# Patient Record
Sex: Male | Born: 1940
Health system: Southern US, Community
[De-identification: ages and names within clinical notes are randomized; demographics above are authoritative.]

## PROBLEM LIST (undated history)

## (undated) DIAGNOSIS — Z87442 Personal history of urinary calculi: Secondary | ICD-10-CM

## (undated) DIAGNOSIS — H269 Unspecified cataract: Secondary | ICD-10-CM

## (undated) DIAGNOSIS — M810 Age-related osteoporosis without current pathological fracture: Secondary | ICD-10-CM

## (undated) DIAGNOSIS — E785 Hyperlipidemia, unspecified: Secondary | ICD-10-CM

## (undated) DIAGNOSIS — D649 Anemia, unspecified: Secondary | ICD-10-CM

## (undated) DIAGNOSIS — R21 Rash and other nonspecific skin eruption: Secondary | ICD-10-CM

## (undated) DIAGNOSIS — S60412A Abrasion of right middle finger, initial encounter: Secondary | ICD-10-CM

## (undated) DIAGNOSIS — M1990 Unspecified osteoarthritis, unspecified site: Secondary | ICD-10-CM

## (undated) DIAGNOSIS — I10 Essential (primary) hypertension: Secondary | ICD-10-CM

## (undated) DIAGNOSIS — K219 Gastro-esophageal reflux disease without esophagitis: Secondary | ICD-10-CM

## (undated) DIAGNOSIS — T7840XA Allergy, unspecified, initial encounter: Secondary | ICD-10-CM

## (undated) DIAGNOSIS — H409 Unspecified glaucoma: Secondary | ICD-10-CM

## (undated) DIAGNOSIS — L089 Local infection of the skin and subcutaneous tissue, unspecified: Secondary | ICD-10-CM

## (undated) DIAGNOSIS — E119 Type 2 diabetes mellitus without complications: Secondary | ICD-10-CM

## (undated) DIAGNOSIS — N189 Chronic kidney disease, unspecified: Secondary | ICD-10-CM

## (undated) HISTORY — DX: Age-related osteoporosis without current pathological fracture: M81.0

## (undated) HISTORY — DX: Type 2 diabetes mellitus without complications: E11.9

## (undated) HISTORY — DX: Unspecified glaucoma: H40.9

## (undated) HISTORY — PX: APPENDECTOMY: SHX54

## (undated) HISTORY — PX: CYSTOSCOPY/RETROGRADE/URETEROSCOPY/STONE EXTRACTION WITH BASKET: SHX5317

## (undated) HISTORY — PX: EYE SURGERY: SHX253

## (undated) HISTORY — DX: Allergy, unspecified, initial encounter: T78.40XA

## (undated) HISTORY — DX: Unspecified cataract: H26.9

## (undated) HISTORY — DX: Essential (primary) hypertension: I10

---

## 2008-12-21 ENCOUNTER — Ambulatory Visit: Payer: Self-pay | Admitting: Family Medicine

## 2008-12-21 DIAGNOSIS — Z794 Long term (current) use of insulin: Secondary | ICD-10-CM

## 2008-12-21 DIAGNOSIS — E1165 Type 2 diabetes mellitus with hyperglycemia: Secondary | ICD-10-CM | POA: Insufficient documentation

## 2008-12-21 DIAGNOSIS — IMO0002 Reserved for concepts with insufficient information to code with codable children: Secondary | ICD-10-CM | POA: Insufficient documentation

## 2008-12-21 LAB — CONVERTED CEMR LAB
Basophils Relative: 1 % (ref 0–1)
Bilirubin, Direct: 0.1 mg/dL (ref 0.0–0.3)
CO2: 28 meq/L (ref 19–32)
Calcium: 9.6 mg/dL (ref 8.4–10.5)
Chloride: 104 meq/L (ref 96–112)
Glucose, Bld: 78 mg/dL (ref 70–99)
Hemoglobin: 14.2 g/dL (ref 13.0–17.0)
Hgb A1c MFr Bld: 9.7 %
LDL Cholesterol: 77 mg/dL (ref 0–99)
MCHC: 32.6 g/dL (ref 30.0–36.0)
Monocytes Absolute: 0.4 10*3/uL (ref 0.1–1.0)
Monocytes Relative: 10 % (ref 3–12)
Neutro Abs: 1.4 10*3/uL — ABNORMAL LOW (ref 1.7–7.7)
PSA: 0.66 ng/mL (ref 0.10–4.00)
Potassium: 4.6 meq/L (ref 3.5–5.3)
RBC: 4.84 M/uL (ref 4.22–5.81)
Sodium: 143 meq/L (ref 135–145)
TSH: 1.573 microintl units/mL (ref 0.350–4.500)
Total Bilirubin: 0.5 mg/dL (ref 0.3–1.2)
Total CHOL/HDL Ratio: 2.5
VLDL: 14 mg/dL (ref 0–40)

## 2008-12-23 ENCOUNTER — Encounter: Payer: Self-pay | Admitting: Family Medicine

## 2009-01-02 DIAGNOSIS — I10 Essential (primary) hypertension: Secondary | ICD-10-CM | POA: Insufficient documentation

## 2009-01-20 ENCOUNTER — Ambulatory Visit: Payer: Self-pay | Admitting: Family Medicine

## 2009-01-20 DIAGNOSIS — N529 Male erectile dysfunction, unspecified: Secondary | ICD-10-CM | POA: Insufficient documentation

## 2009-01-20 HISTORY — DX: Male erectile dysfunction, unspecified: N52.9

## 2009-01-20 LAB — CONVERTED CEMR LAB: Glucose, Bld: 238 mg/dL

## 2009-02-02 ENCOUNTER — Telehealth: Payer: Self-pay | Admitting: Family Medicine

## 2009-02-10 ENCOUNTER — Encounter: Payer: Self-pay | Admitting: Family Medicine

## 2009-07-19 ENCOUNTER — Ambulatory Visit: Payer: Self-pay | Admitting: Family Medicine

## 2009-07-19 DIAGNOSIS — M79609 Pain in unspecified limb: Secondary | ICD-10-CM | POA: Insufficient documentation

## 2009-07-19 LAB — CONVERTED CEMR LAB
Blood Glucose, Fasting: 148 mg/dL
OCCULT 1: NEGATIVE

## 2009-07-20 DIAGNOSIS — H6120 Impacted cerumen, unspecified ear: Secondary | ICD-10-CM | POA: Insufficient documentation

## 2009-07-20 LAB — CONVERTED CEMR LAB
BUN: 15 mg/dL (ref 6–23)
Creatinine, Urine: 121 mg/dL
Hgb A1c MFr Bld: 9.3 % — ABNORMAL HIGH (ref 4.6–6.1)
Microalb Creat Ratio: 17.1 mg/g (ref 0.0–30.0)
Microalb, Ur: 2.07 mg/dL — ABNORMAL HIGH (ref 0.00–1.89)
Potassium: 5 meq/L (ref 3.5–5.3)
Sodium: 138 meq/L (ref 135–145)

## 2009-08-05 ENCOUNTER — Ambulatory Visit: Payer: Self-pay | Admitting: Orthopedic Surgery

## 2009-08-05 DIAGNOSIS — G5600 Carpal tunnel syndrome, unspecified upper limb: Secondary | ICD-10-CM

## 2009-08-05 HISTORY — DX: Carpal tunnel syndrome, unspecified upper limb: G56.00

## 2009-08-27 ENCOUNTER — Ambulatory Visit: Payer: Self-pay | Admitting: Family Medicine

## 2009-10-04 ENCOUNTER — Ambulatory Visit: Payer: Self-pay | Admitting: Orthopedic Surgery

## 2009-10-08 ENCOUNTER — Encounter (INDEPENDENT_AMBULATORY_CARE_PROVIDER_SITE_OTHER): Payer: Self-pay | Admitting: *Deleted

## 2009-10-27 ENCOUNTER — Ambulatory Visit: Payer: Self-pay | Admitting: Family Medicine

## 2009-10-29 LAB — CONVERTED CEMR LAB
CO2: 27 meq/L (ref 19–32)
Calcium: 9.3 mg/dL (ref 8.4–10.5)
Chloride: 101 meq/L (ref 96–112)
Potassium: 4.8 meq/L (ref 3.5–5.3)
Sodium: 138 meq/L (ref 135–145)

## 2009-11-03 ENCOUNTER — Encounter: Payer: Self-pay | Admitting: Orthopedic Surgery

## 2009-11-09 ENCOUNTER — Encounter: Payer: Self-pay | Admitting: Family Medicine

## 2009-11-10 LAB — CONVERTED CEMR LAB
BUN: 23 mg/dL (ref 6–23)
CO2: 28 meq/L (ref 19–32)
Chloride: 101 meq/L (ref 96–112)
Potassium: 4.7 meq/L (ref 3.5–5.3)

## 2009-11-24 ENCOUNTER — Ambulatory Visit: Payer: Self-pay | Admitting: Orthopedic Surgery

## 2010-01-21 ENCOUNTER — Ambulatory Visit: Payer: Self-pay | Admitting: Family Medicine

## 2010-01-24 ENCOUNTER — Encounter: Payer: Self-pay | Admitting: Family Medicine

## 2010-01-31 ENCOUNTER — Encounter: Payer: Self-pay | Admitting: Family Medicine

## 2010-01-31 LAB — CONVERTED CEMR LAB
BUN: 24 mg/dL — ABNORMAL HIGH (ref 6–23)
Chloride: 101 meq/L (ref 96–112)
Glucose, Bld: 120 mg/dL — ABNORMAL HIGH (ref 70–99)
Hgb A1c MFr Bld: 8.5 % — ABNORMAL HIGH (ref <5.7)
Potassium: 4.5 meq/L (ref 3.5–5.3)

## 2010-03-29 ENCOUNTER — Telehealth: Payer: Self-pay | Admitting: Family Medicine

## 2010-05-10 NOTE — Assessment & Plan Note (Signed)
Summary: F UP   Vital Signs:  Patient profile:   70 year old male Height:      66 inches Weight:      128.25 pounds BMI:     20.77 O2 Sat:      98 % Pulse rate:   73 / minute Pulse rhythm:   regular Resp:     16 per minute BP sitting:   114 / 78  (left arm) BP standing:   120 / 80  (right arm) Cuff size:   regular  Vitals Entered By: Everitt Amber LPN (October 27, 2009 10:57 AM) CC: Follow up chronic problems   Primary Care Provider:  Dr. Lodema Hong  CC:  Follow up chronic problems.  History of Present Illness: Reports  that he has been   doing well. Denies recent fever or chills. Denies sinus pressure, nasal congestion , ear pain or sore throat. Denies chest congestion, or cough productive of sputum. Denies chest pain, palpitations, PND, orthopnea or leg swelling. Denies abdominal pain, nausea, vomitting, diarrhea or constipation. Denies change in bowel movements or bloody stool. Denies dysuria , frequency, incontinence or hesitancy. Denies  joint pain, swelling, or reduced mobility. Denies headaches, vertigo, seizures. Denies depression, anxiety or insomnia. Denies  rash, lesions, or itch.     Allergies (verified): No Known Drug Allergies  Review of Systems      See HPI Eyes:  Denies discharge, eye pain, and red eye. Endo:  Denies cold intolerance, excessive hunger, excessive thirst, excessive urination, heat intolerance, polyuria, and weight change; tests twice daily mornings range 70 to 140, evening sugar between 200 to 220, this is premeal. Heme:  Denies abnormal bruising and bleeding. Allergy:  Denies hives or rash and itching eyes.  Physical Exam  General:  Well-developed,well-nourished,in no acute distress; alert,appropriate and cooperative throughout examination. HEENT: No facial asymmetry,  EOMI, No sinus tenderness, TM's clear, oropharynx  pink and moist.   Chest: Clear to auscultation bilaterally.  CVS: S1, S2, No murmurs, No S3.   Abd: Soft, Nontender.    MS: Adequate ROM spine, hips, shoulders and knees.  Ext: No edema.   CNS: CN 2-12 intact, power tone and sensation normal throughout.   Skin: Intact, no visible lesions or rashes.  Psych: Good eye contact, normal affect.  Memory intact, not anxious or depressed appearing.    Impression & Recommendations:  Problem # 1:  HYPERTENSION (ICD-401.9) Assessment Unchanged  His updated medication list for this problem includes:    Lotensin 10 Mg Tabs (Benazepril hcl) .Marland Kitchen... Take 1 tablet by mouth once a day  BP today: 114/78 Prior BP: 120/78 (08/27/2009)  Labs Reviewed: K+: 5.0 (07/19/2009) Creat: : 1.15 (07/19/2009)   Chol: 151 (12/21/2008)   HDL: 60 (12/21/2008)   LDL: 77 (12/21/2008)   TG: 71 (12/21/2008)  Problem # 2:  DIABETES MELLITUS, TYPE II (ICD-250.00) Assessment: Comment Only  The following medications were removed from the medication list:    Metformin Hcl 750 Mg Xr24h-tab (Metformin hcl) .Marland Kitchen..Marland Kitchen Two tablets once daily His updated medication list for this problem includes:    Lotensin 10 Mg Tabs (Benazepril hcl) .Marland Kitchen... Take 1 tablet by mouth once a day    Humulin 70/30 70-30 % Susp (Insulin isophane & regular) .Marland Kitchen... 20 units in the morning, and 20 units in the evening    Aspir-low 81 Mg Tbec (Aspirin) .Marland Kitchen... Take 1 tablet by mouth once a day    Glipizide 10 Mg Xr24h-tab (Glipizide) .Marland Kitchen... Take 1 tablet by mouth once  a day    Glipizide 5 Mg Xr24h-tab (Glipizide) .Marland Kitchen... Take 1 tablet by mouth once a day take at the same time in themorning that you take the 10mg  tablet  Orders: T- Hemoglobin A1C ([id number])  Labs Reviewed: Creat: 1.15 (07/19/2009)     Last Eye Exam: glaucoma (03/31/2009) Reviewed HgBA1c results: 9.3 (07/19/2009)  9.7 (12/21/2008)  Problem # 3:  CERUMEN IMPACTION, BILATERAL (ICD-380.4) Assessment: Improved  Complete Medication List: 1)  Lotensin 10 Mg Tabs (Benazepril hcl) .... Take 1 tablet by mouth once a day 2)  Humulin 70/30 70-30 % Susp (Insulin  isophane & regular) .... 20 units in the morning, and 20 units in the evening 3)  Aspir-low 81 Mg Tbec (Aspirin) .... Take 1 tablet by mouth once a day 4)  Cialis 20 Mg Tabs (Tadalafil) .... One tab by mouth qd 5)  Glipizide 10 Mg Xr24h-tab (Glipizide) .... Take 1 tablet by mouth once a day 6)  Neurontin 100 Mg Caps (Gabapentin) .Marland Kitchen.. 1 by mouth tid 7)  Glipizide 5 Mg Xr24h-tab (Glipizide) .... Take 1 tablet by mouth once a day take at the same time in themorning that you take the 10mg  tablet  Other Orders: T-Basic Metabolic Panel [id number])  Patient Instructions: 1)  Please schedule a follow-up appointment in 3 months. 2)  Check your blood sugars regularly. If your readings are usually above 250 or below 70 you should contact our office. 3)  HBA1C and chem 7 today. 4)  No med changes Prescriptions: GLIPIZIDE 5 MG XR24H-TAB (GLIPIZIDE) Take 1 tablet by mouth once a day take at the same time in themorning that you take the 10mg  tablet  #30 x 3   Entered and Authorized by:   Syliva Overman MD   Signed by:   Syliva Overman MD on 10/29/2009   Method used:   Printed then faxed to ...       [facility] (retail)       [address]       [place], [name]  [address]       Ph: [phone]       Fax: [phone]   RxID:   [id number]

## 2010-05-10 NOTE — Assessment & Plan Note (Signed)
Summary: office visit   Vital Signs:  Patient profile:   70 year old male Height:      66 inches Weight:      137.25 pounds BMI:     22.23 O2 Sat:      99 % on Room air Pulse rate:   85 / minute Pulse rhythm:   regular Resp:     16 per minute BP sitting:   134 / 80  (left arm) Cuff size:   regular  Vitals Entered By: Mauricia Area CMA (January 21, 2010 11:48 AM)  O2 Flow:  Room air CC: follow up   Primary Care Thien Berka:  Dr. Lodema Hong  CC:  follow up.  History of Present Illness: Reports  that he has been doing well. Denies recent fever or chills. Denies sinus pressure, nasal congestion , ear pain or sore throat. Denies chest congestion, or cough productive of sputum. Denies chest pain, palpitations, PND, orthopnea or leg swelling. Denies abdominal pain, nausea, vomitting, diarrhea or constipation. Denies change in bowel movements or bloody stool. Denies dysuria , frequency, incontinence or hesitancy. Denies  joint pain, swelling, or reduced mobility. Denies headaches, vertigo, seizures. Denies depression, anxiety or insomnia. Denies  rash, lesions, or itch. pt denies symptoms of uncontrolled blod sugars. He states he has been testing more frequenly also.He aLSO REFUSES  the flu vacine.      Allergies (verified): No Known Drug Allergies  Review of Systems      See HPI Eyes:  Denies discharge, eye pain, and red eye. Endo:  Denies excessive thirst and excessive urination; pt neither has his log or meter, and the reliability of his history of tests is [place] questionable. Heme:  Denies abnormal bruising and bleeding. Allergy:  Denies hives or rash and itching eyes.  Physical Exam  General:  Well-developed,well-nourished,in no acute distress; alert,appropriate and cooperative throughout examination HEENT: No facial asymmetry,  EOMI, No sinus tenderness, TM's Clear, oropharynx  pink and moist.   Chest: Clear to auscultation bilaterally.  CVS: S1, S2, No murmurs, No S3.    Abd: Soft, Nontender.  MS: Adequate ROM spine, hips, shoulders and knees.  Ext: No edema.   CNS: CN 2-12 intact, power tone and sensation normal throughout.   Skin: Intact, no visible lesions or rashes.  Psych: Good eye contact, normal affect.  Memory intact, not anxious or depressed appearing.     Impression & Recommendations:  Problem # 1:  ERECTILE DYSFUNCTION, ORGANIC (ICD-607.84) Assessment Unchanged  His updated medication list for this problem includes:    Cialis 20 Mg Tabs (Tadalafil) ..... One tab by mouth once daily.  Problem # 2:  HYPERTENSION (ICD-401.9) Assessment: Unchanged  His updated medication list for this problem includes:    Lotensin 10 Mg Tabs (Benazepril hcl) .Marland Kitchen... Take 1 tablet by mouth once a day  Orders: T-Basic Metabolic Panel ([id number])  BP today: 134/80 Prior BP: 120/80 (10/27/2009)  Labs Reviewed: K+: 4.7 (11/09/2009) Creat: : 1.63 (11/09/2009)   Chol: 151 (12/21/2008)   HDL: 60 (12/21/2008)   LDL: 77 (12/21/2008)   TG: 71 (12/21/2008)  Problem # 3:  DIABETES MELLITUS, TYPE II (ICD-250.00) Assessment: Unchanged  His updated medication list for this problem includes:    Lotensin 10 Mg Tabs (Benazepril hcl) .Marland Kitchen... Take 1 tablet by mouth once a day    Humulin 70/30 70-30 % Susp (Insulin isophane & regular) .Marland Kitchen... 20 units in the morning, and 20 units in the evening    Aspir-low 81 Mg Tbec (Aspirin) .Marland KitchenMarland KitchenMarland KitchenMarland Kitchen  Take 1 tablet by mouth once a day    Glipizide 10 Mg Xr24h-tab (Glipizide) .Marland Kitchen... Take 1 tablet by mouth once a day    Glipizide 5 Mg Xr24h-tab (Glipizide) .Marland Kitchen... Take 1 tablet by mouth once a day take at the same time in themorning that you take the 10mg  tablet Patient advised to reduce carbs and sweets, commit to regular physical activity, take meds as prescribed, test blood sugars as directed, and attempt to lose weight , to improve blood sugar control.  Orders: T- Hemoglobin A1C ([id number])  Labs Reviewed: Creat: 1.63 (11/09/2009)       Last Eye Exam: glaucoma (03/31/2009) Reviewed HgBA1c results: 8.3 (10/27/2009)  9.3 (07/19/2009)  Complete Medication List: 1)  Lotensin 10 Mg Tabs (Benazepril hcl) .... Take 1 tablet by mouth once a day 2)  Humulin 70/30 70-30 % Susp (Insulin isophane & regular) .... 20 units in the morning, and 20 units in the evening 3)  Aspir-low 81 Mg Tbec (Aspirin) .... Take 1 tablet by mouth once a day 4)  Cialis 20 Mg Tabs (Tadalafil) .... One tab by mouth once daily. 5)  Glipizide 10 Mg Xr24h-tab (Glipizide) .... Take 1 tablet by mouth once a day 6)  Neurontin 100 Mg Caps (Gabapentin) .Marland Kitchen.. 1 by mouth tid 7)  Glipizide 5 Mg Xr24h-tab (Glipizide) .... Take 1 tablet by mouth once a day take at the same time in themorning that you take the 10mg  tablet  Patient Instructions: 1)  Please schedule a follow-up appointment in 3 to 3.5 months. 2)  BMP prior to visit, ICD-9: 3)  HbgA1C prior to visit, ICD-9: 4)  Check your blood sugars regularly. If your readings are usually above 250 or below 70 you should contact our office.

## 2010-05-10 NOTE — Assessment & Plan Note (Signed)
Summary: 6 WK RE-CK RT HAND/SEC HORIZ/CAF   Visit Type:  Follow-up Referring Provider:  Dr. Lodema Hong Primary Provider:  Dr. Lodema Hong  CC:  recheck right hand.  History of Present Illness: I saw Jacob Barnes in the office today for a followup visit.  He is a 70 years old man with the complaint of: carpal tunnel symptoms in the RIGHT hand  We treated him with  Neurontin 100mg  three times a day, bracing and OTC vitamin B6 for CTS.  This is a 6 week followup  Still some numbness and swelling in rt thumb.  His main problem he says that he can't pick things up the hand just feels a little bit weak and he has pain around his thumb, he also complained about nodules which are Heberden's notes around the IP joint of the thumb which I told Be removed  I think we should go ahead and do I nerve conduction study just to make sure we are dealing with carpal tunnel syndrome and not arthritis he is agreeable to that  Recommend nerve conduction study continue bracing follow up 3 weeks     Allergies: No Known Drug Allergies   Impression & Recommendations:  Problem # 1:  CARPAL TUNNEL SYNDROME (ICD-354.0) Assessment Comment Only further testing needed Orders: Est. Patient Level II ([address])  Patient Instructions: 1)  NCS UPPER EXTREMITIES/CTS  2)  continue bracing  3)  f/u in 3 weeks

## 2010-05-10 NOTE — Letter (Signed)
Summary: med review sheet  med review sheet   Imported By: Rudene Anda 01/24/2010 16:46:30  _____________________________________________________________________  External Attachment:    Type:   Image     Comment:   External Document

## 2010-05-10 NOTE — Assessment & Plan Note (Signed)
Summary: physical   Vital Signs:  Patient profile:   70 year old male Height:      65 inches Weight:      137 pounds BMI:     22.88 O2 Sat:      97 % Pulse rate:   65 / minute Pulse rhythm:   regular Resp:     16 per minute BP sitting:   120 / 72  (left arm) Cuff size:   regular  Vitals Entered By: Everitt Amber LPN (July 19, 2009 9:01 AM) CC: states he has no feeling in his fingertips in his right side   CC:  states he has no feeling in his fingertips in his right side.  History of Present Illness: Reports  that he has been doing fairly well. He unfortunately is still eating irregularly,eating too much candy, and reports blood sugar s often over 250 Denies recent fever or chills. Denies sinus pressure, nasal congestion , ear pain or sore throat. Denies chest congestion, or cough productive of sputum. Denies chest pain, palpitations, PND, orthopnea or leg swelling. Denies abdominal pain, nausea, vomitting, diarrhea or constipation. Denies change in bowel movements or bloody stool. Denies dysuria , frequency, incontinence or hesitancy. Reports  joint pain, swelling, and  reduced mobility affecting the fingers Denies headaches, vertigo, seizures. Denies depression, anxiety or insomnia. Denies  rash,  or itch.Recent trauma to thumb, nail now black and blue. He c/o numbness in the fingers of his right hand with very poor grip, and reduced strength in his thumb, states he is having difficulty with tying shoe laces.     Current Medications (verified): 1)  Lotensin 10 Mg Tabs (Benazepril Hcl) .... Take 1 Tablet By Mouth Once A Day 2)  Humulin 70/30 70-30 % Susp (Insulin Isophane & Regular) .... 20 Units in The Morning, and 20 Units in The Evening 3)  Glucotrol Xl 5 Mg Xr24h-Tab (Glipizide) .... Take One Tab Every Morning Before Breakfast 4)  Aspir-Low 81 Mg Tbec (Aspirin) .... Take 1 Tablet By Mouth Once A Day 5)  Cialis 20 Mg Tabs (Tadalafil) .... One Tab By Mouth  Qd  Allergies (verified): No Known Drug Allergies  Past History:  Past medical, surgical, family and social histories (including risk factors) reviewed, and no changes noted (except as noted below).  Past Medical History: hypertension  since 2008 Diabetes x 1979 Glaucom  in right eye since 2009  Past Surgical History: Reviewed history from 12/21/2008 and no changes required. Appendectomy-age 1 bilateral cataract surgery 2000  Family History: Reviewed history from 12/21/2008 and no changes required. Mother-deceased in 03-22-2009 at age 41 Father-deceased-Diabetic 1 sisters-healthy 2 brothers-Diabetic  Social History: Reviewed history from 12/21/2008 and no changes required. 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 Eyes:  Denies blurring, discharge, eye pain, and red eye. ENT:  Complains of decreased hearing. MS:  Complains of joint pain, muscle weakness, and stiffness; weak right griop  and swollen joint on right thumb. Derm:  Complains of changes in nail beds, dryness, insect bite(s), itching, lesion(s), and poor wound healing; 2 weeks ago squashed right 5th finger in a stove nowe black and blue , swollen, non tender no drainage puklled off ticjk on right leg yesterday area now red.  Neuro:  Complains of numbness; progressive numbness in fingers of right hand with joint deformity of the thumb, cannot tie shoes for approx 1 year, pos tinnels. Psych:  Denies anxiety and easily angered. Endo:  Complains of excessive thirst and excessive urination; uncontrolled blood sugars, from 80's to opvwer 400, eating is inconsuitent, and too much sweet. Heme:  Denies abnormal bruising and bleeding. Allergy:  Complains of seasonal  allergies; denies hives or rash, itching eyes, persistent infections, and sneezing; mild.  Physical Exam  General:  Well-developed,well-nourished,in no acute distress; alert,appropriate and cooperative throughout examination Head:  Normocephalic and atraumatic without obvious abnormalities. No apparent alopecia or balding. Eyes:  vision grossly intact, pupils equal, pupils round, and pupils reactive to light.   Ears:  Bilateral cerumen impaction Nose:  no external deformity, no external erythema, and no nasal discharge.   Mouth:  pharynx pink and moist and teeth missing.   Neck:  No deformities, masses, or tenderness noted.no adenopathy or JVD Chest Wall:  No deformities, masses, tenderness or gynecomastia noted. Breasts:  No masses or gynecomastia noted Lungs:  Normal respiratory effort, chest expands symmetrically. Lungs are clear to auscultation, no crackles or wheezes. Heart:  Normal rate and regular rhythm. S1 and S2 normal without gallop, murmur, click, rub or other extra sounds. Abdomen:  Bowel sounds positive,abdomen soft and non-tender without masses, organomegaly or hernias noted. Rectal:  No external abnormalities noted. Normal sphincter tone. No rectal masses or tenderness.Guaic neg stool Genitalia:  Testes bilaterally descended without nodularity, tenderness or masses. No scrotal masses or lesions. No penis lesions or urethral discharge. Prostate:  Prostate gland firm and smooth, no enlargement, nodularity, tenderness, mass, asymmetry or induration. Msk:  No deformity or scoliosis noted of thoracic or lumbar spine.  Wasting of right thenar muscles, with positive tinnel's sign Pulses:  R and L carotid,radial,femoral,dorsalis pedis and posterior tibial pulses are full and equal bilaterally Extremities:  No clubbing, cyanosis, edema, or deformity noted with normal full range of motion of all joints. joints of fingers aenlarged and deformed esp the thumbs  Neurologic:  alert & oriented  X3 and cranial nerves II-XII intact.  Decreased power in right hand , and decreased sensation in rightr finger tips  Skin:  erythematous warm area on right leg lateral aspecrt, dia approx 3 inches  Diabetes Management Exam:    Foot Exam (with socks and/or shoes not present):       Sensory-Monofilament:          Left foot: diminished          Right foot: diminished       Inspection:          Left foot: normal          Right foot: normal       Nails:          Left foot: fungal infection          Right foot: fungal infection    Eye Exam:       Eye Exam done elsewhere          Date: 03/31/2009          Results: glaucoma          Done by: dr Onalee Hua   Impression & Recommendations:  Problem # 1:  HYPERTENSION (ICD-401.9) Assessment Unchanged  His updated medication list for this problem includes:    Lotensin 10 Mg Tabs (Benazepril hcl) .Marland Kitchen... Take 1 tablet by mouth once a day  Orders: T-Basic Metabolic  Panel [id number])  BP today: 120/72 Prior BP: 120/64 (01/20/2009)  Labs Reviewed: K+: 4.6 (12/21/2008) Creat: : 1.09 (12/21/2008)   Chol: 151 (12/21/2008)   HDL: 60 (12/21/2008)   LDL: 77 (12/21/2008)   TG: 71 (12/21/2008)  Problem # 2:  DIABETES MELLITUS, TYPE II (ICD-250.00) Assessment: Unchanged  The following medications were removed from the medication list:    Glucotrol Xl 5 Mg Xr24h-tab (Glipizide) .Marland Kitchen... Take one tab every morning before breakfast His updated medication list for this problem includes:    Lotensin 10 Mg Tabs (Benazepril hcl) .Marland Kitchen... Take 1 tablet by mouth once a day    Humulin 70/30 70-30 % Susp (Insulin isophane & regular) .Marland Kitchen... 20 units in the morning, and 20 units in the evening    Aspir-low 81 Mg Tbec (Aspirin) .Marland Kitchen... Take 1 tablet by mouth once a day    Glipizide 10 Mg Xr24h-tab (Glipizide) .Marland Kitchen... Take 1 tablet by mouth once a day    Metformin Hcl 500 Mg Xr24h-tab (Metformin hcl) .Marland Kitchen... Take 1 tablet by mouth two times a day  Orders: Glucose, (CBG)  ([id number]) T- Hemoglobin A1C ([id number]) T-Urine Microalbumin w/creat. ratio [id number])  Problem # 3:  HAND PAIN, RIGHT (ICD-729.5) Assessment: Deteriorated  Orders: Orthopedic Referral (Ortho), pt likely has a combination of diabetic neuropathy, carpal tunnel and rheumatoid arthritis  Problem # 4:  CERUMEN IMPACTION, BILATERAL (ICD-380.4) Assessment: Comment Only pt to have ear irrigation on return visit in 6 weeks, he is to use wax softener prior to the visit  Complete Medication List: 1)  Lotensin 10 Mg Tabs (Benazepril hcl) .... Take 1 tablet by mouth once a day 2)  Humulin 70/30 70-30 % Susp (Insulin isophane & regular) .... 20 units in the morning, and 20 units in the evening 3)  Aspir-low 81 Mg Tbec (Aspirin) .... Take 1 tablet by mouth once a day 4)  Cialis 20 Mg Tabs (Tadalafil) .... One tab by mouth qd 5)  Doxycycline Hyclate 100 Mg Caps (Doxycycline hyclate) .... Take 1 tablet by mouth two times a day 6)  Glipizide 10 Mg Xr24h-tab (Glipizide) .... Take 1 tablet by mouth once a day 7)  Metformin Hcl 500 Mg Xr24h-tab (Metformin hcl) .... Take 1 tablet by mouth two times a day  Other Orders: Hemoccult Guaiac-1 spec.(in office) ([id number])  Patient Instructions: 1)  F/u in 6 weeks 2)  Check your blood sugars regularly. If your readings are usually above 250 or below 70 you should contact our office.pla test twice daily and record 3)  HbgA1C prior to visit, ICD-9:  today 4)  Urine Microalbumin prior to visit, ICD-9: 5)  BMP prior to visit, ICD-9 6)  Med will be sent in for the inflamed area on your leg where you were bitten. 7)  You need to use a wax softener for5 days before your next visit, they have wax and will need to be flushed. 8)  itis vital that you reduce sweets, cake and icecream 9)  You will be referred to ortho about the numbnees and weakness in your right hand esp the thumb Prescriptions: METFORMIN HCL 500 MG XR24H-TAB (METFORMIN HCL) Take 1 tablet by mouth  two times a day  #60 x 3   Entered and Authorized by:   Syliva Overman MD   Signed by:   Syliva Overman MD on 07/20/2009   Method used:   Electronically to        [facility] (retail)       [id number]       [place], [name]  [address]       Ph: [phone]       Fax: [phone]   RxID:   [id number] GLIPIZIDE 10 MG XR24H-TAB (GLIPIZIDE) Take 1 tablet by mouth once a day  #30 x 3   Entered and Authorized by:   Syliva Overman MD   Signed by:   Syliva Overman MD on 07/20/2009   Method used:   Electronically to        [facility] (retail)       [address]       [place], [name]  [address]       Ph: [phone]       Fax: [phone]   RxID:   [id number] DOXYCYCLINE HYCLATE 100 MG CAPS (DOXYCYCLINE HYCLATE) Take 1 tablet by mouth two times a day  #14 x 0   Entered and Authorized by:   Syliva Overman MD   Signed by:   Syliva Overman MD on 07/19/2009   Method used:   Electronically to        [facility] (retail)       [address]       [place], [name]  [address]       Ph: [phone]       Fax: [phone]   RxID:   [id number]   Laboratory Results   Blood Tests     Glucose (fasting): 148 mg/dL   (Normal Range: 07-371)    Stool - Occult Blood Hemmoccult #1: negative Date: 07/19/2009 Comments: [id number] 9R 8/11 118 10 12

## 2010-05-10 NOTE — Assessment & Plan Note (Signed)
Summary: rt hand pain needs xr/sec hor/simpson/bsf   Vital Signs:  Patient profile:   70 year old male Height:      66 inches Weight:      135 pounds Pulse rate:   64 / minute Resp:     16 per minute  Vitals Entered By: Fuller Canada MD (August 05, 2009 9:45 AM)  Visit Type:  new patient Referring Provider:  Dr. Lodema Hong Primary Provider:  Dr. Lodema Hong  CC:  right hand numbness.  History of Present Illness: 70 years old African American male who complains of numbness in his RIGHT hand for 6 months.  Does not describe pain. Chest numbness. No injury. No previous treatment. Does say he has some swelling and some weakness.    Meds in EMR.  Allergies (verified): No Known Drug Allergies  Social History: Retired x 3 years , from the [employer], [employer] Married for 15 years, second marriage first wife died Never Smoked Alcohol use-no Drug use-no no caffeine use Regular exercise-yes (is physically active) three adult children and  4stepchildren  Review of Systems Constitutional:  Denies weight loss, weight gain, fever, chills, and fatigue. Cardiovascular:  Denies chest pain, palpitations, fainting, and murmurs. Respiratory:  Denies short of breath, wheezing, couch, tightness, pain on inspiration, and snoring . Gastrointestinal:  Denies heartburn, nausea, vomiting, diarrhea, constipation, and blood in your stools. Genitourinary:  Denies frequency, urgency, difficulty urinating, painful urination, flank pain, and bleeding in urine. Neurologic:  Complains of numbness and tingling; denies unsteady gait, dizziness, tremors, and seizure. Musculoskeletal:  Denies joint pain, swelling, instability, stiffness, redness, heat, and muscle pain. Endocrine:  Denies excessive thirst, exessive urination, and heat or cold intolerance. Psychiatric:  Denies nervousness, depression, anxiety, and hallucinations. Skin:  Complains of poor healing; denies changes in the skin, rash, itching,  and redness. HEENT:  Denies blurred or double vision, eye pain, redness, and watering. Immunology:  Denies seasonal allergies, sinus problems, and allergic to bee stings. Hemoatologic:  Denies easy bleeding and brusing.  Physical Exam  Additional Exam:  GEN: well developed, well nourished, normal grooming and hygiene, no deformity and normal body habitus.   CDV: pulses are normal, no edema, no erythema. no tenderness  Lymph: normal lymph nodes   Skin: no rashes, skin lesions or open sores   NEURO: soft touch and sharp touch sensation, tests are normal and the RIGHT upper extremity. He has good reflexes in that arm. He has tenderness over the carpal tunnel.  Psyche: awake, alert and oriented. Mood normal   Gait: normal   Hand is otherwise, normal. In terms of its appearance.  He has good range of motion and normal grip strength compared to his LEFT hand. There is no instability. He has a positive wrist compression test for carpal tunnel.    Impression & Recommendations:  Problem # 1:  CARPAL TUNNEL SYNDROME (ICD-354.0) Assessment New  recommend nonoperative treatment with a brace for 6 weeks, the 600 mg twice a day for 6 weeks and Neurontin 100 mg 3 times a day for 6 weeks.  reexamination next time  Orders: New Patient Level III ([address])  Medications Added to Medication List This Visit: 1)  Neurontin 100 Mg Caps (Gabapentin) .Marland Kitchen.. 1 by mouth tid  Patient Instructions: 1)  You have CARPAL TUNNEL SYNDROME 2)  Wear brace for 6 weeks  3)  take medication for 6 weeks [neurontin 100 mg three times a day] 4)  and also take [Vitamin B6 100 mg two times a day  for 6 weeks] 5)  Return  in 6 weeks  Prescriptions: NEURONTIN 100 MG CAPS (GABAPENTIN) 1 by mouth tid  #90 x 1   Entered and Authorized by:   Fuller Canada MD   Signed by:   Fuller Canada MD on 08/05/2009   Method used:   Print then Give to Patient   RxID:   [id number]

## 2010-05-10 NOTE — Miscellaneous (Signed)
  Clinical Lists Changes  Medications: Removed medication of GLIPIZIDE 5 MG XR24H-TAB (GLIPIZIDE) Take 1 tablet by mouth once a day take at the same time in themorning that you take the 10mg  tablet Added new medication of GLIPIZIDE 10 MG XR24H-TAB (GLIPIZIDE) Take 1 tablet by mouth two times a day - Signed Removed medication of GLIPIZIDE 10 MG XR24H-TAB (GLIPIZIDE) Take 1 tablet by mouth once a day Rx of GLIPIZIDE 10 MG XR24H-TAB (GLIPIZIDE) Take 1 tablet by mouth two times a day;  #60 x 4;  Signed;  Entered by: Syliva Overman MD;  Authorized by: Syliva Overman MD;  Method used: Historical    Prescriptions: GLIPIZIDE 10 MG XR24H-TAB (GLIPIZIDE) Take 1 tablet by mouth two times a day  #60 x 4   Entered and Authorized by:   Syliva Overman MD   Signed by:   Syliva Overman MD on 01/31/2010   Method used:   Historical   RxID:   [id number]

## 2010-05-10 NOTE — Miscellaneous (Signed)
Summary: ncs order  Clinical Lists Changes  Orders: Added new Referral order of Neurology Referral (Neuro) - Signed  Appended Document: ncs order faxed to [facility]

## 2010-05-10 NOTE — Assessment & Plan Note (Signed)
Summary: RE-CK CTS RT/SEC HORIZ/CAF   Visit Type:  Follow-up Referring Provider:  Dr. Lodema Hong Primary Provider:  Dr. Lodema Hong  CC:  cts.  History of Present Illness: I saw Jacob Barnes in the office today for a followup visit.  He is a 70 years old man with the complaint of:CTS right hand.  Today is recheck and go over NCS uppers from 11/02/09.  Has had Brace he does not wear every night, OTC B6 ad Neurontin 100mg  three times a day, he stopped medication a month ago, said he did not get relief.  No injections.  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  I've advised him that surgical treatment may or may not help his problem.  He said he would like to wait and see if things get better by themselves which was his second option      Allergies: No Known Drug Allergies   Impression & Recommendations:  Problem # 1:  CARPAL TUNNEL SYNDROME (ICD-354.0) Assessment Unchanged  Orders: Est. Patient Level II ([address])  Patient Instructions: 1)  Please schedule a follow-up appointment as needed.

## 2010-05-10 NOTE — Assessment & Plan Note (Signed)
Summary: f up   Vital Signs:  Patient profile:   70 year old male Height:      66 inches Weight:      138 pounds BMI:     22.35 O2 Sat:      98 % Pulse rate:   75 / minute Pulse rhythm:   regular Resp:     16 per minute BP sitting:   120 / 78  (left arm) Cuff size:   regular  Vitals Entered By: Everitt Amber LPN (Aug 27, 2009 10:13 AM) CC: Follow up chronic problems Comments patient in today for bilateral ear irrigation. both ears cleared and pt tolerated procedure well. Adella Hare LPN  Aug 27, 2009 12:03 PM    Primary Care Provider:  Dr. Lodema Hong  CC:  Follow up chronic problems.  History of Present Illness: Reports  that they are doing well. Denies recent fever or chills. Denies sinus pressure, nasal congestion , ear pain or sore throat. Denies chest congestion, or cough productive of sputum. Denies chest pain, palpitations, PND, orthopnea or leg swelling. Denies abdominal pain, nausea, vomitting, diarrhea or constipation. Denies change in bowel movements or bloody stool. Denies dysuria , frequency, incontinence or hesitancy. Denies  joint pain, swelling, or reduced mobility. Denies headaches, vertigo, seizures. Denies depression, anxiety or insomnia. Denies  rash, lesions, or itch. requests PPD placement since his wife is in health care deivery.    1   Current Medications (verified): 1)  Lotensin 10 Mg Tabs (Benazepril Hcl) .... Take 1 Tablet By Mouth Once A Day 2)  Humulin 70/30 70-30 % Susp (Insulin Isophane & Regular) .... 20 Units in The Morning, and 20 Units in The Evening 3)  Aspir-Low 81 Mg Tbec (Aspirin) .... Take 1 Tablet By Mouth Once A Day 4)  Cialis 20 Mg Tabs (Tadalafil) .... One Tab By Mouth Qd 5)  Glipizide 10 Mg Xr24h-Tab (Glipizide) .... Take 1 Tablet By Mouth Once A Day 6)  Neurontin 100 Mg Caps (Gabapentin) .Marland Kitchen.. 1 By Mouth Tid  Allergies (verified): No Known Drug Allergies  Review of Systems      See HPI General:  Denies chills and  fatigue. Eyes:  Denies blurring, discharge, itching, and red eye. Derm:  Denies rash. Neuro:  Denies difficulty with concentration, memory loss, numbness, seizures, sensation of room spinning, and tingling. Psych:  Denies anxiety and depression. Endo:  Denies excessive thirst, heat intolerance, and polyuria; blood sugars continue to fluctuate , with fasting sugars oftewn over 150. Heme:  Denies abnormal bruising and enlarge lymph nodes. Allergy:  Denies hives or rash and itching eyes.  Physical Exam  General:  Well-developed,well-nourished,in no acute distress; alert,appropriate and cooperative throughout examination. HEENT: No facial asymmetry,  EOMI, No sinus tenderness, TM's occluded by cerumenr, oropharynx  pink and moist.   Chest: Clear to auscultation bilaterally.  CVS: S1, S2, No murmurs, No S3.   Abd: Soft, Nontender.  MS: Adequate ROM spine, hips, shoulders and knees.  Ext: No edema.   CNS: CN 2-12 intact, power tone and sensation normal throughout.   Skin: Intact, no visible lesions or rashes.  Psych: Good eye contact, normal affect.  Memory intact, not anxious or depressed appearing.   Diabetes Management Exam:    Foot Exam (with socks and/or shoes not present):       Sensory-Monofilament:          Left foot: diminished          Right foot: diminished  Inspection:          Left foot: normal          Right foot: normal       Nails:          Left foot: thickened          Right foot: thickened   Impression & Recommendations:  Problem # 1:  CERUMEN IMPACTION, BILATERAL (ICD-380.4) Assessment Improved  successful wax removal in the office ,   Orders: Cerumen Impaction Removal (46962)  Problem # 2:  HYPERTENSION (ICD-401.9) Assessment: Unchanged  His updated medication list for this problem includes:    Lotensin 10 Mg Tabs (Benazepril hcl) .Marland Kitchen... Take 1 tablet by mouth once a day  BP today: 120/78 Prior BP: 120/72 (07/19/2009)  Labs Reviewed: K+: 5.0  (07/19/2009) Creat: : 1.15 (07/19/2009)   Chol: 151 (12/21/2008)   HDL: 60 (12/21/2008)   LDL: 77 (12/21/2008)   TG: 71 (12/21/2008)  Problem # 3:  DIABETES MELLITUS, TYPE II (ICD-250.00) Assessment: Comment Only  The following medications were removed from the medication list:    Metformin Hcl 500 Mg Xr24h-tab (Metformin hcl) .Marland Kitchen... Take 1 tablet by mouth two times a day His updated medication list for this problem includes:    Lotensin 10 Mg Tabs (Benazepril hcl) .Marland Kitchen... Take 1 tablet by mouth once a day    Humulin 70/30 70-30 % Susp (Insulin isophane & regular) .Marland Kitchen... 20 units in the morning, and 20 units in the evening    Aspir-low 81 Mg Tbec (Aspirin) .Marland Kitchen... Take 1 tablet by mouth once a day    Glipizide 10 Mg Xr24h-tab (Glipizide) .Marland Kitchen... Take 1 tablet by mouth once a day    Metformin Hcl 750 Mg Xr24h-tab (Metformin hcl) .Marland Kitchen..Marland Kitchen Two tablets once daily  Labs Reviewed: Creat: 1.15 (07/19/2009)   , pt unfortunately had not collected prescription for metformin, so his blood sugars remain uncontrolled  Last Eye Exam: glaucoma (03/31/2009) Reviewed HgBA1c results: 9.3 (07/19/2009)  9.7 (12/21/2008)  Problem # 4:  HYPERTENSION (ICD-401.9)  Complete Medication List: 1)  Lotensin 10 Mg Tabs (Benazepril hcl) .... Take 1 tablet by mouth once a day 2)  Humulin 70/30 70-30 % Susp (Insulin isophane & regular) .... 20 units in the morning, and 20 units in the evening 3)  Aspir-low 81 Mg Tbec (Aspirin) .... Take 1 tablet by mouth once a day 4)  Cialis 20 Mg Tabs (Tadalafil) .... One tab by mouth qd 5)  Glipizide 10 Mg Xr24h-tab (Glipizide) .... Take 1 tablet by mouth once a day 6)  Neurontin 100 Mg Caps (Gabapentin) .Marland Kitchen.. 1 by mouth tid 7)  Metformin Hcl 750 Mg Xr24h-tab (Metformin hcl) .... Two tablets once daily  Other Orders: TB Skin Test [phone]) Admin 1st Vaccine ([address]) Admin 1st Vaccine [facility]) [phone])  Patient Instructions: 1)  F/U week of July 18 2)  You will get a script for the  metformin, I am glad that you are doing better. 3)  pls get a blood test the week of July 11, HBA1C and chem7 Prescriptions: METFORMIN HCL 750 MG XR24H-TAB (METFORMIN HCL) two tablets once daily  #60 x 3   Entered and Authorized by:   Syliva Overman MD   Signed by:   Syliva Overman MD on 08/27/2009   Method used:   Printed then faxed to ...       [facility] (retail)       [address]  [place], [name]  [address]       Ph: [phone]       Fax: [phone]   RxID:   [id number]    PPD Application    Vaccine Type: PPD    Site: right forearm    Mfr: Sanofi Pasteur    Dose: 0.1 ml    Route: ID    Given by: Adella Hare LPN    Exp. Date: 01/21/2011    Lot #: [id number]

## 2010-05-10 NOTE — Letter (Signed)
Summary: History form  History form   Imported By: Jacklynn Ganong 08/09/2009 09:18:10  _____________________________________________________________________  External Attachment:    Type:   Image     Comment:   External Document

## 2010-05-12 NOTE — Progress Notes (Signed)
Summary: MEDICINE  Phone Note Other Incoming   Caller: DEVERN ROBERTSON  SISTER Summary of Call: DR SIMPSON CALLED AND TOLD HER THAT HIS RX WAS READY AND BY THE TIME HE GOT THERE THEY HAD DONE PUT IT BACK SHE DOES NOT KNOW WHAT IT WAS PLEASE SEND THE RX THAT DR HAD TOLD HER ABOUT AND CALL HER TOLET HER KNOW   [phone] SO SHE CAN PICK IT UP Initial call taken by: Lind Guest,  March 29, 2010 9:09 AM  Follow-up for Phone Call        pls refill insuklin , glipizide and lotensin x 3 Follow-up by: Syliva Overman MD,  March 29, 2010 5:01 PM  Additional Follow-up for Phone Call Additional follow up Details #1::        MED SENT, CALLED # AND WAS TOLD WRONG # Additional Follow-up by: Adella Hare LPN,  March 29, 2010 6:10 PM    Prescriptions: GLIPIZIDE 10 MG XR24H-TAB (GLIPIZIDE) Take 1 tablet by mouth two times a day  #60 x 2   Entered by:   Adella Hare LPN   Authorized by:   Syliva Overman MD   Signed by:   Adella Hare LPN on 09/81/1914   Method used:   Electronically to        [facility] (retail)       [address]       [place], [name]  [address]       Ph: [phone]       Fax: [phone]   RxID:   [id number] HUMULIN 70/30 70-30 % SUSP (INSULIN ISOPHANE & REGULAR) 20 units in the morning, and 20 units in the evening  #1200units x 2   Entered by:   Adella Hare LPN   Authorized by:   Syliva Overman MD   Signed by:   Adella Hare LPN on 40/34/7425   Method used:   Electronically to        [facility] (retail)       [address]       [place], [name]  [address]       Ph: [phone]       Fax: [phone]   RxID:   [id number] LOTENSIN 10 MG TABS (BENAZEPRIL HCL) Take 1 tablet by mouth once a day  #30 Each x 2   Entered by:   Adella Hare LPN   Authorized by:   Syliva Overman MD   Signed by:   Adella Hare LPN on 25/42/7062   Method used:   Electronically to        [facility] (retail)       [address]       [place], [name]  [address]       Ph: [phone]       Fax: [phone]   RxID:   [id number]

## 2010-08-15 ENCOUNTER — Encounter: Payer: Self-pay | Admitting: Family Medicine

## 2010-08-16 ENCOUNTER — Telehealth: Payer: Self-pay | Admitting: Family Medicine

## 2010-08-16 ENCOUNTER — Encounter: Payer: Self-pay | Admitting: Family Medicine

## 2010-08-16 ENCOUNTER — Ambulatory Visit (INDEPENDENT_AMBULATORY_CARE_PROVIDER_SITE_OTHER): Payer: MEDICARE | Admitting: Family Medicine

## 2010-08-16 DIAGNOSIS — R5383 Other fatigue: Secondary | ICD-10-CM

## 2010-08-16 DIAGNOSIS — Z111 Encounter for screening for respiratory tuberculosis: Secondary | ICD-10-CM

## 2010-08-16 DIAGNOSIS — Z125 Encounter for screening for malignant neoplasm of prostate: Secondary | ICD-10-CM

## 2010-08-16 DIAGNOSIS — I10 Essential (primary) hypertension: Secondary | ICD-10-CM

## 2010-08-16 DIAGNOSIS — E119 Type 2 diabetes mellitus without complications: Secondary | ICD-10-CM

## 2010-08-16 DIAGNOSIS — R5381 Other malaise: Secondary | ICD-10-CM

## 2010-08-16 MED ORDER — BENAZEPRIL HCL 10 MG PO TABS: 10.0000 mg | ORAL_TABLET | Freq: Every day | ORAL | Status: DC

## 2010-08-16 NOTE — Patient Instructions (Signed)
F/u in 3.5 months.  You need to return on Thursday morning for the TB test to be read.  Labs today , inc microalb

## 2010-08-17 ENCOUNTER — Encounter: Payer: Self-pay | Admitting: Family Medicine

## 2010-08-17 LAB — BASIC METABOLIC PANEL WITH GFR
BUN: 19 mg/dL (ref 6–23)
Creat: 1.43 mg/dL (ref 0.40–1.50)
GFR, Est African American: 59 mL/min — ABNORMAL LOW (ref >60)
GFR, Est Non African American: 49 mL/min — ABNORMAL LOW (ref >60)
Glucose, Bld: 304 mg/dL — ABNORMAL HIGH (ref 70–99)
Potassium: 4 mEq/L (ref 3.5–5.3)

## 2010-08-17 LAB — CBC WITH DIFFERENTIAL/PLATELET
Basophils Absolute: 0 10*3/uL (ref 0.0–0.1)
Eosinophils Absolute: 0 10*3/uL (ref 0.0–0.7)
Eosinophils Relative: 1 % (ref 0–5)
HCT: 37.4 % — ABNORMAL LOW (ref 39.0–52.0)
Lymphocytes Relative: 47 % — ABNORMAL HIGH (ref 12–46)
Lymphs Abs: 2 10*3/uL (ref 0.7–4.0)
MCH: 29.7 pg (ref 26.0–34.0)
MCV: 88.8 fL (ref 78.0–100.0)
Monocytes Absolute: 0.5 10*3/uL (ref 0.1–1.0)
RDW: 13.5 % (ref 11.5–15.5)
WBC: 4.2 10*3/uL (ref 4.0–10.5)

## 2010-08-17 NOTE — Assessment & Plan Note (Addendum)
Medication compliance addressed. Commitment to regular exercise and healthy  food choices, with portion control discussed. Low carb diet No Changes in medication made at this visit.Will wait on labs

## 2010-08-17 NOTE — Assessment & Plan Note (Signed)
Controlled, no change in medication  

## 2010-08-17 NOTE — Progress Notes (Signed)
  Subjective:    Patient ID: Jacob Barnes, male    DOB: [birth year], 70 y.o.   MRN: [id number]  HPI Pt reports he has been doing well. He has no specific concerns , and no new complaints. He still c/o right hand pain and swelling, did see a specialist about this but has no plans for intervention. He states his blood sugars fluctuate depending on his diet and fastings r reportedly from in the 70's to ovr 200. He has his meter butis unable to even turn it on. He does not take the insuiln as prescribed on a regular schedule but only as he feels the need to.Denies polyuria, polydipsia, blurred vision or hypoglycemic symptoms He is requesting PPD placement today also   Review of Systems Denies recent fever or chills. Denies sinus pressure, nasal congestion, ear pain or sore throat. Denies chest congestion, productive cough or wheezing. Denies chest pains, palpitations, paroxysmal nocturnal dyspnea, orthopnea and leg swelling Denies abdominal pain, nausea, vomiting,diarrhea or constipation.  Denies rectal bleeding or change in bowel movement. Denies dysuria, frequency, hesitancy or incontinence.  Denies headaches, seizure, numbness, or tingling. Denies depression, anxiety or insomnia. Denies skin break down or rash.        Objective:   Physical Exam Patient alert and oriented and in no Cardiopulmonary distress.  HEENT: No facial asymmetry, EOMI, no sinus tenderness, TM's clear, Oropharynx pink and moist.  Neck supple no adenopathy.  Chest: Clear to auscultation bilaterally.  CVS: S1, S2 no murmurs, no S3.  ABD: Soft non tender. Bowel sounds normal.  Ext: No edema  MS: Adequate ROM spine, shoulders, hips and knees.  Skin: Intact, no ulcerations or rash noted.  Psych: Good eye contact, normal affect. Memory intact not anxious or depressed appearing.  CNS: CN 2-12 intact, power, tone and sensation normal throughout.        Assessment & Plan:

## 2010-08-17 NOTE — Telephone Encounter (Signed)
Returned call, mailbox full

## 2010-08-18 ENCOUNTER — Telehealth: Payer: Self-pay | Admitting: Family Medicine

## 2010-08-18 LAB — MICROALBUMIN / CREATININE URINE RATIO
Creatinine, Urine: 144.8 mg/dL
Microalb Creat Ratio: 17.5 mg/g (ref 0.0–30.0)

## 2010-08-18 LAB — TB SKIN TEST

## 2010-08-18 NOTE — Telephone Encounter (Signed)
pls advise pt and enter 10 units twice daily of the insulin he is currently taking, pls explain to him his blood sugar is way out of control, he needs to be more diligent in testing and following the correct diet

## 2010-08-19 ENCOUNTER — Other Ambulatory Visit: Payer: Self-pay | Admitting: *Deleted

## 2010-08-19 DIAGNOSIS — E119 Type 2 diabetes mellitus without complications: Secondary | ICD-10-CM

## 2010-08-19 MED ORDER — INSULIN NPH ISOPHANE & REGULAR (70-30) 100 UNIT/ML ~~LOC~~ SUSP: 10.0000 [IU] | Freq: Two times a day (BID) | SUBCUTANEOUS | Status: DC

## 2010-12-19 ENCOUNTER — Encounter: Payer: Self-pay | Admitting: Family Medicine

## 2010-12-20 ENCOUNTER — Ambulatory Visit (INDEPENDENT_AMBULATORY_CARE_PROVIDER_SITE_OTHER): Payer: Self-pay | Admitting: Family Medicine

## 2010-12-20 ENCOUNTER — Encounter: Payer: Self-pay | Admitting: Family Medicine

## 2010-12-20 VITALS — BP 120/72 | HR 64 | Resp 16 | Ht 65.5 in | Wt 136.1 lb

## 2010-12-20 DIAGNOSIS — IMO0001 Reserved for inherently not codable concepts without codable children: Secondary | ICD-10-CM

## 2010-12-20 DIAGNOSIS — E119 Type 2 diabetes mellitus without complications: Secondary | ICD-10-CM

## 2010-12-20 DIAGNOSIS — Z13220 Encounter for screening for lipoid disorders: Secondary | ICD-10-CM

## 2010-12-20 DIAGNOSIS — I10 Essential (primary) hypertension: Secondary | ICD-10-CM

## 2010-12-20 LAB — HEMOGLOBIN A1C
Hgb A1c MFr Bld: 9.2 % — ABNORMAL HIGH (ref <5.7)
Mean Plasma Glucose: 217 mg/dL — ABNORMAL HIGH (ref <117)

## 2010-12-20 NOTE — Patient Instructions (Addendum)
F/u in 4.5 months.   You need the flu vaccine.  hBA1c today, if your sugar is still high you need to start a long acting insulin.   hba1C , cmp and egfr and fasting lipid in 4.5 months  LABWORK  NEEDS TO BE DONE BETWEEN 3 TO 7 DAYS BEFORE YOUR NEXT SCEDULED  VISIT.  THIS WILL IMPROVE THE QUALITY OF YOUR CARE.

## 2010-12-26 NOTE — Assessment & Plan Note (Signed)
Controlled, no change in medication  

## 2010-12-26 NOTE — Assessment & Plan Note (Signed)
Uncontrolled, pt non compliant with treatment plan, may need to refer to endocrine if he remains unwilling to follow the plan and blood sugars continue to deteriorate

## 2010-12-26 NOTE — Progress Notes (Signed)
  Subjective:    Patient ID: Jacob Barnes, male    DOB: June 15, 1940, 70 y.o.   MRN: [id number]  HPI The PT is here for follow up and re-evaluation of chronic medical conditions, medication management and review of any available recent lab and radiology data.  Preventive health is updated, specifically  Cancer screening and Immunization.   The PT denies any adverse reactions to current medications since the last visit. He continues to [place]  insulin on an "as needed basis " sttaes he tests his sugars daily and though they reportedly are seldom under 200, he often skips doses. I have tried repeatedly to get him on a fixed regime, but he describes lability in the numbers, with lows, and is just unwilling to do this There are no new concerns.  States he feels well      Review of Systems See HPI Denies recent fever or chills. Denies sinus pressure, nasal congestion, ear pain or sore throat. Denies chest congestion, productive cough or wheezing. Denies chest pains, palpitations and leg swelling Denies abdominal pain, nausea, vomiting,diarrhea or constipation.   Denies dysuria, frequency, hesitancy or incontinence. Denies joint pain, swelling and limitation in mobility. Denies headaches, seizures, numbness, or tingling. Denies depression, anxiety or insomnia. Denies skin break down or rash.        Objective:   Physical Exam Patient alert and oriented and in no cardiopulmonary distress.  HEENT: No facial asymmetry, EOMI, no sinus tenderness,  oropharynx pink and moist.  Neck supple no adenopathy.  Chest: Clear to auscultation bilaterally.  CVS: S1, S2 no murmurs, no S3.  ABD: Soft non tender. Bowel sounds normal.  Ext: No edema  MS: Adequate ROM spine, shoulders, hips and knees.  Skin: Intact, no ulcerations or rash noted.  Psych: Good eye contact, normal affect. Memory intact not anxious or depressed appearing.  CNS: CN 2-12 intact, power,  normal throughout.          Assessment & Plan:

## 2010-12-28 ENCOUNTER — Telehealth: Payer: Self-pay | Admitting: *Deleted

## 2010-12-28 MED ORDER — INSULIN DETEMIR 100 UNIT/ML ~~LOC~~ SOLN: 10.0000 [IU] | SUBCUTANEOUS | Status: DC

## 2010-12-28 NOTE — Telephone Encounter (Signed)
Message copied by Diamantina Monks on Wed Dec 28, 2010  9:41 AM ------      Message from: Syliva Overman MD E      Created: Sun Dec 25, 2010 11:28 PM       pls advise blood sugar is uncontrolled, needs to start once daily long acting insulin every day, not the one he has, pls spk with his sister devern about thisor his wife per his request, then erx levemir 10 units once every morning x 4 months

## 2010-12-28 NOTE — Progress Notes (Signed)
Addended by: Abner Greenspan on: 12/28/2010 01:29 PM   Modules accepted: Orders

## 2010-12-28 NOTE — Telephone Encounter (Signed)
Patient's sister aware.

## 2010-12-28 NOTE — Telephone Encounter (Signed)
Called patient, left message.

## 2011-03-15 ENCOUNTER — Other Ambulatory Visit: Payer: Self-pay | Admitting: Family Medicine

## 2011-03-22 DIAGNOSIS — IMO0002 Reserved for concepts with insufficient information to code with codable children: Secondary | ICD-10-CM | POA: Insufficient documentation

## 2011-05-21 LAB — COMPLETE METABOLIC PANEL WITH GFR
ALT: 13 U/L (ref 0–53)
AST: 33 U/L (ref 0–37)
Albumin: 4 g/dL (ref 3.5–5.2)
Calcium: 8.8 mg/dL (ref 8.4–10.5)
Chloride: 103 mEq/L (ref 96–112)
Potassium: 4.7 mEq/L (ref 3.5–5.3)
Sodium: 138 mEq/L (ref 135–145)
Total Protein: 7.6 g/dL (ref 6.0–8.3)

## 2011-05-21 LAB — LIPID PANEL
LDL Cholesterol: 74 mg/dL (ref 0–99)
VLDL: 7 mg/dL (ref 0–40)

## 2011-05-21 LAB — HEMOGLOBIN A1C
Hgb A1c MFr Bld: 8.8 % — ABNORMAL HIGH (ref <5.7)
Mean Plasma Glucose: 206 mg/dL — ABNORMAL HIGH (ref <117)

## 2011-05-25 ENCOUNTER — Ambulatory Visit (INDEPENDENT_AMBULATORY_CARE_PROVIDER_SITE_OTHER): Payer: MEDICARE | Admitting: Family Medicine

## 2011-05-25 ENCOUNTER — Encounter: Payer: Self-pay | Admitting: Family Medicine

## 2011-05-25 VITALS — BP 142/80 | HR 81 | Resp 16 | Ht 65.0 in | Wt 141.0 lb

## 2011-05-25 DIAGNOSIS — IMO0001 Reserved for inherently not codable concepts without codable children: Secondary | ICD-10-CM

## 2011-05-25 DIAGNOSIS — R5383 Other fatigue: Secondary | ICD-10-CM

## 2011-05-25 DIAGNOSIS — R5381 Other malaise: Secondary | ICD-10-CM

## 2011-05-25 DIAGNOSIS — Z1211 Encounter for screening for malignant neoplasm of colon: Secondary | ICD-10-CM

## 2011-05-25 DIAGNOSIS — Z125 Encounter for screening for malignant neoplasm of prostate: Secondary | ICD-10-CM

## 2011-05-25 DIAGNOSIS — I10 Essential (primary) hypertension: Secondary | ICD-10-CM

## 2011-05-25 DIAGNOSIS — IMO0002 Reserved for concepts with insufficient information to code with codable children: Secondary | ICD-10-CM

## 2011-05-25 DIAGNOSIS — E1065 Type 1 diabetes mellitus with hyperglycemia: Secondary | ICD-10-CM

## 2011-05-25 MED ORDER — INSULIN LISPRO PROT & LISPRO (75-25 MIX) 100 UNIT/ML ~~LOC~~ SUSP: 10.0000 [IU] | Freq: Two times a day (BID) | SUBCUTANEOUS | Status: DC

## 2011-05-25 MED ORDER — BENAZEPRIL HCL 10 MG PO TABS: ORAL_TABLET | ORAL | Status: DC

## 2011-05-25 NOTE — Assessment & Plan Note (Signed)
improved dietary modification, no med change, pt afraid and states he eats a lot of sweets

## 2011-05-25 NOTE — Assessment & Plan Note (Signed)
Elevated at this viist, will inc dose if remains so at next visit

## 2011-05-25 NOTE — Patient Instructions (Addendum)
F/u in 4 month  You are being referred for a screening colonoscopy in May, per your request  Your sugar has improved, congrats and keep getting better.  Remember to cut out the sweets, this will make a bIG difference  Labs are excellent.  I am concerned aboutyour blood pressure today, cut back on salt please, if still high at next visit, your med dose will be increased  It is important that you exercise regularly at least 30 minutes 5 times a week. If you develop chest pain, have severe difficulty breathing, or feel very tired, stop exercising immediately and seek medical attention

## 2011-05-29 ENCOUNTER — Encounter (INDEPENDENT_AMBULATORY_CARE_PROVIDER_SITE_OTHER): Payer: Self-pay | Admitting: *Deleted

## 2011-05-30 ENCOUNTER — Telehealth: Payer: Self-pay | Admitting: Family Medicine

## 2011-05-30 MED ORDER — GLIPIZIDE ER 10 MG PO TB24: 10.0000 mg | ORAL_TABLET | Freq: Two times a day (BID) | ORAL | Status: DC

## 2011-05-30 NOTE — Telephone Encounter (Signed)
Med sent in as requested  

## 2011-06-15 ENCOUNTER — Telehealth (INDEPENDENT_AMBULATORY_CARE_PROVIDER_SITE_OTHER): Payer: Self-pay | Admitting: *Deleted

## 2011-06-15 ENCOUNTER — Other Ambulatory Visit (INDEPENDENT_AMBULATORY_CARE_PROVIDER_SITE_OTHER): Payer: Self-pay | Admitting: *Deleted

## 2011-06-15 ENCOUNTER — Encounter (INDEPENDENT_AMBULATORY_CARE_PROVIDER_SITE_OTHER): Payer: Self-pay | Admitting: *Deleted

## 2011-06-15 DIAGNOSIS — Z1211 Encounter for screening for malignant neoplasm of colon: Secondary | ICD-10-CM

## 2011-06-15 NOTE — Telephone Encounter (Signed)
Patient needs movi prep 

## 2011-06-16 MED ORDER — PEG-KCL-NACL-NASULF-NA ASC-C 100 G PO SOLR: 1.0000 | Freq: Once | ORAL | Status: DC

## 2011-06-17 NOTE — Progress Notes (Signed)
  Subjective:    Patient ID: Jacob Barnes, male    DOB: 1940-07-13, 71 y.o.   MRN: [id number]  HPI The PT is here for follow up and re-evaluation of chronic medical conditions, medication management and review of any available recent lab and radiology data.  Preventive health is updated, specifically  Cancer screening and Immunization.   Questions or concerns regarding consultations or procedures which the PT has had in the interim are  addressed. The PT denies any adverse reactions to current medications since the last visit.  There are no new concerns.  There are no specific complaints  Reports fluctuation in blood sugars, fasting sugars seldom less than 140     Review of Systems See HPI Denies recent fever or chills. Denies sinus pressure, nasal congestion, ear pain or sore throat. Denies chest congestion, productive cough or wheezing. Denies chest pains, palpitations and leg swelling Denies abdominal pain, nausea, vomiting,diarrhea or constipation.   Denies dysuria, frequency, hesitancy or incontinence. Denies joint pain, swelling and limitation in mobility. Denies headaches, seizures, numbness, or tingling. Denies depression, anxiety or insomnia. Denies skin break down or rash.        Objective:   Physical Exam Patient alert and oriented and in no cardiopulmonary distress.  HEENT: No facial asymmetry, EOMI, no sinus tenderness,  oropharynx pink and moist.  Neck supple no adenopathy.  Chest: Clear to auscultation bilaterally.  CVS: S1, S2 no murmurs, no S3.  ABD: Soft non tender. Bowel sounds normal.  Ext: No edema  MS: Adequate ROM spine, shoulders, hips and knees.  Skin: Intact, no ulcerations or rash noted.  Psych: Good eye contact, normal affect. Memory intact not anxious or depressed appearing.  CNS: CN 2-12 intact, power, tone and sensation normal throughout. Diabetic Foot Check:  Appearance -   calluses Skin - no unusual pallor or redness. Tinea pedis  and onychomycosis Sensation - grossly intact to light touch Monofilament testing -  Right - Great toe, medial, central, lateral ball and posterior foot decreased Left - Great toe, medial, central, lateral ball and posterior foot decreaseed Pulses Left - Dorsalis Pedis and Posterior Tibia normal Right - Dorsalis Pedis and Posterior Tibia normal        Assessment & Plan:

## 2011-06-20 ENCOUNTER — Encounter (INDEPENDENT_AMBULATORY_CARE_PROVIDER_SITE_OTHER): Payer: Self-pay | Admitting: *Deleted

## 2011-07-17 ENCOUNTER — Encounter (INDEPENDENT_AMBULATORY_CARE_PROVIDER_SITE_OTHER): Payer: Self-pay | Admitting: *Deleted

## 2011-08-07 DIAGNOSIS — H04129 Dry eye syndrome of unspecified lacrimal gland: Secondary | ICD-10-CM | POA: Insufficient documentation

## 2011-08-09 ENCOUNTER — Telehealth (INDEPENDENT_AMBULATORY_CARE_PROVIDER_SITE_OTHER): Payer: Self-pay | Admitting: *Deleted

## 2011-08-09 NOTE — Telephone Encounter (Signed)
PCP/Requesting MD: simpson  Name & DOB: Jacob Barnes 07/25/2040     Procedure: tcs  Reason/Indication:  screening  Has patient had this procedure before?  no  If so, when, by whom and where?    Is there a family history of colon cancer?  no  Who?  What age when diagnosed?    Is patient diabetic?   yes      Does patient have prosthetic heart valve?  no  Do you have a pacemaker?  no  Has patient had joint replacement within last 12 months?  no  Is patient on Coumadin, Plavix and/or Aspirin? yes  Medications: see EPIC  Allergies: nkda  Medication Adjustment: asa 2 days, (per terri) 1/2 glipizide day ebfore, hold Detemir, continue Humulin  Procedure date & time: 09/06/11

## 2011-08-10 NOTE — Telephone Encounter (Signed)
agree

## 2011-08-21 ENCOUNTER — Telehealth: Payer: Self-pay | Admitting: Family Medicine

## 2011-08-22 NOTE — Telephone Encounter (Signed)
Noted. I do not have a diabetic supply form for him. Needs to have one faxed to office

## 2011-08-24 NOTE — Telephone Encounter (Signed)
I have filled out the form and faxed back

## 2011-09-01 ENCOUNTER — Encounter (HOSPITAL_COMMUNITY): Payer: Self-pay | Admitting: Pharmacy Technician

## 2011-09-05 MED ORDER — SODIUM CHLORIDE 0.45 % IV SOLN
Freq: Once | INTRAVENOUS | Status: AC
  Administered 2011-09-06: 1000 mL via INTRAVENOUS

## 2011-09-06 ENCOUNTER — Ambulatory Visit (HOSPITAL_COMMUNITY)
Admission: RE | Admit: 2011-09-06 | Discharge: 2011-09-06 | Disposition: A | Discharge status: Home / Self Care | Payer: PRIVATE HEALTH INSURANCE | Source: Ambulatory Visit | Attending: Internal Medicine | Admitting: Internal Medicine

## 2011-09-06 ENCOUNTER — Encounter (HOSPITAL_COMMUNITY): Payer: Self-pay | Admitting: *Deleted

## 2011-09-06 ENCOUNTER — Encounter (HOSPITAL_COMMUNITY)
Admission: RE | Disposition: A | Discharge status: Home / Self Care | Payer: Self-pay | Source: Ambulatory Visit | Attending: Internal Medicine

## 2011-09-06 DIAGNOSIS — Z01812 Encounter for preprocedural laboratory examination: Secondary | ICD-10-CM | POA: Insufficient documentation

## 2011-09-06 DIAGNOSIS — D126 Benign neoplasm of colon, unspecified: Secondary | ICD-10-CM

## 2011-09-06 DIAGNOSIS — Z7982 Long term (current) use of aspirin: Secondary | ICD-10-CM | POA: Insufficient documentation

## 2011-09-06 DIAGNOSIS — Z79899 Other long term (current) drug therapy: Secondary | ICD-10-CM | POA: Insufficient documentation

## 2011-09-06 DIAGNOSIS — Z1211 Encounter for screening for malignant neoplasm of colon: Secondary | ICD-10-CM

## 2011-09-06 DIAGNOSIS — Z794 Long term (current) use of insulin: Secondary | ICD-10-CM | POA: Insufficient documentation

## 2011-09-06 DIAGNOSIS — K6389 Other specified diseases of intestine: Secondary | ICD-10-CM

## 2011-09-06 DIAGNOSIS — I10 Essential (primary) hypertension: Secondary | ICD-10-CM | POA: Insufficient documentation

## 2011-09-06 DIAGNOSIS — E119 Type 2 diabetes mellitus without complications: Secondary | ICD-10-CM | POA: Insufficient documentation

## 2011-09-06 HISTORY — PX: COLONOSCOPY: SHX5424

## 2011-09-06 SURGERY — COLONOSCOPY
Anesthesia: Moderate Sedation

## 2011-09-06 MED ORDER — MIDAZOLAM HCL 5 MG/5ML IJ SOLN
INTRAMUSCULAR | Status: AC
  Filled 2011-09-06: qty 10

## 2011-09-06 MED ORDER — MEPERIDINE HCL 50 MG/ML IJ SOLN
INTRAMUSCULAR | Status: AC
  Filled 2011-09-06: qty 1

## 2011-09-06 MED ORDER — MIDAZOLAM HCL 5 MG/5ML IJ SOLN
INTRAMUSCULAR | Status: DC | PRN
  Administered 2011-09-06 (×2): 2 mg via INTRAVENOUS

## 2011-09-06 MED ORDER — STERILE WATER FOR IRRIGATION IR SOLN
Status: DC | PRN
  Administered 2011-09-06: 10:00:00

## 2011-09-06 MED ORDER — MEPERIDINE HCL 50 MG/ML IJ SOLN
INTRAMUSCULAR | Status: DC | PRN
  Administered 2011-09-06: 25 mg via INTRAVENOUS

## 2011-09-06 NOTE — Discharge Instructions (Signed)
Resume usual medications and diet. No driving for 24 hours. Physician will contact you with biopsy results.  Colonoscopy Care After Read the instructions outlined below and refer to this sheet in the next few weeks. These discharge instructions provide you with general information on caring for yourself after you leave the hospital. Your doctor may also give you specific instructions. While your treatment has been planned according to the most current medical practices available, unavoidable complications occasionally occur. If you have any problems or questions after discharge, [place] your doctor. HOME CARE INSTRUCTIONS ACTIVITY:  You may resume your regular activity, but move at a slower pace for the next 24 hours.   Take frequent rest periods for the next 24 hours.   Walking will help get rid of the air and reduce the bloated feeling in your belly (abdomen).   No driving for 24 hours (because of the medicine (anesthesia) used during the test).   You may shower.   Do not sign any important legal documents or operate any machinery for 24 hours (because of the anesthesia used during the test).  NUTRITION:  Drink plenty of fluids.   You may resume your normal diet as instructed by your doctor.   Begin with a light meal and progress to your normal diet. Heavy or fried foods are harder to digest and may make you feel sick to your stomach (nauseated).   Avoid alcoholic beverages for 24 hours or as instructed.  MEDICATIONS:  You may resume your normal medications unless your doctor tells you otherwise.  WHAT TO EXPECT TODAY:  Some feelings of bloating in the abdomen.   Passage of more gas than usual.   Spotting of blood in your stool or on the toilet paper.  IF YOU HAD POLYPS REMOVED DURING THE COLONOSCOPY:  No aspirin products for 7 days or as instructed.   No alcohol for 7 days or as instructed.   Eat a soft diet for the next 24 hours.  FINDING OUT THE RESULTS OF YOUR  TEST Not all test results are available during your visit. If your test results are not back during the visit, make an appointment with your caregiver to find out the results. Do not assume everything is normal if you have not heard from your caregiver or the medical facility. It is important for you to follow up on all of your test results.  SEEK IMMEDIATE MEDICAL CARE IF:  You have more than a spotting of blood in your stool.   Your belly is swollen (abdominal distention).   You are nauseated or vomiting.   You have a fever.   You have abdominal pain or discomfort that is severe or gets worse throughout the day.  Document Released: 11/09/2003 Document Revised: 03/16/2011 Document Reviewed: 11/07/2007 [facility] Patient Information 2012 [place].  Colon Polyps A polyp is extra tissue that grows inside your body. Colon polyps grow in the large intestine. The large intestine, also called the colon, is part of your digestive system. It is a long, hollow tube at the end of your digestive tract where your body makes and stores stool. Most polyps are not dangerous. They are benign. This means they are not cancerous. But over time, some types of polyps can turn into cancer. Polyps that are smaller than a pea are usually not harmful. But larger polyps could someday become or may already be cancerous. To be safe, doctors remove all polyps and test them.  WHO GETS POLYPS? Anyone can get polyps,  but certain people are more likely than others. You may have a greater chance of getting polyps if:  You are over 50.   You have had polyps before.   Someone in your family has had polyps.   Someone in your family has had cancer of the large intestine.   Find out if someone in your family has had polyps. You may also be more likely to get polyps if you:   Eat a lot of fatty foods.   Smoke.   Drink alcohol.   Do not exercise.   Eat too much.  SYMPTOMS  Most small polyps do not cause  symptoms. People often do not know they have one until their caregiver finds it during a regular checkup or while testing them for something else. Some people do have symptoms like these:  Bleeding from the anus. You might notice blood on your underwear or on toilet paper after you have had a bowel movement.   Constipation or diarrhea that lasts more than a week.   Blood in the stool. Blood can make stool look black or it can show up as red streaks in the stool.  If you have any of these symptoms, see your caregiver. HOW DOES THE DOCTOR TEST FOR POLYPS? The doctor can use four tests to check for polyps:  Digital rectal exam. The caregiver wears gloves and checks your rectum (the last part of the large intestine) to see if it feels normal. This test would find polyps only in the rectum. Your caregiver may need to do one of the other tests listed below to find polyps higher up in the intestine.   Barium enema. The caregiver puts a liquid called barium into your rectum before taking x-rays of your large intestine. Barium makes your intestine look white in the pictures. Polyps are dark, so they are easy to see.   Sigmoidoscopy. With this test, the caregiver can see inside your large intestine. A thin flexible tube is placed into your rectum. The device is called a sigmoidoscope, which has a light and a tiny video camera in it. The caregiver uses the sigmoidoscope to look at the last third of your large intestine.   Colonoscopy. This test is like sigmoidoscopy, but the caregiver looks at all of the large intestine. It usually requires sedation. This is the most common method for finding and removing polyps.  TREATMENT   The caregiver will remove the polyp during sigmoidoscopy or colonoscopy. The polyp is then tested for cancer.   If you have had polyps, your caregiver may want you to get tested regularly in the future.  PREVENTION  There is not one sure way to prevent polyps. You might be able to  lower your risk of getting them if you:  Eat more fruits and vegetables and less fatty food.   Do not smoke.   Avoid alcohol.   Exercise every day.   Lose weight if you are overweight.   Eating more calcium and folate can also lower your risk of getting polyps. Some foods that are rich in calcium are milk, cheese, and broccoli. Some foods that are rich in folate are chickpeas, kidney beans, and spinach.   Aspirin might help prevent polyps. Studies are under way.  Document Released: 12/22/2003 Document Revised: 03/16/2011 Document Reviewed: 05/29/2007 [facility] Patient Information 2012 [place], [place].

## 2011-09-06 NOTE — H&P (Signed)
Jacob Barnes is an 71 y.o. male.   Chief Complaint: Patient is here for colonoscopy. HPI: Patient is 72 year old male who is in for a screening colonoscopy. This is patient's first exam. He denies abdominal pain rectal bleeding or recent change in his bowel habits. Family history is negative for colorectal carcinoma.  Past Medical History  Diagnosis Date  . Hypertension   . Diabetes mellitus     Past Surgical History  Procedure Date  . Appendectomy   . Cystoscopy/retrograde/ureteroscopy/stone extraction with basket     Family History  Problem Relation Age of Onset  . Diabetes Mother   . Diabetes Father   . Heart disease Father   . Diabetes Brother   . Diabetes Brother   . Heart disease Brother    Social History:  reports that he has never smoked. He does not have any smokeless tobacco history on file. He reports that he does not drink alcohol or use illicit drugs.  Allergies: No Known Allergies  Medications Prior to Admission  Medication Sig Dispense Refill  . aspirin 81 MG EC tablet Take 81 mg by mouth daily.        . benazepril (LOTENSIN) 10 MG tablet Take 10 mg by mouth daily.      Marland Kitchen glipiZIDE (GLUCOTROL XL) 10 MG 24 hr tablet Take 1 tablet (10 mg total) by mouth 2 (two) times daily.  60 tablet  3  . insulin lispro protamine-insulin lispro (HUMALOG MIX 75/25) (75-25) 100 UNIT/ML SUSP Inject 10 Units into the skin 2 (two) times daily with a meal.  10 mL  3  . peg 3350 powder (MOVIPREP) 100 G SOLR Take 1 kit (100 g total) by mouth once.  1 kit  0    Results for orders placed during the hospital encounter of 09/06/11 (from the past 48 hour(s))  GLUCOSE, CAPILLARY     Status: Abnormal   Collection Time   09/06/11  8:37 AM      Component Value Range Comment   Glucose-Capillary 135 (*) 70 - 99 (mg/dL)    No results found.  ROS  Blood pressure 134/89, pulse 87, temperature 98 F (36.7 C), temperature source Oral, resp. rate 18, SpO2 99.00%. Physical Exam  Constitutional:  He appears well-developed and well-nourished.  HENT:  Mouth/Throat: Oropharynx is clear and moist.  Eyes: Conjunctivae are normal. No scleral icterus.  Neck: No thyromegaly present.  Cardiovascular: Normal rate, regular rhythm and normal heart sounds.   No murmur heard. Respiratory: Effort normal.  GI: Soft. He exhibits no distension and no mass. There is no tenderness.  Musculoskeletal: He exhibits no edema.  Lymphadenopathy:    He has no cervical adenopathy.  Neurological: He is alert.  Skin: Skin is warm.     Assessment/Plan Average risk screening colonoscopy.  Jacob Barnes U 09/06/2011, 9:41 AM

## 2011-09-06 NOTE — Op Note (Signed)
COLONOSCOPY PROCEDURE REPORT  PATIENT:  Jacob Barnes  MR#:  [id number] Birthdate:  September 18, 1940, 71 y.o., male Endoscopist:  Dr. Malissa Hippo, MD Referred By:  Dr. Syliva Overman, MD Procedure Date: 09/06/2011  Procedure:   Colonoscopy  Indications: Patient is 71 year old African male who is undergoing average risk screening colonoscopy. This is patient's first exam.  Informed Consent:  The procedure and risks were reviewed with the patient and informed consent was obtained.  Medications:  Demerol 25 mg IV Versed 4 mg IV  Description of procedure:  After a digital rectal exam was performed, that colonoscope was advanced from the anus through the rectum and colon to the area of the cecum, ileocecal valve and appendiceal orifice. The cecum was deeply intubated. These structures were well-seen and photographed for the record. From the level of the cecum and ileocecal valve, the scope was slowly and cautiously withdrawn. The mucosal surfaces were carefully surveyed utilizing scope tip to flexion to facilitate fold flattening as needed. The scope was pulled down into the rectum where a thorough exam including retroflexion was performed.  Findings:   Prep satisfactory. Two small polyps ablated via cold biopsy from from distal transverse colon and submitted together. Normal rectal mucosa. Tingle small anal papillae noted.  Therapeutic/Diagnostic Maneuvers Performed:  See above  Complications:  None  Cecal Withdrawal Time:  15 minutes.  Impression:  Examination performed to cecum. Two small polyps ablated via cold biopsy from transverse colon and submitted together. Small anal papilla  Recommendations:  Standard instructions given. I will contact patient with results of biopsy and further recommendations.  Sanika Brosious U  09/06/2011 10:18 AM  CC: Dr. Syliva Overman, MD, MD & Dr. Bonnetta Barry ref. provider found

## 2011-09-08 ENCOUNTER — Encounter (HOSPITAL_COMMUNITY): Payer: Self-pay | Admitting: Internal Medicine

## 2011-09-11 ENCOUNTER — Encounter (INDEPENDENT_AMBULATORY_CARE_PROVIDER_SITE_OTHER): Payer: Self-pay | Admitting: *Deleted

## 2011-09-26 ENCOUNTER — Encounter: Payer: Self-pay | Admitting: Family Medicine

## 2011-09-26 ENCOUNTER — Ambulatory Visit (INDEPENDENT_AMBULATORY_CARE_PROVIDER_SITE_OTHER): Payer: PRIVATE HEALTH INSURANCE | Admitting: Family Medicine

## 2011-09-26 VITALS — BP 114/74 | HR 73 | Resp 16 | Ht 65.0 in | Wt 135.8 lb

## 2011-09-26 DIAGNOSIS — I10 Essential (primary) hypertension: Secondary | ICD-10-CM

## 2011-09-26 DIAGNOSIS — R5383 Other fatigue: Secondary | ICD-10-CM

## 2011-09-26 DIAGNOSIS — A048 Other specified bacterial intestinal infections: Secondary | ICD-10-CM

## 2011-09-26 DIAGNOSIS — R5381 Other malaise: Secondary | ICD-10-CM

## 2011-09-26 DIAGNOSIS — R1013 Epigastric pain: Secondary | ICD-10-CM

## 2011-09-26 DIAGNOSIS — Z125 Encounter for screening for malignant neoplasm of prostate: Secondary | ICD-10-CM

## 2011-09-26 DIAGNOSIS — IMO0002 Reserved for concepts with insufficient information to code with codable children: Secondary | ICD-10-CM

## 2011-09-26 DIAGNOSIS — E1065 Type 1 diabetes mellitus with hyperglycemia: Secondary | ICD-10-CM

## 2011-09-26 DIAGNOSIS — K3189 Other diseases of stomach and duodenum: Secondary | ICD-10-CM

## 2011-09-26 DIAGNOSIS — IMO0001 Reserved for inherently not codable concepts without codable children: Secondary | ICD-10-CM

## 2011-09-26 LAB — TSH: TSH: 0.615 u[IU]/mL (ref 0.350–4.500)

## 2011-09-26 LAB — BASIC METABOLIC PANEL
CO2: 29 mEq/L (ref 19–32)
Glucose, Bld: 74 mg/dL (ref 70–99)
Potassium: 4.2 mEq/L (ref 3.5–5.3)
Sodium: 140 mEq/L (ref 135–145)

## 2011-09-26 LAB — HEMOGLOBIN A1C: Mean Plasma Glucose: 232 mg/dL — ABNORMAL HIGH (ref <117)

## 2011-09-26 MED ORDER — INSULIN GLARGINE 100 UNIT/ML ~~LOC~~ SOLN: 10.0000 [IU] | Freq: Every day | SUBCUTANEOUS | Status: DC

## 2011-09-26 NOTE — Patient Instructions (Addendum)
F/u in 6 weeks with meter and diabetic log  You are referred for indiivual counseling for diabetes education with the nutritionist   Start lantus 7 units every morning at breakfast, and glipizide one tablet every morning at breakfast  Labs today, and microalb.   Goal for fasting blood sugar ranges from 80 to 120 and 2 hours after any meal or at bedtime should be between 130 to 170.

## 2011-09-26 NOTE — Progress Notes (Signed)
  Subjective:    Patient ID: Jacob Barnes, male    DOB: May 29, 1940, 71 y.o.   MRN: [id number]  HPI The PT is here for follow up and re-evaluation of chronic medical conditions, medication management and review of any available recent lab and radiology data.  Preventive health is updated, specifically  Cancer screening and Immunization.   Questions or concerns regarding consultations or procedures which the PT has had in the interim are  addressed.The PT denies any adverse reactions to current medications since the last visit.  Still not testing blood sugars regularly, when he does hey are high. Pt also c/o severe heartburn causing him to avoid eating at times, worsening in the past 3 months      Review of Systems See HPI Denies recent fever or chills. Denies sinus pressure, nasal congestion, ear pain or sore throat. Denies chest congestion, productive cough or wheezing. Denies chest pains, palpitations and leg swelling .   Denies dysuria, frequency, hesitancy or incontinence. Denies joint pain, swelling and limitation in mobility. Denies headaches, seizures, numbness, or tingling. Denies depression, anxiety or insomnia. Denies skin break down or rash.        Objective:   Physical Exam Patient alert and oriented and in no cardiopulmonary distress.  HEENT: No facial asymmetry, EOMI, no sinus tenderness,  oropharynx pink and moist.  Neck supple no adenopathy.  Chest: Clear to auscultation bilaterally.  CVS: S1, S2 no murmurs, no S3.  ABD:. Bowel sounds normal.Mild epigastric tenderness, no masses or organomegaly  Ext: No edema  MS: Adequate ROM spine, shoulders, hips and knees.  Skin: Intact, no ulcerations or rash noted.  Psych: Good eye contact, normal affect. Memory intact not anxious or depressed appearing.  CNS: CN 2-12 intact, power, tone and sensation normal throughout.        Assessment & Plan:

## 2011-09-27 ENCOUNTER — Telehealth (HOSPITAL_COMMUNITY): Payer: Self-pay | Admitting: Dietician

## 2011-09-27 DIAGNOSIS — A048 Other specified bacterial intestinal infections: Secondary | ICD-10-CM | POA: Insufficient documentation

## 2011-09-27 MED ORDER — CLARITHROMYCIN 500 MG PO TABS: 500.0000 mg | ORAL_TABLET | Freq: Two times a day (BID) | ORAL | Status: AC

## 2011-09-27 MED ORDER — LANSOPRAZOLE 30 MG PO CPDR: 30.0000 mg | DELAYED_RELEASE_CAPSULE | Freq: Every day | ORAL | Status: DC

## 2011-09-27 MED ORDER — METRONIDAZOLE 500 MG PO TABS: 500.0000 mg | ORAL_TABLET | Freq: Two times a day (BID) | ORAL | Status: AC

## 2011-09-27 NOTE — Telephone Encounter (Signed)
Appointment scheduled for 10/06/11 at 10:00 AM.

## 2011-09-27 NOTE — Telephone Encounter (Signed)
Received referral via fax from Dr. Lodema Hong [facility]) for dx: HTN, diabetes.

## 2011-10-06 ENCOUNTER — Encounter (HOSPITAL_COMMUNITY): Payer: Self-pay | Admitting: Dietician

## 2011-10-06 NOTE — Progress Notes (Signed)
Outpatient Initial Nutrition Assessment  Date:10/06/2011   Time: 10:00 AM  Referring Physician: Dr. Lodema Hong Reason for Visit: uncontrolled diabetes  Nutrition Assessment:  Height: 5\' 5"  (165.1 cm)   Weight: 132 lb (59.875 kg)   IBW: 136# %IBW: 97% UBW: 135# %UBW: 97% Body mass index is 21.97 kg/(m^2).  Goal Weight: maintainence Weight hx: Pt reports his highest weight was 160# in the 1980's, due to the physical nature of his job. He has maintained a weight of 135# for quite some time per his report.   Estimated nutritional needs: 1347-1469 kcals daily, 48-60 grams protein daily, 1.3-1.5 L fluid daily  PMH:  Past Medical History  Diagnosis Date  . Hypertension   . Diabetes mellitus     Medications:  Current Outpatient Rx  Name Route Sig Dispense Refill  . ASPIRIN 81 MG PO TBEC Oral Take 81 mg by mouth daily.      Marland Kitchen BENAZEPRIL HCL 10 MG PO TABS Oral Take 10 mg by mouth daily.    Marland Kitchen CLARITHROMYCIN 500 MG PO TABS Oral Take 1 tablet (500 mg total) by mouth 2 (two) times daily. 28 tablet 0  . GLIPIZIDE ER 10 MG PO TB24 Oral Take 1 tablet (10 mg total) by mouth daily. 30 tablet 5    Dose reduction effective 09/26/2011  . INSULIN GLARGINE 100 UNIT/ML [place] SOLN Subcutaneous Inject 10 Units into the skin at bedtime. 10 mL 12  . LANSOPRAZOLE 30 MG PO CPDR Oral Take 1 capsule (30 mg total) by mouth daily. 30 capsule 1  . METRONIDAZOLE 500 MG PO TABS Oral Take 1 tablet (500 mg total) by mouth 2 (two) times daily. 28 tablet 0    Labs: CMP     Component Value Date/Time   NA 140 09/26/2011 1050   K 4.2 09/26/2011 1050   CL 104 09/26/2011 1050   CO2 29 09/26/2011 1050   GLUCOSE 74 09/26/2011 1050   BUN 25* 09/26/2011 1050   CREATININE 1.44* 09/26/2011 1050   CREATININE 1.37 01/21/2010 1750   CALCIUM 9.4 09/26/2011 1050   PROT 7.6 12/20/2010 1116   ALBUMIN 4.0 12/20/2010 1116   AST 33 12/20/2010 1116   ALT 13 12/20/2010 1116   ALKPHOS 71 12/20/2010 1116   BILITOT 0.4 12/20/2010 1116    Lipid Panel      Component Value Date/Time   CHOL 135 12/20/2010 1116   TRIG 36 12/20/2010 1116   HDL 54 12/20/2010 1116   CHOLHDL 2.5 12/20/2010 1116   VLDL 7 12/20/2010 1116   LDLCALC 74 12/20/2010 1116     Lab Results  Component Value Date   HGBA1C 9.7* 09/26/2011   HGBA1C 8.8* 12/20/2010   HGBA1C 9.2* 12/20/2010   Lab Results  Component Value Date   MICROALBUR 6.87* 09/26/2011   LDLCALC 74 12/20/2010   CREATININE 1.44* 09/26/2011     Lifestyle/ social habits: Mr. Yagi in a very pleasant gentleman who lives in [place], [place] with his wife. His sister and other family members live nearby. He has been retired for 6-7 years. Prior to his retirement, he worked in [employer].   Nutrition hx/habits: Mr. Treese and his sister, Devern, who is present today, reports that "he doesn't eat right". Mr. Patlan reports he eats 1-2 meals per day. Pt has hx of elevated Hgb A1c, ranging from 8.5-9.7 in the past year. He reports CBGs having improved since being started on insulin. He reports he checks his blood sugar twice a day, achieving fasting readings  in the 80's and postprandial readings in the 130's, since being started on insulin. He uses Splenda. He drinks mostly soda, but admits to sometimes drinking regular soda with water and ice. He goes out to eat daily. Since his wife works, he reports it is hard to prepare meals for himself due to carpal tunnel, so he usually eats fast food. He reports he has had diabetes since 1979 and the most difficult barrier to controlling his diabetes is cutting back on sweets, as that was a large part of his diet as a child. However, he does report to cutting back on sweets recently. His wife does the grocery shopping.  Diet recall: Breakfast (before 10 AM): cereal (honey nut cheerios or shredded wheat), whole milk OR boiled egg, steak biscuit, diet soda OR stewed apples, strak biscuit, regular soda; Dinner (7:30-9 PM): diet soda, whopper Montez Hageman; HS Snack: canned pears  Nutrition  Diagnosis: Inconsistent carbohydrate intake r/t disordered eating patter, nutrition-related knowledge deficit AEB skipping meals Hgb A1c: 9.7.   Nutrition Intervention: Nutrition rx: 1500 kcal NAS, diabetic diet; 3 meals per day; low calorie beverages only; limit sweets; limit 1 starch per meal  Education/Counseling Provided: Educated pt on diabetic diet. Emphasized importance of eating 3 meals per day, 4-5 hours apart. Emphasized plate method, sources of carbohydrates, and portion sizes. Discussed nutritional content of commonly eaten foods and offered healthier alternatives. Discussed limiting 1 starch per meal and drinking low calorie beverages to improve glycemic control.Discussed snack ideas. Provided plate method handout.   Understanding, Motivation, Ability to Follow Recommendations: Expect fair compliance.  Monitoring and Evaluation: Goals: 1) Weight maintenance; 2) 3 meals/day; 3) Hgb A1c < 7.0  Recommendations: 1) Limit 1 starch per meal; 2) Choose salad instead of fries at restaurant or remove bread off sandwich if starch side item is desired; 3) Continue to drink diet sodas and use artifical sweeteners in tea coffee; 4) Canned fruit in light syrup or juice; 5) Consume pre-portioned snack items (ex. Applesauce cup, fruit cup, yogurt) to ensure proper portion size  F/U: PRN. Provided RD contact information.   Orlene Plum, RD  10/06/2011  Time: 10:00 AM

## 2011-10-22 ENCOUNTER — Emergency Department (HOSPITAL_COMMUNITY)
Admission: EM | Admit: 2011-10-22 | Discharge: 2011-10-22 | Disposition: A | Discharge status: Home / Self Care | Payer: PRIVATE HEALTH INSURANCE | Attending: Emergency Medicine | Admitting: Emergency Medicine

## 2011-10-22 ENCOUNTER — Encounter (HOSPITAL_COMMUNITY): Payer: Self-pay | Admitting: Emergency Medicine

## 2011-10-22 DIAGNOSIS — M542 Cervicalgia: Secondary | ICD-10-CM | POA: Insufficient documentation

## 2011-10-22 DIAGNOSIS — E119 Type 2 diabetes mellitus without complications: Secondary | ICD-10-CM | POA: Insufficient documentation

## 2011-10-22 DIAGNOSIS — L0211 Cutaneous abscess of neck: Secondary | ICD-10-CM | POA: Insufficient documentation

## 2011-10-22 DIAGNOSIS — R220 Localized swelling, mass and lump, head: Secondary | ICD-10-CM | POA: Insufficient documentation

## 2011-10-22 DIAGNOSIS — L0390 Cellulitis, unspecified: Secondary | ICD-10-CM

## 2011-10-22 DIAGNOSIS — L03221 Cellulitis of neck: Secondary | ICD-10-CM | POA: Insufficient documentation

## 2011-10-22 DIAGNOSIS — Z79899 Other long term (current) drug therapy: Secondary | ICD-10-CM | POA: Insufficient documentation

## 2011-10-22 DIAGNOSIS — I10 Essential (primary) hypertension: Secondary | ICD-10-CM | POA: Insufficient documentation

## 2011-10-22 DIAGNOSIS — Z7982 Long term (current) use of aspirin: Secondary | ICD-10-CM | POA: Insufficient documentation

## 2011-10-22 DIAGNOSIS — R221 Localized swelling, mass and lump, neck: Secondary | ICD-10-CM | POA: Insufficient documentation

## 2011-10-22 LAB — COMPREHENSIVE METABOLIC PANEL
ALT: 14 U/L (ref 0–53)
AST: 24 U/L (ref 0–37)
Alkaline Phosphatase: 75 U/L (ref 39–117)
CO2: 30 mEq/L (ref 19–32)
Calcium: 9.9 mg/dL (ref 8.4–10.5)
Chloride: 95 mEq/L — ABNORMAL LOW (ref 96–112)
GFR calc Af Amer: 59 mL/min — ABNORMAL LOW (ref >90)
GFR calc non Af Amer: 51 mL/min — ABNORMAL LOW (ref >90)
Glucose, Bld: 289 mg/dL — ABNORMAL HIGH (ref 70–99)
Sodium: 130 mEq/L — ABNORMAL LOW (ref 135–145)
Total Bilirubin: 0.3 mg/dL (ref 0.3–1.2)

## 2011-10-22 LAB — CBC WITH DIFFERENTIAL/PLATELET
Basophils Absolute: 0 10*3/uL (ref 0.0–0.1)
Eosinophils Relative: 1 % (ref 0–5)
HCT: 39 % (ref 39.0–52.0)
Lymphocytes Relative: 44 % (ref 12–46)
Lymphs Abs: 2 10*3/uL (ref 0.7–4.0)
MCV: 91.5 fL (ref 78.0–100.0)
Monocytes Absolute: 0.6 10*3/uL (ref 0.1–1.0)
Neutro Abs: 1.9 10*3/uL (ref 1.7–7.7)
Platelets: 184 10*3/uL (ref 150–400)
RBC: 4.26 MIL/uL (ref 4.22–5.81)
RDW: 13.3 % (ref 11.5–15.5)
WBC: 4.6 10*3/uL (ref 4.0–10.5)

## 2011-10-22 MED ORDER — VANCOMYCIN HCL IN DEXTROSE 1-5 GM/200ML-% IV SOLN
1000.0000 mg | Freq: Once | INTRAVENOUS | Status: AC
  Administered 2011-10-22: 1000 mg via INTRAVENOUS
  Filled 2011-10-22: qty 200

## 2011-10-22 MED ORDER — SULFAMETHOXAZOLE-TRIMETHOPRIM 800-160 MG PO TABS: 1.0000 | ORAL_TABLET | Freq: Two times a day (BID) | ORAL | Status: AC

## 2011-10-22 NOTE — ED Notes (Signed)
Discharge instructions reviewed with pt, questions answered. Pt verbalized understanding.  

## 2011-10-22 NOTE — ED Notes (Signed)
Patient with c/o right ear pain. ? Insect bite with what appears to have abscessed.

## 2011-10-22 NOTE — ED Provider Notes (Cosign Needed)
History  This chart was scribed for Jacob Lennert, MD by Jacob Barnes. This patient was seen in room [facility] and the patient's care was started at 1313.  CSN: [id number]  Arrival date & time 10/22/11  1207   First MD Initiated Contact with Patient 10/22/11 1313      Chief Complaint  Patient presents with  . Abscess    Patient is a 71 y.o. male presenting with abscess. The history is provided by the patient. No language interpreter was used.  Abscess  This is a new problem. The current episode started less than one week ago. The onset was gradual. The problem occurs continuously. The problem has been gradually worsening. The abscess is present on the neck (Righ side of his neck under ear. ). The problem is moderate. The abscess is characterized by redness and swelling. Associated with: Tick bite 5 days ago when abscess started.  Pertinent negatives include no fever, no diarrhea, no vomiting, no congestion and no cough. There were no sick contacts. He has received no recent medical care.   Jacob Barnes is a 71 y.o. male who presents to the Emergency Department complaining of a gradually worsening constant lesion located below his ear on right side of his neck for 5 days after having a tick bite. He states pain and localized swelling to his right sided neck area as associated symptoms. He states tried using some antibiotic cream at home for the lesion but had no improvement in his symptoms. He denies any other injuries or illnesses at this time.  He is followed by his PCP Dr. Lodema Barnes.   Past Medical History  Diagnosis Date  . Hypertension   . Diabetes mellitus     Past Surgical History  Procedure Date  . Appendectomy   . Cystoscopy/retrograde/ureteroscopy/stone extraction with basket   . Colonoscopy 09/06/2011    Procedure: COLONOSCOPY;  Surgeon: Malissa Hippo, MD;  Location: [facility];  Service: Endoscopy;  Laterality: N/A;  830    Family History  Problem Relation Age of  Onset  . Diabetes Mother   . Diabetes Father   . Heart disease Father   . Diabetes Brother   . Diabetes Brother   . Heart disease Brother     History  Substance Use Topics  . Smoking status: Never Smoker   . Smokeless tobacco: Not on file  . Alcohol Use: No      Review of Systems  Constitutional: Negative for fever and fatigue.  HENT: Negative for congestion, sinus pressure and ear discharge.   Eyes: Negative for discharge.  Respiratory: Negative for cough.   Cardiovascular: Negative for chest pain.  Gastrointestinal: Negative for vomiting, abdominal pain and diarrhea.  Genitourinary: Negative for frequency and hematuria.  Musculoskeletal: Negative for back pain.  Skin: Positive for wound (He has an lesion on his right neck under his ear where he had a tick bite. ).  Neurological: Negative for seizures and headaches.  Hematological: Negative.   Psychiatric/Behavioral: Negative for hallucinations.  All other systems reviewed and are negative.    Allergies  Review of patient's allergies indicates no known allergies.  Home Medications   Current Outpatient Rx  Name Route Sig Dispense Refill  . ASPIRIN 81 MG PO TBEC Oral Take 81 mg by mouth daily.      Marland Kitchen BENAZEPRIL HCL 10 MG PO TABS Oral Take 10 mg by mouth daily.    Marland Kitchen GLIPIZIDE ER 10 MG PO TB24 Oral Take 1 tablet (10  mg total) by mouth daily. 30 tablet 5    Dose reduction effective 09/26/2011  . INSULIN GLARGINE 100 UNIT/ML [place] SOLN Subcutaneous Inject 10 Units into the skin at bedtime. 10 mL 12  . LANSOPRAZOLE 30 MG PO CPDR Oral Take 1 capsule (30 mg total) by mouth daily. 30 capsule 1    Triage Vitals: BP 118/88  Pulse 81  Temp 98.3 F (36.8 C) (Oral)  Resp 20  Ht 5\' 4"  (1.626 m)  Wt 136 lb (61.689 kg)  BMI 23.34 kg/m2  SpO2 98%  Physical Exam  Nursing note and vitals reviewed. Constitutional: He is oriented to person, place, and time. He appears well-developed.  HENT:  Head: Normocephalic.  Eyes:  Conjunctivae are normal.  Neck: No tracheal deviation present.  Cardiovascular:  No murmur heard. Musculoskeletal: Normal range of motion.  Neurological: He is alert and oriented to person, place, and time.  Skin: Skin is warm. There is erythema.       He has a 3 cm in diameter indurated, tender, raised area on his right neck just under his ear.    Psychiatric: He has a normal mood and affect.   There was no fluctuant area ED Course  Procedures (including critical care time) DIAGNOSTIC STUDIES: Oxygen Saturation is 98% on room air, normal by my interpretation.    COORDINATION OF CARE: At 118 PM Discussed treatment plan with patient which includes blood work and antibiotic medicine. Patient agrees.   Labs Reviewed - No data to display No results found.   No diagnosis found.    MDM   Pt has a small cellulitis.  He wants to try out patient therapy.  He will follow up with his md in the am for recheck       Jacob Lennert, MD 10/22/11 [phone]

## 2011-10-22 NOTE — ED Notes (Signed)
Vancomycin infusion complete, pt had no adverse reaction to medication.

## 2011-11-01 NOTE — Assessment & Plan Note (Signed)
Severe gERD , positive h pylori infection needs to be treated

## 2011-11-01 NOTE — Assessment & Plan Note (Signed)
Continued deterioration in blood sugar values, needs to take insulin, and also needs to attend diabetic class, this is discussed both with pt and his sister. Needs to take insulin

## 2011-11-01 NOTE — Assessment & Plan Note (Signed)
Controlled, no change in medication  

## 2011-11-07 ENCOUNTER — Ambulatory Visit: Payer: PRIVATE HEALTH INSURANCE | Admitting: Family Medicine

## 2011-11-15 ENCOUNTER — Encounter: Payer: Self-pay | Admitting: Family Medicine

## 2011-11-15 ENCOUNTER — Ambulatory Visit (INDEPENDENT_AMBULATORY_CARE_PROVIDER_SITE_OTHER): Payer: PRIVATE HEALTH INSURANCE | Admitting: Family Medicine

## 2011-11-15 VITALS — BP 124/60 | HR 74 | Resp 18 | Ht 65.0 in | Wt 133.0 lb

## 2011-11-15 DIAGNOSIS — IMO0001 Reserved for inherently not codable concepts without codable children: Secondary | ICD-10-CM

## 2011-11-15 DIAGNOSIS — IMO0002 Reserved for concepts with insufficient information to code with codable children: Secondary | ICD-10-CM

## 2011-11-15 DIAGNOSIS — I10 Essential (primary) hypertension: Secondary | ICD-10-CM

## 2011-11-15 DIAGNOSIS — E1065 Type 1 diabetes mellitus with hyperglycemia: Secondary | ICD-10-CM

## 2011-11-15 NOTE — Patient Instructions (Addendum)
F/u September 25 or after.   hBA1c and chem 7 non fasting September 20 or after  Take 8 units lantus every morning

## 2011-11-15 NOTE — Progress Notes (Signed)
  Subjective:    Patient ID: Jacob Barnes, male    DOB: [birth year], 71 y.o.   MRN: [id number]  HPI  Pt in stating he tests twice daily, morning 90 to 140, and pre dinner he is getting 130 to 190. He is actually taking the medication as prescribed, and has also attended a class. Denies polyuria, polydipsia, blurred vision or hypoglycemic episodes. He has his meter, which is accurate, he has been testing twice daily, adn meter readings are as he has  described  Review of Systems See HPI Denies recent fever or chills. Denies sinus pressure, nasal congestion, ear pain or sore throat. Denies chest congestion, productive cough or wheezing. Denies chest pains, palpitations and leg swelling Denies abdominal pain, nausea, vomiting,diarrhea or constipation.   Denies dysuria, frequency, hesitancy or incontinence. Denies joint pain, swelling and limitation in mobility. Denies headaches, seizures, numbness, or tingling. Denies depression, anxiety or insomnia. Denies skin break down or rash.       Objective:   Physical Exam Patient alert and oriented and in no cardiopulmonary distress.  HEENT: No facial asymmetry, EOMI, no sinus tenderness,  oropharynx pink and moist.  Neck supple no adenopathy.  Chest: Clear to auscultation bilaterally.  CVS: S1, S2 no murmurs, no S3.  ABD: Soft non tender. Bowel sounds normal.  Ext: No edema  MS: Adequate ROM spine, shoulders, hips and knees.  Skin: Intact, no ulcerations or rash noted.  Psych: Good eye contact, normal affect. Memory intact not anxious or depressed appearing.  CNS: CN 2-12 intact, power, tone and sensation normal throughout.        Assessment & Plan:

## 2011-11-19 NOTE — Assessment & Plan Note (Signed)
Appears to be better controlled, now that pt has become compliant, will f/u on HBA1C when due

## 2011-11-19 NOTE — Assessment & Plan Note (Signed)
Controlled, no change in medication  

## 2011-12-29 LAB — BASIC METABOLIC PANEL
BUN: 18 mg/dL (ref 6–23)
CO2: 24 mEq/L (ref 19–32)
Calcium: 9 mg/dL (ref 8.4–10.5)
Chloride: 106 mEq/L (ref 96–112)
Creat: 1.21 mg/dL (ref 0.50–1.35)
Glucose, Bld: 57 mg/dL — ABNORMAL LOW (ref 70–99)

## 2012-01-05 ENCOUNTER — Ambulatory Visit (INDEPENDENT_AMBULATORY_CARE_PROVIDER_SITE_OTHER): Payer: PRIVATE HEALTH INSURANCE | Admitting: Family Medicine

## 2012-01-05 ENCOUNTER — Encounter: Payer: Self-pay | Admitting: Family Medicine

## 2012-01-05 VITALS — BP 120/80 | HR 84 | Resp 16 | Ht 65.0 in | Wt 130.8 lb

## 2012-01-05 DIAGNOSIS — E1065 Type 1 diabetes mellitus with hyperglycemia: Secondary | ICD-10-CM

## 2012-01-05 DIAGNOSIS — IMO0002 Reserved for concepts with insufficient information to code with codable children: Secondary | ICD-10-CM

## 2012-01-05 DIAGNOSIS — L0291 Cutaneous abscess, unspecified: Secondary | ICD-10-CM

## 2012-01-05 DIAGNOSIS — L0390 Cellulitis, unspecified: Secondary | ICD-10-CM

## 2012-01-05 DIAGNOSIS — I10 Essential (primary) hypertension: Secondary | ICD-10-CM

## 2012-01-05 DIAGNOSIS — Z0000 Encounter for general adult medical examination without abnormal findings: Secondary | ICD-10-CM

## 2012-01-05 DIAGNOSIS — IMO0001 Reserved for inherently not codable concepts without codable children: Secondary | ICD-10-CM

## 2012-01-05 MED ORDER — SULFAMETHOXAZOLE-TRIMETHOPRIM 800-160 MG PO TABS: 1.0000 | ORAL_TABLET | Freq: Two times a day (BID) | ORAL | Status: AC

## 2012-01-05 MED ORDER — BENAZEPRIL HCL 10 MG PO TABS: 10.0000 mg | ORAL_TABLET | Freq: Every day | ORAL | Status: DC

## 2012-01-05 MED ORDER — GLIPIZIDE 10 MG PO TABS: 10.0000 mg | ORAL_TABLET | Freq: Two times a day (BID) | ORAL | Status: DC

## 2012-01-05 NOTE — Progress Notes (Signed)
Subjective:    Patient ID: Jacob Barnes, male    DOB: 12/24/1940, 71 y.o.   MRN: [id number]  HPI  Preventive Screening-Counseling & Management   Patient present here today for a Medicare annual wellness visit. Has concerns about painful red area on his back where he was scratching and he feels is infected. Denies purulent drainage or abces. Here al;so to review IDDM status , which has markedly improved since last check. He is being gently encouraged and advised to further increase med doses, and continue diligence with food choice so this can further improve   Current Problems (verified)   Medications Prior to Visit Allergies (verified)   PAST HISTORY  Family History: significant for diabetes and heart disease  Social History Lives with his wife, and daughter.Never smoker, alcohol or street drugs. 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 No  Over the past two weeks, have you felt little interest or pleasure in doing things? No  Have you lost interest or pleasure in daily life? No  Do you often feel hopeless? No  Do you cry easily over simple problems? No   Activities of Daily Living  In your present state of health, do you have any difficulty performing the following activities?  Driving?: No Managing money?: No Feeding yourself?:No Getting from bed to chair?:No Climbing a flight of stairs?:No Preparing food and eating?:No Bathing or showering?:No Getting dressed?:No Getting to the toilet?:No Using the toilet?:No Moving around from place to place?: No  Fall Risk Assessment In the past year have you fallen or had a near  fall?:No Are you currently taking any medications that make you dizziness?:No   Hearing Difficulties: No Do you often ask people to speak up or repeat themselves?:No Do you experience ringing or noises in your ears?:No Do you have difficulty understanding soft or whispered voices?:No  Cognitive Testing  Alert? Yes Normal Appearance?Yes  Oriented to person? Yes Place? Yes  Time? Yes  Displays appropriate judgment?Yes  Can read the correct time from a watch face? yes Are you having problems remembering things?No  Advanced Directives have been discussed with the patient?Yes    List the Names of Other Physician/Practitioners you currently use: None   Indicate any recent Medical Services you may have received from other than [facility] providers in the past year (date may be approximate).   Assessment:    Annual Wellness Exam   Plan:    During the course of the visit the patient was educated and counseled about appropriate screening and preventive services including:  A healthy diet is rich in fruit, vegetables and whole grains. Poultry fish, nuts and beans are a healthy choice for protein rather then red meat. A low sodium diet and drinking 64 ounces of water daily is generally recommended. Oils and sweet should be limited. Carbohydrates especially for those who are diabetic or overweight, should be limited to 30-45 gram per meal. It is important to eat on a regular schedule, at least 3 times daily. Snacks should be primarily fruits, vegetables or nuts. It is important that you exercise regularly at least 30 minutes 5 times a  week. If you develop chest pain, have severe difficulty breathing, or feel very tired, stop exercising immediately and seek medical attention  Immunization reviewed and updated. Cancer screening reviewed and updated    Patient Instructions (the written plan) was given to the patient.  Medicare Attestation  I have personally reviewed:  The patient's medical and  social history  Their use of alcohol, tobacco or illicit drugs  Their current medications and supplements  The patient's functional ability including ADLs,fall risks, home safety risks, cognitive, and hearing and visual impairment  Diet and physical activities  Evidence for depression or mood disorders  The patient's weight, height, BMI, and visual acuity have been recorded in the chart. I have made referrals, counseling, and provided education to the patient based on review of the above and I have provided the patient with a written personalized care plan for preventive services.     Review of Systems     Objective:   Physical Exam        Assessment & Plan:

## 2012-01-05 NOTE — Patient Instructions (Addendum)
F/u in  [facility]  January.Call if you need me before  Increase the lantus to 12 units every morning, continue other medication as before.  You have improved A LOT, congrats, keep it up!  Check your pharmacy re shingles vaccine.  Please re consider the flu vaccine , you need it  HBA1C and chem 7 in mid January before visit

## 2012-01-07 ENCOUNTER — Encounter: Payer: Self-pay | Admitting: Family Medicine

## 2012-01-07 DIAGNOSIS — Z0000 Encounter for general adult medical examination without abnormal findings: Secondary | ICD-10-CM | POA: Insufficient documentation

## 2012-01-07 NOTE — Assessment & Plan Note (Signed)
Irritated lesion on mid back where pt has been scratching himself, with erythema fo the skin.  Advised against scratching the skin, and antibiotic prescribed

## 2012-01-07 NOTE — Assessment & Plan Note (Signed)
Marked improvement, pt applauded on this. Dose increase in medication discussed, and to be implemented

## 2012-01-07 NOTE — Assessment & Plan Note (Signed)
Controlled, no change in medication  

## 2012-01-07 NOTE — Assessment & Plan Note (Addendum)
Annual wellness performed and documented. Pt has good insight into end of life issues, this just needs formalization in writing, I also encouraged him to discuss with his relatives. He is fully functional, low fall risk, not depressed, able to care for himself and his finances currently He continues to refuse the flu vaccine, and will check on coverage for zostavax

## 2012-03-28 DIAGNOSIS — H3581 Retinal edema: Secondary | ICD-10-CM | POA: Insufficient documentation

## 2012-03-28 DIAGNOSIS — E113299 Type 2 diabetes mellitus with mild nonproliferative diabetic retinopathy without macular edema, unspecified eye: Secondary | ICD-10-CM | POA: Insufficient documentation

## 2012-04-18 ENCOUNTER — Other Ambulatory Visit: Payer: Self-pay | Admitting: Family Medicine

## 2012-04-18 LAB — HEMOGLOBIN A1C: Hgb A1c MFr Bld: 9.5 % — ABNORMAL HIGH (ref <5.7)

## 2012-04-18 LAB — BASIC METABOLIC PANEL
BUN: 20 mg/dL (ref 6–23)
Creat: 1.3 mg/dL (ref 0.50–1.35)
Glucose, Bld: 87 mg/dL (ref 70–99)

## 2012-04-19 LAB — CREATININE WITH EST GFR
Creat: 1.38 mg/dL — ABNORMAL HIGH (ref 0.50–1.35)
GFR, Est African American: 59 mL/min — ABNORMAL LOW

## 2012-05-01 ENCOUNTER — Ambulatory Visit (INDEPENDENT_AMBULATORY_CARE_PROVIDER_SITE_OTHER): Payer: PRIVATE HEALTH INSURANCE | Admitting: Family Medicine

## 2012-05-01 ENCOUNTER — Encounter: Payer: Self-pay | Admitting: Family Medicine

## 2012-05-01 VITALS — BP 126/80 | HR 90 | Resp 18 | Ht 65.0 in | Wt 136.0 lb

## 2012-05-01 DIAGNOSIS — IMO0002 Reserved for concepts with insufficient information to code with codable children: Secondary | ICD-10-CM

## 2012-05-01 DIAGNOSIS — I10 Essential (primary) hypertension: Secondary | ICD-10-CM

## 2012-05-01 DIAGNOSIS — IMO0001 Reserved for inherently not codable concepts without codable children: Secondary | ICD-10-CM

## 2012-05-01 DIAGNOSIS — L0390 Cellulitis, unspecified: Secondary | ICD-10-CM

## 2012-05-01 DIAGNOSIS — E1065 Type 1 diabetes mellitus with hyperglycemia: Secondary | ICD-10-CM

## 2012-05-01 DIAGNOSIS — L0291 Cutaneous abscess, unspecified: Secondary | ICD-10-CM

## 2012-05-01 MED ORDER — DOXYCYCLINE HYCLATE 100 MG PO TABS: 100.0000 mg | ORAL_TABLET | Freq: Two times a day (BID) | ORAL | Status: AC

## 2012-05-01 NOTE — Patient Instructions (Addendum)
F/u mid March , foot exam at that visit  Please test at least twice daily, OK to test up to 3 times daily, write down , and bring your meter and the log book to the next visit  Fasting blood sugar 80 to 120  2 hours after lunch  130 to 170  Bedtime, last thing at night , after you have STOPPED eating, 130 to 170  Please stop eating a whole cup of ice cream at night. If you want to eat ice cream , no more than half cup, and eat at lunchtime.  You need to go back to the diabetic class at the hospital to eat correctly, this is IMPORTANT  Your blood sugars are out of control, they are too high, this will damage your kidneys  Medication is sent in for the skin infection  Please check your pharmacy for the shingles vaccine , you need this

## 2012-05-19 NOTE — Assessment & Plan Note (Signed)
Antibiotic course prescribed for rash, if does not resolve, needs eval by dermatology

## 2012-05-19 NOTE — Assessment & Plan Note (Signed)
Controlled, no change in medication DASH diet and commitment to daily physical activity for a minimum of 30 minutes discussed and encouraged, as a part of hypertension management. The importance of attaining a healthy weight is also discussed.  

## 2012-05-19 NOTE — Assessment & Plan Note (Signed)
Non co55mpliant with diet as well as medication. Counseled again for over 5 minutes, pt states he will improve

## 2012-05-19 NOTE — Progress Notes (Signed)
  Subjective:    Patient ID: Jacob Barnes, male    DOB: [birth year], 72 y.o.   MRN: [id number]  HPI The PT is here for follow up and re-evaluation of chronic medical conditions, medication management and review of any available recent lab and radiology data.  Preventive health is updated, specifically  Cancer screening and Immunization.    The PT denies any adverse reactions to current medications since the last visit.  C/o rash on abdomen , upper and lower extremities x 2 weks, pruritic, has drainage from some of the lesions, denies fever or chills Blood sugars are re[ported as being good when checked in the mornings, however further questioning , pt is eating a lot of ice cream both at lunchtime and at supper    Review of Systems    See HPI Denies recent fever or chills. Denies sinus pressure, nasal congestion, ear pain or sore throat. Denies chest congestion, productive cough or wheezing. Denies chest pains, palpitations and leg swelling Denies abdominal pain, nausea, vomiting,diarrhea or constipation.   Denies dysuria, frequency, hesitancy or incontinence. Denies joint pain, swelling and limitation in mobility. Denies headaches, seizures, numbness, or tingling. Denies depression, anxiety or insomnia.    Objective:   Physical Exam  Patient alert and oriented and in no cardiopulmonary distress.  HEENT: No facial asymmetry, EOMI, no sinus tenderness,  oropharynx pink and moist.  Neck supple no adenopathy.  Chest: Clear to auscultation bilaterally.  CVS: S1, S2 no murmurs, no S3.  ABD: Soft non tender. Bowel sounds normal.  Ext: No edema  MS: Adequate ROM spine, shoulders, hips and knees.  Skin: hyperpigmented macular annular lesions on trunk and extremities Psych: Good eye contact, normal affect. Memory intact not anxious or depressed appearing.  CNS: CN 2-12 intact, power,  normal throughout.       Assessment & Plan:

## 2012-06-27 ENCOUNTER — Encounter: Payer: Self-pay | Admitting: Family Medicine

## 2012-06-27 ENCOUNTER — Ambulatory Visit (INDEPENDENT_AMBULATORY_CARE_PROVIDER_SITE_OTHER): Payer: PRIVATE HEALTH INSURANCE | Admitting: Family Medicine

## 2012-06-27 VITALS — BP 130/80 | HR 73 | Resp 16 | Ht 65.0 in | Wt 138.0 lb

## 2012-06-27 DIAGNOSIS — IMO0002 Reserved for concepts with insufficient information to code with codable children: Secondary | ICD-10-CM

## 2012-06-27 DIAGNOSIS — IMO0001 Reserved for inherently not codable concepts without codable children: Secondary | ICD-10-CM

## 2012-06-27 DIAGNOSIS — I10 Essential (primary) hypertension: Secondary | ICD-10-CM

## 2012-06-27 DIAGNOSIS — E1065 Type 1 diabetes mellitus with hyperglycemia: Secondary | ICD-10-CM

## 2012-06-27 DIAGNOSIS — K219 Gastro-esophageal reflux disease without esophagitis: Secondary | ICD-10-CM | POA: Insufficient documentation

## 2012-06-27 LAB — GLUCOSE, POCT (MANUAL RESULT ENTRY): POC Glucose: 78 mg/dl (ref 70–99)

## 2012-06-27 MED ORDER — PANTOPRAZOLE SODIUM 20 MG PO TBEC: 20.0000 mg | DELAYED_RELEASE_TABLET | Freq: Every day | ORAL | Status: DC

## 2012-06-27 NOTE — Assessment & Plan Note (Signed)
Uncontrolled by history, pt reports marked fluctuations in blood sugars, and states he "lives in 2 different places"Again tried to arrange for nurse to visit to teach use of insulin and appropriate diet, this is resisted Will check HBa1c today

## 2012-06-27 NOTE — Assessment & Plan Note (Signed)
Controlled, no change in medication  

## 2012-06-27 NOTE — Progress Notes (Signed)
  Subjective:    Patient ID: Jacob Barnes, male    DOB: [birth year], 72 y.o.   MRN: [id number]  HPI The PT is here for follow up and re-evaluation of chronic medical conditions, medication management and review of any available recent lab and radiology data.  Preventive health is updated, specifically  Cancer screening and Immunization.   Skin rash has cleared, blood sugars continue to be uncontrolled and to fluctuate a lot. Refusing help at home with blood sugar management still The PT denies any adverse reactions to current medications since the last visit.        Review of Systems See HPI Denies recent fever or chills. Denies sinus pressure, nasal congestion, ear pain or sore throat. Denies chest congestion, productive cough or wheezing. Denies chest pains, palpitations and leg swelling Denies abdominal pain, nausea, vomiting,diarrhea or constipation.   Denies dysuria, frequency, hesitancy or incontinence. Denies joint pain, swelling and limitation in mobility. Denies headaches, seizures, numbness, or tingling. Denies depression, anxiety or insomnia. Denies skin break down or rash.         Objective:   Physical Exam  Patient alert and oriented and in no cardiopulmonary distress.  HEENT: No facial asymmetry, EOMI, no sinus tenderness,  oropharynx pink and moist.  Neck supple no adenopathy.  Chest: Clear to auscultation bilaterally.  CVS: S1, S2 no murmurs, no S3.  ABD: Soft non tender. Bowel sounds normal.  Ext: No edema  MS: Adequate ROM spine, shoulders, hips and knees.  Skin: Intact, no ulcerations or rash noted.  Psych: Good eye contact, normal affect. Memory intact not anxious or depressed appearing.  CNS: CN 2-12 intact, power, tone and sensation normal throughout.       Assessment & Plan:

## 2012-06-27 NOTE — Patient Instructions (Addendum)
Annual wellness  in 3.5 month  Labs today Hba1C and chem 7 and EGFR and Hpylori   New medication for heartburn

## 2012-06-28 LAB — COMPLETE METABOLIC PANEL WITH GFR
Alkaline Phosphatase: 77 U/L (ref 39–117)
BUN: 17 mg/dL (ref 6–23)
CO2: 30 mEq/L (ref 19–32)
Creat: 1.26 mg/dL (ref 0.50–1.35)
GFR, Est African American: 65 mL/min
GFR, Est Non African American: 57 mL/min — ABNORMAL LOW
Glucose, Bld: 226 mg/dL — ABNORMAL HIGH (ref 70–99)
Total Bilirubin: 0.4 mg/dL (ref 0.3–1.2)

## 2012-06-28 LAB — H. PYLORI ANTIBODY, IGG: H Pylori IgG: 1.9 {ISR} — ABNORMAL HIGH

## 2012-07-25 ENCOUNTER — Other Ambulatory Visit: Payer: Self-pay | Admitting: Family Medicine

## 2012-08-16 ENCOUNTER — Other Ambulatory Visit: Payer: Self-pay | Admitting: Family Medicine

## 2012-08-31 ENCOUNTER — Emergency Department (HOSPITAL_COMMUNITY)
Admission: EM | Admit: 2012-08-31 | Discharge: 2012-09-01 | Disposition: A | Discharge status: Home / Self Care | Payer: PRIVATE HEALTH INSURANCE | Attending: Emergency Medicine | Admitting: Emergency Medicine

## 2012-08-31 ENCOUNTER — Encounter (HOSPITAL_COMMUNITY): Payer: Self-pay | Admitting: *Deleted

## 2012-08-31 DIAGNOSIS — R5381 Other malaise: Secondary | ICD-10-CM | POA: Insufficient documentation

## 2012-08-31 DIAGNOSIS — Z79899 Other long term (current) drug therapy: Secondary | ICD-10-CM | POA: Insufficient documentation

## 2012-08-31 DIAGNOSIS — E1169 Type 2 diabetes mellitus with other specified complication: Secondary | ICD-10-CM | POA: Insufficient documentation

## 2012-08-31 DIAGNOSIS — R61 Generalized hyperhidrosis: Secondary | ICD-10-CM | POA: Insufficient documentation

## 2012-08-31 DIAGNOSIS — R109 Unspecified abdominal pain: Secondary | ICD-10-CM | POA: Insufficient documentation

## 2012-08-31 DIAGNOSIS — R252 Cramp and spasm: Secondary | ICD-10-CM

## 2012-08-31 DIAGNOSIS — M79609 Pain in unspecified limb: Secondary | ICD-10-CM | POA: Insufficient documentation

## 2012-08-31 DIAGNOSIS — Z794 Long term (current) use of insulin: Secondary | ICD-10-CM | POA: Insufficient documentation

## 2012-08-31 DIAGNOSIS — Z7982 Long term (current) use of aspirin: Secondary | ICD-10-CM | POA: Insufficient documentation

## 2012-08-31 DIAGNOSIS — I10 Essential (primary) hypertension: Secondary | ICD-10-CM | POA: Insufficient documentation

## 2012-08-31 DIAGNOSIS — E162 Hypoglycemia, unspecified: Secondary | ICD-10-CM

## 2012-08-31 LAB — TROPONIN I: Troponin I: <0.3 ng/mL (ref <0.30)

## 2012-08-31 LAB — CBC WITH DIFFERENTIAL/PLATELET
Eosinophils Relative: 0 % (ref 0–5)
HCT: 32.7 % — ABNORMAL LOW (ref 39.0–52.0)
Lymphocytes Relative: 23 % (ref 12–46)
Lymphs Abs: 1.5 10*3/uL (ref 0.7–4.0)
MCV: 89.8 fL (ref 78.0–100.0)
Neutro Abs: 4.5 10*3/uL (ref 1.7–7.7)
Platelets: 117 10*3/uL — ABNORMAL LOW (ref 150–400)
RBC: 3.64 MIL/uL — ABNORMAL LOW (ref 4.22–5.81)
WBC: 6.4 10*3/uL (ref 4.0–10.5)

## 2012-08-31 LAB — COMPREHENSIVE METABOLIC PANEL
ALT: 15 U/L (ref 0–53)
AST: 25 U/L (ref 0–37)
Alkaline Phosphatase: 104 U/L (ref 39–117)
CO2: 26 mEq/L (ref 19–32)
Chloride: 98 mEq/L (ref 96–112)
Creatinine, Ser: 1.39 mg/dL — ABNORMAL HIGH (ref 0.50–1.35)
GFR calc non Af Amer: 49 mL/min — ABNORMAL LOW (ref >90)
Potassium: 4.4 mEq/L (ref 3.5–5.1)
Total Bilirubin: 0.2 mg/dL — ABNORMAL LOW (ref 0.3–1.2)

## 2012-08-31 LAB — GLUCOSE, CAPILLARY: Glucose-Capillary: 378 mg/dL — ABNORMAL HIGH (ref 70–99)

## 2012-08-31 MED ORDER — GI COCKTAIL ~~LOC~~
30.0000 mL | Freq: Once | ORAL | Status: AC
  Administered 2012-08-31: 30 mL via ORAL
  Filled 2012-08-31: qty 30

## 2012-08-31 MED ORDER — FAMOTIDINE 20 MG PO TABS
20.0000 mg | ORAL_TABLET | Freq: Once | ORAL | Status: AC
  Administered 2012-08-31: 20 mg via ORAL
  Filled 2012-08-31: qty 1

## 2012-08-31 NOTE — ED Notes (Signed)
Pt with leg cramps since hypoglycemic episode, pt not eating properly with amount of insulin, denies any cramps at this time with legs stretched out

## 2012-08-31 NOTE — ED Notes (Signed)
Pt reports having an episode of hypoglycemia about 3 this afternoon.  States that BS dropped to about 53.  EMS came to house and glucose was given.  Pt now c/o cramping and weakness in legs.  Blood sugar 378 in triage.

## 2012-08-31 NOTE — ED Provider Notes (Signed)
History  This chart was scribed for Ward Givens, MD by Bennett Scrape, ED Scribe. This patient was seen in room [facility]09/[facility]09 and the patient's care was started at 8:16 PM.  CSN: [id number]  Arrival date & time 08/31/12  1823   First MD Initiated Contact with Patient 08/31/12 2016      Chief Complaint  Patient presents with  . Hypoglycemia    The history is provided by the patient. No language interpreter was used.    HPI Comments: Jacob Barnes is a 72 y.o. male with a h/o DM who presents to the Emergency Department complaining of one episode of hypoglycemia with associated diaphoresis and lethargy that occurred about 5 hours ago. He was found by his grandson passed out on the floor and diaphoretic. EMS was called and administered glucose. He states that he felt improved after that and went to his sister's house. Sister states that she brought the pt in because he started c/o bilateral leg cramps. Pt denies having cramps currently and denies that this is a frequent occurrence. He admits that he ate less than normal for breakfast today and denies that he ate lunch today.  He states that he just wasn't hungry. He reports that he checked his CBG at 200 this morning and took his regular DM medications today. Sister reports that when his CBG is high the pt won't eat for fear of making it higher. Pt admits that he has skipped meals in the past due to this fear. He denies having any symptoms upon waking today.  He states that he has epigastric burning currently which he has protonix pills for but has not taken them. He reports that he currently feels lethargic but denies SOB, CP, HA and nausea. Pt denies smoking and alcohol use.  PCP is Dr. Lodema Hong.  Lives with Jacob Barnes and Jacob Barnes  Past Medical History  Diagnosis Date  . Hypertension   . Diabetes mellitus     Past Surgical History  Procedure Laterality Date  . Appendectomy    . Cystoscopy/retrograde/ureteroscopy/stone extraction with basket     . Colonoscopy  09/06/2011    Procedure: COLONOSCOPY;  Surgeon: Malissa Hippo, MD;  Location: [facility];  Service: Endoscopy;  Laterality: N/A;  830    Family History  Problem Relation Age of Onset  . Diabetes Mother   . Diabetes Father   . Heart disease Father   . Diabetes Brother   . Diabetes Brother   . Heart disease Brother     History  Substance Use Topics  . Smoking status: Never Smoker   . Smokeless tobacco: Not on file  . Alcohol Use: No  lives with Jacob Barnes and Jacob Barnes    Review of Systems  Constitutional: Positive for diaphoresis, appetite change and fatigue.  Cardiovascular: Negative for chest pain.  Gastrointestinal: Positive for abdominal pain. Negative for nausea.  Neurological: Negative for syncope and headaches.    Allergies  Review of patient's allergies indicates no known allergies.  Home Medications   Current Outpatient Rx  Name  Route  Sig  Dispense  Refill  . aspirin 81 MG EC tablet   Oral   Take 81 mg by mouth daily.           . benazepril (LOTENSIN) 10 MG tablet   Oral   Take 10 mg by mouth daily.         Marland Kitchen glipiZIDE (GLUCOTROL) 10 MG tablet   Oral   Take 10 mg by mouth  2 (two) times daily.         . insulin glargine (LANTUS) 100 UNIT/ML injection   Subcutaneous   Inject 8 Units into the skin daily.          Marland Kitchen latanoprost (XALATAN) 0.005 % ophthalmic solution   Both Eyes   Place 1 drop into both eyes at bedtime.         . pantoprazole (PROTONIX) 20 MG tablet   Oral   Take 1 tablet (20 mg total) by mouth daily.   30 tablet   1     Triage vitals: BP 118/62  Pulse 68  Temp(Src) 98 F (36.7 C) (Oral)  Resp 16  Ht 5\' 4"  (1.626 m)  Wt 138 lb (62.596 kg)  BMI 23.68 kg/m2  SpO2 100%  Vital signs normal    Physical Exam  Nursing note and vitals reviewed. Constitutional: He is oriented to person, place, and time. He appears well-developed and well-nourished.  Non-toxic appearance. He does not appear ill.  No distress.  HENT:  Head: Normocephalic and atraumatic.  Right Ear: External ear normal.  Left Ear: External ear normal.  Nose: Nose normal. No mucosal edema or rhinorrhea.  Mouth/Throat: Oropharynx is clear and moist and mucous membranes are normal. No dental abscesses or edematous.  Eyes: Conjunctivae and EOM are normal. Pupils are equal, round, and reactive to light.  Neck: Normal range of motion and full passive range of motion without pain. Neck supple.  Cardiovascular: Normal rate, regular rhythm and normal heart sounds.  Exam reveals no gallop and no friction rub.   No murmur heard. Pulmonary/Chest: Effort normal and breath sounds normal. No respiratory distress. He has no wheezes. He has no rhonchi. He has no rales. He exhibits no tenderness and no crepitus.  Abdominal: Soft. Normal appearance and bowel sounds are normal. He exhibits no distension. There is no tenderness. There is no rebound and no guarding.  Musculoskeletal: Normal range of motion. He exhibits no edema and no tenderness.  Moves all extremities well.   Neurological: He is alert and oriented to person, place, and time. He has normal strength. No cranial nerve deficit.  Skin: Skin is warm, dry and intact. No rash noted. No erythema. No pallor.  Psychiatric: He has a normal mood and affect. His speech is normal and behavior is normal. His mood appears not anxious.    ED Course  Procedures (including critical care time)  Medications  gi cocktail (Maalox,Lidocaine,Donnatal) (30 mLs Oral Given 08/31/12 2051)  famotidine (PEPCID) tablet 20 mg (20 mg Oral Given 08/31/12 2051)  glipiZIDE (GLUCOTROL) tablet 10 mg (10 mg Oral Given 09/01/12 0103)    DIAGNOSTIC STUDIES: Oxygen Saturation is 100% on room air, normal by my interpretation.    COORDINATION OF CARE: 8:44 PM-Discussed treatment plan which includes medications, CBC panel, CMP and UA with pt at bedside and pt agreed to plan.   At discharge patient is feeling  better. He states he normally takes his Glucotrol at bedtime. He was given his evening dose of Glucotrol. We discussed keeping a log of his CBGs of the next couple days so he can show it to his physician.  Results for orders placed during the hospital encounter of 08/31/12  GLUCOSE, CAPILLARY      Result Value Range   Glucose-Capillary 378 (*) 70 - 99 mg/dL  CBC WITH DIFFERENTIAL      Result Value Range   WBC 6.4  4.0 - 10.5 K/uL   RBC 3.64 (*)  4.22 - 5.81 MIL/uL   Hemoglobin 11.3 (*) 13.0 - 17.0 g/dL   HCT 16.1 (*) 09.6 - 04.5 %   MCV 89.8  78.0 - 100.0 fL   MCH 31.0  26.0 - 34.0 pg   MCHC 34.6  30.0 - 36.0 g/dL   RDW 40.9  81.1 - 91.4 %   Platelets 117 (*) 150 - 400 K/uL   Neutrophils Relative % 71  43 - 77 %   Neutro Abs 4.5  1.7 - 7.7 K/uL   Lymphocytes Relative 23  12 - 46 %   Lymphs Abs 1.5  0.7 - 4.0 K/uL   Monocytes Relative 6  3 - 12 %   Monocytes Absolute 0.4  0.1 - 1.0 K/uL   Eosinophils Relative 0  0 - 5 %   Eosinophils Absolute 0.0  0.0 - 0.7 K/uL   Basophils Relative 0  0 - 1 %   Basophils Absolute 0.0  0.0 - 0.1 K/uL   Smear Review PLATELET COUNT CONFIRMED BY SMEAR    COMPREHENSIVE METABOLIC PANEL      Result Value Range   Sodium 133 (*) 135 - 145 mEq/L   Potassium 4.4  3.5 - 5.1 mEq/L   Chloride 98  96 - 112 mEq/L   CO2 26  19 - 32 mEq/L   Glucose, Bld 383 (*) 70 - 99 mg/dL   BUN 35 (*) 6 - 23 mg/dL   Creatinine, Ser 7.82 (*) 0.50 - 1.35 mg/dL   Calcium 8.7  8.4 - 95.6 mg/dL   Total Protein 7.5  6.0 - 8.3 g/dL   Albumin 3.1 (*) 3.5 - 5.2 g/dL   AST 25  0 - 37 U/L   ALT 15  0 - 53 U/L   Alkaline Phosphatase 104  39 - 117 U/L   Total Bilirubin 0.2 (*) 0.3 - 1.2 mg/dL   GFR calc non Af Amer 49 (*) >90 mL/min   GFR calc Af Amer 56 (*) >90 mL/min  MAGNESIUM      Result Value Range   Magnesium 2.1  1.5 - 2.5 mg/dL  TROPONIN I      Result Value Range   Troponin I <0.30  <0.30 ng/mL  URINALYSIS, ROUTINE W REFLEX MICROSCOPIC      Result Value Range    Color, Urine YELLOW  YELLOW   APPearance CLEAR  CLEAR   Specific Gravity, Urine 1.025  1.005 - 1.030   pH 6.0  5.0 - 8.0   Glucose, UA >1000 (*) NEGATIVE mg/dL   Hgb urine dipstick TRACE (*) NEGATIVE   Bilirubin Urine NEGATIVE  NEGATIVE   Ketones, ur NEGATIVE  NEGATIVE mg/dL   Protein, ur TRACE (*) NEGATIVE mg/dL   Urobilinogen, UA 0.2  0.0 - 1.0 mg/dL   Nitrite NEGATIVE  NEGATIVE   Leukocytes, UA NEGATIVE  NEGATIVE  URINE MICROSCOPIC-ADD ON      Result Value Range   Squamous Epithelial / LPF RARE  RARE   WBC, UA 0-2  <3 WBC/hpf   RBC / HPF 0-2  <3 RBC/hpf   Bacteria, UA RARE  RARE   Laboratory interpretation all normal except except hyperglycemia, stable renal insufficiency, mild anemia   Date: 08/31/2012  Rate: 65  Rhythm: normal sinus rhythm  QRS Axis: normal  Intervals: normal  ST/T Wave abnormalities: normal  Conduction Disutrbances:none  Narrative Interpretation: LAE,Q waves septally  Old EKG Reviewed: none available    1. Hypoglycemia   2. Leg cramps  New Prescriptions   CYCLOBENZAPRINE (FLEXERIL) 5 MG TABLET    Take 1 tablet (5 mg total) by mouth 3 (three) times daily as needed for muscle spasms.    Plan discharge   Devoria Albe, MD, FACEP    MDM patient had episode of hypoglycemia today after not eating as much as he normally would. He also later in the day he had a lot of cramping in his legs. His potassium, magnesium, and calcium are all normal. We discussed that if he is going to eat less than usual he should cut back his insulin.    I personally performed the services described in this documentation, which was scribed in my presence. The recorded information has been reviewed and considered.  Devoria Albe, MD, Armando Gang        Ward Givens, MD 09/01/12 [phone]

## 2012-09-01 LAB — URINALYSIS, ROUTINE W REFLEX MICROSCOPIC
Bilirubin Urine: NEGATIVE
Glucose, UA: >1000 mg/dL — AB
Ketones, ur: NEGATIVE mg/dL
Specific Gravity, Urine: 1.025 (ref 1.005–1.030)
pH: 6 (ref 5.0–8.0)

## 2012-09-01 LAB — URINE MICROSCOPIC-ADD ON

## 2012-09-01 MED ORDER — GLIPIZIDE 10 MG PO TABS
10.0000 mg | ORAL_TABLET | Freq: Once | ORAL | Status: AC
  Administered 2012-09-01: 10 mg via ORAL
  Filled 2012-09-01: qty 1

## 2012-09-01 MED ORDER — GLIPIZIDE 5 MG PO TABS
ORAL_TABLET | ORAL | Status: AC
  Filled 2012-09-01: qty 2

## 2012-09-01 MED ORDER — CYCLOBENZAPRINE HCL 5 MG PO TABS: 5.0000 mg | ORAL_TABLET | Freq: Three times a day (TID) | ORAL | Status: DC | PRN

## 2012-10-13 ENCOUNTER — Telehealth: Payer: Self-pay | Admitting: Family Medicine

## 2012-10-13 NOTE — Telephone Encounter (Signed)
Pls let pt know of recall in his glucose testing strips, letter from ins company is left at your statation. New strips should be available, ps follow thru. Questions, pls discuss

## 2012-10-18 NOTE — Telephone Encounter (Signed)
Called and left message with family member to get him to call me back

## 2012-10-21 NOTE — Telephone Encounter (Signed)
Is going to bring his meter/strips here when he comes for his appt to see if his strips are affected in the recall. States he never received anything in the mail

## 2012-10-29 ENCOUNTER — Encounter: Payer: Self-pay | Admitting: Family Medicine

## 2012-10-29 ENCOUNTER — Ambulatory Visit (INDEPENDENT_AMBULATORY_CARE_PROVIDER_SITE_OTHER): Payer: PRIVATE HEALTH INSURANCE | Admitting: Family Medicine

## 2012-10-29 VITALS — BP 120/80 | HR 73 | Resp 16 | Ht 65.0 in | Wt 134.4 lb

## 2012-10-29 DIAGNOSIS — IMO0002 Reserved for concepts with insufficient information to code with codable children: Secondary | ICD-10-CM

## 2012-10-29 DIAGNOSIS — E1065 Type 1 diabetes mellitus with hyperglycemia: Secondary | ICD-10-CM

## 2012-10-29 DIAGNOSIS — IMO0001 Reserved for inherently not codable concepts without codable children: Secondary | ICD-10-CM

## 2012-10-29 DIAGNOSIS — K219 Gastro-esophageal reflux disease without esophagitis: Secondary | ICD-10-CM

## 2012-10-29 DIAGNOSIS — Z13220 Encounter for screening for lipoid disorders: Secondary | ICD-10-CM

## 2012-10-29 DIAGNOSIS — Z125 Encounter for screening for malignant neoplasm of prostate: Secondary | ICD-10-CM

## 2012-10-29 DIAGNOSIS — I10 Essential (primary) hypertension: Secondary | ICD-10-CM

## 2012-10-29 LAB — COMPLETE METABOLIC PANEL WITH GFR
ALT: 9 U/L (ref 0–53)
Albumin: 3.9 g/dL (ref 3.5–5.2)
Alkaline Phosphatase: 69 U/L (ref 39–117)
CO2: 29 mEq/L (ref 19–32)
GFR, Est African American: 61 mL/min
GFR, Est Non African American: 53 mL/min — ABNORMAL LOW
Glucose, Bld: 73 mg/dL (ref 70–99)
Potassium: 4.6 mEq/L (ref 3.5–5.3)
Sodium: 140 mEq/L (ref 135–145)
Total Bilirubin: 0.5 mg/dL (ref 0.3–1.2)
Total Protein: 7.8 g/dL (ref 6.0–8.3)

## 2012-10-29 LAB — LIPID PANEL: LDL Cholesterol: 80 mg/dL (ref 0–99)

## 2012-10-29 NOTE — Progress Notes (Signed)
  Subjective:    Patient ID: Jacob Barnes, male    DOB: 07-02-1940, 72 y.o.   MRN: [id number]  HPI  The PT is here for follow up and re-evaluation of chronic medical conditions, medication management and review of any available recent lab and radiology data.  Preventive health is updated, specifically  Cancer screening and Immunization.   Was recently in the ED with blood sugar in the 50's, generally not the case. I explained the importance of eating on a regular schedule, about the same carbohydrate load at the same time every day His son , a diabetic , and a retired Financial risk analyst , is now living with him, and is present at the visit, so hopefully , this will positively impact his daibetic control The PT denies any adverse reactions to current medications since the last visit.  There are no new concerns.      Assessment:      Plan:         Review of Systems See HPI 'Denies recent fever or chills. Denies sinus pressure, nasal congestion, ear pain or sore throat. Denies chest congestion, productive cough or wheezing. Denies chest pains, palpitations and leg swelling Denies abdominal pain, nausea, vomiting,diarrhea or constipation.   Denies dysuria, frequency, hesitancy or incontinence. Denies joint pain, swelling and limitation in mobility. Denies headaches, seizures, numbness, or tingling. Denies depression, anxiety or insomnia. Denies skin break down or rash.        Objective:   Physical Exam  Patient alert and oriented and in no cardiopulmonary distress.  HEENT: No facial asymmetry, EOMI, no sinus tenderness,  oropharynx pink and moist.  Neck supple no adenopathy.  Chest: Clear to auscultation bilaterally.  CVS: S1, S2 no murmurs, no S3.  ABD: Soft non tender. Bowel sounds normal.  Ext: No edema  MS: Adequate ROM spine, shoulders, hips and knees.  Skin: Intact, no ulcerations or rash noted.  Psych: Good eye contact, normal affect. Memory intact not anxious or  depressed appearing.  CNS: CN 2-12 intact, power, tone and sensation normal throughout.       Assessment & Plan:

## 2012-10-29 NOTE — Patient Instructions (Addendum)
F/U with rectal in 4 month  Microalb today  Fasting lipid, cmp and EGFR, TSH, PSA  PLS check the pharmacy for shingles vaccine, you need this  Blood sugar goals fasting are 90 to 140  2 hours after any meal  Or bedtime is 140 to 180  It is very important to eat on a regular schedule

## 2012-10-30 LAB — MICROALBUMIN / CREATININE URINE RATIO
Creatinine, Urine: 158.1 mg/dL
Microalb Creat Ratio: 29.7 mg/g (ref 0.0–30.0)

## 2012-10-30 NOTE — Assessment & Plan Note (Signed)
Controlled, no change in medication  

## 2012-10-30 NOTE — Assessment & Plan Note (Signed)
Uses medication on an intermittent basis only

## 2012-10-30 NOTE — Assessment & Plan Note (Signed)
An extensive amt of time spent in diabetic ed, pt reports improved numbers, we will see what HBA1C is. His somn who is diabetic, and a retired Building surveyor is now at home, so hopefully there should be great improvement

## 2012-10-30 NOTE — Addendum Note (Signed)
Addended by: Abner Greenspan on: 10/30/2012 02:39 PM   Modules accepted: Orders

## 2012-11-07 LAB — HEMOGLOBIN A1C
Hgb A1c MFr Bld: 9.1 % — ABNORMAL HIGH (ref <5.7)
Mean Plasma Glucose: 214 mg/dL — ABNORMAL HIGH (ref <117)

## 2012-12-06 ENCOUNTER — Other Ambulatory Visit: Payer: Self-pay

## 2012-12-06 MED ORDER — GLIPIZIDE 10 MG PO TABS: 10.0000 mg | ORAL_TABLET | Freq: Two times a day (BID) | ORAL | Status: DC

## 2013-02-06 ENCOUNTER — Other Ambulatory Visit: Payer: Self-pay | Admitting: Family Medicine

## 2013-02-13 ENCOUNTER — Other Ambulatory Visit: Payer: Self-pay

## 2013-03-10 ENCOUNTER — Ambulatory Visit: Payer: PRIVATE HEALTH INSURANCE | Admitting: Family Medicine

## 2013-03-12 ENCOUNTER — Telehealth: Payer: Self-pay

## 2013-03-12 NOTE — Telephone Encounter (Signed)
Called and spoke with sister who states that patient is having problems falling asleep during activities like talking.  She is concerned because he is still driving.  Patient was given appointment for 12/4 @ 1:15.  fyi

## 2013-03-12 NOTE — Telephone Encounter (Signed)
Noted  

## 2013-03-13 ENCOUNTER — Ambulatory Visit: Payer: PRIVATE HEALTH INSURANCE | Admitting: Family Medicine

## 2013-03-17 ENCOUNTER — Ambulatory Visit (INDEPENDENT_AMBULATORY_CARE_PROVIDER_SITE_OTHER): Payer: PRIVATE HEALTH INSURANCE | Admitting: Family Medicine

## 2013-03-17 ENCOUNTER — Encounter: Payer: Self-pay | Admitting: Family Medicine

## 2013-03-17 VITALS — BP 126/74 | HR 92 | Resp 18 | Wt 134.0 lb

## 2013-03-17 DIAGNOSIS — Z13220 Encounter for screening for lipoid disorders: Secondary | ICD-10-CM

## 2013-03-17 DIAGNOSIS — F5104 Psychophysiologic insomnia: Secondary | ICD-10-CM

## 2013-03-17 DIAGNOSIS — I10 Essential (primary) hypertension: Secondary | ICD-10-CM

## 2013-03-17 DIAGNOSIS — IMO0002 Reserved for concepts with insufficient information to code with codable children: Secondary | ICD-10-CM

## 2013-03-17 DIAGNOSIS — E1065 Type 1 diabetes mellitus with hyperglycemia: Secondary | ICD-10-CM

## 2013-03-17 DIAGNOSIS — G4700 Insomnia, unspecified: Secondary | ICD-10-CM

## 2013-03-17 DIAGNOSIS — IMO0001 Reserved for inherently not codable concepts without codable children: Secondary | ICD-10-CM

## 2013-03-17 DIAGNOSIS — Z23 Encounter for immunization: Secondary | ICD-10-CM

## 2013-03-17 LAB — COMPLETE METABOLIC PANEL WITH GFR
ALT: 13 U/L (ref 0–53)
AST: 21 U/L (ref 0–37)
CO2: 29 mEq/L (ref 19–32)
Creat: 1.27 mg/dL (ref 0.50–1.35)
Sodium: 137 mEq/L (ref 135–145)
Total Bilirubin: 0.4 mg/dL (ref 0.3–1.2)
Total Protein: 7.4 g/dL (ref 6.0–8.3)

## 2013-03-17 LAB — POC HEMOCCULT BLD/STL (OFFICE/1-CARD/DIAGNOSTIC): Fecal Occult Blood, POC: NEGATIVE

## 2013-03-17 MED ORDER — PRAVASTATIN SODIUM 10 MG PO TABS: 10.0000 mg | ORAL_TABLET | Freq: Every day | ORAL | Status: DC

## 2013-03-17 NOTE — Patient Instructions (Addendum)
F/u in 3 month,call if you need me before  HBA1C and cmp and EGFR today  Flu vaccine today  Rectal today  New for cholesterol to help reduce your risk of heart attack/stroke is pravstatin 1 at bedtime  Lipid, cmp and EGFr and HBA1C in 3.5 month, before next visit

## 2013-03-17 NOTE — Progress Notes (Signed)
   Subjective:    Patient ID: Jacob Barnes, male    DOB: May 01, 1940, 72 y.o.   MRN: [id number]  HPI The PT is here for follow up and re-evaluation of chronic medical conditions, medication management and review of any available recent lab and radiology data.  Preventive health is updated, specifically  Cancer screening and Immunization.   Reports improved blood sugars this visit, his hBa1c today will tell the entire story. His son is no longer living with him, now with his girl friend, so patient is back to getting his own food. The PT denies any adverse reactions to current medications since the last visit.  C/o difficulty falling and staying asleep months, but this is worsening, requests medication for help with this. Denies depression, has chronic anxiety, "highly strung"    Review of Systems See HPI. Denies recent fever or chills. Denies sinus pressure, nasal congestion, ear pain or sore throat. Denies chest congestion, productive cough or wheezing. Denies chest pains, palpitations and leg swelling Denies abdominal pain, nausea, vomiting,diarrhea or constipation.   Denies dysuria, frequency, hesitancy or incontinence. Denies joint pain, swelling and limitation in mobility. Denies headaches, seizures, numbness, or tingling. Denies depression, . Denies skin break down or rash.        Objective:   Physical Exam  Patient alert and oriented and in no cardiopulmonary distress.  HEENT: No facial asymmetry, EOMI, no sinus tenderness,  oropharynx pink and moist.  Neck supple no adenopathy.  Chest: Clear to auscultation bilaterally.  CVS: S1, S2 no murmurs, no S3.  ABD: Soft non tender. Bowel sounds normal.  Ext: No edema  MS: Adequate ROM spine, shoulders, hips and knees.  Skin: Intact, no ulcerations or rash noted.  Psych: Good eye contact, normal affect. Memory intact not anxious or depressed appearing.  CNS: CN 2-12 intact, power, tone and sensation normal  throughout.       Assessment & Plan:

## 2013-03-18 ENCOUNTER — Other Ambulatory Visit: Payer: Self-pay

## 2013-03-18 ENCOUNTER — Encounter: Payer: Self-pay | Admitting: Family Medicine

## 2013-03-18 LAB — HEMOGLOBIN A1C
Hgb A1c MFr Bld: 9.4 % — ABNORMAL HIGH (ref <5.7)
Mean Plasma Glucose: 223 mg/dL — ABNORMAL HIGH (ref <117)

## 2013-03-18 MED ORDER — GLIPIZIDE 10 MG PO TABS: 10.0000 mg | ORAL_TABLET | Freq: Two times a day (BID) | ORAL | Status: DC

## 2013-03-18 MED ORDER — BENAZEPRIL HCL 10 MG PO TABS: ORAL_TABLET | ORAL | Status: DC

## 2013-03-19 ENCOUNTER — Telehealth: Payer: Self-pay | Admitting: Family Medicine

## 2013-03-19 NOTE — Telephone Encounter (Signed)
Medication to help with sleep that I suggest is benadryl OTC 25 mg one at bedtime, start this with that , call back in 1 month if not better. He may have probs with sleep also because of high blood sugar may be urinating more often at night

## 2013-03-20 NOTE — Telephone Encounter (Signed)
Called and left message with wife on recommendation.

## 2013-04-13 ENCOUNTER — Telehealth: Payer: Self-pay | Admitting: Family Medicine

## 2013-04-13 DIAGNOSIS — G4700 Insomnia, unspecified: Secondary | ICD-10-CM

## 2013-04-13 DIAGNOSIS — F5104 Psychophysiologic insomnia: Secondary | ICD-10-CM | POA: Insufficient documentation

## 2013-04-13 NOTE — Assessment & Plan Note (Signed)
Sleep hygiene reviewed, will hold on medication until 2015, with the hoe that restoril ,which , is a safer optio than the alpraxolam that is approved.

## 2013-04-13 NOTE — Assessment & Plan Note (Addendum)
Deteriorated , needs to be followed by endo, pt has remained non compliant with medical treat ment plan, alrwady has nephropathy Add statin therapy

## 2013-04-13 NOTE — Assessment & Plan Note (Signed)
Controlled, no change in medication DASH diet and commitment to daily physical activity for a minimum of 30 minutes discussed and encouraged, as a part of hypertension management. The importance of attaining a healthy weight is also discussed.  

## 2013-04-13 NOTE — Telephone Encounter (Signed)
Pls contact pt or his son, find out pt has been to endo. I recently got a note from that office stating no success in attempting to make contact with pt so he can get appt. Know he needs to see specialist , since blood sugar remains uncontrolled and oto high. Also for his c/o insomnia , no med was prescribed,I  have been holding out to see if restoril is covered/cost. Pls send in restoril 15 mg entered after you speak with him

## 2013-04-14 NOTE — Telephone Encounter (Signed)
Left message with wife for patient to return call

## 2013-04-14 NOTE — Telephone Encounter (Signed)
Called number listed for son.  Number is disconnected.  Will attempt to make contact through numbers listed.

## 2013-04-18 ENCOUNTER — Other Ambulatory Visit: Payer: Self-pay

## 2013-04-18 DIAGNOSIS — G4700 Insomnia, unspecified: Secondary | ICD-10-CM

## 2013-04-18 MED ORDER — TEMAZEPAM 15 MG PO CAPS: 15.0000 mg | ORAL_CAPSULE | Freq: Every evening | ORAL | Status: DC | PRN

## 2013-04-18 NOTE — Telephone Encounter (Signed)
Spoke with wife again and she stated that patient was unavailable.  Asked her to have him return my call today.  Also notified her of medicine sent to pharmacy for insomnia.

## 2013-04-21 NOTE — Telephone Encounter (Signed)
Letter sent for patient to contact office

## 2013-07-07 LAB — LIPID PANEL
Cholesterol: 103 mg/dL (ref 0–200)
HDL: 51 mg/dL (ref >39)
LDL Cholesterol: 45 mg/dL (ref 0–99)
Total CHOL/HDL Ratio: 2 Ratio
Triglycerides: 33 mg/dL (ref <150)
VLDL: 7 mg/dL (ref 0–40)

## 2013-07-07 LAB — COMPREHENSIVE METABOLIC PANEL
ALT: 17 U/L (ref 0–53)
AST: 23 U/L (ref 0–37)
Albumin: 3.7 g/dL (ref 3.5–5.2)
Alkaline Phosphatase: 95 U/L (ref 39–117)
BUN: 15 mg/dL (ref 6–23)
CO2: 31 mEq/L (ref 19–32)
Calcium: 8.6 mg/dL (ref 8.4–10.5)
Chloride: 100 mEq/L (ref 96–112)
Creat: 1.25 mg/dL (ref 0.50–1.35)
Glucose, Bld: 146 mg/dL — ABNORMAL HIGH (ref 70–99)
Potassium: 3.9 mEq/L (ref 3.5–5.3)
Sodium: 137 mEq/L (ref 135–145)
Total Bilirubin: 0.4 mg/dL (ref 0.2–1.2)
Total Protein: 7.2 g/dL (ref 6.0–8.3)

## 2013-07-07 LAB — HEMOGLOBIN A1C
Hgb A1c MFr Bld: 9.1 % — ABNORMAL HIGH (ref <5.7)
Mean Plasma Glucose: 214 mg/dL — ABNORMAL HIGH (ref <117)

## 2013-07-16 ENCOUNTER — Ambulatory Visit (INDEPENDENT_AMBULATORY_CARE_PROVIDER_SITE_OTHER): Payer: PRIVATE HEALTH INSURANCE | Admitting: Family Medicine

## 2013-07-16 ENCOUNTER — Encounter: Payer: Self-pay | Admitting: Family Medicine

## 2013-07-16 VITALS — BP 132/78 | HR 72 | Resp 16 | Wt 138.4 lb

## 2013-07-16 DIAGNOSIS — Z9119 Patient's noncompliance with other medical treatment and regimen: Secondary | ICD-10-CM

## 2013-07-16 DIAGNOSIS — E1165 Type 2 diabetes mellitus with hyperglycemia: Principal | ICD-10-CM

## 2013-07-16 DIAGNOSIS — G4700 Insomnia, unspecified: Secondary | ICD-10-CM

## 2013-07-16 DIAGNOSIS — E782 Mixed hyperlipidemia: Secondary | ICD-10-CM | POA: Insufficient documentation

## 2013-07-16 DIAGNOSIS — Z91199 Patient's noncompliance with other medical treatment and regimen due to unspecified reason: Secondary | ICD-10-CM

## 2013-07-16 DIAGNOSIS — Z794 Long term (current) use of insulin: Principal | ICD-10-CM

## 2013-07-16 DIAGNOSIS — I10 Essential (primary) hypertension: Secondary | ICD-10-CM

## 2013-07-16 DIAGNOSIS — E785 Hyperlipidemia, unspecified: Secondary | ICD-10-CM

## 2013-07-16 DIAGNOSIS — F5104 Psychophysiologic insomnia: Secondary | ICD-10-CM

## 2013-07-16 DIAGNOSIS — Z125 Encounter for screening for malignant neoplasm of prostate: Secondary | ICD-10-CM

## 2013-07-16 DIAGNOSIS — E1065 Type 1 diabetes mellitus with hyperglycemia: Secondary | ICD-10-CM

## 2013-07-16 DIAGNOSIS — IMO0001 Reserved for inherently not codable concepts without codable children: Secondary | ICD-10-CM

## 2013-07-16 DIAGNOSIS — IMO0002 Reserved for concepts with insufficient information to code with codable children: Secondary | ICD-10-CM

## 2013-07-16 MED ORDER — INSULIN GLARGINE 100 UNIT/ML SOLOSTAR PEN: PEN_INJECTOR | SUBCUTANEOUS | Status: DC

## 2013-07-16 MED ORDER — BENAZEPRIL HCL 10 MG PO TABS: ORAL_TABLET | ORAL | Status: DC

## 2013-07-16 NOTE — Assessment & Plan Note (Signed)
Uncontrolled, slight improvement but pt remains non compliant with diet, also states eats irregularly, different times and often goes for prolonged periods without eating Patient advised to reduce carb and sweets, commit to regular physical activity, take meds as prescribed, test blood as directed, and attempt to lose weight, to improve blood sugar control. Increase in insulin to 15 units daily

## 2013-07-16 NOTE — Progress Notes (Signed)
Subjective:    Patient ID: Jacob Barnes, male    DOB: 03/09/1941, 73 y.o.   MRN: [id number]  HPI The PT is here for follow up and re-evaluation of chronic medical conditions, medication management and review of any available recent lab and radiology data.  Preventive health is updated, specifically  Cancer screening and Immunization.   The PT denies any adverse reactions to current medications since the last visit.  There are no new concerns.  There are no specific complaints . Does state tests once daily, fasting blood suagrs are often between 140 to 160. He does admit to being non disci;plined as far as his diet is concerned , and often eats late or misses a meal resulting in episodes of hypoglycemia down to 70's . Tend sot under dose the insulin as a result and continues not to test as he should      Review of Systems See HPI Denies recent fever or chills. Denies sinus pressure, nasal congestion, ear pain or sore throat. Denies chest congestion, productive cough or wheezing. Denies chest pains, palpitations and leg swelling Denies abdominal pain, nausea, vomiting,diarrhea or constipation.   Denies dysuria, frequency, hesitancy or incontinence. Denies joint pain, swelling and limitation in mobility. Denies headaches, seizures, numbness, or tingling. Denies depression, anxiety or insomnia. Denies skin break down or rash.        Objective:   Physical Exam BP 132/78  Pulse 72  Resp 16  Wt 138 lb 6.4 oz (62.778 kg)  SpO2 100% Patient alert and oriented and in no cardiopulmonary distress.  HEENT: No facial asymmetry, EOMI, no sinus tenderness,  oropharynx pink and moist.  Neck supple no adenopathy.  Chest: Clear to auscultation bilaterally.  CVS: S1, S2 no murmurs, no S3.  ABD: Soft non tender. Bowel sounds normal.  Ext: No edema  MS: Adequate ROM spine, shoulders, hips and knees.  Skin: Intact, no ulcerations or rash noted.  Psych: Good eye contact, normal  affect. Memory intact not anxious or depressed appearing.  CNS: CN 2-12 intact, power, tone and sensation normal throughout.        Assessment & Plan:  Diabetes mellitus, insulin dependent (IDDM), uncontrolled Uncontrolled, slight improvement but pt remains non compliant with diet, also states eats irregularly, different times and often goes for prolonged periods without eating Patient advised to reduce carb and sweets, commit to regular physical activity, take meds as prescribed, test blood as directed, and attempt to lose weight, to improve blood sugar control. Increase in insulin to 15 units daily  Chronic insomnia Sleep hygiene reviewed and written information offered also. Prescription sent for  medication needed. Pt reports good response to medication  HYPERTENSION Controlled, no change in medication DASH diet and commitment to daily physical activity for a minimum of 30 minutes discussed and encouraged, as a part of hypertension management. The importance of attaining a healthy weight is also discussed.   Hyperlipidemia Hyperlipidemia:Low fat diet discussed and encouraged.  Controlled, no change in medication   Personal history of noncompliance with medical treatment, presenting hazards to health P has been referred to [facility] for assistance with managing his diabetes as well as to endocrinologist , both on several occasions, to date he has not followed through on either. I have told him at this visit, that if his next HBA1C is not 8 .5 or less he will again be told that his health is best served in the hands of an endocrinologuist as far as diabetes management is concerned  states he will do better

## 2013-07-16 NOTE — Patient Instructions (Signed)
F/u in July 25 or after, call if  You need me before  NEW dose of lantus 15 units every day before lunch  NEED to eat at REGULAR set times to control sugar and not have low sugar spells  Call with concerns  PSA, HBA1C and chem 7 and EGFR 7/22 or after

## 2013-07-16 NOTE — Assessment & Plan Note (Signed)
P has been referred to [facility] for assistance with managing his diabetes as well as to endocrinologist , both on several occasions, to date he has not followed through on either. I have told him at this visit, that if his next HBA1C is not 8 .5 or less he will again be told that his health is best served in the hands of an endocrinologuist as far as diabetes management is concerned states he will do better

## 2013-07-16 NOTE — Assessment & Plan Note (Signed)
Hyperlipidemia:Low fat diet discussed and encouraged.  Controlled, no change in medication   

## 2013-07-16 NOTE — Assessment & Plan Note (Signed)
Controlled, no change in medication DASH diet and commitment to daily physical activity for a minimum of 30 minutes discussed and encouraged, as a part of hypertension management. The importance of attaining a healthy weight is also discussed.  

## 2013-07-16 NOTE — Assessment & Plan Note (Signed)
Sleep hygiene reviewed and written information offered also. Prescription sent for  medication needed. Pt reports good response to medication

## 2013-09-28 ENCOUNTER — Other Ambulatory Visit: Payer: Self-pay | Admitting: Family Medicine

## 2013-10-18 ENCOUNTER — Other Ambulatory Visit: Payer: Self-pay | Admitting: Family Medicine

## 2013-10-29 LAB — BASIC METABOLIC PANEL WITH GFR
BUN: 25 mg/dL — AB (ref 6–23)
CHLORIDE: 108 meq/L (ref 96–112)
CO2: 28 meq/L (ref 19–32)
CREATININE: 1.42 mg/dL — AB (ref 0.50–1.35)
Calcium: 9 mg/dL (ref 8.4–10.5)
GFR, Est African American: 56 mL/min — ABNORMAL LOW
GFR, Est Non African American: 48 mL/min — ABNORMAL LOW
Glucose, Bld: 140 mg/dL — ABNORMAL HIGH (ref 70–99)
POTASSIUM: 4.2 meq/L (ref 3.5–5.3)
Sodium: 141 mEq/L (ref 135–145)

## 2013-10-29 LAB — HEMOGLOBIN A1C
Hgb A1c MFr Bld: 9.1 % — ABNORMAL HIGH (ref <5.7)
MEAN PLASMA GLUCOSE: 214 mg/dL — AB (ref <117)

## 2013-10-30 LAB — PSA, MEDICARE: PSA: 1.13 ng/mL

## 2013-11-03 ENCOUNTER — Encounter: Payer: Self-pay | Admitting: Family Medicine

## 2013-11-03 ENCOUNTER — Ambulatory Visit (INDEPENDENT_AMBULATORY_CARE_PROVIDER_SITE_OTHER): Payer: PRIVATE HEALTH INSURANCE | Admitting: Family Medicine

## 2013-11-03 VITALS — BP 130/82 | HR 71 | Resp 16 | Ht 65.25 in | Wt 136.0 lb

## 2013-11-03 DIAGNOSIS — IMO0001 Reserved for inherently not codable concepts without codable children: Secondary | ICD-10-CM

## 2013-11-03 DIAGNOSIS — Z794 Long term (current) use of insulin: Principal | ICD-10-CM

## 2013-11-03 DIAGNOSIS — IMO0002 Reserved for concepts with insufficient information to code with codable children: Secondary | ICD-10-CM

## 2013-11-03 DIAGNOSIS — E1165 Type 2 diabetes mellitus with hyperglycemia: Principal | ICD-10-CM

## 2013-11-03 DIAGNOSIS — N183 Chronic kidney disease, stage 3 unspecified: Secondary | ICD-10-CM

## 2013-11-03 DIAGNOSIS — E1065 Type 1 diabetes mellitus with hyperglycemia: Secondary | ICD-10-CM

## 2013-11-03 DIAGNOSIS — Z23 Encounter for immunization: Secondary | ICD-10-CM | POA: Insufficient documentation

## 2013-11-03 DIAGNOSIS — Z111 Encounter for screening for respiratory tuberculosis: Secondary | ICD-10-CM

## 2013-11-03 DIAGNOSIS — E785 Hyperlipidemia, unspecified: Secondary | ICD-10-CM

## 2013-11-03 DIAGNOSIS — I10 Essential (primary) hypertension: Secondary | ICD-10-CM

## 2013-11-03 MED ORDER — INSULIN GLARGINE 100 UNIT/ML SOLOSTAR PEN: 20.0000 [IU] | PEN_INJECTOR | Freq: Every day | SUBCUTANEOUS | Status: DC

## 2013-11-03 NOTE — Patient Instructions (Addendum)
F/u last week in August and BRING meter, log book and medication please.  Sister to come with you also  Prevnar today  Start eating every day regulary in a healthy way, so that your blood sugar improves, it remains uncontrolled  Increase the lantus to 20 units daily, and continue glipizide 10mg  one twice daily  You are again being referred for help at home with your diabetes, your sister will be contacted to make the arrangeemnt and she will be present as she is being responsible for assisting you with your care  Check blood sugar at least two times daily (upm to 3 times is OK) Goal for fasting blood sugar ranges from 100 to 130 and 2 hours after any meal or at bedtime should be between 140 to 190.

## 2013-11-03 NOTE — Assessment & Plan Note (Signed)
Remains uncontrolled, pt remains non com[pliant with diet , eats sporadically when hungry. In with sister today.ister to take responsibility for home monitoring and regular diet Interested in nirse visit for help with mangement and sister is to be the contact person Pt was referred in the past to endocrine, has not kept any appt I explain to both present that his health is deteriorating since blood sugar remains uncontrolled Rest twice daily, record bring meter and log Increase lantus to 20 units daily

## 2013-11-03 NOTE — Progress Notes (Signed)
   Subjective:    Patient ID: Jacob Barnes, male    DOB: 1941/03/27, 73 y.o.   MRN: [id number]  HPI The PT is here for follow up and re-evaluation of chronic medical conditions, medication management and review of any available recent lab and radiology data.  Preventive health is updated, specifically   Immunization.    The PT denies any adverse reactions to current medications since the last visit.  Blood sugars remain elevated and uncontrolled, he is here with his sister who states she will help with helping him with his blood sugar management       Review of Systems See HPI Denies recent fever or chills.c/o fatigue, excessive thirst and urination Denies sinus pressure, nasal congestion, ear pain or sore throat. Denies chest congestion, productive cough or wheezing. Denies chest pains, palpitations and leg swelling Denies abdominal pain, nausea, vomiting,diarrhea or constipation.   Denies dysuria, frequency, hesitancy or incontinence. Denies joint pain, swelling and limitation in mobility. Denies headaches, seizures, numbness, or tingling. Denies depression, anxiety or insomnia. Denies skin break down or rash.        Objective:   Physical Exam BP 130/82  Pulse 71  Resp 16  Ht 5' 5.25" (1.657 m)  Wt 136 lb (61.689 kg)  BMI 22.47 kg/m2  SpO2 100% Patient alert and oriented and in no cardiopulmonary distress.  HEENT: No facial asymmetry, EOMI,   oropharynx pink and moist.  Neck supple no JVD, no mass.  Chest: Clear to auscultation bilaterally.  CVS: S1, S2 no murmurs, no S3.Regular rate.  ABD: Soft non tender.   Ext: No edema  MS: Adequate ROM spine, shoulders, hips and knees.  Skin: Intact, no ulcerations or rash noted.  Psych: Good eye contact, normal affect. Memory intact not anxious or depressed appearing.  CNS: CN 2-12 intact, power,  normal throughout.no focal deficits noted.        Assessment & Plan:  Diabetes mellitus, insulin dependent (IDDM),  uncontrolled Remains uncontrolled, pt remains non com[pliant with diet , eats sporadically when hungry. In with sister today.ister to take responsibility for home monitoring and regular diet Interested in nirse visit for help with mangement and sister is to be the contact person Pt was referred in the past to endocrine, has not kept any appt I explain to both present that his health is deteriorating since blood sugar remains uncontrolled Rest twice daily, record bring meter and log Increase lantus to 20 units daily  Need for vaccination with 13-polyvalent pneumococcal conjugate vaccine Vaccine administered at thsi vist  HYPERTENSION Controlled, no change in medication   CKD (chronic kidney disease) stage 3, GFR 30-59 ml/min Stressed the importance of controlling his blood sugars to improve this  Hyperlipidemia Controlled, no change in medication Hyperlipidemia:Low fat diet discussed and encouraged.    Need for vaccination with 13-polyvalent pneumococcal conjugate vaccine Vaccine administered at visit.   Encounter for PPD test Test placed, pt to return in 48 to 72 hours for  This to be read

## 2013-11-03 NOTE — Assessment & Plan Note (Signed)
Vaccine administered at [employer]

## 2013-11-04 LAB — MICROALBUMIN / CREATININE URINE RATIO
Creatinine, Urine: 97.9 mg/dL
MICROALB UR: 6.74 mg/dL — AB (ref 0.00–1.89)
Microalb Creat Ratio: 68.8 mg/g — ABNORMAL HIGH (ref 0.0–30.0)

## 2013-11-05 LAB — TB SKIN TEST
Induration: 0 mm
TB Skin Test: NEGATIVE

## 2013-11-21 ENCOUNTER — Other Ambulatory Visit: Payer: Self-pay

## 2013-11-21 MED ORDER — GLIPIZIDE 10 MG PO TABS: ORAL_TABLET | ORAL | Status: DC

## 2013-12-01 ENCOUNTER — Encounter: Payer: Self-pay | Admitting: Family Medicine

## 2013-12-01 ENCOUNTER — Ambulatory Visit (INDEPENDENT_AMBULATORY_CARE_PROVIDER_SITE_OTHER): Payer: PRIVATE HEALTH INSURANCE | Admitting: Family Medicine

## 2013-12-01 VITALS — BP 132/78 | HR 80 | Resp 16 | Ht 65.25 in | Wt 136.1 lb

## 2013-12-01 DIAGNOSIS — N183 Chronic kidney disease, stage 3 unspecified: Secondary | ICD-10-CM

## 2013-12-01 DIAGNOSIS — Z794 Long term (current) use of insulin: Principal | ICD-10-CM

## 2013-12-01 DIAGNOSIS — E785 Hyperlipidemia, unspecified: Secondary | ICD-10-CM

## 2013-12-01 DIAGNOSIS — E1165 Type 2 diabetes mellitus with hyperglycemia: Principal | ICD-10-CM

## 2013-12-01 DIAGNOSIS — I10 Essential (primary) hypertension: Secondary | ICD-10-CM

## 2013-12-01 DIAGNOSIS — E1065 Type 1 diabetes mellitus with hyperglycemia: Secondary | ICD-10-CM

## 2013-12-01 DIAGNOSIS — IMO0001 Reserved for inherently not codable concepts without codable children: Secondary | ICD-10-CM

## 2013-12-01 DIAGNOSIS — IMO0002 Reserved for concepts with insufficient information to code with codable children: Secondary | ICD-10-CM

## 2013-12-01 MED ORDER — BENAZEPRIL HCL 10 MG PO TABS: ORAL_TABLET | ORAL | Status: DC

## 2013-12-01 MED ORDER — PRAVASTATIN SODIUM 10 MG PO TABS: ORAL_TABLET | ORAL | Status: DC

## 2013-12-01 NOTE — Patient Instructions (Addendum)
F/u last of early November, call if you need me before  KEEP checking  Blood sugars 2 times every day  Keep eating 3 set meals every day AND NIGHT TIME SNACK  GLIPIZIDE WITH BREAKfaST AND WITH DINNER  New dose lantus 20 units every bedtime   Pls come to Sept class  HBa1C , fasting lipid cmp and EGFR, and CBC

## 2013-12-10 LAB — HM DIABETES EYE EXAM

## 2013-12-11 ENCOUNTER — Observation Stay (HOSPITAL_COMMUNITY): Payer: PRIVATE HEALTH INSURANCE

## 2013-12-11 ENCOUNTER — Encounter (HOSPITAL_COMMUNITY): Payer: Self-pay | Admitting: Emergency Medicine

## 2013-12-11 ENCOUNTER — Inpatient Hospital Stay (HOSPITAL_COMMUNITY)
Admission: EM | Admit: 2013-12-11 | Discharge: 2013-12-13 | DRG: 683 | Disposition: A | Discharge status: Home / Self Care | Payer: PRIVATE HEALTH INSURANCE | Attending: Family Medicine | Admitting: Family Medicine

## 2013-12-11 DIAGNOSIS — E441 Mild protein-calorie malnutrition: Secondary | ICD-10-CM | POA: Diagnosis present

## 2013-12-11 DIAGNOSIS — N183 Chronic kidney disease, stage 3 unspecified: Secondary | ICD-10-CM | POA: Diagnosis present

## 2013-12-11 DIAGNOSIS — D649 Anemia, unspecified: Secondary | ICD-10-CM

## 2013-12-11 DIAGNOSIS — R5381 Other malaise: Secondary | ICD-10-CM | POA: Diagnosis not present

## 2013-12-11 DIAGNOSIS — E875 Hyperkalemia: Secondary | ICD-10-CM

## 2013-12-11 DIAGNOSIS — Z794 Long term (current) use of insulin: Secondary | ICD-10-CM

## 2013-12-11 DIAGNOSIS — E1065 Type 1 diabetes mellitus with hyperglycemia: Secondary | ICD-10-CM

## 2013-12-11 DIAGNOSIS — N179 Acute kidney failure, unspecified: Secondary | ICD-10-CM | POA: Diagnosis not present

## 2013-12-11 DIAGNOSIS — I951 Orthostatic hypotension: Secondary | ICD-10-CM

## 2013-12-11 DIAGNOSIS — Z8249 Family history of ischemic heart disease and other diseases of the circulatory system: Secondary | ICD-10-CM

## 2013-12-11 DIAGNOSIS — D696 Thrombocytopenia, unspecified: Secondary | ICD-10-CM | POA: Diagnosis present

## 2013-12-11 DIAGNOSIS — R531 Weakness: Secondary | ICD-10-CM | POA: Diagnosis present

## 2013-12-11 DIAGNOSIS — E1165 Type 2 diabetes mellitus with hyperglycemia: Secondary | ICD-10-CM | POA: Diagnosis present

## 2013-12-11 DIAGNOSIS — K219 Gastro-esophageal reflux disease without esophagitis: Secondary | ICD-10-CM | POA: Diagnosis present

## 2013-12-11 DIAGNOSIS — R5383 Other fatigue: Secondary | ICD-10-CM | POA: Diagnosis not present

## 2013-12-11 DIAGNOSIS — I10 Essential (primary) hypertension: Secondary | ICD-10-CM | POA: Diagnosis present

## 2013-12-11 DIAGNOSIS — IMO0001 Reserved for inherently not codable concepts without codable children: Secondary | ICD-10-CM

## 2013-12-11 DIAGNOSIS — E785 Hyperlipidemia, unspecified: Secondary | ICD-10-CM | POA: Diagnosis present

## 2013-12-11 DIAGNOSIS — Z9181 History of falling: Secondary | ICD-10-CM

## 2013-12-11 DIAGNOSIS — Z833 Family history of diabetes mellitus: Secondary | ICD-10-CM

## 2013-12-11 DIAGNOSIS — N390 Urinary tract infection, site not specified: Secondary | ICD-10-CM | POA: Diagnosis present

## 2013-12-11 DIAGNOSIS — IMO0002 Reserved for concepts with insufficient information to code with codable children: Secondary | ICD-10-CM | POA: Diagnosis present

## 2013-12-11 DIAGNOSIS — E860 Dehydration: Secondary | ICD-10-CM

## 2013-12-11 DIAGNOSIS — E46 Unspecified protein-calorie malnutrition: Secondary | ICD-10-CM | POA: Diagnosis present

## 2013-12-11 DIAGNOSIS — I129 Hypertensive chronic kidney disease with stage 1 through stage 4 chronic kidney disease, or unspecified chronic kidney disease: Secondary | ICD-10-CM | POA: Diagnosis present

## 2013-12-11 HISTORY — DX: Hyperlipidemia, unspecified: E78.5

## 2013-12-11 LAB — BASIC METABOLIC PANEL
ANION GAP: 11 (ref 5–15)
Anion gap: 12 (ref 5–15)
BUN: 59 mg/dL — ABNORMAL HIGH (ref 6–23)
BUN: 57 mg/dL — AB (ref 6–23)
CHLORIDE: 100 meq/L (ref 96–112)
CO2: 23 meq/L (ref 19–32)
CO2: 21 mEq/L (ref 19–32)
Calcium: 9.1 mg/dL (ref 8.4–10.5)
Calcium: 8.6 mg/dL (ref 8.4–10.5)
Chloride: 103 mEq/L (ref 96–112)
Creatinine, Ser: 1.55 mg/dL — ABNORMAL HIGH (ref 0.50–1.35)
Creatinine, Ser: 1.48 mg/dL — ABNORMAL HIGH (ref 0.50–1.35)
GFR calc Af Amer: 49 mL/min — ABNORMAL LOW (ref >90)
GFR calc non Af Amer: 42 mL/min — ABNORMAL LOW (ref >90)
GFR, EST AFRICAN AMERICAN: 52 mL/min — AB (ref >90)
GFR, EST NON AFRICAN AMERICAN: 45 mL/min — AB (ref >90)
GLUCOSE: 406 mg/dL — AB (ref 70–99)
Glucose, Bld: 482 mg/dL — ABNORMAL HIGH (ref 70–99)
Potassium: 6 mEq/L — ABNORMAL HIGH (ref 3.7–5.3)
Potassium: 5.3 mEq/L (ref 3.7–5.3)
Sodium: 134 mEq/L — ABNORMAL LOW (ref 137–147)
Sodium: 136 mEq/L — ABNORMAL LOW (ref 137–147)

## 2013-12-11 LAB — CBC WITH DIFFERENTIAL/PLATELET
Basophils Absolute: 0 10*3/uL (ref 0.0–0.1)
Basophils Relative: 0 % (ref 0–1)
EOS PCT: 0 % (ref 0–5)
Eosinophils Absolute: 0 10*3/uL (ref 0.0–0.7)
HEMATOCRIT: 32.5 % — AB (ref 39.0–52.0)
Hemoglobin: 11.1 g/dL — ABNORMAL LOW (ref 13.0–17.0)
LYMPHS ABS: 1.5 10*3/uL (ref 0.7–4.0)
LYMPHS PCT: 19 % (ref 12–46)
MCH: 31.5 pg (ref 26.0–34.0)
MCHC: 34.2 g/dL (ref 30.0–36.0)
MCV: 92.3 fL (ref 78.0–100.0)
MONOS PCT: 11 % (ref 3–12)
Monocytes Absolute: 0.8 10*3/uL (ref 0.1–1.0)
NEUTROS ABS: 5.6 10*3/uL (ref 1.7–7.7)
Neutrophils Relative %: 70 % (ref 43–77)
Platelets: 136 10*3/uL — ABNORMAL LOW (ref 150–400)
RBC: 3.52 MIL/uL — AB (ref 4.22–5.81)
RDW: 14.1 % (ref 11.5–15.5)
WBC: 8 10*3/uL (ref 4.0–10.5)

## 2013-12-11 LAB — URINALYSIS, ROUTINE W REFLEX MICROSCOPIC
BILIRUBIN URINE: NEGATIVE
Glucose, UA: >1000 mg/dL — AB
Ketones, ur: NEGATIVE mg/dL
LEUKOCYTES UA: NEGATIVE
NITRITE: NEGATIVE
PH: 5 (ref 5.0–8.0)
Protein, ur: NEGATIVE mg/dL
SPECIFIC GRAVITY, URINE: 1.01 (ref 1.005–1.030)
Urobilinogen, UA: 0.2 mg/dL (ref 0.0–1.0)

## 2013-12-11 LAB — HEPATIC FUNCTION PANEL
ALT: 12 U/L (ref 0–53)
AST: 19 U/L (ref 0–37)
Albumin: 3.4 g/dL — ABNORMAL LOW (ref 3.5–5.2)
Alkaline Phosphatase: 94 U/L (ref 39–117)
TOTAL PROTEIN: 7.8 g/dL (ref 6.0–8.3)
Total Bilirubin: 0.2 mg/dL — ABNORMAL LOW (ref 0.3–1.2)

## 2013-12-11 LAB — GLUCOSE, CAPILLARY: Glucose-Capillary: 228 mg/dL — ABNORMAL HIGH (ref 70–99)

## 2013-12-11 LAB — URINE MICROSCOPIC-ADD ON

## 2013-12-11 LAB — CBG MONITORING, ED
GLUCOSE-CAPILLARY: 468 mg/dL — AB (ref 70–99)
Glucose-Capillary: 402 mg/dL — ABNORMAL HIGH (ref 70–99)

## 2013-12-11 LAB — LIPASE, BLOOD: Lipase: 57 U/L (ref 11–59)

## 2013-12-11 LAB — CK: Total CK: 114 U/L (ref 7–232)

## 2013-12-11 LAB — TROPONIN I

## 2013-12-11 MED ORDER — HEPARIN SODIUM (PORCINE) 5000 UNIT/ML IJ SOLN
5000.0000 [IU] | Freq: Three times a day (TID) | INTRAMUSCULAR | Status: DC
  Administered 2013-12-12 – 2013-12-13 (×5): 5000 [IU] via SUBCUTANEOUS
  Filled 2013-12-11 (×3): qty 1

## 2013-12-11 MED ORDER — PANTOPRAZOLE SODIUM 40 MG IV SOLR
40.0000 mg | Freq: Once | INTRAVENOUS | Status: AC
  Administered 2013-12-11: 40 mg via INTRAVENOUS
  Filled 2013-12-11: qty 40

## 2013-12-11 MED ORDER — INSULIN GLARGINE 100 UNIT/ML ~~LOC~~ SOLN
20.0000 [IU] | Freq: Every day | SUBCUTANEOUS | Status: DC
  Administered 2013-12-12: 20 [IU] via SUBCUTANEOUS
  Filled 2013-12-11 (×4): qty 0.2

## 2013-12-11 MED ORDER — ONDANSETRON HCL 4 MG/2ML IJ SOLN
INTRAMUSCULAR | Status: AC
  Filled 2013-12-11: qty 2

## 2013-12-11 MED ORDER — SODIUM CHLORIDE 0.9 % IV BOLUS (SEPSIS)
500.0000 mL | Freq: Once | INTRAVENOUS | Status: AC
  Administered 2013-12-11: 500 mL via INTRAVENOUS

## 2013-12-11 MED ORDER — SODIUM CHLORIDE 0.9 % IV SOLN
Freq: Once | INTRAVENOUS | Status: AC
Start: 2013-12-11 — End: 2013-12-11
  Administered 2013-12-11: 21:00:00 via INTRAVENOUS

## 2013-12-11 MED ORDER — LATANOPROST 0.005 % OP SOLN
1.0000 [drp] | Freq: Every day | OPHTHALMIC | Status: DC
  Administered 2013-12-12 (×2): 1 [drp] via OPHTHALMIC
  Filled 2013-12-11: qty 2.5

## 2013-12-11 MED ORDER — METOCLOPRAMIDE HCL 5 MG/ML IJ SOLN: 10.0000 mg | Freq: Once | INTRAMUSCULAR | Status: DC

## 2013-12-11 MED ORDER — ONDANSETRON HCL 4 MG/2ML IJ SOLN
4.0000 mg | Freq: Once | INTRAMUSCULAR | Status: AC
  Administered 2013-12-11: 4 mg via INTRAVENOUS

## 2013-12-11 MED ORDER — INSULIN ASPART 100 UNIT/ML ~~LOC~~ SOLN
0.0000 [IU] | Freq: Three times a day (TID) | SUBCUTANEOUS | Status: DC
  Administered 2013-12-12: 2 [IU] via SUBCUTANEOUS
  Administered 2013-12-12: 3 [IU] via SUBCUTANEOUS
  Administered 2013-12-12: 5 [IU] via SUBCUTANEOUS
  Administered 2013-12-13: 2 [IU] via SUBCUTANEOUS

## 2013-12-11 MED ORDER — INSULIN ASPART 100 UNIT/ML ~~LOC~~ SOLN: 0.0000 [IU] | Freq: Every day | SUBCUTANEOUS | Status: DC

## 2013-12-11 MED ORDER — SODIUM CHLORIDE 0.9 % IV SOLN
INTRAVENOUS | Status: DC
  Administered 2013-12-11 – 2013-12-12 (×3): via INTRAVENOUS

## 2013-12-11 MED ORDER — TOBRAMYCIN-DEXAMETHASONE 0.3-0.1 % OP SUSP
1.0000 [drp] | Freq: Four times a day (QID) | OPHTHALMIC | Status: DC
  Administered 2013-12-12 – 2013-12-13 (×4): 1 [drp] via OPHTHALMIC
  Filled 2013-12-11: qty 2.5

## 2013-12-11 MED ORDER — SIMVASTATIN 10 MG PO TABS: 5.0000 mg | ORAL_TABLET | Freq: Every day | ORAL | Status: DC

## 2013-12-11 MED ORDER — INSULIN GLARGINE 100 UNIT/ML ~~LOC~~ SOLN
20.0000 [IU] | Freq: Every day | SUBCUTANEOUS | Status: DC
  Filled 2013-12-11: qty 0.2

## 2013-12-11 MED ORDER — ACETAMINOPHEN 325 MG PO TABS: 650.0000 mg | ORAL_TABLET | Freq: Four times a day (QID) | ORAL | Status: DC | PRN

## 2013-12-11 MED ORDER — METOCLOPRAMIDE HCL 5 MG/ML IJ SOLN
5.0000 mg | Freq: Once | INTRAMUSCULAR | Status: AC
  Administered 2013-12-11: 5 mg via INTRAVENOUS
  Filled 2013-12-11: qty 2

## 2013-12-11 MED ORDER — PANTOPRAZOLE SODIUM 40 MG PO TBEC
40.0000 mg | DELAYED_RELEASE_TABLET | Freq: Every day | ORAL | Status: DC
  Administered 2013-12-12 – 2013-12-13 (×2): 40 mg via ORAL
  Filled 2013-12-11 (×2): qty 1

## 2013-12-11 MED ORDER — ONDANSETRON HCL 4 MG/2ML IJ SOLN: 4.0000 mg | Freq: Four times a day (QID) | INTRAMUSCULAR | Status: DC | PRN

## 2013-12-11 MED ORDER — ONDANSETRON HCL 4 MG PO TABS: 4.0000 mg | ORAL_TABLET | Freq: Four times a day (QID) | ORAL | Status: DC | PRN

## 2013-12-11 MED ORDER — ACETAMINOPHEN 650 MG RE SUPP: 650.0000 mg | Freq: Four times a day (QID) | RECTAL | Status: DC | PRN

## 2013-12-11 MED ORDER — INSULIN ASPART 100 UNIT/ML ~~LOC~~ SOLN
6.0000 [IU] | Freq: Once | SUBCUTANEOUS | Status: AC
  Administered 2013-12-11: 6 [IU] via SUBCUTANEOUS
  Filled 2013-12-11: qty 1

## 2013-12-11 MED ORDER — SODIUM CHLORIDE 0.9 % IJ SOLN
3.0000 mL | Freq: Two times a day (BID) | INTRAMUSCULAR | Status: DC
  Administered 2013-12-12: 3 mL via INTRAVENOUS

## 2013-12-11 MED ORDER — ASPIRIN EC 81 MG PO TBEC
81.0000 mg | DELAYED_RELEASE_TABLET | Freq: Every day | ORAL | Status: DC
  Administered 2013-12-12 – 2013-12-13 (×2): 81 mg via ORAL
  Filled 2013-12-11 (×2): qty 1

## 2013-12-11 NOTE — ED Notes (Signed)
PT has had upper abdominal pain with generalized weakness and abnormal blood glucose x1 day. PT denies any n/v/d. CBG 468 in triage.

## 2013-12-11 NOTE — ED Notes (Signed)
Pt. Vomiting, Dr. Jeneen Rinks notified.

## 2013-12-11 NOTE — H&P (Addendum)
[facility] History and Physical  Jacob Barnes BTD:[id number] DOB: 09-Sep-1940 DOA: 12/11/2013  Referring physician: Dr. Tanna Furry PCP: [name], MD   Chief Complaint:  Weakness  HPI:  73 year old male with history of hypertension, insulin-dependent diabetes mellitus, hyperlipidemia who was brought to the ED by his family after he was found to be sleepy all day since yesterday. This morning his daughter found him slump on the floor and appeared diaphoretic and very weak. He also appeared lethargic. As per his sister at bedside who took  him to his ophthalmology appointment yesterday, she had noticed him to be sleepy all day yesterday as well as today and participating very little in conversation . As per daughter, he has been out in the yard during the day cutting grass past 2 days and also driving his car without air conditioning. She also reports that his appetite has been poor. Patient had similar fall 3 weeks back and was found to be sweaty. Daughter is not aware about his blood glucose levels. Patient reports that they usually run around 100-120 but was in 200s today. In the ED patient had an episode of nausea with vomiting of clear liquids. He also complained of some epigastric discomfort.  Patient denies headache, dizziness, fever, chills,  chest pain, palpitations, SOB, abdominal pain, bowel or urinary symptoms. Daughter thinks that he may have lost some weight over past few months and has poor appetite.  Course in the ED Patient's HR was in low  100s. Blood work done showed hemoglobin of 11.1, platelets are 136, sodium of 134, potassium of 6, chloride of 100, CO2 23, BUN of 59 and creatinine 1.55. The glucose was 42. Troponin was negative. EKG showed normal sinus rhythm. Lipase and LFTs were normal. Given 5 cc normal saline and 6 units of regular insulin. Repeat blood culture potassium of 5.3, BUN of 57 and creatinine 1.48 today. Blood glucose was 406. UA was  unremarkable. Orthostasis was checked and was significantly positive.  hospitalist admission requested to observation telemetry.  Review of Systems:  Constitutional: Denies fever, chills, diaphoresis, appetite change and fatigue.  HEENT: Denies photophobia, eye pain,   trouble swallowing, neck pain,  Respiratory: Denies SOB, DOE, cough, chest tightness,  and wheezing.   Cardiovascular: Denies chest pain, palpitations and leg swelling.  Gastrointestinal: nausea, vomiting, epigastric pain,, denies diarrhea, constipation, blood in stool and abdominal distention.  Genitourinary: Denies dysuria, urgency, frequency, hematuria, flank pain and difficulty urinating.  Endocrine: Denies: hot or cold intolerance,polyuria, polydipsia. Musculoskeletal: Denies myalgias, back pain,  and gait problem.  Skin: Denies pallor, rash and wound.  Neurological: Weakness, Denies dizziness, seizures, syncope,light-headedness, numbness and headaches.  Psychiatric/Behavioral: Increased sleepiness, denies confusion  Past Medical History  Diagnosis Date  . Hypertension   . Diabetes mellitus   . Hyperlipemia    Past Surgical History  Procedure Laterality Date  . Appendectomy    . Cystoscopy/retrograde/ureteroscopy/stone extraction with basket    . Colonoscopy  09/06/2011    Procedure: COLONOSCOPY;  Surgeon: Rogene Houston, MD;  [facility];  Service: Endoscopy;  Laterality: N/A;  830   Social History:  reports that he has never smoked. He does not have any smokeless tobacco history on file. He reports that he does not drink alcohol or use illicit drugs.  No Known Allergies  Family History  Problem Relation Age of Onset  . Diabetes Mother   . Diabetes Father   . Heart disease Father   . Diabetes Brother   .  Diabetes Brother   . Heart disease Brother     Prior to Admission medications   Medication Sig Start Date End Date Taking? Authorizing Provider  aspirin 81 MG EC tablet Take 81 mg by  mouth daily.     Yes Historical Provider, MD  benazepril (LOTENSIN) 10 MG tablet Take 10 mg by mouth daily.   Yes Historical Provider, MD  glipiZIDE (GLUCOTROL) 10 MG tablet Take 10 mg by mouth 2 (two) times daily.   Yes Historical Provider, MD  Insulin Glargine (LANTUS SOLOSTAR) 100 UNIT/ML Solostar Pen Inject 20 Units into the skin daily at 10 pm. 11/03/13  Yes Fayrene Helper, MD  latanoprost (XALATAN) 0.005 % ophthalmic solution Place 1 drop into both eyes at bedtime.   Yes Historical Provider, MD  pravastatin (PRAVACHOL) 10 MG tablet Take 10 mg by mouth at bedtime.   Yes Historical Provider, MD  tobramycin-dexamethasone [facility]) ophthalmic solution Place 1 drop into the right eye 4 (four) times daily.   Yes Historical Provider, MD     Physical Exam:  Filed Vitals:   12/11/13 1841 12/11/13 1938 12/11/13 2015  BP: 123/66 115/82   Pulse: 100 92 88  Temp: 99.1 F (37.3 C)    TempSrc: Oral    Resp: 18 10 14   Height: 5\' 5"  (1.651 m)    Weight: 61.689 kg (136 lb)    SpO2: 100% 100% 98%    Constitutional: Vital signs reviewed.  Elderly thin built male in no acute distress HEENT: no pallor, no icterus, dry oral mucosa, temporal wasting Cardiovascular: RRR, S1 normal, S2 normal, no MRG Chest: CTAB, no wheezes, rales, or rhonchi Abdominal: Soft. Mild Epigastric tenderness, non-distended, bowel sounds are normal, Ext: warm, no edema Neurological: Alert and oriented, nonfocal  Labs on Admission:  Basic Metabolic Panel:  Recent Labs Lab 12/11/13 1948  NA 134*  K 6.0*  CL 100  CO2 23  GLUCOSE 482*  BUN 59*  CREATININE 1.55*  CALCIUM 9.1   Liver Function Tests:  Recent Labs Lab 12/11/13 1948  AST 19  ALT 12  ALKPHOS 94  BILITOT 0.2*  PROT 7.8  ALBUMIN 3.4*    Recent Labs Lab 12/11/13 1948  LIPASE 57   No results found for this basename: AMMONIA,  in the last 168 hours CBC:  Recent Labs Lab 12/11/13 1948  WBC 8.0  NEUTROABS 5.6  HGB 11.1*  HCT 32.5*   MCV 92.3  PLT 136*   Cardiac Enzymes:  Recent Labs Lab 12/11/13 1948  TROPONINI <0.30   BNP: No components found with this basename: POCBNP,  CBG:  Recent Labs Lab 12/11/13 1837 12/11/13 2039  GLUCAP 468* 402*    Radiological Exams on Admission: No results found.  EKG: Sinus rhythm, no ST-T changes  Assessment/Plan  Principal Problem:  Generalized Weakness with orthostatic hypotension Possibly related to dehydration  Monitor under telemetry IV hydration with normal saline. Physical therapy and nutrition evaluation. -check TSH , CPK and a.m. Cortisol. -Orthostasis in a.m.  Active Problems:  Hyperkalemia Hold Lotensin. Follow up with repeat labs. Monitor on telemetry. Check CPK    Acute kidney injury Likely secondary to dehydration. Monitor with IV fluids. Avoid NSAIDs  malnutrition Nutrition consult. Daughter reported that he may have lost weight over past few months. Follow up as outpt.   Nausea and vomiting Possibly related to GERD. Continue out in antiemetics and add PPI. LFTs and lipase normal    Diabetes mellitus, insulin dependent (IDDM), uncontrolled -Frequent falls at home with  diaphoresis concerning for hypoglycemia however his A1c has been persistently high and aspirin his PCP note is concern for noncompliance. -Check A1c. Blood glucose elevated in the ED. Continue with home dose Lantus and monitor on sliding scale insulin. -Monitor for hypoglycemic symptoms    HYPERTENSION Blood pressure stable. Hold Lotensin given hypokalemia. Check orthostasis     Anemia Appears to be chronic and stable.       Diet:diabetic  DVT prophylaxis: sq heparin   Code Status: full code Family Communication: discussed with daughter on the phone Disposition Plan: admit under observation  [place] Pager [phone]  Total time spent on admission :55 minutes  If 7PM-7AM, please contact night-coverage [email or web address] Password  [facility] 12/11/2013, 9:17 PM

## 2013-12-11 NOTE — ED Provider Notes (Addendum)
CSN: [id number]     Arrival date & time 12/11/13  1832 History   First MD Initiated Contact with Patient 12/11/13 1847     Chief Complaint  Patient presents with  . Abdominal Pain      HPI  Pt presents with his sister, [place].  Has been weak over the last several days. He did not want to eye doctor appointment this morning. Sister states that he's been working outside out of the heat and humidity for the last several days. Also these been spending a few hours at a time in a car with no air conditioning. She was concerned he may "be heat exhausted". He states he's been drinking well. His blood sugars are well controlled in the morning. Typically around 80 or 90. He takes Lantus in the evening without change recently. The last week his blood sugars over 200 in the afternoon. He takes Glucotrol daily as well.  Per family if his not being stimulated he "just sleeps" this is new over the last 2-3 days.  His only complaint is that he feels generalized weakness. No localized weakness or stroke symptoms. No chest pain or dyspnea. Describes a "sick feeling" in the stomach but no frank pain no vomiting. Patient feels like his food sticks in his lower esophagus before going into the stomach but not refluxing or vomiting. Normal bowel movements. Normal urine output without polyuria.  Past Medical History  Diagnosis Date  . Hypertension   . Diabetes mellitus   . Hyperlipemia    Past Surgical History  Procedure Laterality Date  . Appendectomy    . Cystoscopy/retrograde/ureteroscopy/stone extraction with basket    . Colonoscopy  09/06/2011    Procedure: COLONOSCOPY;  Surgeon: Rogene Houston, MD;  [facility];  Service: Endoscopy;  Laterality: N/A;  23   Family History  Problem Relation Age of Onset  . Diabetes Mother   . Diabetes Father   . Heart disease Father   . Diabetes Brother   . Diabetes Brother   . Heart disease Brother    History  Substance Use Topics  .  Smoking status: Never Smoker   . Smokeless tobacco: Not on file  . Alcohol Use: No    Review of Systems  Constitutional: Positive for fatigue. Negative for fever, chills, diaphoresis and appetite change.  HENT: Negative for mouth sores, sore throat and trouble swallowing.   Eyes: Negative for visual disturbance.  Respiratory: Negative for cough, chest tightness, shortness of breath and wheezing.   Cardiovascular: Negative for chest pain.  Gastrointestinal: Negative for nausea, vomiting, abdominal pain, diarrhea and abdominal distention.  Endocrine: Negative for polydipsia, polyphagia and polyuria.  Genitourinary: Negative for dysuria, frequency and hematuria.  Musculoskeletal: Negative for gait problem.  Skin: Negative for color change, pallor and rash.  Neurological: Positive for weakness. Negative for dizziness, syncope, light-headedness and headaches.  Hematological: Does not bruise/bleed easily.  Psychiatric/Behavioral: Negative for behavioral problems and confusion.      Allergies  Review of patient's allergies indicates no known allergies.  Home Medications   Prior to Admission medications   Medication Sig Start Date End Date Taking? Authorizing Provider  aspirin 81 MG EC tablet Take 81 mg by mouth daily.     Yes Historical Provider, MD  benazepril (LOTENSIN) 10 MG tablet Take 10 mg by mouth daily.   Yes Historical Provider, MD  glipiZIDE (GLUCOTROL) 10 MG tablet Take 10 mg by mouth 2 (two) times daily.   Yes Historical  Provider, MD  Insulin Glargine (LANTUS SOLOSTAR) 100 UNIT/ML Solostar Pen Inject 20 Units into the skin daily at 10 pm. 11/03/13  Yes Fayrene Helper, MD  latanoprost (XALATAN) 0.005 % ophthalmic solution Place 1 drop into both eyes at bedtime.   Yes Historical Provider, MD  pravastatin (PRAVACHOL) 10 MG tablet Take 10 mg by mouth at bedtime.   Yes Historical Provider, MD  tobramycin-dexamethasone [facility]) ophthalmic solution Place 1 drop into the right  eye 4 (four) times daily.   Yes Historical Provider, MD   BP 115/82  Pulse 88  Temp(Src) 99.1 F (37.3 C) (Oral)  Resp 14  Ht 5\' 5"  (1.651 m)  Wt 136 lb (61.689 kg)  BMI 22.63 kg/m2  SpO2 98% Physical Exam  Constitutional: He is oriented to person, place, and time. He appears well-developed and well-nourished. No distress.  HENT:  Head: Normocephalic.  Eyes: Conjunctivae are normal. Pupils are equal, round, and reactive to light. No scleral icterus.  Neck: Normal range of motion. Neck supple. No thyromegaly present.  Cardiovascular: Normal rate and regular rhythm.  Exam reveals no gallop and no friction rub.   No murmur heard. Pulmonary/Chest: Effort normal and breath sounds normal. No respiratory distress. He has no wheezes. He has no rales.  Abdominal: Soft. Bowel sounds are normal. He exhibits no distension. There is no tenderness. There is no rebound.    Musculoskeletal: Normal range of motion.  Neurological: He is alert and oriented to person, place, and time.  Skin: Skin is warm and dry. No rash noted.  Psychiatric: He has a normal mood and affect. His behavior is normal.    ED Course  Procedures (including critical care time) Labs Review Labs Reviewed  CBC WITH DIFFERENTIAL - Abnormal; Notable for the following:    RBC 3.52 (*)    Hemoglobin 11.1 (*)    HCT 32.5 (*)    Platelets 136 (*)    All other components within normal limits  BASIC METABOLIC PANEL - Abnormal; Notable for the following:    Sodium 134 (*)    Potassium 6.0 (*)    Glucose, Bld 482 (*)    BUN 59 (*)    Creatinine, Ser 1.55 (*)    GFR calc non Af Amer 42 (*)    GFR calc Af Amer 49 (*)    All other components within normal limits  URINALYSIS, ROUTINE W REFLEX MICROSCOPIC - Abnormal; Notable for the following:    Glucose, UA >1000 (*)    Hgb urine dipstick TRACE (*)    All other components within normal limits  HEPATIC FUNCTION PANEL - Abnormal; Notable for the following:    Albumin 3.4 (*)     Total Bilirubin 0.2 (*)    All other components within normal limits  CBG MONITORING, ED - Abnormal; Notable for the following:    Glucose-Capillary 468 (*)    All other components within normal limits  LIPASE, BLOOD  TROPONIN I  URINE MICROSCOPIC-ADD ON  BASIC METABOLIC PANEL  CBG MONITORING, ED    Imaging Review No results found.   EKG Interpretation None      EKG:  NSR, No peaked T waves.  Normal QT. No ectopy.NO ST changes. MDM   Final diagnoses:  Acute kidney injury  Hyperkalemia  Dehydration    Non specific complaints.  Generalized weakness.  Not depressed.  Hyperglycemic in afternoon for several days.  Working outside in the heat/humidity, but states he is hydrating. Normal exam.  Plan is hydration,  lab evaluation.   20:38:  Labs show elevated BUN and Cr. (59 and 1.55).  Baseline creatinine between 1.2 and 1.3. K+elevated at 6. No EKG changes.  He was given 1 L fluid. Was given 6 units of insulin. CBG, and basic metabolic profile being repeated. I have placed a call to the hospitalist regarding admission regarding rehydration, repeat labs.    Tanna Furry, MD 12/11/13 2033  Tanna Furry, MD 12/11/13 2123

## 2013-12-11 NOTE — ED Notes (Signed)
Attempted to call report to Lattie Haw, RN, stated she would call back for report.

## 2013-12-12 DIAGNOSIS — N183 Chronic kidney disease, stage 3 unspecified: Secondary | ICD-10-CM | POA: Diagnosis present

## 2013-12-12 DIAGNOSIS — E860 Dehydration: Secondary | ICD-10-CM | POA: Diagnosis present

## 2013-12-12 DIAGNOSIS — D696 Thrombocytopenia, unspecified: Secondary | ICD-10-CM | POA: Diagnosis present

## 2013-12-12 DIAGNOSIS — R5381 Other malaise: Secondary | ICD-10-CM | POA: Diagnosis present

## 2013-12-12 DIAGNOSIS — R5383 Other fatigue: Secondary | ICD-10-CM | POA: Diagnosis present

## 2013-12-12 DIAGNOSIS — D649 Anemia, unspecified: Secondary | ICD-10-CM

## 2013-12-12 DIAGNOSIS — Z8249 Family history of ischemic heart disease and other diseases of the circulatory system: Secondary | ICD-10-CM | POA: Diagnosis not present

## 2013-12-12 DIAGNOSIS — E46 Unspecified protein-calorie malnutrition: Secondary | ICD-10-CM | POA: Diagnosis present

## 2013-12-12 DIAGNOSIS — K219 Gastro-esophageal reflux disease without esophagitis: Secondary | ICD-10-CM | POA: Diagnosis present

## 2013-12-12 DIAGNOSIS — Z794 Long term (current) use of insulin: Secondary | ICD-10-CM | POA: Diagnosis not present

## 2013-12-12 DIAGNOSIS — Z9181 History of falling: Secondary | ICD-10-CM | POA: Diagnosis not present

## 2013-12-12 DIAGNOSIS — I129 Hypertensive chronic kidney disease with stage 1 through stage 4 chronic kidney disease, or unspecified chronic kidney disease: Secondary | ICD-10-CM | POA: Diagnosis present

## 2013-12-12 DIAGNOSIS — IMO0002 Reserved for concepts with insufficient information to code with codable children: Secondary | ICD-10-CM | POA: Diagnosis not present

## 2013-12-12 DIAGNOSIS — Z833 Family history of diabetes mellitus: Secondary | ICD-10-CM | POA: Diagnosis not present

## 2013-12-12 DIAGNOSIS — E875 Hyperkalemia: Secondary | ICD-10-CM | POA: Diagnosis present

## 2013-12-12 DIAGNOSIS — N179 Acute kidney failure, unspecified: Secondary | ICD-10-CM | POA: Diagnosis present

## 2013-12-12 DIAGNOSIS — IMO0001 Reserved for inherently not codable concepts without codable children: Secondary | ICD-10-CM | POA: Diagnosis present

## 2013-12-12 DIAGNOSIS — N390 Urinary tract infection, site not specified: Secondary | ICD-10-CM | POA: Diagnosis present

## 2013-12-12 DIAGNOSIS — I951 Orthostatic hypotension: Secondary | ICD-10-CM | POA: Diagnosis present

## 2013-12-12 DIAGNOSIS — E785 Hyperlipidemia, unspecified: Secondary | ICD-10-CM | POA: Diagnosis present

## 2013-12-12 LAB — GLUCOSE, CAPILLARY
GLUCOSE-CAPILLARY: 156 mg/dL — AB (ref 70–99)
GLUCOSE-CAPILLARY: 140 mg/dL — AB (ref 70–99)
Glucose-Capillary: 206 mg/dL — ABNORMAL HIGH (ref 70–99)
Glucose-Capillary: 163 mg/dL — ABNORMAL HIGH (ref 70–99)

## 2013-12-12 LAB — CBC
HCT: 25 % — ABNORMAL LOW (ref 39.0–52.0)
Hemoglobin: 8.4 g/dL — ABNORMAL LOW (ref 13.0–17.0)
MCH: 31 pg (ref 26.0–34.0)
MCHC: 33.6 g/dL (ref 30.0–36.0)
MCV: 92.3 fL (ref 78.0–100.0)
PLATELETS: 133 10*3/uL — AB (ref 150–400)
RBC: 2.71 MIL/uL — ABNORMAL LOW (ref 4.22–5.81)
RDW: 14.2 % (ref 11.5–15.5)
WBC: 7.8 10*3/uL (ref 4.0–10.5)

## 2013-12-12 LAB — BASIC METABOLIC PANEL
ANION GAP: 8 (ref 5–15)
BUN: 47 mg/dL — AB (ref 6–23)
CO2: 26 mEq/L (ref 19–32)
Calcium: 8.3 mg/dL — ABNORMAL LOW (ref 8.4–10.5)
Chloride: 107 mEq/L (ref 96–112)
Creatinine, Ser: 1.46 mg/dL — ABNORMAL HIGH (ref 0.50–1.35)
GFR calc Af Amer: 53 mL/min — ABNORMAL LOW (ref >90)
GFR, EST NON AFRICAN AMERICAN: 46 mL/min — AB (ref >90)
Glucose, Bld: 229 mg/dL — ABNORMAL HIGH (ref 70–99)
Potassium: 4.4 mEq/L (ref 3.7–5.3)
Sodium: 141 mEq/L (ref 137–147)

## 2013-12-12 LAB — HEMOGLOBIN A1C
HEMOGLOBIN A1C: 9.2 % — AB (ref <5.7)
Mean Plasma Glucose: 217 mg/dL — ABNORMAL HIGH (ref <117)

## 2013-12-12 LAB — CORTISOL: Cortisol, Plasma: 13.6 ug/dL

## 2013-12-12 LAB — TSH: TSH: 0.921 u[IU]/mL (ref 0.350–4.500)

## 2013-12-12 MED ORDER — GLUCERNA SHAKE PO LIQD
237.0000 mL | Freq: Two times a day (BID) | ORAL | Status: DC
  Administered 2013-12-12 – 2013-12-13 (×3): 237 mL via ORAL

## 2013-12-12 MED ORDER — LATANOPROST 0.005 % OP SOLN
OPHTHALMIC | Status: AC
  Filled 2013-12-12: qty 2.5

## 2013-12-12 MED ORDER — INSULIN GLARGINE 100 UNIT/ML ~~LOC~~ SOLN
25.0000 [IU] | Freq: Every day | SUBCUTANEOUS | Status: DC
  Administered 2013-12-12: 25 [IU] via SUBCUTANEOUS
  Filled 2013-12-12 (×3): qty 0.25

## 2013-12-12 NOTE — Evaluation (Signed)
Physical Therapy Evaluation Patient Details Name: Jacob Barnes MRN: [id number] DOB: Sep 09, 1940 Today's Date: 12/12/2013   History of Present Illness  73 year old male with history of hypertension, insulin-dependent diabetes mellitus, hyperlipidemia who was brought to the ED by his family after he was found to be sleepy all day since yesterday. This morning his daughter found him slump on the floor and appeared diaphoretic and very weak. He also appeared lethargic. As per his sister at bedside who took  him to his ophthalmology appointment yesterday, she had noticed him to be sleepy all day yesterday as well as today and participating very little in conversation . As per daughter, he has been out in the yard during the day cutting grass past 2 days and also driving his car without air conditioning. She also reports that his appetite has been poor. Patient had similar fall 3 weeks back and was found to be sweaty. Daughter is not aware about his blood glucose levels. Patient reports that they usually run around 100-120 but was in 200s today. In the ED patient had an episode of nausea with vomiting of clear liquids. He also complained of some epigastric discomfort.  Clinical Impression  Pt is a 73 year old male who presents to PT with dx of generalized weakness.  MMT performed without deficits noted in strength of LE.  During evaluation, pt was (I) with bed mobility skills, mod (I) with transfers, and mod (I)/supervision with gait of 500 without AD.  No LOB or significant deviations noted during gait assessment.  Educated pt on probability of lower activity tolerance when he returns home, and how to slowly increase back to normal functioning.  Pt is currently at baseline level of function, and will be d/c from acute PT services.  No follow up PT recommended.     Follow Up Recommendations No PT follow up    Equipment Recommendations  None recommended by PT       Precautions / Restrictions  Precautions Precautions: None Restrictions Weight Bearing Restrictions: No      Mobility  Bed Mobility Overal bed mobility: Independent                Transfers Overall transfer level: Modified independent                  Ambulation/Gait Ambulation/Gait assistance: Modified independent (Device/Increase time);Supervision Ambulation Distance (Feet): 500 Feet Assistive device: None Gait Pattern/deviations: Step-through pattern   Gait velocity interpretation: Below normal speed for age/gender       Balance Overall balance assessment: No apparent balance deficits (not formally assessed)                                           Pertinent Vitals/Pain Pain Assessment: No/denies pain    Home Living Family/patient expects to be discharged to:: Private residence Living Arrangements: Children (Lives with daughter who works during the day) Available Help at Discharge: Family;Available PRN/intermittently Type of Home: House Home Access: Stairs to enter Entrance Stairs-Rails: None Entrance Stairs-Number of Steps: 1 Home Layout: Laundry or work area in basement (Basement steps with rail on [employer]) Home Equipment: Grab bars - tub/shower 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  Pt reports he still  drives.         Extremity/Trunk Assessment               Lower Extremity Assessment: Overall WFL for tasks assessed (MMT hip 4+/5, knee/ankle 5/5)         Communication   Communication: No difficulties  Cognition Arousal/Alertness: Awake/alert Behavior During Therapy: WFL for tasks assessed/performed Overall Cognitive Status: Within Functional Limits for tasks assessed                        Assessment/Plan    PT Assessment Patent does not need any further PT  services  PT Diagnosis     PT Problem List    PT Treatment Interventions     PT Goals (Current goals can be found in the Care Plan section) Acute Rehab PT Goals PT Goal Formulation: No goals set, d/c therapy     End of Session Equipment Utilized During Treatment: Gait belt Activity Tolerance: Patient tolerated treatment well Patient left: in chair;with call bell/phone within reach;with chair alarm set           Time: 5427-0623 PT Time Calculation (min): 20 min   Charges:   PT Evaluation $Initial PT Evaluation Tier I: 1 Procedure          Jacob Barnes 12/12/2013, 9:17 AM

## 2013-12-12 NOTE — Progress Notes (Signed)
UR Completed.  Jacob Barnes Jane [phone] 12/12/2013  

## 2013-12-12 NOTE — Care Management Note (Signed)
    Page 1 of 1   12/12/2013     11:31:33 AM CARE MANAGEMENT NOTE 12/12/2013  Patient:  Jacob Barnes, Jacob Barnes   Account Number:  [id number]  Date Initiated:  12/12/2013  Documentation initiated by:  Jolene Provost  Subjective/Objective Assessment:   Pt is from home with self care, lives with daughter. Pt has no HH services, DME's or medication needs.     Action/Plan:   Pt plans to discharge home with self care. No CM needs at this time.   Anticipated DC Date:  12/13/2013   Anticipated DC Plan:  [place]  CM consult      Choice offered to / List presented to:             Status of service:  Completed, signed off Medicare Important Message given?   (If response is "NO", the following Medicare IM given date fields will be blank) Date Medicare IM given:   Medicare IM given by:   Date Additional Medicare IM given:   Additional Medicare IM given by:    Discharge Disposition:  HOME/SELF CARE  Per UR Regulation:    If discussed at Long Length of Stay Meetings, dates discussed:    Comments:  12/12/2013 [place], RN, MSN, [facility]

## 2013-12-12 NOTE — Progress Notes (Signed)
Inpatient Diabetes Program Recommendations  AACE/ADA: New Consensus Statement on Inpatient Glycemic Control (2013)  Target Ranges:  Prepandial:   less than 140 mg/dL      Peak postprandial:   less than 180 mg/dL (1-2 hours)      Critically ill patients:  140 - 180 mg/dL   Results for MATTIA, LIFORD (MRN [id number]) as of 12/12/2013 07:27  Ref. Range 12/11/2013 18:37 12/11/2013 20:39 12/11/2013 23:48  Glucose-Capillary Latest Range: 70-99 mg/dL 468 (H) 402 (H) 228 (H)   Diabetes history: DM2 Outpatient Diabetes medications: Glipizide 10 mg BID, Lantus 20 units QHS Current orders for Inpatient glycemic control: Lantus 20 units QHS, Novolog 0-15 units AC, Novolog 0-5 units HS  Inpatient Diabetes Program Recommendations Insulin - Basal: Please increase Lantus to 25 units QHS. If Lantus is increased as requested, please also consider ordering a one time dose of Lantus 5 units for now (pt got Lantus 20 units at 00:10).  Thanks, Barnie Alderman, RN, MSN, CCRN Diabetes Coordinator Inpatient Diabetes Program [phone] (Team Pager) [phone] ([facility] office) [phone] [facility] office)

## 2013-12-12 NOTE — Progress Notes (Signed)
INITIAL NUTRITION ASSESSMENT  DOCUMENTATION CODES Per approved criteria  -Not Applicable   INTERVENTION:  CHO Modified diet/ please assist with tray set-up due to pt vision impairment and he doesn't have his glasses   Glucerna Shake po BID, each supplement provides 220 kcal and 10 grams of protein   Provided brisf Diabetes diet education review   NUTRITION DIAGNOSIS: Inadequate oral intake related to generalized  weakness as evidenced by 25% meal intake, and acute weight loss of 3% in 1 week.  Goal: Pt to meet >/= 90% of their estimated nutrition needs    Monitor:  Po intake, labs and wt trends   Reason for Assessment: evaluate nutrition status and requirements   73 y.o. male  Admitting Dx: Weakness generalized  ASSESSMENT: pt is very pleasant 73 yo male who reports Diabetes diagnosis since 1979. He denies weight loss and reports usual body weight around 135#. Re-weight obtained this morning shows decrease of 4#,3% in 1 week which is significant. He says his sisters have been concerned that he's not eating enough. Today pt denies changes in his appetite. He reports 3 meals daily and snack q hs most days. He eats regularly at fast food resturants  for lunch and sometimes dinner. He also chooses regular soft drinks at times. Reviewed basics of limiting/avoiding consistent intake of sugar containing beverages and reinforced the importance of regular meal intake. Recommended to pt begin drinking carbohydrate controlled nutrition shake daily to increase daily  Nutrition and he is agreeable.   Diet hx: Breakfast- oatmeal and toast with diet soda or water Lunch- sandwich (deli or [employer] single hamburger) Dinner-Chicken, baked potato  Snack-peanut butter crackers Primary beverages-water, diet and regular soft drinks   Nutrition Focused Physical Exam:  Subcutaneous Fat:  Orbital Region: WDL Upper Arm Region: WDL Thoracic and Lumbar Region: WDL  Muscle:  Temple Region:  WDL Clavicle Bone Region: mild wasting Clavicle and Acromion Bone Region: mild wasting Scapular Bone Region: mild wasting Dorsal Hand: WDL Patellar Region: WDL Anterior Thigh Region: WDL Posterior Calf Region: WDL  Edema: none    Height: Ht Readings from Last 1 Encounters:  12/11/13 5\' 5"  (1.651 m)    Weight: Wt Readings from Last 1 Encounters:  12/11/13 129 lb 3 oz (58.6 kg)    Ideal Body Weight: 136# (61.8 kg)  Wt Readings from Last 10 Encounters:  12/11/13 129 lb 3 oz (58.6 kg)  12/01/13 136 lb 1.9 oz (61.744 kg)  11/03/13 136 lb (61.689 kg)  07/16/13 138 lb 6.4 oz (62.778 kg)  03/17/13 134 lb 0.6 oz (60.8 kg)  10/29/12 134 lb 6.4 oz (60.963 kg)  08/31/12 138 lb (62.596 kg)  06/27/12 138 lb (62.596 kg)  05/01/12 136 lb 0.6 oz (61.707 kg)  01/05/12 130 lb 12.8 oz (59.33 kg)    Usual Body Weight: 135-138#  % Usual Body Weight: 97%  BMI:  Body mass index is 21.5 kg/(m^2). normal range  Estimated Nutritional Needs: Kcal: 1860-2170 (to prevent further weight loss) Protein: 74-81 gr Fluid: 1900 ml daily  Skin: intact  Diet Order: Carb Control  EDUCATION NEEDS: -Education needs addressed noted above   Intake/Output Summary (Last 24 hours) at 12/12/13 0806 Last data filed at 12/12/13 0600  Gross per 24 hour  Intake    615 ml  Output      0 ml  Net    615 ml    Last BM: PTA  Labs:   Recent Labs Lab 12/11/13 1948 12/11/13 2031 12/12/13 2778  NA 134* 136* 141  K 6.0* 5.3 4.4  CL 100 103 107  CO2 23 21 26   BUN 59* 57* 47*  CREATININE 1.55* 1.48* 1.46*  CALCIUM 9.1 8.6 8.3*  GLUCOSE 482* 406* 229*    CBG (last 3)   Recent Labs  12/11/13 2039 12/11/13 2348 12/12/13 0740  GLUCAP 402* 228* 156*    Scheduled Meds: . aspirin EC  81 mg Oral Daily  . heparin  5,000 Units Subcutaneous 3 times per day  . insulin aspart  0-15 Units Subcutaneous TID WC  . insulin aspart  0-5 Units Subcutaneous QHS  . insulin glargine  20 Units Subcutaneous  QHS  . latanoprost  1 drop Both Eyes QHS  . pantoprazole  40 mg Oral Daily  . simvastatin  5 mg Oral q1800  . sodium chloride  3 mL Intravenous Q12H  . tobramycin-dexamethasone  1 drop Right Eye 4 times per day    Continuous Infusions: . sodium chloride 100 mL/hr at 12/11/13 2351    Past Medical History  Diagnosis Date  . Hypertension   . Diabetes mellitus   . Hyperlipemia     Past Surgical History  Procedure Laterality Date  . Appendectomy    . Cystoscopy/retrograde/ureteroscopy/stone extraction with basket    . Colonoscopy  09/06/2011    Procedure: COLONOSCOPY;  Surgeon: Rogene Houston, MD;  Location: [facility];  Service: Endoscopy;  Laterality: N/A;  [place] MS,RD,CSG,LDN Office: [phone] Pager: [phone]

## 2013-12-12 NOTE — Progress Notes (Signed)
PROGRESS NOTE  TIRAN SAUSEDA PJK:[id number] DOB: 1940-06-12 DOA: 12/11/2013 PCP: [name], MD  Summary: 73 year old history of diabetes who is brought to the emergency department for lethargy, generalized weakness. Noted to be working outside in the heat. In the emergency department found to have acute renal failure, hyperkalemia as well as orthostatic and significantly hyperglycemic.  Assessment/Plan: 1. Acute renal failure, resolving, likely secondary to dehydration from heat complicated by hyperglycemia and possible benazepril.. 2. Hyperkalemia. Resolved, secondary to acute renal failure. benezapril. 3. Orthostatic hypotension. Resolved. 4. Generalized weakness. Resolved.  5. DM type 2 on insulin, compliance questioned. Somewhat improved today. 6. Anemia , significance unclear, suspect dilution. No evidence of bleeding, recheck CBC in AM. 7. HTN. Stable.   Overall improved. Continue IVF, recheck BMP in AM.  Increase Lantus to 25 units QHS  Check CBC in AM  Likely home 9/5  Discussed with sister and brother at bedside  Code Status: full code DVT prophylaxis: heparin Family Communication:  Disposition Plan:   Murray Hodgkins, MD  [facility]  Pager [phone] If 7PM-7AM, please contact night-coverage at [email or web address], password [facility] 12/12/2013, 2:35 PM  LOS: 1 day   Consultants:  PT: no follow-up needed.  Procedures:    Antibiotics:    HPI/Subjective: Feels much better today. No complaints.   Objective: Filed Vitals:   12/11/13 2351 12/12/13 0535 12/12/13 1026 12/12/13 1428  BP: 95/66 107/65  102/62  Pulse: 91 78 80 91  Temp: 98.5 F (36.9 C) 98 F (36.7 C) 98.2 F (36.8 C) 98.7 F (37.1 C)  TempSrc: Oral Oral Oral Oral  Resp: 18 18 18 18   Height:      Weight: 58.6 kg (129 lb 3 oz) 59.875 kg (132 lb)    SpO2: 100% 100% 98% 100%    Intake/Output Summary (Last 24 hours) at 12/12/13 1435 Last data filed at 12/12/13 1300  Gross per 24 hour    Intake    855 ml  Output      0 ml  Net    855 ml     Filed Weights   12/11/13 1841 12/11/13 2351 12/12/13 0535  Weight: 61.689 kg (136 lb) 58.6 kg (129 lb 3 oz) 59.875 kg (132 lb)    Exam:     Afebrile, VSS Gen. Appears calm and comfortable, sitting in chair  Psych. Alert, speech fluent and appropriate  Cardiovascular. RRR no m/r/g. No LE edema.  Respiratory. CTA bilaterally, no w/r/r. Normal resp effort.  Musculoskeletal. Lifts both legs without difficulty.  Data Reviewed:  Chemistry: CBG improved from 400s >>200s. BUN trending down, Creatine modestly improved. Cortisol WNL  Heme: Hgb dropped from 11.1 >> 8.4. Plts stable at 133.  Scheduled Meds: . aspirin EC  81 mg Oral Daily  . feeding supplement (GLUCERNA SHAKE)  237 mL Oral BID BM  . heparin  5,000 Units Subcutaneous 3 times per day  . insulin aspart  0-15 Units Subcutaneous TID WC  . insulin aspart  0-5 Units Subcutaneous QHS  . insulin glargine  20 Units Subcutaneous QHS  . latanoprost  1 drop Both Eyes QHS  . pantoprazole  40 mg Oral Daily  . simvastatin  5 mg Oral q1800  . sodium chloride  3 mL Intravenous Q12H  . tobramycin-dexamethasone  1 drop Right Eye 4 times per day   Continuous Infusions: . sodium chloride 100 mL/hr at 12/12/13 8250    Principal Problem:   Acute kidney injury Active Problems:   Diabetes mellitus, insulin dependent (IDDM),  uncontrolled   HYPERTENSION   GERD (gastroesophageal reflux disease)   Hyperkalemia   Weakness generalized   Dehydration   Anemia   Protein-calorie malnutrition   Orthostatic hypotension   Time spent 20 minutes

## 2013-12-13 LAB — BASIC METABOLIC PANEL
ANION GAP: 8 (ref 5–15)
BUN: 28 mg/dL — ABNORMAL HIGH (ref 6–23)
CALCIUM: 8 mg/dL — AB (ref 8.4–10.5)
CO2: 27 meq/L (ref 19–32)
CREATININE: 1.4 mg/dL — AB (ref 0.50–1.35)
Chloride: 104 mEq/L (ref 96–112)
GFR calc Af Amer: 56 mL/min — ABNORMAL LOW (ref >90)
GFR calc non Af Amer: 48 mL/min — ABNORMAL LOW (ref >90)
Glucose, Bld: 64 mg/dL — ABNORMAL LOW (ref 70–99)
Potassium: 4.3 mEq/L (ref 3.7–5.3)
Sodium: 139 mEq/L (ref 137–147)

## 2013-12-13 LAB — CBC
HEMATOCRIT: 23.3 % — AB (ref 39.0–52.0)
HEMOGLOBIN: 8 g/dL — AB (ref 13.0–17.0)
MCH: 32 pg (ref 26.0–34.0)
MCHC: 34.3 g/dL (ref 30.0–36.0)
MCV: 93.2 fL (ref 78.0–100.0)
Platelets: 121 10*3/uL — ABNORMAL LOW (ref 150–400)
RBC: 2.5 MIL/uL — ABNORMAL LOW (ref 4.22–5.81)
RDW: 14.2 % (ref 11.5–15.5)
WBC: 4.7 10*3/uL (ref 4.0–10.5)

## 2013-12-13 LAB — GLUCOSE, CAPILLARY
GLUCOSE-CAPILLARY: 76 mg/dL (ref 70–99)
GLUCOSE-CAPILLARY: 183 mg/dL — AB (ref 70–99)

## 2013-12-13 NOTE — Progress Notes (Signed)
PROGRESS NOTE  Jacob Barnes:[id number] DOB: [birth year] DOA: 12/11/2013 PCP: [name], MD  Summary: 73 year old history of diabetes who is brought to the emergency department for lethargy, generalized weakness. Noted to be working outside in the heat. In the emergency department found to have acute renal failure, hyperkalemia as well as orthostatic and significantly hyperglycemic.  Assessment/Plan: 1. Acute renal failure, nearly resolved, likely secondary to dehydration from heat complicated by hyperglycemia and possible benazepril.. 2. Hyperkalemia. Resolved, secondary to acute renal failure. benezapril. 3. Orthostatic hypotension. Resolved with IV fluids. 4. Generalized weakness. Resolved with IV fluids. 5. DM type 2 on insulin, compliance questioned. Overall stable with one mild episode of hypoglycemia this morning. 6. Anemia , significance unclear, suspect dilution. No evidence of bleeding, recheck CBC in AM. 7. HTN. Stable.   Overall much improved. No complaints. Plan discharge home today.  Long discussion with patient, son, daughter-in-law, grandson at bedside. Many questions regarding to the proper diet for diabetes, acceptable snacks, general management of diabetes. Patient would benefit from extensive outpatient education which is already being pursued by Dr. Moshe Cipro. We discussed eliminating sugary drinks, concentrated sweets, checking blood sugars.  Anemia, normocytic, likely dilutional. Clearly count low since at least July of this year. No history of bleeding. Suspect anemia of chronic disease. He reports having had a colonoscopy in the last several years. Would consider further evaluation as an outpatient and monitoring.  I discuss anemia, possible causes and treatment recommendations with the family and patient present. He has had no history of bleeding and I think further evaluation can be conducted in the outpatient setting. The drop in his hemoglobin here is likely  dilutional.  Code Status: full code DVT prophylaxis: heparin Family Communication:  Disposition Plan:   Murray Hodgkins, MD  [facility]  Pager [phone] If 7PM-7AM, please contact night-coverage at [email or web address], password [facility] 12/13/2013, 11:06 AM  LOS: 2 days   Consultants:  PT: no follow-up needed.  Procedures:    Antibiotics:    HPI/Subjective: Feels very well. No complaints. Ambulating without difficulty. Tolerating diet. Wants to go home. No history of bleeding.  Objective: Filed Vitals:   12/12/13 1026 12/12/13 1428 12/12/13 2211 12/13/13 0500  BP:  102/62 109/63 110/60  Pulse: 80 91 90 69  Temp: 98.2 F (36.8 C) 98.7 F (37.1 C) 98.2 F (36.8 C) 97.9 F (36.6 C)  TempSrc: Oral Oral Oral Oral  Resp: 18 18 18 16   Height:      Weight:      SpO2: 98% 100% 100% 100%    Intake/Output Summary (Last 24 hours) at 12/13/13 1106 Last data filed at 12/13/13 0943  Gross per 24 hour  Intake   3000 ml  Output    600 ml  Net   2400 ml     Filed Weights   12/11/13 1841 12/11/13 2351 12/12/13 0535  Weight: 61.689 kg (136 lb) 58.6 kg (129 lb 3 oz) 59.875 kg (132 lb)    Exam:     Afebrile, vital signs are stable. No hypoxia.  Gen. Appears calm, comfortable.  Psychiatric. Grossly normal mood, affect. Speech fluent and appropriate.  Respiratory clear to auscultation bilaterally. No wheezes, rales or rhonchi. Normal respiratory effort.  Cardiovascular regular rate and rhythm. No murmur, rub or gallop. No lower extremity edema.  Data Reviewed:  Multiple voids  One episode of hypoglycemia, blood sugars overall much better controlled. His BUN continues to trend downwards as does creatinine. Creatinine likely near baseline, component of chronic kidney  disease suspected. Thrombocytopenia, mild, stable.  Hemoglobin A1c 9.2. TSH normal.  Scheduled Meds: . aspirin EC  81 mg Oral Daily  . feeding supplement (GLUCERNA SHAKE)  237 mL Oral BID BM  . heparin   5,000 Units Subcutaneous 3 times per day  . insulin aspart  0-15 Units Subcutaneous TID WC  . insulin aspart  0-5 Units Subcutaneous QHS  . insulin glargine  25 Units Subcutaneous QHS  . latanoprost  1 drop Both Eyes QHS  . pantoprazole  40 mg Oral Daily  . simvastatin  5 mg Oral q1800  . sodium chloride  3 mL Intravenous Q12H  . tobramycin-dexamethasone  1 drop Right Eye 4 times per day   Continuous Infusions: . sodium chloride 100 mL/hr at 12/12/13 2128    Principal Problem:   Acute kidney injury Active Problems:   Diabetes mellitus, insulin dependent (IDDM), uncontrolled   HYPERTENSION   GERD (gastroesophageal reflux disease)   Hyperkalemia   Weakness generalized   Dehydration   Anemia   Protein-calorie malnutrition   Orthostatic hypotension

## 2013-12-13 NOTE — Discharge Summary (Signed)
Physician Discharge Summary  Jacob Barnes FFM:[id number] DOB: 1940-11-14 DOA: 12/11/2013  PCP: Jacob Nakayama, MD  Admit date: 12/11/2013 Discharge date: 12/13/2013  Recommendations for Outpatient Follow-up:  1. Acute renal failure superimposed on chronic kidney disease, see below. Consider repeat basic metabolic panel. Consider resuming ACE inhibitor. 2. Hyperglycemia, diabetes mellitus with suspected noncompliance and knowledge deficit. Continued education in the outpatient setting likely be of benefit. Patient encouraged to keep his already scheduled outpatient diabetes education appointment. 3. Normocytic anemia and thrombocytopenia of unclear significance. Consider repeat CBC on followup. Consider further evaluation as clinically indicated.    Follow-up Information   Follow up with Jacob Nakayama, MD. Schedule an appointment as soon as possible for a visit in 1 week.   Specialty:  Family Medicine   Contact information:   [address] [place] [address] [phone]      Discharge Diagnoses:  1. Acute renal failure 2. Hyperkalemia. 3. Orthostatic hypotension. 4. Generalized weakness. 5. Diabetes mellitus type 2, uncontrolled by hemoglobin A1c. 6. Normocytic anemia. 7. Suspected chronic kidney disease stage III  Discharge Condition: Improved Disposition: Home  Diet recommendation: Diabetic diet  Filed Weights   12/11/13 1841 12/11/13 2351 12/12/13 0535  Weight: 61.689 kg (136 lb) 58.6 kg (129 lb 3 oz) 59.875 kg (132 lb)    History of present illness:  73 year old man with diabetes brought to the emergency department for lethargy and generalized weakness. Admitted for acute renal failure, hyperkalemia, orthostatic hypotension and significant hyperglycemia.  Hospital Course:  Jacob Barnes was treated with diarrhea fluids with rapid resolution of renal failure and hyperkalemia. Orthostasis also resolved. He was evaluated by physical therapy and has no  outpatient needs. Blood sugars much better controlled in the hospital setting. History highly suggestive of unintentional noncompliance with diet and questionable adherence to medication regimen. Substantial knowledge deficit in regard to diabetes. Now stable for discharge. Other issues as below.  1. Acute renal failure, nearly resolved, likely secondary to dehydration from heat complicated by hyperglycemia and possible benazepril.. 2. Hyperkalemia. Resolved, secondary to acute renal failure. benezapril. 3. Orthostatic hypotension. Resolved with IV fluids. 4. Generalized weakness. Resolved with IV fluids. 5. DM type 2 on insulin, compliance questioned. Overall stable with one mild episode of hypoglycemia this morning. 6. Anemia , significance unclear, suspect dilution. No evidence of bleeding, recheck CBC in AM. 7. HTN. Stable. Long discussion with patient, son, daughter-in-law, grandson at bedside. Many questions regarding to the proper diet for diabetes, acceptable snacks, general management of diabetes. Patient would benefit from extensive outpatient education which is already being pursued by Jacob Barnes. We discussed eliminating sugary drinks, concentrated sweets, checking blood sugars.  Anemia, normocytic, likely dilutional. Clearly count low since at least July of this year. No history of bleeding. Suspect anemia of chronic disease. He reports having had a colonoscopy in the last several years. Would consider further evaluation as an outpatient and monitoring.  I discussed anemia, possible causes and treatment recommendations with the family and patient present. He has had no history of bleeding and I think further evaluation can be conducted in the outpatient setting. The drop in his hemoglobin here is likely dilutional.  Consultants:  PT: no follow-up needed. Procedures: none  Discharge Instructions  Discharge Instructions   Activity as tolerated - No restrictions    Complete by:  As  directed      Diet Carb Modified    Complete by:  As directed      Discharge instructions  Complete by:  As directed   Call your physician or seek immediate medical attention for blood sugars over 400, less than 70. Seek medical treatment for passing out or worsening of condition. Be sure to check your blood sugars at least twice per day and keep a log in a notebook and bring to all appointments. Avoid sugary drinks and concentrated sweets.          Current Discharge Medication List    CONTINUE these medications which have NOT CHANGED   Details  aspirin 81 MG EC tablet Take 81 mg by mouth daily.      glipiZIDE (GLUCOTROL) 10 MG tablet Take 10 mg by mouth 2 (two) times daily.    Insulin Glargine (LANTUS SOLOSTAR) 100 UNIT/ML Solostar Pen Inject 20 Units into the skin daily at 10 pm. Qty: 5 pen, Refills: PRN   Associated Diagnoses: Diabetes mellitus, insulin dependent (IDDM), uncontrolled    latanoprost (XALATAN) 0.005 % ophthalmic solution Place 1 drop into both eyes at bedtime.    pravastatin (PRAVACHOL) 10 MG tablet Take 10 mg by mouth at bedtime.    tobramycin-dexamethasone (TOBRADEX) ophthalmic solution Place 1 drop into the right eye 4 (four) times daily.      STOP taking these medications     benazepril (LOTENSIN) 10 MG tablet        No Known Allergies  The results of significant diagnostics from this hospitalization (including imaging, microbiology, ancillary and laboratory) are listed below for reference.    Significant Diagnostic Studies: Dg Chest Port 1 View  12/11/2013   CLINICAL DATA:  Upper abdominal pain, generalized weakness. Hypertension.  EXAM: PORTABLE CHEST - 1 VIEW  COMPARISON:  08/23/2006 ribs series  FINDINGS: Heart size and mediastinal contours within normal range. Lungs are predominantly clear. No pleural effusion or pneumothorax. There may be mild right shoulder degenerative change. No acute osseous finding.  IMPRESSION: No radiographic evidence of  active cardiopulmonary disease.   Electronically Signed   By: Jacob Barnes M.D.   On: 12/11/2013 22:20    Labs: Basic Metabolic Panel:  Recent Labs Lab 12/11/13 1948 12/11/13 2031 12/12/13 0511 12/13/13 0607  NA 134* 136* 141 139  K 6.0* 5.3 4.4 4.3  CL 100 103 107 104  CO2 23 21 26 27   GLUCOSE 482* 406* 229* 64*  BUN 59* 57* 47* 28*  CREATININE 1.55* 1.48* 1.46* 1.40*  CALCIUM 9.1 8.6 8.3* 8.0*   Liver Function Tests:  Recent Labs Lab 12/11/13 1948  AST 19  ALT 12  ALKPHOS 94  BILITOT 0.2*  PROT 7.8  ALBUMIN 3.4*    Recent Labs Lab 12/11/13 1948  LIPASE 57   CBC:  Recent Labs Lab 12/11/13 1948 12/12/13 0511 12/13/13 0607  WBC 8.0 7.8 4.7  NEUTROABS 5.6  --   --   HGB 11.1* 8.4* 8.0*  HCT 32.5* 25.0* 23.3*  MCV 92.3 92.3 93.2  PLT 136* 133* 121*   Cardiac Enzymes:  Recent Labs Lab 12/11/13 1948 12/11/13 2104  CKTOTAL  --  114  TROPONINI <0.30  --    CBG:  Recent Labs Lab 12/12/13 1118 12/12/13 1648 12/12/13 2057 12/13/13 0710 12/13/13 1120  GLUCAP 206* 140* 163* 76 183*    Principal Problem:   Acute kidney injury Active Problems:   Diabetes mellitus, insulin dependent (IDDM), uncontrolled   HYPERTENSION   GERD (gastroesophageal reflux disease)   Hyperkalemia   Weakness generalized   Dehydration   Anemia   Protein-calorie malnutrition   Orthostatic hypotension  Time coordinating discharge: 50 minutes greater than 50% in counseling  Signed:  Murray Hodgkins, MD [facility] 12/13/2013, 1:07 PM

## 2013-12-15 DIAGNOSIS — Z23 Encounter for immunization: Secondary | ICD-10-CM | POA: Insufficient documentation

## 2013-12-15 DIAGNOSIS — N183 Chronic kidney disease, stage 3 unspecified: Secondary | ICD-10-CM | POA: Insufficient documentation

## 2013-12-15 DIAGNOSIS — Z111 Encounter for screening for respiratory tuberculosis: Secondary | ICD-10-CM | POA: Insufficient documentation

## 2013-12-15 NOTE — Assessment & Plan Note (Signed)
Controlled, no change in medication Hyperlipidemia:Low fat diet discussed and encouraged.  \ 

## 2013-12-15 NOTE — Assessment & Plan Note (Signed)
Importance of blood sugar control is stresed

## 2013-12-15 NOTE — Progress Notes (Signed)
   Subjective:    Patient ID: Jacob Barnes, male    DOB: 03-20-1941, 73 y.o.   MRN: [id number]  HPI The PT is here for follow up and re-evaluation of chronic medical conditions, medication management and review of any available recent lab and radiology data.  Preventive health is updated, specifically  Cancer screening and Immunization.   . The PT denies any adverse reactions to current medications since the last visit.  There are no new concerns.  Blood sugars remain elevated and his diet erratic. He is here with his sister who promises to be a backbone to help him with this, they will attend diabetic class offered in September, still resist endo involvement    Review of Systems See HPI Denies recent fever or chills.c/o generalized fatigue Denies sinus pressure, nasal congestion, ear pain or sore throat. Denies chest congestion, productive cough or wheezing. Denies chest pains, palpitations and leg swelling Denies abdominal pain, nausea, vomiting,diarrhea or constipation.   Denies dysuria, frequency, hesitancy or incontinence. Denies joint pain, swelling and limitation in mobility. Denies headaches, seizures, numbness, or tingling. Denies depression, anxiety or insomnia. Denies skin break down or rash.        Objective:   Physical Exam  BP 132/78  Pulse 80  Resp 16  Ht 5' 5.25" (1.657 m)  Wt 136 lb 1.9 oz (61.744 kg)  BMI 22.49 kg/m2  SpO2 100%  Patient alert and oriented and in no cardiopulmonary distress.  HEENT: No facial asymmetry, EOMI,   oropharynx pink and moist.  Neck supple no JVD, no mass.  Chest: Clear to auscultation bilaterally.  CVS: S1, S2 no murmurs, no S3.Regular rate.  ABD: Soft non tender.   Ext: No edema  MS: Adequate ROM spine, shoulders, hips and knees.  Skin: Intact, no ulcerations or rash noted.  Psych: Good eye contact, normal affect. Memory intact not anxious or depressed appearing.  CNS: CN 2-12 intact, power,  normal throughout.no  focal deficits noted.      Assessment & Plan:  Diabetes mellitus, insulin dependent (IDDM), uncontrolled Uncontrolled  Patient advised to reduce carb and sweets, commit to regular physical activity, take meds as prescribed, test blood as directed, and attempt to lose weight, to improve blood sugar control. Medication adjustments made and pt to attend diabetic class as well as test blood sugars and call in for help His siter is at the visit and states she will support him through this, I have advised more thn once that he may be better served with endo , but they remain resistant  Hyperlipidemia Controlled, no change in medication Hyperlipidemia:Low fat diet discussed and encouraged.    HYPERTENSION Controlled, no change in medication   Need for vaccination with 13-polyvalent pneumococcal conjugate vaccine Vaccine administered at visit.   CKD (chronic kidney disease) stage 3, GFR 30-59 ml/min Importance of blood sugar control is stresed

## 2013-12-15 NOTE — Assessment & Plan Note (Signed)
Stressed the importance of controlling his blood sugars to improve this

## 2013-12-15 NOTE — Assessment & Plan Note (Signed)
Vaccine administered at visit.  

## 2013-12-15 NOTE — Assessment & Plan Note (Addendum)
Vaccine was administered during previous visit

## 2013-12-15 NOTE — Assessment & Plan Note (Signed)
Uncontrolled  Patient advised to reduce carb and sweets, commit to regular physical activity, take meds as prescribed, test blood as directed, and attempt to lose weight, to improve blood sugar control. Medication adjustments made and pt to attend diabetic class as well as test blood sugars and call in for help His siter is at the visit and states she will support him through this, I have advised more thn once that he may be better served with endo , but they remain resistant

## 2013-12-15 NOTE — Progress Notes (Addendum)
   Subjective:    Patient ID: Jacob Barnes, male    DOB: 1941/01/13, 73 y.o.   MRN: [id number]  HPI    Review of Systems     Objective:   Physical Exam        Assessment & Plan:  Note that prevnar was administered in July visit , not during this visit  Diabetes mellitus, insulin dependent (IDDM), uncontrolled Uncontrolled  Patient advised to reduce carb and sweets, commit to regular physical activity, take meds as prescribed, test blood as directed, and attempt to lose weight, to improve blood sugar control. Medication adjustments made and pt to attend diabetic class as well as test blood sugars and call in for help His siter is at the visit and states she will support him through this, I have advised more thn once that he may be better served with endo , but they remain resistant  Hyperlipidemia Controlled, no change in medication Hyperlipidemia:Low fat diet discussed and encouraged.    HYPERTENSION Controlled, no change in medication   Need for vaccination with 13-polyvalent pneumococcal conjugate vaccine Vaccine was administered during previous visit  CKD (chronic kidney disease) stage 3, GFR 30-59 ml/min Importance of blood sugar control is stresed

## 2013-12-15 NOTE — Assessment & Plan Note (Signed)
Test placed, pt to return in 48 to 72 hours for  This to be read

## 2013-12-15 NOTE — Addendum Note (Signed)
Addended by: Fayrene Helper on: 12/15/2013 08:14 PM   Modules accepted: Level of Service

## 2013-12-15 NOTE — Assessment & Plan Note (Signed)
Controlled, no change in medication  

## 2013-12-17 ENCOUNTER — Ambulatory Visit: Payer: PRIVATE HEALTH INSURANCE | Admitting: Family Medicine

## 2014-01-07 ENCOUNTER — Ambulatory Visit: Payer: PRIVATE HEALTH INSURANCE | Admitting: Family Medicine

## 2014-01-08 ENCOUNTER — Ambulatory Visit (INDEPENDENT_AMBULATORY_CARE_PROVIDER_SITE_OTHER): Payer: Medicare Other | Admitting: Family Medicine

## 2014-01-08 ENCOUNTER — Encounter: Payer: Self-pay | Admitting: Family Medicine

## 2014-01-08 VITALS — BP 130/70 | HR 79 | Resp 16 | Ht 65.25 in | Wt 139.1 lb

## 2014-01-08 DIAGNOSIS — I10 Essential (primary) hypertension: Secondary | ICD-10-CM

## 2014-01-08 DIAGNOSIS — K219 Gastro-esophageal reflux disease without esophagitis: Secondary | ICD-10-CM

## 2014-01-08 DIAGNOSIS — Z9119 Patient's noncompliance with other medical treatment and regimen: Secondary | ICD-10-CM

## 2014-01-08 DIAGNOSIS — D649 Anemia, unspecified: Secondary | ICD-10-CM

## 2014-01-08 DIAGNOSIS — A048 Other specified bacterial intestinal infections: Secondary | ICD-10-CM

## 2014-01-08 DIAGNOSIS — D538 Other specified nutritional anemias: Secondary | ICD-10-CM

## 2014-01-08 DIAGNOSIS — N183 Chronic kidney disease, stage 3 unspecified: Secondary | ICD-10-CM

## 2014-01-08 DIAGNOSIS — E785 Hyperlipidemia, unspecified: Secondary | ICD-10-CM

## 2014-01-08 DIAGNOSIS — D509 Iron deficiency anemia, unspecified: Secondary | ICD-10-CM

## 2014-01-08 DIAGNOSIS — Z794 Long term (current) use of insulin: Secondary | ICD-10-CM

## 2014-01-08 DIAGNOSIS — N289 Disorder of kidney and ureter, unspecified: Secondary | ICD-10-CM

## 2014-01-08 DIAGNOSIS — R1310 Dysphagia, unspecified: Secondary | ICD-10-CM

## 2014-01-08 DIAGNOSIS — Z91199 Patient's noncompliance with other medical treatment and regimen due to unspecified reason: Secondary | ICD-10-CM

## 2014-01-08 DIAGNOSIS — E1065 Type 1 diabetes mellitus with hyperglycemia: Secondary | ICD-10-CM

## 2014-01-08 DIAGNOSIS — R1013 Epigastric pain: Secondary | ICD-10-CM

## 2014-01-08 DIAGNOSIS — D61818 Other pancytopenia: Secondary | ICD-10-CM

## 2014-01-08 DIAGNOSIS — B9681 Helicobacter pylori [H. pylori] as the cause of diseases classified elsewhere: Secondary | ICD-10-CM

## 2014-01-08 DIAGNOSIS — E1165 Type 2 diabetes mellitus with hyperglycemia: Secondary | ICD-10-CM

## 2014-01-08 DIAGNOSIS — IMO0001 Reserved for inherently not codable concepts without codable children: Secondary | ICD-10-CM

## 2014-01-08 MED ORDER — PANTOPRAZOLE SODIUM 20 MG PO TBEC: 20.0000 mg | DELAYED_RELEASE_TABLET | Freq: Every day | ORAL | Status: DC

## 2014-01-08 NOTE — Patient Instructions (Signed)
F/u 2nd week in Dec, call if you ned me before  You NEED to write down blood sugars that you check every day twice so that you can actually call in for help if blood sugars are too high, before you come for office visit  You start new med for reflux and heartburn and you are referrred to [facility] regarding anemia, difficulty swallowing , poor  Appetite  Labs today, cmp and eGFR, H pylori, CBC and anemia panel  Non fast lab  Before December visit , chem 7 and EGFr and hBA1C

## 2014-01-09 LAB — CBC WITH DIFFERENTIAL/PLATELET
Basophils Absolute: 0 10*3/uL (ref 0.0–0.1)
Basophils Relative: 0 % (ref 0–1)
Eosinophils Absolute: 0 10*3/uL (ref 0.0–0.7)
Eosinophils Relative: 1 % (ref 0–5)
HCT: 29.6 % — ABNORMAL LOW (ref 39.0–52.0)
Hemoglobin: 9.5 g/dL — ABNORMAL LOW (ref 13.0–17.0)
Lymphocytes Relative: 46 % (ref 12–46)
Lymphs Abs: 1.8 10*3/uL (ref 0.7–4.0)
MCH: 30.8 pg (ref 26.0–34.0)
MCHC: 32.1 g/dL (ref 30.0–36.0)
MCV: 96.1 fL (ref 78.0–100.0)
Monocytes Absolute: 1.2 10*3/uL — ABNORMAL HIGH (ref 0.1–1.0)
Monocytes Relative: 32 % — ABNORMAL HIGH (ref 3–12)
Neutro Abs: 0.8 10*3/uL — ABNORMAL LOW (ref 1.7–7.7)
Neutrophils Relative %: 21 % — ABNORMAL LOW (ref 43–77)
Platelets: 145 10*3/uL — ABNORMAL LOW (ref 150–400)
RBC: 3.08 MIL/uL — ABNORMAL LOW (ref 4.22–5.81)
RDW: 14.8 % (ref 11.5–15.5)
WBC: 3.9 10*3/uL — ABNORMAL LOW (ref 4.0–10.5)

## 2014-01-09 LAB — IRON AND TIBC
%SAT: 8 % — AB (ref 20–55)
Iron: 28 ug/dL — ABNORMAL LOW (ref 42–165)
TIBC: 346 ug/dL (ref 215–435)
UIBC: 318 ug/dL (ref 125–400)

## 2014-01-09 LAB — COMPLETE METABOLIC PANEL WITH GFR
ALT: 19 U/L (ref 0–53)
AST: 25 U/L (ref 0–37)
Albumin: 3.7 g/dL (ref 3.5–5.2)
Alkaline Phosphatase: 88 U/L (ref 39–117)
BUN: 27 mg/dL — ABNORMAL HIGH (ref 6–23)
CO2: 27 mEq/L (ref 19–32)
CREATININE: 1.46 mg/dL — AB (ref 0.50–1.35)
Calcium: 8.9 mg/dL (ref 8.4–10.5)
Chloride: 104 mEq/L (ref 96–112)
GFR, Est African American: 54 mL/min — ABNORMAL LOW
GFR, Est Non African American: 47 mL/min — ABNORMAL LOW
Glucose, Bld: 297 mg/dL — ABNORMAL HIGH (ref 70–99)
Potassium: 4.6 mEq/L (ref 3.5–5.3)
SODIUM: 136 meq/L (ref 135–145)
TOTAL PROTEIN: 7 g/dL (ref 6.0–8.3)
Total Bilirubin: 0.2 mg/dL (ref 0.2–1.2)

## 2014-01-09 LAB — FERRITIN: FERRITIN: 13 ng/mL — AB (ref 22–322)

## 2014-01-09 LAB — FOLATE: Folate: >20 ng/mL

## 2014-01-09 LAB — PATHOLOGIST SMEAR REVIEW

## 2014-01-09 LAB — VITAMIN B12: Vitamin B-12: 983 pg/mL — ABNORMAL HIGH (ref 211–911)

## 2014-01-09 LAB — H. PYLORI ANTIBODY, IGG: H Pylori IgG: 1.61 {ISR} — ABNORMAL HIGH

## 2014-01-10 DIAGNOSIS — Z9119 Patient's noncompliance with other medical treatment and regimen: Secondary | ICD-10-CM | POA: Insufficient documentation

## 2014-01-10 DIAGNOSIS — D61818 Other pancytopenia: Secondary | ICD-10-CM | POA: Insufficient documentation

## 2014-01-10 DIAGNOSIS — D509 Iron deficiency anemia, unspecified: Secondary | ICD-10-CM

## 2014-01-10 DIAGNOSIS — Z91199 Patient's noncompliance with other medical treatment and regimen due to unspecified reason: Secondary | ICD-10-CM | POA: Insufficient documentation

## 2014-01-10 HISTORY — DX: Iron deficiency anemia, unspecified: D50.9

## 2014-01-10 HISTORY — DX: Other pancytopenia: D61.818

## 2014-01-10 NOTE — Assessment & Plan Note (Signed)
Awaiting pathology review, pt may need hematology eval, currently has at least 2 more immediate referrals to deal with and actually needs endo mx of his BG but still refusing Will hold on henme eval until path report available

## 2014-01-10 NOTE — Assessment & Plan Note (Signed)
Pt to be referred to nephrology for eval and assistance with mx, he is now pancytopenic also iron deficient. Recommend dietary iron and is also referred to GI

## 2014-01-10 NOTE — Assessment & Plan Note (Signed)
Treated in past with no "significant" re spike in titer, will not recommend re treatment at this time, will defer to GI recommendations

## 2014-01-10 NOTE — Assessment & Plan Note (Signed)
Controlled, no change in medication  

## 2014-01-10 NOTE — Progress Notes (Signed)
Subjective:    Patient ID: Jacob Barnes, male    DOB: [birth year], 73 y.o.   MRN: [id number]  HPI Pt in for hospital f/u when he presented dehydrated , anemic and with hyperglycemia. Since then states he is "much better". Has spent some time out of town with his son, has attended diabetic ed in the office with the sister who is assuming responsibility for him, unfortunately , however, there is absolutely no recording of blood sugars being checked, either in his meter, or on the papers they were given at the last visit.. I again explain that if his blood sugar has not improved when next checked , he really will be given little to no choice to be treated by endo, which both he and his sister have repeatedly refused, stating that  They "will do better" C/o significant difficulty swallowing , with poor appetite, wants to see specialist about this, and referral is entered and necessary   Review of Systems See HPI Denies recent fever or chills. Denies sinus pressure, nasal congestion, ear pain or sore throat. Denies chest congestion, productive cough or wheezing. Denies chest pains, palpitations and leg swelling .   Denies dysuria, frequency, hesitancy or incontinence. Denies joint pain, swelling and limitation in mobility. Denies headaches, seizures, numbness, or tingling. Denies depression, anxiety or insomnia. C/o lesion on lower thigh where he was bitten by a tick weeks ago       Objective:   Physical Exam BP 130/70  Pulse 79  Resp 16  Ht 5' 5.25" (1.657 m)  Wt 139 lb 1.9 oz (63.104 kg)  BMI 22.98 kg/m2  SpO2 99% Patient alert and oriented and in no cardiopulmonary distress.  HEENT: No facial asymmetry, EOMI,   oropharynx pink and moist.  Neck supple no JVD, no mass.  Chest: Clear to auscultation bilaterally.  CVS: S1, S2 no murmurs, no S3.Regular rate.  ABD: Soft non tender.   Ext: No edema  MS: Adequate ROM spine, shoulders, hips and knees.  Skin: Intact, no  ulcerations or rash noted.nodule on left inner lower thigh in area of tick bite, hyperpigmented, non tender, no drainage, advised and gave pt topical antibiotic for twice daily use for 5 days, no evidence of retained tick or bacterial superinfection  Psych: Good eye contact, normal affect. Memory impaired  not anxious or depressed appearing.  CNS: CN 2-12 intact, power,  normal throughout.no focal deficits noted.        Assessment & Plan:  CKD (chronic kidney disease) stage 3, GFR 30-59 ml/min Pt to be referred to nephrology for eval and assistance with mx, he is now pancytopenic also iron deficient. Recommend dietary iron and is also referred to GI  Anemia Daily iron supplement of 325 mg to be recommended, also referred for GI eval  Other pancytopenia Awaiting pathology review, pt may need hematology eval, currently has at least 2 more immediate referrals to deal with and actually needs endo mx of his BG but still refusing Will hold on henme eval until path report available  Essential hypertension Controlled, no change in medication   Medical non-compliance Despite repeated requests to bring in diabetic log none present , and no readings to be seen/ heard on his monitor. Sister, who promised to help is present , though initially stated he was "doingmuch better" is unable to add any insight to "missing log" and no record of blood sugars except to "apologize" Absolutely anticipate endo referral by next lab draw, pt understanding and med  compliance along with the sister who is to be responsible still unacceptable  Dysphagia Worsened with uncontrolled GERD , poor appetite and recent malnutrition , start pPI and GI eval  H. pylori infection Treated in past with no "significant" re spike in titer, will not recommend re treatment at this time, will defer to GI recommendations

## 2014-01-10 NOTE — Assessment & Plan Note (Signed)
Despite repeated requests to bring in diabetic log none present , and no readings to be seen/ heard on his monitor. Sister, who promised to help is present , though initially stated he was "doingmuch better" is unable to add any insight to "missing log" and no record of blood sugars except to "apologize" Absolutely anticipate endo referral by next lab draw, pt understanding and med compliance along with the sister who is to be responsible still unacceptable

## 2014-01-10 NOTE — Assessment & Plan Note (Signed)
Worsened with uncontrolled GERD , poor appetite and recent malnutrition , start pPI and GI eval

## 2014-01-10 NOTE — Assessment & Plan Note (Signed)
Daily iron supplement of 325 mg to be recommended, also referred for GI eval

## 2014-01-13 NOTE — Addendum Note (Signed)
Addended by: Eual Fines on: 01/13/2014 10:15 AM   Modules accepted: Orders

## 2014-01-14 ENCOUNTER — Ambulatory Visit (INDEPENDENT_AMBULATORY_CARE_PROVIDER_SITE_OTHER): Payer: PRIVATE HEALTH INSURANCE | Admitting: Internal Medicine

## 2014-01-14 ENCOUNTER — Encounter (INDEPENDENT_AMBULATORY_CARE_PROVIDER_SITE_OTHER): Payer: Self-pay | Admitting: *Deleted

## 2014-01-14 ENCOUNTER — Other Ambulatory Visit (INDEPENDENT_AMBULATORY_CARE_PROVIDER_SITE_OTHER): Payer: Self-pay | Admitting: *Deleted

## 2014-01-14 ENCOUNTER — Encounter (INDEPENDENT_AMBULATORY_CARE_PROVIDER_SITE_OTHER): Payer: Self-pay | Admitting: Internal Medicine

## 2014-01-14 VITALS — BP 100/42 | HR 68 | Temp 97.9°F | Ht 65.0 in | Wt 139.7 lb

## 2014-01-14 DIAGNOSIS — R1310 Dysphagia, unspecified: Secondary | ICD-10-CM

## 2014-01-14 DIAGNOSIS — K219 Gastro-esophageal reflux disease without esophagitis: Secondary | ICD-10-CM

## 2014-01-14 DIAGNOSIS — D509 Iron deficiency anemia, unspecified: Secondary | ICD-10-CM

## 2014-01-14 DIAGNOSIS — R1314 Dysphagia, pharyngoesophageal phase: Secondary | ICD-10-CM

## 2014-01-14 MED ORDER — PANTOPRAZOLE SODIUM 40 MG PO TBEC: 40.0000 mg | DELAYED_RELEASE_TABLET | Freq: Every day | ORAL | Status: DC

## 2014-01-14 NOTE — Progress Notes (Signed)
Subjective:    Patient ID: Jacob Barnes, male    DOB: 1941/04/05, 73 y.o.   MRN: [id number]  HPI Referred to our office for indigestion.  He tells me hamburgers.He avoid spicy foods. Recently started on Protonix which he says his acid reflux is better. Sometimes it feels like foods are slow to go down and sometimes it is painful. There has been no weight loss. Appetite is okay. He has a BM about once a day. No melena or BRRB. BUN and creatinine have been elevated for the past several months and Dr Moshe Cipro is following. No family hx of colon cancer.. Diabetic since 1979. Sister states that in December if kidney function has not improved, will refer to Kidney specialist.   Recent admission to [facility] in September for hyperglycemia and dehydration. He had been out mowing yards Patient has refused to see an endocrinologist in the past.  BMET    Component Value Date/Time   NA 136 01/08/2014 1156   K 4.6 01/08/2014 1156   CL 104 01/08/2014 1156   CO2 27 01/08/2014 1156   GLUCOSE 297* 01/08/2014 1156   BUN 27* 01/08/2014 1156   CREATININE 1.46* 01/08/2014 1156   CREATININE 1.40* 12/13/2013 0607   CALCIUM 8.9 01/08/2014 1156   GFRNONAA 47* 01/08/2014 1156   GFRNONAA 48* 12/13/2013 0607   GFRAA 54* 01/08/2014 1156   GFRAA 56* 12/13/2013 0607     CBC    Component Value Date/Time   WBC 3.9* 01/08/2014 1156   RBC 3.08* 01/08/2014 1156   HGB 9.5* 01/08/2014 1156   HCT 29.6* 01/08/2014 1156   PLT 145* 01/08/2014 1156   MCV 96.1 01/08/2014 1156   MCH 30.8 01/08/2014 1156   MCHC 32.1 01/08/2014 1156   RDW 14.8 01/08/2014 1156   LYMPHSABS 1.8 01/08/2014 1156   MONOABS 1.2* 01/08/2014 1156   EOSABS 0.0 01/08/2014 1156   BASOSABS 0.0 01/08/2014 1156   CBC Latest Ref Rng 01/08/2014 12/13/2013 12/12/2013  WBC 4.0 - 10.5 K/uL 3.9(L) 4.7 7.8  Hemoglobin 13.0 - 17.0 g/dL 9.5(L) 8.0(L) 8.4(L)  Hematocrit 39.0 - 52.0 % 29.6(L) 23.3(L) 25.0(L)  Platelets 150 - 400 K/uL 145(L) 121(L) 133(L)   Two years ago his hemoglobin was  normal.       01/08/2014 Iron 28, %Sat 8, folate greater than 20, Vitamin B12 982 Ferritin 13   09/06/2011 Screening average risk colonoscopy Dr. Laural Golden: Impression:  Examination performed to cecum.  Two small polyps ablated via cold biopsy from transverse colon and submitted together.  Small anal papilla 2 small polyps were removed via cold biopsy and one is an adenoma. Results given to patient's wife. Next colonoscopy in 10 years    Review of Systems Past Medical History  Diagnosis Date  . Hypertension   . Diabetes mellitus     says since 1979  . Hyperlipemia     Past Surgical History  Procedure Laterality Date  . Appendectomy    . Cystoscopy/retrograde/ureteroscopy/stone extraction with basket    . Colonoscopy  09/06/2011    Procedure: COLONOSCOPY;  Surgeon: Rogene Houston, MD;  Location: [facility];  Service: Endoscopy;  Laterality: N/A;  830    No Known Allergies  Current Outpatient Prescriptions on File Prior to Visit  Medication Sig Dispense Refill  . aspirin 81 MG EC tablet Take 81 mg by mouth daily.        . benazepril (LOTENSIN) 10 MG tablet Take 10 mg by mouth daily.      Marland Kitchen  glipiZIDE (GLUCOTROL) 10 MG tablet Take 10 mg by mouth 2 (two) times daily.      . Insulin Glargine (LANTUS SOLOSTAR) 100 UNIT/ML Solostar Pen Inject 20 Units into the skin daily at 10 pm.  5 pen  PRN  . latanoprost (XALATAN) 0.005 % ophthalmic solution Place 1 drop into both eyes at bedtime.      . pantoprazole (PROTONIX) 20 MG tablet Take 1 tablet (20 mg total) by mouth daily.  30 tablet  3  . pravastatin (PRAVACHOL) 10 MG tablet Take 10 mg by mouth at bedtime.      Marland Kitchen tobramycin-dexamethasone (TOBRADEX) ophthalmic solution Place 1 drop into the right eye 4 (four) times daily.       No current facility-administered medications on file prior to visit.        Objective:   Physical Exam Filed Vitals:   01/14/14 1402  BP: 100/42  Pulse: 68  Temp: 97.9 F (36.6 C)  Height: 5'  5" (1.651 m)  Weight: 139 lb 11.2 oz (63.368 kg)   Alert and oriented. Skin warm and dry. Oral mucosa is moist.   . Sclera anicteric, conjunctivae is pink. Thyroid not enlarged. No cervical lymphadenopathy. Lungs clear. Heart regular rate and rhythm.  Abdomen is soft. Bowel sounds are positive. No hepatomegaly. No abdominal masses felt. No tenderness.  No edema to lower extremities. Stool brown and guaiac negative       Assessment & Plan:  GERD/Dysphagia. PUD needs to be ruled. Recent + H. Pylori. Hx of same.  I discussed with Dr. Lucianne Muss Anemia. Stool guaiac negative. Ferritin is low as well as iron. I think patient needs an EGD/ED.

## 2014-01-14 NOTE — Patient Instructions (Signed)
Protonix 40mg  daily. Will hold treating H. Pylori for now. Needs an EGD/ED. The risks and benefits such as perforation, bleeding, and infection were reviewed with the patient and is agreeable.

## 2014-01-15 ENCOUNTER — Encounter (HOSPITAL_COMMUNITY)
Admission: RE | Disposition: A | Discharge status: Home / Self Care | Payer: Self-pay | Source: Ambulatory Visit | Attending: Internal Medicine

## 2014-01-15 ENCOUNTER — Telehealth (INDEPENDENT_AMBULATORY_CARE_PROVIDER_SITE_OTHER): Payer: Self-pay | Admitting: *Deleted

## 2014-01-15 ENCOUNTER — Encounter (HOSPITAL_COMMUNITY): Payer: Self-pay | Admitting: *Deleted

## 2014-01-15 ENCOUNTER — Ambulatory Visit (HOSPITAL_COMMUNITY)
Admission: RE | Admit: 2014-01-15 | Discharge: 2014-01-15 | Disposition: A | Discharge status: Home / Self Care | Payer: Medicare Other | Source: Ambulatory Visit | Attending: Internal Medicine | Admitting: Internal Medicine

## 2014-01-15 DIAGNOSIS — R1310 Dysphagia, unspecified: Secondary | ICD-10-CM | POA: Diagnosis present

## 2014-01-15 DIAGNOSIS — Z79899 Other long term (current) drug therapy: Secondary | ICD-10-CM | POA: Diagnosis not present

## 2014-01-15 DIAGNOSIS — K2210 Ulcer of esophagus without bleeding: Secondary | ICD-10-CM | POA: Insufficient documentation

## 2014-01-15 DIAGNOSIS — I10 Essential (primary) hypertension: Secondary | ICD-10-CM | POA: Diagnosis not present

## 2014-01-15 DIAGNOSIS — K222 Esophageal obstruction: Secondary | ICD-10-CM | POA: Insufficient documentation

## 2014-01-15 DIAGNOSIS — K253 Acute gastric ulcer without hemorrhage or perforation: Secondary | ICD-10-CM

## 2014-01-15 DIAGNOSIS — K2950 Unspecified chronic gastritis without bleeding: Secondary | ICD-10-CM | POA: Insufficient documentation

## 2014-01-15 DIAGNOSIS — E119 Type 2 diabetes mellitus without complications: Secondary | ICD-10-CM | POA: Insufficient documentation

## 2014-01-15 DIAGNOSIS — K259 Gastric ulcer, unspecified as acute or chronic, without hemorrhage or perforation: Secondary | ICD-10-CM | POA: Insufficient documentation

## 2014-01-15 DIAGNOSIS — E785 Hyperlipidemia, unspecified: Secondary | ICD-10-CM | POA: Diagnosis not present

## 2014-01-15 DIAGNOSIS — Z794 Long term (current) use of insulin: Secondary | ICD-10-CM | POA: Diagnosis not present

## 2014-01-15 DIAGNOSIS — K2960 Other gastritis without bleeding: Secondary | ICD-10-CM

## 2014-01-15 DIAGNOSIS — K219 Gastro-esophageal reflux disease without esophagitis: Secondary | ICD-10-CM

## 2014-01-15 DIAGNOSIS — D509 Iron deficiency anemia, unspecified: Secondary | ICD-10-CM | POA: Diagnosis not present

## 2014-01-15 HISTORY — PX: ESOPHAGOGASTRODUODENOSCOPY: SHX5428

## 2014-01-15 HISTORY — PX: MALONEY DILATION: SHX5535

## 2014-01-15 SURGERY — EGD (ESOPHAGOGASTRODUODENOSCOPY)
Anesthesia: Moderate Sedation

## 2014-01-15 MED ORDER — SODIUM CHLORIDE 0.9 % IV SOLN
INTRAVENOUS | Status: DC
  Administered 2014-01-15: 14:00:00 via INTRAVENOUS

## 2014-01-15 MED ORDER — MEPERIDINE HCL 50 MG/ML IJ SOLN
INTRAMUSCULAR | Status: DC | PRN
  Administered 2014-01-15: 25 mg via INTRAVENOUS

## 2014-01-15 MED ORDER — PANTOPRAZOLE SODIUM 40 MG PO TBEC: 40.0000 mg | DELAYED_RELEASE_TABLET | Freq: Two times a day (BID) | ORAL | Status: DC

## 2014-01-15 MED ORDER — STERILE WATER FOR IRRIGATION IR SOLN
Status: DC | PRN
  Administered 2014-01-15: 15:00:00

## 2014-01-15 MED ORDER — MEPERIDINE HCL 50 MG/ML IJ SOLN
INTRAMUSCULAR | Status: AC
  Filled 2014-01-15: qty 1

## 2014-01-15 MED ORDER — MIDAZOLAM HCL 5 MG/5ML IJ SOLN
INTRAMUSCULAR | Status: DC | PRN
  Administered 2014-01-15: 2 mg via INTRAVENOUS
  Administered 2014-01-15: 1 mg via INTRAVENOUS

## 2014-01-15 MED ORDER — BUTAMBEN-TETRACAINE-BENZOCAINE 2-2-14 % EX AERO
INHALATION_SPRAY | CUTANEOUS | Status: DC | PRN
  Administered 2014-01-15: 2 via TOPICAL

## 2014-01-15 MED ORDER — MIDAZOLAM HCL 5 MG/5ML IJ SOLN
INTRAMUSCULAR | Status: AC
  Filled 2014-01-15: qty 10

## 2014-01-15 NOTE — Telephone Encounter (Signed)
Do not give insulin or po diabetic med this am. They will check BS in endo. Blood sugar in the 80s this am.

## 2014-01-15 NOTE — Discharge Instructions (Signed)
No aspirin or NSAIDs. Pantoprazole 40 mg by mouth by mouth before rectus and evening meal daily. Resume taking iron pill(ferrous sulfate) 325 mg by mouth twice daily. No driving for 24 hours. Patient will call with biopsy results. CBC in 4 weeks. Office visit in 8 weeks.   Gastrointestinal Endoscopy, Care After Refer to this sheet in the next few weeks. These instructions provide you with information on caring for yourself after your procedure. Your caregiver may also give you more specific instructions. Your treatment has been planned according to current medical practices, but problems sometimes occur. Call your caregiver if you have any problems or questions after your procedure. HOME CARE INSTRUCTIONS  If you were given medicine to help you relax (sedative), do not drive, operate machinery, or sign important documents for 24 hours.  Avoid alcohol and hot or warm beverages for the first 24 hours after the procedure.  Only take over-the-counter or prescription medicines for pain, discomfort, or fever as directed by your caregiver. You may resume taking your normal medicines unless your caregiver tells you otherwise. Ask your caregiver when you may resume taking medicines that may cause bleeding, such as aspirin, clopidogrel, or warfarin.  You may return to your normal diet and activities on the day after your procedure, or as directed by your caregiver. Walking may help to reduce any bloated feeling in your abdomen.  Drink enough fluids to keep your urine clear or pale yellow.  You may gargle with salt water if you have a sore throat. SEEK IMMEDIATE MEDICAL CARE IF:  You have severe nausea or vomiting.  You have severe abdominal pain, abdominal cramps that last longer than 6 hours, or abdominal swelling (distention).  You have severe shoulder or back pain.  You have trouble swallowing.  You have shortness of breath, your breathing is shallow, or you are breathing faster than  normal.  You have a fever or a rapid heartbeat.  You vomit blood or material that looks like coffee grounds.  You have bloody, black, or tarry stools. MAKE SURE YOU:  Understand these instructions.  Will watch your condition.  Will get help right away if you are not doing well or get worse. Document Released: 11/09/2003 Document Revised: 08/11/2013 Document Reviewed: 06/27/2011 [facility] Patient Information 2015 [place], [place]. This information is not intended to replace advice given to you by your health care provider. Make sure you discuss any questions you have with your health care provider.

## 2014-01-15 NOTE — H&P (Signed)
Jacob Barnes is an 73 y.o. male.   Chief Complaint: Patient is here for EGD and ED. HPI: Patient is a 73 year old male who is here for now presents with solid food dysphagia of 6 months duration. He's been having more lately. He also complains of pain with swallowing. He points to lower sternal area site of bolus obstruction. He denies nausea vomiting anorexia or weight loss. He also denies melena or rectal bleeding. When he was seen in the office yesterday his stool was guaiac-negative. H&H one week ago was 9.5 and 29.6 and iron studies confirmed iron deficiency anemia. Colonoscopy was in May 2015 with removal of 2 small polyps and one was tubular adenoma. Patient is on low-dose aspirin but does not take other NSAIDs. Family history is negative for CRC.  Past Medical History  Diagnosis Date  . Hypertension   . Diabetes mellitus     says since 1979  . Hyperlipemia     Past Surgical History  Procedure Laterality Date  . Appendectomy    . Cystoscopy/retrograde/ureteroscopy/stone extraction with basket    . Colonoscopy  09/06/2011    Procedure: COLONOSCOPY;  Surgeon: Rogene Houston, MD;  Location: [facility];  Service: Endoscopy;  Laterality: N/A;  49    Family History  Problem Relation Age of Onset  . Diabetes Mother   . Diabetes Father   . Heart disease Father   . Diabetes Brother   . Diabetes Brother    Social History:  reports that he has never smoked. He does not have any smokeless tobacco history on file. He reports that he does not drink alcohol or use illicit drugs.  Allergies: No Known Allergies  Medications Prior to Admission  Medication Sig Dispense Refill  . benazepril (LOTENSIN) 10 MG tablet Take 10 mg by mouth daily.      Marland Kitchen glipiZIDE (GLUCOTROL) 10 MG tablet Take 10 mg by mouth 2 (two) times daily.      . Insulin Glargine (LANTUS SOLOSTAR) 100 UNIT/ML Solostar Pen Inject 20 Units into the skin daily at 10 pm.  5 pen  PRN  . pantoprazole (PROTONIX) 40 MG tablet  Take 1 tablet (40 mg total) by mouth daily.  30 tablet  3  . pravastatin (PRAVACHOL) 10 MG tablet Take 10 mg by mouth at bedtime.      Marland Kitchen aspirin 81 MG EC tablet Take 81 mg by mouth daily.        Marland Kitchen latanoprost (XALATAN) 0.005 % ophthalmic solution Place 1 drop into both eyes at bedtime.      Marland Kitchen tobramycin-dexamethasone (TOBRADEX) ophthalmic solution Place 1 drop into the right eye 4 (four) times daily.        No results found for this or any previous visit (from the past 48 hour(s)). No results found.  ROS  Blood pressure 140/76, pulse 73, temperature 97.8 F (36.6 C), temperature source Oral, resp. rate 18, height 5\' 5"  (1.651 m), weight 139 lb (63.05 kg), SpO2 100.00%. Physical Exam  Constitutional:  Well developed thin African American male in NAD.  HENT:  Mouth/Throat: Oropharynx is clear and moist.  Eyes: Conjunctivae are normal. No scleral icterus.  Neck: No thyromegaly present.  Cardiovascular: Normal rate and regular rhythm.   No murmur heard. Respiratory: Effort normal and breath sounds normal.  GI: Soft. He exhibits no distension and no mass. There is no tenderness.  Musculoskeletal: He exhibits no edema.  Lymphadenopathy:    He has no cervical adenopathy.  Neurological: He is  alert.  Skin: Skin is warm and dry.     Assessment/PlanL   Dysphagia and odynophagia. Chronic GERD. Iron deficiency anemia. Last colonoscopy was in May 2013. EGD and possible esophageal dilation.    Harwood Nall U 01/15/2014, 2:12 PM

## 2014-01-15 NOTE — Telephone Encounter (Signed)
Patient is sch'd for EGD/ED this afternoon, has question about his blood sugars -- please call @ [phone]

## 2014-01-15 NOTE — Op Note (Addendum)
Jacob Barnes, [id number]   ENDOSCOPY PROCEDURE REPORT  PATIENT: Jacob Barnes, Jacob Barnes  MR#: [id number] BIRTHDATE: [birth year] , 33  yrs. old GENDER: male ENDOSCOPIST: Hildred Laser, MD REFERRED BY:  [name], M.D. PROCEDURE DATE:  2014/01/19 PROCEDURE:  EGD, diagnostic, EGD w/ biopsy , and Maloney dilation of esophagus ASA CLASS:     Class I INDICATIONS:  dysphagia, odynophagia, and iron deficiency anemia. Patient's last colonoscopy was in May 2013.Marland Kitchen MEDICATIONS: Cetacaine spray for oral pharyngeal topical anesthesia, Meperidine (Demerol) 25 mg IV, and Versed 3 mg IV TOPICAL ANESTHETIC: Cetacaine Spray  DESCRIPTION OF PROCEDURE: After the risks benefits and alternatives of the procedure were thoroughly explained, informed consent was obtained.  The [id number] ([id number]) endoscope was introduced through the mouth and advanced to the second portion of the duodenum , Without limitations.  The instrument was slowly withdrawn as the mucosa was fully examined.    ESOPHAGUS: Two small erosions were found in the distal esophagus. There was a short mild and benign appearing stricture at the gastroesophageal junction.  The stricture was dilated using a 35mm (54Fr) Maloney dilator.  Following this dilation, there was a small mucosal rent.  STOMACH: Multiple erosions measuring 3 x 90mm in size were found at the incisura.   A single non-bleeding, deep and round ulcer measuring 8 x 41mm in size was found at the angularis.  Biopsies were taken around the ulcer and at edge of the ulcer.  DUODENUM: The duodenal mucosa showed no abnormalities in the bulb and 2nd part of the duodenum.  Retroflexed views revealed no abnormalities.     The scope was then withdrawn from the patient and the procedure completed.  COMPLICATIONS: There were no immediate complications.  ENDOSCOPIC IMPRESSION: 1.   Erosive esophagitis 2.   Distal esophageal stricture.status post  dilation with 54 [place] Maloney dilator. 3.   Status post maloney dilation 5.   Gastric ulcerat the angularis. Endoscopic appearance consistent with chronic benign ulcer, s/p biopsy  RECOMMENDATIONS: 1.  Await biopsy results 2.  Avoid NSAIDS.hold aspirin for now. 3.   increase pantoprazole to 40 mg by mouth twice a day  REPEAT EXAM:  eSigned:  Hildred Laser, MD January 19, 2014 6:30 PM Revised: 01/19/2014 6:30 PM   CC:  CPT CODES: ICD CODES:  The ICD and CPT codes recommended by this software are interpretations from the data that the clinical staff has captured with the software.  The verification of the translation of this report to the ICD and CPT codes and modifiers is the sole responsibility of the health care institution and practicing physician where this report was generated.  [employer] will not be held responsible for the validity of the ICD and CPT codes included on this report.  AMA assumes no liability for data contained or not contained herein. CPT is a Designer, television/film set of the [employer].  PATIENT NAME:  Jacob Barnes, Jacob Barnes MR#: [id number]

## 2014-01-16 LAB — GLUCOSE, CAPILLARY: Glucose-Capillary: 144 mg/dL — ABNORMAL HIGH (ref 70–99)

## 2014-01-20 ENCOUNTER — Encounter (HOSPITAL_COMMUNITY): Payer: Self-pay | Admitting: Internal Medicine

## 2014-01-27 ENCOUNTER — Telehealth: Payer: Self-pay | Admitting: Family Medicine

## 2014-01-28 ENCOUNTER — Encounter (INDEPENDENT_AMBULATORY_CARE_PROVIDER_SITE_OTHER): Payer: Self-pay | Admitting: *Deleted

## 2014-01-28 ENCOUNTER — Other Ambulatory Visit (INDEPENDENT_AMBULATORY_CARE_PROVIDER_SITE_OTHER): Payer: Self-pay | Admitting: *Deleted

## 2014-01-28 ENCOUNTER — Telehealth (INDEPENDENT_AMBULATORY_CARE_PROVIDER_SITE_OTHER): Payer: Self-pay | Admitting: *Deleted

## 2014-01-28 DIAGNOSIS — D509 Iron deficiency anemia, unspecified: Secondary | ICD-10-CM

## 2014-01-28 NOTE — Telephone Encounter (Signed)
Per Dr.[name] the patient will need to have labs drawn in 4 weeks.   

## 2014-01-28 NOTE — Telephone Encounter (Signed)
We don't have the form as of yet but will watch for it

## 2014-01-29 ENCOUNTER — Telehealth: Payer: Self-pay | Admitting: Family Medicine

## 2014-01-29 DIAGNOSIS — E1165 Type 2 diabetes mellitus with hyperglycemia: Secondary | ICD-10-CM

## 2014-01-29 DIAGNOSIS — R1013 Epigastric pain: Secondary | ICD-10-CM

## 2014-01-29 DIAGNOSIS — D61818 Other pancytopenia: Secondary | ICD-10-CM

## 2014-01-29 DIAGNOSIS — D538 Other specified nutritional anemias: Secondary | ICD-10-CM

## 2014-01-29 DIAGNOSIS — Z794 Long term (current) use of insulin: Secondary | ICD-10-CM

## 2014-01-29 DIAGNOSIS — Z91199 Patient's noncompliance with other medical treatment and regimen due to unspecified reason: Secondary | ICD-10-CM

## 2014-01-29 DIAGNOSIS — A048 Other specified bacterial intestinal infections: Secondary | ICD-10-CM

## 2014-01-29 DIAGNOSIS — R1310 Dysphagia, unspecified: Secondary | ICD-10-CM

## 2014-01-29 DIAGNOSIS — D649 Anemia, unspecified: Secondary | ICD-10-CM

## 2014-01-29 DIAGNOSIS — N183 Chronic kidney disease, stage 3 unspecified: Secondary | ICD-10-CM

## 2014-01-29 DIAGNOSIS — IMO0001 Reserved for inherently not codable concepts without codable children: Secondary | ICD-10-CM

## 2014-01-29 DIAGNOSIS — D509 Iron deficiency anemia, unspecified: Secondary | ICD-10-CM

## 2014-01-29 DIAGNOSIS — K219 Gastro-esophageal reflux disease without esophagitis: Secondary | ICD-10-CM

## 2014-01-29 DIAGNOSIS — Z9119 Patient's noncompliance with other medical treatment and regimen: Secondary | ICD-10-CM

## 2014-01-29 DIAGNOSIS — E785 Hyperlipidemia, unspecified: Secondary | ICD-10-CM

## 2014-01-29 DIAGNOSIS — I10 Essential (primary) hypertension: Secondary | ICD-10-CM

## 2014-01-29 MED ORDER — INSULIN GLARGINE 100 UNIT/ML SOLOSTAR PEN: 20.0000 [IU] | PEN_INJECTOR | Freq: Every day | SUBCUTANEOUS | Status: DC

## 2014-01-29 MED ORDER — ADVOCATE SAFETY LANCETS MISC: Status: DC

## 2014-01-29 MED ORDER — BENAZEPRIL HCL 10 MG PO TABS: 10.0000 mg | ORAL_TABLET | Freq: Every day | ORAL | Status: DC

## 2014-01-29 MED ORDER — PRAVASTATIN SODIUM 10 MG PO TABS: 10.0000 mg | ORAL_TABLET | Freq: Every day | ORAL | Status: DC

## 2014-01-29 MED ORDER — GLUCOSE BLOOD VI STRP: ORAL_STRIP | Status: DC

## 2014-01-29 MED ORDER — GLIPIZIDE 10 MG PO TABS: 10.0000 mg | ORAL_TABLET | Freq: Two times a day (BID) | ORAL | Status: DC

## 2014-01-29 NOTE — Telephone Encounter (Signed)
meds and diabetic supply sent

## 2014-01-29 NOTE — Telephone Encounter (Signed)
meds and supplies sent

## 2014-01-30 ENCOUNTER — Encounter (HOSPITAL_COMMUNITY): Payer: Self-pay | Admitting: Emergency Medicine

## 2014-01-30 ENCOUNTER — Emergency Department (HOSPITAL_COMMUNITY): Payer: Medicare Other

## 2014-01-30 ENCOUNTER — Emergency Department (HOSPITAL_COMMUNITY)
Admission: EM | Admit: 2014-01-30 | Discharge: 2014-01-30 | Disposition: A | Discharge status: Home / Self Care | Payer: Medicare Other | Attending: Emergency Medicine | Admitting: Emergency Medicine

## 2014-01-30 DIAGNOSIS — Z79899 Other long term (current) drug therapy: Secondary | ICD-10-CM | POA: Insufficient documentation

## 2014-01-30 DIAGNOSIS — R61 Generalized hyperhidrosis: Secondary | ICD-10-CM | POA: Diagnosis not present

## 2014-01-30 DIAGNOSIS — R410 Disorientation, unspecified: Secondary | ICD-10-CM | POA: Diagnosis not present

## 2014-01-30 DIAGNOSIS — I10 Essential (primary) hypertension: Secondary | ICD-10-CM | POA: Diagnosis not present

## 2014-01-30 DIAGNOSIS — E119 Type 2 diabetes mellitus without complications: Secondary | ICD-10-CM | POA: Diagnosis not present

## 2014-01-30 DIAGNOSIS — E162 Hypoglycemia, unspecified: Secondary | ICD-10-CM

## 2014-01-30 DIAGNOSIS — R4182 Altered mental status, unspecified: Secondary | ICD-10-CM | POA: Diagnosis present

## 2014-01-30 DIAGNOSIS — Z7952 Long term (current) use of systemic steroids: Secondary | ICD-10-CM | POA: Insufficient documentation

## 2014-01-30 LAB — COMPREHENSIVE METABOLIC PANEL
ALT: 17 U/L (ref 0–53)
AST: 38 U/L — ABNORMAL HIGH (ref 0–37)
Albumin: 3.6 g/dL (ref 3.5–5.2)
Alkaline Phosphatase: 79 U/L (ref 39–117)
Anion gap: 10 (ref 5–15)
BILIRUBIN TOTAL: 0.2 mg/dL — AB (ref 0.3–1.2)
BUN: 27 mg/dL — ABNORMAL HIGH (ref 6–23)
CALCIUM: 9.1 mg/dL (ref 8.4–10.5)
CHLORIDE: 104 meq/L (ref 96–112)
CO2: 28 meq/L (ref 19–32)
Creatinine, Ser: 1.58 mg/dL — ABNORMAL HIGH (ref 0.50–1.35)
GFR calc non Af Amer: 41 mL/min — ABNORMAL LOW (ref >90)
GFR, EST AFRICAN AMERICAN: 48 mL/min — AB (ref >90)
GLUCOSE: 33 mg/dL — AB (ref 70–99)
Potassium: 3.9 mEq/L (ref 3.7–5.3)
SODIUM: 142 meq/L (ref 137–147)
Total Protein: 8.2 g/dL (ref 6.0–8.3)

## 2014-01-30 LAB — CBC WITH DIFFERENTIAL/PLATELET
Basophils Absolute: 0 10*3/uL (ref 0.0–0.1)
Basophils Relative: 0 % (ref 0–1)
EOS ABS: 0 10*3/uL (ref 0.0–0.7)
Eosinophils Relative: 0 % (ref 0–5)
HCT: 33.8 % — ABNORMAL LOW (ref 39.0–52.0)
HEMOGLOBIN: 11 g/dL — AB (ref 13.0–17.0)
LYMPHS ABS: 4.3 10*3/uL — AB (ref 0.7–4.0)
LYMPHS PCT: 65 % — AB (ref 12–46)
MCH: 30.5 pg (ref 26.0–34.0)
MCHC: 32.5 g/dL (ref 30.0–36.0)
MCV: 93.6 fL (ref 78.0–100.0)
MONOS PCT: 18 % — AB (ref 3–12)
Monocytes Absolute: 1.2 10*3/uL — ABNORMAL HIGH (ref 0.1–1.0)
NEUTROS PCT: 16 % — AB (ref 43–77)
Neutro Abs: 1.1 10*3/uL — ABNORMAL LOW (ref 1.7–7.7)
Platelets: 156 10*3/uL (ref 150–400)
RBC: 3.61 MIL/uL — AB (ref 4.22–5.81)
RDW: 15.3 % (ref 11.5–15.5)
WBC: 6.6 10*3/uL (ref 4.0–10.5)

## 2014-01-30 LAB — CBG MONITORING, ED
Comment 2: 636501281
Glucose-Capillary: 21 mg/dL — CL (ref 70–99)
Glucose-Capillary: 217 mg/dL — ABNORMAL HIGH (ref 70–99)

## 2014-01-30 MED ORDER — DEXTROSE 50 % IV SOLN
1.0000 | Freq: Once | INTRAVENOUS | Status: AC
  Administered 2014-01-30: 50 mL via INTRAVENOUS

## 2014-01-30 MED ORDER — DEXTROSE 50 % IV SOLN
INTRAVENOUS | Status: AC
Start: 2014-01-30 — End: 2014-01-30
  Administered 2014-01-30: 50 mL via INTRAVENOUS
  Filled 2014-01-30: qty 50

## 2014-01-30 NOTE — ED Notes (Signed)
CBG 21. RN notified

## 2014-01-30 NOTE — ED Notes (Signed)
MD at bedside. 

## 2014-01-30 NOTE — ED Notes (Signed)
Pt given 2 cups of orange juice with 4 packs of sugar.

## 2014-01-30 NOTE — ED Notes (Signed)
Dr Roderic Palau informed of pts glucose 217.  Also informed Dr Roderic Palau that pt reports that his gluocse was 66 at home this am and he took his diabetic meds.  Education done with pt and pt's daughter regarding low and high glucose.  Both verbalizes understanding.

## 2014-01-30 NOTE — Discharge Instructions (Signed)
Decrease your insulin at night to 16 units.   Follow up with your md next week

## 2014-01-30 NOTE — ED Notes (Signed)
Pt brought over from rad dept, pt co AMA, numbness in extremities, diaphoretic, pt diabetic, FS 21

## 2014-01-30 NOTE — ED Provider Notes (Signed)
CSN: [id number]     Arrival date & time 01/30/14  1144 History   First MD Initiated Contact with Patient 01/30/14 1157     This chart was scribed for Jacob Diego, MD by Forrestine Him, ED Scribe. This patient was seen in room APA02/APA02 and the patient's care was started 12:02 PM.   Chief Complaint  Patient presents with  . Altered Mental Status   Patient is a 73 y.o. male presenting with altered mental status. The history is provided by the patient and a relative. No language interpreter was used.  Altered Mental Status Presenting symptoms: behavior changes, confusion and disorientation   Severity:  Mild Most recent episode:  Today Timing:  Constant Progression:  Unchanged Chronicity:  New Associated symptoms: no abdominal pain, no hallucinations, no headaches, no rash and no seizures     HPI Comments: Jacob Barnes is a 73 y.o. male with a PMHx of HTN, DM, and hyperlipemia who presents to the Emergency Department here for altered mental status onset just prior to arrival.  Last known well yesterday evening. Pt was sitting in the radiology department waiting for a family member when he noticed his hands feeling numb. Daughter states this is baseline for him when his sugars are very low. Afterwards daughter states pt was diaphoretic, disoriented, and some what confused. Mr. Rivet states he had a small amount of grits early this mornning at approximately 7 AM. However, sugar in ED was 21 prior to giving small amount of crackers. No other associated symptoms at this time. No known allergies to medications. No other concerns this visit.  Past Medical History  Diagnosis Date  . Hypertension   . Diabetes mellitus     says since 1979  . Hyperlipemia    Past Surgical History  Procedure Laterality Date  . Appendectomy    . Cystoscopy/retrograde/ureteroscopy/stone extraction with basket    . Colonoscopy  09/06/2011    Procedure: COLONOSCOPY;  Surgeon: Rogene Houston, MD;  [facility];  Service: Endoscopy;  Laterality: N/A;  830  . Esophagogastroduodenoscopy N/A 01/15/2014    Procedure: ESOPHAGOGASTRODUODENOSCOPY (EGD);  Surgeon: Rogene Houston, MD;  Location: [facility];  Service: Endoscopy;  Laterality: N/A;  200  . Maloney dilation N/A 01/15/2014    Procedure: Venia Minks DILATION;  Surgeon: Rogene Houston, MD;  Location: [facility];  Service: Endoscopy;  Laterality: N/A;   Family History  Problem Relation Age of Onset  . Diabetes Mother   . Diabetes Father   . Heart disease Father   . Diabetes Brother   . Diabetes Brother    History  Substance Use Topics  . Smoking status: Never Smoker   . Smokeless tobacco: Not on file  . Alcohol Use: No    Review of Systems  Constitutional: Positive for diaphoresis. Negative for appetite change and fatigue.  HENT: Negative for congestion, ear discharge and sinus pressure.   Eyes: Negative for discharge.  Respiratory: Negative for cough.   Cardiovascular: Negative for chest pain.  Gastrointestinal: Negative for abdominal pain and diarrhea.  Genitourinary: Negative for frequency and hematuria.  Musculoskeletal: Negative for back pain.  Skin: Negative for rash.  Neurological: Negative for seizures and headaches.  Psychiatric/Behavioral: Positive for confusion. Negative for hallucinations.      Allergies  Review of patient's allergies indicates no known allergies.  Home Medications   Prior to Admission medications   Medication Sig Start Date End Date Taking? Authorizing Provider  ADVOCATE SAFETY LANCETS  MISC For use three times daily testing dx. E10.65 01/29/14   Fayrene Helper, MD  benazepril (LOTENSIN) 10 MG tablet Take 1 tablet (10 mg total) by mouth daily. 01/29/14   Fayrene Helper, MD  glipiZIDE (GLUCOTROL) 10 MG tablet Take 1 tablet (10 mg total) by mouth 2 (two) times daily. 01/29/14   Fayrene Helper, MD  glucose blood (ADVOCATE TEST) test strip Use as instructed three times daily dx  E10.65 01/29/14   Fayrene Helper, MD  Insulin Glargine (LANTUS SOLOSTAR) 100 UNIT/ML Solostar Pen Inject 20 Units into the skin daily at 10 pm. 01/29/14   Fayrene Helper, MD  latanoprost (XALATAN) 0.005 % ophthalmic solution Place 1 drop into both eyes at bedtime.    Historical Provider, MD  pantoprazole (PROTONIX) 40 MG tablet Take 1 tablet (40 mg total) by mouth 2 (two) times daily before a meal. 01/15/14   Rogene Houston, MD  pravastatin (PRAVACHOL) 10 MG tablet Take 1 tablet (10 mg total) by mouth at bedtime. 01/29/14   Fayrene Helper, MD  tobramycin-dexamethasone [facility]) ophthalmic solution Place 1 drop into the right eye 4 (four) times daily.    Historical Provider, MD   Triage Vitals: BP 132/74  Resp 11  Wt 139 lb (63.05 kg)  SpO2 93%   Physical Exam  Constitutional: He is oriented to person, place, and time. He appears well-developed.  Mildly confused  HENT:  Head: Normocephalic.  Eyes: Conjunctivae and EOM are normal. No scleral icterus.  Neck: Neck supple. No thyromegaly present.  Cardiovascular: Normal rate and regular rhythm.  Exam reveals no gallop and no friction rub.   No murmur heard. Pulmonary/Chest: No stridor. He has no wheezes. He has no rales. He exhibits no tenderness.  Abdominal: He exhibits no distension. There is no tenderness. There is no rebound.  Musculoskeletal: Normal range of motion. He exhibits no edema.  Lymphadenopathy:    He has no cervical adenopathy.  Neurological: He is oriented to person, place, and time. He exhibits normal muscle tone. Coordination normal.  Skin: No rash noted. No erythema.  Psychiatric: He has a normal mood and affect. His behavior is normal.    ED Course  Procedures (including critical care time)  DIAGNOSTIC STUDIES: Oxygen Saturation is 93% on RA, low by my interpretation.    COORDINATION OF CARE: 12:11 PM- Will give dextrose 50% solution. Wil order CT head without contrast, EKG 12-lead, CBC, CMP, and CBG  monitoring. Discussed treatment plan with pt at bedside and pt agreed to plan.     Labs Review Labs Reviewed  CBC WITH DIFFERENTIAL - Abnormal; Notable for the following:    RBC 3.61 (*)    Hemoglobin 11.0 (*)    HCT 33.8 (*)    Neutrophils Relative % 16 (*)    Neutro Abs 1.1 (*)    Lymphocytes Relative 65 (*)    Lymphs Abs 4.3 (*)    Monocytes Relative 18 (*)    Monocytes Absolute 1.2 (*)    All other components within normal limits  COMPREHENSIVE METABOLIC PANEL - Abnormal; Notable for the following:    Glucose, Bld 33 (*)    BUN 27 (*)    Creatinine, Ser 1.58 (*)    AST 38 (*)    Total Bilirubin 0.2 (*)    GFR calc non Af Amer 41 (*)    GFR calc Af Amer 48 (*)    All other components within normal limits  CBG MONITORING, ED -  Abnormal; Notable for the following:    Glucose-Capillary 21 (*)    All other components within normal limits  CBG MONITORING, ED - Abnormal; Notable for the following:    Glucose-Capillary 217 (*)    All other components within normal limits    Imaging Review Ct Head Wo Contrast  01/30/2014   CLINICAL DATA:  Altered mental status today. Patient with a past medical history of hypertension and diabetes. Patient reportedly had a low blood sugar of 21 today during which time he was diaphoretic, disoriented and confused.  EXAM: CT HEAD WITHOUT CONTRAST  TECHNIQUE: Contiguous axial images were obtained from the base of the skull through the vertex without intravenous contrast.  COMPARISON:  None.  FINDINGS: Ventricles are normal in size, for this patient's age, and normal in configuration. There are no parenchymal masses or mass effect. There is no evidence of an infarct. Mild periventricular white matter hypoattenuation is noted consistent with chronic microvascular ischemic change.  There are no extra-axial masses or abnormal fluid collections.  There is no intracranial hemorrhage.  Visualized sinuses and mastoid air cells are clear. No skull lesion.   IMPRESSION: No acute intracranial abnormalities.   Electronically Signed   By: Lajean Manes M.D.   On: 01/30/2014 12:41     EKG Interpretation None      MDM   Final diagnoses:  None      I personally performed the services described in this documentation, which was scribed in my presence. The recorded information has been reviewed and is accurate.  The chart was scribed for me under my direct supervision.  I personally performed the history, physical, and medical decision making and all procedures in the evaluation of this patient.Jacob Diego, MD 02/03/14 [phone]

## 2014-01-30 NOTE — ED Notes (Signed)
Called dietary for food tray per Dr Roderic Palau.  Pt given peanut butter and crackers with regular coke.

## 2014-01-30 NOTE — ED Notes (Signed)
CRITICAL VALUE ALERT  Critical value received: Glucose 33  Date of notification: 01/30/2014  Time of notification:  9449  Critical value read back:yes   Nurse who received alert:  Allegra Lai, RN  MD notified (1st page):  Dr Roderic Palau at [phone]  Time of first page:  1042  Informed this glucose was at 1200 blood draw and pt has been given Orange juice with 4 packs of sugar po and has eaten peanut butter and crackers and currently eat meal.  Pt alert.

## 2014-01-30 NOTE — ED Notes (Signed)
Pt more alert, following commands and oriented x3.

## 2014-02-06 ENCOUNTER — Other Ambulatory Visit: Payer: Self-pay

## 2014-02-06 DIAGNOSIS — K219 Gastro-esophageal reflux disease without esophagitis: Secondary | ICD-10-CM

## 2014-02-10 ENCOUNTER — Ambulatory Visit: Payer: PRIVATE HEALTH INSURANCE | Admitting: Family Medicine

## 2014-02-10 ENCOUNTER — Telehealth: Payer: Self-pay | Admitting: Family Medicine

## 2014-02-11 NOTE — Telephone Encounter (Signed)
Concerns about appointment times and dates resolved

## 2014-03-02 ENCOUNTER — Other Ambulatory Visit (HOSPITAL_COMMUNITY): Payer: Self-pay | Admitting: Nephrology

## 2014-03-02 DIAGNOSIS — N183 Chronic kidney disease, stage 3 unspecified: Secondary | ICD-10-CM

## 2014-03-11 LAB — BASIC METABOLIC PANEL WITH GFR
BUN: 23 mg/dL (ref 6–23)
CALCIUM: 9 mg/dL (ref 8.4–10.5)
CO2: 29 mEq/L (ref 19–32)
Chloride: 104 mEq/L (ref 96–112)
Creat: 1.42 mg/dL — ABNORMAL HIGH (ref 0.50–1.35)
GFR, EST AFRICAN AMERICAN: 56 mL/min — AB
GFR, EST NON AFRICAN AMERICAN: 48 mL/min — AB
Glucose, Bld: 94 mg/dL (ref 70–99)
Potassium: 4.4 mEq/L (ref 3.5–5.3)
SODIUM: 139 meq/L (ref 135–145)

## 2014-03-11 LAB — HEMOGLOBIN A1C
HEMOGLOBIN A1C: 8.9 % — AB (ref <5.7)
MEAN PLASMA GLUCOSE: 209 mg/dL — AB (ref <117)

## 2014-03-12 ENCOUNTER — Ambulatory Visit (HOSPITAL_COMMUNITY)
Admission: RE | Admit: 2014-03-12 | Discharge: 2014-03-12 | Disposition: A | Discharge status: Home / Self Care | Payer: PRIVATE HEALTH INSURANCE | Source: Ambulatory Visit | Attending: Nephrology | Admitting: Nephrology

## 2014-03-12 DIAGNOSIS — I129 Hypertensive chronic kidney disease with stage 1 through stage 4 chronic kidney disease, or unspecified chronic kidney disease: Secondary | ICD-10-CM | POA: Insufficient documentation

## 2014-03-12 DIAGNOSIS — N183 Chronic kidney disease, stage 3 unspecified: Secondary | ICD-10-CM

## 2014-03-12 DIAGNOSIS — N289 Disorder of kidney and ureter, unspecified: Secondary | ICD-10-CM | POA: Diagnosis not present

## 2014-03-12 DIAGNOSIS — Z9049 Acquired absence of other specified parts of digestive tract: Secondary | ICD-10-CM | POA: Insufficient documentation

## 2014-03-12 DIAGNOSIS — E119 Type 2 diabetes mellitus without complications: Secondary | ICD-10-CM | POA: Insufficient documentation

## 2014-03-17 ENCOUNTER — Ambulatory Visit (INDEPENDENT_AMBULATORY_CARE_PROVIDER_SITE_OTHER): Payer: PRIVATE HEALTH INSURANCE | Admitting: Family Medicine

## 2014-03-17 ENCOUNTER — Encounter: Payer: Self-pay | Admitting: Family Medicine

## 2014-03-17 ENCOUNTER — Ambulatory Visit (INDEPENDENT_AMBULATORY_CARE_PROVIDER_SITE_OTHER): Payer: PRIVATE HEALTH INSURANCE

## 2014-03-17 VITALS — BP 140/80 | HR 74 | Resp 16 | Ht 65.0 in | Wt 137.0 lb

## 2014-03-17 DIAGNOSIS — Z23 Encounter for immunization: Secondary | ICD-10-CM | POA: Insufficient documentation

## 2014-03-17 DIAGNOSIS — E1165 Type 2 diabetes mellitus with hyperglycemia: Principal | ICD-10-CM

## 2014-03-17 DIAGNOSIS — IMO0001 Reserved for inherently not codable concepts without codable children: Secondary | ICD-10-CM

## 2014-03-17 DIAGNOSIS — E785 Hyperlipidemia, unspecified: Secondary | ICD-10-CM

## 2014-03-17 DIAGNOSIS — I10 Essential (primary) hypertension: Secondary | ICD-10-CM

## 2014-03-17 DIAGNOSIS — Z794 Long term (current) use of insulin: Principal | ICD-10-CM

## 2014-03-17 DIAGNOSIS — E1065 Type 1 diabetes mellitus with hyperglycemia: Secondary | ICD-10-CM

## 2014-03-17 NOTE — Patient Instructions (Addendum)
F/u in 3 month, call iof you need me before  Flu vaccine today  Blood sugar has improved slightly , which is good, now 8.9, plan for 8.5 or less in next 3 month, you can do this  NEED TO TAKE 20 units of lantus every day and continue glipizide as you are currently taking it  CONTINUE to test and RECORD TWICE dAILY, ARRANGE for your sister to call nurse weekly with numbers, THE BIG THING TO FOCUS ON iS DISCIPLINE IN EATING  Goal for fasting blood sugar ranges from 100 to 140 and 2 hours after any meal or at bedtime should be between 150 to  200   Fasting lipid, cmp and EGFr and HBA1C in 3 month  All the best for 2016!

## 2014-03-17 NOTE — Assessment & Plan Note (Signed)
Vaccine administered at visit.  

## 2014-03-17 NOTE — Assessment & Plan Note (Addendum)
Increase dose of lantus to 20 units , taking 16 units currently, improved slightly but still not at goal, non compliant with prescribed dose but doing better as far as testing and regualr eating Patient advised to reduce carb and sweets, commit to regular physical activity, take meds as prescribed, test blood as directed, and attempt to lose weight, to improve blood sugar control.

## 2014-03-17 NOTE — Progress Notes (Signed)
   Subjective:    Patient ID: Jacob Barnes, male    DOB: 1941-02-14, 73 y.o.   MRN: [id number]  HPI The PT is here for follow up and re-evaluation of chronic medical conditions, medication management and review of any available recent lab and radiology data.  Preventive health is updated, specifically  Cancer screening and Immunization.   Questions or concerns regarding consultations or procedures which the PT has had in the interim are  addressed. The PT denies any adverse reactions to current medications since the last visit.  There are no new concerns.  There are no specific complaints       Review of Systems See HPI Denies recent fever or chills. Denies sinus pressure, nasal congestion, ear pain or sore throat. Denies chest congestion, productive cough or wheezing. Denies chest pains, palpitations and leg swelling Denies abdominal pain, nausea, vomiting,diarrhea or constipation.   Denies dysuria, frequency, hesitancy or incontinence. Denies joint pain, swelling and limitation in mobility. Denies headaches, seizures, numbness, or tingling. Denies depression, anxiety or insomnia. Denies skin break down or rash.        Objective:   Physical Exam BP 140/80 mmHg  Pulse 74  Resp 16  Ht 5\' 5"  (1.651 m)  Wt 137 lb (62.143 kg)  BMI 22.80 kg/m2  SpO2 100% Patient alert and oriented and in no cardiopulmonary distress.  HEENT: No facial asymmetry, EOMI,   oropharynx pink and moist.  Neck supple no JVD, no mass.  Chest: Clear to auscultation bilaterally.  CVS: S1, S2 no murmurs, no S3.Regular rate.  ABD: Soft non tender.   Ext: No edema  MS: Adequate ROM spine, shoulders, hips and knees.  Skin: Intact, no ulcerations or rash noted.  Psych: Good eye contact, normal affect. Memory intact not anxious or depressed appearing.  CNS: CN 2-12 intact, power,  normal throughout.no focal deficits noted.        Assessment & Plan:  Diabetes mellitus, insulin dependent  (IDDM), uncontrolled Increase dose of lantus to 20 units , taking 16 units currently, improved slightly but still not at goal, non compliant with prescribed dose but doing better as far as testing and regualr eating Patient advised to reduce carb and sweets, commit to regular physical activity, take meds as prescribed, test blood as directed, and attempt to lose weight, to improve blood sugar control.   Need for prophylactic vaccination and inoculation against influenza Vaccine administered at visit.   Essential hypertension Sub optimal contreol. No med change DASH diet and commitment to daily physical activity for a minimum of 30 minutes discussed and encouraged, as a part of hypertension management. The importance of attaining a healthy weight is also discussed.    Hyperlipidemia Hyperlipidemia:Low fat diet discussed and encouraged.  Updated lab needed at/ before next visit.

## 2014-03-22 NOTE — Assessment & Plan Note (Signed)
Hyperlipidemia:Low fat diet discussed and encouraged.  Updated lab needed at/ before next visit.  

## 2014-03-22 NOTE — Assessment & Plan Note (Signed)
Sub optimal contreol. No med change DASH diet and commitment to daily physical activity for a minimum of 30 minutes discussed and encouraged, as a part of hypertension management. The importance of attaining a healthy weight is also discussed.

## 2014-03-25 ENCOUNTER — Encounter: Payer: Self-pay | Admitting: Pediatrics

## 2014-04-08 ENCOUNTER — Encounter (INDEPENDENT_AMBULATORY_CARE_PROVIDER_SITE_OTHER): Payer: Self-pay | Admitting: *Deleted

## 2014-04-16 ENCOUNTER — Encounter (INDEPENDENT_AMBULATORY_CARE_PROVIDER_SITE_OTHER): Payer: Self-pay | Admitting: Internal Medicine

## 2014-04-16 ENCOUNTER — Encounter (INDEPENDENT_AMBULATORY_CARE_PROVIDER_SITE_OTHER): Payer: Self-pay | Admitting: *Deleted

## 2014-04-16 ENCOUNTER — Other Ambulatory Visit (INDEPENDENT_AMBULATORY_CARE_PROVIDER_SITE_OTHER): Payer: Self-pay | Admitting: *Deleted

## 2014-04-16 ENCOUNTER — Ambulatory Visit (INDEPENDENT_AMBULATORY_CARE_PROVIDER_SITE_OTHER): Payer: Medicare PPO | Admitting: Internal Medicine

## 2014-04-16 VITALS — BP 122/50 | HR 72 | Temp 98.5°F | Ht 65.0 in | Wt 139.5 lb

## 2014-04-16 DIAGNOSIS — K279 Peptic ulcer, site unspecified, unspecified as acute or chronic, without hemorrhage or perforation: Secondary | ICD-10-CM

## 2014-04-16 DIAGNOSIS — K259 Gastric ulcer, unspecified as acute or chronic, without hemorrhage or perforation: Secondary | ICD-10-CM

## 2014-04-16 NOTE — Progress Notes (Addendum)
Subjective:    Patient ID: Jacob Barnes, male    DOB: Jan 26, 1941, 74 y.o.   MRN: [id number]  HPI Presents today for f/u after recent EGD/ED for dysphagia and IDA. EGD in October revealed erosive esophagitis. Distal esophagus with stricture. Status post dilatation. Gastric ulcerate at angularis. Endoscopic appearance consistent with chronic benign ulcer. H. Pylori positive in October and was not treated. He tells me he is doing good, Appetite is good. No weight loss.  There is not abdominal pain. Has a BM daily. No melena or BRRB. He lives at home with family. Daughter and sister do cooking. He is avoiding spicy foods. No NSAIDs. No Good Powders or Aleve. Hemoglobin has improved. Hx of H. Pylori in past with tx. H. Pylori + in October: not treated.  Recent gastric biopsy negative for H. Pylori.  Patient is now seeing Dr. Hinda Lenis for his kidney function.     01/15/2014 EGD/ED: dysphagia, odynophagia, and Iron deficiency anemia: Dr. Demetrio Lapping Erosive esophagitis. Distal esophagus with stricture. Status post dilatation. Gastric ulcerate at angularis. Endoscopic appearance consistent with chronic benign ulcer.  Gastric biopsy reveals benign ulcer. H. pylori stains are negative. Results reviewed with patient; he states he can swallow better; H&H in 4 weeks. EGD in 12 weeks to document healing of gastric ulcer.   CBC Latest Ref Rng 01/30/2014 01/08/2014 12/13/2013  WBC 4.0 - 10.5 K/uL 6.6 3.9(L) 4.7  Hemoglobin 13.0 - 17.0 g/dL 11.0(L) 9.5(L) 8.0(L)  Hematocrit 39.0 - 52.0 % 33.8(L) 29.6(L) 23.3(L)  Platelets 150 - 400 K/uL 156 145(L) 121(L)      BMET    Component Value Date/Time   NA 139 03/11/2014 0933   K 4.4 03/11/2014 0933   CL 104 03/11/2014 0933   CO2 29 03/11/2014 0933   GLUCOSE 94 03/11/2014 0933   BUN 23 03/11/2014 0933   CREATININE 1.42* 03/11/2014 0933   CREATININE 1.58* 01/30/2014 1208   CALCIUM 9.0 03/11/2014 0933   GFRNONAA 48* 03/11/2014 0933   GFRNONAA 41*  01/30/2014 1208   GFRAA 56* 03/11/2014 0933   GFRAA 48* 01/30/2014 1208        CBC    Component Value Date/Time   WBC 6.6 01/30/2014 1208   RBC 3.61* 01/30/2014 1208   HGB 11.0* 01/30/2014 1208   HCT 33.8* 01/30/2014 1208   PLT 156 01/30/2014 1208   MCV 93.6 01/30/2014 1208   MCH 30.5 01/30/2014 1208   MCHC 32.5 01/30/2014 1208   RDW 15.3 01/30/2014 1208   LYMPHSABS 4.3* 01/30/2014 1208   MONOABS 1.2* 01/30/2014 1208   EOSABS 0.0 01/30/2014 1208   BASOSABS 0.0 01/30/2014 1208       Review of Systems Past Medical History  Diagnosis Date  . Hypertension   . Diabetes mellitus     says since 1979  . Hyperlipemia   . Glaucoma     Past Surgical History  Procedure Laterality Date  . Appendectomy    . Cystoscopy/retrograde/ureteroscopy/stone extraction with basket    . Colonoscopy  09/06/2011    Procedure: COLONOSCOPY;  Surgeon: Rogene Houston, MD;  [facility]: [facility];  Service: Endoscopy;  Laterality: N/A;  830  . Esophagogastroduodenoscopy N/A 01/15/2014    Procedure: ESOPHAGOGASTRODUODENOSCOPY (EGD);  Surgeon: Rogene Houston, MD;  [facility];  Service: Endoscopy;  Laterality: N/A;  200  . Maloney dilation N/A 01/15/2014    Procedure: Venia Minks DILATION;  Surgeon: Rogene Houston, MD;  [facility];  Service: Endoscopy;  Laterality:  N/A;    No Known Allergies  Current Outpatient Prescriptions on File Prior to Visit  Medication Sig Dispense Refill  . ADVOCATE SAFETY LANCETS MISC For use three times daily testing dx. E10.65 300 each 3  . benazepril (LOTENSIN) 10 MG tablet Take 1 tablet (10 mg total) by mouth daily. 90 tablet 1  . ferrous sulfate 325 (65 FE) MG tablet Take 325 mg by mouth daily with breakfast.     . glipiZIDE (GLUCOTROL) 10 MG tablet Take 1 tablet (10 mg total) by mouth 2 (two) times daily. 180 tablet 1  . glucose blood (ADVOCATE TEST) test strip Use as instructed three times daily dx E10.65 100 each 12  . Insulin Glargine  (LANTUS SOLOSTAR) 100 UNIT/ML Solostar Pen Inject 20 Units into the skin daily at 10 pm. 15 pen 1  . latanoprost (XALATAN) 0.005 % ophthalmic solution Place 1 drop into both eyes at bedtime.    . pantoprazole (PROTONIX) 40 MG tablet Take 1 tablet (40 mg total) by mouth 2 (two) times daily before a meal. 60 tablet 3  . pravastatin (PRAVACHOL) 10 MG tablet Take 1 tablet (10 mg total) by mouth at bedtime. 90 tablet 1   No current facility-administered medications on file prior to visit.        Objective:   Physical Exam   Filed Vitals:   04/16/14 1118  Height: 5\' 5"  (1.651 m)  Weight: 139 lb 8 oz (63.277 kg)   Alert and oriented. Skin warm and dry. Oral mucosa is moist.   . Sclera anicteric, conjunctivae is pink. Thyroid not enlarged. No cervical lymphadenopathy. Lungs clear. Heart regular rate and rhythm.  Abdomen is soft. Bowel sounds are positive. No hepatomegaly. No abdominal masses felt. No tenderness.  No edema to lower extremities.         Assessment & Plan:  PUD. Needs EGD to document healing. Sister would like to hold off on blood work (CBC) till patient receives his insurance card.

## 2014-04-16 NOTE — Patient Instructions (Addendum)
EGD. The risks and benefits such as perforation, bleeding, and infection were reviewed with the patient and is agreeable. 

## 2014-04-22 ENCOUNTER — Ambulatory Visit (HOSPITAL_COMMUNITY)
Admission: RE | Admit: 2014-04-22 | Discharge: 2014-04-22 | Disposition: A | Discharge status: Home / Self Care | Payer: Medicare PPO | Source: Ambulatory Visit | Attending: Internal Medicine | Admitting: Internal Medicine

## 2014-04-22 ENCOUNTER — Encounter (HOSPITAL_COMMUNITY): Payer: Self-pay | Admitting: *Deleted

## 2014-04-22 ENCOUNTER — Encounter (HOSPITAL_COMMUNITY)
Admission: RE | Disposition: A | Discharge status: Home / Self Care | Payer: Self-pay | Source: Ambulatory Visit | Attending: Internal Medicine

## 2014-04-22 DIAGNOSIS — K259 Gastric ulcer, unspecified as acute or chronic, without hemorrhage or perforation: Secondary | ICD-10-CM | POA: Diagnosis not present

## 2014-04-22 DIAGNOSIS — I10 Essential (primary) hypertension: Secondary | ICD-10-CM | POA: Insufficient documentation

## 2014-04-22 DIAGNOSIS — E785 Hyperlipidemia, unspecified: Secondary | ICD-10-CM | POA: Insufficient documentation

## 2014-04-22 DIAGNOSIS — R1310 Dysphagia, unspecified: Secondary | ICD-10-CM | POA: Diagnosis present

## 2014-04-22 DIAGNOSIS — D509 Iron deficiency anemia, unspecified: Secondary | ICD-10-CM | POA: Diagnosis not present

## 2014-04-22 DIAGNOSIS — E119 Type 2 diabetes mellitus without complications: Secondary | ICD-10-CM | POA: Insufficient documentation

## 2014-04-22 DIAGNOSIS — K222 Esophageal obstruction: Secondary | ICD-10-CM | POA: Diagnosis not present

## 2014-04-22 DIAGNOSIS — K219 Gastro-esophageal reflux disease without esophagitis: Secondary | ICD-10-CM

## 2014-04-22 DIAGNOSIS — Z8711 Personal history of peptic ulcer disease: Secondary | ICD-10-CM | POA: Diagnosis not present

## 2014-04-22 DIAGNOSIS — Z794 Long term (current) use of insulin: Secondary | ICD-10-CM | POA: Diagnosis not present

## 2014-04-22 HISTORY — PX: ESOPHAGOGASTRODUODENOSCOPY: SHX5428

## 2014-04-22 HISTORY — PX: MALONEY DILATION: SHX5535

## 2014-04-22 LAB — HEMOGLOBIN AND HEMATOCRIT, BLOOD
HCT: 30.7 % — ABNORMAL LOW (ref 39.0–52.0)
HEMOGLOBIN: 10.3 g/dL — AB (ref 13.0–17.0)

## 2014-04-22 LAB — GLUCOSE, CAPILLARY: Glucose-Capillary: 108 mg/dL — ABNORMAL HIGH (ref 70–99)

## 2014-04-22 SURGERY — EGD (ESOPHAGOGASTRODUODENOSCOPY)
Anesthesia: Moderate Sedation

## 2014-04-22 MED ORDER — MEPERIDINE HCL 50 MG/ML IJ SOLN
INTRAMUSCULAR | Status: DC | PRN
Start: 2014-04-22 — End: 2014-04-22
  Administered 2014-04-22: 25 mg via INTRAVENOUS

## 2014-04-22 MED ORDER — MIDAZOLAM HCL 5 MG/5ML IJ SOLN
INTRAMUSCULAR | Status: AC
  Filled 2014-04-22: qty 10

## 2014-04-22 MED ORDER — MEPERIDINE HCL 50 MG/ML IJ SOLN
INTRAMUSCULAR | Status: DC
Start: 2014-04-22 — End: 2014-04-22
  Filled 2014-04-22: qty 1

## 2014-04-22 MED ORDER — PANTOPRAZOLE SODIUM 40 MG PO TBEC: 40.0000 mg | DELAYED_RELEASE_TABLET | Freq: Every day | ORAL | Status: DC

## 2014-04-22 MED ORDER — STERILE WATER FOR IRRIGATION IR SOLN
Status: DC | PRN
  Administered 2014-04-22: 14:00:00

## 2014-04-22 MED ORDER — SODIUM CHLORIDE 0.9 % IV SOLN
INTRAVENOUS | Status: DC
  Administered 2014-04-22: 1000 mL via INTRAVENOUS

## 2014-04-22 MED ORDER — MIDAZOLAM HCL 5 MG/5ML IJ SOLN
INTRAMUSCULAR | Status: DC | PRN
  Administered 2014-04-22: 2 mg via INTRAVENOUS

## 2014-04-22 NOTE — Discharge Instructions (Signed)
Resume usual medications and diet. No driving for 24 hours. Physician will call with results of blood test(hemoglobin and hematocrit)  Gastrointestinal Endoscopy, Care After Refer to this sheet in the next few weeks. These instructions provide you with information on caring for yourself after your procedure. Your caregiver may also give you more specific instructions. Your treatment has been planned according to current medical practices, but problems sometimes occur. Call your caregiver if you have any problems or questions after your procedure. HOME CARE INSTRUCTIONS  If you were given medicine to help you relax (sedative), do not drive, operate machinery, or sign important documents for 24 hours.  Avoid alcohol and hot or warm beverages for the first 24 hours after the procedure.  Only take over-the-counter or prescription medicines for pain, discomfort, or fever as directed by your caregiver. You may resume taking your normal medicines unless your caregiver tells you otherwise. Ask your caregiver when you may resume taking medicines that may cause bleeding, such as aspirin, clopidogrel, or warfarin.  You may return to your normal diet and activities on the day after your procedure, or as directed by your caregiver. Walking may help to reduce any bloated feeling in your abdomen.  Drink enough fluids to keep your urine clear or pale yellow.  You may gargle with salt water if you have a sore throat. SEEK IMMEDIATE MEDICAL CARE IF:  You have severe nausea or vomiting.  You have severe abdominal pain, abdominal cramps that last longer than 6 hours, or abdominal swelling (distention).  You have severe shoulder or back pain.  You have trouble swallowing.  You have shortness of breath, your breathing is shallow, or you are breathing faster than normal.  You have a fever or a rapid heartbeat.  You vomit blood or material that looks like coffee grounds.  You have bloody, black, or tarry  stools. MAKE SURE YOU:  Understand these instructions.  Will watch your condition.  Will get help right away if you are not doing well or get worse. Document Released: 11/09/2003 Document Revised: 08/11/2013 Document Reviewed: 06/27/2011 [facility] Patient Information 2015 [place], [place]. This information is not intended to replace advice given to you by your health care provider. Make sure you discuss any questions you have with your health care provider.

## 2014-04-22 NOTE — H&P (Signed)
Jacob Barnes is an 74 y.o. male.   Chief Complaint: Patient is here for EGD and possible EGD. HPI: Patient is 74 year old African-American male who presented with dysphagia with odynophagia and iron deficiency anemia back in October 2015. He underwent EGD. Was found to have esophageal stricture in the gastric ulcer. Biopsy revealed benign etiology and H. pylori stains were negative. He's been treated with PPI and feels much better. His H&H is coming up but does not normal. Since he had colonoscopy in May 2013 was not repeated. He states his swallowing is much better. He has occasional difficulty with solids. Patient has history of positive H. pylori serology but I believe he's been treated in the past by Dr. Moshe Barnes.  Past Medical History  Diagnosis Date  . Hypertension   . Diabetes mellitus     says since 1979  . Hyperlipemia   . Glaucoma     Past Surgical History  Procedure Laterality Date  . Appendectomy    . Cystoscopy/retrograde/ureteroscopy/stone extraction with basket    . Colonoscopy  09/06/2011    Procedure: COLONOSCOPY;  Surgeon: Jacob Houston, MD;  Location: [facility];  Service: Endoscopy;  Laterality: N/A;  830  . Esophagogastroduodenoscopy N/A 01/15/2014    Procedure: ESOPHAGOGASTRODUODENOSCOPY (EGD);  Surgeon: Jacob Houston, MD;  Location: [facility];  Service: Endoscopy;  Laterality: N/A;  200  . Maloney Barnes N/A 01/15/2014    Procedure: Jacob Jacob Barnes;  Surgeon: Jacob Houston, MD;  Location: [facility];  Service: Endoscopy;  Laterality: N/A;    Family History  Problem Relation Age of Onset  . Diabetes Mother   . Diabetes Father   . Heart disease Father   . Diabetes Brother   . Diabetes Brother    Social History:  reports that he has never smoked. He does not have any smokeless tobacco history on file. He reports that he does not drink alcohol or use illicit drugs.  Allergies: No Known Allergies  Medications Prior to Admission  Medication Sig  Dispense Refill  . benazepril (LOTENSIN) 10 MG tablet Take 1 tablet (10 mg total) by mouth daily. 90 tablet 1  . cholecalciferol (VITAMIN D) 1000 UNITS tablet Take 1,000 Units by mouth daily.    . ferrous sulfate 325 (65 FE) MG tablet Take 325 mg by mouth 2 (two) times daily with a meal.     . glipiZIDE (GLUCOTROL) 10 MG tablet Take 1 tablet (10 mg total) by mouth 2 (two) times daily. 180 tablet 1  . Insulin Glargine (LANTUS SOLOSTAR) 100 UNIT/ML Solostar Pen Inject 20 Units into the skin daily at 10 pm. 15 pen 1  . latanoprost (XALATAN) 0.005 % ophthalmic solution Place 1 drop into both eyes at bedtime.    . pantoprazole (PROTONIX) 40 MG tablet Take 1 tablet (40 mg total) by mouth 2 (two) times daily before a meal. 60 tablet 3  . pravastatin (PRAVACHOL) 10 MG tablet Take 1 tablet (10 mg total) by mouth at bedtime. 90 tablet 1  . ADVOCATE SAFETY LANCETS MISC For use three times daily testing dx. E10.65 300 each 3  . glucose blood (ADVOCATE TEST) test strip Use as instructed three times daily dx E10.65 100 each 12    Results for orders placed or performed during the hospital encounter of 04/22/14 (from the past 48 hour(s))  Glucose, capillary     Status: Abnormal   Collection Time: 04/22/14  1:42 PM  Result Value Ref Range   Glucose-Capillary 108 (  H) 70 - 99 mg/dL   No results found.  ROS  Blood pressure 133/76, pulse 68, temperature 97.9 F (36.6 C), temperature source Oral, resp. rate 17, SpO2 97 %. Physical Exam  Constitutional: He appears well-developed and well-nourished.  HENT:  Mouth/Throat: Oropharynx is clear and moist.  Patient is edentulous. He does have dentures.  Eyes: Conjunctivae are normal. No scleral icterus.  Neck: No thyromegaly present.  Cardiovascular: Normal rate, regular rhythm and normal heart sounds.   No murmur heard. Respiratory: Effort normal and breath sounds normal.  GI: Soft. He exhibits no distension and no mass. There is no tenderness.   Musculoskeletal: He exhibits no edema.  Lymphadenopathy:    He has no cervical adenopathy.  Neurological: He is alert.  Skin: Skin is warm and dry.     Assessment/Plan History of gastric ulcer and esophageal stricture. EGD with possible ED  JacobNAJEEB Barnes 04/22/2014, 2:30 PM

## 2014-04-22 NOTE — Op Note (Signed)
EGD PROCEDURE REPORT  PATIENT:  Jacob Barnes  MR#:  [id number] Birthdate:  01/04/1941, 74 y.o., male Endoscopist:  Dr. Rogene Houston, MD Referred By:  Dr. [name], MD  Procedure Date: 04/22/2014  Procedure:   EGD with ED  Indications:  Patient is 74 year old African-American male was evaluated back in October 2015 for dysphagia, odynophagia and iron deficiency anemia. He was found have gastric ulcer and distal esophageal stricture. Biopsy revealed benign etiology and H. pylori stains were negative. Patient has undergone therapy for H. pylori gastritis few years ago by Dr. [name].            Informed Consent:  The risks, benefits, alternatives & imponderables which include, but are not limited to, bleeding, infection, perforation, drug reaction and potential missed lesion have been reviewed.  The potential for biopsy, lesion removal, esophageal dilation, etc. have also been discussed.  Questions have been answered.  All parties agreeable.  Please see history & physical in medical record for more information.  Medications:  Demerol 25 mg IV Versed 2mg  IV Cetacaine spray topically for oropharyngeal anesthesia  Description of procedure:  The endoscope was introduced through the mouth and advanced to the second portion of the duodenum without difficulty or limitations. The mucosal surfaces were surveyed very carefully during advancement of the scope and upon withdrawal.  Findings:  Esophagus:  The cause of the esophagus was normal. Soft stricture noted at GE junction. GEJ:  40 cm Stomach:  Stomach was empty and distended very well with insufflation. Folds in the proximal stomach are normal. Examination of mucosa at gastric body and antrum was normal. Scar noted at angularis implying healed ulcer. Pyloric channel was patent. Fundus and cardia were unremarkable. Duodenum:  Normal bulbar and post bulbar mucosa.  Therapeutic/Diagnostic Maneuvers Performed:   Esophagus was dilated  by passing 54 [place] Maloney dilator to full insertion. As the dilator was withdrawn endoscope was passed again. Mucosal disruption noted at GE junction. Post dilation was able to pass the scope across the junction without any resistance.  Complications:  None  Impression: Soft stricture at GE junction was dilated with 54 [place] Maloney dilator. Gastric ulcer has completely healed.  Recommendations:  Decrease pantoprazole to 40 mg by mouth every morning. Will check H&H today.  [name],[name]  04/22/2014  2:57 PM  CC: Dr. [name], MD & Dr. Rayne Du ref. provider found

## 2014-04-23 ENCOUNTER — Encounter (HOSPITAL_COMMUNITY): Payer: Self-pay | Admitting: Internal Medicine

## 2014-06-04 ENCOUNTER — Encounter (INDEPENDENT_AMBULATORY_CARE_PROVIDER_SITE_OTHER): Payer: Medicare HMO | Admitting: Ophthalmology

## 2014-06-04 DIAGNOSIS — I10 Essential (primary) hypertension: Secondary | ICD-10-CM

## 2014-06-04 DIAGNOSIS — E11351 Type 2 diabetes mellitus with proliferative diabetic retinopathy with macular edema: Secondary | ICD-10-CM

## 2014-06-04 DIAGNOSIS — E11311 Type 2 diabetes mellitus with unspecified diabetic retinopathy with macular edema: Secondary | ICD-10-CM

## 2014-06-04 DIAGNOSIS — H43813 Vitreous degeneration, bilateral: Secondary | ICD-10-CM

## 2014-06-04 DIAGNOSIS — H35033 Hypertensive retinopathy, bilateral: Secondary | ICD-10-CM

## 2014-06-18 ENCOUNTER — Ambulatory Visit: Payer: PRIVATE HEALTH INSURANCE | Admitting: Family Medicine

## 2014-06-22 ENCOUNTER — Other Ambulatory Visit: Payer: Self-pay | Admitting: Family Medicine

## 2014-06-23 LAB — COMPLETE METABOLIC PANEL WITH GFR
ALT: 12 U/L (ref 0–53)
AST: 22 U/L (ref 0–37)
Albumin: 3.4 g/dL — ABNORMAL LOW (ref 3.5–5.2)
Alkaline Phosphatase: 100 U/L (ref 39–117)
BUN: 18 mg/dL (ref 6–23)
CO2: 31 mEq/L (ref 19–32)
Calcium: 9 mg/dL (ref 8.4–10.5)
Chloride: 103 mEq/L (ref 96–112)
Creat: 1.2 mg/dL (ref 0.50–1.35)
GFR, EST NON AFRICAN AMERICAN: 59 mL/min — AB
GFR, Est African American: 68 mL/min
GLUCOSE: 86 mg/dL (ref 70–99)
Potassium: 4.7 mEq/L (ref 3.5–5.3)
Sodium: 139 mEq/L (ref 135–145)
Total Bilirubin: 0.3 mg/dL (ref 0.2–1.2)
Total Protein: 7.4 g/dL (ref 6.0–8.3)

## 2014-06-23 LAB — LIPID PANEL
Cholesterol: 98 mg/dL (ref 0–200)
HDL: 43 mg/dL (ref >=40)
LDL Cholesterol: 47 mg/dL (ref 0–99)
TRIGLYCERIDES: 39 mg/dL (ref <150)
Total CHOL/HDL Ratio: 2.3 Ratio
VLDL: 8 mg/dL (ref 0–40)

## 2014-06-23 LAB — HEMOGLOBIN A1C
Hgb A1c MFr Bld: 9.3 % — ABNORMAL HIGH (ref <5.7)
Mean Plasma Glucose: 220 mg/dL — ABNORMAL HIGH (ref <117)

## 2014-07-01 ENCOUNTER — Ambulatory Visit (INDEPENDENT_AMBULATORY_CARE_PROVIDER_SITE_OTHER): Payer: Medicare PPO | Admitting: Family Medicine

## 2014-07-01 ENCOUNTER — Encounter: Payer: Self-pay | Admitting: Family Medicine

## 2014-07-01 VITALS — BP 128/62 | HR 83 | Resp 16 | Ht 65.0 in | Wt 138.0 lb

## 2014-07-01 DIAGNOSIS — R1310 Dysphagia, unspecified: Secondary | ICD-10-CM

## 2014-07-01 DIAGNOSIS — K219 Gastro-esophageal reflux disease without esophagitis: Secondary | ICD-10-CM

## 2014-07-01 DIAGNOSIS — E1065 Type 1 diabetes mellitus with hyperglycemia: Secondary | ICD-10-CM

## 2014-07-01 DIAGNOSIS — I10 Essential (primary) hypertension: Secondary | ICD-10-CM

## 2014-07-01 DIAGNOSIS — E785 Hyperlipidemia, unspecified: Secondary | ICD-10-CM

## 2014-07-01 DIAGNOSIS — IMO0001 Reserved for inherently not codable concepts without codable children: Secondary | ICD-10-CM

## 2014-07-01 DIAGNOSIS — D509 Iron deficiency anemia, unspecified: Secondary | ICD-10-CM

## 2014-07-01 DIAGNOSIS — Z794 Long term (current) use of insulin: Secondary | ICD-10-CM

## 2014-07-01 DIAGNOSIS — N183 Chronic kidney disease, stage 3 unspecified: Secondary | ICD-10-CM

## 2014-07-01 DIAGNOSIS — E1165 Type 2 diabetes mellitus with hyperglycemia: Secondary | ICD-10-CM

## 2014-07-01 MED ORDER — BENAZEPRIL HCL 10 MG PO TABS: 10.0000 mg | ORAL_TABLET | Freq: Every day | ORAL | Status: DC

## 2014-07-01 MED ORDER — GLIPIZIDE 10 MG PO TABS: 10.0000 mg | ORAL_TABLET | Freq: Two times a day (BID) | ORAL | Status: DC

## 2014-07-01 NOTE — Patient Instructions (Addendum)
Annual wellness in 4.5 month, call if you need me before  You wil get a soonest availbal appt to the diabetes Doc, Nida, I am really thankful that you have agreed to do this, it is for your BEST interest.  Cholesterol and blood pressure are excellent

## 2014-07-16 ENCOUNTER — Encounter (INDEPENDENT_AMBULATORY_CARE_PROVIDER_SITE_OTHER): Payer: Medicare PPO | Admitting: Ophthalmology

## 2014-07-16 DIAGNOSIS — E11351 Type 2 diabetes mellitus with proliferative diabetic retinopathy with macular edema: Secondary | ICD-10-CM | POA: Diagnosis not present

## 2014-07-16 DIAGNOSIS — H35033 Hypertensive retinopathy, bilateral: Secondary | ICD-10-CM | POA: Diagnosis not present

## 2014-07-16 DIAGNOSIS — H43813 Vitreous degeneration, bilateral: Secondary | ICD-10-CM | POA: Diagnosis not present

## 2014-07-16 DIAGNOSIS — E11311 Type 2 diabetes mellitus with unspecified diabetic retinopathy with macular edema: Secondary | ICD-10-CM

## 2014-07-16 DIAGNOSIS — I10 Essential (primary) hypertension: Secondary | ICD-10-CM

## 2014-07-24 ENCOUNTER — Other Ambulatory Visit: Payer: Self-pay | Admitting: Family Medicine

## 2014-08-13 ENCOUNTER — Encounter (INDEPENDENT_AMBULATORY_CARE_PROVIDER_SITE_OTHER): Payer: Medicare PPO | Admitting: Ophthalmology

## 2014-08-13 DIAGNOSIS — I10 Essential (primary) hypertension: Secondary | ICD-10-CM | POA: Diagnosis not present

## 2014-08-13 DIAGNOSIS — H43813 Vitreous degeneration, bilateral: Secondary | ICD-10-CM | POA: Diagnosis not present

## 2014-08-13 DIAGNOSIS — E10351 Type 1 diabetes mellitus with proliferative diabetic retinopathy with macular edema: Secondary | ICD-10-CM | POA: Diagnosis not present

## 2014-08-13 DIAGNOSIS — H35033 Hypertensive retinopathy, bilateral: Secondary | ICD-10-CM

## 2014-08-13 DIAGNOSIS — E10311 Type 1 diabetes mellitus with unspecified diabetic retinopathy with macular edema: Secondary | ICD-10-CM | POA: Diagnosis not present

## 2014-08-27 ENCOUNTER — Ambulatory Visit (INDEPENDENT_AMBULATORY_CARE_PROVIDER_SITE_OTHER): Payer: Medicare PPO | Admitting: Ophthalmology

## 2014-08-27 DIAGNOSIS — E11311 Type 2 diabetes mellitus with unspecified diabetic retinopathy with macular edema: Secondary | ICD-10-CM

## 2014-08-27 DIAGNOSIS — E11351 Type 2 diabetes mellitus with proliferative diabetic retinopathy with macular edema: Secondary | ICD-10-CM | POA: Diagnosis not present

## 2014-09-03 ENCOUNTER — Other Ambulatory Visit (INDEPENDENT_AMBULATORY_CARE_PROVIDER_SITE_OTHER): Payer: Medicare PPO | Admitting: Ophthalmology

## 2014-09-03 DIAGNOSIS — E11311 Type 2 diabetes mellitus with unspecified diabetic retinopathy with macular edema: Secondary | ICD-10-CM

## 2014-09-03 DIAGNOSIS — E11351 Type 2 diabetes mellitus with proliferative diabetic retinopathy with macular edema: Secondary | ICD-10-CM

## 2014-09-17 NOTE — Assessment & Plan Note (Signed)
Hyperlipidemia:Low fat diet discussed and encouraged. Controlled, no change in medication    Lipid Panel  Lab Results  Component Value Date   CHOL 98 06/22/2014   HDL 43 06/22/2014   LDLCALC 47 06/22/2014   TRIG 39 06/22/2014   CHOLHDL 2.3 06/22/2014

## 2014-09-17 NOTE — Assessment & Plan Note (Signed)
Deteriorated, pt is now in agreement with being treated by endocrinologist , finally His sister accompanies him and will continue to assist with this

## 2014-09-17 NOTE — Assessment & Plan Note (Signed)
Slight improvement since last checked, importance of diabetes control is stressed and med compliance

## 2014-09-17 NOTE — Progress Notes (Signed)
   Subjective:    Patient ID: Jacob Barnes, male    DOB: [birth year], 74 y.o.   MRN: [id number]  HPI The PT is here for follow up and re-evaluation of chronic medical conditions, medication management and review of any available recent lab and radiology data.  Preventive health is updated, specifically  Cancer screening and Immunization.    The PT denies any adverse reactions to current medications since the last visit.  On review of worsened blood sugar , pt now agrees to have endo treat his diabetes which is excellent       Review of Systems See HPI Denies recent fever or chills. Denies sinus pressure, nasal congestion, ear pain or sore throat. Denies chest congestion, productive cough or wheezing. Denies chest pains, palpitations and leg swelling Denies abdominal pain, nausea, vomiting,diarrhea or constipation.   Denies dysuria, frequency, hesitancy or incontinence. Denies joint pain, swelling and limitation in mobility. Denies headaches, seizures, numbness, or tingling. Denies depression, anxiety or insomnia. Denies skin break down or rash.        Objective:   Physical Exam BP 128/62 mmHg  Pulse 83  Resp 16  Ht 5\' 5"  (1.651 m)  Wt 138 lb (62.596 kg)  BMI 22.96 kg/m2  SpO2 100% Patient alert and oriented and in no cardiopulmonary distress.  HEENT: No facial asymmetry, EOMI,   oropharynx pink and moist.  Neck supple no JVD, no mass.  Chest: Clear to auscultation bilaterally.  CVS: S1, S2 no murmurs, no S3.Regular rate.  ABD: Soft non tender.   Ext: No edema  MS: Adequate ROM spine, shoulders, hips and knees.  Skin: Intact, no ulcerations or rash noted.  Psych: Good eye contact, normal affect. Memory intact not anxious or depressed appearing.  CNS: CN 2-12 intact, power,  normal throughout.no focal deficits noted.       Assessment & Plan:  Essential hypertension Controlled, no change in medication DASH diet and commitment to daily physical activity  for a minimum of 30 minutes discussed and encouraged, as a part of hypertension management. The importance of attaining a healthy weight is also discussed.  BP/Weight 07/01/2014 04/22/2014 04/16/2014 03/17/2014 01/30/2014 01/15/2014 38/04/173  Systolic BP 102 585 277 824 235 361 443  Diastolic BP 62 66 50 80 72 66 42  Wt. (Lbs) 138 - 139.5 137 139 139 139.7  BMI 22.96 - 23.21 22.8 23.13 23.13 23.25        Diabetes mellitus, insulin dependent (IDDM), uncontrolled Deteriorated, pt is now in agreement with being treated by endocrinologist , finally His sister accompanies him and will continue to assist with this  Hyperlipidemia Hyperlipidemia:Low fat diet discussed and encouraged. Controlled, no change in medication    Lipid Panel  Lab Results  Component Value Date   CHOL 98 06/22/2014   HDL 43 06/22/2014   LDLCALC 47 06/22/2014   TRIG 39 06/22/2014   CHOLHDL 2.3 06/22/2014        GERD (gastroesophageal reflux disease) Significant dyspepsia and difficulty swallowing , GI involved in care  CKD (chronic kidney disease) stage 3, GFR 30-59 ml/min Slight improvement since last checked, importance of diabetes control is stressed and med compliance

## 2014-09-17 NOTE — Assessment & Plan Note (Signed)
Significant dyspepsia and difficulty swallowing , GI involved in care

## 2014-09-17 NOTE — Assessment & Plan Note (Signed)
Controlled, no change in medication DASH diet and commitment to daily physical activity for a minimum of 30 minutes discussed and encouraged, as a part of hypertension management. The importance of attaining a healthy weight is also discussed.  BP/Weight 07/01/2014 04/22/2014 04/16/2014 03/17/2014 01/30/2014 01/15/2014 51/11/3435  Systolic BP 357 897 847 841 282 081 388  Diastolic BP 62 66 50 80 72 66 42  Wt. (Lbs) 138 - 139.5 137 139 139 139.7  BMI 22.96 - 23.21 22.8 23.13 23.13 23.25

## 2014-09-24 ENCOUNTER — Encounter (INDEPENDENT_AMBULATORY_CARE_PROVIDER_SITE_OTHER): Payer: Medicare PPO | Admitting: Ophthalmology

## 2014-09-24 DIAGNOSIS — H35033 Hypertensive retinopathy, bilateral: Secondary | ICD-10-CM | POA: Diagnosis not present

## 2014-09-24 DIAGNOSIS — E11351 Type 2 diabetes mellitus with proliferative diabetic retinopathy with macular edema: Secondary | ICD-10-CM | POA: Diagnosis not present

## 2014-09-24 DIAGNOSIS — H43813 Vitreous degeneration, bilateral: Secondary | ICD-10-CM | POA: Diagnosis not present

## 2014-09-24 DIAGNOSIS — I10 Essential (primary) hypertension: Secondary | ICD-10-CM

## 2014-09-24 DIAGNOSIS — E11311 Type 2 diabetes mellitus with unspecified diabetic retinopathy with macular edema: Secondary | ICD-10-CM | POA: Diagnosis not present

## 2014-10-22 ENCOUNTER — Encounter (INDEPENDENT_AMBULATORY_CARE_PROVIDER_SITE_OTHER): Payer: Medicare PPO | Admitting: Ophthalmology

## 2014-10-22 DIAGNOSIS — E11351 Type 2 diabetes mellitus with proliferative diabetic retinopathy with macular edema: Secondary | ICD-10-CM | POA: Diagnosis not present

## 2014-10-22 DIAGNOSIS — H43813 Vitreous degeneration, bilateral: Secondary | ICD-10-CM | POA: Diagnosis not present

## 2014-10-22 DIAGNOSIS — I10 Essential (primary) hypertension: Secondary | ICD-10-CM

## 2014-10-22 DIAGNOSIS — H35033 Hypertensive retinopathy, bilateral: Secondary | ICD-10-CM

## 2014-10-22 DIAGNOSIS — E11311 Type 2 diabetes mellitus with unspecified diabetic retinopathy with macular edema: Secondary | ICD-10-CM | POA: Diagnosis not present

## 2014-11-06 ENCOUNTER — Telehealth: Payer: Self-pay

## 2014-11-06 ENCOUNTER — Other Ambulatory Visit: Payer: Self-pay

## 2014-11-06 ENCOUNTER — Other Ambulatory Visit: Payer: Self-pay | Admitting: Family Medicine

## 2014-11-06 DIAGNOSIS — Z794 Long term (current) use of insulin: Principal | ICD-10-CM

## 2014-11-06 DIAGNOSIS — I10 Essential (primary) hypertension: Secondary | ICD-10-CM

## 2014-11-06 DIAGNOSIS — IMO0001 Reserved for inherently not codable concepts without codable children: Secondary | ICD-10-CM

## 2014-11-06 DIAGNOSIS — E1165 Type 2 diabetes mellitus with hyperglycemia: Principal | ICD-10-CM

## 2014-11-06 DIAGNOSIS — E785 Hyperlipidemia, unspecified: Secondary | ICD-10-CM

## 2014-11-06 NOTE — Telephone Encounter (Signed)
Labs ordered and will do tomorrow

## 2014-11-07 LAB — COMPLETE METABOLIC PANEL WITH GFR
ALT: 14 U/L (ref 9–46)
AST: 24 U/L (ref 10–35)
Albumin: 3.6 g/dL (ref 3.6–5.1)
Alkaline Phosphatase: 82 U/L (ref 40–115)
BUN: 16 mg/dL (ref 7–25)
CALCIUM: 8.7 mg/dL (ref 8.6–10.3)
CHLORIDE: 104 mmol/L (ref 98–110)
CO2: 30 mmol/L (ref 20–31)
Creat: 1.32 mg/dL — ABNORMAL HIGH (ref 0.70–1.18)
GFR, EST AFRICAN AMERICAN: 61 mL/min (ref >=60)
GFR, EST NON AFRICAN AMERICAN: 52 mL/min — AB (ref >=60)
Glucose, Bld: 74 mg/dL (ref 65–99)
Potassium: 3.9 mmol/L (ref 3.5–5.3)
Sodium: 142 mmol/L (ref 135–146)
Total Bilirubin: 0.3 mg/dL (ref 0.2–1.2)
Total Protein: 7.4 g/dL (ref 6.1–8.1)

## 2014-11-07 LAB — LIPID PANEL
CHOL/HDL RATIO: 2.1 ratio (ref <=5.0)
Cholesterol: 97 mg/dL — ABNORMAL LOW (ref 125–200)
HDL: 46 mg/dL (ref >=40)
LDL CALC: 43 mg/dL (ref <130)
Triglycerides: 41 mg/dL (ref <150)
VLDL: 8 mg/dL (ref <30)

## 2014-11-07 IMAGING — CR DG CHEST 1V PORT
1 series · 1 of 1 positions shown · non-contrast
Comparison: 08/23/2006 ribs series

CLINICAL DATA: Upper abdominal pain, generalized weakness.
Hypertension.

EXAM:
PORTABLE CHEST - 1 VIEW

[portable]
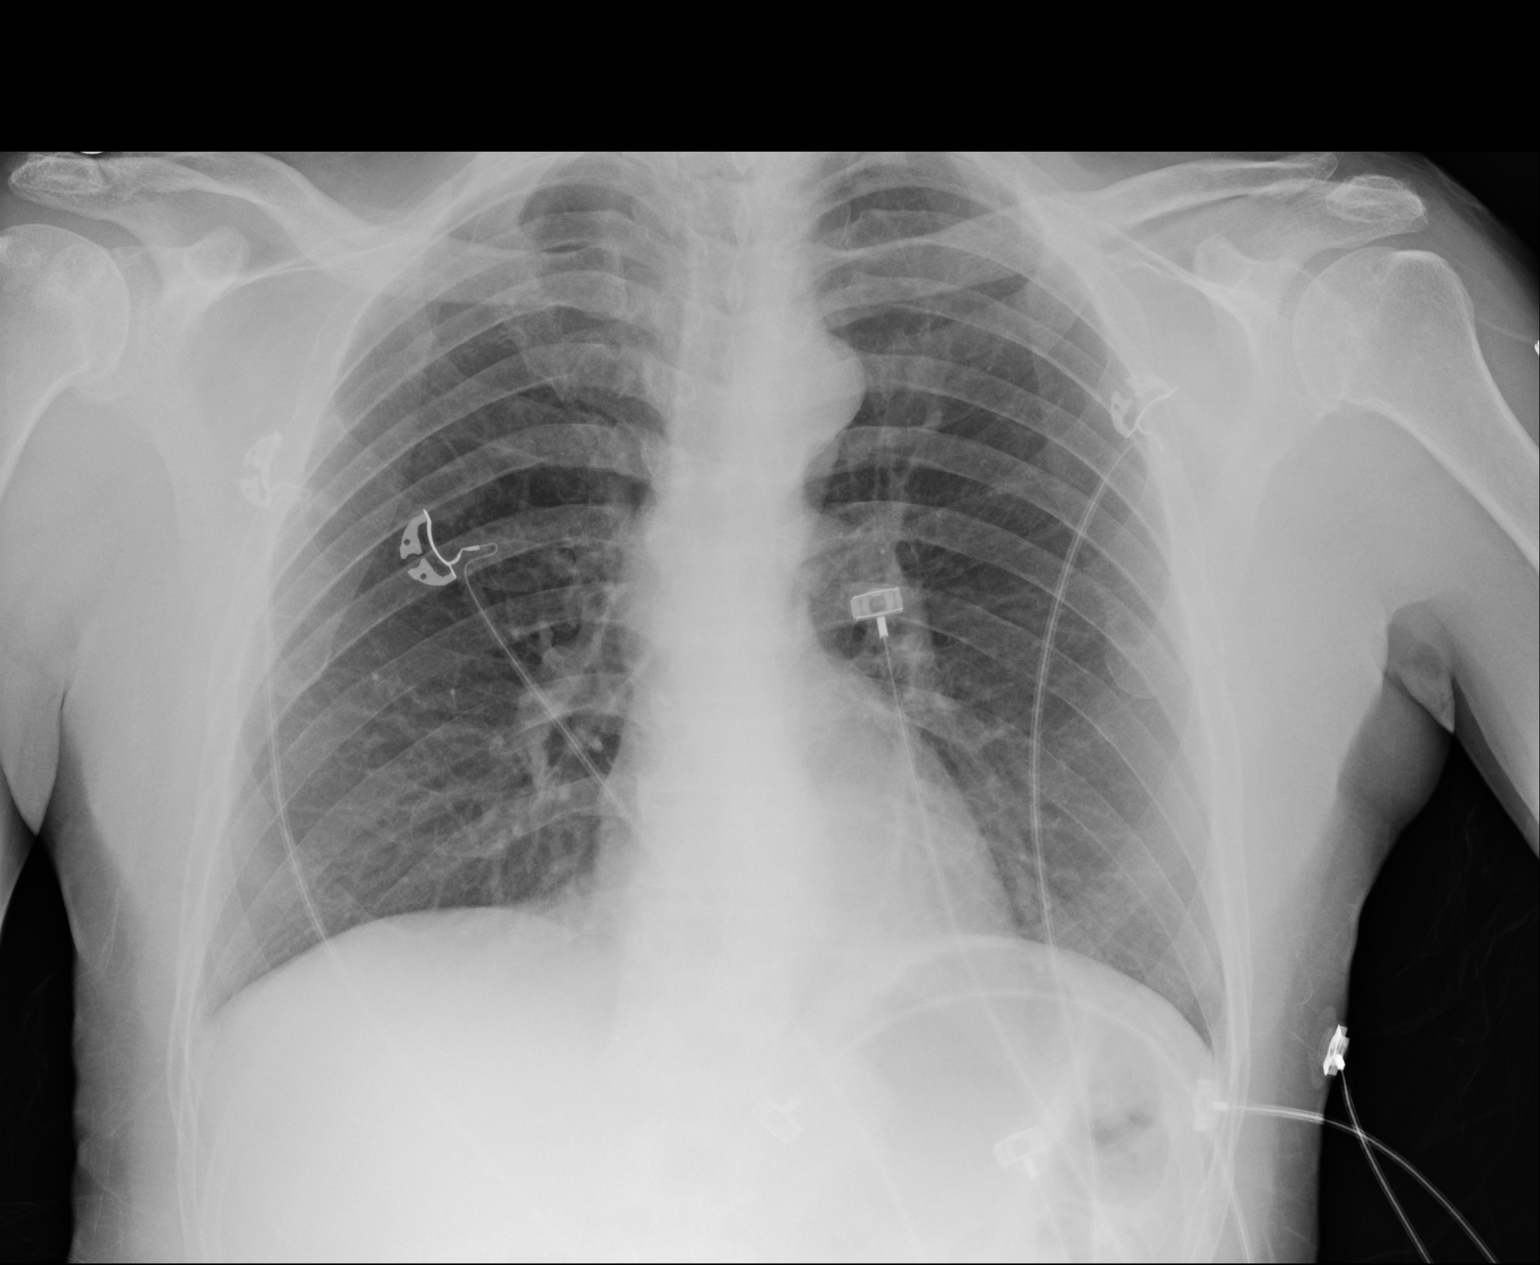

[1 of 1 positions shown; findings below may reference images not displayed]

FINDINGS: Heart size and mediastinal contours within normal range. Lungs are
predominantly clear. No pleural effusion or pneumothorax. There may
be mild right shoulder degenerative change. No acute osseous
finding.
IMPRESSION: No radiographic evidence of active cardiopulmonary disease.

## 2014-11-08 LAB — HEMOGLOBIN A1C
Hgb A1c MFr Bld: 9.2 % — ABNORMAL HIGH (ref <5.7)
Mean Plasma Glucose: 217 mg/dL — ABNORMAL HIGH (ref <117)

## 2014-11-11 ENCOUNTER — Ambulatory Visit (INDEPENDENT_AMBULATORY_CARE_PROVIDER_SITE_OTHER): Payer: Medicare PPO | Admitting: Family Medicine

## 2014-11-11 ENCOUNTER — Encounter: Payer: Self-pay | Admitting: Family Medicine

## 2014-11-11 VITALS — BP 110/70 | HR 69 | Resp 16 | Ht 65.0 in | Wt 130.6 lb

## 2014-11-11 DIAGNOSIS — IMO0001 Reserved for inherently not codable concepts without codable children: Secondary | ICD-10-CM

## 2014-11-11 DIAGNOSIS — Z794 Long term (current) use of insulin: Secondary | ICD-10-CM

## 2014-11-11 DIAGNOSIS — E1065 Type 1 diabetes mellitus with hyperglycemia: Secondary | ICD-10-CM

## 2014-11-11 DIAGNOSIS — E1165 Type 2 diabetes mellitus with hyperglycemia: Secondary | ICD-10-CM

## 2014-11-11 DIAGNOSIS — Z0000 Encounter for general adult medical examination without abnormal findings: Secondary | ICD-10-CM | POA: Insufficient documentation

## 2014-11-11 NOTE — Patient Instructions (Signed)
F/u in 4 mmonth, call if you need me before pleae  Use wax softener in left ear, there is a little build up there, no Qtips  Pls schedule sooner appt with Dr Dorris Fetch since blood sugar too high and try and get appt with the diabetic educator for help also please   Medications are being sent to pharmacy you choose  Since you do not feel well, important to check feet every day  Thanks for choosing [facility], we consider it a privelige to serve you.

## 2014-11-11 NOTE — Progress Notes (Signed)
Subjective:    Patient ID: Jacob Barnes, male    DOB: April 08, 1941, 74 y.o.   MRN: [id number]  HPI Preventive Screening-Counseling & Management   Patient present here today for a Medicare annual wellness visit.   Current Problems (verified)   Medications Prior to Visit Allergies (verified)   PAST HISTORY  Family History (verified)  Social History Lives with his daughte, never smoker. Retired at age 22 from [employer]   Risk Factors  Current exercise habits:  Push mows grass   Dietary issues discussed: low carb and low fat fiet. Increasing vegetable intake    Cardiac risk factors: IDDM, father heart disease  Depression Screen  (Note: if answer to either of the following is "Yes", a more complete depression screening is indicated)   Over the past two weeks, have you felt down, depressed or hopeless? No  Over the past two weeks, have you felt little interest or pleasure in doing things? No  Have you lost interest or pleasure in daily life? No  Do you often feel hopeless? No  Do you cry easily over simple problems? No   Activities of Daily Living  In your present state of health, do you have any difficulty performing the following activities?  Driving?: No Managing money?: No Feeding yourself?:No Getting from bed to chair?:No Climbing a flight of stairs?:No Preparing food and eating?:No Bathing or showering?:No Getting dressed?:No Getting to the toilet?:No Using the toilet?:No Moving around from place to place?: No  Fall Risk Assessment In the past year have you fallen or had a near fall?:No Are you currently taking any medications that make you dizzy?:No   Hearing Difficulties: No Do you often ask people to speak up or repeat themselves?:No Do you experience ringing or noises in your ears?:No Do you have difficulty understanding soft or whispered voices?:No  Cognitive Testing  Alert? Yes Normal Appearance?Yes  Oriented to person? Yes Place? Yes  Time? Yes    Displays appropriate judgment?Yes  Can read the correct time from a watch face? yes Are you having problems remembering things?No  Advanced Directives have been discussed with the patient?Yes. No current living will    List the Names of Other Physician/Practitioners you currently use:  Dr Dorris Fetch (endo) Dr Lowanda Foster (neph) Dr Zigmund Daniel (opthl)  Indicate any recent Medical Services you may have received from other than [facility] providers in the past year (date may be approximate).   Assessment:    Annual Wellness Exam   Plan:    .  Medicare Attestation  I have personally reviewed:  The patient's medical and social history  Their use of alcohol, tobacco or illicit drugs  Their current medications and supplements  The patient's functional ability including ADLs,fall risks, home safety risks, cognitive, and hearing and visual impairment  Diet and physical activities  Evidence for depression or mood disorders  The patient's weight, height, BMI, and visual acuity have been recorded in the chart. I have made referrals, counseling, and provided education to the patient based on review of the above and I have provided the patient with a written personalized care plan for preventive services.      Review of Systems     Objective:   Physical Exam  BP 110/70 mmHg  Pulse 69  Resp 16  Ht 5\' 5"  (1.651 m)  Wt 130 lb 9.6 oz (59.24 kg)  BMI 21.73 kg/m2  SpO2 100%       Assessment & Plan:  Medicare annual wellness visit, subsequent Annual  exam as documented. Counseling done  re healthy lifestyle involving commitment to 150 minutes exercise per week, heart healthy diet, and attaining healthy weight.The importance of adequate sleep also discussed. Regular seat belt use and home safety, is also discussed. Changes in health habits are decided on by the patient with goals and time frames  set for achieving them. Immunization and cancer screening needs are specifically addressed at this  visit.

## 2014-11-12 LAB — PSA: PSA: 1 ng/mL (ref <=4.00)

## 2014-11-13 ENCOUNTER — Other Ambulatory Visit: Payer: Self-pay | Admitting: Family Medicine

## 2014-11-13 LAB — MICROALBUMIN / CREATININE URINE RATIO
CREATININE, URINE: 189.4 mg/dL
MICROALB UR: 13.7 mg/dL — AB (ref <2.0)
MICROALB/CREAT RATIO: 72.3 mg/g — AB (ref 0.0–30.0)

## 2014-11-16 ENCOUNTER — Other Ambulatory Visit: Payer: Self-pay

## 2014-11-16 MED ORDER — BENAZEPRIL HCL 5 MG PO TABS: 5.0000 mg | ORAL_TABLET | Freq: Every day | ORAL | Status: DC

## 2014-11-17 ENCOUNTER — Other Ambulatory Visit: Payer: Self-pay

## 2014-11-17 MED ORDER — BENAZEPRIL HCL 5 MG PO TABS: 5.0000 mg | ORAL_TABLET | Freq: Every day | ORAL | Status: DC

## 2014-11-21 ENCOUNTER — Encounter: Payer: Self-pay | Admitting: Family Medicine

## 2014-11-21 NOTE — Assessment & Plan Note (Signed)

## 2014-12-17 ENCOUNTER — Encounter (INDEPENDENT_AMBULATORY_CARE_PROVIDER_SITE_OTHER): Payer: Medicare PPO | Admitting: Ophthalmology

## 2014-12-17 ENCOUNTER — Telehealth: Payer: Self-pay | Admitting: *Deleted

## 2014-12-17 DIAGNOSIS — H43813 Vitreous degeneration, bilateral: Secondary | ICD-10-CM | POA: Diagnosis not present

## 2014-12-17 DIAGNOSIS — I10 Essential (primary) hypertension: Secondary | ICD-10-CM | POA: Diagnosis not present

## 2014-12-17 DIAGNOSIS — K219 Gastro-esophageal reflux disease without esophagitis: Secondary | ICD-10-CM

## 2014-12-17 DIAGNOSIS — E11351 Type 2 diabetes mellitus with proliferative diabetic retinopathy with macular edema: Secondary | ICD-10-CM | POA: Diagnosis not present

## 2014-12-17 DIAGNOSIS — H35033 Hypertensive retinopathy, bilateral: Secondary | ICD-10-CM | POA: Diagnosis not present

## 2014-12-17 MED ORDER — PANTOPRAZOLE SODIUM 40 MG PO TBEC: 40.0000 mg | DELAYED_RELEASE_TABLET | Freq: Every day | ORAL | Status: DC

## 2014-12-17 MED ORDER — PRAVASTATIN SODIUM 10 MG PO TABS: 10.0000 mg | ORAL_TABLET | Freq: Every day | ORAL | Status: DC

## 2014-12-17 MED ORDER — BENAZEPRIL HCL 5 MG PO TABS: 5.0000 mg | ORAL_TABLET | Freq: Every day | ORAL | Status: DC

## 2014-12-17 NOTE — Telephone Encounter (Signed)
meds reilled

## 2014-12-17 NOTE — Telephone Encounter (Signed)
Devern called for pt stating Jacob Barnes has been out of medications for a week and he needs a refill on all of them and Devern is requesting a call back [phone]

## 2014-12-28 ENCOUNTER — Other Ambulatory Visit: Payer: Self-pay

## 2015-01-04 ENCOUNTER — Telehealth: Payer: Self-pay | Admitting: Family Medicine

## 2015-01-04 NOTE — Telephone Encounter (Signed)
Please call [employer] regarding medication, they have questions before refilling please advise?

## 2015-01-04 NOTE — Telephone Encounter (Signed)
Sister aware that [facility] will be sending out his meds today

## 2015-01-07 ENCOUNTER — Telehealth: Payer: Self-pay | Admitting: *Deleted

## 2015-01-07 ENCOUNTER — Other Ambulatory Visit: Payer: Self-pay

## 2015-01-07 MED ORDER — GLUCOSE BLOOD VI STRP: ORAL_STRIP | Status: DC

## 2015-01-07 MED ORDER — ADVOCATE SAFETY LANCETS MISC: Status: DC

## 2015-01-07 NOTE — Telephone Encounter (Signed)
Patient's wife called wanting to speak with Loma Sousa again about patient. [phone]

## 2015-01-08 NOTE — Telephone Encounter (Signed)
Strips sent to [facility]

## 2015-03-18 ENCOUNTER — Ambulatory Visit: Payer: Medicare PPO | Admitting: Family Medicine

## 2015-03-18 ENCOUNTER — Encounter: Payer: Self-pay | Admitting: Family Medicine

## 2015-03-18 ENCOUNTER — Ambulatory Visit (INDEPENDENT_AMBULATORY_CARE_PROVIDER_SITE_OTHER): Payer: Medicare PPO | Admitting: Family Medicine

## 2015-03-18 VITALS — BP 132/74 | HR 72 | Resp 18 | Ht 65.0 in | Wt 131.0 lb

## 2015-03-18 DIAGNOSIS — Z23 Encounter for immunization: Secondary | ICD-10-CM | POA: Diagnosis not present

## 2015-03-18 DIAGNOSIS — I10 Essential (primary) hypertension: Secondary | ICD-10-CM

## 2015-03-18 DIAGNOSIS — E785 Hyperlipidemia, unspecified: Secondary | ICD-10-CM | POA: Diagnosis not present

## 2015-03-18 DIAGNOSIS — E1065 Type 1 diabetes mellitus with hyperglycemia: Secondary | ICD-10-CM

## 2015-03-18 DIAGNOSIS — E1069 Type 1 diabetes mellitus with other specified complication: Secondary | ICD-10-CM

## 2015-03-18 DIAGNOSIS — K219 Gastro-esophageal reflux disease without esophagitis: Secondary | ICD-10-CM

## 2015-03-18 DIAGNOSIS — IMO0002 Reserved for concepts with insufficient information to code with codable children: Secondary | ICD-10-CM

## 2015-03-18 NOTE — Patient Instructions (Addendum)
Annual physical exam in 4 monht, call if you need me before  Flu vaccine today  HBa1C, cmp , TSH  Today  All the best for 2017!  Thanks for choosing [facility], we consider it a privelige to serve you.

## 2015-03-18 NOTE — Progress Notes (Signed)
Subjective:    Patient ID: Jacob Barnes, male    DOB: 1941-03-02, 74 y.o.   MRN: [id number]  HPI   Jacob Barnes     MRN: [id number]      DOB: [birth year]   HPI [name] is here for follow up and re-evaluation of chronic medical conditions, medication management and review of any available recent lab and radiology data.  Preventive health is updated, specifically  Cancer screening and Immunization.   Questions or concerns regarding consultations or procedures which the PT has had in the interim are  Addressed.Neess to return to endo re uncontrolled diabetes, though he feels as though his sugar is OK The PT denies any adverse reactions to current medications since the last visit.  Denies polyuria, polydipsia, blurred vision , or hypoglycemic episodes. Not testing regularly   ROS Denies recent fever or chills. Denies sinus pressure, nasal congestion, ear pain or sore throat. Denies chest congestion, productive cough or wheezing. Denies chest pains, palpitations and leg swelling Denies abdominal pain, nausea, vomiting,diarrhea or constipation.   Denies dysuria, frequency, hesitancy or incontinence. Denies joint pain, swelling and limitation in mobility. Denies headaches, seizures, numbness, or tingling. Denies depression, anxiety or insomnia. Denies skin break down or rash.   PE  BP 132/74 mmHg  Pulse 72  Resp 18  Ht 5\' 5"  (1.651 m)  Wt 131 lb 0.6 oz (59.439 kg)  BMI 21.81 kg/m2  SpO2 99%  Patient alert and oriented and in no cardiopulmonary distress.  HEENT: No facial asymmetry, EOMI,   oropharynx pink and moist.  Neck supple no JVD, no mass.  Chest: Clear to auscultation bilaterally.  CVS: S1, S2 no murmurs, no S3.Regular rate.  ABD: Soft non tender.   Ext: No edema  MS: Adequate ROM spine, shoulders, hips and knees.  Skin: Intact, no ulcerations or rash noted.  Psych: Good eye contact, normal affect. Memory intact not anxious or depressed appearing.  CNS: CN  2-12 intact, power,  normal throughout.no focal deficits noted.   Assessment & Plan   Essential hypertension Controlled, no change in medication DASH diet and commitment to daily physical activity for a minimum of 30 minutes discussed and encouraged, as a part of hypertension management. The importance of attaining a healthy weight is also discussed.  BP/Weight 03/18/2015 11/11/2014 07/01/2014 04/22/2014 04/16/2014 03/17/2014 [id number]  Systolic BP Q000111Q A999333 0000000 [id number] 0000000  Diastolic BP 74 70 62 66 50 80 72  Wt. (Lbs) 131.04 130.6 138 - 139.5 137 139  BMI 21.81 21.73 22.96 - 23.21 22.8 23.13        Diabetes mellitus, insulin dependent (IDDM), uncontrolled Deteriorated , needs to f/u with endo and comply with treatment plan Jacob Barnes is reminded of the importance of commitment to daily physical activity for 30 minutes or more, as able and the need to limit carbohydrate intake to 30 to 60 grams per meal to help with blood sugar control.   The need to take medication as prescribed, test blood sugar as directed, and to call between visits if there is a concern that blood sugar is uncontrolled is also discussed.   Jacob Barnes is reminded of the importance of daily foot exam, annual eye examination, and good blood sugar, blood pressure and cholesterol control.  Diabetic Labs Latest Ref Rng 03/18/2015 11/11/2014 11/06/2014 06/22/2014 03/11/2014  HbA1c <5.7 % 9.3(H) - 9.2(H) 9.3(H) 8.9(H)  Microalbumin <2.0 mg/dL - 13.7(H) - - -  Micro/Creat Ratio 0.0 - 30.0 mg/g -  72.3(H) - - -  Chol 125 - 200 mg/dL - - 97(L) 98 -  HDL >=40 mg/dL - - 46 43 -  Calc LDL <130 mg/dL - - 43 47 -  Triglycerides <150 mg/dL - - 41 39 -  Creatinine 0.70 - 1.18 mg/dL 1.31(H) - 1.32(H) 1.20 1.42(H)   BP/Weight 03/18/2015 11/11/2014 07/01/2014 04/22/2014 04/16/2014 03/17/2014 [id number]  Systolic BP Q000111Q A999333 0000000 [id number] XX123456 0000000  Diastolic BP 74 70 62 66 50 80 72  Wt. (Lbs) 131.04 130.6 138 - 139.5 137 139  BMI 21.81 21.73 22.96  - 23.21 22.8 23.13   Foot/eye exam completion dates Latest Ref Rng 11/11/2014 12/10/2013  Eye Exam No Retinopathy - Retinopathy(A)  Foot Form Completion - Done -         Hyperlipidemia Hyperlipidemia:Low fat diet discussed and encouraged.   Lipid Panel  Lab Results  Component Value Date   CHOL 97* 11/06/2014   HDL 46 11/06/2014   LDLCALC 43 11/06/2014   TRIG 41 11/06/2014   CHOLHDL 2.1 11/06/2014   Controlled, no change in medication Updated lab needed at/ before next visit.      GERD (gastroesophageal reflux disease) Controlled, no change in medication        Review of Systems     Objective:   Physical Exam        Assessment & Plan:

## 2015-03-19 LAB — TSH: TSH: 0.952 u[IU]/mL (ref 0.350–4.500)

## 2015-03-19 LAB — COMPREHENSIVE METABOLIC PANEL
ALT: 14 U/L (ref 9–46)
AST: 25 U/L (ref 10–35)
Albumin: 3.7 g/dL (ref 3.6–5.1)
Alkaline Phosphatase: 106 U/L (ref 40–115)
BUN: 22 mg/dL (ref 7–25)
CHLORIDE: 103 mmol/L (ref 98–110)
CO2: 31 mmol/L (ref 20–31)
CREATININE: 1.31 mg/dL — AB (ref 0.70–1.18)
Calcium: 8.9 mg/dL (ref 8.6–10.3)
GLUCOSE: 238 mg/dL — AB (ref 65–99)
Potassium: 4.2 mmol/L (ref 3.5–5.3)
SODIUM: 137 mmol/L (ref 135–146)
TOTAL PROTEIN: 7.5 g/dL (ref 6.1–8.1)
Total Bilirubin: 0.3 mg/dL (ref 0.2–1.2)

## 2015-03-19 LAB — HEMOGLOBIN A1C
HEMOGLOBIN A1C: 9.3 % — AB (ref <5.7)
MEAN PLASMA GLUCOSE: 220 mg/dL — AB (ref <117)

## 2015-04-11 HISTORY — PX: OTHER SURGICAL HISTORY: SHX169

## 2015-04-12 ENCOUNTER — Encounter: Payer: Self-pay | Admitting: Family Medicine

## 2015-04-12 NOTE — Assessment & Plan Note (Signed)
Controlled, no change in medication DASH diet and commitment to daily physical activity for a minimum of 30 minutes discussed and encouraged, as a part of hypertension management. The importance of attaining a healthy weight is also discussed.  BP/Weight 03/18/2015 11/11/2014 07/01/2014 04/22/2014 04/16/2014 03/17/2014 [id number]  Systolic BP Q000111Q A999333 0000000 [id number] XX123456 0000000  Diastolic BP 74 70 62 66 50 80 72  Wt. (Lbs) 131.04 130.6 138 - 139.5 137 139  BMI 21.81 21.73 22.96 - 23.21 22.8 23.13

## 2015-04-12 NOTE — Assessment & Plan Note (Signed)
Deteriorated , needs to f/u with endo and comply with treatment plan Jacob Barnes is reminded of the importance of commitment to daily physical activity for 30 minutes or more, as able and the need to limit carbohydrate intake to 30 to 60 grams per meal to help with blood sugar control.   The need to take medication as prescribed, test blood sugar as directed, and to call between visits if there is a concern that blood sugar is uncontrolled is also discussed.   Jacob Barnes is reminded of the importance of daily foot exam, annual eye examination, and good blood sugar, blood pressure and cholesterol control.  Diabetic Labs Latest Ref Rng 03/18/2015 11/11/2014 11/06/2014 06/22/2014 03/11/2014  HbA1c <5.7 % 9.3(H) - 9.2(H) 9.3(H) 8.9(H)  Microalbumin <2.0 mg/dL - 13.7(H) - - -  Micro/Creat Ratio 0.0 - 30.0 mg/g - 72.3(H) - - -  Chol 125 - 200 mg/dL - - 97(L) 98 -  HDL >=40 mg/dL - - 46 43 -  Calc LDL <130 mg/dL - - 43 47 -  Triglycerides <150 mg/dL - - 41 39 -  Creatinine 0.70 - 1.18 mg/dL 1.31(H) - 1.32(H) 1.20 1.42(H)   BP/Weight 03/18/2015 11/11/2014 07/01/2014 04/22/2014 04/16/2014 03/17/2014 [id number]  Systolic BP Q000111Q A999333 0000000 [id number] XX123456 0000000  Diastolic BP 74 70 62 66 50 80 72  Wt. (Lbs) 131.04 130.6 138 - 139.5 137 139  BMI 21.81 21.73 22.96 - 23.21 22.8 23.13   Foot/eye exam completion dates Latest Ref Rng 11/11/2014 12/10/2013  Eye Exam No Retinopathy - Retinopathy(A)  Foot Form Completion - Done -

## 2015-04-12 NOTE — Assessment & Plan Note (Signed)
Hyperlipidemia:Low fat diet discussed and encouraged.   Lipid Panel  Lab Results  Component Value Date   CHOL 97* 11/06/2014   HDL 46 11/06/2014   LDLCALC 43 11/06/2014   TRIG 41 11/06/2014   CHOLHDL 2.1 11/06/2014   Controlled, no change in medication Updated lab needed at/ before next visit.

## 2015-04-12 NOTE — Assessment & Plan Note (Signed)
Controlled, no change in medication  

## 2015-04-22 ENCOUNTER — Encounter (INDEPENDENT_AMBULATORY_CARE_PROVIDER_SITE_OTHER): Payer: Medicare PPO | Admitting: Ophthalmology

## 2015-05-14 ENCOUNTER — Encounter (INDEPENDENT_AMBULATORY_CARE_PROVIDER_SITE_OTHER): Payer: Medicare PPO | Admitting: Ophthalmology

## 2015-06-19 ENCOUNTER — Other Ambulatory Visit: Payer: Self-pay | Admitting: Family Medicine

## 2015-06-21 ENCOUNTER — Encounter (INDEPENDENT_AMBULATORY_CARE_PROVIDER_SITE_OTHER): Payer: Medicare PPO | Admitting: Ophthalmology

## 2015-06-24 ENCOUNTER — Other Ambulatory Visit: Payer: Self-pay | Admitting: Family Medicine

## 2015-07-19 ENCOUNTER — Encounter: Payer: Self-pay | Admitting: Family Medicine

## 2015-07-19 ENCOUNTER — Other Ambulatory Visit: Payer: Self-pay | Admitting: Family Medicine

## 2015-07-19 ENCOUNTER — Ambulatory Visit (INDEPENDENT_AMBULATORY_CARE_PROVIDER_SITE_OTHER): Payer: Medicare PPO | Admitting: Family Medicine

## 2015-07-19 VITALS — BP 128/72 | HR 76 | Resp 18 | Ht 65.0 in | Wt 131.0 lb

## 2015-07-19 DIAGNOSIS — I10 Essential (primary) hypertension: Secondary | ICD-10-CM

## 2015-07-19 DIAGNOSIS — E785 Hyperlipidemia, unspecified: Secondary | ICD-10-CM

## 2015-07-19 DIAGNOSIS — E1065 Type 1 diabetes mellitus with hyperglycemia: Secondary | ICD-10-CM

## 2015-07-19 DIAGNOSIS — E1069 Type 1 diabetes mellitus with other specified complication: Secondary | ICD-10-CM | POA: Diagnosis not present

## 2015-07-19 DIAGNOSIS — Z0000 Encounter for general adult medical examination without abnormal findings: Secondary | ICD-10-CM

## 2015-07-19 DIAGNOSIS — IMO0002 Reserved for concepts with insufficient information to code with codable children: Secondary | ICD-10-CM

## 2015-07-19 DIAGNOSIS — Z1211 Encounter for screening for malignant neoplasm of colon: Secondary | ICD-10-CM | POA: Diagnosis not present

## 2015-07-19 DIAGNOSIS — Z0001 Encounter for general adult medical examination with abnormal findings: Secondary | ICD-10-CM | POA: Insufficient documentation

## 2015-07-19 LAB — CBC
HEMATOCRIT: 37.2 % — AB (ref 38.5–50.0)
Hemoglobin: 12 g/dL — ABNORMAL LOW (ref 13.2–17.1)
MCH: 30.1 pg (ref 27.0–33.0)
MCHC: 32.3 g/dL (ref 32.0–36.0)
MCV: 93.2 fL (ref 80.0–100.0)
MPV: 12.2 fL (ref 7.5–12.5)
Platelets: 132 10*3/uL — ABNORMAL LOW (ref 140–400)
RBC: 3.99 MIL/uL — AB (ref 4.20–5.80)
RDW: 14.3 % (ref 11.0–15.0)
WBC: 3.1 10*3/uL — ABNORMAL LOW (ref 3.8–10.8)

## 2015-07-19 LAB — POC HEMOCCULT BLD/STL (OFFICE/1-CARD/DIAGNOSTIC): FECAL OCCULT BLD: NEGATIVE

## 2015-07-19 LAB — GLUCOSE, POCT (MANUAL RESULT ENTRY): POC GLUCOSE: 240 mg/dL — AB (ref 70–99)

## 2015-07-19 NOTE — Assessment & Plan Note (Signed)

## 2015-07-19 NOTE — Progress Notes (Signed)
   Subjective:    Patient ID: Jacob Barnes, male    DOB: 05-Oct-1940, 75 y.o.   MRN: [id number]  HPI  Patient is in for annual physical exam. No other health concerns are expressed or addressed at the visit. Medications are reviewed, he takes insulin on average 5 days per week per his report Immunization is reviewed , and  updated if needed. Refuses appt for eye exam, states the last time it gave him a headache  Review of Systems See HPI     Objective:   Physical Exam  BP 128/72 mmHg  Pulse 76  Resp 18  Ht 5\' 5"  (1.651 m)  Wt 131 lb 0.6 oz (59.439 kg)  BMI 21.81 kg/m2  SpO2 96%   Pleasant well nourished male, alert and oriented x 3, in no cardio-pulmonary distress. Afebrile. HEENT No facial trauma or asymetry. Sinuses non tender. EOMI, PERTL, fundoscopic exam is negative for hemorhages or exudates. External ears normal, tympanic membranes clear. Oropharynx moist, no exudate, good dentition. Neck: supple, no adenopathy,JVD or thyromegaly.No bruits.  Chest: Clear to ascultation bilaterally.No crackles or wheezes. Non tender to palpation  Breast: No asymetry,no masses. No nipple discharge or inversion. No axillary or supraclavicular adenopathy  Cardiovascular system; Heart sounds normal,  S1 and  S2 ,no S3.  No murmur, or thrill. Apical beat not displaced Peripheral pulses normal.  Abdomen: Soft, non tender, no organomegaly or masses. No bruits. Bowel sounds normal. No guarding, tenderness or rebound.  Rectal:  Normal sphincter tone. No hemorrhoids or  masses. guaiac negative stool. Prostate smooth and firm  GU: Not examined.  Musculoskeletal exam: Full ROM of spine, hips , shoulders and knees. No deformity ,swelling or crepitus noted. No muscle wasting or atrophy.   Neurologic: Cranial nerves 2 to 12 intact. Power, tone , and reflexes normal throughout.Decreased sensation in feet No disturbance in gait. No tremor.  Skin: Intact, no ulceration,  erythema , scaling or rash noted. Pigmentation normal throughout Exccesiively long and curved toenails, ingrown nails and severe onychomycosis Psych; Normal mood and affect. Judgement and concentration normal       Assessment & Plan:  Annual physical exam Annual exam as documented. Counseling done  re healthy lifestyle involving commitment to 150 minutes exercise per week, heart healthy diet, and attaining healthy weight.The importance of adequate sleep also discussed. Regular seat belt use and home safety, is also discussed. Changes in health habits are decided on by the patient with goals and time frames  set for achieving them. Immunization and cancer screening needs are specifically addressed at this visit.

## 2015-07-19 NOTE — Patient Instructions (Addendum)
Annual wellness in September, call if you need me before  Lab today   You need to see both the podiatrist and your eye Doc for diabetic eye exam

## 2015-07-20 LAB — COMPLETE METABOLIC PANEL WITH GFR
ALBUMIN: 3.6 g/dL (ref 3.6–5.1)
ALK PHOS: 103 U/L (ref 40–115)
ALT: 12 U/L (ref 9–46)
AST: 22 U/L (ref 10–35)
BILIRUBIN TOTAL: 0.5 mg/dL (ref 0.2–1.2)
BUN: 15 mg/dL (ref 7–25)
CALCIUM: 8.6 mg/dL (ref 8.6–10.3)
CHLORIDE: 103 mmol/L (ref 98–110)
CO2: 27 mmol/L (ref 20–31)
CREATININE: 1.31 mg/dL — AB (ref 0.70–1.18)
GFR, EST AFRICAN AMERICAN: 61 mL/min (ref >=60)
GFR, Est Non African American: 53 mL/min — ABNORMAL LOW (ref >=60)
Glucose, Bld: 264 mg/dL — ABNORMAL HIGH (ref 65–99)
Potassium: 4.2 mmol/L (ref 3.5–5.3)
Sodium: 138 mmol/L (ref 135–146)
TOTAL PROTEIN: 7.6 g/dL (ref 6.1–8.1)

## 2015-07-20 LAB — HEMOGLOBIN A1C
HEMOGLOBIN A1C: 9.5 % — AB (ref <5.7)
Mean Plasma Glucose: 226 mg/dL

## 2015-07-20 LAB — TSH: TSH: 0.61 mIU/L (ref 0.40–4.50)

## 2015-07-21 LAB — LIPID PANEL
Cholesterol: 98 mg/dL — ABNORMAL LOW (ref 125–200)
HDL: 44 mg/dL (ref >=40)
LDL CALC: 39 mg/dL (ref <130)
Total CHOL/HDL Ratio: 2.2 Ratio (ref <=5.0)
Triglycerides: 73 mg/dL (ref <150)
VLDL: 15 mg/dL (ref <30)

## 2015-07-21 NOTE — Addendum Note (Signed)
Addended by: Eual Fines on: 07/21/2015 04:17 PM   Modules accepted: Orders

## 2015-07-26 ENCOUNTER — Encounter (INDEPENDENT_AMBULATORY_CARE_PROVIDER_SITE_OTHER): Payer: Medicare PPO | Admitting: Ophthalmology

## 2015-07-26 DIAGNOSIS — H35033 Hypertensive retinopathy, bilateral: Secondary | ICD-10-CM

## 2015-07-26 DIAGNOSIS — E113513 Type 2 diabetes mellitus with proliferative diabetic retinopathy with macular edema, bilateral: Secondary | ICD-10-CM | POA: Diagnosis not present

## 2015-07-26 DIAGNOSIS — H43813 Vitreous degeneration, bilateral: Secondary | ICD-10-CM

## 2015-07-26 DIAGNOSIS — I10 Essential (primary) hypertension: Secondary | ICD-10-CM

## 2015-07-26 DIAGNOSIS — E11311 Type 2 diabetes mellitus with unspecified diabetic retinopathy with macular edema: Secondary | ICD-10-CM

## 2015-08-24 ENCOUNTER — Other Ambulatory Visit: Payer: Self-pay | Admitting: Family Medicine

## 2015-08-25 ENCOUNTER — Other Ambulatory Visit: Payer: Self-pay | Admitting: Family Medicine

## 2015-09-20 ENCOUNTER — Encounter (INDEPENDENT_AMBULATORY_CARE_PROVIDER_SITE_OTHER): Payer: Medicare PPO | Admitting: Ophthalmology

## 2016-01-04 ENCOUNTER — Telehealth: Payer: Self-pay

## 2016-01-04 ENCOUNTER — Ambulatory Visit (INDEPENDENT_AMBULATORY_CARE_PROVIDER_SITE_OTHER): Payer: Medicare PPO

## 2016-01-04 VITALS — BP 136/78 | HR 72 | Resp 18 | Ht 65.0 in | Wt 123.0 lb

## 2016-01-04 DIAGNOSIS — E1065 Type 1 diabetes mellitus with hyperglycemia: Secondary | ICD-10-CM | POA: Diagnosis not present

## 2016-01-04 DIAGNOSIS — E1069 Type 1 diabetes mellitus with other specified complication: Secondary | ICD-10-CM

## 2016-01-04 DIAGNOSIS — Z0000 Encounter for general adult medical examination without abnormal findings: Secondary | ICD-10-CM

## 2016-01-04 DIAGNOSIS — IMO0002 Reserved for concepts with insufficient information to code with codable children: Secondary | ICD-10-CM

## 2016-01-04 NOTE — Progress Notes (Signed)
Subjective:    Jacob Barnes is a 75 y.o. male who presents for Medicare Annual/Subsequent preventive examination.   Preventive Screening-Counseling & Management  Tobacco History  Smoking Status  . Never Smoker  Smokeless Tobacco  . Never Used    Current Problems (verified) Patient Active Problem List   Diagnosis Date Noted  . Annual physical exam 07/19/2015  . Medicare annual wellness visit, subsequent 11/11/2014  . Dysphagia, pharyngoesophageal phase 01/14/2014  . Iron deficiency anemia 01/10/2014  . Other pancytopenia ([place]) 01/10/2014  . Dysphagia 01/08/2014  . CKD (chronic kidney disease) stage 3, GFR 30-59 ml/min 12/15/2013  . Protein-calorie malnutrition ([place]) 12/11/2013  . Hyperlipidemia 07/16/2013  . GERD (gastroesophageal reflux disease) 06/27/2012  . H. pylori infection 09/27/2011  . CARPAL TUNNEL SYNDROME 08/05/2009  . ERECTILE DYSFUNCTION, ORGANIC 01/20/2009  . Essential hypertension 01/02/2009  . Diabetes mellitus, insulin dependent (IDDM), uncontrolled ([place]) 12/21/2008    Medications Prior to Visit Current Outpatient Prescriptions on File Prior to Visit  Medication Sig Dispense Refill  . ACCU-CHEK FASTCLIX LANCETS MISC TEST BLOOD SUGAR THREE TIMES DAILY 306 each 3  . ACCU-CHEK SMARTVIEW test strip USE AS INSTRUCTED THREE TIMES DAILY 300 each 1  . benazepril (LOTENSIN) 5 MG tablet TAKE 1 TABLET EVERY DAY 90 tablet 0  . cholecalciferol (VITAMIN D) 1000 UNITS tablet Take 1,000 Units by mouth daily.    . ferrous sulfate 325 (65 FE) MG tablet Take 325 mg by mouth 2 (two) times daily with a meal.     . latanoprost (XALATAN) 0.005 % ophthalmic solution Place 1 drop into both eyes at bedtime.    . pantoprazole (PROTONIX) 40 MG tablet TAKE 1 TABLET EVERY DAY 90 tablet 1  . pravastatin (PRAVACHOL) 10 MG tablet TAKE 1 TABLET AT BEDTIME 90 tablet 1  . timolol (TIMOPTIC) 0.5 % ophthalmic solution     . Insulin Glargine (LANTUS SOLOSTAR) 100 UNIT/ML Solostar Pen Inject 20  Units into the skin daily at 10 pm. (Patient not taking: Reported on 01/04/2016) 15 pen 1   No current facility-administered medications on file prior to visit.     Current Medications (verified) Current Outpatient Prescriptions  Medication Sig Dispense Refill  . ACCU-CHEK FASTCLIX LANCETS MISC TEST BLOOD SUGAR THREE TIMES DAILY 306 each 3  . ACCU-CHEK SMARTVIEW test strip USE AS INSTRUCTED THREE TIMES DAILY 300 each 1  . benazepril (LOTENSIN) 5 MG tablet TAKE 1 TABLET EVERY DAY 90 tablet 0  . cholecalciferol (VITAMIN D) 1000 UNITS tablet Take 1,000 Units by mouth daily.    . ferrous sulfate 325 (65 FE) MG tablet Take 325 mg by mouth 2 (two) times daily with a meal.     . latanoprost (XALATAN) 0.005 % ophthalmic solution Place 1 drop into both eyes at bedtime.    . pantoprazole (PROTONIX) 40 MG tablet TAKE 1 TABLET EVERY DAY 90 tablet 1  . pravastatin (PRAVACHOL) 10 MG tablet TAKE 1 TABLET AT BEDTIME 90 tablet 1  . timolol (TIMOPTIC) 0.5 % ophthalmic solution     . Insulin Glargine (LANTUS SOLOSTAR) 100 UNIT/ML Solostar Pen Inject 20 Units into the skin daily at 10 pm. (Patient not taking: Reported on 01/04/2016) 15 pen 1   No current facility-administered medications for this visit.      Allergies (verified) Review of patient's allergies indicates no known allergies.   PAST HISTORY  Family History Family History  Problem Relation Age of Onset  . Diabetes Mother   . Diabetes Father   . Heart  disease Father   . Diabetes Brother   . Diabetes Brother   . Diabetes Son     Social History Social History  Substance Use Topics  . Smoking status: Never Smoker  . Smokeless tobacco: Never Used  . Alcohol use No    Are there smokers in your home (other than you)?  No  Risk Factors Current exercise habits: The patient does not participate in regular exercise at present.  Dietary issues discussed: Reviewed with patient and sister Rocco Pauls) the need for patient to eat on a  regular basis and to monitor the foods that he is eating   Cardiac risk factors: advanced age (older than 70 for men, 38 for women), diabetes mellitus, dyslipidemia, hypertension and male gender.  Depression Screen (Note: if answer to either of the following is "Yes", a more complete depression screening is indicated)   Q1: Over the past two weeks, have you felt down, depressed or hopeless? No  Q2: Over the past two weeks, have you felt little interest or pleasure in doing things? No  Have you lost interest or pleasure in daily life? No  Do you often feel hopeless? No  Do you cry easily over simple problems? No  Activities of Daily Living In your present state of health, do you have any difficulty performing the following activities?:  Driving? No Managing money?  No Feeding yourself? No Getting from bed to chair? No Climbing a flight of stairs? No Preparing food and eating?: Does not cook but meals are prepared by family or he eats out; no problems eating  Bathing or showering? No Getting dressed: No Getting to the toilet? No Using the toilet:No Moving around from place to place: No In the past year have you fallen or had a near fall?:No   Are you sexually active?  Yes  Do you have more than one partner?  No  Hearing Difficulties: No Do you often ask people to speak up or repeat themselves? No Do you experience ringing or noises in your ears? No Do you have difficulty understanding soft or whispered voices? No   Do you feel that you have a problem with memory? Yes, forgetfulness   Do you often misplace items? No  Do you feel safe at home?  Yes  Cognitive Testing  Alert? Yes  Normal Appearance?Yes  Oriented to person? Yes  Place? Yes   Time? Yes  Recall of three objects?  Yes  Can perform simple calculations? Yes  Displays appropriate judgment?Yes  Can read the correct time from a watch face?Yes   Advanced Directives have been discussed with the patient? Yes   List  the Names of Other Physician/Practitioners you currently use: 1.  [place] any recent Medical Services you may have received from other than [facility] providers in the past year (date may be approximate).  Immunization History  Administered Date(s) Administered  . Influenza,inj,Quad PF,36+ Mos 03/17/2013, 03/17/2014, 03/18/2015  . PPD Test 08/16/2010, 08/18/2010, 11/03/2013  . Pneumococcal Conjugate-13 11/03/2013  . Pneumococcal Polysaccharide-23 12/21/2008  . Td 12/21/2008    Screening Tests Health Maintenance  Topic Date Due  . INFLUENZA VACCINE  11/09/2015  . ZOSTAVAX  05/03/2016 (Originally 08/18/1999)  . OPHTHALMOLOGY EXAM  08/22/2018 (Originally 12/11/2014)  . HEMOGLOBIN A1C  01/18/2016  . FOOT EXAM  07/18/2016  . TETANUS/TDAP  12/22/2018  . PNA vac Low Risk Adult  Completed    All answers were reviewed with the patient and necessary referrals were  made:  Vanetta Mulders, LPN   [id number]   History reviewed: allergies, current medications, past family history, past medical history, past social history, past surgical history and problem list  Review of Systems A comprehensive review of systems was negative.    Objective:     Vision by Snellen chart: right eye:20/40, left eye:20/40 Blood pressure 136/78, pulse 72, resp. rate 18, height 5\' 5"  (1.651 m), weight 123 lb 0.6 oz (55.8 kg), SpO2 99 %. Body mass index is 20.47 kg/m.  No exam performed today, annual wellness without physical exam.     Assessment:     Plan:     During the course of the visit the patient was educated and counseled about appropriate screening and preventive services including:    Influenza vaccine  Nutrition counseling   Diet review for nutrition referral? Yes ____  Not Indicated __x__   Patient Instructions (the written plan) was given to the patient.  Medicare Attestation I have personally reviewed: The patient's medical and social history Their use  of alcohol, tobacco or illicit drugs Their current medications and supplements The patient's functional ability including ADLs,fall risks, home safety risks, cognitive, and hearing and visual impairment Diet and physical activities Evidence for depression or mood disorders  The patient's weight, height, BMI, and visual acuity have been recorded in the chart.  I have made referrals, counseling, and provided education to the patient based on review of the above and I have provided the patient with a written personalized care plan for preventive services.     Denman George [place], [place]   [id number]

## 2016-01-04 NOTE — Patient Instructions (Signed)
Thank you for choosing [facility] for your health care needs  The Annual Wellness Visit is designed to allow [place] the chance to assist you in preserving and improving you health.   Dr. Moshe Cipro will see you back in 5 months   Lab needed before your visit will be mailed to you  If you have any concerns please don't hesitate to call.  The new # is [phone]

## 2016-01-04 NOTE — Telephone Encounter (Signed)
Past due labs reordered

## 2016-01-05 LAB — COMPLETE METABOLIC PANEL WITH GFR
ALT: 10 U/L (ref 9–46)
AST: 20 U/L (ref 10–35)
Albumin: 3.6 g/dL (ref 3.6–5.1)
Alkaline Phosphatase: 69 U/L (ref 40–115)
BUN: 19 mg/dL (ref 7–25)
CHLORIDE: 101 mmol/L (ref 98–110)
CO2: 28 mmol/L (ref 20–31)
CREATININE: 1.36 mg/dL — AB (ref 0.70–1.18)
Calcium: 8.7 mg/dL (ref 8.6–10.3)
GFR, Est African American: 58 mL/min — ABNORMAL LOW (ref >=60)
GFR, Est Non African American: 50 mL/min — ABNORMAL LOW (ref >=60)
GLUCOSE: 286 mg/dL — AB (ref 65–99)
Potassium: 4.4 mmol/L (ref 3.5–5.3)
SODIUM: 135 mmol/L (ref 135–146)
TOTAL PROTEIN: 7.5 g/dL (ref 6.1–8.1)
Total Bilirubin: 0.5 mg/dL (ref 0.2–1.2)

## 2016-01-05 LAB — HEMOGLOBIN A1C
HEMOGLOBIN A1C: 8.8 % — AB (ref <5.7)
Mean Plasma Glucose: 206 mg/dL

## 2016-02-08 DIAGNOSIS — H401130 Primary open-angle glaucoma, bilateral, stage unspecified: Secondary | ICD-10-CM | POA: Insufficient documentation

## 2016-02-08 DIAGNOSIS — H04123 Dry eye syndrome of bilateral lacrimal glands: Secondary | ICD-10-CM | POA: Insufficient documentation

## 2016-02-08 DIAGNOSIS — E113519 Type 2 diabetes mellitus with proliferative diabetic retinopathy with macular edema, unspecified eye: Secondary | ICD-10-CM | POA: Insufficient documentation

## 2016-02-08 DIAGNOSIS — Z961 Presence of intraocular lens: Secondary | ICD-10-CM | POA: Insufficient documentation

## 2016-02-08 LAB — HM DIABETES EYE EXAM

## 2016-06-06 ENCOUNTER — Ambulatory Visit (INDEPENDENT_AMBULATORY_CARE_PROVIDER_SITE_OTHER): Payer: Medicare HMO | Admitting: Family Medicine

## 2016-06-06 ENCOUNTER — Encounter: Payer: Self-pay | Admitting: Family Medicine

## 2016-06-06 VITALS — BP 130/80 | HR 87 | Resp 16 | Ht 65.0 in | Wt 125.0 lb

## 2016-06-06 DIAGNOSIS — E1065 Type 1 diabetes mellitus with hyperglycemia: Secondary | ICD-10-CM | POA: Diagnosis not present

## 2016-06-06 DIAGNOSIS — E1069 Type 1 diabetes mellitus with other specified complication: Secondary | ICD-10-CM | POA: Diagnosis not present

## 2016-06-06 DIAGNOSIS — IMO0002 Reserved for concepts with insufficient information to code with codable children: Secondary | ICD-10-CM

## 2016-06-06 DIAGNOSIS — I10 Essential (primary) hypertension: Secondary | ICD-10-CM | POA: Diagnosis not present

## 2016-06-06 DIAGNOSIS — Z125 Encounter for screening for malignant neoplasm of prostate: Secondary | ICD-10-CM | POA: Diagnosis not present

## 2016-06-06 DIAGNOSIS — K219 Gastro-esophageal reflux disease without esophagitis: Secondary | ICD-10-CM

## 2016-06-06 DIAGNOSIS — E785 Hyperlipidemia, unspecified: Secondary | ICD-10-CM

## 2016-06-06 DIAGNOSIS — D509 Iron deficiency anemia, unspecified: Secondary | ICD-10-CM

## 2016-06-06 LAB — CBC
HEMATOCRIT: 37.3 % — AB (ref 38.5–50.0)
Hemoglobin: 12.3 g/dL — ABNORMAL LOW (ref 13.2–17.1)
MCH: 30.7 pg (ref 27.0–33.0)
MCHC: 33 g/dL (ref 32.0–36.0)
MCV: 93 fL (ref 80.0–100.0)
MPV: 11.6 fL (ref 7.5–12.5)
Platelets: 163 10*3/uL (ref 140–400)
RBC: 4.01 MIL/uL — AB (ref 4.20–5.80)
RDW: 14.6 % (ref 11.0–15.0)
WBC: 4.6 10*3/uL (ref 3.8–10.8)

## 2016-06-06 NOTE — Patient Instructions (Addendum)
Annual wellness and physical exam last week in August  Lab today hBa1C, chem 7 and EGFR, cBC, iron and ferritin, pSA  Fasting lipid, chem 7 and EGFR, HBA1C  Mid August  Thank you  for choosing [facility]. We consider it a privelige to serve you.  Delivering excellent health care in a caring and  compassionate way is our goal.  Partnering with you,  so that together we can achieve this goal is our strategy.     STOP benazepril and pravastaitin   MUST eat on a regular schedule to control blood sugar.   Uncontrolled blood sugar increases risk kidney failure,, heart disease, stroke and leg amputaton

## 2016-06-07 ENCOUNTER — Encounter: Payer: Self-pay | Admitting: Family Medicine

## 2016-06-07 ENCOUNTER — Telehealth: Payer: Self-pay

## 2016-06-07 DIAGNOSIS — E1069 Type 1 diabetes mellitus with other specified complication: Principal | ICD-10-CM

## 2016-06-07 DIAGNOSIS — IMO0002 Reserved for concepts with insufficient information to code with codable children: Secondary | ICD-10-CM

## 2016-06-07 DIAGNOSIS — E1065 Type 1 diabetes mellitus with hyperglycemia: Secondary | ICD-10-CM

## 2016-06-07 LAB — HEMOGLOBIN A1C
Hgb A1c MFr Bld: 9.4 % — ABNORMAL HIGH (ref <5.7)
Mean Plasma Glucose: 223 mg/dL

## 2016-06-07 LAB — IRON: Iron: 86 ug/dL (ref 50–180)

## 2016-06-07 LAB — BASIC METABOLIC PANEL WITH GFR
BUN: 14 mg/dL (ref 7–25)
CALCIUM: 8.8 mg/dL (ref 8.6–10.3)
CHLORIDE: 103 mmol/L (ref 98–110)
CO2: 28 mmol/L (ref 20–31)
CREATININE: 1.13 mg/dL (ref 0.70–1.18)
GFR, EST NON AFRICAN AMERICAN: 63 mL/min (ref >=60)
GFR, Est African American: 73 mL/min (ref >=60)
GLUCOSE: 206 mg/dL — AB (ref 65–99)
Potassium: 4.4 mmol/L (ref 3.5–5.3)
Sodium: 136 mmol/L (ref 135–146)

## 2016-06-07 LAB — FERRITIN: Ferritin: 70 ng/mL (ref 20–380)

## 2016-06-07 LAB — PSA
PSA: 1 ng/mL (ref <=4.0)
PSA: 1 ng/mL (ref <=4.0)

## 2016-06-07 NOTE — Telephone Encounter (Signed)
-----   Message from Fayrene Helper, MD sent at 06/07/2016  7:39 AM EST ----- pls contact his sister, explain sugar much worse, he does not take insulin regularly, not does he eat on a regular schedule , he needs to do both.  Will need rept hBA1C in 3 months also

## 2016-06-11 ENCOUNTER — Encounter: Payer: Self-pay | Admitting: Family Medicine

## 2016-06-11 NOTE — Assessment & Plan Note (Signed)
Uncontrolled , pt remains non compliant with treatment plan, he is accompanied by his sister, I have again reminded them both of potential dire consequences. Deteriorated Jacob Barnes is reminded of the importance of commitment to daily physical activity for 30 minutes or more, as able and the need to limit carbohydrate intake to 30 to 60 grams per meal to help with blood sugar control.   The need to take medication as prescribed, test blood sugar as directed, and to call between visits if there is a concern that blood sugar is uncontrolled is also discussed.   Jacob Barnes is reminded of the importance of daily foot exam, annual eye examination, and good blood sugar, blood pressure and cholesterol control.  Diabetic Labs Latest Ref Rng & Units 06/06/2016 01/04/2016 07/19/2015 03/18/2015 11/11/2014  HbA1c <5.7 % 9.4(H) 8.8(H) 9.5(H) 9.3(H) -  Microalbumin <2.0 mg/dL - - - - 13.7(H)  Micro/Creat Ratio 0.0 - 30.0 mg/g - - - - 72.3(H)  Chol 125 - 200 mg/dL - - 98(L) - -  HDL >=40 mg/dL - - 44 - -  Calc LDL <130 mg/dL - - 39 - -  Triglycerides <150 mg/dL - - 73 - -  Creatinine 0.70 - 1.18 mg/dL 1.13 1.36(H) 1.31(H) 1.31(H) -   BP/Weight 06/06/2016 01/04/2016 07/19/2015 03/18/2015 11/11/2014 07/01/2014 [id number]  Systolic BP [id number] 0000000 Q000111Q A999333 0000000 [id number]  Diastolic BP 80 78 72 74 70 62 66  Wt. (Lbs) 125 123.04 131.04 131.04 130.6 138 -  BMI 20.8 20.47 21.81 21.81 21.73 22.96 -   Foot/eye exam completion dates Latest Ref Rng & Units 02/08/2016 07/19/2015  Eye Exam No Retinopathy Retinopathy(A) -  Foot Form Completion - - Done

## 2016-06-11 NOTE — Assessment & Plan Note (Signed)
Hyperlipidemia:Low fat diet discussed and encouraged.maintained off meds and controlled   Lipid Panel  Lab Results  Component Value Date   CHOL 98 (L) 07/19/2015   HDL 44 07/19/2015   LDLCALC 39 07/19/2015   TRIG 73 07/19/2015   CHOLHDL 2.2 07/19/2015

## 2016-06-11 NOTE — Progress Notes (Signed)
Jacob Barnes     MRN: [id number]      DOB: Oct 11, 1940   HPI Jacob Barnes is here for follow up and re-evaluation of chronic medical conditions, medication management and review of any available recent lab and radiology data.  Preventive health is updated, specifically  Cancer screening and Immunization.   Questions or concerns regarding consultations or procedures which the PT has had in the interim are  Addressed.Has had eyes addressed since last visit, refuses to see endo The PT denies any adverse reactions to current medications since the last visit.  Takes insulin sporadically and is not eating on schedule Denies polyuria, polydipsia, blurred vision , or hypoglycemic episodes.   ROS Denies recent fever or chills. Denies sinus pressure, nasal congestion, ear pain or sore throat. Denies chest congestion, productive cough or wheezing. Denies chest pains, palpitations and leg swelling Denies abdominal pain, nausea, vomiting,diarrhea or constipation.   Denies dysuria, frequency, hesitancy or incontinence. Denies joint pain, swelling and limitation in mobility. Denies headaches, seizures, numbness, or tingling. Denies depression, anxiety or insomnia. Denies skin break down or rash.   PE  BP 130/80   Pulse 87   Resp 16   Ht 5\' 5"  (1.651 m)   Wt 125 lb (56.7 kg)   SpO2 96%   BMI 20.80 kg/m   Patient alert and oriented and in no cardiopulmonary distress.  HEENT: No facial asymmetry, EOMI,   oropharynx pink and moist.  Neck supple no JVD, no mass.  Chest: Clear to auscultation bilaterally.  CVS: S1, S2 no murmurs, no S3.Regular rate.  ABD: Soft non tender.   Ext: No edema  MS: Adequate ROM spine, shoulders, hips and knees.  Skin: Intact, no ulcerations or rash noted.  Psych: Good eye contact, normal affect. Memory intact not anxious or depressed appearing.  CNS: CN 2-12 intact, power,  normal throughout.no focal deficits noted.   Assessment & Plan  Diabetes  mellitus, insulin dependent (IDDM), uncontrolled Uncontrolled , pt remains non compliant with treatment plan, he is accompanied by his sister, I have again reminded them both of potential dire consequences. Deteriorated Jacob Barnes is reminded of the importance of commitment to daily physical activity for 30 minutes or more, as able and the need to limit carbohydrate intake to 30 to 60 grams per meal to help with blood sugar control.   The need to take medication as prescribed, test blood sugar as directed, and to call between visits if there is a concern that blood sugar is uncontrolled is also discussed.   Jacob Barnes is reminded of the importance of daily foot exam, annual eye examination, and good blood sugar, blood pressure and cholesterol control.  Diabetic Labs Latest Ref Rng & Units 06/06/2016 01/04/2016 07/19/2015 03/18/2015 11/11/2014  HbA1c <5.7 % 9.4(H) 8.8(H) 9.5(H) 9.3(H) -  Microalbumin <2.0 mg/dL - - - - 13.7(H)  Micro/Creat Ratio 0.0 - 30.0 mg/g - - - - 72.3(H)  Chol 125 - 200 mg/dL - - 98(L) - -  HDL >=40 mg/dL - - 44 - -  Calc LDL <130 mg/dL - - 39 - -  Triglycerides <150 mg/dL - - 73 - -  Creatinine 0.70 - 1.18 mg/dL 1.13 1.36(H) 1.31(H) 1.31(H) -   BP/Weight 06/06/2016 01/04/2016 07/19/2015 03/18/2015 11/11/2014 07/01/2014 [id number]  Systolic BP [id number] 0000000 [id number]  Diastolic BP 80 78 72 74 70 62 66  Wt. (Lbs) 125 123.04 131.04 131.04 130.6 138 -  BMI 20.8 20.47 21.81 21.81  21.73 22.96 -   Foot/eye exam completion dates Latest Ref Rng & Units 02/08/2016 07/19/2015  Eye Exam No Retinopathy Retinopathy(A) -  Foot Form Completion - - Done        GERD (gastroesophageal reflux disease) Controlled, no change in medication   Hyperlipidemia Hyperlipidemia:Low fat diet discussed and encouraged.maintained off meds and controlled   Lipid Panel  Lab Results  Component Value Date   CHOL 98 (L) 07/19/2015   HDL 44 07/19/2015   LDLCALC 39 07/19/2015   TRIG 73 07/19/2015    CHOLHDL 2.2 07/19/2015

## 2016-06-11 NOTE — Assessment & Plan Note (Signed)
Controlled, no change in medication  

## 2016-08-21 DIAGNOSIS — L03012 Cellulitis of left finger: Secondary | ICD-10-CM | POA: Diagnosis not present

## 2016-08-23 DIAGNOSIS — S61202D Unspecified open wound of right middle finger without damage to nail, subsequent encounter: Secondary | ICD-10-CM | POA: Diagnosis not present

## 2016-08-23 DIAGNOSIS — L03011 Cellulitis of right finger: Secondary | ICD-10-CM | POA: Diagnosis not present

## 2016-08-28 ENCOUNTER — Other Ambulatory Visit: Payer: Self-pay | Admitting: Family Medicine

## 2016-08-28 ENCOUNTER — Telehealth: Payer: Self-pay | Admitting: Family Medicine

## 2016-08-28 DIAGNOSIS — L03011 Cellulitis of right finger: Secondary | ICD-10-CM | POA: Diagnosis not present

## 2016-08-28 DIAGNOSIS — S61302D Unspecified open wound of right middle finger with damage to nail, subsequent encounter: Secondary | ICD-10-CM | POA: Diagnosis not present

## 2016-08-28 DIAGNOSIS — M869 Osteomyelitis, unspecified: Secondary | ICD-10-CM | POA: Insufficient documentation

## 2016-08-28 NOTE — Telephone Encounter (Signed)
If dr Luna Glasgow / Aline Brochure can see him tomorrow for possible osteomyelitis pls sched wit them, if not pls sched with me , needs ortho appt wherever  No later than Wednesday please, I will give you the x ray report, I have enetered the referral pls let sister know what we are doing

## 2016-08-28 NOTE — Telephone Encounter (Signed)
Radiology report in your box. Jacob Barnes wants an urgent referral to orthopedics. (I did advise that if you needed to see him before the referral that we would see him tomorrow) Wants referral to whomever you prefer for ortho

## 2016-08-28 NOTE — Telephone Encounter (Signed)
Finger is infected been to [facility]  She wants to simpson to look at the discharge paperwork, she is faxing this information. [facility] recommended an orthopedic. Deverne cb#: [phone] She wants someone to call her back to discuss.

## 2016-08-29 ENCOUNTER — Ambulatory Visit (INDEPENDENT_AMBULATORY_CARE_PROVIDER_SITE_OTHER): Payer: Medicare HMO | Admitting: Orthopedic Surgery

## 2016-08-29 ENCOUNTER — Encounter: Payer: Self-pay | Admitting: Orthopedic Surgery

## 2016-08-29 VITALS — BP 124/69 | HR 71 | Wt 122.0 lb

## 2016-08-29 DIAGNOSIS — E138 Other specified diabetes mellitus with unspecified complications: Secondary | ICD-10-CM | POA: Diagnosis not present

## 2016-08-29 DIAGNOSIS — L089 Local infection of the skin and subcutaneous tissue, unspecified: Secondary | ICD-10-CM

## 2016-08-29 NOTE — Addendum Note (Signed)
Addended by: Baldomero Lamy B on: 08/29/2016 11:13 AM   Modules accepted: Orders

## 2016-08-29 NOTE — Progress Notes (Signed)
NEW PATIENT OFFICE VISIT    Chief Complaint  Patient presents with  . Finger Injury    right long finger injury, injury unknown, date unknown    76 year old male presents with 3 week history of difficulty with his right long finger. He reports no trauma he is diabetic. About 3 weeks ago he started having trouble with his fingernail and he treated himself with trimming and over 3 week period he developed swelling pain drainage with elevated glucose intermittently up to 300, no fever chills. He went to urgent care they took an x-ray shows osteomyelitis. He has soft tissue loss of the finger as well as a portion of the nail is gone as well.    Review of Systems  Constitutional: Negative for chills and fever.  Skin: Negative for rash.    Current Outpatient Prescriptions:  .  ACCU-CHEK FASTCLIX LANCETS MISC, TEST BLOOD SUGAR THREE TIMES DAILY, Disp: 306 each, Rfl: 3 .  ACCU-CHEK SMARTVIEW test strip, USE AS INSTRUCTED THREE TIMES DAILY, Disp: 300 each, Rfl: 1 .  cholecalciferol (VITAMIN D) 1000 UNITS tablet, Take 1,000 Units by mouth daily., Disp: , Rfl:  .  ferrous sulfate 325 (65 FE) MG tablet, Take 325 mg by mouth 2 (two) times daily with a meal. , Disp: , Rfl:  .  Insulin Glargine (LANTUS SOLOSTAR) 100 UNIT/ML Solostar Pen, Inject 20 Units into the skin daily at 10 pm., Disp: 15 pen, Rfl: 1 .  latanoprost (XALATAN) 0.005 % ophthalmic solution, Place 1 drop into both eyes at bedtime., Disp: , Rfl:  .  pantoprazole (PROTONIX) 40 MG tablet, TAKE 1 TABLET EVERY DAY, Disp: 90 tablet, Rfl: 1 .  timolol (TIMOPTIC) 0.5 % ophthalmic solution, , Disp: , Rfl:    Past Medical History:  Diagnosis Date  . Diabetes mellitus    says since 1979  . Glaucoma   . Hyperlipemia   . Hypertension     Past Surgical History:  Procedure Laterality Date  . APPENDECTOMY    . COLONOSCOPY  09/06/2011   Procedure: COLONOSCOPY;  Surgeon: Rogene Houston, MD;  [facility];  Service: Endoscopy;   Laterality: N/A;  830  . CYSTOSCOPY/RETROGRADE/URETEROSCOPY/STONE EXTRACTION WITH BASKET    . ESOPHAGOGASTRODUODENOSCOPY N/A 01/15/2014   Procedure: ESOPHAGOGASTRODUODENOSCOPY (EGD);  Surgeon: Rogene Houston, MD;  [facility];  Service: Endoscopy;  Laterality: N/A;  200  . ESOPHAGOGASTRODUODENOSCOPY N/A 04/22/2014   Procedure: ESOPHAGOGASTRODUODENOSCOPY (EGD);  Surgeon: Rogene Houston, MD;  [facility];  Service: Endoscopy;  Laterality: N/A;  240  . MALONEY DILATION N/A 01/15/2014   Procedure: MALONEY DILATION;  Surgeon: Rogene Houston, MD;  [facility];  Service: Endoscopy;  Laterality: N/A;  . MALONEY DILATION  04/22/2014   Procedure: Venia Minks DILATION;  Surgeon: Rogene Houston, MD;  [facility];  Service: Endoscopy;;    Family History  Problem Relation Age of Onset  . Diabetes Mother   . Diabetes Father   . Heart disease Father   . Diabetes Brother   . Diabetes Brother   . Diabetes Son    Social History  Substance Use Topics  . Smoking status: Never Smoker  . Smokeless tobacco: Never Used  . Alcohol use No    BP 124/69   Pulse 71   Wt 122 lb (55.3 kg)   BMI 20.30 kg/m   Physical Exam  Constitutional: He appears well-developed and well-nourished.  Vital signs have been reviewed and are stable. Gen.  appearance the patient is well-developed and well-nourished with normal grooming and hygiene. The patient is oriented 3 with normal mood and affect.  Vitals reviewed.   Ortho Exam Right long finger. Alignment is normal. There is a significant gross deformity of the nail and soft tissue loss on the volar aspect. The MP joint and PIP joint move normally the DIP joint has 20 of flexion. No instability in any of those joints. Flexor tendons and extensor tendons remain intact. He appears to have normal sensation in the digit the color and capillary refill cannot be assessed the radial artery pulse was normal. The skin loss is noted.  There is swelling and there is a foul-smelling tissue there. No evidence of gangrene.  No gait abnormalities  Left hand is normal. The shoulder elbow is normal the axillary and epitrochlear lymph nodes are normal   Encounter Diagnoses  Name Primary?  . Infection of finger Yes  . Diabetes mellitus of other type with complication, unspecified whether long term insulin use (HCC)      PLAN:   Patient will need an amputation at some level involving this right long finger. We recommend he see a hand specialist for that.

## 2016-08-30 ENCOUNTER — Encounter (INDEPENDENT_AMBULATORY_CARE_PROVIDER_SITE_OTHER): Payer: Self-pay | Admitting: Orthopaedic Surgery

## 2016-08-30 ENCOUNTER — Ambulatory Visit (INDEPENDENT_AMBULATORY_CARE_PROVIDER_SITE_OTHER): Payer: Medicare HMO | Admitting: Orthopaedic Surgery

## 2016-08-30 ENCOUNTER — Encounter (HOSPITAL_COMMUNITY): Payer: Self-pay

## 2016-08-30 ENCOUNTER — Encounter (HOSPITAL_COMMUNITY)
Admission: RE | Admit: 2016-08-30 | Discharge: 2016-08-30 | Disposition: A | Discharge status: Home / Self Care | Payer: Medicare HMO | Source: Ambulatory Visit | Attending: Orthopaedic Surgery | Admitting: Orthopaedic Surgery

## 2016-08-30 DIAGNOSIS — I10 Essential (primary) hypertension: Secondary | ICD-10-CM | POA: Diagnosis not present

## 2016-08-30 DIAGNOSIS — E1169 Type 2 diabetes mellitus with other specified complication: Secondary | ICD-10-CM | POA: Diagnosis not present

## 2016-08-30 DIAGNOSIS — Z79899 Other long term (current) drug therapy: Secondary | ICD-10-CM | POA: Diagnosis not present

## 2016-08-30 DIAGNOSIS — M869 Osteomyelitis, unspecified: Secondary | ICD-10-CM | POA: Diagnosis not present

## 2016-08-30 DIAGNOSIS — H409 Unspecified glaucoma: Secondary | ICD-10-CM | POA: Diagnosis not present

## 2016-08-30 DIAGNOSIS — Z794 Long term (current) use of insulin: Secondary | ICD-10-CM | POA: Diagnosis not present

## 2016-08-30 DIAGNOSIS — K219 Gastro-esophageal reflux disease without esophagitis: Secondary | ICD-10-CM | POA: Diagnosis not present

## 2016-08-30 DIAGNOSIS — D649 Anemia, unspecified: Secondary | ICD-10-CM | POA: Diagnosis not present

## 2016-08-30 HISTORY — DX: Gastro-esophageal reflux disease without esophagitis: K21.9

## 2016-08-30 HISTORY — DX: Local infection of the skin and subcutaneous tissue, unspecified: S60.412A

## 2016-08-30 HISTORY — DX: Anemia, unspecified: D64.9

## 2016-08-30 HISTORY — DX: Unspecified osteoarthritis, unspecified site: M19.90

## 2016-08-30 HISTORY — DX: Local infection of the skin and subcutaneous tissue, unspecified: L08.9

## 2016-08-30 HISTORY — DX: Personal history of urinary calculi: Z87.442

## 2016-08-30 LAB — CBC
HEMATOCRIT: 35.3 % — AB (ref 39.0–52.0)
HEMOGLOBIN: 11.7 g/dL — AB (ref 13.0–17.0)
MCH: 30.4 pg (ref 26.0–34.0)
MCHC: 33.1 g/dL (ref 30.0–36.0)
MCV: 91.7 fL (ref 78.0–100.0)
Platelets: 256 10*3/uL (ref 150–400)
RBC: 3.85 MIL/uL — ABNORMAL LOW (ref 4.22–5.81)
RDW: 14.3 % (ref 11.5–15.5)
WBC: 4.8 10*3/uL (ref 4.0–10.5)

## 2016-08-30 LAB — BASIC METABOLIC PANEL
Anion gap: 5 (ref 5–15)
BUN: 23 mg/dL — ABNORMAL HIGH (ref 6–20)
CO2: 26 mmol/L (ref 22–32)
Calcium: 8.9 mg/dL (ref 8.9–10.3)
Chloride: 102 mmol/L (ref 101–111)
Creatinine, Ser: 1.66 mg/dL — ABNORMAL HIGH (ref 0.61–1.24)
GFR calc Af Amer: 44 mL/min — ABNORMAL LOW (ref >60)
GFR, EST NON AFRICAN AMERICAN: 38 mL/min — AB (ref >60)
GLUCOSE: 75 mg/dL (ref 65–99)
Potassium: 5.3 mmol/L — ABNORMAL HIGH (ref 3.5–5.1)
Sodium: 133 mmol/L — ABNORMAL LOW (ref 135–145)

## 2016-08-30 LAB — GLUCOSE, CAPILLARY: Glucose-Capillary: 81 mg/dL (ref 65–99)

## 2016-08-30 MED ORDER — DOXYCYCLINE HYCLATE 100 MG PO TABS: 100.0000 mg | ORAL_TABLET | Freq: Two times a day (BID) | ORAL | 0 refills | Status: DC

## 2016-08-30 NOTE — Progress Notes (Signed)
Need orders for 5-25 surgery in epic asap pt coming at 1100 today for pre op.

## 2016-08-30 NOTE — Patient Instructions (Addendum)
Jacob Barnes  08/30/2016   Your procedure is scheduled on: 09-01-16  Report to [facility] Main  Entrance Take [place]  elevators to 3rd floor to  [place] at 715 AM.   Call this number if you have problems the morning of surgery [phone]   Remember: ONLY 1 PERSON MAY GO WITH YOU TO SHORT STAY TO GET  READY MORNING OF [place].  Do not eat food or drink liquids :After Midnight.     Take these medicines the morning of surgery with A SIP OF WATER: PANTAPRAZOLE ([place]), DOXYCYCLINE                               You may not have any metal on your body including hair pins and              piercings  Do not wear jewelry, make-up, lotions, powders or perfumes, deodorant             Do not wear nail polish.  Do not shave  48 hours prior to surgery.              Men may shave face and neck.   Do not bring valuables to the hospital. [place].  Contacts, dentures or bridgework may not be worn into surgery.  Leave suitcase in the car. After surgery it may be brought to your room.   Driver sister devern robertson cell [phone]                Please read over the following fact sheets you were given: _____________________________________________________________________             How to Manage Your Diabetes Before and After Surgery  Why is it important to control my blood sugar before and after surgery? . Improving blood sugar levels before and after surgery helps healing and can limit problems. . A way of improving blood sugar control is eating a healthy diet by: o  Eating less sugar and carbohydrates o  Increasing activity/exercise o  Talking with your doctor about reaching your blood sugar goals . High blood sugars (greater than 180 mg/dL) can raise your risk of infections and slow your recovery, so you will need to focus on controlling your diabetes during the weeks before surgery. . Make  sure that the doctor who takes care of your diabetes knows about your planned surgery including the date and location.  How do I manage my blood sugar before surgery? . Check your blood sugar at least 4 times a day, starting 2 days before surgery, to make sure that the level is not too high or low. o Check your blood sugar the morning of your surgery when you wake up and every 2 hours until you get to the Short Stay unit. . If your blood sugar is less than 70 mg/dL, you will need to treat for low blood sugar: o Do not take insulin. o Treat a low blood sugar (less than 70 mg/dL) with  cup of clear juice (cranberry or apple), 4 glucose tablets, OR glucose gel. o Recheck blood sugar in 15 minutes after treatment (to make sure it is greater than 70 mg/dL). If your blood sugar is not greater  than 70 mg/dL on recheck, call [phone] for further instructions. . Report your blood sugar to the short stay nurse when you get to Short Stay.  . If you are admitted to the hospital after surgery: o Your blood sugar will be checked by the staff and you will probably be given insulin after surgery (instead of oral diabetes medicines) to make sure you have good blood sugar levels. o The goal for blood sugar control after surgery is 80-180 mg/dL.   WHAT DO I DO ABOUT MY DIABETES MEDICATION?   . THE NIGHT BEFORE SURGERY, take 5   units of   Lantus    insulin.        .   .    Patient Signature:  Date:   Nurse Signature:  Date:   Reviewed and Endorsed by [place] Patient Education Committee, August 2015[facility] - Preparing for Surgery Before surgery, you can play an important role.  Because skin is not sterile, your skin needs to be as free of germs as possible.  You can reduce the number of germs on your skin by washing with CHG (chlorahexidine gluconate) soap before surgery.  CHG is an antiseptic cleaner which kills germs and bonds with the skin to continue killing germs even after  washing. Please DO NOT use if you have an allergy to CHG or antibacterial soaps.  If your skin becomes reddened/irritated stop using the CHG and inform your nurse when you arrive at Short Stay. Do not shave (including legs and underarms) for at least 48 hours prior to the first CHG shower.  You may shave your face/neck. Please follow these instructions carefully:  1.  Shower with CHG Soap the night before surgery and the  morning of Surgery.  2.  If you choose to wash your hair, wash your hair first as usual with your  normal  shampoo.  3.  After you shampoo, rinse your hair and body thoroughly to remove the  shampoo.                           4.  Use CHG as you would any other liquid soap.  You can apply chg directly  to the skin and wash                       Gently with a scrungie or clean washcloth.  5.  Apply the CHG Soap to your body ONLY FROM THE NECK DOWN.   Do not use on face/ open                           Wound or open sores. Avoid contact with eyes, ears mouth and genitals (private parts).                       Wash face,  Genitals (private parts) with your normal soap.             6.  Wash thoroughly, paying special attention to the area where your surgery  will be performed.  7.  Thoroughly rinse your body with warm water from the neck down.  8.  DO NOT shower/wash with your normal soap after using and rinsing off  the CHG Soap.                9.  Pat yourself dry with a clean towel.  10.  Wear clean pajamas.            11.  Place clean sheets on your bed the night of your first shower and do not  sleep with pets. Day of Surgery : Do not apply any lotions/deodorants the morning of surgery.  Please wear clean clothes to the hospital/surgery center.

## 2016-08-30 NOTE — Progress Notes (Signed)
Office Visit Note   Patient: Jacob Barnes           Date of Birth: Nov 01, 1940           MRN: [id number] Visit Date: 08/30/2016              Requested by: Fayrene Helper, [address], [place], [place] [address] PCP: Fayrene Helper, MD   Assessment & Plan: Visit Diagnoses:  1. Osteomyelitis of finger of right hand ([place])     Plan: I talked to the patient is white. This obviously has an infection going down to the bone of the middle fingertip. He understands the only treatment for this would be a distal finger amputation of the right middle finger. I explained in detail what this involves and then over his exam and x-rays. All questions were encouraged and answered. I talked about the risk and benefits of surgery and why we are recommending this. For now I would have him on doxycycline twice daily and he'll soak his finger and also the water once daily followed by placing triple anabolic ointment over the wound. He'll then place Band-Aids over this. We'll set him up for surgery in the near future and we'll see him back in 2 weeks postoperative for wound check and suture removal.  Follow-Up Instructions: Return for 2 weeks post-op.   Orders:  No orders of the defined types were placed in this encounter.  No orders of the defined types were placed in this encounter.     Procedures: No procedures performed   Clinical Data: No additional findings.   Subjective: Chief Complaint  Patient presents with  . Right Middle Finger - Edema, Open Wound  The patient is a 76 year old diabetic male who has had an infection involving his right middle fingertip for several weeks now. He said the nail clipped and is been to urgent care. He even saw her orthopedic surgeon in [place] yesterday who sent him down here for further evaluation and treatment of his fingertip infection. It sounds like his blood glucose control has been marginally controlled. He denies a specific pain  but the finger has been swollen and draining with a foul odor for some time now. It is worsened over the last several days.  HPI  Review of Systems He currently denies any headache, chest pain, shortness of breath, fever, chills, nausea, vomiting.  Objective: Vital Signs: There were no vitals taken for this visit.  Physical Exam He is alert and or 3 and in no acute distress Ortho Exam Examination of his right middle fingertip shows infection involving the nailbed and the nail itself. This does communicate with the bone as well. There is foul odor and drainage Specialty Comments:  No specialty comments available.  Imaging: No results found. X-rays independently reviewed of his right hand show questionable osteomyelitis at the tip of the distal phalanx of his middle finger. There is changes to the bone that are consistent with infectious process.  PMFS History: Patient Active Problem List   Diagnosis Date Noted  . Osteomyelitis of finger of right hand ([place]) 08/30/2016  . Osteomyelitis of finger ([place]) 08/28/2016  . Dysphagia, pharyngoesophageal phase 01/14/2014  . Iron deficiency anemia 01/10/2014  . Other pancytopenia ([place]) 01/10/2014  . Dysphagia 01/08/2014  . CKD (chronic kidney disease) stage 3, GFR 30-59 ml/min 12/15/2013  . Protein-calorie malnutrition ([place]) 12/11/2013  . Hyperlipidemia 07/16/2013  . GERD (gastroesophageal reflux disease) 06/27/2012  . CARPAL TUNNEL SYNDROME  08/05/2009  . ERECTILE DYSFUNCTION, ORGANIC 01/20/2009  . Essential hypertension 01/02/2009  . Diabetes mellitus, insulin dependent (IDDM), uncontrolled ([place]) 12/21/2008   Past Medical History:  Diagnosis Date  . Diabetes mellitus    says since 1979  . Glaucoma   . Hyperlipemia   . Hypertension     Family History  Problem Relation Age of Onset  . Diabetes Mother   . Diabetes Father   . Heart disease Father   . Diabetes Brother   . Diabetes Brother   . Diabetes Son     Past Surgical  History:  Procedure Laterality Date  . APPENDECTOMY    . COLONOSCOPY  09/06/2011   Procedure: COLONOSCOPY;  Surgeon: Rogene Houston, MD;  [facility];  Service: Endoscopy;  Laterality: N/A;  830  . CYSTOSCOPY/RETROGRADE/URETEROSCOPY/STONE EXTRACTION WITH BASKET    . ESOPHAGOGASTRODUODENOSCOPY N/A 01/15/2014   Procedure: ESOPHAGOGASTRODUODENOSCOPY (EGD);  Surgeon: Rogene Houston, MD;  [facility];  Service: Endoscopy;  Laterality: N/A;  200  . ESOPHAGOGASTRODUODENOSCOPY N/A 04/22/2014   Procedure: ESOPHAGOGASTRODUODENOSCOPY (EGD);  Surgeon: Rogene Houston, MD;  [facility];  Service: Endoscopy;  Laterality: N/A;  240  . MALONEY DILATION N/A 01/15/2014   Procedure: MALONEY DILATION;  Surgeon: Rogene Houston, MD;  [facility];  Service: Endoscopy;  Laterality: N/A;  . MALONEY DILATION  04/22/2014   Procedure: Venia Minks DILATION;  Surgeon: Rogene Houston, MD;  [facility];  Service: Endoscopy;;   Social History   Occupational History  . Not on file.   Social History Main Topics  . Smoking status: Never Smoker  . Smokeless tobacco: Never Used  . Alcohol use No  . Drug use: No  . Sexual activity: Yes

## 2016-08-30 NOTE — Progress Notes (Signed)
bmet results sent to dr Harrell Gave blackmsn by epic

## 2016-08-31 ENCOUNTER — Encounter (HOSPITAL_COMMUNITY): Payer: Self-pay | Admitting: *Deleted

## 2016-08-31 LAB — HEMOGLOBIN A1C
HEMOGLOBIN A1C: 10.1 % — AB (ref 4.8–5.6)
MEAN PLASMA GLUCOSE: 243 mg/dL

## 2016-08-31 NOTE — Progress Notes (Signed)
heamglobin a 1 c results sent by epic to dr Ninfa Linden

## 2016-08-31 NOTE — Progress Notes (Signed)
Need orders in epic for 09-01-16 surgery

## 2016-09-01 ENCOUNTER — Ambulatory Visit (HOSPITAL_COMMUNITY): Payer: Medicare HMO | Admitting: Anesthesiology

## 2016-09-01 ENCOUNTER — Encounter (HOSPITAL_COMMUNITY): Payer: Self-pay

## 2016-09-01 ENCOUNTER — Ambulatory Visit (HOSPITAL_COMMUNITY)
Admission: RE | Admit: 2016-09-01 | Discharge: 2016-09-01 | Disposition: A | Discharge status: Home / Self Care | Payer: Medicare HMO | Source: Ambulatory Visit | Attending: Orthopaedic Surgery | Admitting: Orthopaedic Surgery

## 2016-09-01 ENCOUNTER — Encounter (HOSPITAL_COMMUNITY)
Admission: RE | Disposition: A | Discharge status: Home / Self Care | Payer: Self-pay | Source: Ambulatory Visit | Attending: Orthopaedic Surgery

## 2016-09-01 ENCOUNTER — Other Ambulatory Visit (INDEPENDENT_AMBULATORY_CARE_PROVIDER_SITE_OTHER): Payer: Self-pay | Admitting: Physician Assistant

## 2016-09-01 DIAGNOSIS — D649 Anemia, unspecified: Secondary | ICD-10-CM | POA: Diagnosis not present

## 2016-09-01 DIAGNOSIS — Z79899 Other long term (current) drug therapy: Secondary | ICD-10-CM | POA: Diagnosis not present

## 2016-09-01 DIAGNOSIS — E119 Type 2 diabetes mellitus without complications: Secondary | ICD-10-CM | POA: Diagnosis not present

## 2016-09-01 DIAGNOSIS — M869 Osteomyelitis, unspecified: Secondary | ICD-10-CM | POA: Diagnosis present

## 2016-09-01 DIAGNOSIS — Z794 Long term (current) use of insulin: Secondary | ICD-10-CM | POA: Diagnosis not present

## 2016-09-01 DIAGNOSIS — H409 Unspecified glaucoma: Secondary | ICD-10-CM | POA: Diagnosis not present

## 2016-09-01 DIAGNOSIS — I10 Essential (primary) hypertension: Secondary | ICD-10-CM | POA: Diagnosis not present

## 2016-09-01 DIAGNOSIS — E1169 Type 2 diabetes mellitus with other specified complication: Secondary | ICD-10-CM | POA: Diagnosis not present

## 2016-09-01 DIAGNOSIS — K219 Gastro-esophageal reflux disease without esophagitis: Secondary | ICD-10-CM | POA: Insufficient documentation

## 2016-09-01 DIAGNOSIS — L089 Local infection of the skin and subcutaneous tissue, unspecified: Secondary | ICD-10-CM | POA: Diagnosis not present

## 2016-09-01 HISTORY — PX: AMPUTATION: SHX166

## 2016-09-01 HISTORY — DX: Rash and other nonspecific skin eruption: R21

## 2016-09-01 LAB — GLUCOSE, CAPILLARY
GLUCOSE-CAPILLARY: 145 mg/dL — AB (ref 65–99)
GLUCOSE-CAPILLARY: 135 mg/dL — AB (ref 65–99)

## 2016-09-01 SURGERY — AMPUTATION DIGIT
Anesthesia: Monitor Anesthesia Care | Laterality: Right

## 2016-09-01 MED ORDER — LIDOCAINE HCL 1 % IJ SOLN
INTRAMUSCULAR | Status: AC
  Filled 2016-09-01: qty 20

## 2016-09-01 MED ORDER — PROPOFOL 10 MG/ML IV BOLUS
INTRAVENOUS | Status: AC
  Filled 2016-09-01: qty 40

## 2016-09-01 MED ORDER — CEFAZOLIN SODIUM-DEXTROSE 2-4 GM/100ML-% IV SOLN
INTRAVENOUS | Status: AC
  Filled 2016-09-01: qty 100

## 2016-09-01 MED ORDER — BUPIVACAINE HCL (PF) 0.5 % IJ SOLN
INTRAMUSCULAR | Status: AC
  Filled 2016-09-01: qty 30

## 2016-09-01 MED ORDER — ONDANSETRON HCL 4 MG/2ML IJ SOLN
INTRAMUSCULAR | Status: AC
  Filled 2016-09-01: qty 2

## 2016-09-01 MED ORDER — CHLORHEXIDINE GLUCONATE 4 % EX LIQD: 60.0000 mL | Freq: Once | CUTANEOUS | Status: DC

## 2016-09-01 MED ORDER — LACTATED RINGERS IV SOLN
INTRAVENOUS | Status: DC
  Administered 2016-09-01: 08:00:00 via INTRAVENOUS

## 2016-09-01 MED ORDER — SODIUM CHLORIDE 0.9 % IR SOLN
Status: DC | PRN
  Administered 2016-09-01: 1000 mL

## 2016-09-01 MED ORDER — PROPOFOL 500 MG/50ML IV EMUL
INTRAVENOUS | Status: DC | PRN
Start: 2016-09-01 — End: 2016-09-01
  Administered 2016-09-01: 30 mg via INTRAVENOUS

## 2016-09-01 MED ORDER — ONDANSETRON HCL 4 MG/2ML IJ SOLN: 4.0000 mg | Freq: Once | INTRAMUSCULAR | Status: DC | PRN

## 2016-09-01 MED ORDER — CEFAZOLIN SODIUM-DEXTROSE 2-4 GM/100ML-% IV SOLN
2.0000 g | INTRAVENOUS | Status: AC
  Administered 2016-09-01: 2 g via INTRAVENOUS

## 2016-09-01 MED ORDER — FENTANYL CITRATE (PF) 100 MCG/2ML IJ SOLN: 25.0000 ug | INTRAMUSCULAR | Status: DC | PRN

## 2016-09-01 MED ORDER — PROPOFOL 500 MG/50ML IV EMUL
INTRAVENOUS | Status: DC | PRN
  Administered 2016-09-01: 100 ug/kg/min via INTRAVENOUS

## 2016-09-01 MED ORDER — BUPIVACAINE HCL 0.5 % IJ SOLN
INTRAMUSCULAR | Status: DC | PRN
Start: 2016-09-01 — End: 2016-09-01
  Administered 2016-09-01: 7 mL

## 2016-09-01 MED ORDER — HYDROCODONE-ACETAMINOPHEN 5-325 MG PO TABS: 1.0000 | ORAL_TABLET | ORAL | 0 refills | Status: DC | PRN

## 2016-09-01 MED ORDER — LIDOCAINE HCL (PF) 2 % IJ SOLN
INTRAMUSCULAR | Status: AC
  Filled 2016-09-01: qty 10

## 2016-09-01 MED ORDER — LIDOCAINE HCL 1 % IJ SOLN
INTRAMUSCULAR | Status: DC | PRN
  Administered 2016-09-01: 8 mL

## 2016-09-01 SURGICAL SUPPLY — 25 items
BNDG COHESIVE 1X5 TAN STRL LF (GAUZE/BANDAGES/DRESSINGS) ×2 IMPLANT
BNDG COHESIVE 4X5 TAN STRL (GAUZE/BANDAGES/DRESSINGS) ×2 IMPLANT
BNDG CONFORM 2 STRL LF (GAUZE/BANDAGES/DRESSINGS) ×2 IMPLANT
BNDG ESMARK 4X9 LF (GAUZE/BANDAGES/DRESSINGS) ×2 IMPLANT
BNDG GAUZE ELAST 4 BULKY (GAUZE/BANDAGES/DRESSINGS) ×2 IMPLANT
COVER SURGICAL LIGHT HANDLE (MISCELLANEOUS) ×2 IMPLANT
DRAIN PENROSE 18X1/4 LTX STRL (WOUND CARE) ×2 IMPLANT
DRAPE U-SHAPE 47X51 STRL (DRAPES) ×2 IMPLANT
ELECT PENCIL ROCKER SW 15FT (MISCELLANEOUS) ×2 IMPLANT
ELECT REM PT RETURN 15FT ADLT (MISCELLANEOUS) ×2 IMPLANT
GAUZE SPONGE 4X4 12PLY STRL (GAUZE/BANDAGES/DRESSINGS) ×2 IMPLANT
GAUZE XEROFORM 1X8 LF (GAUZE/BANDAGES/DRESSINGS) ×2 IMPLANT
GLOVE BIO SURGEON STRL SZ7.5 (GLOVE) ×2 IMPLANT
GLOVE BIOGEL PI IND STRL 8 (GLOVE) ×1 IMPLANT
GLOVE BIOGEL PI INDICATOR 8 (GLOVE) ×1
GOWN STRL REUS W/TWL XL LVL3 (GOWN DISPOSABLE) ×2 IMPLANT
KIT BASIN OR (CUSTOM PROCEDURE TRAY) ×2 IMPLANT
PACK ORTHO EXTREMITY (CUSTOM PROCEDURE TRAY) ×2 IMPLANT
POSITIONER SURGICAL ARM (MISCELLANEOUS) ×2 IMPLANT
SUT ETHILON 2 0 PS N (SUTURE) ×4 IMPLANT
SWAB COLLECTION DEVICE MRSA (MISCELLANEOUS) ×2 IMPLANT
SWAB CULTURE ESWAB REG 1ML (MISCELLANEOUS) ×2 IMPLANT
SYR BULB IRRIGATION 50ML (SYRINGE) ×2 IMPLANT
TOWEL OR 17X26 10 PK STRL BLUE (TOWEL DISPOSABLE) ×4 IMPLANT
YANKAUER SUCT BULB TIP NO VENT (SUCTIONS) ×2 IMPLANT

## 2016-09-01 NOTE — Progress Notes (Signed)
Patients wife called stating patient over slept and blood sugar this morning is 54.  Spoke with Gifford Shave, Anesthesiologist who advised patient could have 4 oz apple juice or 4 oz orange juice with no pulp.  Advised wife who gave patient apple juice.

## 2016-09-01 NOTE — Op Note (Signed)
NAME:  Jacob Barnes, Jacob Barnes                        ACCOUNT NO.:  MEDICAL RECORD NO.:  [id number]  LOCATION:                                 FACILITY:  PHYSICIAN:  Lind Guest. Ninfa Linden, M.D.DATE OF BIRTH:  DATE OF PROCEDURE:  09/01/2016 DATE OF DISCHARGE:                              OPERATIVE REPORT   PREOPERATIVE DIAGNOSIS:  Right middle finger infection with osteomyelitis of the distal phalanx.  POSTOPERATIVE DIAGNOSIS:  Right middle finger infection with osteomyelitis of the distal phalanx.  PROCEDURE:  Right middle finger amputation through the middle phalanx.  SURGEON:  Lind Guest. Ninfa Linden, M.D.  ASSISTANT:  Erskine Emery, PA-C.  ANESTHESIA: 1. Mask ventilation, IV sedation. 2. Local digital block, first with 1% plain lidocaine followed by     0.25% plain Marcaine.  BLOOD LOSS:  Minimal.  COMPLICATIONS:  None.  INDICATIONS:  [name] is a 76 year old type 2 diabetic, who developed a finger infection of his right middle finger.  This infection went through the nail and all the way down to the bone.  X-ray evidence showed osteomyelitis.  The fingertip is malodorous with purulent drainage as well.  Given these findings, the only treatment option at this point is an amputation.  He and his family understand this fully. The risks and benefits of this were explained to him in detail and all questions were encouraged and answered.  PROCEDURE DESCRIPTION:  After informed consent was obtained, appropriate right middle finger was marked.  He was brought to the operating room, placed supine on the operating table with the right arm on the arm table.  Mask ventilation, IV sedation were obtained.  His right hand was prepped and draped with DuraPrep and sterile drapes.  Time-out was called and she was identified as correct patient, correct right hand.  I then performed a digital block first with plain lidocaine for the right middle finger followed by plain Marcaine.  We then  used a Penrose drain around the proximal finger of the middle finger as a local tourniquet. I then made an incision, carried this in a fishmouth format around the middle phalanx where there was good soft tissue.  We then used a bone cutting forceps and cut the bone to nice healthy cortical and cancellous bone of the middle phalanx.  We then identified the neurovascular bundles and were able to cauterize these.  Both flexor and extensor tendons were cut to retract.  We then let the Penrose drain down and hemostasis was obtained with electrocautery.  We irrigated the soft tissue with normal saline solution and then closed the end of the finger with a simple closure format with interrupted nylon suture.  Xeroform and well- padded sterile dressing were applied.  He was taken to the recovery room in stable condition.  All final counts were correct.  There were no complications noted.     Lind Guest. Ninfa Linden, M.D.     CYB/MEDQ  D:  09/01/2016  T:  09/01/2016  Job:  [id number]

## 2016-09-01 NOTE — Transfer of Care (Signed)
Immediate Anesthesia Transfer of Care Note  Patient: Jacob Barnes  Procedure(s) Performed: Procedure(s): AMPUTATION RIGHT MIDDLE FINGER TIP (Right)  Patient Location: PACU  Anesthesia Type:MAC  Level of Consciousness: awake, alert  and oriented  Airway & Oxygen Therapy: Patient Spontanous Breathing and Patient connected to face mask oxygen  Post-op Assessment: Report given to RN and Post -op Vital signs reviewed and stable  Post vital signs: Reviewed and stable  Last Vitals:  Vitals:   09/01/16 0752 09/01/16 0939  BP: 140/73 107/64  Pulse: 72 (!) 57  Resp: 18 (P) 13  Temp: 36.5 C 36.5 C    Last Pain:  Vitals:   09/01/16 0808  TempSrc:   PainSc: 0-No pain         Complications: No apparent anesthesia complications

## 2016-09-01 NOTE — Discharge Instructions (Signed)
Leave th e dressing on and in place on your right hand until your next follow-up. Keep your dressing clean and dry.

## 2016-09-01 NOTE — Anesthesia Postprocedure Evaluation (Signed)
Anesthesia Post Note  Patient: Jacob Barnes  Procedure(s) Performed: Procedure(s) (LRB): AMPUTATION RIGHT MIDDLE FINGER TIP (Right)  Patient location during evaluation: PACU Anesthesia Type: MAC Level of consciousness: awake and alert Pain management: pain level controlled Vital Signs Assessment: post-procedure vital signs reviewed and stable Respiratory status: spontaneous breathing, nonlabored ventilation, respiratory function stable and patient connected to nasal cannula oxygen Cardiovascular status: stable and blood pressure returned to baseline Anesthetic complications: no       Last Vitals:  Vitals:   09/01/16 1000 09/01/16 1010  BP: 128/72 134/83  Pulse: (!) 58 (!) 58  Resp: 18 14  Temp: 36.5 C 36.3 C    Last Pain:  Vitals:   09/01/16 1010  TempSrc:   PainSc: 0-No pain                 Khrystian Schauf P Zalia Hautala

## 2016-09-01 NOTE — Anesthesia Preprocedure Evaluation (Addendum)
Anesthesia Evaluation  Patient identified by MRN, date of birth, ID band Patient awake    Reviewed: Allergy & Precautions, NPO status , Patient's Chart, lab work & pertinent test results  Airway Mallampati: I  TM Distance: >3 FB Neck ROM: Full    Dental no notable dental hx. (+) Edentulous Upper, Edentulous Lower   Pulmonary neg pulmonary ROS,    Pulmonary exam normal breath sounds clear to auscultation       Cardiovascular hypertension, Normal cardiovascular exam Rhythm:Regular Rate:Normal  ECG: NSR, rate 61   Neuro/Psych negative neurological ROS  negative psych ROS   GI/Hepatic Neg liver ROS, GERD  Medicated and Controlled,  Endo/Other  diabetes, Well Controlled, Insulin Dependent  Renal/GU Renal InsufficiencyRenal disease  negative genitourinary   Musculoskeletal negative musculoskeletal ROS (+)   Abdominal   Peds negative pediatric ROS (+)  Hematology  (+) anemia ,   Anesthesia Other Findings osteomyelitis  Reproductive/Obstetrics negative OB ROS                           Anesthesia Physical Anesthesia Plan  ASA: III  Anesthesia Plan: MAC   Post-op Pain Management:    Induction: Intravenous  Airway Management Planned:   Additional Equipment:   Intra-op Plan:   Post-operative Plan:   Informed Consent: I have reviewed the patients History and Physical, chart, labs and discussed the procedure including the risks, benefits and alternatives for the proposed anesthesia with the patient or authorized representative who has indicated his/her understanding and acceptance.   Dental advisory given  Plan Discussed with: CRNA and Surgeon  Anesthesia Plan Comments:         Anesthesia Quick Evaluation

## 2016-09-01 NOTE — Brief Op Note (Signed)
09/01/2016  9:35 AM  PATIENT:  Rhett Bannister  76 y.o. male  PRE-OPERATIVE DIAGNOSIS:  osteomyelitis right middle finger  POST-OPERATIVE DIAGNOSIS:  same  PROCEDURE:  Procedure(s): AMPUTATION RIGHT MIDDLE FINGER TIP (Right)  SURGEON:  Surgeon(s) and Role:    * Mcarthur Rossetti, MD - Primary  PHYSICIAN ASSISTANT: Benita Stabile, PA-C  ANESTHESIA:   local and IV sedation  COUNTS:  YES  DICTATION: .Other Dictation: Dictation Number [id number]  PLAN OF CARE: Discharge to home after PACU  PATIENT DISPOSITION:  PACU - hemodynamically stable.   Delay start of Pharmacological VTE agent (>24hrs) due to surgical blood loss or risk of bleeding: no

## 2016-09-01 NOTE — H&P (Signed)
Jacob Barnes is an 76 y.o. male.   Chief Complaint:   Right middle finger infection HPI:   76 yo diabetic male with an infection involving his right middle finger tip.  X-rays show evidence of osteomyelitis.  We have recommended an amputation of his right middle finger near the end of the finger.  He understands this full and wishes to proceed.  Past Medical History:  Diagnosis Date  . Abrasion of right middle finger with infection    for 1 week,   . Anemia   . Arthritis    hands  . Diabetes mellitus    says since 1979 type 2  . GERD (gastroesophageal reflux disease)   . Glaucoma    both eyes  . History of kidney stones   . Hyperlipemia   . Hypertension   . Rash    front abdomen no drainage    Past Surgical History:  Procedure Laterality Date  . APPENDECTOMY    . COLONOSCOPY  09/06/2011   Procedure: COLONOSCOPY;  Surgeon: Rogene Houston, MD;  [facility]: [facility];  Service: Endoscopy;  Laterality: N/A;  830  . CYSTOSCOPY/RETROGRADE/URETEROSCOPY/STONE EXTRACTION WITH BASKET    . ESOPHAGOGASTRODUODENOSCOPY N/A 01/15/2014   Procedure: ESOPHAGOGASTRODUODENOSCOPY (EGD);  Surgeon: Rogene Houston, MD;  [facility]: [facility];  Service: Endoscopy;  Laterality: N/A;  200  . ESOPHAGOGASTRODUODENOSCOPY N/A 04/22/2014   Procedure: ESOPHAGOGASTRODUODENOSCOPY (EGD);  Surgeon: Rogene Houston, MD;  [facility]: [facility];  Service: Endoscopy;  Laterality: N/A;  240  . EYE SURGERY Bilateral yrs ago   ioc for cataract  . MALONEY DILATION N/A 01/15/2014   Procedure: MALONEY DILATION;  Surgeon: Rogene Houston, MD;  Location: [facility];  Service: Endoscopy;  Laterality: N/A;  . MALONEY DILATION  04/22/2014   Procedure: Venia Minks DILATION;  Surgeon: Rogene Houston, MD;  Location: [facility];  Service: Endoscopy;;  . surgery for left eye bleedf  2017    Family History  Problem Relation Age of Onset  . Diabetes Mother   . Diabetes Father   . Heart disease Father   . Diabetes Brother     . Diabetes Brother   . Diabetes Son    Social History:  reports that he has never smoked. He has never used smokeless tobacco. He reports that he does not drink alcohol or use drugs.  Allergies: No Known Allergies  No prescriptions prior to admission.    Results for orders placed or performed during the hospital encounter of 08/30/16 (from the past 48 hour(s))  Glucose, capillary     Status: None   Collection Time: 08/30/16 11:00 AM  Result Value Ref Range   Glucose-Capillary 81 65 - 99 mg/dL  Basic metabolic panel     Status: Abnormal   Collection Time: 08/30/16 11:26 AM  Result Value Ref Range   Sodium 133 (L) 135 - 145 mmol/L   Potassium 5.3 (H) 3.5 - 5.1 mmol/L   Chloride 102 101 - 111 mmol/L   CO2 26 22 - 32 mmol/L   Glucose, Bld 75 65 - 99 mg/dL   BUN 23 (H) 6 - 20 mg/dL   Creatinine, Ser 1.66 (H) 0.61 - 1.24 mg/dL   Calcium 8.9 8.9 - 10.3 mg/dL   GFR calc non Af Amer 38 (L) >60 mL/min   GFR calc Af Amer 44 (L) >60 mL/min    Comment: (NOTE) The eGFR has been calculated using the CKD EPI equation. This calculation has not been  validated in all clinical situations. eGFR's persistently <60 mL/min signify possible Chronic Kidney Disease.    Anion gap 5 5 - 15  Hemoglobin A1c     Status: Abnormal   Collection Time: 08/30/16 11:26 AM  Result Value Ref Range   Hgb A1c MFr Bld 10.1 (H) 4.8 - 5.6 %    Comment: (NOTE)         Pre-diabetes: 5.7 - 6.4         Diabetes: >6.4         Glycemic control for adults with diabetes: <7.0    Mean Plasma Glucose 243 mg/dL    Comment: (NOTE) Performed At: [facility] [place], [place] [id number] Lindon Romp MD [id number]   CBC     Status: Abnormal   Collection Time: 08/30/16 11:26 AM  Result Value Ref Range   WBC 4.8 4.0 - 10.5 K/uL   RBC 3.85 (L) 4.22 - 5.81 MIL/uL   Hemoglobin 11.7 (L) 13.0 - 17.0 g/dL   HCT 35.3 (L) 39.0 - 52.0 %   MCV 91.7 78.0 - 100.0 fL   MCH 30.4 26.0 - 34.0 pg   MCHC  33.1 30.0 - 36.0 g/dL   RDW 14.3 11.5 - 15.5 %   Platelets 256 150 - 400 K/uL   No results found.  Review of Systems  All other systems reviewed and are negative.   There were no vitals taken for this visit. Physical Exam  Constitutional: He is oriented to person, place, and time. He appears well-developed and well-nourished.  HENT:  Head: Normocephalic and atraumatic.  Eyes: EOM are normal. Pupils are equal, round, and reactive to light.  Neck: Normal range of motion. Neck supple.  Cardiovascular: Normal rate and regular rhythm.   Respiratory: Effort normal and breath sounds normal.  GI: Soft. Bowel sounds are normal.  Musculoskeletal:       Hands: Neurological: He is alert and oriented to person, place, and time.  Skin: Skin is warm and dry.  Psychiatric: He has a normal mood and affect.     Assessment/Plan Right middle finger with osteomyelitis  1) To the OR today for an amputation of his right middle finger distally.  He understands fully the risks and benefits involved and informed consent is obtained.    Mcarthur Rossetti, MD 09/01/2016, 7:17 AM

## 2016-09-05 ENCOUNTER — Encounter (HOSPITAL_COMMUNITY): Payer: Self-pay | Admitting: Orthopaedic Surgery

## 2016-09-11 ENCOUNTER — Encounter (HOSPITAL_COMMUNITY): Payer: Self-pay | Admitting: Orthopaedic Surgery

## 2016-09-14 ENCOUNTER — Ambulatory Visit (INDEPENDENT_AMBULATORY_CARE_PROVIDER_SITE_OTHER): Payer: Medicare HMO | Admitting: Physician Assistant

## 2016-09-14 DIAGNOSIS — M869 Osteomyelitis, unspecified: Secondary | ICD-10-CM

## 2016-09-14 NOTE — Progress Notes (Signed)
[name] returns today 2 weeks status post amputation of his right middle finger tip. He is overall doing well and denies any fevers chills. Right middle finger sutures well approximated incision. There is no expressible purulence. No gross signs of infection. No malodor  Impression : 2 weeks status post amputation of right middle through the middle phalanx.  Plan: We'll have him keep the sutures intact for another week see him back in a week at that time most likely will remove the sutures. In the interim he will change the dressing every other day and apply Xeroform and gauze and Coban. He can get the incision wet but not so finger. He's to dry the foot completely before applying the new bandage.

## 2016-09-21 ENCOUNTER — Telehealth: Payer: Self-pay | Admitting: Family Medicine

## 2016-09-21 ENCOUNTER — Other Ambulatory Visit: Payer: Self-pay

## 2016-09-21 ENCOUNTER — Ambulatory Visit (INDEPENDENT_AMBULATORY_CARE_PROVIDER_SITE_OTHER): Payer: Medicare HMO | Admitting: Physician Assistant

## 2016-09-21 DIAGNOSIS — E1065 Type 1 diabetes mellitus with hyperglycemia: Principal | ICD-10-CM

## 2016-09-21 DIAGNOSIS — E1022 Type 1 diabetes mellitus with diabetic chronic kidney disease: Secondary | ICD-10-CM

## 2016-09-21 DIAGNOSIS — M869 Osteomyelitis, unspecified: Secondary | ICD-10-CM

## 2016-09-21 MED ORDER — BLOOD GLUCOSE METER KIT: PACK | 0 refills | Status: DC

## 2016-09-21 MED ORDER — INSULIN GLARGINE 100 UNIT/ML SOLOSTAR PEN: 20.0000 [IU] | PEN_INJECTOR | Freq: Every day | SUBCUTANEOUS | 1 refills | Status: DC

## 2016-09-21 NOTE — Progress Notes (Signed)
Jacob Barnes returns today follow-up of his right middle finger amputation through the middle phalanx. Overall doing well. He is had no fevers chills has no concerns..... Marland Kitchen Physical exam right middle finger sutures well approximated incision. There is slight dehiscence over the ulnar side of the incision. No signs of gross infection. No abnormal warmth erythema.  Plan we'll continue the sutures for another week. He can wash the hand for gentle hygiene. No soaking. See back in 1 week at that time we'll remove the sutures.

## 2016-09-21 NOTE — Telephone Encounter (Signed)
Patient's sister is requesting that we order [employer] diabetic medical supplies thru mail order [facility].  He will need a insulin, a new meter and strips. She is not sure how to go about this, but was told to contact the doctor.   Devern Alford Highland (pt's sister)   cb  [phone]

## 2016-09-22 NOTE — Telephone Encounter (Signed)
Sent to [employer].

## 2016-09-25 ENCOUNTER — Telehealth (INDEPENDENT_AMBULATORY_CARE_PROVIDER_SITE_OTHER): Payer: Self-pay

## 2016-09-25 NOTE — Telephone Encounter (Signed)
appt made for tomorrow

## 2016-09-25 NOTE — Telephone Encounter (Signed)
Patient's sister called stating that patient's middle finger on his right hand is not looking good.  States he is having some bleeding that is coming through the bandage, finger is raw, and his finger looks worse now than it did last week. Stitches have come out on one side.CB#  [phone].  Please advise.

## 2016-09-26 ENCOUNTER — Encounter (INDEPENDENT_AMBULATORY_CARE_PROVIDER_SITE_OTHER): Payer: Self-pay | Admitting: Orthopaedic Surgery

## 2016-09-26 ENCOUNTER — Ambulatory Visit (INDEPENDENT_AMBULATORY_CARE_PROVIDER_SITE_OTHER): Payer: Medicare HMO | Admitting: Orthopaedic Surgery

## 2016-09-26 VITALS — Ht 65.0 in | Wt 120.0 lb

## 2016-09-26 DIAGNOSIS — M869 Osteomyelitis, unspecified: Secondary | ICD-10-CM

## 2016-09-26 NOTE — Progress Notes (Signed)
The patient is status post a right middle fingertip amputation secondary to osteomyelitis. He is coming in today for continued follow-up. On examination suture lines intact remove the sutures and did get significant bleeding from this. There is no evidence infection. I placed Xeroform a well-padded compressive dressing. I will have him leave this dressing on for the next 5 days. I then gave the family instructions about removing the dressing placement replan about ointment on the finger daily with new dry dressing daily. We'll see him back in 2 weeks to see how is doing overall.

## 2016-09-28 ENCOUNTER — Ambulatory Visit (INDEPENDENT_AMBULATORY_CARE_PROVIDER_SITE_OTHER): Payer: Medicare HMO | Admitting: Physician Assistant

## 2016-10-05 ENCOUNTER — Ambulatory Visit (INDEPENDENT_AMBULATORY_CARE_PROVIDER_SITE_OTHER): Payer: Medicare HMO | Admitting: Orthopaedic Surgery

## 2016-10-05 DIAGNOSIS — M869 Osteomyelitis, unspecified: Secondary | ICD-10-CM

## 2016-10-05 MED ORDER — SULFAMETHOXAZOLE-TRIMETHOPRIM 800-160 MG PO TABS: 1.0000 | ORAL_TABLET | Freq: Two times a day (BID) | ORAL | 0 refills | Status: DC

## 2016-10-05 NOTE — Progress Notes (Signed)
The patient is well-known to me. He is a 76 year old who we took to the operating room a month ago for amputation of her right hand middle finger at the tip of the finger due to osteomyelitis. We had to go through the middle phalanx. Since then we will follow him closely for for wound that is developed postoperatively.  In the office today there is necrotic area with purulent drainage at the end of his finger.  At this point I do feel that he needs a revision surgery on his right hand. This would be a repeat irrigation and debridement of the middle finger with likely amputation to the proximal phalanx versus the MCP joint. I talked to his family and him in length about this. He'll continue antibiotics and soaks in the interim I will get him set up for surgery for early next week. All questions were encouraged and answered.

## 2016-10-06 DIAGNOSIS — R69 Illness, unspecified: Secondary | ICD-10-CM | POA: Diagnosis not present

## 2016-10-09 ENCOUNTER — Other Ambulatory Visit (INDEPENDENT_AMBULATORY_CARE_PROVIDER_SITE_OTHER): Payer: Self-pay | Admitting: Orthopaedic Surgery

## 2016-10-09 ENCOUNTER — Encounter (HOSPITAL_COMMUNITY): Payer: Self-pay | Admitting: *Deleted

## 2016-10-09 DIAGNOSIS — H5203 Hypermetropia, bilateral: Secondary | ICD-10-CM | POA: Diagnosis not present

## 2016-10-09 DIAGNOSIS — H52223 Regular astigmatism, bilateral: Secondary | ICD-10-CM | POA: Diagnosis not present

## 2016-10-09 DIAGNOSIS — H31003 Unspecified chorioretinal scars, bilateral: Secondary | ICD-10-CM | POA: Diagnosis not present

## 2016-10-09 DIAGNOSIS — E113593 Type 2 diabetes mellitus with proliferative diabetic retinopathy without macular edema, bilateral: Secondary | ICD-10-CM | POA: Diagnosis not present

## 2016-10-09 DIAGNOSIS — H47233 Glaucomatous optic atrophy, bilateral: Secondary | ICD-10-CM | POA: Diagnosis not present

## 2016-10-09 DIAGNOSIS — L089 Local infection of the skin and subcutaneous tissue, unspecified: Secondary | ICD-10-CM

## 2016-10-09 DIAGNOSIS — Z961 Presence of intraocular lens: Secondary | ICD-10-CM | POA: Diagnosis not present

## 2016-10-09 DIAGNOSIS — H524 Presbyopia: Secondary | ICD-10-CM | POA: Diagnosis not present

## 2016-10-09 LAB — HM DIABETES EYE EXAM

## 2016-10-09 NOTE — Progress Notes (Signed)
I spoke with patient's sister, Rocco Pauls, patient has a history of Type II DM.  "CBG  Are  Up and down 60- 200's.  I instructed Mrs Deno Etienne to have patient take only 1/2 dose of Lantus tonight.  Check CBG in am and every 2 hours until he arrives at the hospital. Mrs Alford Highland reports that patient was instructed that he could eat until 0900.  I encouraged her to tell patient to not eat greasy foods in am.  I instructed Mrs Alford Highland that if CBG is less than 70 for patient to drink 1/2 cup of clear juice, then recheck CBG in 15 minutes and call pre- op desk. Mrs Alford Highland correctly re read instructions to me.    Mrs Alford Highland reports that patient does not complain of chest pain or shortness of breath.

## 2016-10-10 ENCOUNTER — Ambulatory Visit (INDEPENDENT_AMBULATORY_CARE_PROVIDER_SITE_OTHER): Payer: Medicare HMO | Admitting: Orthopaedic Surgery

## 2016-10-10 ENCOUNTER — Ambulatory Visit (HOSPITAL_COMMUNITY): Payer: Medicare HMO | Admitting: Anesthesiology

## 2016-10-10 ENCOUNTER — Encounter (HOSPITAL_COMMUNITY): Payer: Self-pay

## 2016-10-10 ENCOUNTER — Observation Stay (HOSPITAL_COMMUNITY)
Admission: RE | Admit: 2016-10-10 | Discharge: 2016-10-11 | Disposition: A | Discharge status: Home / Self Care | Payer: Medicare HMO | Source: Ambulatory Visit | Attending: Orthopaedic Surgery | Admitting: Orthopaedic Surgery

## 2016-10-10 ENCOUNTER — Encounter (HOSPITAL_COMMUNITY)
Admission: RE | Disposition: A | Discharge status: Home / Self Care | Payer: Self-pay | Source: Ambulatory Visit | Attending: Orthopaedic Surgery

## 2016-10-10 DIAGNOSIS — E1122 Type 2 diabetes mellitus with diabetic chronic kidney disease: Secondary | ICD-10-CM | POA: Diagnosis not present

## 2016-10-10 DIAGNOSIS — Z9889 Other specified postprocedural states: Secondary | ICD-10-CM | POA: Insufficient documentation

## 2016-10-10 DIAGNOSIS — Z794 Long term (current) use of insulin: Secondary | ICD-10-CM | POA: Diagnosis not present

## 2016-10-10 DIAGNOSIS — N189 Chronic kidney disease, unspecified: Secondary | ICD-10-CM | POA: Diagnosis not present

## 2016-10-10 DIAGNOSIS — K219 Gastro-esophageal reflux disease without esophagitis: Secondary | ICD-10-CM | POA: Insufficient documentation

## 2016-10-10 DIAGNOSIS — D631 Anemia in chronic kidney disease: Secondary | ICD-10-CM | POA: Diagnosis not present

## 2016-10-10 DIAGNOSIS — H409 Unspecified glaucoma: Secondary | ICD-10-CM | POA: Diagnosis not present

## 2016-10-10 DIAGNOSIS — Z87442 Personal history of urinary calculi: Secondary | ICD-10-CM | POA: Insufficient documentation

## 2016-10-10 DIAGNOSIS — Z9049 Acquired absence of other specified parts of digestive tract: Secondary | ICD-10-CM | POA: Insufficient documentation

## 2016-10-10 DIAGNOSIS — E1169 Type 2 diabetes mellitus with other specified complication: Secondary | ICD-10-CM | POA: Diagnosis not present

## 2016-10-10 DIAGNOSIS — S68112A Complete traumatic metacarpophalangeal amputation of right middle finger, initial encounter: Secondary | ICD-10-CM | POA: Diagnosis present

## 2016-10-10 DIAGNOSIS — Z79891 Long term (current) use of opiate analgesic: Secondary | ICD-10-CM | POA: Diagnosis not present

## 2016-10-10 DIAGNOSIS — I129 Hypertensive chronic kidney disease with stage 1 through stage 4 chronic kidney disease, or unspecified chronic kidney disease: Secondary | ICD-10-CM | POA: Insufficient documentation

## 2016-10-10 DIAGNOSIS — M869 Osteomyelitis, unspecified: Secondary | ICD-10-CM | POA: Diagnosis not present

## 2016-10-10 DIAGNOSIS — Z79899 Other long term (current) drug therapy: Secondary | ICD-10-CM | POA: Diagnosis not present

## 2016-10-10 DIAGNOSIS — E785 Hyperlipidemia, unspecified: Secondary | ICD-10-CM | POA: Diagnosis not present

## 2016-10-10 DIAGNOSIS — N183 Chronic kidney disease, stage 3 (moderate): Secondary | ICD-10-CM | POA: Diagnosis not present

## 2016-10-10 DIAGNOSIS — Z8249 Family history of ischemic heart disease and other diseases of the circulatory system: Secondary | ICD-10-CM | POA: Insufficient documentation

## 2016-10-10 DIAGNOSIS — Z833 Family history of diabetes mellitus: Secondary | ICD-10-CM | POA: Insufficient documentation

## 2016-10-10 DIAGNOSIS — L089 Local infection of the skin and subcutaneous tissue, unspecified: Secondary | ICD-10-CM | POA: Insufficient documentation

## 2016-10-10 HISTORY — DX: Chronic kidney disease, unspecified: N18.9

## 2016-10-10 HISTORY — DX: Complete traumatic metacarpophalangeal amputation of right middle finger, initial encounter: S68.112A

## 2016-10-10 HISTORY — PX: AMPUTATION: SHX166

## 2016-10-10 LAB — CBC
HEMATOCRIT: 28.2 % — AB (ref 39.0–52.0)
HEMOGLOBIN: 9.1 g/dL — AB (ref 13.0–17.0)
MCH: 29.5 pg (ref 26.0–34.0)
MCHC: 32.3 g/dL (ref 30.0–36.0)
MCV: 91.6 fL (ref 78.0–100.0)
Platelets: 229 10*3/uL (ref 150–400)
RBC: 3.08 MIL/uL — AB (ref 4.22–5.81)
RDW: 14.2 % (ref 11.5–15.5)
WBC: 4.3 10*3/uL (ref 4.0–10.5)

## 2016-10-10 LAB — GLUCOSE, CAPILLARY
GLUCOSE-CAPILLARY: 83 mg/dL (ref 65–99)
Glucose-Capillary: 122 mg/dL — ABNORMAL HIGH (ref 65–99)
Glucose-Capillary: 57 mg/dL — ABNORMAL LOW (ref 65–99)
Glucose-Capillary: 108 mg/dL — ABNORMAL HIGH (ref 65–99)
Glucose-Capillary: 58 mg/dL — ABNORMAL LOW (ref 65–99)
Glucose-Capillary: 105 mg/dL — ABNORMAL HIGH (ref 65–99)
Glucose-Capillary: 56 mg/dL — ABNORMAL LOW (ref 65–99)

## 2016-10-10 LAB — BASIC METABOLIC PANEL
ANION GAP: 7 (ref 5–15)
BUN: 15 mg/dL (ref 6–20)
CALCIUM: 8.6 mg/dL — AB (ref 8.9–10.3)
CHLORIDE: 103 mmol/L (ref 101–111)
CO2: 23 mmol/L (ref 22–32)
Creatinine, Ser: 1.51 mg/dL — ABNORMAL HIGH (ref 0.61–1.24)
GFR calc non Af Amer: 43 mL/min — ABNORMAL LOW (ref >60)
GFR, EST AFRICAN AMERICAN: 50 mL/min — AB (ref >60)
GLUCOSE: 86 mg/dL (ref 65–99)
POTASSIUM: 4.6 mmol/L (ref 3.5–5.1)
Sodium: 133 mmol/L — ABNORMAL LOW (ref 135–145)

## 2016-10-10 SURGERY — AMPUTATION DIGIT
Anesthesia: Monitor Anesthesia Care | Laterality: Right

## 2016-10-10 MED ORDER — PROMETHAZINE HCL 25 MG/ML IJ SOLN: 6.2500 mg | INTRAMUSCULAR | Status: DC | PRN

## 2016-10-10 MED ORDER — DIPHENHYDRAMINE HCL 12.5 MG/5ML PO ELIX: 12.5000 mg | ORAL_SOLUTION | ORAL | Status: DC | PRN

## 2016-10-10 MED ORDER — FENTANYL CITRATE (PF) 250 MCG/5ML IJ SOLN
INTRAMUSCULAR | Status: AC
  Filled 2016-10-10: qty 5

## 2016-10-10 MED ORDER — ACETAMINOPHEN 325 MG PO TABS: 650.0000 mg | ORAL_TABLET | Freq: Four times a day (QID) | ORAL | Status: DC | PRN

## 2016-10-10 MED ORDER — FENTANYL CITRATE (PF) 100 MCG/2ML IJ SOLN: 25.0000 ug | INTRAMUSCULAR | Status: DC | PRN

## 2016-10-10 MED ORDER — 0.9 % SODIUM CHLORIDE (POUR BTL) OPTIME
TOPICAL | Status: DC | PRN
  Administered 2016-10-10: 1000 mL

## 2016-10-10 MED ORDER — PROPOFOL 10 MG/ML IV BOLUS
INTRAVENOUS | Status: AC
  Filled 2016-10-10: qty 20

## 2016-10-10 MED ORDER — LACTATED RINGERS IV SOLN
INTRAVENOUS | Status: DC | PRN
  Administered 2016-10-10: 18:00:00 via INTRAVENOUS

## 2016-10-10 MED ORDER — OXYCODONE HCL 5 MG PO TABS: 5.0000 mg | ORAL_TABLET | Freq: Once | ORAL | Status: DC | PRN

## 2016-10-10 MED ORDER — INSULIN GLARGINE 100 UNIT/ML ~~LOC~~ SOLN: 20.0000 [IU] | Freq: Every day | SUBCUTANEOUS | Status: DC

## 2016-10-10 MED ORDER — SODIUM CHLORIDE 0.9 % IV SOLN
INTRAVENOUS | Status: DC
  Administered 2016-10-10: 23:00:00 via INTRAVENOUS

## 2016-10-10 MED ORDER — FERROUS SULFATE 325 (65 FE) MG PO TABS
325.0000 mg | ORAL_TABLET | Freq: Every day | ORAL | Status: DC
  Administered 2016-10-11: 325 mg via ORAL
  Filled 2016-10-10: qty 1

## 2016-10-10 MED ORDER — GLUCOSE 40 % PO GEL
1.0000 | Freq: Once | ORAL | Status: AC
Start: 2016-10-10 — End: 2016-10-10
  Administered 2016-10-10: 37.5 g via ORAL

## 2016-10-10 MED ORDER — OXYCODONE HCL 5 MG PO TABS: 5.0000 mg | ORAL_TABLET | ORAL | Status: DC | PRN

## 2016-10-10 MED ORDER — METOCLOPRAMIDE HCL 5 MG/ML IJ SOLN: 5.0000 mg | Freq: Three times a day (TID) | INTRAMUSCULAR | Status: DC | PRN

## 2016-10-10 MED ORDER — ACETAMINOPHEN 650 MG RE SUPP: 650.0000 mg | Freq: Four times a day (QID) | RECTAL | Status: DC | PRN

## 2016-10-10 MED ORDER — ONDANSETRON HCL 4 MG PO TABS: 4.0000 mg | ORAL_TABLET | Freq: Four times a day (QID) | ORAL | Status: DC | PRN

## 2016-10-10 MED ORDER — BUPIVACAINE HCL (PF) 0.25 % IJ SOLN
INTRAMUSCULAR | Status: DC | PRN
  Administered 2016-10-10: 5 mL

## 2016-10-10 MED ORDER — PANTOPRAZOLE SODIUM 40 MG PO TBEC
40.0000 mg | DELAYED_RELEASE_TABLET | Freq: Every day | ORAL | Status: DC
  Administered 2016-10-11: 40 mg via ORAL
  Filled 2016-10-10 (×2): qty 1

## 2016-10-10 MED ORDER — DEXTROSE 50 % IV SOLN
INTRAVENOUS | Status: AC
  Administered 2016-10-10: 12.5 g via INTRAVENOUS
  Filled 2016-10-10: qty 50

## 2016-10-10 MED ORDER — OXYCODONE HCL 5 MG/5ML PO SOLN: 5.0000 mg | Freq: Once | ORAL | Status: DC | PRN

## 2016-10-10 MED ORDER — CEFAZOLIN SODIUM-DEXTROSE 2-4 GM/100ML-% IV SOLN
2.0000 g | INTRAVENOUS | Status: AC
  Administered 2016-10-10: 2 g via INTRAVENOUS
  Filled 2016-10-10: qty 100

## 2016-10-10 MED ORDER — LIDOCAINE 2% (20 MG/ML) 5 ML SYRINGE
INTRAMUSCULAR | Status: DC | PRN
  Administered 2016-10-10: 60 mg via INTRAVENOUS

## 2016-10-10 MED ORDER — PROPOFOL 500 MG/50ML IV EMUL
INTRAVENOUS | Status: DC | PRN
  Administered 2016-10-10: 100 ug/kg/min via INTRAVENOUS

## 2016-10-10 MED ORDER — LIDOCAINE HCL (CARDIAC) 20 MG/ML IV SOLN
INTRAVENOUS | Status: AC
  Filled 2016-10-10: qty 20

## 2016-10-10 MED ORDER — FENTANYL CITRATE (PF) 250 MCG/5ML IJ SOLN
INTRAMUSCULAR | Status: DC | PRN
Start: 2016-10-10 — End: 2016-10-10
  Administered 2016-10-10: 50 ug via INTRAVENOUS

## 2016-10-10 MED ORDER — EPHEDRINE 5 MG/ML INJ
INTRAVENOUS | Status: AC
  Filled 2016-10-10: qty 30

## 2016-10-10 MED ORDER — ONDANSETRON HCL 4 MG/2ML IJ SOLN: 4.0000 mg | Freq: Four times a day (QID) | INTRAMUSCULAR | Status: DC | PRN

## 2016-10-10 MED ORDER — TIMOLOL MALEATE 0.5 % OP SOLN
1.0000 [drp] | Freq: Every day | OPHTHALMIC | Status: DC
  Administered 2016-10-11: 1 [drp] via OPHTHALMIC
  Filled 2016-10-10: qty 5

## 2016-10-10 MED ORDER — HYDROCODONE-ACETAMINOPHEN 5-325 MG PO TABS: 1.0000 | ORAL_TABLET | ORAL | Status: DC | PRN

## 2016-10-10 MED ORDER — METOCLOPRAMIDE HCL 5 MG PO TABS: 5.0000 mg | ORAL_TABLET | Freq: Three times a day (TID) | ORAL | Status: DC | PRN

## 2016-10-10 MED ORDER — BUPIVACAINE HCL (PF) 0.25 % IJ SOLN
INTRAMUSCULAR | Status: AC
  Filled 2016-10-10: qty 30

## 2016-10-10 MED ORDER — CEFAZOLIN SODIUM-DEXTROSE 1-4 GM/50ML-% IV SOLN
1.0000 g | Freq: Four times a day (QID) | INTRAVENOUS | Status: AC
  Administered 2016-10-11 (×3): 1 g via INTRAVENOUS
  Filled 2016-10-10 (×3): qty 50

## 2016-10-10 MED ORDER — PHENYLEPHRINE 40 MCG/ML (10ML) SYRINGE FOR IV PUSH (FOR BLOOD PRESSURE SUPPORT)
PREFILLED_SYRINGE | INTRAVENOUS | Status: AC
  Filled 2016-10-10: qty 30

## 2016-10-10 MED ORDER — SULFAMETHOXAZOLE-TRIMETHOPRIM 800-160 MG PO TABS
1.0000 | ORAL_TABLET | Freq: Two times a day (BID) | ORAL | Status: DC
  Administered 2016-10-10 – 2016-10-11 (×2): 1 via ORAL
  Filled 2016-10-10 (×2): qty 1

## 2016-10-10 MED ORDER — DEXTROSE 50 % IV SOLN
25.0000 g | Freq: Once | INTRAVENOUS | Status: AC
  Administered 2016-10-10: 12.5 g via INTRAVENOUS
  Filled 2016-10-10: qty 50

## 2016-10-10 SURGICAL SUPPLY — 50 items
BANDAGE GAUZE 4  KLING STR (GAUZE/BANDAGES/DRESSINGS) IMPLANT
BLADE AVERAGE 25X9 (BLADE) IMPLANT
BLADE MINI RND TIP GREEN BEAV (BLADE) IMPLANT
BNDG COHESIVE 1X5 TAN STRL LF (GAUZE/BANDAGES/DRESSINGS) ×2 IMPLANT
BNDG COHESIVE 3X5 TAN STRL LF (GAUZE/BANDAGES/DRESSINGS) ×2 IMPLANT
BNDG COHESIVE 6X5 TAN STRL LF (GAUZE/BANDAGES/DRESSINGS) IMPLANT
BNDG CONFORM 2 STRL LF (GAUZE/BANDAGES/DRESSINGS) ×4 IMPLANT
BNDG ESMARK 4X9 LF (GAUZE/BANDAGES/DRESSINGS) IMPLANT
BNDG GAUZE STRTCH 6 (GAUZE/BANDAGES/DRESSINGS) IMPLANT
CORDS BIPOLAR (ELECTRODE) ×2 IMPLANT
COVER SURGICAL LIGHT HANDLE (MISCELLANEOUS) ×2 IMPLANT
CUFF TOURNIQUET SINGLE 18IN (TOURNIQUET CUFF) IMPLANT
CUFF TOURNIQUET SINGLE 24IN (TOURNIQUET CUFF) IMPLANT
CUFF TOURNIQUET SINGLE 34IN LL (TOURNIQUET CUFF) IMPLANT
DRAPE U-SHAPE 47X51 STRL (DRAPES) ×2 IMPLANT
DURAPREP 26ML APPLICATOR (WOUND CARE) ×2 IMPLANT
ELECT REM PT RETURN 9FT ADLT (ELECTROSURGICAL)
ELECTRODE REM PT RTRN 9FT ADLT (ELECTROSURGICAL) IMPLANT
GAUZE SPONGE 2X2 8PLY STRL LF (GAUZE/BANDAGES/DRESSINGS) IMPLANT
GAUZE SPONGE 4X4 12PLY STRL (GAUZE/BANDAGES/DRESSINGS) IMPLANT
GAUZE XEROFORM 1X8 LF (GAUZE/BANDAGES/DRESSINGS) ×2 IMPLANT
GLOVE BIO SURGEON STRL SZ8 (GLOVE) ×2 IMPLANT
GLOVE BIOGEL PI IND STRL 8 (GLOVE) ×1 IMPLANT
GLOVE BIOGEL PI INDICATOR 8 (GLOVE) ×1
GLOVE ORTHO TXT STRL SZ7.5 (GLOVE) ×2 IMPLANT
GOWN STRL REUS W/ TWL LRG LVL3 (GOWN DISPOSABLE) ×1 IMPLANT
GOWN STRL REUS W/ TWL XL LVL3 (GOWN DISPOSABLE) ×1 IMPLANT
GOWN STRL REUS W/TWL LRG LVL3 (GOWN DISPOSABLE) ×1
GOWN STRL REUS W/TWL XL LVL3 (GOWN DISPOSABLE) ×1
KIT BASIN OR (CUSTOM PROCEDURE TRAY) ×2 IMPLANT
KIT ROOM TURNOVER OR (KITS) ×2 IMPLANT
MANIFOLD NEPTUNE II (INSTRUMENTS) IMPLANT
NEEDLE HYPO 25GX1X1/2 BEV (NEEDLE) IMPLANT
NS IRRIG 1000ML POUR BTL (IV SOLUTION) ×2 IMPLANT
PACK ORTHO EXTREMITY (CUSTOM PROCEDURE TRAY) ×2 IMPLANT
PAD ARMBOARD 7.5X6 YLW CONV (MISCELLANEOUS) ×4 IMPLANT
PAD CAST 4YDX4 CTTN HI CHSV (CAST SUPPLIES) IMPLANT
PADDING CAST COTTON 4X4 STRL (CAST SUPPLIES)
SPECIMEN JAR SMALL (MISCELLANEOUS) IMPLANT
SPONGE GAUZE 2X2 STER 10/PKG (GAUZE/BANDAGES/DRESSINGS)
SUCTION FRAZIER HANDLE 10FR (MISCELLANEOUS)
SUCTION TUBE FRAZIER 10FR DISP (MISCELLANEOUS) IMPLANT
SUT ETHILON 2 0 FS 18 (SUTURE) IMPLANT
SUT ETHILON 3 0 PS 1 (SUTURE) ×4 IMPLANT
SUT VIC AB 2-0 FS1 27 (SUTURE) IMPLANT
SYR CONTROL 10ML LL (SYRINGE) IMPLANT
TOWEL OR 17X24 6PK STRL BLUE (TOWEL DISPOSABLE) ×2 IMPLANT
TOWEL OR 17X26 10 PK STRL BLUE (TOWEL DISPOSABLE) ×2 IMPLANT
TUBE CONNECTING 12X1/4 (SUCTIONS) IMPLANT
WATER STERILE IRR 1000ML POUR (IV SOLUTION) ×2 IMPLANT

## 2016-10-10 NOTE — Progress Notes (Signed)
Hypoglycemic Event  CBG: 58  Treatment: 15 GM carbohydrate snack  Symptoms: None  Follow-up CBG: Time:2243 CBG Result:56  Possible Reasons for Event: Inadequate meal intake  Comments/MD notified:Called Dr. Trevor Mace after hours number left a message with receptionist     [place] R Ottis Vacha

## 2016-10-10 NOTE — Brief Op Note (Signed)
10/10/2016  6:56 PM  PATIENT:  Jacob Barnes  76 y.o. male  PRE-OPERATIVE DIAGNOSIS:  infection right middle finger  POST-OPERATIVE DIAGNOSIS:  infection right middle finger  PROCEDURE:  Procedure(s): REPEAT IRRIGATION AND DEBRIDEMENT RIGHT MIDDLE FINGER WITH AMPUTATION THROUGH PROXIMAL PHALANX (Right)  SURGEON:  Surgeon(s) and Role:    Mcarthur Rossetti, MD - Primary  ANESTHESIA:   local and IV sedation  EBL:  Total I/O In: 500 [I.V.:500] Out: 5 [Blood:5]  COUNTS:  YES  TOURNIQUET:  none  DICTATION: .Other Dictation: Dictation Number [id number]  PLAN OF CARE: Admit for overnight observation  PATIENT DISPOSITION:  PACU - hemodynamically stable.   Delay start of Pharmacological VTE agent (>24hrs) due to surgical blood loss or risk of bleeding: no

## 2016-10-10 NOTE — Anesthesia Preprocedure Evaluation (Addendum)
Anesthesia Evaluation  Patient identified by MRN, date of birth, ID band Patient awake    Reviewed: Allergy & Precautions, NPO status , Patient's Chart, lab work & pertinent test results  Airway Mallampati: II  TM Distance: >3 FB Neck ROM: Full    Dental no notable dental hx.    Pulmonary neg pulmonary ROS,    Pulmonary exam normal breath sounds clear to auscultation       Cardiovascular hypertension, Normal cardiovascular exam Rhythm:Regular Rate:Normal     Neuro/Psych negative neurological ROS  negative psych ROS   GI/Hepatic Neg liver ROS, GERD  ,  Endo/Other  diabetes  Renal/GU negative Renal ROS  negative genitourinary   Musculoskeletal negative musculoskeletal ROS (+)   Abdominal   Peds negative pediatric ROS (+)  Hematology negative hematology ROS (+) anemia ,   Anesthesia Other Findings   Reproductive/Obstetrics negative OB ROS                            Anesthesia Physical Anesthesia Plan  ASA: III  Anesthesia Plan: MAC   Post-op Pain Management:    Induction: Intravenous  PONV Risk Score and Plan:   Airway Management Planned: Simple Face Mask  Additional Equipment:   Intra-op Plan:   Post-operative Plan:   Informed Consent: I have reviewed the patients History and Physical, chart, labs and discussed the procedure including the risks, benefits and alternatives for the proposed anesthesia with the patient or authorized representative who has indicated his/her understanding and acceptance.   Dental advisory given  Plan Discussed with: CRNA and Surgeon  Anesthesia Plan Comments:         Anesthesia Quick Evaluation

## 2016-10-10 NOTE — Progress Notes (Signed)
Hypoglycemic Event  CBG: 56  Treatment: 15 GM gel  Symptoms: None  Follow-up CBG: Time:2316 CBG Result:105  Possible Reasons for Event: Inadequate meal intake  Comments/MD notified:Dean,MD called back said to give tube of glucose     Jacob Barnes

## 2016-10-10 NOTE — H&P (Signed)
Jacob Barnes is an 76 y.o. male.   Chief Complaint:   Right middle finger tip necrosis/infection HPI:   76 yo male with diabetes who underwent a recent right middle finger amputation thru the middle phalanx due to osteomyelitis.  He has been unable to heal that wound and further surgery is recommended to improve healing.  Past Medical History:  Diagnosis Date  . Abrasion of right middle finger with infection    for 1 week,   . Anemia   . Arthritis    hands  . Chronic kidney disease   . Diabetes mellitus    says since 1979 type 2  . GERD (gastroesophageal reflux disease)   . Glaucoma    both eyes  . History of kidney stones   . Hyperlipemia   . Hypertension   . Rash    front abdomen no drainage    Past Surgical History:  Procedure Laterality Date  . AMPUTATION Right 09/01/2016   Procedure: AMPUTATION RIGHT MIDDLE FINGER TIP;  Surgeon: Mcarthur Rossetti, MD;  Location: [facility];  Service: Orthopedics;  Laterality: Right;  . APPENDECTOMY    . COLONOSCOPY  09/06/2011   Procedure: COLONOSCOPY;  Surgeon: Rogene Houston, MD;  [facility];  Service: Endoscopy;  Laterality: N/A;  830  . CYSTOSCOPY/RETROGRADE/URETEROSCOPY/STONE EXTRACTION WITH BASKET    . ESOPHAGOGASTRODUODENOSCOPY N/A 01/15/2014   Procedure: ESOPHAGOGASTRODUODENOSCOPY (EGD);  Surgeon: Rogene Houston, MD;  [facility];  Service: Endoscopy;  Laterality: N/A;  200  . ESOPHAGOGASTRODUODENOSCOPY N/A 04/22/2014   Procedure: ESOPHAGOGASTRODUODENOSCOPY (EGD);  Surgeon: Rogene Houston, MD;  [facility];  Service: Endoscopy;  Laterality: N/A;  240  . EYE SURGERY Bilateral yrs ago   ioc for cataract  . MALONEY DILATION N/A 01/15/2014   Procedure: MALONEY DILATION;  Surgeon: Rogene Houston, MD;  [facility];  Service: Endoscopy;  Laterality: N/A;  . MALONEY DILATION  04/22/2014   Procedure: Venia Minks DILATION;  Surgeon: Rogene Houston, MD;  [facility];  Service:  Endoscopy;;  . surgery for left eye bleedf  2017    Family History  Problem Relation Age of Onset  . Diabetes Mother   . Diabetes Father   . Heart disease Father   . Diabetes Brother   . Diabetes Brother   . Diabetes Son    Social History:  reports that he has never smoked. He has never used smokeless tobacco. He reports that he does not drink alcohol or use drugs.  Allergies:  Allergies  Allergen Reactions  . No Known Allergies     Medications Prior to Admission  Medication Sig Dispense Refill  . Cholecalciferol (VITAMIN D PO) Take 400 Units by mouth daily.     . ferrous sulfate 325 (65 FE) MG tablet Take 325 mg by mouth daily with breakfast.     . HYDROcodone-acetaminophen (NORCO) 5-325 MG tablet Take 1-2 tablets by mouth every 4 (four) hours as needed for moderate pain. 50 tablet 0  . Insulin Glargine (LANTUS SOLOSTAR) 100 UNIT/ML Solostar Pen Inject 20 Units into the skin daily at 10 pm. 15 pen 1  . pantoprazole (PROTONIX) 40 MG tablet TAKE 1 TABLET EVERY DAY 90 tablet 1  . sulfamethoxazole-trimethoprim (BACTRIM DS,SEPTRA DS) 800-160 MG tablet Take 1 tablet by mouth 2 (two) times daily. 30 tablet 0  . timolol (TIMOPTIC) 0.5 % ophthalmic solution Place 1 drop into both eyes daily.     Marland Kitchen ACCU-CHEK FASTCLIX LANCETS  MISC TEST BLOOD SUGAR THREE TIMES DAILY 306 each 3  . ACCU-CHEK SMARTVIEW test strip USE AS INSTRUCTED THREE TIMES DAILY 300 each 1  . blood glucose meter kit and supplies Dispense based on patient and insurance preference. Use up to three times daily as directed. (dx E11.65). 1 each 0  . doxycycline (VIBRA-TABS) 100 MG tablet Take 1 tablet (100 mg total) by mouth 2 (two) times daily. 30 tablet 0    Results for orders placed or performed during the hospital encounter of 10/10/16 (from the past 48 hour(s))  Glucose, capillary     Status: None   Collection Time: 10/10/16  3:14 PM  Result Value Ref Range   Glucose-Capillary 83 65 - 99 mg/dL  Basic metabolic panel      Status: Abnormal   Collection Time: 10/10/16  3:32 PM  Result Value Ref Range   Sodium 133 (L) 135 - 145 mmol/L   Potassium 4.6 3.5 - 5.1 mmol/L   Chloride 103 101 - 111 mmol/L   CO2 23 22 - 32 mmol/L   Glucose, Bld 86 65 - 99 mg/dL   BUN 15 6 - 20 mg/dL   Creatinine, Ser 1.51 (H) 0.61 - 1.24 mg/dL   Calcium 8.6 (L) 8.9 - 10.3 mg/dL   GFR calc non Af Amer 43 (L) >60 mL/min   GFR calc Af Amer 50 (L) >60 mL/min    Comment: (NOTE) The eGFR has been calculated using the CKD EPI equation. This calculation has not been validated in all clinical situations. eGFR's persistently <60 mL/min signify possible Chronic Kidney Disease.    Anion gap 7 5 - 15  CBC     Status: Abnormal   Collection Time: 10/10/16  3:32 PM  Result Value Ref Range   WBC 4.3 4.0 - 10.5 K/uL   RBC 3.08 (L) 4.22 - 5.81 MIL/uL   Hemoglobin 9.1 (L) 13.0 - 17.0 g/dL   HCT 28.2 (L) 39.0 - 52.0 %   MCV 91.6 78.0 - 100.0 fL   MCH 29.5 26.0 - 34.0 pg   MCHC 32.3 30.0 - 36.0 g/dL   RDW 14.2 11.5 - 15.5 %   Platelets 229 150 - 400 K/uL   No results found.  Review of Systems  All other systems reviewed and are negative.   Blood pressure (!) 117/59, pulse 67, temperature 98.7 F (37.1 C), temperature source Oral, resp. rate 16, height _0  (1.651 m), weight 120 lb (54.4 kg), SpO2 100 %. Physical Exam  Constitutional: He is oriented to person, place, and time. He appears well-developed and well-nourished.  HENT:  Head: Normocephalic.  Eyes: Pupils are equal, round, and reactive to light.  Neck: Normal range of motion.  Cardiovascular: Normal rate.   Respiratory: Effort normal.  GI: Soft.  Musculoskeletal:       Hands: Neurological: He is alert and oriented to person, place, and time.  Skin: Skin is warm and dry.  Psychiatric: He has a normal mood and affect.     Assessment/Plan Right middle finger infection with continued breakdown and necrosis post an amputation of the tip recently  1)  To the OR today for  a revision amputation and irrigation/debridement of his right middle finger.  Risks and benefits discussed and informed consent obtained.  Mcarthur Rossetti, MD 10/10/2016, 5:18 PM

## 2016-10-10 NOTE — Anesthesia Postprocedure Evaluation (Signed)
Anesthesia Post Note  Patient: Jacob Barnes  Procedure(s) Performed: Procedure(s) (LRB): REPEAT IRRIGATION AND DEBRIDEMENT RIGHT MIDDLE FINGER WITH AMPUTATION THROUGH PROXIMAL PHALANX (Right)     Patient location during evaluation: PACU Anesthesia Type: MAC Level of consciousness: awake and alert Pain management: pain level controlled Vital Signs Assessment: post-procedure vital signs reviewed and stable Respiratory status: spontaneous breathing, nonlabored ventilation, respiratory function stable and patient connected to nasal cannula oxygen Cardiovascular status: stable and blood pressure returned to baseline Anesthetic complications: no    Last Vitals:  Vitals:   10/10/16 1954 10/10/16 2000  BP:    Pulse: (!) 58 (!) 59  Resp: 16 12  Temp: 36.6 C     Last Pain:  Vitals:   10/10/16 1533  TempSrc: Oral                 Kaj Vasil S

## 2016-10-10 NOTE — Transfer of Care (Signed)
Immediate Anesthesia Transfer of Care Note  Patient: Jacob Barnes  Procedure(s) Performed: Procedure(s): REPEAT IRRIGATION AND DEBRIDEMENT RIGHT MIDDLE FINGER WITH AMPUTATION THROUGH PROXIMAL PHALANX (Right)  Patient Location: PACU  Anesthesia Type:MAC  Level of Consciousness: awake, alert , oriented and patient cooperative  Airway & Oxygen Therapy: Patient Spontanous Breathing and Patient connected to nasal cannula oxygen  Post-op Assessment: Report given to RN, Post -op Vital signs reviewed and stable and Patient moving all extremities  Post vital signs: Reviewed and stable  Last Vitals:  Vitals:   10/10/16 1533  BP: (!) 117/59  Pulse: 67  Resp: 16  Temp: 37.1 C    Last Pain:  Vitals:   10/10/16 1533  TempSrc: Oral      Patients Stated Pain Goal: 0 (42/35/36 1443)  Complications: No apparent anesthesia complications

## 2016-10-11 ENCOUNTER — Encounter (HOSPITAL_COMMUNITY): Payer: Self-pay | Admitting: Orthopaedic Surgery

## 2016-10-11 DIAGNOSIS — E1169 Type 2 diabetes mellitus with other specified complication: Secondary | ICD-10-CM | POA: Diagnosis not present

## 2016-10-11 LAB — GLUCOSE, CAPILLARY
GLUCOSE-CAPILLARY: 304 mg/dL — AB (ref 65–99)
Glucose-Capillary: 112 mg/dL — ABNORMAL HIGH (ref 65–99)

## 2016-10-11 MED ORDER — INSULIN ASPART 100 UNIT/ML ~~LOC~~ SOLN
7.0000 [IU] | Freq: Once | SUBCUTANEOUS | Status: AC
  Administered 2016-10-11: 7 [IU] via SUBCUTANEOUS

## 2016-10-11 MED ORDER — HYDROCODONE-ACETAMINOPHEN 5-325 MG PO TABS: 1.0000 | ORAL_TABLET | Freq: Four times a day (QID) | ORAL | 0 refills | Status: DC | PRN

## 2016-10-11 NOTE — Progress Notes (Signed)
Patient ID: Jacob Barnes, male   DOB: Jan 28, 1941, 76 y.o.   MRN: [id number] Tolerated surgery on right hand well.  Blood glucose under poor control and I spoke with him and his family about this.  Can discharge to home today.

## 2016-10-11 NOTE — Progress Notes (Signed)
Jacob Barnes to be D/C'd Home per MD order.  Discussed with the patient and all questions fully answered.  VSS, Skin clean, dry and intact without evidence of skin break down, no evidence of skin tears noted. IV catheter discontinued intact. Site without signs and symptoms of complications. Dressing and pressure applied.  An After Visit Summary was printed and given to the patient. Patient received prescription.  D/c education completed with patient/family including follow up instructions, medication list, d/c activities limitations if indicated, with other d/c instructions as indicated by MD - patient able to verbalize understanding, all questions fully answered.   Patient instructed to return to ED, call 911, or call MD for any changes in condition.   Patient escorted via [place], and D/C home via private auto.  Karolee Ohs 10/11/2016 2:41 PM

## 2016-10-11 NOTE — Discharge Summary (Signed)
Patient ID: Jacob Barnes MRN: [id number] DOB/AGE: 1941/03/23 76 y.o.  Admit date: 10/10/2016 Discharge date: 10/11/2016  Admission Diagnoses:  Principal Problem:   Osteomyelitis of finger of right hand [facility]) Active Problems:   Amputation of right middle finger   Discharge Diagnoses:  Same  Past Medical History:  Diagnosis Date  . Abrasion of right middle finger with infection    for 1 week,   . Anemia   . Arthritis    Barnes  . Chronic kidney disease   . Diabetes mellitus    says since 1979 type 2  . GERD (gastroesophageal reflux disease)   . Glaucoma    both eyes  . History of kidney stones   . Hyperlipemia   . Hypertension   . Rash    front abdomen no drainage    Surgeries: Procedure(s): REPEAT IRRIGATION AND DEBRIDEMENT RIGHT MIDDLE FINGER WITH AMPUTATION THROUGH PROXIMAL PHALANX on 10/10/2016   Consultants:   Discharged Condition: Improved  Hospital Course: Jacob Barnes is an 76 y.o. male who was admitted 10/10/2016 for operative treatment ofOsteomyelitis of finger of right hand ([place]). Patient has severe unremitting pain that affects sleep, daily activities, and work/hobbies. After pre-op clearance the patient was taken to the operating room on 10/10/2016 and underwent  Procedure(s): REPEAT IRRIGATION AND DEBRIDEMENT RIGHT MIDDLE FINGER WITH AMPUTATION THROUGH PROXIMAL PHALANX.    Patient was given perioperative antibiotics: Anti-infectives    Start     Dose/Rate Route Frequency Ordered Stop   10/11/16 0000  ceFAZolin (ANCEF) IVPB 1 g/50 mL premix     1 g 100 mL/hr over 30 Minutes Intravenous Every 6 hours 10/10/16 2015 10/11/16 1759   10/10/16 2300  sulfamethoxazole-trimethoprim (BACTRIM DS,SEPTRA DS) 800-160 MG per tablet 1 tablet     1 tablet Oral 2 times daily 10/10/16 2015     10/10/16 1600  ceFAZolin (ANCEF) IVPB 2g/100 mL premix     2 g 200 mL/hr over 30 Minutes Intravenous To [facility] 10/10/16 0819 10/10/16 1816       Patient was given sequential  compression devices, early ambulation, and chemoprophylaxis to prevent DVT.  Patient benefited maximally from hospital stay and there were no complications.    Recent vital signs: Patient Vitals for the past 24 hrs:  BP Temp Temp src Pulse Resp SpO2 Height Weight  10/11/16 0549 114/71 98.9 F (37.2 C) Oral 81 18 95 % - -  10/11/16 0039 (!) 117/56 98 F (36.7 C) Oral 80 18 100 % - -  10/10/16 2101 139/67 98 F (36.7 C) Oral 66 18 100 % - -  10/10/16 2000 - - - (!) 59 12 100 % - -  10/10/16 1954 - 97.9 F (36.6 C) - (!) 58 16 100 % - -  10/10/16 1945 (!) 144/73 - - (!) 58 12 100 % - -  10/10/16 1930 138/77 - - (!) 58 12 100 % - -  10/10/16 1915 138/74 - - 61 12 100 % - -  10/10/16 1900 129/69 (!) 97.3 F (36.3 C) - 60 12 100 % - -  10/10/16 1533 (!) 117/59 98.7 F (37.1 C) Oral 67 16 100 % - -  10/10/16 1524 - - - - - - '5\' 5"'  (1.651 m) 120 lb (54.4 kg)     Recent laboratory studies:  Recent Labs  10/10/16 1532  WBC 4.3  HGB 9.1*  HCT 28.2*  PLT 229  NA 133*  K 4.6  CL 103  CO2 23  BUN 15  CREATININE 1.51*  GLUCOSE 86  CALCIUM 8.6*     Discharge Medications:   Allergies as of 10/11/2016      Reactions   No Known Allergies       Medication List    STOP taking these medications   doxycycline 100 MG tablet Commonly known as:  VIBRA-TABS     TAKE these medications   ACCU-CHEK FASTCLIX LANCETS Misc TEST BLOOD SUGAR THREE TIMES DAILY   ACCU-CHEK SMARTVIEW test strip Generic drug:  glucose blood USE AS INSTRUCTED THREE TIMES DAILY   blood glucose meter kit and supplies Dispense based on patient and insurance preference. Use up to three times daily as directed. (dx E11.65).   ferrous sulfate 325 (65 FE) MG tablet Take 325 mg by mouth daily with breakfast.   HYDROcodone-acetaminophen 5-325 MG tablet Commonly known as:  NORCO Take 1 tablet by mouth every 6 (six) hours as needed for moderate pain. What changed:  how much to take  when to take this    Insulin Glargine 100 UNIT/ML Solostar Pen Commonly known as:  LANTUS SOLOSTAR Inject 20 Units into the skin daily at 10 pm.   pantoprazole 40 MG tablet Commonly known as:  PROTONIX TAKE 1 TABLET EVERY DAY   sulfamethoxazole-trimethoprim 800-160 MG tablet Commonly known as:  BACTRIM DS,SEPTRA DS Take 1 tablet by mouth 2 (two) times daily.   timolol 0.5 % ophthalmic solution Commonly known as:  TIMOPTIC Place 1 drop into both eyes daily.   VITAMIN D PO Take 400 Units by mouth daily.       Diagnostic Studies: No results found.  Disposition: 01-Home or Self Care  Discharge Instructions    Discharge patient    Complete by:  As directed    Discharge disposition:  01-Home or Self Care   Discharge patient date:  10/11/2016      Follow-up Information    Mcarthur Rossetti, MD. Schedule an appointment as soon as possible for a visit in 1 week(s).   Specialty:  Orthopedic Surgery Contact information: [place] [address] [phone]            Signed: Mcarthur Rossetti 10/11/2016, 7:35 AM

## 2016-10-11 NOTE — Discharge Instructions (Signed)
Leave your current dressing alone and keep it clean and dry.  Really work on good blood glucose control.

## 2016-10-12 ENCOUNTER — Ambulatory Visit (INDEPENDENT_AMBULATORY_CARE_PROVIDER_SITE_OTHER): Payer: Medicare HMO | Admitting: Orthopaedic Surgery

## 2016-10-12 NOTE — Op Note (Signed)
NAME:  Jacob Barnes, Jacob Barnes                        ACCOUNT NO.:  MEDICAL RECORD NO.:  [id number]  LOCATION:                                 FACILITY:  PHYSICIAN:  Lind Guest. Ninfa Linden, M.D.DATE OF BIRTH:  DATE OF PROCEDURE:  10/10/2016 DATE OF DISCHARGE:                              OPERATIVE REPORT   PREOPERATIVE DIAGNOSIS:  Right middle finger infection with necrosis, status post an amputation of the fingertip.  POSTOPERATIVE DIAGNOSIS:  Right middle finger infection with necrosis, status post an amputation of the fingertip.  PROCEDURE:  Right middle finger amputation through the proximal phalanx.  SURGEON:  Lind Guest. Ninfa Linden, M.D.  ANESTHESIA: 1. Mask ventilation and IV sedation. 2. Local with 0.25% plain Marcaine.  BLOOD LOSS:  Minimal.  COMPLICATIONS:  None.  INDICATIONS:  [name] is a 76 year old diabetic, who developed osteomyelitis of his right fingertip.  He was taken to the operating room about a month or so ago and underwent amputation of fingertip through the middle phalanx.  Since the time of surgery, his blood glucose has now been under good control and was not before.  He has not been able to heal the fingertip and has become more necrotic and malodorous.  At this point, I recommended a ray amputation through the metacarpal, but the family is reluctant to do so and they will agree totally in amputation through the proximal phalanx.  I have explained them in detail that the potential of wound healing is still concerning and the blood glucose control is important as well as stopping smoking.  PROCEDURE DESCRIPTION:  After informed consent was obtained, appropriate right hand was marked.  He was brought to the operating room and placed supine on the operating table.  Mask ventilation, IV sedation was obtained.  His right hand was prepped and draped with Betadine scrub and paint.  A time-out was called, he was identified as correct patient and correct right  hand.  I then performed an elliptical type of incision some sort through the proximal phalanx and then dissected down to the bone.  The bone was quite necrotic and had evidence of osteomyelitis.  I was able to rongeur back the bone up to the proximal phalanx at the mid proximal phalanx and got good healthy-appearing bone.  I then excised necrotic soft tissue as well as sharp with #10 blade.  We then irrigated the soft tissue with normal saline solution and I reapproximated the skin loosely with interrupted 3-0 nylon suture.  Xeroform and well-padded sterile dressing were applied.  He was taken to the recovery room in stable condition. All final counts were correct.  There were no complications noted.     Lind Guest. Ninfa Linden, M.D.     CYB/MEDQ  D:  10/10/2016  T:  10/10/2016  Job:  [id number]

## 2016-10-16 ENCOUNTER — Telehealth: Payer: Self-pay

## 2016-10-16 ENCOUNTER — Other Ambulatory Visit: Payer: Self-pay

## 2016-10-16 ENCOUNTER — Ambulatory Visit (INDEPENDENT_AMBULATORY_CARE_PROVIDER_SITE_OTHER): Payer: Medicare HMO | Admitting: Physician Assistant

## 2016-10-16 ENCOUNTER — Encounter (INDEPENDENT_AMBULATORY_CARE_PROVIDER_SITE_OTHER): Payer: Self-pay | Admitting: Physician Assistant

## 2016-10-16 DIAGNOSIS — M869 Osteomyelitis, unspecified: Secondary | ICD-10-CM

## 2016-10-16 MED ORDER — INSULIN REGULAR HUMAN 100 UNIT/ML IJ SOLN: INTRAMUSCULAR | 3 refills | Status: DC

## 2016-10-16 MED ORDER — SULFAMETHOXAZOLE-TRIMETHOPRIM 800-160 MG PO TABS: 1.0000 | ORAL_TABLET | Freq: Two times a day (BID) | ORAL | 0 refills | Status: DC

## 2016-10-16 NOTE — Progress Notes (Signed)
Office Visit Note   Patient: LEMARCUS Barnes           Date of Birth: [birth year]           MRN: [id number] Visit Date: 10/16/2016              Requested by: Jacob Barnes, [address], [place], [place] [address] PCP: Jacob Helper, MD   Assessment & Plan: Visit Diagnoses:  1. Osteomyelitis of finger of right hand ([place])     Plan: He is to see his PCP for his uncontrolled Diabetes . Follow up in 1 week for possible removal of sutures. Continue Bactrim DS.  Daily dressing changes after washing the foot and completley drying the foot.  Follow-Up Instructions: Return in about 1 week (around 10/23/2016).   Orders:  No orders of the defined types were placed in this encounter.  Meds ordered this encounter  Medications  . sulfamethoxazole-trimethoprim (BACTRIM DS,SEPTRA DS) 800-160 MG tablet    Sig: Take 1 tablet by mouth 2 (two) times daily.    Dispense:  28 tablet    Refill:  0      Procedures: No procedures performed   Clinical Data: No additional findings.   Subjective: Chief Complaint  Patient presents with  . Right Middle Finger - Routine Post Op    HPI Jacob Barnes returns 1 week status post I&D right middle finger with amputation through proximal  Phalanx. He is currently on no antibiotics.  He still has glucose levels in the 300's in the afternoon.  Review of Systems   Objective: Vital Signs: There were no vitals taken for this visit.  Physical Exam  Constitutional: He is oriented to person, place, and time. He appears well-developed and well-nourished. No distress.  Neurological: He is alert and oriented to person, place, and time.    Ortho Exam Right middle finger amputation site healing well with sutures well approximating incision. No gross infection.  Specialty Comments:  No specialty comments available.  Imaging: No results found.   PMFS History: Patient Active Problem List   Diagnosis Date Noted  . Amputation of right  middle finger 10/10/2016  . Osteomyelitis of finger of right hand ([place]) 08/30/2016  . Osteomyelitis of finger ([place]) 08/28/2016  . Dysphagia, pharyngoesophageal phase 01/14/2014  . Iron deficiency anemia 01/10/2014  . Other pancytopenia ([place]) 01/10/2014  . Dysphagia 01/08/2014  . CKD (chronic kidney disease) stage 3, GFR 30-59 ml/min 12/15/2013  . Protein-calorie malnutrition ([place]) 12/11/2013  . Hyperlipidemia 07/16/2013  . GERD (gastroesophageal reflux disease) 06/27/2012  . CARPAL TUNNEL SYNDROME 08/05/2009  . ERECTILE DYSFUNCTION, ORGANIC 01/20/2009  . Essential hypertension 01/02/2009  . Diabetes mellitus, insulin dependent (IDDM), uncontrolled ([place]) 12/21/2008   Past Medical History:  Diagnosis Date  . Abrasion of right middle finger with infection    for 1 week,   . Anemia   . Arthritis    hands  . Chronic kidney disease   . Diabetes mellitus    says since 1979 type 2  . GERD (gastroesophageal reflux disease)   . Glaucoma    both eyes  . History of kidney stones   . Hyperlipemia   . Hypertension   . Rash    front abdomen no drainage    Family History  Problem Relation Age of Onset  . Diabetes Mother   . Diabetes Father   . Heart disease Father   . Diabetes Brother   . Diabetes Brother   .  Diabetes Son     Past Surgical History:  Procedure Laterality Date  . AMPUTATION Right 09/01/2016   Procedure: AMPUTATION RIGHT MIDDLE FINGER TIP;  Surgeon: Mcarthur Rossetti, MD;  Location: [facility];  Service: Orthopedics;  Laterality: Right;  . AMPUTATION Right 10/10/2016   Procedure: REPEAT IRRIGATION AND DEBRIDEMENT RIGHT MIDDLE FINGER WITH AMPUTATION THROUGH PROXIMAL PHALANX;  Surgeon: Mcarthur Rossetti, MD;  Location: [facility];  Service: Orthopedics;  Laterality: Right;  . APPENDECTOMY    . COLONOSCOPY  09/06/2011   Procedure: COLONOSCOPY;  Surgeon: Rogene Houston, MD;  Location: [facility];  Service: Endoscopy;  Laterality: N/A;  830  .  CYSTOSCOPY/RETROGRADE/URETEROSCOPY/STONE EXTRACTION WITH BASKET    . ESOPHAGOGASTRODUODENOSCOPY N/A 01/15/2014   Procedure: ESOPHAGOGASTRODUODENOSCOPY (EGD);  Surgeon: Rogene Houston, MD;  Location: [facility];  Service: Endoscopy;  Laterality: N/A;  200  . ESOPHAGOGASTRODUODENOSCOPY N/A 04/22/2014   Procedure: ESOPHAGOGASTRODUODENOSCOPY (EGD);  Surgeon: Rogene Houston, MD;  Location: [facility];  Service: Endoscopy;  Laterality: N/A;  240  . EYE SURGERY Bilateral yrs ago   ioc for cataract  . MALONEY DILATION N/A 01/15/2014   Procedure: MALONEY DILATION;  Surgeon: Rogene Houston, MD;  Location: [facility];  Service: Endoscopy;  Laterality: N/A;  . MALONEY DILATION  04/22/2014   Procedure: Venia Minks DILATION;  Surgeon: Rogene Houston, MD;  Location[facility];  Service: Endoscopy;;  . surgery for left eye bleedf  2017   Social History   Occupational History  . Not on file.   Social History Main Topics  . Smoking status: Never Smoker  . Smokeless tobacco: Never Used  . Alcohol use No  . Drug use: No  . Sexual activity: Yes

## 2016-10-16 NOTE — Progress Notes (Signed)
Sister aware.  

## 2016-10-16 NOTE — Telephone Encounter (Signed)
Sister aware.  

## 2016-10-16 NOTE — Telephone Encounter (Signed)
Jacob Barnes is here in the office and she was wanting to see if you could write an rx for sliding sclae mealtime insulin because since the osteomyelitis and amputation his sugar around lunchtime (before eating) will be in the 300-350 range. He has an appt next month but she was wanting this for him to take during this healing process while his sugar is shooting up so high. Please advise.

## 2016-10-16 NOTE — Telephone Encounter (Signed)
Regular insulin coverage before lunch:  blood sugar 200 to 250 give 3 units 251 to 300 give 5 units 301 to 350 give 7 units 351 to 400 give 9 units  Please let her know

## 2016-10-17 ENCOUNTER — Other Ambulatory Visit: Payer: Self-pay

## 2016-10-17 MED ORDER — INSULIN REGULAR HUMAN 100 UNIT/ML IJ SOLN: 5.0000 [IU] | Freq: Three times a day (TID) | INTRAMUSCULAR | 3 refills | Status: DC

## 2016-10-23 ENCOUNTER — Encounter (INDEPENDENT_AMBULATORY_CARE_PROVIDER_SITE_OTHER): Payer: Self-pay | Admitting: Physician Assistant

## 2016-10-23 ENCOUNTER — Ambulatory Visit (INDEPENDENT_AMBULATORY_CARE_PROVIDER_SITE_OTHER): Payer: Medicare HMO | Admitting: Physician Assistant

## 2016-10-23 DIAGNOSIS — M869 Osteomyelitis, unspecified: Secondary | ICD-10-CM

## 2016-10-23 NOTE — Progress Notes (Signed)
Jacob Barnes returns today for follow-up of his right middle finger status post irrigation debridement with a dictation through proximal phalanx 10/10/16. He is overall doing well. Had no fevers chills and no drainage from the incision site.  Physical exam right middle finger: Interrupted nylon sutures well approximated incision. There is no drainage no signs of infection. No dehiscence of the wound.  Impression:13 days status post repeat I&D right middle finger with a dictation through proximal phalanx.  Plan: Sutures removed. He'll wash the hand without soaking it with an antibacterial soap. Apply Vaseline no dressing needed. Follow-up with [place] in a week check the wound.

## 2016-10-30 ENCOUNTER — Ambulatory Visit (INDEPENDENT_AMBULATORY_CARE_PROVIDER_SITE_OTHER): Payer: Medicare HMO | Admitting: Orthopaedic Surgery

## 2016-10-30 DIAGNOSIS — S68112A Complete traumatic metacarpophalangeal amputation of right middle finger, initial encounter: Secondary | ICD-10-CM

## 2016-10-30 NOTE — Progress Notes (Signed)
The patient is continue to follow-up status post a revision" of his right hand middle finger secondary to osteomyelitis. He had wound healing problems due to poorly controlled diabetes. His family is with him and said they're working on diabetes control. He is doing well overall. He is here today for wound follow-up.  Examination of his right hand middle finger shows is only a small eschar area that looks great overall. There is no drainage at this point. There is no odor and evidence infection. There is good pink healing tissue.  He'll continue just local wound care. We'll see him back now this point in 3 weeks for over a final visit then.

## 2016-11-02 ENCOUNTER — Telehealth: Payer: Self-pay

## 2016-11-02 NOTE — Telephone Encounter (Signed)
Called pt to schedule Medicare Annual Wellness Visit. -nr  

## 2016-11-13 ENCOUNTER — Other Ambulatory Visit: Payer: Self-pay | Admitting: Family Medicine

## 2016-11-13 MED ORDER — GLUCOSE BLOOD VI STRP: ORAL_STRIP | 12 refills | Status: DC

## 2016-11-14 DIAGNOSIS — R69 Illness, unspecified: Secondary | ICD-10-CM | POA: Diagnosis not present

## 2016-11-16 ENCOUNTER — Other Ambulatory Visit: Payer: Self-pay

## 2016-11-16 ENCOUNTER — Telehealth: Payer: Self-pay | Admitting: Family Medicine

## 2016-11-16 DIAGNOSIS — R69 Illness, unspecified: Secondary | ICD-10-CM | POA: Diagnosis not present

## 2016-11-16 MED ORDER — GLUCOSE BLOOD VI STRP: ORAL_STRIP | 12 refills | Status: DC

## 2016-11-16 NOTE — Telephone Encounter (Signed)
°  Patient is in need of his strips for his 1-touch (completely out).  Ran out yesterday!    [facility]

## 2016-11-16 NOTE — Telephone Encounter (Signed)
Strips sent

## 2016-11-21 ENCOUNTER — Ambulatory Visit (INDEPENDENT_AMBULATORY_CARE_PROVIDER_SITE_OTHER): Payer: Self-pay | Admitting: Physician Assistant

## 2016-11-21 DIAGNOSIS — M869 Osteomyelitis, unspecified: Secondary | ICD-10-CM

## 2016-11-21 MED ORDER — GABAPENTIN 300 MG PO CAPS: 300.0000 mg | ORAL_CAPSULE | Freq: Every day | ORAL | 0 refills | Status: DC

## 2016-11-21 NOTE — Progress Notes (Signed)
Jacob Barnes returns today follow-up of repeat irrigation and debridement right middle finger amputation through proximal phalanx. He is overall doing well as far as the wound incision. He states his glucose levels are under better control. He however is developed some burning and stinging in the finger lately. Pain is been ongoing for the past couple days.  Right middle finger: Surgical site is healing well no signs of infection. He is slight edema no erythema. There is no drainage.  Impression : Status post right middle finger amputation.  Plan: We'll start him on some Neurontin at night. Encouraged him to continue to keep his glucose levels under control. Follow-up in a month.

## 2016-11-27 DIAGNOSIS — I10 Essential (primary) hypertension: Secondary | ICD-10-CM | POA: Diagnosis not present

## 2016-11-27 DIAGNOSIS — E1069 Type 1 diabetes mellitus with other specified complication: Secondary | ICD-10-CM | POA: Diagnosis not present

## 2016-11-27 DIAGNOSIS — E785 Hyperlipidemia, unspecified: Secondary | ICD-10-CM | POA: Diagnosis not present

## 2016-11-27 DIAGNOSIS — E1065 Type 1 diabetes mellitus with hyperglycemia: Secondary | ICD-10-CM | POA: Diagnosis not present

## 2016-11-28 LAB — BASIC METABOLIC PANEL WITH GFR
BUN: 14 mg/dL (ref 7–25)
CHLORIDE: 103 mmol/L (ref 98–110)
CO2: 32 mmol/L (ref 20–32)
Calcium: 8.5 mg/dL — ABNORMAL LOW (ref 8.6–10.3)
Creat: 1.15 mg/dL (ref 0.70–1.18)
GFR, Est African American: 71 mL/min (ref >=60)
GFR, Est Non African American: 61 mL/min (ref >=60)
GLUCOSE: 102 mg/dL — AB (ref 65–99)
POTASSIUM: 4.6 mmol/L (ref 3.5–5.3)
Sodium: 139 mmol/L (ref 135–146)

## 2016-11-28 LAB — LIPID PANEL
CHOLESTEROL: 100 mg/dL (ref <200)
HDL: 48 mg/dL (ref >40)
LDL Cholesterol: 45 mg/dL (ref <100)
Total CHOL/HDL Ratio: 2.1 Ratio (ref <5.0)
Triglycerides: 36 mg/dL (ref <150)
VLDL: 7 mg/dL (ref <30)

## 2016-11-28 LAB — HEMOGLOBIN A1C
HEMOGLOBIN A1C: 8.1 % — AB (ref <5.7)
MEAN PLASMA GLUCOSE: 186 mg/dL

## 2016-12-01 DIAGNOSIS — R69 Illness, unspecified: Secondary | ICD-10-CM | POA: Diagnosis not present

## 2016-12-05 ENCOUNTER — Ambulatory Visit (INDEPENDENT_AMBULATORY_CARE_PROVIDER_SITE_OTHER): Payer: Medicare HMO | Admitting: Family Medicine

## 2016-12-05 ENCOUNTER — Encounter: Payer: Self-pay | Admitting: Family Medicine

## 2016-12-05 VITALS — BP 146/84 | HR 72 | Resp 16 | Ht 65.0 in | Wt 118.0 lb

## 2016-12-05 DIAGNOSIS — E1069 Type 1 diabetes mellitus with other specified complication: Secondary | ICD-10-CM | POA: Diagnosis not present

## 2016-12-05 DIAGNOSIS — Z23 Encounter for immunization: Secondary | ICD-10-CM | POA: Diagnosis not present

## 2016-12-05 DIAGNOSIS — I10 Essential (primary) hypertension: Secondary | ICD-10-CM | POA: Diagnosis not present

## 2016-12-05 DIAGNOSIS — Z1211 Encounter for screening for malignant neoplasm of colon: Secondary | ICD-10-CM

## 2016-12-05 DIAGNOSIS — IMO0002 Reserved for concepts with insufficient information to code with codable children: Secondary | ICD-10-CM

## 2016-12-05 DIAGNOSIS — E1065 Type 1 diabetes mellitus with hyperglycemia: Secondary | ICD-10-CM

## 2016-12-05 DIAGNOSIS — Z0000 Encounter for general adult medical examination without abnormal findings: Secondary | ICD-10-CM | POA: Diagnosis not present

## 2016-12-05 LAB — POC HEMOCCULT BLD/STL (OFFICE/1-CARD/DIAGNOSTIC): FECAL OCCULT BLD: NEGATIVE

## 2016-12-05 MED ORDER — BENAZEPRIL HCL 10 MG PO TABS: 10.0000 mg | ORAL_TABLET | Freq: Every day | ORAL | 3 refills | Status: DC

## 2016-12-05 NOTE — Patient Instructions (Signed)
F/u in mid January , call if you need me before  Flu vaccine today  Congrats on improved blood sugar  Start daily benazepril one daily for blood pressure and kidney protection Microalb if able  Non fast hBA1c , chem 7 and eGFR 1 week before next visit please  Thank you  for choosing [facility]. We consider it a privelige to serve you.  Delivering excellent health care in a caring and  compassionate way is our goal.  Partnering with you,  so that together we can achieve this goal is our strategy.

## 2016-12-06 ENCOUNTER — Other Ambulatory Visit (HOSPITAL_COMMUNITY)
Admission: RE | Admit: 2016-12-06 | Discharge: 2016-12-06 | Disposition: A | Discharge status: Home / Self Care | Payer: Medicare HMO | Source: Ambulatory Visit | Attending: Family Medicine | Admitting: Family Medicine

## 2016-12-06 DIAGNOSIS — E1065 Type 1 diabetes mellitus with hyperglycemia: Secondary | ICD-10-CM | POA: Diagnosis not present

## 2016-12-06 NOTE — Progress Notes (Signed)
Jacob Barnes     MRN: [id number]      DOB: 29-Apr-1940   HPI: Patient is in for annual physical exam. No other health concerns are expressed or addressed at the visit. Recent labs,are reviewed. Immunization is reviewed , and  updated    PE; Pleasant male, alert and oriented x 3, in no cardio-pulmonary distress. Afebrile. HEENT No facial trauma or asymetry. Sinuses non tender EOMI. External ears normal, tympanic membranes clear. Oropharynx moist, no exudate. Neck: supple, no adenopathy,JVD or thyromegaly.No bruits.  Chest: Clear to ascultation bilaterally.No crackles or wheezes. Non tender to palpation  Breast: No asymetry,no masses. No nipple discharge or inversion. No axillary or supraclavicular adenopathy  Cardiovascular system; Heart sounds normal,  S1 and  S2 ,no S3.  No murmur, or thrill. Apical beat not displaced Peripheral pulses normal.  Abdomen: Soft, non tender, no organomegaly or masses. No bruits. Bowel sounds normal. No guarding, tenderness or rebound.  Rectal:  Normal sphincter tone. No hemorrhoids or  masses. guaiac negative stool. Prostate smooth and firm    Musculoskeletal exam: Full ROM of spine, hips , shoulders and knees. No deformity ,swelling or crepitus noted. No muscle wasting or atrophy.  Amputated right middle finger  Neurologic: Cranial nerves 2 to 12 intact. Power, tone ,sensation and reflexes normal throughout. No disturbance in gait. No tremor.  Skin: Intact, no ulceration, erythema , scaling or rash noted. Pigmentation normal throughout Healed amputated right middle finger  Psych; Normal mood and affect. Judgement and concentration normal   Assessment & Plan:  Annual physical exam Annual exam as documented.  Immunization and cancer screening needs are specifically addressed at this visit.   Essential hypertension Uncontrolled due to non compliance. Need to commit to daily medication is stressed Resume  benazepril as before Medication prescribed  DASH diet and commitment to daily physical activity for a minimum of 30 minutes discussed and encouraged, as a part of hypertension management. The importance of attaining a healthy weight is also discussed.  BP/Weight 12/05/2016 10/11/2016 10/10/2016 09/26/2016 09/01/2016 08/30/2016 0/73/7106  Systolic BP 269 485 - - 462 703 500  Diastolic BP 84 62 - - 83 76 69  Wt. (Lbs) 118 - 120 120 120 120.4 122  BMI 19.64 - 19.97 19.97 19.97 20.04 20.3       Diabetes mellitus, insulin dependent (IDDM), uncontrolled ([facility]) Markedly improved , needs tocontinue/ resume three times daily testing with coverage Jacob Barnes is reminded of the importance of commitment to daily physical activity for 30 minutes or more, as able and the need to limit carbohydrate intake to 30 to 60 grams per meal to help with blood sugar control.   The need to take medication as prescribed, test blood sugar as directed, and to call between visits if there is a concern that blood sugar is uncontrolled is also discussed.   Jacob Barnes is reminded of the importance of daily foot exam, annual eye examination, and good blood sugar, blood pressure and cholesterol control.  Diabetic Labs Latest Ref Rng & Units 11/27/2016 10/10/2016 08/30/2016 06/06/2016 01/04/2016  HbA1c <5.7 % 8.1(H) - 10.1(H) 9.4(H) 8.8(H)  Microalbumin <2.0 mg/dL - - - - -  Micro/Creat Ratio 0.0 - 30.0 mg/g - - - - -  Chol <200 mg/dL 100 - - - -  HDL >40 mg/dL 48 - - - -  Calc LDL <100 mg/dL 45 - - - -  Triglycerides <150 mg/dL 36 - - - -  Creatinine 0.70 - 1.18 mg/dL  1.15 1.51(H) 1.66(H) 1.13 1.36(H)   BP/Weight 12/05/2016 10/11/2016 10/10/2016 09/26/2016 09/01/2016 08/30/2016 2/88/3374  Systolic BP 451 460 - - 479 987 215  Diastolic BP 84 62 - - 83 76 69  Wt. (Lbs) 118 - 120 120 120 120.4 122  BMI 19.64 - 19.97 19.97 19.97 20.04 20.3   Foot/eye exam completion dates Latest Ref Rng & Units 10/09/2016 10/09/2016  Eye Exam No Retinopathy  Retinopathy(A) Retinopathy(A)  Foot Form Completion - - -      Sister at visit and agrees to do this

## 2016-12-06 NOTE — Assessment & Plan Note (Signed)
Uncontrolled due to non compliance. Need to commit to daily medication is stressed Resume benazepril as before Medication prescribed  DASH diet and commitment to daily physical activity for a minimum of 30 minutes discussed and encouraged, as a part of hypertension management. The importance of attaining a healthy weight is also discussed.  BP/Weight 12/05/2016 10/11/2016 10/10/2016 09/26/2016 09/01/2016 08/30/2016 6/72/0947  Systolic BP 096 283 - - 662 947 654  Diastolic BP 84 62 - - 83 76 69  Wt. (Lbs) 118 - 120 120 120 120.4 122  BMI 19.64 - 19.97 19.97 19.97 20.04 20.3

## 2016-12-06 NOTE — Assessment & Plan Note (Signed)
Annual exam as documented. . Immunization and cancer screening needs are specifically addressed at this visit.  

## 2016-12-06 NOTE — Assessment & Plan Note (Signed)
Markedly improved , needs tocontinue/ resume three times daily testing with coverage Jacob Barnes is reminded of the importance of commitment to daily physical activity for 30 minutes or more, as able and the need to limit carbohydrate intake to 30 to 60 grams per meal to help with blood sugar control.   The need to take medication as prescribed, test blood sugar as directed, and to call between visits if there is a concern that blood sugar is uncontrolled is also discussed.   Jacob Barnes is reminded of the importance of daily foot exam, annual eye examination, and good blood sugar, blood pressure and cholesterol control.  Diabetic Labs Latest Ref Rng & Units 11/27/2016 10/10/2016 08/30/2016 06/06/2016 01/04/2016  HbA1c <5.7 % 8.1(H) - 10.1(H) 9.4(H) 8.8(H)  Microalbumin <2.0 mg/dL - - - - -  Micro/Creat Ratio 0.0 - 30.0 mg/g - - - - -  Chol <200 mg/dL 100 - - - -  HDL >40 mg/dL 48 - - - -  Calc LDL <100 mg/dL 45 - - - -  Triglycerides <150 mg/dL 36 - - - -  Creatinine 0.70 - 1.18 mg/dL 1.15 1.51(H) 1.66(H) 1.13 1.36(H)   BP/Weight 12/05/2016 10/11/2016 10/10/2016 09/26/2016 09/01/2016 08/30/2016 5/64/3329  Systolic BP 518 841 - - 660 630 160  Diastolic BP 84 62 - - 83 76 69  Wt. (Lbs) 118 - 120 120 120 120.4 122  BMI 19.64 - 19.97 19.97 19.97 20.04 20.3   Foot/eye exam completion dates Latest Ref Rng & Units 10/09/2016 10/09/2016  Eye Exam No Retinopathy Retinopathy(A) Retinopathy(A)  Foot Form Completion - - -      Sister at visit and agrees to do this

## 2016-12-07 LAB — MICROALBUMIN / CREATININE URINE RATIO
CREATININE, UR: 184 mg/dL
MICROALB UR: 210.6 ug/mL — AB
MICROALB/CREAT RATIO: 114.5 mg/g{creat} — AB (ref 0.0–30.0)

## 2016-12-19 ENCOUNTER — Ambulatory Visit (INDEPENDENT_AMBULATORY_CARE_PROVIDER_SITE_OTHER): Payer: Medicare HMO | Admitting: Physician Assistant

## 2016-12-19 DIAGNOSIS — M869 Osteomyelitis, unspecified: Secondary | ICD-10-CM

## 2016-12-19 NOTE — Progress Notes (Signed)
Mr. Nelles returns today for follow-up of his right middle finger which she underwent a amputation through the proximal phalanx and then underwent a subsequent irrigation and debridement of the right finger amputation. He is now 70 days out from the repeat I&D. His overall doing well. Reports that his hemoglobin A1c is doing well. He has no complaints. He's having no real pain in the finger. Concerned about the swelling.   Right middle finger amputation sites well-healed no signs of infection. Does have some edema of the right finger compared to the remainder the fingers but this is definitely within normal limits considering his 2 surgeries. He has no pain with palpation throughout the finger.  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 Discussed with him that it may take up for here for the swelling to totally diminish. Follow with [place] if there is any questions or concerns.

## 2017-02-23 DIAGNOSIS — R69 Illness, unspecified: Secondary | ICD-10-CM | POA: Diagnosis not present

## 2017-04-26 ENCOUNTER — Encounter: Payer: Self-pay | Admitting: Family Medicine

## 2017-05-07 ENCOUNTER — Ambulatory Visit: Payer: Medicare HMO | Admitting: Family Medicine

## 2017-05-15 ENCOUNTER — Telehealth: Payer: Self-pay

## 2017-05-15 DIAGNOSIS — I10 Essential (primary) hypertension: Secondary | ICD-10-CM | POA: Diagnosis not present

## 2017-05-15 DIAGNOSIS — D509 Iron deficiency anemia, unspecified: Secondary | ICD-10-CM

## 2017-05-15 DIAGNOSIS — E10649 Type 1 diabetes mellitus with hypoglycemia without coma: Secondary | ICD-10-CM | POA: Diagnosis not present

## 2017-05-15 DIAGNOSIS — E785 Hyperlipidemia, unspecified: Secondary | ICD-10-CM

## 2017-05-15 NOTE — Telephone Encounter (Signed)
Labs ordered.

## 2017-05-21 ENCOUNTER — Encounter: Payer: Self-pay | Admitting: Family Medicine

## 2017-05-21 ENCOUNTER — Ambulatory Visit (INDEPENDENT_AMBULATORY_CARE_PROVIDER_SITE_OTHER): Payer: Medicare HMO | Admitting: Family Medicine

## 2017-05-21 VITALS — BP 132/84 | HR 109 | Resp 16 | Ht 65.0 in | Wt 118.0 lb

## 2017-05-21 DIAGNOSIS — J0100 Acute maxillary sinusitis, unspecified: Secondary | ICD-10-CM | POA: Diagnosis not present

## 2017-05-21 DIAGNOSIS — J209 Acute bronchitis, unspecified: Secondary | ICD-10-CM | POA: Diagnosis not present

## 2017-05-21 DIAGNOSIS — E10649 Type 1 diabetes mellitus with hypoglycemia without coma: Secondary | ICD-10-CM

## 2017-05-21 DIAGNOSIS — I10 Essential (primary) hypertension: Secondary | ICD-10-CM | POA: Diagnosis not present

## 2017-05-21 LAB — LIPID PANEL
CHOLESTEROL: 93 mg/dL (ref <200)
HDL: 37 mg/dL — ABNORMAL LOW (ref >40)
LDL Cholesterol (Calc): 43 mg/dL (calc)
Non-HDL Cholesterol (Calc): 56 mg/dL (calc) (ref <130)
Total CHOL/HDL Ratio: 2.5 (calc) (ref <5.0)
Triglycerides: 46 mg/dL (ref <150)

## 2017-05-21 LAB — CBC WITH DIFFERENTIAL/PLATELET

## 2017-05-21 LAB — BASIC METABOLIC PANEL WITH GFR
BUN/Creatinine Ratio: 13 (calc) (ref 6–22)
BUN: 19 mg/dL (ref 7–25)
CALCIUM: 8.8 mg/dL (ref 8.6–10.3)
CO2: 36 mmol/L — AB (ref 20–32)
CREATININE: 1.5 mg/dL — AB (ref 0.70–1.18)
Chloride: 101 mmol/L (ref 98–110)
GFR, EST NON AFRICAN AMERICAN: 44 mL/min/{1.73_m2} — AB (ref >=60)
GFR, Est African American: 51 mL/min/{1.73_m2} — ABNORMAL LOW (ref >=60)
GLUCOSE: 84 mg/dL (ref 65–99)
Potassium: 4.1 mmol/L (ref 3.5–5.3)
Sodium: 139 mmol/L (ref 135–146)

## 2017-05-21 LAB — HEMOGLOBIN A1C
EAG (MMOL/L): 11.4 (calc)
Hgb A1c MFr Bld: 8.8 % of total Hgb — ABNORMAL HIGH (ref <5.7)
MEAN PLASMA GLUCOSE: 206 (calc)

## 2017-05-21 LAB — MICROALBUMIN / CREATININE URINE RATIO
Creatinine, Urine: 222 mg/dL (ref 20–320)
Microalb Creat Ratio: 249 mcg/mg creat — ABNORMAL HIGH (ref <30)
Microalb, Ur: 55.3 mg/dL

## 2017-05-21 MED ORDER — PENICILLIN V POTASSIUM 500 MG PO TABS
500.0000 mg | ORAL_TABLET | Freq: Three times a day (TID) | ORAL | 0 refills | Status: DC
Start: 2017-05-21 — End: 2017-09-19

## 2017-05-21 MED ORDER — CEFTRIAXONE SODIUM 500 MG IJ SOLR
500.0000 mg | Freq: Once | INTRAMUSCULAR | Status: AC
  Administered 2017-05-21: 500 mg via INTRAMUSCULAR

## 2017-05-21 MED ORDER — BENZONATATE 100 MG PO CAPS: 100.0000 mg | ORAL_CAPSULE | Freq: Two times a day (BID) | ORAL | 0 refills | Status: DC

## 2017-05-21 NOTE — Progress Notes (Signed)
Jacob Barnes     MRN: [id number]      DOB: 11/24/1940   HPI Jacob Barnes is here for follow up and re-evaluation of chronic medical conditions, medication management and review of any available recent lab and radiology data.  Preventive health is updated, specifically  Cancer screening and Immunization.   Questions or concerns regarding consultations or procedures which the PT has had in the interim are  addressed. The PT denies any adverse reactions to current medications since the last visit. No improvement with OTC medication.  Not taking insulin consistently Not eating regularly Not testing blood sugar  Did not want to come to appointmenmt 1 week h/o worsening head and chest congestion, associated with fever and chills intermittently. Nasal drainage has thickened , and is yellowish green, and at times bloody. Sputum is thick and yellow. C/o bilateral ear pressure, denies hearing loss and sore throat. Increasing fatigue , poor appetitie and sleep disturbed by cough.   ROS  Denies chest pains, palpitations and leg swelling Denies abdominal pain, nausea, vomiting,diarrhea or constipation.   Denies dysuria, frequency, hesitancy or incontinence. Denies joint pain, swelling and limitation in mobility. Denies headaches, seizures, numbness, or tingling. Denies depression, anxiety or insomnia. Denies skin break down or rash.   PE  BP 132/84   Pulse (!) 109   Resp 16   Ht 5\' 5"  (1.651 m)   Wt 118 lb (53.5 kg)   SpO2 93%   BMI 19.64 kg/m   Patient alert and oriented and in no cardiopulmonary distress.Ill appearing  HEENT: No facial asymmetry, EOMI,   oropharynx pink and moist.  Neck supple no JVD, no mass.maxillary sinuses tender, TM clear bilaterally, anterior cervical adenitis   Chest: Decreased air entry , scattered crackles and a few wheezes CVS: S1, S2 no murmurs, no S3.Regular rate.  ABD: Soft non tender.   Ext: No edema  MS: Adequate ROM spine, shoulders, hips and  knees.  Skin: Intact, no ulcerations or rash noted.  Psych: Good eye contact, normal affect. Memory intact not anxious or depressed appearing.  CNS: CN 2-12 intact, power,  normal throughout.no focal deficits noted.   Assessment & Plan  Acute bronchitis Rocephin 500 mg iM today for acute bronchitis and sinusitis and 1 week of penicillin and decongestant are prescribed also  Diabetes mellitus, insulin dependent (IDDM), uncontrolled ([facility]) Deteriorated, non compliant with treatment plan, both medication and diet ister advised to assist more consistently. Pt to commit to 10 units insulin daily Jacob Barnes is reminded of the importance of commitment to daily physical activity for 30 minutes or more, as able and the need to limit carbohydrate intake to 30 to 60 grams per meal to help with blood sugar control.   The need to take medication as prescribed, test blood sugar as directed, and to call between visits if there is a concern that blood sugar is uncontrolled is also discussed.   Jacob Barnes is reminded of the importance of daily foot exam, annual eye examination, and good blood sugar, blood pressure and cholesterol control.  Diabetic Labs Latest Ref Rng & Units 05/15/2017 12/06/2016 11/27/2016 10/10/2016 08/30/2016  HbA1c <5.7 % of total Hgb 8.8(H) - 8.1(H) - 10.1(H)  Microalbumin mg/dL 55.3 210.6(H) - - -  Micro/Creat Ratio <30 mcg/mg creat 249(H) 114.5(H) - - -  Chol <200 mg/dL 93 - 100 - -  HDL >40 mg/dL 37(L) - 48 - -  Calc LDL <100 mg/dL - - 45 - -  Triglycerides <  150 mg/dL 46 - 36 - -  Creatinine 0.70 - 1.18 mg/dL 1.50(H) - 1.15 1.51(H) 1.66(H)   BP/Weight 05/21/2017 12/05/2016 10/11/2016 10/10/2016 09/26/2016 09/01/2016 8/59/2924  Systolic BP 462 863 817 - - 711 657  Diastolic BP 84 84 62 - - 83 76  Wt. (Lbs) 118 118 - 120 120 120 120.4  BMI 19.64 19.64 - 19.97 19.97 19.97 20.04   Foot/eye exam completion dates Latest Ref Rng & Units 10/09/2016 10/09/2016  Eye Exam No Retinopathy  Retinopathy(A) Retinopathy(A)  Foot Form Completion - - -      Updated lab needed at/ before next visit.   Essential hypertension Controlled, no change in medication DASH diet and commitment to daily physical activity for a minimum of 30 minutes discussed and encouraged, as a part of hypertension management. The importance of attaining a healthy weight is also discussed.  BP/Weight 05/21/2017 12/05/2016 10/11/2016 10/10/2016 09/26/2016 09/01/2016 12/12/8331  Systolic BP 832 919 166 - - 060 045  Diastolic BP 84 84 62 - - 83 76  Wt. (Lbs) 118 118 - 120 120 120 120.4  BMI 19.64 19.64 - 19.97 19.97 19.97 20.04       Acute maxillary sinusitis Rocephin administered  and pen V prescribed

## 2017-05-21 NOTE — Assessment & Plan Note (Signed)
Rocephin 500 mg iM today for acute bronchitis and sinusitis and 1 week of penicillin and decongestant are prescribed also

## 2017-05-21 NOTE — Patient Instructions (Addendum)
F/u in 3.5 mponths, call if you need me before.  You are treated today for acute bronchitis and sinusitis  Rocephin 500 mg IM is given in the office and 1 week of penicillin and decongestants are prescribed and sent to your local pharmacy  Please start using lantus 10 units every day , your blood sugar is too high   hBA1c, CBC, vit D in 3.5 months

## 2017-05-22 ENCOUNTER — Encounter: Payer: Self-pay | Admitting: Family Medicine

## 2017-05-22 DIAGNOSIS — J0100 Acute maxillary sinusitis, unspecified: Secondary | ICD-10-CM | POA: Insufficient documentation

## 2017-05-22 NOTE — Assessment & Plan Note (Signed)
Rocephin administered  and pen V prescribed

## 2017-05-22 NOTE — Assessment & Plan Note (Addendum)
Deteriorated, non compliant with treatment plan, both medication and diet ister advised to assist more consistently. Pt to commit to 10 units insulin daily Jacob Barnes is reminded of the importance of commitment to daily physical activity for 30 minutes or more, as able and the need to limit carbohydrate intake to 30 to 60 grams per meal to help with blood sugar control.   The need to take medication as prescribed, test blood sugar as directed, and to call between visits if there is a concern that blood sugar is uncontrolled is also discussed.   Jacob Barnes is reminded of the importance of daily foot exam, annual eye examination, and good blood sugar, blood pressure and cholesterol control.  Diabetic Labs Latest Ref Rng & Units 05/15/2017 12/06/2016 11/27/2016 10/10/2016 08/30/2016  HbA1c <5.7 % of total Hgb 8.8(H) - 8.1(H) - 10.1(H)  Microalbumin mg/dL 55.3 210.6(H) - - -  Micro/Creat Ratio <30 mcg/mg creat 249(H) 114.5(H) - - -  Chol <200 mg/dL 93 - 100 - -  HDL >40 mg/dL 37(L) - 48 - -  Calc LDL <100 mg/dL - - 45 - -  Triglycerides <150 mg/dL 46 - 36 - -  Creatinine 0.70 - 1.18 mg/dL 1.50(H) - 1.15 1.51(H) 1.66(H)   BP/Weight 05/21/2017 12/05/2016 10/11/2016 10/10/2016 09/26/2016 09/01/2016 3/56/8616  Systolic BP 837 290 211 - - 155 208  Diastolic BP 84 84 62 - - 83 76  Wt. (Lbs) 118 118 - 120 120 120 120.4  BMI 19.64 19.64 - 19.97 19.97 19.97 20.04   Foot/eye exam completion dates Latest Ref Rng & Units 10/09/2016 10/09/2016  Eye Exam No Retinopathy Retinopathy(A) Retinopathy(A)  Foot Form Completion - - -      Updated lab needed at/ before next visit.

## 2017-05-22 NOTE — Assessment & Plan Note (Signed)
Controlled, no change in medication DASH diet and commitment to daily physical activity for a minimum of 30 minutes discussed and encouraged, as a part of hypertension management. The importance of attaining a healthy weight is also discussed.  BP/Weight 05/21/2017 12/05/2016 10/11/2016 10/10/2016 09/26/2016 09/01/2016 [birth year]1939  Systolic BP 740 814 481 - - 856 314  Diastolic BP 84 84 62 - - 83 76  Wt. (Lbs) 118 118 - 120 120 120 120.4  BMI 19.64 19.64 - 19.97 19.97 19.97 20.04

## 2017-09-12 DIAGNOSIS — E10649 Type 1 diabetes mellitus with hypoglycemia without coma: Secondary | ICD-10-CM | POA: Diagnosis not present

## 2017-09-13 LAB — HEMOGLOBIN A1C
Hgb A1c MFr Bld: 8.5 % of total Hgb — ABNORMAL HIGH (ref <5.7)
Mean Plasma Glucose: 197 (calc)
eAG (mmol/L): 10.9 (calc)

## 2017-09-13 LAB — CBC
HCT: 34.4 % — ABNORMAL LOW (ref 38.5–50.0)
HEMOGLOBIN: 11.9 g/dL — AB (ref 13.2–17.1)
MCH: 31.2 pg (ref 27.0–33.0)
MCHC: 34.6 g/dL (ref 32.0–36.0)
MCV: 90.3 fL (ref 80.0–100.0)
MPV: 12.5 fL (ref 7.5–12.5)
PLATELETS: 114 10*3/uL — AB (ref 140–400)
RBC: 3.81 10*6/uL — AB (ref 4.20–5.80)
RDW: 13.7 % (ref 11.0–15.0)
WBC: 3.7 10*3/uL — AB (ref 3.8–10.8)

## 2017-09-13 LAB — VITAMIN D 25 HYDROXY (VIT D DEFICIENCY, FRACTURES): VIT D 25 HYDROXY: 20 ng/mL — AB (ref 30–100)

## 2017-09-19 ENCOUNTER — Encounter: Payer: Self-pay | Admitting: Family Medicine

## 2017-09-19 ENCOUNTER — Ambulatory Visit (INDEPENDENT_AMBULATORY_CARE_PROVIDER_SITE_OTHER): Payer: Medicare HMO | Admitting: Family Medicine

## 2017-09-19 VITALS — BP 128/74 | HR 74 | Resp 16 | Ht 65.0 in | Wt 123.0 lb

## 2017-09-19 DIAGNOSIS — E10649 Type 1 diabetes mellitus with hypoglycemia without coma: Secondary | ICD-10-CM | POA: Diagnosis not present

## 2017-09-19 DIAGNOSIS — D61818 Other pancytopenia: Secondary | ICD-10-CM

## 2017-09-19 DIAGNOSIS — I10 Essential (primary) hypertension: Secondary | ICD-10-CM

## 2017-09-19 DIAGNOSIS — N529 Male erectile dysfunction, unspecified: Secondary | ICD-10-CM

## 2017-09-19 NOTE — Patient Instructions (Addendum)
Wellness with nurse past due please schedule, pt needs memory evaluation done per  His sister, concern noted    Physical exam with MD in 4 months  [place] 3 times every day and commit to bedtime lantus every night    Sugar is better, congrats   Non fasting cmp and EGFR, HBA1C, , PSA non fast 1 week before MD visit in 4 months  Please start OTC vit D3 2000 IU once daily

## 2017-09-19 NOTE — Assessment & Plan Note (Signed)
Hb has improved, WBC and platelet count still low, if persists will send to heamatology

## 2017-09-19 NOTE — Assessment & Plan Note (Signed)
Slightly improved but still uncontrolled. Re educated re the needs to be compliant as far as diet and medication are concerned Jacob Barnes is reminded of the importance of commitment to daily physical activity for 30 minutes or more, as able and the need to limit carbohydrate intake to 30 to 60 grams per meal to help with blood sugar control.   The need to take medication as prescribed, test blood sugar as directed, and to call between visits if there is a concern that blood sugar is uncontrolled is also discussed.   Jacob Barnes is reminded of the importance of daily foot exam, annual eye examination, and good blood sugar, blood pressure and cholesterol control.  Diabetic Labs Latest Ref Rng & Units 09/12/2017 05/15/2017 12/06/2016 11/27/2016 10/10/2016  HbA1c <5.7 % of total Hgb 8.5(H) 8.8(H) - 8.1(H) -  Microalbumin mg/dL - 55.3 210.6(H) - -  Micro/Creat Ratio <30 mcg/mg creat - 249(H) 114.5(H) - -  Chol <200 mg/dL - 93 - 100 -  HDL >40 mg/dL - 37(L) - 48 -  Calc LDL mg/dL (calc) - 43 - 45 -  Triglycerides <150 mg/dL - 46 - 36 -  Creatinine 0.70 - 1.18 mg/dL - 1.50(H) - 1.15 1.51(H)   BP/Weight 09/19/2017 05/21/2017 12/05/2016 10/11/2016 10/10/2016 09/26/2016 1/44/8185  Systolic BP 631 497 026 378 - - 588  Diastolic BP 74 84 84 62 - - 83  Wt. (Lbs) 123 118 118 - 120 120 120  BMI 20.47 19.64 19.64 - 19.97 19.97 19.97   Foot/eye exam completion dates Latest Ref Rng & Units 10/09/2016 10/09/2016  Eye Exam No Retinopathy Retinopathy(A) Retinopathy(A)  Foot Form Completion - - -

## 2017-09-19 NOTE — Assessment & Plan Note (Signed)
Controlled, no change in medication DASH diet and commitment to daily physical activity for a minimum of 30 minutes discussed and encouraged, as a part of hypertension management. The importance of attaining a healthy weight is also discussed.  BP/Weight 09/19/2017 05/21/2017 12/05/2016 10/11/2016 10/10/2016 09/26/2016 3/75/4360  Systolic BP 677 034 035 248 - - 185  Diastolic BP 74 84 84 62 - - 83  Wt. (Lbs) 123 118 118 - 120 120 120  BMI 20.47 19.64 19.64 - 19.97 19.97 19.97

## 2017-09-19 NOTE — Assessment & Plan Note (Signed)
Hyperlipidemia:Low fat diet discussed and encouraged.   Lipid Panel  Lab Results  Component Value Date   CHOL 93 05/15/2017   HDL 37 (L) 05/15/2017   LDLCALC 43 05/15/2017   TRIG 46 05/15/2017   CHOLHDL 2.5 05/15/2017   Controlled, no change in medication

## 2017-09-19 NOTE — Progress Notes (Signed)
Jacob Barnes     MRN: [id number]      DOB: 06/04/1940   HPI Jacob Barnes is here for follow up and re-evaluation of chronic medical conditions, medication management and review of any available recent lab and radiology data.   The PT denies any adverse reactions to current medications since the last visit. Still taking his lantus irregularly and not eating regularly There are no new concerns.  There are no specific complaints   ROS Denies recent fever or chills. Denies sinus pressure, nasal congestion, ear pain or sore throat. Denies chest congestion, productive cough or wheezing. Denies chest pains, palpitations and leg swelling Denies abdominal pain, nausea, vomiting,diarrhea or constipation.   Denies dysuria, frequency, hesitancy or incontinence. Denies joint pain, swelling and limitation in mobility. Denies headaches, seizures, numbness, or tingling. Denies depression, anxiety or insomnia. Denies skin break down or rash.   PE  BP 128/74   Pulse 74   Resp 16   Ht 5\' 5"  (1.651 m)   Wt 123 lb (55.8 kg)   SpO2 98%   BMI 20.47 kg/m   Patient alert and oriented and in no cardiopulmonary distress.  HEENT: No facial asymmetry, EOMI,   oropharynx pink and moist.  Neck supple no JVD, no mass.  Chest: Clear to auscultation bilaterally.  CVS: S1, S2 no murmurs, no S3.Regular rate.  ABD: Soft non tender.   Ext: No edema  MS: Adequate ROM spine, shoulders, hips and knees.  Skin: Intact, no ulcerations or rash noted.  Psych: Good eye contact, normal affect. Memory intact not anxious or depressed appearing.  CNS: CN 2-12 intact, power,  normal throughout.no focal deficits noted.   Assessment & Plan  Essential hypertension Controlled, no change in medication DASH diet and commitment to daily physical activity for a minimum of 30 minutes discussed and encouraged, as a part of hypertension management. The importance of attaining a healthy weight is also  discussed.  BP/Weight 09/19/2017 05/21/2017 12/05/2016 10/11/2016 10/10/2016 09/26/2016 2/53/6644  Systolic BP 034 742 595 638 - - 756  Diastolic BP 74 84 84 62 - - 83  Wt. (Lbs) 123 118 118 - 120 120 120  BMI 20.47 19.64 19.64 - 19.97 19.97 19.97       Diabetes mellitus, insulin dependent (IDDM), uncontrolled ([place]) Slightly improved but still uncontrolled. Re educated re the needs to be compliant as far as diet and medication are concerned Jacob Barnes is reminded of the importance of commitment to daily physical activity for 30 minutes or more, as able and the need to limit carbohydrate intake to 30 to 60 grams per meal to help with blood sugar control.   The need to take medication as prescribed, test blood sugar as directed, and to call between visits if there is a concern that blood sugar is uncontrolled is also discussed.   Jacob Barnes is reminded of the importance of daily foot exam, annual eye examination, and good blood sugar, blood pressure and cholesterol control.  Diabetic Labs Latest Ref Rng & Units 09/12/2017 05/15/2017 12/06/2016 11/27/2016 10/10/2016  HbA1c <5.7 % of total Hgb 8.5(H) 8.8(H) - 8.1(H) -  Microalbumin mg/dL - 55.3 210.6(H) - -  Micro/Creat Ratio <30 mcg/mg creat - 249(H) 114.5(H) - -  Chol <200 mg/dL - 93 - 100 -  HDL >40 mg/dL - 37(L) - 48 -  Calc LDL mg/dL (calc) - 43 - 45 -  Triglycerides <150 mg/dL - 46 - 36 -  Creatinine 0.70 - 1.18 mg/dL -  1.50(H) - 1.15 1.51(H)   BP/Weight 09/19/2017 05/21/2017 12/05/2016 10/11/2016 10/10/2016 09/26/2016 1/61/0960  Systolic BP 454 098 119 147 - - 829  Diastolic BP 74 84 84 62 - - 83  Wt. (Lbs) 123 118 118 - 120 120 120  BMI 20.47 19.64 19.64 - 19.97 19.97 19.97   Foot/eye exam completion dates Latest Ref Rng & Units 10/09/2016 10/09/2016  Eye Exam No Retinopathy Retinopathy(A) Retinopathy(A)  Foot Form Completion - - -         Hyperlipidemia Hyperlipidemia:Low fat diet discussed and encouraged.   Lipid Panel  Lab Results   Component Value Date   CHOL 93 05/15/2017   HDL 37 (L) 05/15/2017   LDLCALC 43 05/15/2017   TRIG 46 05/15/2017   CHOLHDL 2.5 05/15/2017   Controlled, no change in medication     Other pancytopenia Hb has improved, WBC and platelet count still low, if persists will send to heamatology

## 2017-10-16 DIAGNOSIS — Z8249 Family history of ischemic heart disease and other diseases of the circulatory system: Secondary | ICD-10-CM | POA: Diagnosis not present

## 2017-10-16 DIAGNOSIS — I10 Essential (primary) hypertension: Secondary | ICD-10-CM | POA: Diagnosis not present

## 2017-10-16 DIAGNOSIS — Z833 Family history of diabetes mellitus: Secondary | ICD-10-CM | POA: Diagnosis not present

## 2017-10-16 DIAGNOSIS — D509 Iron deficiency anemia, unspecified: Secondary | ICD-10-CM | POA: Diagnosis not present

## 2017-10-16 DIAGNOSIS — E119 Type 2 diabetes mellitus without complications: Secondary | ICD-10-CM | POA: Diagnosis not present

## 2017-10-16 DIAGNOSIS — K219 Gastro-esophageal reflux disease without esophagitis: Secondary | ICD-10-CM | POA: Diagnosis not present

## 2017-10-16 DIAGNOSIS — Z89021 Acquired absence of right finger(s): Secondary | ICD-10-CM | POA: Diagnosis not present

## 2017-10-16 DIAGNOSIS — Z794 Long term (current) use of insulin: Secondary | ICD-10-CM | POA: Diagnosis not present

## 2017-11-05 ENCOUNTER — Ambulatory Visit: Payer: Medicare HMO

## 2018-01-14 DIAGNOSIS — E10649 Type 1 diabetes mellitus with hypoglycemia without coma: Secondary | ICD-10-CM | POA: Diagnosis not present

## 2018-01-14 DIAGNOSIS — I10 Essential (primary) hypertension: Secondary | ICD-10-CM | POA: Diagnosis not present

## 2018-01-14 DIAGNOSIS — N529 Male erectile dysfunction, unspecified: Secondary | ICD-10-CM | POA: Diagnosis not present

## 2018-01-15 LAB — HEMOGLOBIN A1C
EAG (MMOL/L): 11.4 (calc)
HEMOGLOBIN A1C: 8.8 %{Hb} — AB (ref <5.7)
MEAN PLASMA GLUCOSE: 206 (calc)

## 2018-01-15 LAB — COMPLETE METABOLIC PANEL WITH GFR
AG Ratio: 0.8 (calc) — ABNORMAL LOW (ref 1.0–2.5)
ALKALINE PHOSPHATASE (APISO): 87 U/L (ref 40–115)
ALT: 14 U/L (ref 9–46)
AST: 25 U/L (ref 10–35)
Albumin: 3.7 g/dL (ref 3.6–5.1)
BUN/Creatinine Ratio: 14 (calc) (ref 6–22)
BUN: 19 mg/dL (ref 7–25)
CALCIUM: 9.1 mg/dL (ref 8.6–10.3)
CO2: 28 mmol/L (ref 20–32)
CREATININE: 1.32 mg/dL — AB (ref 0.70–1.18)
Chloride: 104 mmol/L (ref 98–110)
GFR, EST NON AFRICAN AMERICAN: 51 mL/min/{1.73_m2} — AB (ref >=60)
GFR, Est African American: 59 mL/min/{1.73_m2} — ABNORMAL LOW (ref >=60)
GLUCOSE: 118 mg/dL (ref 65–139)
Globulin: 4.4 g/dL (calc) — ABNORMAL HIGH (ref 1.9–3.7)
Potassium: 4.5 mmol/L (ref 3.5–5.3)
Sodium: 137 mmol/L (ref 135–146)
Total Bilirubin: 0.4 mg/dL (ref 0.2–1.2)
Total Protein: 8.1 g/dL (ref 6.1–8.1)

## 2018-01-15 LAB — PSA: PSA: 1 ng/mL (ref <=4.0)

## 2018-01-21 ENCOUNTER — Ambulatory Visit (INDEPENDENT_AMBULATORY_CARE_PROVIDER_SITE_OTHER): Payer: Medicare HMO | Admitting: Family Medicine

## 2018-01-21 ENCOUNTER — Ambulatory Visit (INDEPENDENT_AMBULATORY_CARE_PROVIDER_SITE_OTHER): Payer: Medicare HMO

## 2018-01-21 ENCOUNTER — Ambulatory Visit: Payer: Medicare HMO

## 2018-01-21 VITALS — BP 138/80 | HR 87 | Resp 12 | Ht 65.0 in | Wt 123.4 lb

## 2018-01-21 VITALS — BP 128/80 | HR 72 | Resp 14 | Ht 65.0 in | Wt 123.4 lb

## 2018-01-21 DIAGNOSIS — IMO0001 Reserved for inherently not codable concepts without codable children: Secondary | ICD-10-CM

## 2018-01-21 DIAGNOSIS — I10 Essential (primary) hypertension: Secondary | ICD-10-CM | POA: Diagnosis not present

## 2018-01-21 DIAGNOSIS — E1165 Type 2 diabetes mellitus with hyperglycemia: Secondary | ICD-10-CM | POA: Diagnosis not present

## 2018-01-21 DIAGNOSIS — Z0000 Encounter for general adult medical examination without abnormal findings: Secondary | ICD-10-CM

## 2018-01-21 DIAGNOSIS — Z794 Long term (current) use of insulin: Secondary | ICD-10-CM | POA: Diagnosis not present

## 2018-01-21 DIAGNOSIS — L608 Other nail disorders: Secondary | ICD-10-CM

## 2018-01-21 MED ORDER — FAMOTIDINE 20 MG PO TABS: 20.0000 mg | ORAL_TABLET | Freq: Every day | ORAL | 5 refills | Status: DC

## 2018-01-21 MED ORDER — INSULIN GLARGINE 100 UNIT/ML SOLOSTAR PEN: PEN_INJECTOR | SUBCUTANEOUS | 99 refills | Status: DC

## 2018-01-21 NOTE — Patient Instructions (Addendum)
F/U in 3.5 month, call if you need me before  Please reconsider flu vaccine   Blood sugar io is not well controlled, please commit to daily lantus  insulin at 15 units also eat regularly   You are referred to Podiatrist Dr Blanch Media for foot care  Please call back with nameof eye specialist you want   Non fasting chem 7 and EGFr and HBa1C 1 week before follow up

## 2018-01-21 NOTE — Progress Notes (Signed)
Subjective:   Jacob Barnes is a 77 y.o. male who presents for Medicare Annual/Subsequent preventive examination.  Review of Systems:  Cardiac Risk Factors include: diabetes mellitus;dyslipidemia;hypertension;advanced age (>40mn, >>30women);male gender;microalbuminuria     Objective:    Vitals: BP 138/80    Pulse 87    Resp 12    Ht '5\' 5"'  (1.651 m)    Wt 123 lb 6.4 oz (56 kg)    SpO2 98%    BMI 20.53 kg/m   Body mass index is 20.53 kg/m.  Advanced Directives 01/21/2018 10/10/2016 09/01/2016 08/30/2016 01/30/2014 01/15/2014 12/11/2013  Does Patient Have a Medical Advance Directive? No No No No No No No  Would patient like information on creating a medical advance directive? Yes (ED - Information included in AVS) No - Patient declined No - Patient declined No - Patient declined No - patient declined information No - patient declined information No - patient declined information  Pre-existing out of facility DNR order (yellow form or pink MOST form) - - - - - - -    Tobacco Social History   Tobacco Use  Smoking Status Never Smoker  Smokeless Tobacco Never Used     Counseling given: Not Answered   Clinical Intake:  Pre-visit preparation completed: Yes  Pain : No/denies pain Pain Score: 0-No pain     BMI - recorded: 20.5 Nutritional Status: BMI of 19-24  Normal Nutritional Risks: None Diabetes: Yes CBG done?: No Did pt. bring in CBG monitor from home?: No  How often do you need to have someone help you when you read instructions, pamphlets, or other written materials from your doctor or pharmacy?: 4 - Often What is the last grade level you completed in school?: 12 grade  Interpreter Needed?: No     Past Medical History:  Diagnosis Date   Abrasion of right middle finger with infection    for 1 week,    Anemia    Arthritis    hands   Chronic kidney disease    Diabetes mellitus    says since 1979 type 2   GERD (gastroesophageal reflux disease)    Glaucoma    both eyes   History of kidney stones    Hyperlipemia    Hypertension    Rash    front abdomen no drainage   Past Surgical History:  Procedure Laterality Date   AMPUTATION Right 09/01/2016   Procedure: AMPUTATION RIGHT MIDDLE FINGER TIP;  Surgeon: BMcarthur Rossetti MD;  Location: [facility];  Service: Orthopedics;  Laterality: Right;   AMPUTATION Right 10/10/2016   Procedure: REPEAT IRRIGATION AND DEBRIDEMENT RIGHT MIDDLE FINGER WITH AMPUTATION THROUGH PROXIMAL PHALANX;  Surgeon: BMcarthur Rossetti MD;  Location: [facility]  Service: Orthopedics;  Laterality: Right;   APPENDECTOMY     COLONOSCOPY  09/06/2011   Procedure: COLONOSCOPY;  Surgeon: NRogene Houston MD;  Location: [facility];  Service: Endoscopy;  Laterality: N/A;  830   CYSTOSCOPY/RETROGRADE/URETEROSCOPY/STONE EXTRACTION WITH BASKET     ESOPHAGOGASTRODUODENOSCOPY N/A 01/15/2014   Procedure: ESOPHAGOGASTRODUODENOSCOPY (EGD);  Surgeon: NRogene Houston MD;  Location: [facility];  Service: Endoscopy;  Laterality: N/A;  200   ESOPHAGOGASTRODUODENOSCOPY N/A 04/22/2014   Procedure: ESOPHAGOGASTRODUODENOSCOPY (EGD);  Surgeon: NRogene Houston MD;  Location: [facility];  Service: Endoscopy;  Laterality: N/A;  240   EYE SURGERY Bilateral yrs ago   ioc for cataract   MALONEY DILATION N/A 01/15/2014   Procedure: MALONEY DILATION;  Surgeon: NMechele Dawley  Laural Golden, MD;  Location: [facility];  Service: Endoscopy;  Laterality: N/A;   MALONEY DILATION  04/22/2014   Procedure: Venia Minks DILATION;  Surgeon: Rogene Houston, MD;  Location: [facility];  Service: Endoscopy;;   surgery for left eye bleedf  2017   Family History  Problem Relation Age of Onset   Diabetes Mother    Diabetes Father    Heart disease Father    Diabetes Brother    Diabetes Brother    Diabetes Son    Social History   Socioeconomic History   Marital status: Married    Spouse name: Not on file   Number of children: Not on file    Years of education: Not on file   Highest education level: Not on file  Occupational History   Not on file  Social Needs   Financial resource strain: Not hard at all   Food insecurity:    Worry: Never true    Inability: Never true   Transportation needs:    Medical: No    Non-medical: No  Tobacco Use   Smoking status: Never Smoker   Smokeless tobacco: Never Used  Substance and Sexual Activity   Alcohol use: No   Drug use: No   Sexual activity: Yes  Lifestyle   Physical activity:    Days per week: 3 days    Minutes per session: 60 min   Stress: Not at all  Relationships   Social connections:    Talks on phone: More than three times a week    Gets together: More than three times a week    Attends religious service: 1 to 4 times per year    Active member of club or organization: No    Attends meetings of clubs or organizations: Never    Relationship status: Married  Other Topics Concern   Not on file  Social History Narrative   Not on file    Outpatient Encounter Medications as of 01/21/2018  Medication Sig   benazepril (LOTENSIN) 10 MG tablet Take 1 tablet (10 mg total) by mouth daily.   blood glucose meter kit and supplies Dispense based on patient and insurance preference. Use up to three times daily as directed. (dx E11.65).   Cholecalciferol (VITAMIN D PO) Take 400 Units by mouth daily.    ferrous sulfate 325 (65 FE) MG tablet Take 325 mg by mouth daily with breakfast.    glucose blood test strip Use as instructed up to four times daily dx e11.65   Insulin Glargine (LANTUS SOLOSTAR) 100 UNIT/ML Solostar Pen Inject 20 Units into the skin daily at 10 pm.   insulin regular (NOVOLIN R) 100 units/mL injection Inject 0.05 mLs (5 Units total) into the skin 3 (three) times daily before meals. Use as sliding scale   pantoprazole (PROTONIX) 40 MG tablet TAKE 1 TABLET EVERY DAY   No facility-administered encounter medications on file as of 01/21/2018.       Activities of Daily Living In your present state of health, do you have any difficulty performing the following activities: 01/21/2018  Hearing? N  Vision? Y  Difficulty concentrating or making decisions? N  Walking or climbing stairs? N  Dressing or bathing? N  Doing errands, shopping? N  Preparing Food and eating ? Y  Using the Toilet? N  In the past six months, have you accidently leaked urine? N  Do you have problems with loss of bowel control? Y  Managing your Medications? Y  Managing  your Finances? Y  Housekeeping or managing your Housekeeping? Y  Some recent data might be hidden    Patient Care Team: Fayrene Helper, MD as PCP - General Hayden Pedro, MD as Consulting Physician (Ophthalmology)   Assessment:   This is a routine wellness examination for [place].  Exercise Activities and Dietary recommendations Current Exercise Habits: Home exercise routine;The patient has a physically strenous job, but has no regular exercise apart from work., Type of exercise: walking, Time (Minutes): 30, Frequency (Times/Week): 3, Weekly Exercise (Minutes/Week): 90, Intensity: Moderate, Exercise limited by: None identified  Goals   None     Fall Risk Fall Risk  09/19/2017 05/21/2017 06/06/2016 03/18/2015 11/11/2014  Falls in the past year? No No Yes No No  Number falls in past yr: - - 1 - -   Is the patient's home free of loose throw rugs in walkways, pet beds, electrical cords, etc?   yes      Grab bars in the bathroom? yes      Handrails on the stairs?   yes      Adequate lighting?   yes  Timed Get Up and Go Performed:   Depression Screen PHQ 2/9 Scores 09/19/2017 05/21/2017 06/06/2016 03/18/2015  PHQ - 2 Score 0 0 0 0  PHQ- 9 Score - - - -    Cognitive Function     6CIT Screen 01/21/2018  What Year? 0 points  What month? 0 points  What time? 0 points  Count back from 20 0 points  Months in reverse 0 points  Repeat phrase 4 points  Total Score 4    Immunization  History  Administered Date(s) Administered   Influenza,inj,Quad PF,6+ Mos 03/17/2013, 03/17/2014, 03/18/2015, 01/04/2016, 12/05/2016   PPD Test 08/16/2010, 08/18/2010, 11/03/2013   Pneumococcal Conjugate-13 11/03/2013   Pneumococcal Polysaccharide-23 12/21/2008   Td 12/21/2008    Qualifies for Shingles Vaccine?   Screening Tests Health Maintenance  Topic Date Due   OPHTHALMOLOGY EXAM  10/09/2017   FOOT EXAM  12/05/2017   INFLUENZA VACCINE  08/09/2018 (Originally 11/08/2017)   HEMOGLOBIN A1C  07/16/2018   TETANUS/TDAP  12/22/2018   PNA vac Low Risk Adult  Completed   Cancer Screenings: Lung: Low Dose CT Chest recommended if Age 68-80 years, 30 pack-year currently smoking OR have quit w/in 15years. Patient does not qualify. Colorectal:   Additional Screenings:  Hepatitis C Screening:      Plan:     I have personally reviewed and noted the following in the patients chart:    Medical and social history  Use of alcohol, tobacco or illicit drugs   Current medications and supplements  Functional ability and status  Nutritional status  Physical activity  Advanced directives  List of other physicians  Hospitalizations, surgeries, and ER visits in previous 12 months  Vitals  Screenings to include cognitive, depression, and falls  Referrals and appointments  In addition, I have reviewed and discussed with patient certain preventive protocols, quality metrics, and best practice recommendations. A written personalized care plan for preventive services as well as general preventive health recommendations were provided to patient.     Duane Lope Rakes  01/21/2018

## 2018-01-21 NOTE — Patient Instructions (Addendum)
Jacob Barnes , Thank you for taking time to come for your Medicare Wellness Visit. I appreciate your ongoing commitment to your health goals. Please review the following plan we discussed and let me know if I can assist you in the future.   Screening recommendations/referrals: Colonoscopy: up to date  Recommended yearly ophthalmology/optometry visit for glaucoma screening and checkup Recommended yearly dental visit for hygiene and checkup  Vaccinations: Influenza vaccine: declined  Pneumococcal vaccine:up to date  Tdap vaccine: up to date  Shingles vaccine: up to date    Advanced directives: information given   Conditions/risks identified:   Next appointment: wellness visit in one year   Preventive Care 78 Years and Older, Male Preventive care refers to lifestyle choices and visits with your health care provider that can promote health and wellness. What does preventive care include?  A yearly physical exam. This is also called an annual well check.  Dental exams once or twice a year.  Routine eye exams. Ask your health care provider how often you should have your eyes checked.  Personal lifestyle choices, including:  Daily care of your teeth and gums.  Regular physical activity.  Eating a healthy diet.  Avoiding tobacco and drug use.  Limiting alcohol use.  Practicing safe sex.  Taking low doses of aspirin every day.  Taking vitamin and mineral supplements as recommended by your health care provider. What happens during an annual well check? The services and screenings done by your health care provider during your annual well check will depend on your age, overall health, lifestyle risk factors, and family history of disease. Counseling  Your health care provider may ask you questions about your:  Alcohol use.  Tobacco use.  Drug use.  Emotional well-being.  Home and relationship well-being.  Sexual activity.  Eating habits.  History of falls.  Memory  and ability to understand (cognition).  Work and work Statistician. Screening  You may have the following tests or measurements:  Height, weight, and BMI.  Blood pressure.  Lipid and cholesterol levels. These may be checked every 5 years, or more frequently if you are over 51 years old.  Skin check.  Lung cancer screening. You may have this screening every year starting at age 53 if you have a 30-pack-year history of smoking and currently smoke or have quit within the past 15 years.  Fecal occult blood test (FOBT) of the stool. You may have this test every year starting at age 38.  Flexible sigmoidoscopy or colonoscopy. You may have a sigmoidoscopy every 5 years or a colonoscopy every 10 years starting at age 70.  Prostate cancer screening. Recommendations will vary depending on your family history and other risks.  Hepatitis C blood test.  Hepatitis B blood test.  Sexually transmitted disease (STD) testing.  Diabetes screening. This is done by checking your blood sugar (glucose) after you have not eaten for a while (fasting). You may have this done every 1-3 years.  Abdominal aortic aneurysm (AAA) screening. You may need this if you are a current or former smoker.  Osteoporosis. You may be screened starting at age 61 if you are at high risk. Talk with your health care provider about your test results, treatment options, and if necessary, the need for more tests. Vaccines  Your health care provider may recommend certain vaccines, such as:  Influenza vaccine. This is recommended every year.  Tetanus, diphtheria, and acellular pertussis (Tdap, Td) vaccine. You may need a Td booster every 10 years.  Zoster vaccine. You may need this after age 61.  Pneumococcal 13-valent conjugate (PCV13) vaccine. One dose is recommended after age 42.  Pneumococcal polysaccharide (PPSV23) vaccine. One dose is recommended after age 62. Talk to your health care provider about which screenings  and vaccines you need and how often you need them. This information is not intended to replace advice given to you by your health care provider. Make sure you discuss any questions you have with your health care provider. Document Released: 04/23/2015 Document Revised: 12/15/2015 Document Reviewed: 01/26/2015 Elsevier Interactive Patient Education  2017 [employer] Prevention in the Home Falls can cause injuries. They can happen to people of all ages. There are many things you can do to make your home safe and to help prevent falls. What can I do on the outside of my home?  Regularly fix the edges of walkways and driveways and fix any cracks.  Remove anything that might make you trip as you walk through a door, such as a raised step or threshold.  Trim any bushes or trees on the path to your home.  Use bright outdoor lighting.  Clear any walking paths of anything that might make someone trip, such as rocks or tools.  Regularly check to see if handrails are loose or broken. Make sure that both sides of any steps have handrails.  Any raised decks and porches should have guardrails on the edges.  Have any leaves, snow, or ice cleared regularly.  Use sand or salt on walking paths during winter.  Clean up any spills in your garage right away. This includes oil or grease spills. What can I do in the bathroom?  Use night lights.  Install grab bars by the toilet and in the tub and shower. Do not use towel bars as grab bars.  Use non-skid mats or decals in the tub or shower.  If you need to sit down in the shower, use a plastic, non-slip stool.  Keep the floor dry. Clean up any water that spills on the floor as soon as it happens.  Remove soap buildup in the tub or shower regularly.  Attach bath mats securely with double-sided non-slip rug tape.  Do not have throw rugs and other things on the floor that can make you trip. What can I do in the bedroom?  Use night  lights.  Make sure that you have a light by your bed that is easy to reach.  Do not use any sheets or blankets that are too big for your bed. They should not hang down onto the floor.  Have a firm chair that has side arms. You can use this for support while you get dressed.  Do not have throw rugs and other things on the floor that can make you trip. What can I do in the kitchen?  Clean up any spills right away.  Avoid walking on wet floors.  Keep items that you use a lot in easy-to-reach places.  If you need to reach something above you, use a strong step stool that has a grab bar.  Keep electrical cords out of the way.  Do not use floor polish or wax that makes floors slippery. If you must use wax, use non-skid floor wax.  Do not have throw rugs and other things on the floor that can make you trip. What can I do with my stairs?  Do not leave any items on the stairs.  Make sure that there are  handrails on both sides of the stairs and use them. Fix handrails that are broken or loose. Make sure that handrails are as long as the stairways.  Check any carpeting to make sure that it is firmly attached to the stairs. Fix any carpet that is loose or worn.  Avoid having throw rugs at the top or bottom of the stairs. If you do have throw rugs, attach them to the floor with carpet tape.  Make sure that you have a light switch at the top of the stairs and the bottom of the stairs. If you do not have them, ask someone to add them for you. What else can I do to help prevent falls?  Wear shoes that:  Do not have high heels.  Have rubber bottoms.  Are comfortable and fit you well.  Are closed at the toe. Do not wear sandals.  If you use a stepladder:  Make sure that it is fully opened. Do not climb a closed stepladder.  Make sure that both sides of the stepladder are locked into place.  Ask someone to hold it for you, if possible.  Clearly mark and make sure that you can  see:  Any grab bars or handrails.  First and last steps.  Where the edge of each step is.  Use tools that help you move around (mobility aids) if they are needed. These include:  Canes.  Walkers.  Scooters.  Crutches.  Turn on the lights when you go into a dark area. Replace any light bulbs as soon as they burn out.  Set up your furniture so you have a clear path. Avoid moving your furniture around.  If any of your floors are uneven, fix them.  If there are any pets around you, be aware of where they are.  Review your medicines with your doctor. Some medicines can make you feel dizzy. This can increase your chance of falling. Ask your doctor what other things that you can do to help prevent falls. This information is not intended to replace advice given to you by your health care provider. Make sure you discuss any questions you have with your health care provider. Document Released: 01/21/2009 Document Revised: 09/02/2015 Document Reviewed: 05/01/2014 Elsevier Interactive Patient Education  2017 [employer].

## 2018-01-22 ENCOUNTER — Encounter: Payer: Medicare HMO | Admitting: Family Medicine

## 2018-01-27 ENCOUNTER — Encounter: Payer: Self-pay | Admitting: Family Medicine

## 2018-01-27 NOTE — Progress Notes (Signed)
Jacob Barnes     MRN: [id number]      DOB: Mar 24, 1941   HPI: Patient is in for annual physical exam. Diabetes management is adjusted and pt re educated as blood sugar remains uncontrolled, states always on the go, eating at varying times, doesn't feel hungry Recent labs, if available are reviewed. Immunization is reviewed , and he refuses the flu vaccine despite re education    PE; BP 128/80   Pulse 72   Resp 14   Ht 5\' 5"  (1.651 m)   Wt 123 lb 6 oz (56 kg)   SpO2 98%   BMI 20.53 kg/m     Pleasant male, alert and oriented x 3, in no cardio-pulmonary distress. Afebrile. HEENT No facial trauma or asymetry. Sinuses non tender. EOMI,  External ears normal, tympanic membranes clear. Oropharynx moist, no exudate. Neck: supple, no adenopathy,JVD or thyromegaly.No bruits.  Chest: Clear to ascultation bilaterally.No crackles or wheezes. Non tender to palpation  Breast: No asymetry,no masses. No nipple discharge or inversion. No axillary or supraclavicular adenopathy  Cardiovascular system; Heart sounds normal,  S1 and  S2 ,no S3.  No murmur, or thrill. Apical beat not displaced Peripheral pulses normal.  Abdomen: Soft, non tender, no organomegaly or masses. No bruits. Bowel sounds normal. No guarding, tenderness or rebound.      Musculoskeletal exam: Full ROM of spine, hips , shoulders and knees. No deformity ,swelling or crepitus noted. No muscle wasting or atrophy.   Neurologic: Cranial nerves 2 to 12 intact. Power, tone ,sensation and reflexes normal throughout. No disturbance in gait. No tremor.  Skin: Intact, no ulceration, erythema , scaling or rash noted. Pigmentation normal throughout  Psych; Normal mood and affect. Judgement and concentration normal   Assessment & Plan:  Annual physical exam Annual exam as documented. Counseling done  re healthy lifestyle involving commitment to 150 minutes exercise per week, heart healthy diet, and  attaining healthy weight.The importance of adequate sleep also discussed. Regular seat belt use and home safety, is also discussed. Changes in health habits are decided on by the patient with goals and time frames  set for achieving them. Immunization and cancer screening needs are specifically addressed at this visit.   Diabetes mellitus, insulin dependent (IDDM), uncontrolled [facility]) Jacob Barnes is reminded of the importance of commitment to daily physical activity for 30 minutes or more, as able and the need to limit carbohydrate intake to 30 to 60 grams per meal to help with blood sugar control.   The need to take medication as prescribed, test blood sugar as directed, and to call between visits if there is a concern that blood sugar is uncontrolled is also discussed.   Jacob Barnes is reminded of the importance of daily foot exam, annual eye examination, and good blood sugar, blood pressure and cholesterol control. Deteriorated due to non compliance with both diet and medication, re educated with the hope that there will be improvement Diabetic Labs Latest Ref Rng & Units 01/14/2018 09/12/2017 05/15/2017 12/06/2016 11/27/2016  HbA1c <5.7 % of total Hgb 8.8(H) 8.5(H) 8.8(H) - 8.1(H)  Microalbumin mg/dL - - 55.3 210.6(H) -  Micro/Creat Ratio <30 mcg/mg creat - - 249(H) 114.5(H) -  Chol <200 mg/dL - - 93 - 100  HDL >40 mg/dL - - 37(L) - 48  Calc LDL mg/dL (calc) - - 43 - 45  Triglycerides <150 mg/dL - - 46 - 36  Creatinine 0.70 - 1.18 mg/dL 1.32(H) - 1.50(H) - 1.15  BP/Weight 01/21/2018 01/21/2018 09/19/2017 05/21/2017 12/05/2016 3/0/1720 12/10/679  Systolic BP 661 969 409 828 675 198 -  Diastolic BP 80 80 74 84 84 62 -  Wt. (Lbs) 123.38 123.4 123 118 118 - 120  BMI 20.53 20.53 20.47 19.64 19.64 - 19.97   Foot/eye exam completion dates Latest Ref Rng & Units 01/21/2018 10/09/2016  Eye Exam No Retinopathy - Retinopathy(A)  Foot Form Completion - Done -

## 2018-01-27 NOTE — Assessment & Plan Note (Signed)

## 2018-01-27 NOTE — Assessment & Plan Note (Signed)
Jacob Barnes is reminded of the importance of commitment to daily physical activity for 30 minutes or more, as able and the need to limit carbohydrate intake to 30 to 60 grams per meal to help with blood sugar control.   The need to take medication as prescribed, test blood sugar as directed, and to call between visits if there is a concern that blood sugar is uncontrolled is also discussed.   Jacob Barnes is reminded of the importance of daily foot exam, annual eye examination, and good blood sugar, blood pressure and cholesterol control. Deteriorated due to non compliance with both diet and medication, re educated with the hope that there will be improvement Diabetic Labs Latest Ref Rng & Units 01/14/2018 09/12/2017 05/15/2017 12/06/2016 11/27/2016  HbA1c <5.7 % of total Hgb 8.8(H) 8.5(H) 8.8(H) - 8.1(H)  Microalbumin mg/dL - - 55.3 210.6(H) -  Micro/Creat Ratio <30 mcg/mg creat - - 249(H) 114.5(H) -  Chol <200 mg/dL - - 93 - 100  HDL >40 mg/dL - - 37(L) - 48  Calc LDL mg/dL (calc) - - 43 - 45  Triglycerides <150 mg/dL - - 46 - 36  Creatinine 0.70 - 1.18 mg/dL 1.32(H) - 1.50(H) - 1.15   BP/Weight 01/21/2018 01/21/2018 09/19/2017 05/21/2017 12/05/2016 12/15/2834 09/10/9474  Systolic BP 546 503 546 568 127 517 -  Diastolic BP 80 80 74 84 84 62 -  Wt. (Lbs) 123.38 123.4 123 118 118 - 120  BMI 20.53 20.53 20.47 19.64 19.64 - 19.97   Foot/eye exam completion dates Latest Ref Rng & Units 01/21/2018 10/09/2016  Eye Exam No Retinopathy - Retinopathy(A)  Foot Form Completion - Done -

## 2018-02-27 ENCOUNTER — Other Ambulatory Visit: Payer: Self-pay | Admitting: Family Medicine

## 2018-02-27 DIAGNOSIS — R69 Illness, unspecified: Secondary | ICD-10-CM | POA: Diagnosis not present

## 2018-03-27 ENCOUNTER — Other Ambulatory Visit: Payer: Self-pay

## 2018-03-27 MED ORDER — BENAZEPRIL HCL 10 MG PO TABS: 10.0000 mg | ORAL_TABLET | Freq: Every day | ORAL | 1 refills | Status: DC

## 2018-04-01 DIAGNOSIS — R69 Illness, unspecified: Secondary | ICD-10-CM | POA: Diagnosis not present

## 2018-04-11 ENCOUNTER — Encounter: Payer: Self-pay | Admitting: *Deleted

## 2018-04-30 DIAGNOSIS — Z794 Long term (current) use of insulin: Secondary | ICD-10-CM | POA: Diagnosis not present

## 2018-04-30 DIAGNOSIS — H409 Unspecified glaucoma: Secondary | ICD-10-CM | POA: Diagnosis not present

## 2018-04-30 DIAGNOSIS — E119 Type 2 diabetes mellitus without complications: Secondary | ICD-10-CM | POA: Diagnosis not present

## 2018-04-30 DIAGNOSIS — K219 Gastro-esophageal reflux disease without esophagitis: Secondary | ICD-10-CM | POA: Diagnosis not present

## 2018-04-30 DIAGNOSIS — I10 Essential (primary) hypertension: Secondary | ICD-10-CM | POA: Diagnosis not present

## 2018-04-30 DIAGNOSIS — Z89021 Acquired absence of right finger(s): Secondary | ICD-10-CM | POA: Diagnosis not present

## 2018-05-06 ENCOUNTER — Ambulatory Visit: Payer: Medicare HMO | Admitting: Family Medicine

## 2018-05-09 ENCOUNTER — Ambulatory Visit: Payer: Medicare HMO | Admitting: Family Medicine

## 2018-05-30 ENCOUNTER — Ambulatory Visit: Payer: Managed Care, Other (non HMO) | Admitting: Family Medicine

## 2018-06-05 DIAGNOSIS — H401134 Primary open-angle glaucoma, bilateral, indeterminate stage: Secondary | ICD-10-CM | POA: Diagnosis not present

## 2018-06-05 DIAGNOSIS — Z961 Presence of intraocular lens: Secondary | ICD-10-CM | POA: Diagnosis not present

## 2018-06-05 DIAGNOSIS — H52203 Unspecified astigmatism, bilateral: Secondary | ICD-10-CM | POA: Diagnosis not present

## 2018-06-05 DIAGNOSIS — E113393 Type 2 diabetes mellitus with moderate nonproliferative diabetic retinopathy without macular edema, bilateral: Secondary | ICD-10-CM | POA: Diagnosis not present

## 2018-06-05 DIAGNOSIS — M79672 Pain in left foot: Secondary | ICD-10-CM | POA: Diagnosis not present

## 2018-06-05 DIAGNOSIS — I739 Peripheral vascular disease, unspecified: Secondary | ICD-10-CM | POA: Diagnosis not present

## 2018-06-05 DIAGNOSIS — M79671 Pain in right foot: Secondary | ICD-10-CM | POA: Diagnosis not present

## 2018-06-17 DIAGNOSIS — Z794 Long term (current) use of insulin: Secondary | ICD-10-CM | POA: Diagnosis not present

## 2018-06-17 DIAGNOSIS — E1165 Type 2 diabetes mellitus with hyperglycemia: Secondary | ICD-10-CM | POA: Diagnosis not present

## 2018-06-17 DIAGNOSIS — I10 Essential (primary) hypertension: Secondary | ICD-10-CM | POA: Diagnosis not present

## 2018-06-18 ENCOUNTER — Ambulatory Visit: Payer: Medicare HMO | Admitting: Family Medicine

## 2018-06-18 DIAGNOSIS — R69 Illness, unspecified: Secondary | ICD-10-CM | POA: Diagnosis not present

## 2018-06-18 LAB — COMPLETE METABOLIC PANEL WITH GFR
AG Ratio: 0.9 (calc) — ABNORMAL LOW (ref 1.0–2.5)
ALBUMIN MSPROF: 3.7 g/dL (ref 3.6–5.1)
ALKALINE PHOSPHATASE (APISO): 88 U/L (ref 35–144)
ALT: 13 U/L (ref 9–46)
AST: 24 U/L (ref 10–35)
BUN / CREAT RATIO: 16 (calc) (ref 6–22)
BUN: 23 mg/dL (ref 7–25)
CO2: 29 mmol/L (ref 20–32)
CREATININE: 1.43 mg/dL — AB (ref 0.70–1.18)
Calcium: 9.1 mg/dL (ref 8.6–10.3)
Chloride: 104 mmol/L (ref 98–110)
GFR, EST AFRICAN AMERICAN: 54 mL/min/{1.73_m2} — AB (ref >=60)
GFR, Est Non African American: 47 mL/min/{1.73_m2} — ABNORMAL LOW (ref >=60)
GLOBULIN: 4.3 g/dL — AB (ref 1.9–3.7)
Glucose, Bld: 91 mg/dL (ref 65–99)
Potassium: 4.4 mmol/L (ref 3.5–5.3)
SODIUM: 138 mmol/L (ref 135–146)
TOTAL PROTEIN: 8 g/dL (ref 6.1–8.1)
Total Bilirubin: 0.3 mg/dL (ref 0.2–1.2)

## 2018-06-18 LAB — HEMOGLOBIN A1C
HEMOGLOBIN A1C: 8.1 %{Hb} — AB (ref <5.7)
MEAN PLASMA GLUCOSE: 186 (calc)
eAG (mmol/L): 10.3 (calc)

## 2018-06-20 ENCOUNTER — Other Ambulatory Visit (HOSPITAL_COMMUNITY)
Admission: RE | Admit: 2018-06-20 | Discharge: 2018-06-20 | Disposition: A | Discharge status: Home / Self Care | Payer: Medicare HMO | Source: Ambulatory Visit | Attending: Family Medicine | Admitting: Family Medicine

## 2018-06-20 ENCOUNTER — Other Ambulatory Visit: Payer: Self-pay

## 2018-06-20 ENCOUNTER — Encounter: Payer: Self-pay | Admitting: Family Medicine

## 2018-06-20 ENCOUNTER — Ambulatory Visit (INDEPENDENT_AMBULATORY_CARE_PROVIDER_SITE_OTHER): Payer: Medicare HMO | Admitting: Family Medicine

## 2018-06-20 VITALS — BP 140/80 | HR 75 | Temp 98.0°F | Resp 15 | Ht 65.0 in | Wt 128.0 lb

## 2018-06-20 DIAGNOSIS — E1165 Type 2 diabetes mellitus with hyperglycemia: Secondary | ICD-10-CM | POA: Diagnosis not present

## 2018-06-20 DIAGNOSIS — I10 Essential (primary) hypertension: Secondary | ICD-10-CM | POA: Diagnosis not present

## 2018-06-20 DIAGNOSIS — K219 Gastro-esophageal reflux disease without esophagitis: Secondary | ICD-10-CM | POA: Diagnosis not present

## 2018-06-20 DIAGNOSIS — Z794 Long term (current) use of insulin: Secondary | ICD-10-CM

## 2018-06-20 DIAGNOSIS — IMO0001 Reserved for inherently not codable concepts without codable children: Secondary | ICD-10-CM

## 2018-06-20 DIAGNOSIS — Z2821 Immunization not carried out because of patient refusal: Secondary | ICD-10-CM | POA: Diagnosis not present

## 2018-06-20 NOTE — Patient Instructions (Signed)
F/U in 13 weeks, call if you need me before    CONGRATS on improved blood sugar call if you need me before, now 8.1, aiming for 7.9  Or less then you will be perfect  Non fasting labs in 12 weeks / 3 days before next visit , HBA1C, chem7 and EGFR   Please leave urine for microalb  Today   We will add lipids to recent lab   pLEASE commit to taking benazepril one every morning, this will help your kidneys, and youer blood pressure

## 2018-06-21 DIAGNOSIS — Z2821 Immunization not carried out because of patient refusal: Secondary | ICD-10-CM | POA: Insufficient documentation

## 2018-06-21 LAB — MICROALBUMIN, URINE: Microalb, Ur: 127.6 ug/mL — ABNORMAL HIGH

## 2018-06-21 NOTE — Assessment & Plan Note (Signed)
uncontrolled due to non compliance, importance of same is stressed DASH diet and commitment to daily physical activity for a minimum of 30 minutes discussed and encouraged, as a part of hypertension management. The importance of attaining a healthy weight is also discussed.  BP/Weight 06/20/2018 01/21/2018 01/21/2018 09/19/2017 05/21/2017 8/63/8177 04/10/6577  Systolic BP 038 333 832 919 166 060 045  Diastolic BP 80 80 80 74 84 84 62  Wt. (Lbs) 128 123.38 123.4 123 118 118 -  BMI 21.3 20.53 20.53 20.47 19.64 19.64 -

## 2018-06-21 NOTE — Assessment & Plan Note (Signed)
Re educated and encouraged to get vaccine indicated, he again refuses

## 2018-06-21 NOTE — Progress Notes (Signed)
ALIN CHAVIRA     MRN: [id number]      DOB: Nov 13, 1940   HPI Mr. Colquhoun is here for follow up and re-evaluation of chronic medical conditions, medication management and review of any available recent lab and radiology data.  Preventive health is updated, specifically  Cancer screening and Immunization.   . The PT denies any adverse reactions to current medications since the last visit.  States he had blood sugar low earlier this morning, did not eat his usual dinner the night before Down to 50, otherwise has had no hypoglycemia  ROS Denies recent fever or chills. Denies sinus pressure, nasal congestion, ear pain or sore throat. Denies chest congestion, productive cough or wheezing. Denies chest pains, palpitations and leg swelling Denies abdominal pain, nausea, vomiting,diarrhea or constipation.   Denies dysuria, frequency, hesitancy or incontinence. Denies uncontrolled joint pain, swelling and limitation in mobility. Denies headaches, seizures, numbness, or tingling. Denies depression, anxiety or insomnia. Denies skin break down or rash.   PE  BP 140/80   Pulse 75   Temp 98 F (36.7 C) (Oral)   Resp 15   Ht 5\' 5"  (1.651 m)   Wt 128 lb (58.1 kg)   SpO2 99%   BMI 21.30 kg/m   Patient alert and oriented and in no cardiopulmonary distress.  HEENT: No facial asymmetry, EOMI,   oropharynx pink and moist.  Neck supple no JVD, no mass.  Chest: Clear to auscultation bilaterally.  CVS: S1, S2 no murmurs, no S3.Regular rate.  ABD: Soft non tender.   Ext: No edema  MS: Adequate though reduced  ROM spine, shoulders, hips and knees.  Skin: Intact, no ulcerations or rash noted.  Psych: Good eye contact, normal affect. Memory intact not anxious or depressed appearing.  CNS: CN 2-12 intact, power,  normal throughout.no focal deficits noted.   Assessment & Plan  Diabetes mellitus, insulin dependent (IDDM), uncontrolled ([place]) Improved bu still uncontrolled Mr. Medeiros is  reminded of the importance of commitment to daily physical activity for 30 minutes or more, as able and the need to limit carbohydrate intake to 30 to 60 grams per meal to help with blood sugar control.   The need to take medication as prescribed, test blood sugar as directed, and to call between visits if there is a concern that blood sugar is uncontrolled is also discussed.   Mr. Bitner is reminded of the importance of daily foot exam, annual eye examination, and good blood sugar, blood pressure and cholesterol control.  Diabetic Labs Latest Ref Rng & Units 06/20/2018 06/17/2018 01/14/2018 09/12/2017 05/15/2017  HbA1c <5.7 % of total Hgb - 8.1(H) 8.8(H) 8.5(H) 8.8(H)  Microalbumin Not Estab. ug/mL 127.6(H) - - - 55.3  Micro/Creat Ratio <30 mcg/mg creat - - - - 249(H)  Chol <200 mg/dL - - - - 93  HDL >40 mg/dL - - - - 37(L)  Calc LDL mg/dL (calc) - - - - 43  Triglycerides <150 mg/dL - - - - 46  Creatinine 0.70 - 1.18 mg/dL - 1.43(H) 1.32(H) - 1.50(H)   BP/Weight 06/20/2018 01/21/2018 01/21/2018 09/19/2017 05/21/2017 9/39/0300 12/10/3298  Systolic BP 762 263 335 456 256 389 373  Diastolic BP 80 80 80 74 84 84 62  Wt. (Lbs) 128 123.38 123.4 123 118 118 -  BMI 21.3 20.53 20.53 20.47 19.64 19.64 -   Foot/eye exam completion dates Latest Ref Rng & Units 01/21/2018 10/09/2016  Eye Exam No Retinopathy - Retinopathy(A)  Foot Form Completion -  Done -        Essential hypertension uncontrolled due to non compliance, importance of same is stressed DASH diet and commitment to daily physical activity for a minimum of 30 minutes discussed and encouraged, as a part of hypertension management. The importance of attaining a healthy weight is also discussed.  BP/Weight 06/20/2018 01/21/2018 01/21/2018 09/19/2017 05/21/2017 11/27/6013 09/08/5377  Systolic BP 432 761 470 929 574 734 037  Diastolic BP 80 80 80 74 84 84 62  Wt. (Lbs) 128 123.38 123.4 123 118 118 -  BMI 21.3 20.53 20.53 20.47 19.64 19.64 -        GERD (gastroesophageal reflux disease) Controlled, no change in medication   Influenza vaccine refused Re educated and encouraged to get vaccine indicated, he again refuses

## 2018-06-21 NOTE — Assessment & Plan Note (Signed)
Controlled, no change in medication  

## 2018-06-21 NOTE — Assessment & Plan Note (Signed)
Improved bu still uncontrolled Jacob Barnes is reminded of the importance of commitment to daily physical activity for 30 minutes or more, as able and the need to limit carbohydrate intake to 30 to 60 grams per meal to help with blood sugar control.   The need to take medication as prescribed, test blood sugar as directed, and to call between visits if there is a concern that blood sugar is uncontrolled is also discussed.   Jacob Barnes is reminded of the importance of daily foot exam, annual eye examination, and good blood sugar, blood pressure and cholesterol control.  Diabetic Labs Latest Ref Rng & Units 06/20/2018 06/17/2018 01/14/2018 09/12/2017 05/15/2017  HbA1c <5.7 % of total Hgb - 8.1(H) 8.8(H) 8.5(H) 8.8(H)  Microalbumin Not Estab. ug/mL 127.6(H) - - - 55.3  Micro/Creat Ratio <30 mcg/mg creat - - - - 249(H)  Chol <200 mg/dL - - - - 93  HDL >40 mg/dL - - - - 37(L)  Calc LDL mg/dL (calc) - - - - 43  Triglycerides <150 mg/dL - - - - 46  Creatinine 0.70 - 1.18 mg/dL - 1.43(H) 1.32(H) - 1.50(H)   BP/Weight 06/20/2018 01/21/2018 01/21/2018 09/19/2017 05/21/2017 09/30/6331 08/12/5623  Systolic BP 638 937 342 876 811 572 620  Diastolic BP 80 80 80 74 84 84 62  Wt. (Lbs) 128 123.38 123.4 123 118 118 -  BMI 21.3 20.53 20.53 20.47 19.64 19.64 -   Foot/eye exam completion dates Latest Ref Rng & Units 01/21/2018 10/09/2016  Eye Exam No Retinopathy - Retinopathy(A)  Foot Form Completion - Done -

## 2018-06-27 ENCOUNTER — Ambulatory Visit: Payer: Medicare HMO | Admitting: Family Medicine

## 2018-08-08 DIAGNOSIS — H401134 Primary open-angle glaucoma, bilateral, indeterminate stage: Secondary | ICD-10-CM | POA: Diagnosis not present

## 2018-09-05 ENCOUNTER — Encounter: Payer: Self-pay | Admitting: *Deleted

## 2018-09-19 ENCOUNTER — Ambulatory Visit (INDEPENDENT_AMBULATORY_CARE_PROVIDER_SITE_OTHER): Payer: Medicare HMO | Admitting: Family Medicine

## 2018-09-19 ENCOUNTER — Other Ambulatory Visit: Payer: Self-pay

## 2018-09-19 ENCOUNTER — Encounter: Payer: Self-pay | Admitting: Family Medicine

## 2018-09-19 VITALS — BP 140/80 | Ht 65.0 in | Wt 128.0 lb

## 2018-09-19 DIAGNOSIS — Z13220 Encounter for screening for lipoid disorders: Secondary | ICD-10-CM | POA: Diagnosis not present

## 2018-09-19 DIAGNOSIS — I10 Essential (primary) hypertension: Secondary | ICD-10-CM

## 2018-09-19 DIAGNOSIS — E1165 Type 2 diabetes mellitus with hyperglycemia: Secondary | ICD-10-CM | POA: Diagnosis not present

## 2018-09-19 DIAGNOSIS — IMO0001 Reserved for inherently not codable concepts without codable children: Secondary | ICD-10-CM

## 2018-09-19 DIAGNOSIS — K219 Gastro-esophageal reflux disease without esophagitis: Secondary | ICD-10-CM

## 2018-09-19 DIAGNOSIS — Z794 Long term (current) use of insulin: Secondary | ICD-10-CM | POA: Diagnosis not present

## 2018-09-19 MED ORDER — PANTOPRAZOLE SODIUM 40 MG PO TBEC: 40.0000 mg | DELAYED_RELEASE_TABLET | Freq: Every day | ORAL | 3 refills | Status: DC

## 2018-09-19 NOTE — Progress Notes (Signed)
Virtual Visit via Telephone Note  I connected with Jacob Barnes on 09/19/18 at 10:40 AM EDT by telephone and verified that I am speaking with the correct person using two identifiers.  Location: Patient: home Provider: office   I discussed the limitations, risks, security and privacy concerns of performing an evaluation and management service by telephone and the availability of in person appointments. I also discussed with the patient that there may be a patient responsible charge related to this service. The patient expressed understanding and agreed to proceed. This visit type is conducted due to national recommendations for restrictions regarding the COVID -19 Pandemic. Due to the patient's age and / or co morbidities, this format is felt to be most appropriate at this time without adequate follow up. The patient has no access to video technology/ had technical difficulties with video, requiring transitioning to audio format  only ( telephone ). All issues noted this document were discussed and addressed,no physical exam can be performed in this format.   History of Present Illness: Spoke with sister as well who is concerned that pt is becoming confused as he drove into her husband's car parked in their driveway. Did not test bG at the time but states he told her  The sugar that morning and it was over 100. She will get test supplies to test his sugar at home also Pt states Bg this am is in the 80's Denies recent fever or chills. Denies sinus pressure, nasal congestion, ear pain or sore throat. Denies chest congestion, productive cough or wheezing. Denies chest pains, palpitations and leg swelling Denies abdominal pain, nausea, vomiting,diarrhea or constipation.   Denies dysuria, frequency, hesitancy or incontinence. Denies joint pain, swelling and limitation in mobility. Denies headaches, seizures, numbness, or tingling. Denies depression, anxiety or insomnia. Denies skin break down or  rash. Ran into his brother in [place] truck parked in the yard, seems to         Observations/Objective: BP 140/80   Ht 5\' 5"  (1.651 m)   Wt 128 lb (58.1 kg)   BMI 21.30 kg/m  Good communication possible memory loss/ confusion, seems to be at baseline. Alert and oriented x 3 No signs of respiratory distress during sppech    Assessment and Plan:  Diabetes mellitus, insulin dependent (IDDM), uncontrolled ([place]) Jacob Barnes is reminded of the importance of commitment to daily physical activity for 30 minutes or more, as able and the need to limit carbohydrate intake to 30 to 60 grams per meal to help with blood sugar control.   The need to take medication as prescribed, test blood sugar as directed, and to call between visits if there is a concern that blood sugar is uncontrolled is also discussed.   Jacob Barnes is reminded of the importance of daily foot exam, annual eye examination, and good blood sugar, blood pressure and cholesterol control. Possible hypoglycemic episode. Updated lab needed at/ before next visit.   Diabetic Labs Latest Ref Rng & Units 06/20/2018 06/17/2018 01/14/2018 09/12/2017 05/15/2017  HbA1c <5.7 % of total Hgb - 8.1(H) 8.8(H) 8.5(H) 8.8(H)  Microalbumin Not Estab. ug/mL 127.6(H) - - - 55.3  Micro/Creat Ratio <30 mcg/mg creat - - - - 249(H)  Chol <200 mg/dL - - - - 93  HDL >40 mg/dL - - - - 37(L)  Calc LDL mg/dL (calc) - - - - 43  Triglycerides <150 mg/dL - - - - 46  Creatinine 0.70 - 1.18 mg/dL - 1.43(H) 1.32(H) - 1.50(H)  BP/Weight 09/19/2018 06/20/2018 01/21/2018 01/21/2018 09/19/2017 05/21/2017 10/08/4101  Systolic BP 013 143 888 757 972 820 601  Diastolic BP 80 80 80 80 74 84 84  Wt. (Lbs) 128 128 123.38 123.4 123 118 118  BMI 21.3 21.3 20.53 20.53 20.47 19.64 19.64   Foot/eye exam completion dates Latest Ref Rng & Units 01/21/2018 10/09/2016  Eye Exam No Retinopathy - Retinopathy(A)  Foot Form Completion - Done -        Essential hypertension Not at  goal DASH diet and commitment to daily physical activity for a minimum of 30 minutes discussed and encouraged, as a part of hypertension management. The importance of attaining a healthy weight is also discussed.  BP/Weight 09/19/2018 06/20/2018 01/21/2018 01/21/2018 09/19/2017 05/21/2017 5/61/5379  Systolic BP 432 761 470 929 574 734 037  Diastolic BP 80 80 80 80 74 84 84  Wt. (Lbs) 128 128 123.38 123.4 123 118 118  BMI 21.3 21.3 20.53 20.53 20.47 19.64 19.64       GERD (gastroesophageal reflux disease) Controlled, no change in medication    Follow Up Instructions:    I discussed the assessment and treatment plan with the patient. The patient was provided an opportunity to ask questions and all were answered. The patient agreed with the plan and demonstrated an understanding of the instructions.   The patient was advised to call back or seek an in-person evaluation if the symptoms worsen or if the condition fails to improve as anticipated.  I provided 25 minutes of non-face-to-face time during this encounter.   [name], MD

## 2018-09-19 NOTE — Patient Instructions (Addendum)
F/u in office with MMSE with MD in 2 to 3 weeks, call if you need me sooner  Please get CBC, lipid, cmp and eGFR, HBA1C, tSH, microalb at [facility] 09/23/2018 , PLEASE send  new order to lab ([facility])  I am staring medication for difficulty swallowing ( pantoprazole) and this is at your pharmacy. If this gets more difficult, then you will be referred to GI  Social distancing. Frequent hand washing with soap and water Keeping your hands off of your face. These 3 practices will help to keep both you and your community healthy during this time. Please practice them faithfully!  Thanks for choosing [facility], we consider it a privelige to serve you.

## 2018-09-20 ENCOUNTER — Encounter: Payer: Self-pay | Admitting: Family Medicine

## 2018-09-20 NOTE — Assessment & Plan Note (Signed)
Not at goal DASH diet and commitment to daily physical activity for a minimum of 30 minutes discussed and encouraged, as a part of hypertension management. The importance of attaining a healthy weight is also discussed.  BP/Weight 09/19/2018 06/20/2018 01/21/2018 01/21/2018 09/19/2017 05/21/2017 4/86/2824  Systolic BP 175 301 040 459 136 859 923  Diastolic BP 80 80 80 80 74 84 84  Wt. (Lbs) 128 128 123.38 123.4 123 118 118  BMI 21.3 21.3 20.53 20.53 20.47 19.64 19.64

## 2018-09-20 NOTE — Assessment & Plan Note (Signed)
[name] is reminded of the importance of commitment to daily physical activity for 30 minutes or more, as able and the need to limit carbohydrate intake to 30 to 60 grams per meal to help with blood sugar control.   The need to take medication as prescribed, test blood sugar as directed, and to call between visits if there is a concern that blood sugar is uncontrolled is also discussed.   [name] is reminded of the importance of daily foot exam, annual eye examination, and good blood sugar, blood pressure and cholesterol control. Possible hypoglycemic episode. Updated lab needed at/ before next visit.   Diabetic Labs Latest Ref Rng & Units 06/20/2018 06/17/2018 01/14/2018 09/12/2017 05/15/2017  HbA1c <5.7 % of total Hgb - 8.1(H) 8.8(H) 8.5(H) 8.8(H)  Microalbumin Not Estab. ug/mL 127.6(H) - - - 55.3  Micro/Creat Ratio <30 mcg/mg creat - - - - 249(H)  Chol <200 mg/dL - - - - 93  HDL >40 mg/dL - - - - 37(L)  Calc LDL mg/dL (calc) - - - - 43  Triglycerides <150 mg/dL - - - - 46  Creatinine 0.70 - 1.18 mg/dL - 1.43(H) 1.32(H) - 1.50(H)   BP/Weight 09/19/2018 06/20/2018 01/21/2018 01/21/2018 09/19/2017 05/21/2017 04/28/4172  Systolic BP 081 448 185 631 497 026 378  Diastolic BP 80 80 80 80 74 84 84  Wt. (Lbs) 128 128 123.38 123.4 123 118 118  BMI 21.3 21.3 20.53 20.53 20.47 19.64 19.64   Foot/eye exam completion dates Latest Ref Rng & Units 01/21/2018 10/09/2016  Eye Exam No Retinopathy - Retinopathy(A)  Foot Form Completion - Done -

## 2018-09-20 NOTE — Assessment & Plan Note (Signed)
Controlled, no change in medication  

## 2018-09-23 DIAGNOSIS — E1165 Type 2 diabetes mellitus with hyperglycemia: Secondary | ICD-10-CM | POA: Diagnosis not present

## 2018-09-23 DIAGNOSIS — I10 Essential (primary) hypertension: Secondary | ICD-10-CM | POA: Diagnosis not present

## 2018-09-23 DIAGNOSIS — Z13220 Encounter for screening for lipoid disorders: Secondary | ICD-10-CM | POA: Diagnosis not present

## 2018-09-23 DIAGNOSIS — Z794 Long term (current) use of insulin: Secondary | ICD-10-CM | POA: Diagnosis not present

## 2018-09-24 ENCOUNTER — Encounter: Payer: Self-pay | Admitting: Family Medicine

## 2018-09-24 LAB — CBC
HCT: 34.5 % — ABNORMAL LOW (ref 38.5–50.0)
Hemoglobin: 11.5 g/dL — ABNORMAL LOW (ref 13.2–17.1)
MCH: 31.3 pg (ref 27.0–33.0)
MCHC: 33.3 g/dL (ref 32.0–36.0)
MCV: 94 fL (ref 80.0–100.0)
MPV: 11.8 fL (ref 7.5–12.5)
Platelets: 161 10*3/uL (ref 140–400)
RBC: 3.67 10*6/uL — ABNORMAL LOW (ref 4.20–5.80)
RDW: 13.5 % (ref 11.0–15.0)
WBC: 3.9 10*3/uL (ref 3.8–10.8)

## 2018-09-24 LAB — LIPID PANEL
Cholesterol: 102 mg/dL (ref <200)
HDL: 41 mg/dL (ref >=40)
LDL Cholesterol (Calc): 48 mg/dL (calc)
Non-HDL Cholesterol (Calc): 61 mg/dL (calc) (ref <130)
Total CHOL/HDL Ratio: 2.5 (calc) (ref <5.0)
Triglycerides: 46 mg/dL (ref <150)

## 2018-09-24 LAB — TSH: TSH: 1.77 mIU/L (ref 0.40–4.50)

## 2018-09-24 LAB — COMPLETE METABOLIC PANEL WITH GFR
AG Ratio: 0.8 (calc) — ABNORMAL LOW (ref 1.0–2.5)
ALT: 13 U/L (ref 9–46)
AST: 25 U/L (ref 10–35)
Albumin: 3.7 g/dL (ref 3.6–5.1)
Alkaline phosphatase (APISO): 99 U/L (ref 35–144)
BUN/Creatinine Ratio: 16 (calc) (ref 6–22)
BUN: 23 mg/dL (ref 7–25)
CO2: 30 mmol/L (ref 20–32)
Calcium: 8.6 mg/dL (ref 8.6–10.3)
Chloride: 104 mmol/L (ref 98–110)
Creat: 1.43 mg/dL — ABNORMAL HIGH (ref 0.70–1.18)
GFR, Est African American: 54 mL/min/{1.73_m2} — ABNORMAL LOW (ref >=60)
GFR, Est Non African American: 46 mL/min/{1.73_m2} — ABNORMAL LOW (ref >=60)
Globulin: 4.5 g/dL (calc) — ABNORMAL HIGH (ref 1.9–3.7)
Glucose, Bld: 99 mg/dL (ref 65–99)
Potassium: 4.2 mmol/L (ref 3.5–5.3)
Sodium: 137 mmol/L (ref 135–146)
Total Bilirubin: 0.3 mg/dL (ref 0.2–1.2)
Total Protein: 8.2 g/dL — ABNORMAL HIGH (ref 6.1–8.1)

## 2018-09-24 LAB — MICROALBUMIN / CREATININE URINE RATIO
Creatinine, Urine: 73 mg/dL (ref 20–320)
Microalb Creat Ratio: 116 mcg/mg creat — ABNORMAL HIGH (ref <30)
Microalb, Ur: 8.5 mg/dL

## 2018-09-24 LAB — HEMOGLOBIN A1C
Hgb A1c MFr Bld: 7.6 % of total Hgb — ABNORMAL HIGH (ref <5.7)
Mean Plasma Glucose: 171 (calc)
eAG (mmol/L): 9.5 (calc)

## 2018-10-09 ENCOUNTER — Ambulatory Visit: Payer: Medicare HMO | Admitting: Family Medicine

## 2018-10-15 ENCOUNTER — Encounter: Payer: Self-pay | Admitting: Family Medicine

## 2018-10-15 ENCOUNTER — Other Ambulatory Visit: Payer: Self-pay

## 2018-10-15 ENCOUNTER — Ambulatory Visit (INDEPENDENT_AMBULATORY_CARE_PROVIDER_SITE_OTHER): Payer: Medicare HMO | Admitting: Family Medicine

## 2018-10-15 VITALS — BP 126/56 | HR 75 | Resp 12 | Ht 65.0 in | Wt 123.1 lb

## 2018-10-15 DIAGNOSIS — E1165 Type 2 diabetes mellitus with hyperglycemia: Secondary | ICD-10-CM

## 2018-10-15 DIAGNOSIS — I10 Essential (primary) hypertension: Secondary | ICD-10-CM | POA: Diagnosis not present

## 2018-10-15 DIAGNOSIS — N529 Male erectile dysfunction, unspecified: Secondary | ICD-10-CM

## 2018-10-15 DIAGNOSIS — K219 Gastro-esophageal reflux disease without esophagitis: Secondary | ICD-10-CM

## 2018-10-15 DIAGNOSIS — Z794 Long term (current) use of insulin: Secondary | ICD-10-CM

## 2018-10-15 DIAGNOSIS — E785 Hyperlipidemia, unspecified: Secondary | ICD-10-CM | POA: Diagnosis not present

## 2018-10-15 DIAGNOSIS — E1159 Type 2 diabetes mellitus with other circulatory complications: Secondary | ICD-10-CM

## 2018-10-15 DIAGNOSIS — IMO0001 Reserved for inherently not codable concepts without codable children: Secondary | ICD-10-CM

## 2018-10-15 DIAGNOSIS — Z13220 Encounter for screening for lipoid disorders: Secondary | ICD-10-CM

## 2018-10-15 MED ORDER — ROSUVASTATIN CALCIUM 5 MG PO TABS: 5.0000 mg | ORAL_TABLET | Freq: Every day | ORAL | 3 refills | Status: DC

## 2018-10-15 NOTE — Patient Instructions (Signed)
Annual; physical exam 3rd  week in Novemwebr, call if you need me before.  Excellent labs and exam, memory is nearly 100% and blood sugar is where it needs to be  New is cretor once daily for cholesterol and to protect your heart  Please commit to drinking  64 ounces water every day  Thanks for choosing [facility], we consider it a privelige to serve you.   HBa1C, fasting  Lipid, cmp and RGFr and PSA 1 week before visit  CONGRATULATIONS, keep it up!

## 2018-10-20 ENCOUNTER — Encounter: Payer: Self-pay | Admitting: Family Medicine

## 2018-10-20 DIAGNOSIS — E1159 Type 2 diabetes mellitus with other circulatory complications: Secondary | ICD-10-CM | POA: Insufficient documentation

## 2018-10-20 NOTE — Assessment & Plan Note (Signed)
Controlled, no change in medication. DASH diet and commitment to daily physical activity for a minimum of 30 minutes discussed and encouraged, as a part of hypertension management. The importance of attaining a healthy weight is also discussed.  BP/Weight 10/15/2018 09/19/2018 06/20/2018 01/21/2018 01/21/2018 09/19/2017 2/76/3943  Systolic BP 200 379 444 619 012 224 114  Diastolic BP 56 80 80 80 80 74 84  Wt. (Lbs) 123.08 128 128 123.38 123.4 123 118  BMI 20.48 21.3 21.3 20.53 20.53 20.47 19.64

## 2018-10-20 NOTE — Assessment & Plan Note (Signed)
Controlled, no change in medication  

## 2018-10-20 NOTE — Progress Notes (Signed)
Jacob Barnes     MRN: [id number]      DOB: [birth year]   HPI Jacob Barnes is here for follow up and re-evaluation of chronic medical conditions, medication management and review of any available recent lab and radiology data.  Preventive health is updated, specifically  Cancer screening and Immunization.   Questions or concerns regarding consultations or procedures which the PT has had in the interim are  addressed. The PT denies any adverse reactions to current medications since the last visit.  There are no new concerns.  There are no specific complaints  Denies polyuria, polydipsia, blurred vision , or hypoglycemic episodes.   ROS Denies recent fever or chills. Denies sinus pressure, nasal congestion, ear pain or sore throat. Denies chest congestion, productive cough or wheezing. Denies chest pains, palpitations and leg swelling Denies abdominal pain, nausea, vomiting,diarrhea or constipation.   Denies dysuria, frequency, hesitancy or incontinence. Denies joint pain, swelling and limitation in mobility. Denies headaches, seizures, numbness, or tingling. Denies depression, anxiety or insomnia. Denies skin break down or rash.   PE  BP (!) 126/56   Pulse 75   Resp 12   Ht 5\' 5"  (1.651 m)   Wt 123 lb 1.3 oz (55.8 kg)   SpO2 94%   BMI 20.48 kg/m   Patient alert and oriented and in no cardiopulmonary distress.  HEENT: No facial asymmetry, EOMI,   oropharynx pink and moist.  Neck supple no JVD, no mass.  Chest: Clear to auscultation bilaterally.  CVS: S1, S2 no murmurs, no S3.Regular rate.  ABD: Soft non tender.   Ext: No edema  MS: Adequate ROM spine, shoulders, hips and knees.  Skin: Intact, no ulcerations or rash noted.  Psych: Good eye contact, normal affect. Memory intact not anxious or depressed appearing.  CNS: CN 2-12 intact, power,  normal throughout.no focal deficits noted.   Assessment & Plan  Essential hypertension Controlled, no change in medication.  DASH diet and commitment to daily physical activity for a minimum of 30 minutes discussed and encouraged, as a part of hypertension management. The importance of attaining a healthy weight is also discussed.  BP/Weight 10/15/2018 09/19/2018 06/20/2018 01/21/2018 01/21/2018 09/19/2017 6/56/8127  Systolic BP 517 001 749 449 675 916 384  Diastolic BP 56 80 80 80 80 74 84  Wt. (Lbs) 123.08 128 128 123.38 123.4 123 118  BMI 20.48 21.3 21.3 20.53 20.53 20.47 19.64       Type 2 diabetes mellitus with vascular disease ([place]) Improved and currently controlled, he is applauded on thois Jacob Barnes is reminded of the importance of commitment to daily physical activity for 30 minutes or more, as able and the need to limit carbohydrate intake to 30 to 60 grams per meal to help with blood sugar control.   The need to take medication as prescribed, test blood sugar as directed, and to call between visits if there is a concern that blood sugar is uncontrolled is also discussed.   Jacob Barnes is reminded of the importance of daily foot exam, annual eye examination, and good blood sugar, blood pressure and cholesterol control.  Diabetic Labs Latest Ref Rng & Units 09/23/2018 06/20/2018 06/17/2018 01/14/2018 09/12/2017  HbA1c <5.7 % of total Hgb 7.6(H) - 8.1(H) 8.8(H) 8.5(H)  Microalbumin mg/dL 8.5 127.6(H) - - -  Micro/Creat Ratio <30 mcg/mg creat 116(H) - - - -  Chol <200 mg/dL 102 - - - -  HDL > OR = 40 mg/dL 41 - - - -  Calc LDL mg/dL (calc) 48 - - - -  Triglycerides <150 mg/dL 46 - - - -  Creatinine 0.70 - 1.18 mg/dL 1.43(H) - 1.43(H) 1.32(H) -   BP/Weight 10/15/2018 09/19/2018 06/20/2018 01/21/2018 01/21/2018 09/19/2017 8/56/9437  Systolic BP 005 259 102 890 228 406 986  Diastolic BP 56 80 80 80 80 74 84  Wt. (Lbs) 123.08 128 128 123.38 123.4 123 118  BMI 20.48 21.3 21.3 20.53 20.53 20.47 19.64   Foot/eye exam completion dates Latest Ref Rng & Units 01/21/2018 10/09/2016  Eye Exam No Retinopathy - Retinopathy(A)   Foot Form Completion - Done -        GERD (gastroesophageal reflux disease) Controlled, no change in medication

## 2018-10-20 NOTE — Assessment & Plan Note (Signed)
Improved and currently controlled, he is applauded on thois Jacob Barnes is reminded of the importance of commitment to daily physical activity for 30 minutes or more, as able and the need to limit carbohydrate intake to 30 to 60 grams per meal to help with blood sugar control.   The need to take medication as prescribed, test blood sugar as directed, and to call between visits if there is a concern that blood sugar is uncontrolled is also discussed.   Jacob Barnes is reminded of the importance of daily foot exam, annual eye examination, and good blood sugar, blood pressure and cholesterol control.  Diabetic Labs Latest Ref Rng & Units 09/23/2018 06/20/2018 06/17/2018 01/14/2018 09/12/2017  HbA1c <5.7 % of total Hgb 7.6(H) - 8.1(H) 8.8(H) 8.5(H)  Microalbumin mg/dL 8.5 127.6(H) - - -  Micro/Creat Ratio <30 mcg/mg creat 116(H) - - - -  Chol <200 mg/dL 102 - - - -  HDL > OR = 40 mg/dL 41 - - - -  Calc LDL mg/dL (calc) 48 - - - -  Triglycerides <150 mg/dL 46 - - - -  Creatinine 0.70 - 1.18 mg/dL 1.43(H) - 1.43(H) 1.32(H) -   BP/Weight 10/15/2018 09/19/2018 06/20/2018 01/21/2018 01/21/2018 09/19/2017 6/80/3212  Systolic BP 248 250 037 048 889 169 450  Diastolic BP 56 80 80 80 80 74 84  Wt. (Lbs) 123.08 128 128 123.38 123.4 123 118  BMI 20.48 21.3 21.3 20.53 20.53 20.47 19.64   Foot/eye exam completion dates Latest Ref Rng & Units 01/21/2018 10/09/2016  Eye Exam No Retinopathy - Retinopathy(A)  Foot Form Completion - Done -

## 2018-11-11 ENCOUNTER — Other Ambulatory Visit: Payer: Self-pay | Admitting: Family Medicine

## 2018-12-16 ENCOUNTER — Other Ambulatory Visit: Payer: Self-pay | Admitting: Family Medicine

## 2019-01-23 ENCOUNTER — Ambulatory Visit: Payer: Medicare HMO | Admitting: Family Medicine

## 2019-01-23 ENCOUNTER — Ambulatory Visit: Payer: Medicare HMO

## 2019-01-23 ENCOUNTER — Other Ambulatory Visit: Payer: Self-pay

## 2019-02-03 ENCOUNTER — Ambulatory Visit: Payer: Medicare HMO

## 2019-02-06 ENCOUNTER — Encounter: Payer: Medicare HMO | Admitting: Family Medicine

## 2019-02-06 ENCOUNTER — Other Ambulatory Visit: Payer: Self-pay

## 2019-02-10 ENCOUNTER — Encounter: Payer: Self-pay | Admitting: Family Medicine

## 2019-02-10 ENCOUNTER — Other Ambulatory Visit: Payer: Self-pay

## 2019-02-10 ENCOUNTER — Ambulatory Visit (INDEPENDENT_AMBULATORY_CARE_PROVIDER_SITE_OTHER): Payer: Medicare HMO | Admitting: Family Medicine

## 2019-02-10 VITALS — BP 126/56 | Ht 65.0 in | Wt 123.0 lb

## 2019-02-10 DIAGNOSIS — Z0000 Encounter for general adult medical examination without abnormal findings: Secondary | ICD-10-CM

## 2019-02-10 DIAGNOSIS — E119 Type 2 diabetes mellitus without complications: Secondary | ICD-10-CM | POA: Insufficient documentation

## 2019-02-10 DIAGNOSIS — Z794 Long term (current) use of insulin: Secondary | ICD-10-CM | POA: Insufficient documentation

## 2019-02-10 DIAGNOSIS — E113513 Type 2 diabetes mellitus with proliferative diabetic retinopathy with macular edema, bilateral: Secondary | ICD-10-CM | POA: Insufficient documentation

## 2019-02-10 DIAGNOSIS — Z961 Presence of intraocular lens: Secondary | ICD-10-CM | POA: Insufficient documentation

## 2019-02-10 DIAGNOSIS — H26499 Other secondary cataract, unspecified eye: Secondary | ICD-10-CM | POA: Insufficient documentation

## 2019-02-10 DIAGNOSIS — H547 Unspecified visual loss: Secondary | ICD-10-CM | POA: Insufficient documentation

## 2019-02-10 DIAGNOSIS — H4010X0 Unspecified open-angle glaucoma, stage unspecified: Secondary | ICD-10-CM | POA: Insufficient documentation

## 2019-02-10 DIAGNOSIS — H04123 Dry eye syndrome of bilateral lacrimal glands: Secondary | ICD-10-CM | POA: Insufficient documentation

## 2019-02-10 DIAGNOSIS — E11311 Type 2 diabetes mellitus with unspecified diabetic retinopathy with macular edema: Secondary | ICD-10-CM | POA: Insufficient documentation

## 2019-02-10 NOTE — Patient Instructions (Signed)
[name] , Thank you for taking time to come for your Medicare Wellness Visit. I appreciate your ongoing commitment to your health goals. Please review the following plan we discussed and let me know if I can assist you in the future.   Please continue to practice social distancing to keep you, your family, and our community safe. If you must go out, please wear a Mask and practice good handwashing.  Screening recommendations/referrals: Colonoscopy: No longer needed Recommended yearly ophthalmology/optometry visit for glaucoma screening and checkup Recommended yearly dental visit for hygiene and checkup  Vaccinations: Influenza vaccine: Needs at next appt  Pneumococcal vaccine: Completed Tdap vaccine: Up to date Shingles vaccine: Check insurance coverage  Advanced directives: Reported you do not have. We are happy to help you with this if you would like   Conditions/risks identified: Fall   Next appointment: 02/27/2019  Preventive Care 79 Years and Older, Male Preventive care refers to lifestyle choices and visits with your health care provider that can promote health and wellness. What does preventive care include?  A yearly physical exam. This is also called an annual well check.  Dental exams once or twice a year.  Routine eye exams. Ask your health care provider how often you should have your eyes checked.  Personal lifestyle choices, including:  Daily care of your teeth and gums.  Regular physical activity.  Eating a healthy diet.  Avoiding tobacco and drug use.  Limiting alcohol use.  Practicing safe sex.  Taking low doses of aspirin every day.  Taking vitamin and mineral supplements as recommended by your health care provider. What happens during an annual well check? The services and screenings done by your health care provider during your annual well check will depend on your age, overall health, lifestyle risk factors, and family history of disease.  Counseling  Your health care provider may ask you questions about your:  Alcohol use.  Tobacco use.  Drug use.  Emotional well-being.  Home and relationship well-being.  Sexual activity.  Eating habits.  History of falls.  Memory and ability to understand (cognition).  Work and work Statistician. Screening  You may have the following tests or measurements:  Height, weight, and BMI.  Blood pressure.  Lipid and cholesterol levels. These may be checked every 5 years, or more frequently if you are over 75 years old.  Skin check.  Lung cancer screening. You may have this screening every year starting at age 92 if you have a 30-pack-year history of smoking and currently smoke or have quit within the past 15 years.  Fecal occult blood test (FOBT) of the stool. You may have this test every year starting at age 35.  Flexible sigmoidoscopy or colonoscopy. You may have a sigmoidoscopy every 5 years or a colonoscopy every 10 years starting at age 47.  Prostate cancer screening. Recommendations will vary depending on your family history and other risks.  Hepatitis C blood test.  Hepatitis B blood test.  Sexually transmitted disease (STD) testing.  Diabetes screening. This is done by checking your blood sugar (glucose) after you have not eaten for a while (fasting). You may have this done every 1-3 years.  Abdominal aortic aneurysm (AAA) screening. You may need this if you are a current or former smoker.  Osteoporosis. You may be screened starting at age 13 if you are at high risk. Talk with your health care provider about your test results, treatment options, and if necessary, the need for more tests. Vaccines  Your health care provider may recommend certain vaccines, such as:  Influenza vaccine. This is recommended every year.  Tetanus, diphtheria, and acellular pertussis (Tdap, Td) vaccine. You may need a Td booster every 10 years.  Zoster vaccine. You may need this  after age 65.  Pneumococcal 13-valent conjugate (PCV13) vaccine. One dose is recommended after age 42.  Pneumococcal polysaccharide (PPSV23) vaccine. One dose is recommended after age 93. Talk to your health care provider about which screenings and vaccines you need and how often you need them. This information is not intended to replace advice given to you by your health care provider. Make sure you discuss any questions you have with your health care provider. Document Released: 04/23/2015 Document Revised: 12/15/2015 Document Reviewed: 01/26/2015 Elsevier Interactive Patient Education  2017 [place] Prevention in the Home Falls can cause injuries. They can happen to people of all ages. There are many things you can do to make your home safe and to help prevent falls. What can I do on the outside of my home?  Regularly fix the edges of walkways and driveways and fix any cracks.  Remove anything that might make you trip as you walk through a door, such as a raised step or threshold.  Trim any bushes or trees on the path to your home.  Use bright outdoor lighting.  Clear any walking paths of anything that might make someone trip, such as rocks or tools.  Regularly check to see if handrails are loose or broken. Make sure that both sides of any steps have handrails.  Any raised decks and porches should have guardrails on the edges.  Have any leaves, snow, or ice cleared regularly.  Use sand or salt on walking paths during winter.  Clean up any spills in your garage right away. This includes oil or grease spills. What can I do in the bathroom?  Use night lights.  Install grab bars by the toilet and in the tub and shower. Do not use towel bars as grab bars.  Use non-skid mats or decals in the tub or shower.  If you need to sit down in the shower, use a plastic, non-slip stool.  Keep the floor dry. Clean up any water that spills on the floor as soon as it happens.   Remove soap buildup in the tub or shower regularly.  Attach bath mats securely with double-sided non-slip rug tape.  Do not have throw rugs and other things on the floor that can make you trip. What can I do in the bedroom?  Use night lights.  Make sure that you have a light by your bed that is easy to reach.  Do not use any sheets or blankets that are too big for your bed. They should not hang down onto the floor.  Have a firm chair that has side arms. You can use this for support while you get dressed.  Do not have throw rugs and other things on the floor that can make you trip. What can I do in the kitchen?  Clean up any spills right away.  Avoid walking on wet floors.  Keep items that you use a lot in easy-to-reach places.  If you need to reach something above you, use a strong step stool that has a grab bar.  Keep electrical cords out of the way.  Do not use floor polish or wax that makes floors slippery. If you must use wax, use non-skid floor wax.  Do  not have throw rugs and other things on the floor that can make you trip. What can I do with my stairs?  Do not leave any items on the stairs.  Make sure that there are handrails on both sides of the stairs and use them. Fix handrails that are broken or loose. Make sure that handrails are as long as the stairways.  Check any carpeting to make sure that it is firmly attached to the stairs. Fix any carpet that is loose or worn.  Avoid having throw rugs at the top or bottom of the stairs. If you do have throw rugs, attach them to the floor with carpet tape.  Make sure that you have a light switch at the top of the stairs and the bottom of the stairs. If you do not have them, ask someone to add them for you. What else can I do to help prevent falls?  Wear shoes that:  Do not have high heels.  Have rubber bottoms.  Are comfortable and fit you well.  Are closed at the toe. Do not wear sandals.  If you use a  stepladder:  Make sure that it is fully opened. Do not climb a closed stepladder.  Make sure that both sides of the stepladder are locked into place.  Ask someone to hold it for you, if possible.  Clearly mark and make sure that you can see:  Any grab bars or handrails.  First and last steps.  Where the edge of each step is.  Use tools that help you move around (mobility aids) if they are needed. These include:  Canes.  Walkers.  Scooters.  Crutches.  Turn on the lights when you go into a dark area. Replace any light bulbs as soon as they burn out.  Set up your furniture so you have a clear path. Avoid moving your furniture around.  If any of your floors are uneven, fix them.  If there are any pets around you, be aware of where they are.  Review your medicines with your doctor. Some medicines can make you feel dizzy. This can increase your chance of falling. Ask your doctor what other things that you can do to help prevent falls. This information is not intended to replace advice given to you by your health care provider. Make sure you discuss any questions you have with your health care provider. Document Released: 01/21/2009 Document Revised: 09/02/2015 Document Reviewed: 05/01/2014 Elsevier Interactive Patient Education  2017 [employer].

## 2019-02-10 NOTE — Progress Notes (Signed)
Subjective:   Jacob Barnes is a 78 y.o. male who presents for Medicare Annual/Subsequent preventive examination.  Location of Patient: Home Location of Provider: Telehealth Consent was obtain for visit to be over via telehealth.   I verified that I am speaking with the correct person using two identifiers.    Review of Systems:  Cardiac Risk Factors include: advanced age (>47mn, >>34women);diabetes mellitus;dyslipidemia;hypertension;male gender;sedentary lifestyle     Objective:    Vitals: There were no vitals taken for this visit.  There is no height or weight on file to calculate BMI.  Advanced Directives 02/10/2019 01/21/2018 10/10/2016 09/01/2016 08/30/2016 01/30/2014 01/15/2014  Does Patient Have a Medical Advance Directive? _0  No No  Would patient like information on creating a medical advance directive? - Yes (ED - Information included in AVS) No - Patient declined No - Patient declined No - Patient declined No - patient declined information No - patient declined information  Pre-existing out of facility DNR order (yellow form or pink MOST form) - - - - - - -    Tobacco Social History   Tobacco Use  Smoking Status Never Smoker  Smokeless Tobacco Never Used     Counseling given: Not Answered   Clinical Intake:  Pre-visit preparation completed: No  Pain : No/denies pain     Nutritional Risks: None Diabetes: Yes CBG done?: Yes(77 home check)  How often do you need to have someone help you when you read instructions, pamphlets, or other written materials from your doctor or pharmacy?: 1 - Never What is the last grade level you completed in school?: 12  Interpreter Needed?: No     Past Medical History:  Diagnosis Date   Abrasion of right middle finger with infection    for 1 week,    Anemia    Arthritis    hands   Chronic kidney disease    Diabetes mellitus    says since 1979 type 2   GERD (gastroesophageal reflux disease)     Glaucoma    both eyes   History of kidney stones    Hyperlipemia    Hypertension    Rash    front abdomen no drainage   Past Surgical History:  Procedure Laterality Date   AMPUTATION Right 09/01/2016   Procedure: AMPUTATION RIGHT MIDDLE FINGER TIP;  Surgeon: BMcarthur Rossetti MD;  Location: [facility];  Service: Orthopedics;  Laterality: Right;   AMPUTATION Right 10/10/2016   Procedure: REPEAT IRRIGATION AND DEBRIDEMENT RIGHT MIDDLE FINGER WITH AMPUTATION THROUGH PROXIMAL PHALANX;  Surgeon: BMcarthur Rossetti MD;  [facility]  Service: Orthopedics;  Laterality: Right;   APPENDECTOMY     COLONOSCOPY  09/06/2011   Procedure: COLONOSCOPY;  Surgeon: NRogene Houston MD;  [facility]: [facility];  Service: Endoscopy;  Laterality: N/A;  830   CYSTOSCOPY/RETROGRADE/URETEROSCOPY/STONE EXTRACTION WITH BASKET     ESOPHAGOGASTRODUODENOSCOPY N/A 01/15/2014   Procedure: ESOPHAGOGASTRODUODENOSCOPY (EGD);  Surgeon: NRogene Houston MD;  [facility]: [facility];  Service: Endoscopy;  Laterality: N/A;  200   ESOPHAGOGASTRODUODENOSCOPY N/A 04/22/2014   Procedure: ESOPHAGOGASTRODUODENOSCOPY (EGD);  Surgeon: NRogene Houston MD;  [facility]: [facility];  Service: Endoscopy;  Laterality: N/A;  240   EYE SURGERY Bilateral yrs ago   ioc for cataract   MALONEY DILATION N/A 01/15/2014   Procedure: MALONEY DILATION;  Surgeon: NRogene Houston MD;  [facility]: [facility];  Service: Endoscopy;  Laterality: N/A;   MALONEY DILATION  04/22/2014   Procedure: Venia Minks DILATION;  Surgeon: Rogene Houston, MD;  Location: [facility];  Service: Endoscopy;;   surgery for left eye bleedf  2017   Family History  Problem Relation Age of Onset   Diabetes Mother    Diabetes Father    Heart disease Father    Diabetes Brother    Diabetes Brother    Diabetes Son    Social History   Socioeconomic History   Marital status: Married    Spouse name: Not on file   Number of children: Not on  file   Years of education: Not on file   Highest education level: Not on file  Occupational History   Not on file  Social Needs   Financial resource strain: Not hard at all   Food insecurity    Worry: Never true    Inability: Never true   Transportation needs    Medical: No    Non-medical: No  Tobacco Use   Smoking status: Never Smoker   Smokeless tobacco: Never Used  Substance and Sexual Activity   Alcohol use: No   Drug use: No   Sexual activity: Yes  Lifestyle   Physical activity    Days per week: 3 days    Minutes per session: 60 min   Stress: Not at all  Relationships   Social connections    Talks on phone: More than three times a week    Gets together: More than three times a week    Attends religious service: 1 to 4 times per year    Active member of club or organization: No    Attends meetings of clubs or organizations: Never    Relationship status: Married  Other Topics Concern   Not on file  Social History Narrative   Not on file    Outpatient Encounter Medications as of 02/10/2019  Medication Sig   benazepril (LOTENSIN) 10 MG tablet TAKE 1 TABLET BY MOUTH EVERY DAY   blood glucose meter kit and supplies Dispense based on patient and insurance preference. Use up to three times daily as directed. (dx E11.65).   famotidine (PEPCID) 20 MG tablet Take 1 tablet (20 mg total) by mouth daily.   ferrous sulfate 325 (65 FE) MG tablet Take 325 mg by mouth daily with breakfast.    Insulin Glargine (LANTUS SOLOSTAR) 100 UNIT/ML Solostar Pen Inject 15 units under skin every morning   Latanoprost 0.005 % EMUL Apply 1 drop to eye at bedtime.   NOVOLIN R 100 UNIT/ML injection INJECT .05 MLS INTO SKIN THREE TIMES DAILY BEFORE MEALS, USING SLIDING SCALE   ONETOUCH VERIO test strip USE as instrusted UP TO four times daily   pantoprazole (PROTONIX) 40 MG tablet Take 1 tablet (40 mg total) by mouth daily.   rosuvastatin (CRESTOR) 5 MG tablet Take 1  tablet (5 mg total) by mouth daily.   No facility-administered encounter medications on file as of 02/10/2019.     Activities of Daily Living In your present state of health, do you have any difficulty performing the following activities: 02/10/2019  Hearing? N  Vision? N  Difficulty concentrating or making decisions? N  Walking or climbing stairs? N  Dressing or bathing? N  Doing errands, shopping? N  Some recent data might be hidden    Patient Care Team: Fayrene Helper, MD as PCP - General Hayden Pedro, MD as Consulting Physician (Ophthalmology)   Assessment:   This is a routine wellness examination  for Nam.  Exercise Activities and Dietary recommendations Current Exercise Habits: Home exercise routine, Exercise limited by: cardiac condition(s)  Goals   None     Fall Risk Fall Risk  02/10/2019 10/15/2018 09/19/2018 06/20/2018 09/19/2017  Falls in the past year? 1 0 0 0 No  Number falls in past yr: 0 - 0 0 -  Injury with Fall? 0 0 0 0 -  Risk for fall due to : History of fall(s) - - - -  Follow up Falls evaluation completed;Education provided;Falls prevention discussed - - - -   Is the patient's home free of loose throw rugs in walkways, pet beds, electrical cords, etc?   yes      Grab bars in the bathroom? no      Handrails on the stairs?   yes      Adequate lighting?   yes  Depression Screen PHQ 2/9 Scores 02/10/2019 10/15/2018 09/19/2018 06/20/2018  PHQ - 2 Score 0 0 0 0  PHQ- 9 Score - - - -    Cognitive Function MMSE - Mini Mental State Exam 10/15/2018  Orientation to time 5  Orientation to Place 5  Registration 3  Attention/ Calculation 5  Recall 3  Language- name 2 objects 2  Language- repeat 1  Language- follow 3 step command 3  Language- read & follow direction 1  Write a sentence 1  Copy design 0  Total score 29     6CIT Screen 02/10/2019 01/21/2018  What Year? 0 points 0 points  What month? 0 points 0 points  What time? 0 points 0 points    Count back from 20 0 points 0 points  Months in reverse 0 points 0 points  Repeat phrase 0 points 4 points  Total Score 0 4    Immunization History  Administered Date(s) Administered   Influenza,inj,Quad PF,6+ Mos 03/17/2013, 03/17/2014, 03/18/2015, 01/04/2016, 12/05/2016   PPD Test 08/16/2010, 08/18/2010, 11/03/2013   Pneumococcal Conjugate-13 11/03/2013   Pneumococcal Polysaccharide-23 12/21/2008   Td 12/21/2008    Qualifies for Shingles Vaccine? declined  Screening Tests Health Maintenance  Topic Date Due   OPHTHALMOLOGY EXAM  10/09/2017   INFLUENZA VACCINE  11/09/2018   TETANUS/TDAP  12/22/2018   FOOT EXAM  01/28/2019   HEMOGLOBIN A1C  03/25/2019   PNA vac Low Risk Adult  Completed   Cancer Screenings: Lung: Low Dose CT Chest recommended if Age 47-80 years, 30 pack-year currently smoking OR have quit w/in 15years. Patient does not qualify. Colorectal: no longer needed  Additional Screenings:  Hepatitis C Screening:       Plan:     1. Encounter for Medicare annual wellness exam I have personally reviewed and noted the following in the patients chart:    Medical and social history  Use of alcohol, tobacco or illicit drugs   Current medications and supplements  Functional ability and status  Nutritional status  Physical activity  Advanced directives  List of other physicians  Hospitalizations, surgeries, and ER visits in previous 12 months  Vitals  Screenings to include cognitive, depression, and falls  Referrals and appointments  In addition, I have reviewed and discussed with patient certain preventive protocols, quality metrics, and best practice recommendations. A written personalized care plan for preventive services as well as general preventive health recommendations were provided to patient.     I provided 20 minutes of non-face-to-face time during this encounter.   Perlie Mayo, NP  02/10/2019

## 2019-02-19 ENCOUNTER — Encounter: Payer: Self-pay | Admitting: Family Medicine

## 2019-02-19 LAB — LIPID PANEL
Cholesterol: 82 mg/dL (ref <200)
HDL: 38 mg/dL — ABNORMAL LOW (ref >=40)
LDL Cholesterol (Calc): 34 mg/dL (calc)
Non-HDL Cholesterol (Calc): 44 mg/dL (calc) (ref <130)
Total CHOL/HDL Ratio: 2.2 (calc) (ref <5.0)
Triglycerides: 36 mg/dL (ref <150)

## 2019-02-19 LAB — COMPLETE METABOLIC PANEL WITH GFR
AG Ratio: 1 (calc) (ref 1.0–2.5)
ALT: 14 U/L (ref 9–46)
AST: 27 U/L (ref 10–35)
Albumin: 3.8 g/dL (ref 3.6–5.1)
Alkaline phosphatase (APISO): 75 U/L (ref 35–144)
BUN/Creatinine Ratio: 16 (calc) (ref 6–22)
BUN: 21 mg/dL (ref 7–25)
CO2: 28 mmol/L (ref 20–32)
Calcium: 8.8 mg/dL (ref 8.6–10.3)
Chloride: 107 mmol/L (ref 98–110)
Creat: 1.34 mg/dL — ABNORMAL HIGH (ref 0.70–1.18)
GFR, Est African American: 58 mL/min/{1.73_m2} — ABNORMAL LOW (ref >=60)
GFR, Est Non African American: 50 mL/min/{1.73_m2} — ABNORMAL LOW (ref >=60)
Globulin: 3.8 g/dL (calc) — ABNORMAL HIGH (ref 1.9–3.7)
Glucose, Bld: 150 mg/dL — ABNORMAL HIGH (ref 65–99)
Potassium: 4.4 mmol/L (ref 3.5–5.3)
Sodium: 141 mmol/L (ref 135–146)
Total Bilirubin: 0.4 mg/dL (ref 0.2–1.2)
Total Protein: 7.6 g/dL (ref 6.1–8.1)

## 2019-02-19 LAB — HEMOGLOBIN A1C
Hgb A1c MFr Bld: 8.3 % of total Hgb — ABNORMAL HIGH (ref <5.7)
Mean Plasma Glucose: 192 (calc)
eAG (mmol/L): 10.6 (calc)

## 2019-02-19 LAB — PSA: PSA: 0.9 ng/mL (ref <=4.0)

## 2019-02-27 ENCOUNTER — Other Ambulatory Visit: Payer: Self-pay

## 2019-02-27 ENCOUNTER — Encounter: Payer: Self-pay | Admitting: Family Medicine

## 2019-02-27 ENCOUNTER — Ambulatory Visit (INDEPENDENT_AMBULATORY_CARE_PROVIDER_SITE_OTHER): Payer: Medicare HMO | Admitting: Family Medicine

## 2019-02-27 VITALS — BP 130/70 | HR 71 | Temp 98.3°F | Resp 15 | Ht 65.0 in | Wt 127.0 lb

## 2019-02-27 DIAGNOSIS — Z794 Long term (current) use of insulin: Secondary | ICD-10-CM | POA: Diagnosis not present

## 2019-02-27 DIAGNOSIS — Z0001 Encounter for general adult medical examination with abnormal findings: Secondary | ICD-10-CM | POA: Diagnosis not present

## 2019-02-27 DIAGNOSIS — Z23 Encounter for immunization: Secondary | ICD-10-CM

## 2019-02-27 DIAGNOSIS — Z0000 Encounter for general adult medical examination without abnormal findings: Secondary | ICD-10-CM

## 2019-02-27 DIAGNOSIS — E113513 Type 2 diabetes mellitus with proliferative diabetic retinopathy with macular edema, bilateral: Secondary | ICD-10-CM | POA: Diagnosis not present

## 2019-02-27 NOTE — Assessment & Plan Note (Signed)
Annual exam as documented. Counseling done  re healthy lifestyle involving commitment to 150 minutes exercise per week, heart healthy diet, and attaining healthy weight.The importance of adequate sleep also discussed. Changes in health habits are decided on by the patient with goals and time frames  set for achieving them. Immunization and cancer screening needs are specifically addressed at this visit. 

## 2019-02-27 NOTE — Assessment & Plan Note (Addendum)
Deteriorated,  Increase lantus to 158units daily Jacob Barnes is reminded of the importance of commitment to daily physical activity for 30 minutes or more, as able and the need to limit carbohydrate intake to 30 to 60 grams per meal to help with blood sugar control.   The need to take medication as prescribed, test blood sugar as directed, and to call between visits if there is a concern that blood sugar is uncontrolled is also discussed.   Jacob Barnes is reminded of the importance of daily foot exam, annual eye examination, and good blood sugar, blood pressure and cholesterol control.  Diabetic Labs Latest Ref Rng & Units 02/18/2019 09/23/2018 06/20/2018 06/17/2018 01/14/2018  HbA1c <5.7 % of total Hgb 8.3(H) 7.6(H) - 8.1(H) 8.8(H)  Microalbumin mg/dL - 8.5 127.6(H) - -  Micro/Creat Ratio <30 mcg/mg creat - 116(H) - - -  Chol <200 mg/dL 82 102 - - -  HDL > OR = 40 mg/dL 38(L) 41 - - -  Calc LDL mg/dL (calc) 34 48 - - -  Triglycerides <150 mg/dL 36 46 - - -  Creatinine 0.70 - 1.18 mg/dL 1.34(H) 1.43(H) - 1.43(H) 1.32(H)   BP/Weight 02/27/2019 02/10/2019 10/15/2018 09/19/2018 06/20/2018 01/21/2018 [id number]  Systolic BP [id number]  Diastolic BP 70 56 56 80 80 80 80  Wt. (Lbs) 127 123 123.08 128 128 123.38 123.4  BMI 21.13 20.47 20.48 21.3 21.3 20.53 20.53   Foot/eye exam completion dates Latest Ref Rng & Units 02/27/2019 01/21/2018  Eye Exam No Retinopathy - -  Foot Form Completion - Done Done

## 2019-02-27 NOTE — Patient Instructions (Addendum)
F/U with MD in 4 months, in office, call if you need me sooner   Flu vaccine todayhm dia  Vision screen today  Increase lantus to 18 units daily, reduce cookies and change to sugar free  We will send for your eye exam in [place], verify name with nurse before you leave today please  Foot exam shows reduced sensation and long nails, please examine daily nad call Podiatry to have nails cut  Non fasting hBA1C, chem 7 and EGFR 1 week before follow up  Thanks for choosing [facility], we consider it a privelige to serve you.

## 2019-02-27 NOTE — Progress Notes (Signed)
Jacob Barnes     MRN: [id number]      DOB: 02-06-1941   HPI: Patient is in for annual physical exam. Uncontrolled diabetes is addressed Recent labs,  are reviewed. Immunization is reviewed , and  Updated.    PE; BP 130/70   Pulse 71   Temp 98.3 F (36.8 C) (Temporal)   Resp 15   Ht 5\' 5"  (1.651 m)   Wt 127 lb (57.6 kg)   SpO2 100%   BMI 21.13 kg/m    Pleasant male, alert and oriented x 3, in no cardio-pulmonary distress. Afebrile. HEENT No facial trauma or asymetry. Sinuses non tender. EOMI External ears normal,  Neck: supple, no adenopathy,JVD or thyromegaly.No bruits.  Chest: Clear to ascultation bilaterally.No crackles or wheezes. Non tender to palpation  Cardiovascular system; Heart sounds normal,  S1 and  S2 ,no S3.  No murmur, or thrill. Apical beat not displaced Peripheral pulses normal.  Abdomen: Soft, non tender, no organomegaly or masses. No bruits. Bowel sounds normal. No guarding, tenderness or rebound.    Musculoskeletal exam: Full ROM of spine, hips , shoulders and knees. No deformity ,swelling or crepitus noted. No muscle wasting or atrophy.   Neurologic: Cranial nerves 2 to 12 intact. Power, tone ,and reflexes normal throughout. No disturbance in gait. No tremor.  Skin: Intact, no ulceration, erythema , scaling or rash noted. Pigmentation normal throughout  Psych; Normal mood and affect. Judgement and concentration normal   Assessment & Plan:  Annual physical exam Annual exam as documented. Counseling done  re healthy lifestyle involving commitment to 150 minutes exercise per week, heart healthy diet, and attaining healthy weight.The importance of adequate sleep also discussed. Changes in health habits are decided on by the patient with goals and time frames  set for achieving them. Immunization and cancer screening needs are specifically addressed at this visit.   Type 2 diabetes mellitus with proliferative retinopathy of  both eyes and macular edema (HCC) Deteriorated,  Increase lantus to 158units daily Jacob Barnes is reminded of the importance of commitment to daily physical activity for 30 minutes or more, as able and the need to limit carbohydrate intake to 30 to 60 grams per meal to help with blood sugar control.   The need to take medication as prescribed, test blood sugar as directed, and to call between visits if there is a concern that blood sugar is uncontrolled is also discussed.   Jacob Barnes is reminded of the importance of daily foot exam, annual eye examination, and good blood sugar, blood pressure and cholesterol control.  Diabetic Labs Latest Ref Rng & Units 02/18/2019 09/23/2018 06/20/2018 06/17/2018 01/14/2018  HbA1c <5.7 % of total Hgb 8.3(H) 7.6(H) - 8.1(H) 8.8(H)  Microalbumin mg/dL - 8.5 127.6(H) - -  Micro/Creat Ratio <30 mcg/mg creat - 116(H) - - -  Chol <200 mg/dL 82 102 - - -  HDL > OR = 40 mg/dL 38(L) 41 - - -  Calc LDL mg/dL (calc) 34 48 - - -  Triglycerides <150 mg/dL 36 46 - - -  Creatinine 0.70 - 1.18 mg/dL 1.34(H) 1.43(H) - 1.43(H) 1.32(H)   BP/Weight 02/27/2019 02/10/2019 10/15/2018 09/19/2018 06/20/2018 01/21/2018 [id number]  Systolic BP [id number] 0000000 0000000  Diastolic BP 70 56 56 80 80 80 80  Wt. (Lbs) 127 123 123.08 128 128 123.38 123.4  BMI 21.13 20.47 20.48 21.3 21.3 20.53 20.53   Foot/eye exam completion dates Latest Ref Rng & Units 02/27/2019 01/21/2018  Eye Exam No Retinopathy - -  Foot Form Completion - Done Done        Proliferative retinopathy with retinal edema due to type 2 diabetes mellitus (HCC) Deteriorated, increase doseof lantus Jacob Barnes is reminded of the importance of commitment to daily physical activity for 30 minutes or more, as able and the need to limit carbohydrate intake to 30 to 60 grams per meal to help with blood sugar control.   The need to take medication as prescribed, test blood sugar as directed, and to call between visits if there is a  concern that blood sugar is uncontrolled is also discussed.   Jacob Barnes is reminded of the importance of daily foot exam, annual eye examination, and good blood sugar, blood pressure and cholesterol control.  Diabetic Labs Latest Ref Rng & Units 02/18/2019 09/23/2018 06/20/2018 06/17/2018 01/14/2018  HbA1c <5.7 % of total Hgb 8.3(H) 7.6(H) - 8.1(H) 8.8(H)  Microalbumin mg/dL - 8.5 127.6(H) - -  Micro/Creat Ratio <30 mcg/mg creat - 116(H) - - -  Chol <200 mg/dL 82 102 - - -  HDL > OR = 40 mg/dL 38(L) 41 - - -  Calc LDL mg/dL (calc) 34 48 - - -  Triglycerides <150 mg/dL 36 46 - - -  Creatinine 0.70 - 1.18 mg/dL 1.34(H) 1.43(H) - 1.43(H) 1.32(H)   BP/Weight 02/27/2019 02/10/2019 10/15/2018 09/19/2018 06/20/2018 01/21/2018 [id number]  Systolic BP [id number] 0000000 0000000  Diastolic BP 70 56 56 80 80 80 80  Wt. (Lbs) 127 123 123.08 128 128 123.38 123.4  BMI 21.13 20.47 20.48 21.3 21.3 20.53 20.53   Foot/eye exam completion dates Latest Ref Rng & Units 02/27/2019 01/21/2018  Eye Exam No Retinopathy - -  Foot Form Completion - Done Done

## 2019-03-01 ENCOUNTER — Encounter: Payer: Self-pay | Admitting: Family Medicine

## 2019-03-01 MED ORDER — LANTUS SOLOSTAR 100 UNIT/ML ~~LOC~~ SOPN: 18.0000 [IU] | PEN_INJECTOR | Freq: Every day | SUBCUTANEOUS | 99 refills | Status: DC

## 2019-03-01 NOTE — Assessment & Plan Note (Signed)
Deteriorated, increase doseof lantus Jacob Barnes is reminded of the importance of commitment to daily physical activity for 30 minutes or more, as able and the need to limit carbohydrate intake to 30 to 60 grams per meal to help with blood sugar control.   The need to take medication as prescribed, test blood sugar as directed, and to call between visits if there is a concern that blood sugar is uncontrolled is also discussed.   Jacob Barnes is reminded of the importance of daily foot exam, annual eye examination, and good blood sugar, blood pressure and cholesterol control.  Diabetic Labs Latest Ref Rng & Units 02/18/2019 09/23/2018 06/20/2018 06/17/2018 01/14/2018  HbA1c <5.7 % of total Hgb 8.3(H) 7.6(H) - 8.1(H) 8.8(H)  Microalbumin mg/dL - 8.5 127.6(H) - -  Micro/Creat Ratio <30 mcg/mg creat - 116(H) - - -  Chol <200 mg/dL 82 102 - - -  HDL > OR = 40 mg/dL 38(L) 41 - - -  Calc LDL mg/dL (calc) 34 48 - - -  Triglycerides <150 mg/dL 36 46 - - -  Creatinine 0.70 - 1.18 mg/dL 1.34(H) 1.43(H) - 1.43(H) 1.32(H)   BP/Weight 02/27/2019 02/10/2019 10/15/2018 09/19/2018 06/20/2018 01/21/2018 [id number]  Systolic BP [id number] 0000000 0000000  Diastolic BP 70 56 56 80 80 80 80  Wt. (Lbs) 127 123 123.08 128 128 123.38 123.4  BMI 21.13 20.47 20.48 21.3 21.3 20.53 20.53   Foot/eye exam completion dates Latest Ref Rng & Units 02/27/2019 01/21/2018  Eye Exam No Retinopathy - -  Foot Form Completion - Done Done

## 2019-03-03 ENCOUNTER — Other Ambulatory Visit: Payer: Self-pay | Admitting: Family Medicine

## 2019-05-06 ENCOUNTER — Other Ambulatory Visit: Payer: Self-pay | Admitting: Family Medicine

## 2019-05-06 ENCOUNTER — Other Ambulatory Visit: Payer: Self-pay

## 2019-05-06 ENCOUNTER — Telehealth: Payer: Self-pay | Admitting: *Deleted

## 2019-05-06 MED ORDER — BENAZEPRIL HCL 10 MG PO TABS: 10.0000 mg | ORAL_TABLET | Freq: Every day | ORAL | 1 refills | Status: DC

## 2019-05-06 MED ORDER — ONETOUCH VERIO VI STRP: ORAL_STRIP | 2 refills | Status: DC

## 2019-05-06 MED ORDER — ROSUVASTATIN CALCIUM 5 MG PO TABS: 5.0000 mg | ORAL_TABLET | Freq: Every day | ORAL | 3 refills | Status: DC

## 2019-05-06 MED ORDER — FERROUS SULFATE 325 (65 FE) MG PO TABS: 325.0000 mg | ORAL_TABLET | Freq: Every day | ORAL | 1 refills | Status: DC

## 2019-05-06 MED ORDER — PANTOPRAZOLE SODIUM 40 MG PO TBEC: 40.0000 mg | DELAYED_RELEASE_TABLET | Freq: Every day | ORAL | 3 refills | Status: DC

## 2019-05-06 MED ORDER — LANTUS SOLOSTAR 100 UNIT/ML ~~LOC~~ SOPN: 18.0000 [IU] | PEN_INJECTOR | Freq: Every day | SUBCUTANEOUS | 99 refills | Status: DC

## 2019-05-06 NOTE — Telephone Encounter (Signed)
Medications refilled and sent to requested pharmacy

## 2019-05-06 NOTE — Telephone Encounter (Signed)
Jacob Barnes called and said all of [facility] [place] including his one touch verio test strips he was using a different pharmacy but needs them [facility] [place]

## 2019-06-24 ENCOUNTER — Ambulatory Visit: Payer: Medicare HMO | Admitting: Family Medicine

## 2019-06-25 ENCOUNTER — Other Ambulatory Visit: Payer: Self-pay | Admitting: Family Medicine

## 2019-06-25 DIAGNOSIS — G4482 Headache associated with sexual activity: Secondary | ICD-10-CM

## 2019-06-25 LAB — BASIC METABOLIC PANEL WITH GFR
BUN: 19 mg/dL (ref 7–25)
CO2: 30 mmol/L (ref 20–32)
Calcium: 9.1 mg/dL (ref 8.6–10.3)
Chloride: 102 mmol/L (ref 98–110)
Creat: 1.18 mg/dL (ref 0.70–1.18)
GFR, Est African American: 68 mL/min/{1.73_m2} (ref >=60)
GFR, Est Non African American: 58 mL/min/{1.73_m2} — ABNORMAL LOW (ref >=60)
Glucose, Bld: 93 mg/dL (ref 65–99)
Potassium: 4.2 mmol/L (ref 3.5–5.3)
Sodium: 139 mmol/L (ref 135–146)

## 2019-06-25 LAB — HEMOGLOBIN A1C
Hgb A1c MFr Bld: 8.6 % of total Hgb — ABNORMAL HIGH (ref <5.7)
Mean Plasma Glucose: 200 (calc)
eAG (mmol/L): 11.1 (calc)

## 2019-07-01 ENCOUNTER — Other Ambulatory Visit: Payer: Self-pay

## 2019-07-01 ENCOUNTER — Ambulatory Visit (INDEPENDENT_AMBULATORY_CARE_PROVIDER_SITE_OTHER): Payer: Medicare HMO | Admitting: Family Medicine

## 2019-07-01 ENCOUNTER — Encounter: Payer: Self-pay | Admitting: Family Medicine

## 2019-07-01 VITALS — BP 140/70 | HR 71 | Temp 97.8°F | Resp 15 | Ht 65.0 in | Wt 128.0 lb

## 2019-07-01 DIAGNOSIS — I10 Essential (primary) hypertension: Secondary | ICD-10-CM

## 2019-07-01 DIAGNOSIS — E785 Hyperlipidemia, unspecified: Secondary | ICD-10-CM | POA: Diagnosis not present

## 2019-07-01 DIAGNOSIS — E1159 Type 2 diabetes mellitus with other circulatory complications: Secondary | ICD-10-CM

## 2019-07-01 MED ORDER — LANTUS SOLOSTAR 100 UNIT/ML ~~LOC~~ SOPN: 15.0000 [IU] | PEN_INJECTOR | Freq: Every day | SUBCUTANEOUS | 1 refills | Status: DC

## 2019-07-01 NOTE — Assessment & Plan Note (Signed)
Jacob Barnes is encouraged to maintain a well balanced diet that is low in salt. Controlled, continue current medication regimen.  Additionally, he is also reminded that exercise is beneficial for heart health and control of  Blood pressure. 30-60 minutes daily is recommended-walking was suggested.

## 2019-07-01 NOTE — Patient Instructions (Signed)
I appreciate the opportunity to provide you with care for your health and wellness. Today we discussed: diabetes  Follow up: 3 months   Labs 1 week before next appt (fasting)  Avoid eating out as much as much as you can. If eating out get grilled, baked, or healthier food options off menu. Salads, veggie plates  Please continue to practice social distancing to keep you, your family, and our community safe.  If you must go out, please wear a mask and practice good handwashing.  It was a pleasure to see you and I look forward to continuing to work together on your health and well-being. Please do not hesitate to call the office if you need care or have questions about your care.  Have a wonderful day and week. With Gratitude, Cherly Beach, DNP, AGNP-BC

## 2019-07-01 NOTE — Assessment & Plan Note (Signed)
Continue medication, and encouraged low fat diet

## 2019-07-01 NOTE — Progress Notes (Signed)
Subjective:  Patient ID: Jacob Barnes, male    DOB: January 04, 1941  Age: 79 y.o. MRN: [id number]  CC:  Chief Complaint  Patient presents with  . Diabetes    follow up and lab review      HPI  HPI   Jacob Barnes is a 79 year old male patient of Dr. Griffin Dakin.  Presents today for follow-up on diabetes.  And to review his recent labs.  A1c is elevated 8.6% up from 8.3%.  He reports he knows exactly why that is going on.  He reports that he has been eating a lot more fast food as people are cooking as much at home.  He reports he does not do a lot of the cooking so he relies on all of the people in his home.  He also reports that his blood sugars have been dropping so unsure of how frequently he is taking his Lantus as scheduled.  Reports that he has been taking less than the recommended dose.  He reports his blood sugar sometimes drop into the 60s and 50s and he feels bad.  Reports that he is willing to take his medication daily if it was reduced dose.  Reports that he has had increase in salt in his diet as well.  Overall is just physically active and not overweight.  Reports taking all of his medications as directed and without issue.  Denies having any polyphagia, polydipsia, polyuria.  Does report increase numbers of hypoglycemia.  Denies having any urinary tract infections or skin infections.  Denies having any change in bowel or bladder habits.  Today patient denies signs and symptoms of COVID 19 infection including fever, chills, cough, shortness of breath, and headache. Past Medical, Surgical, Social History, Allergies, and Medications have been Reviewed.   Past Medical History:  Diagnosis Date  . Abrasion of right middle finger with infection    for 1 week,   . Anemia   . Arthritis    hands  . Chronic kidney disease   . Diabetes mellitus    says since 1979 type 2  . GERD (gastroesophageal reflux disease)   . Glaucoma    both eyes  . History of kidney stones   . Hyperlipemia     . Hypertension   . Rash    front abdomen no drainage    Current Meds  Medication Sig  . benazepril (LOTENSIN) 10 MG tablet Take 1 tablet (10 mg total) by mouth daily.  . blood glucose meter kit and supplies Dispense based on patient and insurance preference. Use up to three times daily as directed. (dx E11.65).  . ferrous sulfate 325 (65 FE) MG tablet Take 1 tablet (325 mg total) by mouth daily with breakfast.  . glucose blood (ONETOUCH VERIO) test strip USE as instrusted UP TO four times daily  . insulin glargine (LANTUS SOLOSTAR) 100 UNIT/ML Solostar Pen Inject 15 Units into the skin daily.  Marland Kitchen latanoprost (XALATAN) 0.005 % ophthalmic solution INSTILL ONE DROP IN [facility] EYE AT BEDTIME  . Latanoprost 0.005 % EMUL Apply 1 drop to eye at bedtime.  . pantoprazole (PROTONIX) 40 MG tablet Take 1 tablet (40 mg total) by mouth daily.  . rosuvastatin (CRESTOR) 5 MG tablet Take 1 tablet (5 mg total) by mouth daily.  . timolol (TIMOPTIC) 0.5 % ophthalmic solution INSTILL ONE DROP IN THE RIGHT EYE EVERY MORNING  . [DISCONTINUED] Insulin Glargine (LANTUS SOLOSTAR) 100 UNIT/ML Solostar Pen Inject 18 Units into the skin daily.  ROS:  Review of Systems  HENT: Negative.   Eyes: Negative.   Respiratory: Negative.   Cardiovascular: Negative.   Gastrointestinal: Negative.   Genitourinary: Negative.   Musculoskeletal: Negative.   Skin: Negative.   Neurological: Negative.   Endo/Heme/Allergies: Negative.   Psychiatric/Behavioral: Negative.   All other systems reviewed and are negative.    Objective:   Today's Vitals: BP 140/70   Pulse 71   Temp 97.8 F (36.6 C) (Temporal)   Resp 15   Ht '5\' 5"'  (1.651 m)   Wt 128 lb (58.1 kg)   SpO2 96%   BMI 21.30 kg/m  Vitals with BMI 07/01/2019 07/01/2019 02/27/2019  Height - '5\' 5"'  -  Weight - 127 lbs -  BMI - 51.7 -  Systolic [id number]  Diastolic 70 82 70  Pulse - 71 -     Physical Exam Vitals and nursing note reviewed.  Constitutional:       Appearance: Normal appearance. He is well-developed, well-groomed and normal weight.  HENT:     Head: Normocephalic and atraumatic.     Right Ear: External ear normal.     Left Ear: External ear normal.     Mouth/Throat:     Comments: Mask in place Eyes:     General:        Right eye: No discharge.        Left eye: No discharge.     Conjunctiva/sclera: Conjunctivae normal.     Comments: Glasses  Cardiovascular:     Rate and Rhythm: Normal rate and regular rhythm.     Pulses: Normal pulses.     Heart sounds: Normal heart sounds.  Pulmonary:     Effort: Pulmonary effort is normal.     Breath sounds: Normal breath sounds.  Musculoskeletal:        General: Normal range of motion.     Cervical back: Normal range of motion and neck supple.  Skin:    General: Skin is warm.  Neurological:     General: No focal deficit present.     Mental Status: He is alert and oriented to person, place, and time.  Psychiatric:        Attention and Perception: Attention normal.        Mood and Affect: Mood normal.        Speech: Speech normal.        Behavior: Behavior normal. Behavior is cooperative.        Thought Content: Thought content normal.        Cognition and Memory: Cognition normal.        Judgment: Judgment normal.     Assessment   1. Type 2 diabetes mellitus with vascular disease ([facility])   2. Hyperlipidemia, unspecified hyperlipidemia type   3. Essential hypertension     Tests ordered Orders Placed This Encounter  Procedures  . CBC  . COMPLETE METABOLIC PANEL WITH GFR  . Lipid panel  . Hemoglobin A1c     Plan: Please see assessment and plan per problem list above.   Meds ordered this encounter  Medications  . insulin glargine (LANTUS SOLOSTAR) 100 UNIT/ML Solostar Pen    Sig: Inject 15 Units into the skin daily.    Dispense:  5 pen    Refill:  1    Dose increase effective 03/01/2019    Order Specific Question:   Supervising Provider    Answer:   Fayrene Helper [[id number]]    Patient to follow-up  in 3 months.  Perlie Mayo, NP

## 2019-07-01 NOTE — Assessment & Plan Note (Signed)
Jacob Barnes is encouraged to check blood sugar daily as directed. Continue current medications. Is on statin as well. Educated on importance of maintain a well balanced diabetic friendly diet. He is reminded the importance of maintaining  good blood sugars,  taking medications as directed, daily foot care, annual eye exams. Additionally educated about keeping good control over blood pressure and cholesterol as well.

## 2019-07-17 ENCOUNTER — Other Ambulatory Visit: Payer: Self-pay

## 2019-07-17 ENCOUNTER — Telehealth: Payer: Self-pay

## 2019-07-17 DIAGNOSIS — E1159 Type 2 diabetes mellitus with other circulatory complications: Secondary | ICD-10-CM

## 2019-07-17 MED ORDER — LANTUS SOLOSTAR 100 UNIT/ML ~~LOC~~ SOPN: 15.0000 [IU] | PEN_INJECTOR | Freq: Every day | SUBCUTANEOUS | 5 refills | Status: DC

## 2019-07-17 NOTE — Telephone Encounter (Signed)
Lantus sent. Only insulin on list

## 2019-07-17 NOTE — Telephone Encounter (Signed)
[facility] [place] [address]

## 2019-10-01 LAB — CBC
HCT: 36.4 % — ABNORMAL LOW (ref 38.5–50.0)
Hemoglobin: 12.2 g/dL — ABNORMAL LOW (ref 13.2–17.1)
MCH: 31.6 pg (ref 27.0–33.0)
MCHC: 33.5 g/dL (ref 32.0–36.0)
MCV: 94.3 fL (ref 80.0–100.0)
MPV: 12.8 fL — ABNORMAL HIGH (ref 7.5–12.5)
Platelets: 118 10*3/uL — ABNORMAL LOW (ref 140–400)
RBC: 3.86 10*6/uL — ABNORMAL LOW (ref 4.20–5.80)
RDW: 13.6 % (ref 11.0–15.0)
WBC: 4.2 10*3/uL (ref 3.8–10.8)

## 2019-10-01 LAB — COMPLETE METABOLIC PANEL WITH GFR
AG Ratio: 0.9 (calc) — ABNORMAL LOW (ref 1.0–2.5)
ALT: 13 U/L (ref 9–46)
AST: 21 U/L (ref 10–35)
Albumin: 3.9 g/dL (ref 3.6–5.1)
Alkaline phosphatase (APISO): 80 U/L (ref 35–144)
BUN/Creatinine Ratio: 16 (calc) (ref 6–22)
BUN: 22 mg/dL (ref 7–25)
CO2: 30 mmol/L (ref 20–32)
Calcium: 9.2 mg/dL (ref 8.6–10.3)
Chloride: 104 mmol/L (ref 98–110)
Creat: 1.36 mg/dL — ABNORMAL HIGH (ref 0.70–1.11)
GFR, Est African American: 57 mL/min/{1.73_m2} — ABNORMAL LOW (ref >=60)
GFR, Est Non African American: 49 mL/min/{1.73_m2} — ABNORMAL LOW (ref >=60)
Globulin: 4.5 g/dL (calc) — ABNORMAL HIGH (ref 1.9–3.7)
Glucose, Bld: 101 mg/dL — ABNORMAL HIGH (ref 65–99)
Potassium: 4.6 mmol/L (ref 3.5–5.3)
Sodium: 140 mmol/L (ref 135–146)
Total Bilirubin: 0.5 mg/dL (ref 0.2–1.2)
Total Protein: 8.4 g/dL — ABNORMAL HIGH (ref 6.1–8.1)

## 2019-10-01 LAB — LIPID PANEL
Cholesterol: 114 mg/dL (ref <200)
HDL: 44 mg/dL (ref >=40)
LDL Cholesterol (Calc): 58 mg/dL (calc)
Non-HDL Cholesterol (Calc): 70 mg/dL (calc) (ref <130)
Total CHOL/HDL Ratio: 2.6 (calc) (ref <5.0)
Triglycerides: 42 mg/dL (ref <150)

## 2019-10-01 LAB — HEMOGLOBIN A1C
Hgb A1c MFr Bld: 9.4 % of total Hgb — ABNORMAL HIGH (ref <5.7)
Mean Plasma Glucose: 223 (calc)
eAG (mmol/L): 12.4 (calc)

## 2019-10-02 ENCOUNTER — Ambulatory Visit (INDEPENDENT_AMBULATORY_CARE_PROVIDER_SITE_OTHER): Payer: Medicare HMO | Admitting: Family Medicine

## 2019-10-02 ENCOUNTER — Encounter: Payer: Self-pay | Admitting: Family Medicine

## 2019-10-02 ENCOUNTER — Other Ambulatory Visit: Payer: Self-pay

## 2019-10-02 VITALS — BP 138/80 | HR 71 | Temp 96.9°F | Resp 16 | Ht 65.0 in | Wt 128.0 lb

## 2019-10-02 DIAGNOSIS — E1159 Type 2 diabetes mellitus with other circulatory complications: Secondary | ICD-10-CM | POA: Diagnosis not present

## 2019-10-02 DIAGNOSIS — E785 Hyperlipidemia, unspecified: Secondary | ICD-10-CM | POA: Diagnosis not present

## 2019-10-02 DIAGNOSIS — I10 Essential (primary) hypertension: Secondary | ICD-10-CM

## 2019-10-02 MED ORDER — METFORMIN HCL 500 MG PO TABS: 500.0000 mg | ORAL_TABLET | Freq: Two times a day (BID) | ORAL | 3 refills | Status: DC

## 2019-10-02 NOTE — Patient Instructions (Addendum)
F/U in 6 to 8 weeks with MD and blood sugar log  Start eating 3 times daily  Start metformin one with breakfast and one at dinner  Start lantus 10 units every morning, call if blood sugar is high in 2 to 3 weeks  Bring covid vaccination card and all medication you take to next visit  It is important that you exercise regularly at least 30 minutes 5 times a week. If you develop chest pain, have severe difficulty breathing, or feel very tired, stop exercising immediately and seek medical attention   Thanks for choosing [facility], we consider it a privelige to serve you.

## 2019-10-02 NOTE — Assessment & Plan Note (Signed)
Controlled, no change in medication DASH diet and commitment to daily physical activity for a minimum of 30 minutes discussed and encouraged, as a part of hypertension management. The importance of attaining a healthy weight is also discussed.  BP/Weight 10/02/2019 07/01/2019 02/27/2019 02/10/2019 10/15/2018 09/19/2018 0/34/7425  Systolic BP 956 387 564 332 951 884 166  Diastolic BP 80 70 70 56 56 80 80  Wt. (Lbs) 128 128 127 123 123.08 128 128  BMI 21.3 21.3 21.13 20.47 20.48 21.3 21.3

## 2019-10-02 NOTE — Assessment & Plan Note (Signed)
Deteriorated and uncontrolled, needs to eat on a regular schedule amd also  Have added metformin and reduced insulin dose to 10 units daily. He is to test, record and return in 6 weeks for re evaluation Jacob Barnes is reminded of the importance of commitment to daily physical activity for 30 minutes or more, as able and the need to limit carbohydrate intake to 30 to 60 grams per meal to help with blood sugar control.   The need to take medication as prescribed, test blood sugar as directed, and to call between visits if there is a concern that blood sugar is uncontrolled is also discussed.   Jacob Barnes is reminded of the importance of daily foot exam, annual eye examination, and good blood sugar, blood pressure and cholesterol control.  Diabetic Labs Latest Ref Rng & Units 09/30/2019 06/24/2019 02/18/2019 09/23/2018 06/20/2018  HbA1c <5.7 % of total Hgb 9.4(H) 8.6(H) 8.3(H) 7.6(H) -  Microalbumin mg/dL - - - 8.5 127.6(H)  Micro/Creat Ratio <30 mcg/mg creat - - - 116(H) -  Chol <200 mg/dL 114 - 82 102 -  HDL > OR = 40 mg/dL 44 - 38(L) 41 -  Calc LDL mg/dL (calc) 58 - 34 48 -  Triglycerides <150 mg/dL 42 - 36 46 -  Creatinine 0.70 - 1.11 mg/dL 1.36(H) 1.18 1.34(H) 1.43(H) -   BP/Weight 10/02/2019 07/01/2019 02/27/2019 02/10/2019 10/15/2018 09/19/2018 0/98/1191  Systolic BP 478 295 621 308 657 846 962  Diastolic BP 80 70 70 56 56 80 80  Wt. (Lbs) 128 128 127 123 123.08 128 128  BMI 21.3 21.3 21.13 20.47 20.48 21.3 21.3   Foot/eye exam completion dates Latest Ref Rng & Units 02/27/2019 01/21/2018  Eye Exam No Retinopathy - -  Foot Form Completion - Done Done

## 2019-10-02 NOTE — Assessment & Plan Note (Signed)
Hyperlipidemia:Low fat diet discussed and encouraged.   Lipid Panel  Lab Results  Component Value Date   CHOL 114 09/30/2019   HDL 44 09/30/2019   LDLCALC 58 09/30/2019   TRIG 42 09/30/2019   CHOLHDL 2.6 09/30/2019

## 2019-10-02 NOTE — Progress Notes (Signed)
Jacob Barnes     MRN: [id number]      DOB: [birth year]   HPI [name] is here for follow up and re-evaluation of chronic medical conditions, medication management and review of any available recent lab and radiology data.  Preventive health is updated, specifically  Cancer screening and Immunization.   [name] complains of blood sugar lows because he states he just does not feel like eating often.  He states he has breakfast but often misses lunch and many times when he checks his blood sugar in the morning it is about 90 so he is not taking insulin on any regular basis.  He denies polyuria polydipsia or blurred vision.  ROS Denies recent fever or chills. Denies sinus pressure, nasal congestion, ear pain or sore throat. Denies chest congestion, productive cough or wheezing. Denies chest pains, palpitations and leg swelling Denies abdominal pain, nausea, vomiting,diarrhea or constipation.   Denies dysuria, frequency, hesitancy or incontinence. Denies joint pain, swelling and limitation in mobility. Denies headaches, seizures, numbness, or tingling. Denies depression, anxiety or insomnia. Denies skin break down or rash.   PE  BP 138/80   Pulse 71   Temp (!) 96.9 F (36.1 C) (Temporal)   Resp 16   Ht 5\' 5"  (1.651 m)   Wt 128 lb (58.1 kg)   SpO2 98%   BMI 21.30 kg/m   Patient alert and oriented and in no cardiopulmonary distress.  HEENT: No facial asymmetry, EOMI,     Neck supple .  Chest: Clear to auscultation bilaterally.  CVS: S1, S2 no murmurs, no S3.Regular rate.  ABD: Soft non tender.   Ext: No edema  [id number] though adequate ROM spine, shoulders, hips and knees.  Skin: Intact, no ulcerations or rash noted.  Psych: Good eye contact, normal affect. Memory intact not anxious or depressed appearing.  CNS: CN 2-12 intact, power,  normal throughout.no focal deficits noted.   Assessment & Plan  Essential hypertension Controlled, no change in  medication DASH diet and commitment to daily physical activity for a minimum of 30 minutes discussed and encouraged, as a part of hypertension management. The importance of attaining a healthy weight is also discussed.  BP/Weight 10/02/2019 07/01/2019 02/27/2019 02/10/2019 10/15/2018 09/19/2018 0/93/2355  Systolic BP 732 202 542 706 237 628 315  Diastolic BP 80 70 70 56 56 80 80  Wt. (Lbs) 128 128 127 123 123.08 128 128  BMI 21.3 21.3 21.13 20.47 20.48 21.3 21.3       Type 2 diabetes mellitus with vascular disease (HCC) Deteriorated and uncontrolled, needs to eat on a regular schedule amd also  Have added metformin and reduced insulin dose to 10 units daily. He is to test, record and return in 6 weeks for re evaluation Jacob Barnes is reminded of the importance of commitment to daily physical activity for 30 minutes or more, as able and the need to limit carbohydrate intake to 30 to 60 grams per meal to help with blood sugar control.   The need to take medication as prescribed, test blood sugar as directed, and to call between visits if there is a concern that blood sugar is uncontrolled is also discussed.   Jacob Barnes is reminded of the importance of daily foot exam, annual eye examination, and good blood sugar, blood pressure and cholesterol control.  Diabetic Labs Latest Ref Rng & Units 09/30/2019 06/24/2019 02/18/2019 09/23/2018 06/20/2018  HbA1c <5.7 % of total Hgb 9.4(H) 8.6(H) 8.3(H) 7.6(H) -  Microalbumin  mg/dL - - - 8.5 127.6(H)  Micro/Creat Ratio <30 mcg/mg creat - - - 116(H) -  Chol <200 mg/dL 114 - 82 102 -  HDL > OR = 40 mg/dL 44 - 38(L) 41 -  Calc LDL mg/dL (calc) 58 - 34 48 -  Triglycerides <150 mg/dL 42 - 36 46 -  Creatinine 0.70 - 1.11 mg/dL 1.36(H) 1.18 1.34(H) 1.43(H) -   BP/Weight 10/02/2019 07/01/2019 02/27/2019 02/10/2019 10/15/2018 09/19/2018 1/59/4707  Systolic BP 615 183 437 357 897 847 841  Diastolic BP 80 70 70 56 56 80 80  Wt. (Lbs) 128 128 127 123 123.08 128 128  BMI 21.3  21.3 21.13 20.47 20.48 21.3 21.3   Foot/eye exam completion dates Latest Ref Rng & Units 02/27/2019 01/21/2018  Eye Exam No Retinopathy - -  Foot Form Completion - Done Done        Hyperlipidemia Hyperlipidemia:Low fat diet discussed and encouraged.   Lipid Panel  Lab Results  Component Value Date   CHOL 114 09/30/2019   HDL 44 09/30/2019   LDLCALC 58 09/30/2019   TRIG 42 09/30/2019   CHOLHDL 2.6 09/30/2019

## 2019-10-10 ENCOUNTER — Other Ambulatory Visit: Payer: Self-pay | Admitting: Family Medicine

## 2019-10-10 ENCOUNTER — Telehealth: Payer: Self-pay | Admitting: Family Medicine

## 2019-10-10 MED ORDER — GLIPIZIDE ER 2.5 MG PO TB24: 2.5000 mg | ORAL_TABLET | Freq: Every day | ORAL | 5 refills | Status: DC

## 2019-10-10 NOTE — Telephone Encounter (Signed)
Pt started this medication last Thursday and he has been having diarrhea. He cant even leave the house. He takes it twice a day. Devern pt sister was wondering if he could try something different than metformin so that he doesn't get dehydrated.

## 2019-10-10 NOTE — Telephone Encounter (Signed)
Pls advise his sister to have him stop the Metformin .    Let her know that I have sent in glipizide ER 2.5 mg 1 daily in its place.    It is important to take glipizide with the first meal he has in the morning.  Continue insulin as before , and nEEDS to eat regularly  Sorry the metformin caused so much GI distress, let him know!

## 2019-10-10 NOTE — Telephone Encounter (Signed)
Devern advised of medications changes with verbal understanding

## 2019-10-10 NOTE — Telephone Encounter (Signed)
LVM for Devern to return call to office for more information

## 2019-10-10 NOTE — Telephone Encounter (Signed)
Jacob Barnes wants to speak with the nurse about his Metformin, she has concerns that he will become dehydrated because of the Rx and is wanting to consult.

## 2019-11-24 ENCOUNTER — Ambulatory Visit: Payer: Medicare HMO | Admitting: Family Medicine

## 2019-12-02 ENCOUNTER — Other Ambulatory Visit: Payer: Self-pay | Admitting: Family Medicine

## 2019-12-02 ENCOUNTER — Encounter: Payer: Self-pay | Admitting: Family Medicine

## 2019-12-02 ENCOUNTER — Ambulatory Visit (INDEPENDENT_AMBULATORY_CARE_PROVIDER_SITE_OTHER): Payer: Medicare HMO | Admitting: Family Medicine

## 2019-12-02 ENCOUNTER — Other Ambulatory Visit: Payer: Self-pay

## 2019-12-02 VITALS — BP 124/80 | HR 66 | Resp 16 | Ht 65.0 in | Wt 127.0 lb

## 2019-12-02 DIAGNOSIS — I10 Essential (primary) hypertension: Secondary | ICD-10-CM

## 2019-12-02 DIAGNOSIS — K219 Gastro-esophageal reflux disease without esophagitis: Secondary | ICD-10-CM

## 2019-12-02 DIAGNOSIS — E1159 Type 2 diabetes mellitus with other circulatory complications: Secondary | ICD-10-CM | POA: Diagnosis not present

## 2019-12-02 DIAGNOSIS — Z23 Encounter for immunization: Secondary | ICD-10-CM | POA: Diagnosis not present

## 2019-12-02 DIAGNOSIS — E785 Hyperlipidemia, unspecified: Secondary | ICD-10-CM

## 2019-12-02 LAB — GLUCOSE, POCT (MANUAL RESULT ENTRY): POC Glucose: 149 mg/dl — AB (ref 70–99)

## 2019-12-02 MED ORDER — GLIPIZIDE ER 2.5 MG PO TB24: 2.5000 mg | ORAL_TABLET | Freq: Every day | ORAL | 1 refills | Status: DC

## 2019-12-02 MED ORDER — ROSUVASTATIN CALCIUM 5 MG PO TABS: ORAL_TABLET | ORAL | 1 refills | Status: DC

## 2019-12-02 NOTE — Progress Notes (Signed)
Jacob Barnes     MRN: [id number]      DOB: 1941-01-30   HPI Jacob Barnes is here for follow up and re-evaluation of chronic medical conditions, medication management and review of any available recent lab and radiology data.  Preventive health is updated, specifically  Cancer screening and Immunization.   Questions or concerns regarding consultations or procedures which the PT has had in the interim are  addressed. The PT denies any adverse reactions to current medications since the last visit.  Testing regularly, hads excellent daily readings FBG averaging about 110, lowest  recorded  ROS Denies recent fever or chills. Denies sinus pressure, nasal congestion, ear pain or sore throat. Denies chest congestion, productive cough or wheezing. Denies chest pains, palpitations and leg swelling Denies abdominal pain, nausea, vomiting,diarrhea or constipation.   Denies dysuria, frequency, hesitancy or incontinence. Denies joint pain, swelling and limitation in mobility. Denies headaches, seizures, numbness, or tingling. Denies depression, anxiety or insomnia. Denies skin break down or rash.   PE  BP 124/80   Pulse 66   Resp 16   Ht 5\' 5"  (1.651 m)   Wt 127 lb (57.6 kg)   SpO2 98%   BMI 21.13 kg/m   Patient alert and oriented and in no cardiopulmonary distress.  HEENT: No facial asymmetry, EOMI,     Neck supple .  Chest: Clear to auscultation bilaterally.  CVS: S1, S2 no murmurs, no S3.Regular rate.  ABD: Soft non tender.   Ext: No edema  MS: Adequate ROM spine, shoulders, hips and knees.  Skin: Intact, no ulcerations or rash noted.  Psych: Good eye contact, normal affect. Memory intact not anxious or depressed appearing.  CNS: CN 2-12 intact, power,  normal throughout.no focal deficits noted.   Assessment & Plan  Diabetes mellitus, insulin dependent (IDDM), uncontrolled ([place]) Jacob Barnes is reminded of the importance of commitment to daily physical activity for 30  minutes or more, as able and the need to limit carbohydrate intake to 30 to 60 grams per meal to help with blood sugar control.   The need to take medication as prescribed, test blood sugar as directed, and to call between visits if there is a concern that blood sugar is uncontrolled is also discussed.   Jacob Barnes is reminded of the importance of daily foot exam, annual eye examination, and good blood sugar, blood pressure and cholesterol control. Improving per patient  Record, update test  Diabetic Labs Latest Ref Rng & Units 09/30/2019 06/24/2019 02/18/2019 09/23/2018 06/20/2018  HbA1c <5.7 % of total Hgb 9.4(H) 8.6(H) 8.3(H) 7.6(H) -  Microalbumin mg/dL - - - 8.5 127.6(H)  Micro/Creat Ratio <30 mcg/mg creat - - - 116(H) -  Chol <200 mg/dL 114 - 82 102 -  HDL > OR = 40 mg/dL 44 - 38(L) 41 -  Calc LDL mg/dL (calc) 58 - 34 48 -  Triglycerides <150 mg/dL 42 - 36 46 -  Creatinine 0.70 - 1.11 mg/dL 1.36(H) 1.18 1.34(H) 1.43(H) -   BP/Weight 12/02/2019 10/02/2019 07/01/2019 02/27/2019 02/10/2019 10/15/2018 5/91/6384  Systolic BP 665 993 570 177 939 030 092  Diastolic BP 80 80 70 70 56 56 80  Wt. (Lbs) 127 128 128 127 123 123.08 128  BMI 21.13 21.3 21.3 21.13 20.47 20.48 21.3   Foot/eye exam completion dates Latest Ref Rng & Units 02/27/2019 01/21/2018  Eye Exam No Retinopathy - -  Foot Form Completion - Done Done        Essential  hypertension Controlled, no change in medication DASH diet and commitment to daily physical activity for a minimum of 30 minutes discussed and encouraged, as a part of hypertension management. The importance of attaining a healthy weight is also discussed.  BP/Weight 12/02/2019 10/02/2019 07/01/2019 02/27/2019 02/10/2019 10/15/2018 08/12/6977  Systolic BP 480 165 537 482 707 867 544  Diastolic BP 80 80 70 70 56 56 80  Wt. (Lbs) 127 128 128 127 123 123.08 128  BMI 21.13 21.3 21.3 21.13 20.47 20.48 21.3       Hyperlipidemia Hyperlipidemia:Low fat diet discussed and  encouraged.  Start crestor three times weekly   Lipid Panel  Lab Results  Component Value Date   CHOL 114 09/30/2019   HDL 44 09/30/2019   LDLCALC 58 09/30/2019   TRIG 42 09/30/2019   CHOLHDL 2.6 09/30/2019  Controlled, no change in medication      GERD (gastroesophageal reflux disease) Controlled, no change in medication

## 2019-12-02 NOTE — Patient Instructions (Addendum)
Physical exam in office with md early December , call if you need me before  Flu vaccine today  HBA1C, chem 7 and eGFR sept 24 or after, non fasting  Blood sugar is mUCH improved.  New is crestor 3 times weekly to protect your heart  Thanks for choosing [facility], we consider it a privelige to serve you.

## 2019-12-05 ENCOUNTER — Encounter: Payer: Self-pay | Admitting: Family Medicine

## 2019-12-05 NOTE — Assessment & Plan Note (Signed)
Controlled, no change in medication DASH diet and commitment to daily physical activity for a minimum of 30 minutes discussed and encouraged, as a part of hypertension management. The importance of attaining a healthy weight is also discussed.  BP/Weight 12/02/2019 10/02/2019 07/01/2019 02/27/2019 02/10/2019 10/15/2018 09/14/3014  Systolic BP 010 932 355 732 202 542 706  Diastolic BP 80 80 70 70 56 56 80  Wt. (Lbs) 127 128 128 127 123 123.08 128  BMI 21.13 21.3 21.3 21.13 20.47 20.48 21.3

## 2019-12-05 NOTE — Assessment & Plan Note (Signed)
Controlled, no change in medication  

## 2019-12-05 NOTE — Assessment & Plan Note (Addendum)
Hyperlipidemia:Low fat diet discussed and encouraged.  Start crestor three times weekly   Lipid Panel  Lab Results  Component Value Date   CHOL 114 09/30/2019   HDL 44 09/30/2019   LDLCALC 58 09/30/2019   TRIG 42 09/30/2019   CHOLHDL 2.6 09/30/2019  Controlled, no change in medication

## 2019-12-05 NOTE — Assessment & Plan Note (Signed)
Jacob Barnes is reminded of the importance of commitment to daily physical activity for 30 minutes or more, as able and the need to limit carbohydrate intake to 30 to 60 grams per meal to help with blood sugar control.   The need to take medication as prescribed, test blood sugar as directed, and to call between visits if there is a concern that blood sugar is uncontrolled is also discussed.   Jacob Barnes is reminded of the importance of daily foot exam, annual eye examination, and good blood sugar, blood pressure and cholesterol control. Improving per patient  Record, update test  Diabetic Labs Latest Ref Rng & Units 09/30/2019 06/24/2019 02/18/2019 09/23/2018 06/20/2018  HbA1c <5.7 % of total Hgb 9.4(H) 8.6(H) 8.3(H) 7.6(H) -  Microalbumin mg/dL - - - 8.5 127.6(H)  Micro/Creat Ratio <30 mcg/mg creat - - - 116(H) -  Chol <200 mg/dL 114 - 82 102 -  HDL > OR = 40 mg/dL 44 - 38(L) 41 -  Calc LDL mg/dL (calc) 58 - 34 48 -  Triglycerides <150 mg/dL 42 - 36 46 -  Creatinine 0.70 - 1.11 mg/dL 1.36(H) 1.18 1.34(H) 1.43(H) -   BP/Weight 12/02/2019 10/02/2019 07/01/2019 02/27/2019 02/10/2019 10/15/2018 0/78/6754  Systolic BP 492 010 071 219 758 832 549  Diastolic BP 80 80 70 70 56 56 80  Wt. (Lbs) 127 128 128 127 123 123.08 128  BMI 21.13 21.3 21.3 21.13 20.47 20.48 21.3   Foot/eye exam completion dates Latest Ref Rng & Units 02/27/2019 01/21/2018  Eye Exam No Retinopathy - -  Foot Form Completion - Done Done

## 2020-01-05 ENCOUNTER — Telehealth: Payer: Self-pay

## 2020-01-05 NOTE — Telephone Encounter (Signed)
Talked to [employer]

## 2020-01-05 NOTE — Telephone Encounter (Signed)
Please call regarding Appt

## 2020-01-07 ENCOUNTER — Other Ambulatory Visit: Payer: Self-pay

## 2020-01-07 ENCOUNTER — Other Ambulatory Visit (HOSPITAL_COMMUNITY): Payer: Self-pay | Admitting: Family Medicine

## 2020-01-07 ENCOUNTER — Encounter: Payer: Self-pay | Admitting: Family Medicine

## 2020-01-07 ENCOUNTER — Ambulatory Visit (INDEPENDENT_AMBULATORY_CARE_PROVIDER_SITE_OTHER): Payer: Medicare HMO | Admitting: Family Medicine

## 2020-01-07 VITALS — BP 144/74 | HR 79 | Resp 16 | Ht 65.0 in | Wt 123.1 lb

## 2020-01-07 DIAGNOSIS — S32010G Wedge compression fracture of first lumbar vertebra, subsequent encounter for fracture with delayed healing: Secondary | ICD-10-CM

## 2020-01-07 DIAGNOSIS — E113513 Type 2 diabetes mellitus with proliferative diabetic retinopathy with macular edema, bilateral: Secondary | ICD-10-CM

## 2020-01-07 DIAGNOSIS — I10 Essential (primary) hypertension: Secondary | ICD-10-CM

## 2020-01-07 DIAGNOSIS — Z09 Encounter for follow-up examination after completed treatment for conditions other than malignant neoplasm: Secondary | ICD-10-CM | POA: Diagnosis not present

## 2020-01-07 DIAGNOSIS — M8580 Other specified disorders of bone density and structure, unspecified site: Secondary | ICD-10-CM

## 2020-01-07 DIAGNOSIS — E1159 Type 2 diabetes mellitus with other circulatory complications: Secondary | ICD-10-CM | POA: Diagnosis not present

## 2020-01-07 DIAGNOSIS — M8000XA Age-related osteoporosis with current pathological fracture, unspecified site, initial encounter for fracture: Secondary | ICD-10-CM | POA: Diagnosis not present

## 2020-01-07 DIAGNOSIS — Z794 Long term (current) use of insulin: Secondary | ICD-10-CM

## 2020-01-07 LAB — POCT GLYCOSYLATED HEMOGLOBIN (HGB A1C): Hemoglobin A1C: 7.7 % — AB (ref 4.0–5.6)

## 2020-01-07 MED ORDER — GLIPIZIDE ER 2.5 MG PO TB24: 2.5000 mg | ORAL_TABLET | Freq: Every day | ORAL | 3 refills | Status: DC

## 2020-01-07 MED ORDER — IRON (FERROUS SULFATE) 325 (65 FE) MG PO TABS: ORAL_TABLET | ORAL | 1 refills | Status: DC

## 2020-01-07 NOTE — Patient Instructions (Addendum)
Annual physical exam , pls change the 12/28 appointment to this, call if you need me sooner   IMPORTANT that you eat breakfast every morning and eat meals regularly  Please give appt for bone density test at checkout  You are referred to Dr Dorris Fetch re diabetes and bone health  Prescription for a cane is sent to your pharmacy  NO DRIVING !!!!

## 2020-01-09 ENCOUNTER — Ambulatory Visit (HOSPITAL_COMMUNITY)
Admission: RE | Admit: 2020-01-09 | Discharge: 2020-01-09 | Disposition: A | Discharge status: Home / Self Care | Payer: Medicare HMO | Source: Ambulatory Visit | Attending: Family Medicine | Admitting: Family Medicine

## 2020-01-09 ENCOUNTER — Encounter: Payer: Self-pay | Admitting: Family Medicine

## 2020-01-09 ENCOUNTER — Other Ambulatory Visit: Payer: Self-pay

## 2020-01-09 DIAGNOSIS — S32010A Wedge compression fracture of first lumbar vertebra, initial encounter for closed fracture: Secondary | ICD-10-CM | POA: Insufficient documentation

## 2020-01-09 DIAGNOSIS — M8000XA Age-related osteoporosis with current pathological fracture, unspecified site, initial encounter for fracture: Secondary | ICD-10-CM | POA: Diagnosis present

## 2020-01-09 DIAGNOSIS — M8008XA Age-related osteoporosis with current pathological fracture, vertebra(e), initial encounter for fracture: Secondary | ICD-10-CM | POA: Diagnosis not present

## 2020-01-09 DIAGNOSIS — Z09 Encounter for follow-up examination after completed treatment for conditions other than malignant neoplasm: Secondary | ICD-10-CM | POA: Insufficient documentation

## 2020-01-09 DIAGNOSIS — Z13820 Encounter for screening for osteoporosis: Secondary | ICD-10-CM | POA: Insufficient documentation
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
## 2020-01-09 NOTE — Assessment & Plan Note (Signed)
Not at goal, no med change at this tinme, systolic mildly elevated
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
## 2020-01-09 NOTE — Assessment & Plan Note (Signed)
Patient in for follow up of recent hospitalization. Discharge summary, and laboratory and radiology data are reviewed, and any questions or concerns  are discussed. Specific issues requiring follow up are specifically addressed.  

## 2020-01-27 ENCOUNTER — Other Ambulatory Visit: Payer: Self-pay | Admitting: Family Medicine

## 2020-01-28 ENCOUNTER — Ambulatory Visit (INDEPENDENT_AMBULATORY_CARE_PROVIDER_SITE_OTHER): Payer: Medicare HMO | Admitting: "Endocrinology

## 2020-01-28 ENCOUNTER — Other Ambulatory Visit: Payer: Self-pay

## 2020-01-28 ENCOUNTER — Encounter: Payer: Self-pay | Admitting: "Endocrinology

## 2020-01-28 VITALS — BP 140/72 | HR 76 | Ht 65.0 in | Wt 122.2 lb

## 2020-01-28 DIAGNOSIS — E782 Mixed hyperlipidemia: Secondary | ICD-10-CM | POA: Diagnosis not present

## 2020-01-28 DIAGNOSIS — M8580 Other specified disorders of bone density and structure, unspecified site: Secondary | ICD-10-CM

## 2020-01-28 DIAGNOSIS — I10 Essential (primary) hypertension: Secondary | ICD-10-CM | POA: Diagnosis not present

## 2020-01-28 DIAGNOSIS — E1165 Type 2 diabetes mellitus with hyperglycemia: Secondary | ICD-10-CM | POA: Diagnosis not present

## 2020-01-28 MED ORDER — LINAGLIPTIN 5 MG PO TABS
5.0000 mg | ORAL_TABLET | Freq: Every day | ORAL | 1 refills | Status: DC
Start: 2020-01-28 — End: 2020-12-14

## 2020-01-28 NOTE — Progress Notes (Signed)
Endocrinology Consult Note       01/28/2020, 3:10 PM   Subjective:    Patient ID: Jacob Barnes, male    DOB: 12-Aug-1940.  Jacob Barnes is being seen in consultation for management of currently uncontrolled symptomatic diabetes requested by  Fayrene Helper, MD.   Past Medical History:  Diagnosis Date  . Abrasion of right middle finger with infection    for 1 week,   . Anemia   . Arthritis    hands  . Chronic kidney disease   . Diabetes mellitus    says since 1979 type 2  . GERD (gastroesophageal reflux disease)   . Glaucoma    both eyes  . History of kidney stones   . Hyperlipemia   . Hypertension   . Rash    front abdomen no drainage    Past Surgical History:  Procedure Laterality Date  . AMPUTATION Right 09/01/2016   Procedure: AMPUTATION RIGHT MIDDLE FINGER TIP;  Surgeon: Mcarthur Rossetti, MD;  Location: [facility];  Service: Orthopedics;  Laterality: Right;  . AMPUTATION Right 10/10/2016   Procedure: REPEAT IRRIGATION AND DEBRIDEMENT RIGHT MIDDLE FINGER WITH AMPUTATION THROUGH PROXIMAL PHALANX;  Surgeon: Mcarthur Rossetti, MD;  Location: [facility];  Service: Orthopedics;  Laterality: Right;  . APPENDECTOMY    . COLONOSCOPY  09/06/2011   Procedure: COLONOSCOPY;  Surgeon: Rogene Houston, MD;  Location: [facility];  Service: Endoscopy;  Laterality: N/A;  830  . CYSTOSCOPY/RETROGRADE/URETEROSCOPY/STONE EXTRACTION WITH BASKET    . ESOPHAGOGASTRODUODENOSCOPY N/A 01/15/2014   Procedure: ESOPHAGOGASTRODUODENOSCOPY (EGD);  Surgeon: Rogene Houston, MD;  Location: [facility];  Service: Endoscopy;  Laterality: N/A;  200  . ESOPHAGOGASTRODUODENOSCOPY N/A 04/22/2014   Procedure: ESOPHAGOGASTRODUODENOSCOPY (EGD);  Surgeon: Rogene Houston, MD;  Location: [facility];  Service: Endoscopy;  Laterality: N/A;  240  . EYE SURGERY Bilateral yrs ago   ioc for cataract  . MALONEY DILATION N/A  01/15/2014   Procedure: MALONEY DILATION;  Surgeon: Rogene Houston, MD;  Location: [facility];  Service: Endoscopy;  Laterality: N/A;  . MALONEY DILATION  04/22/2014   Procedure: Venia Minks DILATION;  Surgeon: Rogene Houston, MD;  Location: [facility];  Service: Endoscopy;;  . surgery for left eye bleedf  2017    Social History   Socioeconomic History  . Marital status: Married    Spouse name: Not on file  . Number of children: Not on file  . Years of education: Not on file  . Highest education level: Not on file  Occupational History  . Not on file  Tobacco Use  . Smoking status: Never Smoker  . Smokeless tobacco: Never Used  Vaping Use  . Vaping Use: Never used  Substance and Sexual Activity  . Alcohol use: No  . Drug use: No  . Sexual activity: Yes  Other Topics Concern  . Not on file  Social History Narrative  . Not on file   Social Determinants of Health   Financial Resource Strain:   . Difficulty of Paying Living Expenses: Not on file  Food Insecurity:   .  Worried About Charity fundraiser in the Last Year: Not on file  . Ran Out of Food in the Last Year: Not on file  Transportation Needs:   . Lack of Transportation (Medical): Not on file  . Lack of Transportation (Non-Medical): Not on file  Physical Activity:   . Days of Exercise per Week: Not on file  . Minutes of Exercise per Session: Not on file  Stress:   . Feeling of Stress : Not on file  Social Connections:   . Frequency of Communication with Friends and Family: Not on file  . Frequency of Social Gatherings with Friends and Family: Not on file  . Attends Religious Services: Not on file  . Active Member of Clubs or Organizations: Not on file  . Attends Archivist Meetings: Not on file  . Marital Status: Not on file    Family History  Problem Relation Age of Onset  . Diabetes Mother   . Hypertension Mother   . Hyperlipidemia Mother   . Stroke Mother   . Heart failure Mother   .  Diabetes Father   . Heart disease Father   . Hypertension Father   . Hyperlipidemia Father   . Heart attack Father   . Diabetes Brother   . Diabetes Brother   . Diabetes Son     Outpatient Encounter Medications as of 01/28/2020  Medication Sig  . Calcium Carbonate (CALCIUM 600 PO) Take 1 tablet by mouth daily.  . Cholecalciferol (VITAMIN D3 PO) Take 4,000 Units by mouth daily.  . benazepril (LOTENSIN) 10 MG tablet TAKE 1 TABLET BY MOUTH EVERY DAY  . blood glucose meter kit and supplies Dispense based on patient and insurance preference. Use up to three times daily as directed. (dx E11.65).  Marland Kitchen glipiZIDE (GLUCOTROL XL) 2.5 MG 24 hr tablet Take 1 tablet (2.5 mg total) by mouth daily with breakfast.  . glucose blood (ONETOUCH VERIO) test strip USE as instrusted UP TO four times daily  . Iron, Ferrous Sulfate, 325 (65 Fe) MG TABS Take one tablet by mouth once daily  . latanoprost (XALATAN) 0.005 % ophthalmic solution INSTILL ONE DROP IN EACH EYE AT BEDTIME  . linagliptin (TRADJENTA) 5 MG TABS tablet Take 1 tablet (5 mg total) by mouth daily.  . pantoprazole (PROTONIX) 40 MG tablet Take 1 tablet (40 mg total) by mouth daily.  . rosuvastatin (CRESTOR) 5 MG tablet Take one tablet by mouth every Monday , Wednesday and Friday  . timolol (TIMOPTIC) 0.5 % ophthalmic solution INSTILL ONE DROP IN THE RIGHT EYE EVERY MORNING  . [DISCONTINUED] TRADJENTA 5 MG TABS tablet TAKE 1 TABLET BY MOUTH EVERY DAY   No facility-administered encounter medications on file as of 01/28/2020.    ALLERGIES: Allergies  Allergen Reactions  . No Known Allergies     VACCINATION STATUS: Immunization History  Administered Date(s) Administered  . Fluad Quad(high Dose 65+) 02/27/2019  . Influenza,inj,Quad PF,6+ Mos 03/17/2013, 03/17/2014, 03/18/2015, 01/04/2016, 12/05/2016, 12/02/2019  . Moderna SARS-COVID-2 Vaccination 06/23/2019, 07/21/2019  . PPD Test 08/16/2010, 08/18/2010, 11/03/2013  . Pneumococcal  Conjugate-13 11/03/2013  . Pneumococcal Polysaccharide-23 12/21/2008  . Td 12/21/2008    Diabetes He presents for his initial diabetic visit. He has type 2 diabetes mellitus. Onset time: He was diagnosed at approximate age of 79 years. His disease course has been fluctuating. There are no hypoglycemic associated symptoms. Pertinent negatives for hypoglycemia include no confusion, headaches, pallor or seizures. There are no diabetic associated symptoms. Pertinent negatives  for diabetes include no chest pain, no fatigue, no polydipsia, no polyphagia, no polyuria and no weakness. There are no hypoglycemic complications. Symptoms are stable. Diabetic complications include nephropathy, peripheral neuropathy and PVD. Risk factors for coronary artery disease include diabetes mellitus, dyslipidemia and hypertension. Current diabetic treatments: Is currently on clear, Tradjenta 5 mg p.o. daily.  Was previously treated with insulin.  After losing significant amount of weight, he was taken off of insulin treatment. His weight is fluctuating minimally. He is following a generally unhealthy diet. When asked about meal planning, he reported none. He rarely participates in exercise. His home blood glucose trend is fluctuating minimally. (He did not bring any meter nor logs to review.  He reports that his glycemic profile range from 90-212 .  No hypoglycemia was reported.  His recent labs show A1c of 7.7% improving from 9.4%.) An ACE inhibitor/angiotensin II receptor blocker is being taken. Eye exam is current.  Hyperlipidemia This is a chronic problem. The current episode started more than 1 year ago. The problem is controlled. Exacerbating diseases include chronic renal disease and diabetes. Pertinent negatives include no chest pain, myalgias or shortness of breath. Current antihyperlipidemic treatment includes statins. Risk factors for coronary artery disease include dyslipidemia, diabetes mellitus, hypertension, male  sex, a sedentary lifestyle and family history.  Hypertension This is a chronic problem. The current episode started more than 1 year ago. The problem is controlled. Pertinent negatives include no chest pain, headaches, neck pain, palpitations or shortness of breath. Past treatments include ACE inhibitors. Hypertensive end-organ damage includes PVD. Identifiable causes of hypertension include chronic renal disease.     Review of Systems  Constitutional: Negative for chills, fatigue, fever and unexpected weight change.  HENT: Negative for dental problem, mouth sores and trouble swallowing.   Eyes: Negative for visual disturbance.  Respiratory: Negative for cough, choking, chest tightness, shortness of breath and wheezing.   Cardiovascular: Negative for chest pain, palpitations and leg swelling.  Gastrointestinal: Negative for abdominal distention, abdominal pain, constipation, diarrhea, nausea and vomiting.  Endocrine: Negative for polydipsia, polyphagia and polyuria.  Genitourinary: Negative for dysuria, flank pain, hematuria and urgency.  Musculoskeletal: Negative for back pain, gait problem, myalgias and neck pain.  Skin: Negative for pallor, rash and wound.  Neurological: Negative for seizures, syncope, weakness, numbness and headaches.  Psychiatric/Behavioral: Negative for confusion and dysphoric mood.    Objective:    Vitals with BMI 01/28/2020 01/07/2020 12/02/2019  Height '5\' 5"'  '5\' 5"'  '5\' 5"'   Weight 122 lbs 3 oz 123 lbs 1 oz 127 lbs  BMI 20.34 37.10 62.69  Systolic 485 462 703  Diastolic 72 74 80  Pulse 76 79 66    BP 140/72   Pulse 76   Ht '5\' 5"'  (1.651 m)   Wt 122 lb 3.2 oz (55.4 kg)   BMI 20.34 kg/m   Wt Readings from Last 3 Encounters:  01/28/20 122 lb 3.2 oz (55.4 kg)  01/07/20 123 lb 1.3 oz (55.8 kg)  12/02/19 127 lb (57.6 kg)     Physical Exam Constitutional:      General: He is not in acute distress.    Appearance: He is well-developed.  HENT:     Head:  Normocephalic and atraumatic.  Neck:     Thyroid: No thyromegaly.     Trachea: No tracheal deviation.  Cardiovascular:     Rate and Rhythm: Normal rate.     Pulses:          Dorsalis pedis pulses  are 1+ on the right side and 1+ on the left side.       Posterior tibial pulses are 1+ on the right side and 1+ on the left side.     Heart sounds: Normal heart sounds, S1 normal and S2 normal. No murmur heard.  No gallop.   Pulmonary:     Effort: Pulmonary effort is normal. No respiratory distress.     Breath sounds: No wheezing.  Abdominal:     General: There is no distension.     Tenderness: There is no abdominal tenderness. There is no guarding.  Musculoskeletal:     Right shoulder: No swelling or deformity.     Cervical back: Normal range of motion and neck supple.     Comments: Patient walks with a cane due to disequilibrium, deconditioning.,  Skin:    General: Skin is warm and dry.     Findings: No rash.     Nails: There is no clubbing.  Neurological:     Mental Status: He is alert and oriented to person, place, and time.     Cranial Nerves: No cranial nerve deficit.     Sensory: No sensory deficit.     Gait: Gait normal.     Deep Tendon Reflexes: Reflexes are normal and symmetric.  Psychiatric:        Speech: Speech normal.        Behavior: Behavior normal. Behavior is cooperative.        Thought Content: Thought content normal.        Judgment: Judgment normal.      CMP ( most recent) CMP     Component Value Date/Time   NA 140 09/30/2019 1008   K 4.6 09/30/2019 1008   CL 104 09/30/2019 1008   CO2 30 09/30/2019 1008   GLUCOSE 101 (H) 09/30/2019 1008   BUN 22 09/30/2019 1008   CREATININE 1.36 (H) 09/30/2019 1008   CALCIUM 9.2 09/30/2019 1008   PROT 8.4 (H) 09/30/2019 1008   ALBUMIN 3.6 01/04/2016 1419   AST 21 09/30/2019 1008   ALT 13 09/30/2019 1008   ALKPHOS 69 01/04/2016 1419   BILITOT 0.5 09/30/2019 1008   GFRNONAA 49 (L) 09/30/2019 1008   GFRAA 57 (L)  09/30/2019 1008     Diabetic Labs (most recent): Lab Results  Component Value Date   HGBA1C 7.7 (A) 01/07/2020   HGBA1C 9.4 (H) 09/30/2019   HGBA1C 8.6 (H) 06/24/2019     Lipid Panel ( most recent) Lipid Panel     Component Value Date/Time   CHOL 114 09/30/2019 1008   TRIG 42 09/30/2019 1008   HDL 44 09/30/2019 1008   CHOLHDL 2.6 09/30/2019 1008   VLDL 7 11/27/2016 1000   LDLCALC 58 09/30/2019 1008      Lab Results  Component Value Date   TSH 1.77 09/23/2018   TSH 0.61 07/19/2015   TSH 0.952 03/18/2015   TSH 0.921 12/11/2013   TSH 1.392 10/29/2012   TSH 0.615 09/26/2011   TSH 1.573 12/21/2008      Assessment & Plan:   1. Uncontrolled type 2 diabetes mellitus with hyperglycemia (HCC)   - Jacob Barnes has currently uncontrolled symptomatic type 2 DM since  79 years of age,  with most recent A1c of 7.7 %. Recent labs reviewed. - I had a long discussion with him about the progressive nature of diabetes and the pathology behind its complications. -his diabetes is complicated by peripheral arterial disease and he remains  at a high risk for more acute and chronic complications which include CAD, CVA, CKD, retinopathy, and neuropathy. These are all discussed in detail with him.  - I have counseled him on diet  and weight management  by adopting a carbohydrate restricted/protein rich diet. Patient is encouraged to switch to  unprocessed or minimally processed     complex starch and increased protein intake (animal or plant source), fruits, and vegetables. -  he is advised to stick to a routine mealtimes to eat 3 meals  a day and avoid unnecessary snacks ( to snack only to correct hypoglycemia).   - he admits that there is a room for improvement in his food and drink choices. - Suggestion is made for him to avoid simple carbohydrates  from his diet including Cakes, Sweet Desserts, Ice Cream, Soda (diet and regular), Sweet Tea, Candies, Chips, Cookies, Store Bought Juices,  Alcohol in Excess of  1-2 drinks a day, Artificial Sweeteners,  Coffee Creamer, and "Sugar-free" Products. This will help patient to have more stable blood glucose profile and potentially avoid unintended weight gain.  - he will be scheduled with Jearld Fenton, RDN, CDE for diabetes education.  - I have approached him with the following individualized plan to manage  his diabetes and patient agrees:   -In light of his advanced age, relatively stable A1c of 7.7%, he will not need additional treatment with insulin at this time. -He is approached and agrees to continue to monitor blood glucose twice a day-daily before breakfast and at bedtime. - he is encouraged to call clinic for blood glucose levels less than 70 or above 300 mg /dl. -He will continue on glipizide 2.5 mg XL p.o. daily at breakfast as well as Tradjenta 5 mg  P.o. daily at breakfast.    - he is not a candidate for Metformin due to concurrent renal insufficiency.  - he is not a candidate for incretin therapy due to his body habitus.   - Specific targets for  A1c;  LDL, HDL,  and Triglycerides were discussed with the patient.  2) Blood Pressure /Hypertension:  his blood pressure is  controlled to target.   he is advised to continue his current medications including benazepril 10 mg p.o. daily with breakfast . 3) Lipids/Hyperlipidemia:   Review of his recent lipid panel showed  controlled  LDL at 58 .  he  is advised to continue    Crestor 5 mg daily at bedtime.  Side effects and precautions discussed with him.  4)  Weight/Diet:  Body mass index is 20.34 kg/m.   he is not a candidate for weight loss.   Exercise, and detailed carbohydrates information provided  -  detailed on discharge instructions.   5) osteopenia  -He will not need bisphosphonate intervention at this time.  He is advised to continue on his vitamin D 4000 units daily, as well as calcium carbonate 600 mg p.o. daily. 6) Chronic Care/Health Maintenance:  -he  is  on ACEI/ARB and Statin medications and  is encouraged to initiate and continue to follow up with Ophthalmology, Dentist,  Podiatrist at least yearly or according to recommendations, and advised to   stay away from smoking. I have recommended yearly flu vaccine and pneumonia vaccine at least every 5 years; moderate intensity exercise for up to 150 minutes weekly; and  sleep for at least 7 hours a day.  - he is  advised to maintain close follow up with Fayrene Helper, MD for primary  care needs, as well as his other providers for optimal and coordinated care.   - Time spent in this patient care: 60 min, of which > 50% was spent in  counseling  him about his controlled type 2 diabetes, hyperlipidemia, hypertension and the rest reviewing his blood glucose logs , discussing his hypoglycemia and hyperglycemia episodes, reviewing his current and  previous labs / studies  ( including abstraction from other facilities) and medications  doses and developing a  long term treatment plan based on the latest standards of care/ guidelines; and documenting his care.    Please refer to Patient Instructions for Blood Glucose Monitoring and Insulin/Medications Dosing Guide"  in media tab for additional information. Please  also refer to " Patient Self Inventory" in the Media  tab for reviewed elements of pertinent patient history.  Rhett Bannister participated in the discussions, expressed understanding, and voiced agreement with the above plans.  All questions were answered to his satisfaction. he is encouraged to contact clinic should he have any questions or concerns prior to his return visit.   Follow up plan: - Return in about 9 weeks (around 03/31/2020) for ABI in Office NV, A1c -NV, Urine MA - NV.  Glade Lloyd, MD [facility] [address] [place], [place] [address] Phone: [phone]  Fax: [phone]    01/28/2020, 3:10 PM  This note was partially  dictated with voice recognition software. Similar sounding words can be transcribed inadequately or may not  be corrected upon review.

## 2020-01-28 NOTE — Patient Instructions (Signed)

## 2020-02-11 ENCOUNTER — Other Ambulatory Visit: Payer: Self-pay

## 2020-02-11 ENCOUNTER — Telehealth (INDEPENDENT_AMBULATORY_CARE_PROVIDER_SITE_OTHER): Payer: Medicare HMO | Admitting: Family Medicine

## 2020-02-11 ENCOUNTER — Encounter: Payer: Self-pay | Admitting: Family Medicine

## 2020-02-11 VITALS — Ht 65.0 in | Wt 124.0 lb

## 2020-02-11 DIAGNOSIS — Z0000 Encounter for general adult medical examination without abnormal findings: Secondary | ICD-10-CM

## 2020-02-11 NOTE — Progress Notes (Signed)
Subjective:   Jacob Barnes is a 79 y.o. male who presents for Medicare Annual/Subsequent preventive examination.  Method of visit: Telephone Location of Patient: Home Location of Provider: Office Consent was obtain for visit over the telephone. Services rendered by provider: Visit was performed via telephone I verified that I am speaking with the correct person using two identifiers.    Review of Systems         Objective:    Today's Vitals   02/11/20 1136 02/11/20 1137  Weight: 124 lb (56.2 kg)   Height: _0  (1.651 m)   PainSc: 4  4   PainLoc: Back    Body mass index is 20.63 kg/m.  Advanced Directives 02/11/2020 02/10/2019 01/21/2018 10/10/2016 09/01/2016 08/30/2016 01/30/2014  Does Patient Have a Medical Advance Directive? _1  No No  Would patient like information on creating a medical advance directive? No - Patient declined - Yes (ED - Information included in AVS) No - Patient declined No - Patient declined No - Patient declined No - patient declined information  Pre-existing out of facility DNR order (yellow form or pink MOST form) - - - - - - -    Current Medications (verified) Outpatient Encounter Medications as of 02/11/2020  Medication Sig  . benazepril (LOTENSIN) 10 MG tablet TAKE 1 TABLET BY MOUTH EVERY DAY  . blood glucose meter kit and supplies Dispense based on patient and insurance preference. Use up to three times daily as directed. (dx E11.65).  . Calcium Carbonate (CALCIUM 600 PO) Take 1 tablet by mouth daily.  . Cholecalciferol (VITAMIN D3 PO) Take 4,000 Units by mouth daily.  Marland Kitchen glipiZIDE (GLUCOTROL XL) 2.5 MG 24 hr tablet Take 1 tablet (2.5 mg total) by mouth daily with breakfast.  . glucose blood (ONETOUCH VERIO) test strip USE as instrusted UP TO four times daily  . Iron, Ferrous Sulfate, 325 (65 Fe) MG TABS Take one tablet by mouth once daily  . latanoprost (XALATAN) 0.005 % ophthalmic solution INSTILL ONE DROP IN EACH EYE AT BEDTIME  .  linagliptin (TRADJENTA) 5 MG TABS tablet Take 1 tablet (5 mg total) by mouth daily.  . pantoprazole (PROTONIX) 40 MG tablet Take 1 tablet (40 mg total) by mouth daily.  . rosuvastatin (CRESTOR) 5 MG tablet Take one tablet by mouth every Monday , Wednesday and Friday  . timolol (TIMOPTIC) 0.5 % ophthalmic solution INSTILL ONE DROP IN THE RIGHT EYE EVERY MORNING   No facility-administered encounter medications on file as of 02/11/2020.    Allergies (verified) No known allergies   History: Past Medical History:  Diagnosis Date  . Abrasion of right middle finger with infection    for 1 week,   . Anemia   . Arthritis    hands  . Chronic kidney disease   . Diabetes mellitus    says since 1979 type 2  . GERD (gastroesophageal reflux disease)   . Glaucoma    both eyes  . History of kidney stones   . Hyperlipemia   . Hypertension   . Rash    front abdomen no drainage   Past Surgical History:  Procedure Laterality Date  . AMPUTATION Right 09/01/2016   Procedure: AMPUTATION RIGHT MIDDLE FINGER TIP;  Surgeon: Mcarthur Rossetti, MD;  Location: [facility];  Service: Orthopedics;  Laterality: Right;  . AMPUTATION Right 10/10/2016   Procedure: REPEAT IRRIGATION AND DEBRIDEMENT RIGHT MIDDLE FINGER WITH AMPUTATION THROUGH PROXIMAL PHALANX;  Surgeon: Mcarthur Rossetti,  MD;  Location: [facility];  Service: Orthopedics;  Laterality: Right;  . APPENDECTOMY    . COLONOSCOPY  09/06/2011   Procedure: COLONOSCOPY;  Surgeon: Rogene Houston, MD;  Location: [facility];  Service: Endoscopy;  Laterality: N/A;  830  . CYSTOSCOPY/RETROGRADE/URETEROSCOPY/STONE EXTRACTION WITH BASKET    . ESOPHAGOGASTRODUODENOSCOPY N/A 01/15/2014   Procedure: ESOPHAGOGASTRODUODENOSCOPY (EGD);  Surgeon: Rogene Houston, MD;  Location: [facility];  Service: Endoscopy;  Laterality: N/A;  200  . ESOPHAGOGASTRODUODENOSCOPY N/A 04/22/2014   Procedure: ESOPHAGOGASTRODUODENOSCOPY (EGD);  Surgeon: Rogene Houston, MD;  Location:  [facility];  Service: Endoscopy;  Laterality: N/A;  240  . EYE SURGERY Bilateral yrs ago   ioc for cataract  . MALONEY DILATION N/A 01/15/2014   Procedure: MALONEY DILATION;  Surgeon: Rogene Houston, MD;  Location: [facility];  Service: Endoscopy;  Laterality: N/A;  . MALONEY DILATION  04/22/2014   Procedure: Venia Minks DILATION;  Surgeon: Rogene Houston, MD;  Location: [facility];  Service: Endoscopy;;  . surgery for left eye bleedf  2017   Family History  Problem Relation Age of Onset  . Diabetes Mother   . Hypertension Mother   . Hyperlipidemia Mother   . Stroke Mother   . Heart failure Mother   . Diabetes Father   . Heart disease Father   . Hypertension Father   . Hyperlipidemia Father   . Heart attack Father   . Diabetes Brother   . Diabetes Brother   . Diabetes Son    Social History   Socioeconomic History  . Marital status: Married    Spouse name: Not on file  . Number of children: Not on file  . Years of education: Not on file  . Highest education level: Not on file  Occupational History  . Not on file  Tobacco Use  . Smoking status: Never Smoker  . Smokeless tobacco: Never Used  Vaping Use  . Vaping Use: Never used  Substance and Sexual Activity  . Alcohol use: No  . Drug use: No  . Sexual activity: Yes  Other Topics Concern  . Not on file  Social History Narrative  . Not on file   Social Determinants of Health   Financial Resource Strain: Low Risk   . Difficulty of Paying Living Expenses: Not hard at all  Food Insecurity: No Food Insecurity  . Worried About Charity fundraiser in the Last Year: Never true  . Ran Out of Food in the Last Year: Never true  Transportation Needs: No Transportation Needs  . Lack of Transportation (Medical): No  . Lack of Transportation (Non-Medical): No  Physical Activity: Insufficiently Active  . Days of Exercise per Week: 7 days  . Minutes of Exercise per Session: 20 min  Stress: No Stress Concern Present  .  Feeling of Stress : Not at all  Social Connections: Moderately Integrated  . Frequency of Communication with Friends and Family: More than three times a week  . Frequency of Social Gatherings with Friends and Family: Three times a week  . Attends Religious Services: More than 4 times per year  . Active Member of Clubs or Organizations: No  . Attends Archivist Meetings: Never  . Marital Status: Married    Tobacco Counseling Counseling given: Yes   Clinical Intake:  Pre-visit preparation completed: Yes  Pain : 0-10 Pain Score: 4  Pain Type: Acute pain Pain Location: Back Pain Orientation: Lower Pain Descriptors / Indicators:  Sore, Discomfort Pain Onset: More than a month ago Pain Frequency: Intermittent Pain Relieving Factors: pain medication, rest Effect of Pain on Daily Activities: mild  Pain Relieving Factors: pain medication, rest  BMI - recorded: 20.34 Nutritional Status: BMI of 19-24  Normal Nutritional Risks: None Diabetes: Yes CBG done?: No Did pt. bring in CBG monitor from home?: No  How often do you need to have someone help you when you read instructions, pamphlets, or other written materials from your doctor or pharmacy?: 1 - Never What is the last grade level you completed in school?: 12  Diabetic? yes  Interpreter Needed?: No  Information entered by :: Laretta Bolster, LPN   Activities of Daily Living In your present state of health, do you have any difficulty performing the following activities: 02/11/2020  Hearing? N  Vision? N  Difficulty concentrating or making decisions? N  Walking or climbing stairs? N  Doing errands, shopping? N  Preparing Food and eating ? N  Using the Toilet? N  In the past six months, have you accidently leaked urine? N  Do you have problems with loss of bowel control? N  Managing your Medications? N  Managing your Finances? N  Housekeeping or managing your Housekeeping? N  Some recent data might be hidden     Patient Care Team: Fayrene Helper, MD as PCP - General Zigmund Daniel Chrystie Nose, MD as Consulting Physician (Ophthalmology)  Indicate any recent Medical Services you may have received from other than [facility] providers in the past year (date may be approximate).     Assessment:   This is a routine wellness examination for [place].  Hearing/Vision screen No exam data present  Dietary issues and exercise activities discussed: Current Exercise Habits: Home exercise routine, Type of exercise: walking, Time (Minutes): 20, Frequency (Times/Week): 7, Weekly Exercise (Minutes/Week): 140, Intensity: Mild, Exercise limited by: None identified  Goals    . Increase physical activity     Pt would like to get physically able to go on a trip.      Depression Screen PHQ 2/9 Scores 02/11/2020 02/11/2020 01/07/2020 02/10/2019 10/15/2018 09/19/2018 06/20/2018  PHQ - 2 Score 0 0 0 0 0 0 0  PHQ- 9 Score - - - - - - -    Fall Risk Fall Risk  02/11/2020 01/07/2020 12/02/2019 10/02/2019 07/01/2019  Falls in the past year? 0 0 0 0 0  Number falls in past yr: 0 - 0 0 0  Injury with Fall? 0 - 0 0 0  Risk for fall due to : No Fall Risks - - - -  Follow up Falls evaluation completed - - - -    Any stairs in or around the home? no If so, are there any without handrails? n/a Home free of loose throw rugs in walkways, pet beds, electrical cords, etc? yes Adequate lighting in your home to reduce risk of falls? yes  ASSISTIVE DEVICES UTILIZED TO PREVENT FALLS:  Life alert? Yes  Use of a cane, walker or w/c? cane Grab bars in the bathroom? yes Shower chair or bench in shower? yes Elevated toilet seat or a handicapped toilet? no  TIMED UP AND GO:  Was the test performed? no  Length of time to ambulate n/a   Cognitive Function: MMSE - Mini Mental State Exam 10/15/2018  Orientation to time 5  Orientation to Place 5  Registration 3  Attention/ Calculation 5  Recall 3  Language- name 2 objects 2  Language- repeat  1  Language- follow 3 step command 3  Language- read & follow direction 1  Write a sentence 1  Copy design 0  Total score 29     6CIT Screen 02/11/2020 02/10/2019 01/21/2018  What Year? 0 points 0 points 0 points  What month? 0 points 0 points 0 points  What time? 0 points 0 points 0 points  Count back from 20 4 points 0 points 0 points  Months in reverse 4 points 0 points 0 points  Repeat phrase 2 points 0 points 4 points  Total Score 10 0 4    Immunizations Immunization History  Administered Date(s) Administered  . Fluad Quad(high Dose 65+) 02/27/2019  . Influenza,inj,Quad PF,6+ Mos 03/17/2013, 03/17/2014, 03/18/2015, 01/04/2016, 12/05/2016, 12/02/2019  . Moderna SARS-COVID-2 Vaccination 06/23/2019, 07/21/2019  . PPD Test 08/16/2010, 08/18/2010, 11/03/2013  . Pneumococcal Conjugate-13 11/03/2013  . Pneumococcal Polysaccharide-23 12/21/2008  . Td 12/21/2008    Tdap: Due Flu: 12/02/22 Pneumococcal: Completed Covid: Completed  Qualifies for Shingles Vaccine? yes Zostavax completed: no Shringrix: no  Screening Tests Health Maintenance  Topic Date Due  . TETANUS/TDAP  02/27/2020 (Originally 12/22/2018)  . FOOT EXAM  02/27/2020  . HEMOGLOBIN A1C  07/06/2020  . OPHTHALMOLOGY EXAM  01/06/2021  . INFLUENZA VACCINE  Completed  . COVID-19 Vaccine  Completed  . PNA vac Low Risk Adult  Completed    Health Maintenance  There are no preventive care reminders to display for this patient.  Colorectal Screening: No longer needed.  Lung Cancer Screening: (Low Dose CT Chest recommended if Age 30-80 years, 30 pack-year currently smoking OR have quit w/in 15years.) does not qualify.   Lung Cancer Screening Referral: n/a  Additional Screening:  Hepatitis C Screening: does not qualify  Vision Screening: Recommended annual ophthalmology exams for early detection of glaucoma and other disorders of the eye. Is the patient up to date with their annual eye exam?  yes Who is the  provider or what is the name of the office in which the patient attends annual eye exams? Dr. Albertina Parr in [place], [place] If pt is not established with a provider, would they like to be referred to a provider to establish care? n/a  Dental Screening: Recommended annual dental exams for proper oral hygiene  Community Resource Referral / Chronic Care Management: CRR required this visit? no  CCM required this visit?  no     Plan:      1. Encounter for Medicare annual wellness exam   I have personally reviewed and noted the following in the patient's chart:   . Medical and social history . Use of alcohol, tobacco or illicit drugs  . Current medications and supplements . Functional ability and status . Nutritional status . Physical activity . Advanced directives . List of other physicians . Hospitalizations, surgeries, and ER visits in previous 12 months . Vitals . Screenings to include cognitive, depression, and falls . Referrals and appointments  In addition, I have reviewed and discussed with patient certain preventive protocols, quality metrics, and best practice recommendations. A written personalized care plan for preventive services as well as general preventive health recommendations were provided to patient.     Perlie Mayo, NP   02/11/2020   Nurse Notes: AWV conducted out of the office over the phone with patient consent to do televisit via audio. Patient at home. Provider out of office. Visit took 30 minutes to complete.

## 2020-02-11 NOTE — Patient Instructions (Signed)
[name] , Thank you for taking time to come for your Medicare Wellness Visit. I appreciate your ongoing commitment to your health goals. Please review the following plan we discussed and let me know if I can assist you in the future.   Screening recommendations/referrals: Colonoscopy: No longer needed. Recommended yearly ophthalmology/optometry visit for glaucoma screening and checkup Recommended yearly dental visit for hygiene and checkup  Vaccinations: Influenza vaccine: Fall 2022 Pneumococcal vaccine: Completed Tdap vaccine: Due Shingles vaccine: Declined  Advanced directives: No  Conditions/risks identified: None  Next appointment: 04/06/20 @ 1:20 pm.  Preventive Care 65 Years and Older, Male Preventive care refers to lifestyle choices and visits with your health care provider that can promote health and wellness. What does preventive care include?  A yearly physical exam. This is also called an annual well check.  Dental exams once or twice a year.  Routine eye exams. Ask your health care provider how often you should have your eyes checked.  Personal lifestyle choices, including:  Daily care of your teeth and gums.  Regular physical activity.  Eating a healthy diet.  Avoiding tobacco and drug use.  Limiting alcohol use.  Practicing safe sex.  Taking low doses of aspirin every day.  Taking vitamin and mineral supplements as recommended by your health care provider. What happens during an annual well check? The services and screenings done by your health care provider during your annual well check will depend on your age, overall health, lifestyle risk factors, and family history of disease. Counseling  Your health care provider may ask you questions about your:  Alcohol use.  Tobacco use.  Drug use.  Emotional well-being.  Home and relationship well-being.  Sexual activity.  Eating habits.  History of falls.  Memory and ability to understand  (cognition).  Work and work Statistician. Screening  You may have the following tests or measurements:  Height, weight, and BMI.  Blood pressure.  Lipid and cholesterol levels. These may be checked every 5 years, or more frequently if you are over 63 years old.  Skin check.  Lung cancer screening. You may have this screening every year starting at age 21 if you have a 30-pack-year history of smoking and currently smoke or have quit within the past 15 years.  Fecal occult blood test (FOBT) of the stool. You may have this test every year starting at age 74.  Flexible sigmoidoscopy or colonoscopy. You may have a sigmoidoscopy every 5 years or a colonoscopy every 10 years starting at age 8.  Prostate cancer screening. Recommendations will vary depending on your family history and other risks.  Hepatitis C blood test.  Hepatitis B blood test.  Sexually transmitted disease (STD) testing.  Diabetes screening. This is done by checking your blood sugar (glucose) after you have not eaten for a while (fasting). You may have this done every 1-3 years.  Abdominal aortic aneurysm (AAA) screening. You may need this if you are a current or former smoker.  Osteoporosis. You may be screened starting at age 57 if you are at high risk. Talk with your health care provider about your test results, treatment options, and if necessary, the need for more tests. Vaccines  Your health care provider may recommend certain vaccines, such as:  Influenza vaccine. This is recommended every year.  Tetanus, diphtheria, and acellular pertussis (Tdap, Td) vaccine. You may need a Td booster every 10 years.  Zoster vaccine. You may need this after age 31.  Pneumococcal 13-valent conjugate (  PCV13) vaccine. One dose is recommended after age 84.  Pneumococcal polysaccharide (PPSV23) vaccine. One dose is recommended after age 25. Talk to your health care provider about which screenings and vaccines you need and  how often you need them. This information is not intended to replace advice given to you by your health care provider. Make sure you discuss any questions you have with your health care provider. Document Released: 04/23/2015 Document Revised: 12/15/2015 Document Reviewed: 01/26/2015 Elsevier Interactive Patient Education  2017 [place] Prevention in the Home Falls can cause injuries. They can happen to people of all ages. There are many things you can do to make your home safe and to help prevent falls. What can I do on the outside of my home?  Regularly fix the edges of walkways and driveways and fix any cracks.  Remove anything that might make you trip as you walk through a door, such as a raised step or threshold.  Trim any bushes or trees on the path to your home.  Use bright outdoor lighting.  Clear any walking paths of anything that might make someone trip, such as rocks or tools.  Regularly check to see if handrails are loose or broken. Make sure that both sides of any steps have handrails.  Any raised decks and porches should have guardrails on the edges.  Have any leaves, snow, or ice cleared regularly.  Use sand or salt on walking paths during winter.  Clean up any spills in your garage right away. This includes oil or grease spills. What can I do in the bathroom?  Use night lights.  Install grab bars by the toilet and in the tub and shower. Do not use towel bars as grab bars.  Use non-skid mats or decals in the tub or shower.  If you need to sit down in the shower, use a plastic, non-slip stool.  Keep the floor dry. Clean up any water that spills on the floor as soon as it happens.  Remove soap buildup in the tub or shower regularly.  Attach bath mats securely with double-sided non-slip rug tape.  Do not have throw rugs and other things on the floor that can make you trip. What can I do in the bedroom?  Use night lights.  Make sure that you  have a light by your bed that is easy to reach.  Do not use any sheets or blankets that are too big for your bed. They should not hang down onto the floor.  Have a firm chair that has side arms. You can use this for support while you get dressed.  Do not have throw rugs and other things on the floor that can make you trip. What can I do in the kitchen?  Clean up any spills right away.  Avoid walking on wet floors.  Keep items that you use a lot in easy-to-reach places.  If you need to reach something above you, use a strong step stool that has a grab bar.  Keep electrical cords out of the way.  Do not use floor polish or wax that makes floors slippery. If you must use wax, use non-skid floor wax.  Do not have throw rugs and other things on the floor that can make you trip. What can I do with my stairs?  Do not leave any items on the stairs.  Make sure that there are handrails on both sides of the stairs and use them. Fix handrails that  are broken or loose. Make sure that handrails are as long as the stairways.  Check any carpeting to make sure that it is firmly attached to the stairs. Fix any carpet that is loose or worn.  Avoid having throw rugs at the top or bottom of the stairs. If you do have throw rugs, attach them to the floor with carpet tape.  Make sure that you have a light switch at the top of the stairs and the bottom of the stairs. If you do not have them, ask someone to add them for you. What else can I do to help prevent falls?  Wear shoes that:  Do not have high heels.  Have rubber bottoms.  Are comfortable and fit you well.  Are closed at the toe. Do not wear sandals.  If you use a stepladder:  Make sure that it is fully opened. Do not climb a closed stepladder.  Make sure that both sides of the stepladder are locked into place.  Ask someone to hold it for you, if possible.  Clearly mark and make sure that you can see:  Any grab bars or  handrails.  First and last steps.  Where the edge of each step is.  Use tools that help you move around (mobility aids) if they are needed. These include:  Canes.  Walkers.  Scooters.  Crutches.  Turn on the lights when you go into a dark area. Replace any light bulbs as soon as they burn out.  Set up your furniture so you have a clear path. Avoid moving your furniture around.  If any of your floors are uneven, fix them.  If there are any pets around you, be aware of where they are.  Review your medicines with your doctor. Some medicines can make you feel dizzy. This can increase your chance of falling. Ask your doctor what other things that you can do to help prevent falls. This information is not intended to replace advice given to you by your health care provider. Make sure you discuss any questions you have with your health care provider. Document Released: 01/21/2009 Document Revised: 09/02/2015 Document Reviewed: 05/01/2014 Elsevier Interactive Patient Education  2017 [employer].

## 2020-02-20 ENCOUNTER — Other Ambulatory Visit: Payer: Self-pay | Admitting: Family Medicine

## 2020-03-31 ENCOUNTER — Ambulatory Visit: Payer: Medicare HMO | Admitting: Nurse Practitioner

## 2020-04-01 LAB — COMPREHENSIVE METABOLIC PANEL
ALT: 8 IU/L (ref 0–44)
AST: 16 IU/L (ref 0–40)
Albumin/Globulin Ratio: 1 — ABNORMAL LOW (ref 1.2–2.2)
Albumin: 4.3 g/dL (ref 3.7–4.7)
Alkaline Phosphatase: 109 IU/L (ref 44–121)
BUN/Creatinine Ratio: 9 — ABNORMAL LOW (ref 10–24)
BUN: 12 mg/dL (ref 8–27)
Bilirubin Total: 0.4 mg/dL (ref 0.0–1.2)
CO2: 26 mmol/L (ref 20–29)
Calcium: 9.4 mg/dL (ref 8.6–10.2)
Chloride: 101 mmol/L (ref 96–106)
Creatinine, Ser: 1.31 mg/dL — ABNORMAL HIGH (ref 0.76–1.27)
GFR calc Af Amer: 59 mL/min/{1.73_m2} — ABNORMAL LOW (ref >59)
GFR calc non Af Amer: 51 mL/min/{1.73_m2} — ABNORMAL LOW (ref >59)
Globulin, Total: 4.3 g/dL (ref 1.5–4.5)
Glucose: 187 mg/dL — ABNORMAL HIGH (ref 65–99)
Potassium: 4.5 mmol/L (ref 3.5–5.2)
Sodium: 139 mmol/L (ref 134–144)
Total Protein: 8.6 g/dL — ABNORMAL HIGH (ref 6.0–8.5)

## 2020-04-01 LAB — VITAMIN D 25 HYDROXY (VIT D DEFICIENCY, FRACTURES): Vit D, 25-Hydroxy: 55.3 ng/mL (ref 30.0–100.0)

## 2020-04-01 LAB — TSH: TSH: 1.3 u[IU]/mL (ref 0.450–4.500)

## 2020-04-01 LAB — T4, FREE: Free T4: 1.26 ng/dL (ref 0.82–1.77)

## 2020-04-05 ENCOUNTER — Ambulatory Visit: Payer: Medicare HMO | Admitting: Nurse Practitioner

## 2020-04-06 ENCOUNTER — Ambulatory Visit (INDEPENDENT_AMBULATORY_CARE_PROVIDER_SITE_OTHER): Payer: Medicare HMO | Admitting: Family Medicine

## 2020-04-06 ENCOUNTER — Encounter: Payer: Self-pay | Admitting: Nurse Practitioner

## 2020-04-06 ENCOUNTER — Other Ambulatory Visit: Payer: Self-pay

## 2020-04-06 ENCOUNTER — Ambulatory Visit: Payer: Medicare HMO | Admitting: Nurse Practitioner

## 2020-04-06 ENCOUNTER — Encounter: Payer: Self-pay | Admitting: Family Medicine

## 2020-04-06 VITALS — BP 125/72 | HR 68 | Ht 65.0 in | Wt 119.0 lb

## 2020-04-06 VITALS — BP 130/70 | HR 84 | Temp 98.0°F | Ht 65.0 in | Wt 119.0 lb

## 2020-04-06 DIAGNOSIS — M8580 Other specified disorders of bone density and structure, unspecified site: Secondary | ICD-10-CM

## 2020-04-06 DIAGNOSIS — E1165 Type 2 diabetes mellitus with hyperglycemia: Secondary | ICD-10-CM

## 2020-04-06 DIAGNOSIS — Z0001 Encounter for general adult medical examination with abnormal findings: Secondary | ICD-10-CM | POA: Diagnosis not present

## 2020-04-06 DIAGNOSIS — E113513 Type 2 diabetes mellitus with proliferative diabetic retinopathy with macular edema, bilateral: Secondary | ICD-10-CM | POA: Diagnosis not present

## 2020-04-06 DIAGNOSIS — K219 Gastro-esophageal reflux disease without esophagitis: Secondary | ICD-10-CM

## 2020-04-06 DIAGNOSIS — Z9181 History of falling: Secondary | ICD-10-CM

## 2020-04-06 DIAGNOSIS — I10 Essential (primary) hypertension: Secondary | ICD-10-CM | POA: Diagnosis not present

## 2020-04-06 DIAGNOSIS — E782 Mixed hyperlipidemia: Secondary | ICD-10-CM

## 2020-04-06 DIAGNOSIS — E441 Mild protein-calorie malnutrition: Secondary | ICD-10-CM | POA: Diagnosis not present

## 2020-04-06 LAB — POCT GLYCOSYLATED HEMOGLOBIN (HGB A1C): HbA1c, POC (controlled diabetic range): 10.5 % — AB (ref 0.0–7.0)

## 2020-04-06 NOTE — Progress Notes (Signed)
Health Maintenance reviewed -  Immunization History  Administered Date(s) Administered  . Fluad Quad(high Dose 65+) 02/27/2019  . Influenza,inj,Quad PF,6+ Mos 03/17/2013, 03/17/2014, 03/18/2015, 01/04/2016, 12/05/2016, 12/02/2019  . Moderna Sars-Covid-2 Vaccination 06/23/2019, 07/21/2019  . PPD Test 08/16/2010, 08/18/2010, 11/03/2013  . Pneumococcal Conjugate-13 11/03/2013  . Pneumococcal Polysaccharide-23 12/21/2008  . Td 12/21/2008   Last colonoscopy: No longer needed Last PSA: Last PSA check was November 2020 0.9 Dentist: Sees a dentist as needed. Ophtho: Wears glasses sees an eye doctor as needed.  Most recently the glasses do not work well for him his corrected vision in the office today shows very strong changes.  I advised for him to follow-up with his eye doctor. Exercise: Does not do any form of structured exercise.  He has lost some weight recently.  He has poor mobility and balance issues does walk with a cane.  Has had several falls over the course of the year most recently earlier in the day today. Smoker: No Alcohol Use: None  Other doctors caring for patient include:  Patient Care Team: Fayrene Helper, MD as PCP - General Zigmund Daniel Chrystie Nose, MD as Consulting Physician (Ophthalmology)  End of Life Discussion:  Patient Has paperwork, needs to be filled out a living will and medical power of attorney  Subjective:   HPI  Jacob Barnes is a 79 y.o. male who presents for annual visit and follow-up on chronic medical conditions.  He has the following concerns: Big concerns today are balance issues and weight loss.  Presents today with family as they are concerned that he continues to lose weight.  He now weighs 119 pounds BMI is 19.80; previous weight in November of this year with 124 pounds He does not have much room to lose more.  He reports that his appetite is just changed she just does not feel like he is hungry anymore.  He does not do a lot of movement secondary  to his fall risk.  So he does not think that he works up in appetite as much as he could or used to.  Not currently doing any supplementation at this time.  Additionally they report that he has had several falls over the years and watching him walk he is very quick with mobility quick to rush to sit down and not watch were he is stepping.  He does have a cane that he uses regularly.  Most of the time per family it is because he is rushing to get somewhere or he steps off to the side and not straight on because of his balance.  Denies having any other issues or concerns today.    Review Of Systems  Review of Systems  Constitutional: Positive for unexpected weight change.  HENT: Negative.   Eyes: Positive for visual disturbance.  Respiratory: Negative.   Cardiovascular: Negative.   Gastrointestinal: Negative.   Endocrine: Negative.   Genitourinary: Negative.   Musculoskeletal: Negative.   Skin: Negative.   Neurological: Positive for weakness.       Falls  Psychiatric/Behavioral: Negative.     Objective:   PHYSICAL EXAM:  BP 130/70 (BP Location: Right Arm, Patient Position: Sitting, Cuff Size: Normal)   Pulse 84   Temp 98 F (36.7 C) (Temporal)   Ht 5\' 5"  (1.651 m)   Wt 119 lb (54 kg)   SpO2 99%   BMI 19.80 kg/m        Physical Exam Vitals and nursing note reviewed.  Constitutional:  Appearance: Normal appearance. He is well-developed, well-groomed and normal weight.     Comments: Thin - clothing is a little big on him   HENT:     Head: Normocephalic and atraumatic.     Right Ear: Tympanic membrane, ear canal and external ear normal.     Left Ear: Tympanic membrane, ear canal and external ear normal.     Mouth/Throat:     Comments: Mask in place  Eyes:     General:        Right eye: No discharge.        Left eye: No discharge.     Extraocular Movements: Extraocular movements intact.     Conjunctiva/sclera: Conjunctivae normal.     Pupils: Pupils are equal,  round, and reactive to light.  Cardiovascular:     Rate and Rhythm: Normal rate and regular rhythm.     Pulses: Normal pulses.     Heart sounds: Normal heart sounds.  Pulmonary:     Effort: Pulmonary effort is normal.     Breath sounds: Normal breath sounds.  Abdominal:     General: Bowel sounds are normal. There is no distension.     Palpations: Abdomen is soft.     Tenderness: There is no right CVA tenderness or left CVA tenderness.  Musculoskeletal:        General: Normal range of motion.     Cervical back: Normal range of motion and neck supple.     Right lower leg: No edema.     Left lower leg: No edema.  Skin:    General: Skin is warm and dry.     Capillary Refill: Capillary refill takes less than 2 seconds.  Neurological:     General: No focal deficit present.     Mental Status: He is alert and oriented to person, place, and time.     Cranial Nerves: Cranial nerves are intact.     Sensory: Sensation is intact.     Motor: Weakness present.     Gait: Gait abnormal.     Comments: Unsteady gait, poor corrdination, rushes to sit and nearly misses the table during exam. Does sideways steps, and shows balance is off during exam   Psychiatric:        Attention and Perception: Attention and perception normal.        Mood and Affect: Mood and affect normal.        Speech: Speech normal.        Behavior: Behavior normal. Behavior is cooperative.        Thought Content: Thought content normal.        Cognition and Memory: Cognition and memory normal.        Judgment: Judgment is impulsive.      Depression Screening  Depression screen [facility] 2/9 04/06/2020 02/11/2020 02/11/2020 01/07/2020 02/10/2019  Decreased Interest 0 0 0 0 0  Down, Depressed, Hopeless 0 0 0 0 0  PHQ - 2 Score 0 0 0 0 0  Altered sleeping - - - - -  Tired, decreased energy - - - - -  Change in appetite - - - - -  Feeling bad or failure about yourself  - - - - -  Trouble concentrating - - - - -  Moving slowly  or fidgety/restless - - - - -  Suicidal thoughts - - - - -  PHQ-9 Score - - - - -     Falls  Fall Risk  04/06/2020 02/11/2020  01/07/2020 12/02/2019 10/02/2019  Falls in the past year? 1 0 0 0 0  Number falls in past yr: 1 0 - 0 0  Injury with Fall? 1 0 - 0 0  Risk for fall due to : Impaired balance/gait No Fall Risks - - -  Follow up Falls evaluation completed Falls evaluation completed - - -    Assessment & Plan:   1. Annual visit for general adult medical examination with abnormal findings   2. Mild protein-calorie malnutrition (HCC)   3. Type 2 diabetes mellitus with both eyes affected by proliferative retinopathy and macular edema, without long-term current use of insulin (HCC)   4. Essential hypertension, benign   5. Gastroesophageal reflux disease, unspecified whether esophagitis present   6. Osteopenia, unspecified location   7. Mixed hyperlipidemia   8. At high risk for falls     Tests ordered No orders of the defined types were placed in this encounter.    Plan: Please see assessment and plan per problem list above.   No orders of the defined types were placed in this encounter.  I have personally reviewed: The patient's medical and social history Their use of alcohol, tobacco or illicit drugs Their current medications and supplements The patient's functional ability including ADLs,fall risks, home safety risks, cognitive, and hearing and visual impairment Diet and physical activities Evidence for depression or mood disorders  The patient's weight, height, BMI, and visual acuity have been recorded in the chart.  I have made referrals, counseling, and provided education to the patient based on review of the above and I have provided the patient with a written personalized care plan for preventive services.     Freddy Finner, NP   04/09/2020

## 2020-04-06 NOTE — Progress Notes (Signed)
Endocrinology Follow Up Visit       04/06/2020, 3:35 PM   Subjective:    Patient ID: Jacob Barnes, male    DOB: [birth year].  Jacob Barnes is being seen in follow up after being seen in consultation for management of currently uncontrolled symptomatic diabetes requested by  Jacob Helper, MD.   Past Medical History:  Diagnosis Date  . Abrasion of right middle finger with infection    for 1 week,   . Anemia   . Arthritis    hands  . Carpal tunnel syndrome 08/05/2009   Qualifier: Diagnosis of  By: Aline Brochure MD, Dorothyann Peng    . Chronic kidney disease   . Diabetes mellitus    says since 1979 type 2  . Diabetes mellitus without complication ([place])    Phreesia 04/03/2020  . ERECTILE DYSFUNCTION, ORGANIC 01/20/2009   Qualifier: Diagnosis of  By: Moshe Cipro MD, Joycelyn Schmid    . GERD (gastroesophageal reflux disease)   . Glaucoma    both eyes  . History of kidney stones   . Hyperlipemia   . Hypertension   . Rash    front abdomen no drainage    Past Surgical History:  Procedure Laterality Date  . AMPUTATION Right 09/01/2016   Procedure: AMPUTATION RIGHT MIDDLE FINGER TIP;  Surgeon: Mcarthur Rossetti, MD;  Location: [facility];  Service: Orthopedics;  Laterality: Right;  . AMPUTATION Right 10/10/2016   Procedure: REPEAT IRRIGATION AND DEBRIDEMENT RIGHT MIDDLE FINGER WITH AMPUTATION THROUGH PROXIMAL PHALANX;  Surgeon: Mcarthur Rossetti, MD;  Location: [facility];  Service: Orthopedics;  Laterality: Right;  . APPENDECTOMY    . COLONOSCOPY  09/06/2011   Procedure: COLONOSCOPY;  Surgeon: Rogene Houston, MD;  Location: [facility];  Service: Endoscopy;  Laterality: N/A;  830  . CYSTOSCOPY/RETROGRADE/URETEROSCOPY/STONE EXTRACTION WITH BASKET    . ESOPHAGOGASTRODUODENOSCOPY N/A 01/15/2014   Procedure: ESOPHAGOGASTRODUODENOSCOPY (EGD);  Surgeon: Rogene Houston, MD;  Location: [facility];  Service: Endoscopy;  Laterality:  N/A;  200  . ESOPHAGOGASTRODUODENOSCOPY N/A 04/22/2014   Procedure: ESOPHAGOGASTRODUODENOSCOPY (EGD);  Surgeon: Rogene Houston, MD;  Location: [facility];  Service: Endoscopy;  Laterality: N/A;  240  . EYE SURGERY Bilateral yrs ago   ioc for cataract  . MALONEY DILATION N/A 01/15/2014   Procedure: MALONEY DILATION;  Surgeon: Rogene Houston, MD;  Location: [facility];  Service: Endoscopy;  Laterality: N/A;  . MALONEY DILATION  04/22/2014   Procedure: Venia Minks DILATION;  Surgeon: Rogene Houston, MD;  Location: [facility];  Service: Endoscopy;;  . surgery for left eye bleedf  2017    Social History   Socioeconomic History  . Marital status: Married    Spouse name: Not on file  . Number of children: Not on file  . Years of education: Not on file  . Highest education level: Not on file  Occupational History  . Not on file  Tobacco Use  . Smoking status: Never Smoker  . Smokeless tobacco: Never Used  Vaping Use  . Vaping Use: Never used  Substance and Sexual Activity  . Alcohol use: No  . Drug use: No  . Sexual activity: Yes  Other Topics Concern  . Not on file  Social History Narrative  . Not on file   Social Determinants of Health   Financial Resource Strain: Low Risk   . Difficulty of Paying Living Expenses: Not hard at all  Food Insecurity: No Food Insecurity  . Worried About Charity fundraiser in the Last Year: Never true  . Ran Out of Food in the Last Year: Never true  Transportation Needs: No Transportation Needs  . Lack of Transportation (Medical): No  . Lack of Transportation (Non-Medical): No  Physical Activity: Insufficiently Active  . Days of Exercise per Week: 5 days  . Minutes of Exercise per Session: 10 min  Stress: No Stress Concern Present  . Feeling of Stress : Not at all  Social Connections: Moderately Isolated  . Frequency of Communication with Friends and Family: More than three times a week  . Frequency of Social Gatherings with Friends  and Family: More than three times a week  . Attends Religious Services: Never  . Active Member of Clubs or Organizations: No  . Attends Archivist Meetings: Never  . Marital Status: Married    Family History  Problem Relation Age of Onset  . Diabetes Mother   . Hypertension Mother   . Hyperlipidemia Mother   . Stroke Mother   . Heart failure Mother   . Diabetes Father   . Heart disease Father   . Hypertension Father   . Hyperlipidemia Father   . Heart attack Father   . Diabetes Brother   . Diabetes Brother   . Diabetes Son     Outpatient Encounter Medications as of 04/06/2020  Medication Sig  . benazepril (LOTENSIN) 10 MG tablet TAKE 1 TABLET BY MOUTH EVERY DAY  . blood glucose meter kit and supplies Dispense based on patient and insurance preference. Use up to three times daily as directed. (dx E11.65).  . Calcium Carbonate (CALCIUM 600 PO) Take 1 tablet by mouth daily.  . Cholecalciferol (VITAMIN D3 PO) Take 4,000 Units by mouth daily.  Marland Kitchen glipiZIDE (GLUCOTROL XL) 2.5 MG 24 hr tablet Take 1 tablet (2.5 mg total) by mouth daily with breakfast.  . glucose blood (ONETOUCH VERIO) test strip USE as instrusted UP TO four times daily  . latanoprost (XALATAN) 0.005 % ophthalmic solution INSTILL ONE DROP IN EACH EYE AT BEDTIME  . linagliptin (TRADJENTA) 5 MG TABS tablet Take 1 tablet (5 mg total) by mouth daily.  . pantoprazole (PROTONIX) 40 MG tablet TAKE 1 TABLET BY MOUTH EVERY DAY  . rosuvastatin (CRESTOR) 5 MG tablet Take one tablet by mouth every Monday , Wednesday and Friday  . timolol (TIMOPTIC) 0.5 % ophthalmic solution INSTILL ONE DROP IN THE RIGHT EYE EVERY MORNING  . Iron, Ferrous Sulfate, 325 (65 Fe) MG TABS Take one tablet by mouth once daily (Patient not taking: Reported on 04/06/2020)   No facility-administered encounter medications on file as of 04/06/2020.    ALLERGIES: Allergies  Allergen Reactions  . No Known Allergies     VACCINATION  STATUS: Immunization History  Administered Date(s) Administered  . Fluad Quad(high Dose 65+) 02/27/2019  . Influenza,inj,Quad PF,6+ Mos 03/17/2013, 03/17/2014, 03/18/2015, 01/04/2016, 12/05/2016, 12/02/2019  . Moderna Sars-Covid-2 Vaccination 06/23/2019, 07/21/2019  . PPD Test 08/16/2010, 08/18/2010, 11/03/2013  . Pneumococcal Conjugate-13 11/03/2013  . Pneumococcal Polysaccharide-23 12/21/2008  . Td 12/21/2008    Diabetes He presents for his initial diabetic visit. He has type 2 diabetes mellitus. Onset time: He was diagnosed at  approximate age of 42 years. His disease course has been worsening. There are no hypoglycemic associated symptoms. Pertinent negatives for hypoglycemia include no confusion, headaches, pallor or seizures. There are no diabetic associated symptoms. Pertinent negatives for diabetes include no chest pain, no fatigue, no polydipsia, no polyphagia, no polyuria and no weakness. There are no hypoglycemic complications. Symptoms are stable. Diabetic complications include impotence, nephropathy, peripheral neuropathy and PVD. Risk factors for coronary artery disease include diabetes mellitus, dyslipidemia, hypertension, male sex and sedentary lifestyle. Current diabetic treatment includes oral agent (dual therapy). He is compliant with treatment most of the time. His weight is fluctuating minimally. He is following a generally unhealthy diet. When asked about meal planning, he reported none. He has not had a previous visit with a dietitian. He never participates in exercise. His home blood glucose trend is increasing steadily. His overall blood glucose range is 180-200 mg/dl. (He presents today, accompanied by his wife, with no meter or logs to review.  His POCT A1c today is 10.5%, increasing from last visit of 7.7%.  He denies any s/s of hypoglycemia.  He states he hasn't done very well on his diet lately due to the holidays.) An ACE inhibitor/angiotensin II receptor blocker is being  taken. He does not see a podiatrist.Eye exam is current.  Hyperlipidemia This is a chronic problem. The current episode started more than 1 year ago. The problem is controlled. Recent lipid tests were reviewed and are normal. Exacerbating diseases include chronic renal disease and diabetes. There are no known factors aggravating his hyperlipidemia. Pertinent negatives include no chest pain, myalgias or shortness of breath. Current antihyperlipidemic treatment includes statins. The current treatment provides moderate improvement of lipids. There are no compliance problems.  Risk factors for coronary artery disease include dyslipidemia, diabetes mellitus, hypertension, male sex, a sedentary lifestyle and family history.  Hypertension This is a chronic problem. The current episode started more than 1 year ago. The problem has been gradually improving since onset. The problem is controlled. Pertinent negatives include no chest pain, headaches, neck pain, palpitations or shortness of breath. There are no associated agents to hypertension. Risk factors for coronary artery disease include diabetes mellitus, dyslipidemia, male gender and sedentary lifestyle. Past treatments include ACE inhibitors. The current treatment provides moderate improvement. There are no compliance problems.  Hypertensive end-organ damage includes kidney disease and PVD. Identifiable causes of hypertension include chronic renal disease.    Review of systems  Constitutional: + Minimally fluctuating body weight,  current Body mass index is 19.8 kg/m. , no fatigue, no subjective hyperthermia, no subjective hypothermia Eyes: no blurry vision, no xerophthalmia ENT: no sore throat, no nodules palpated in throat, no dysphagia/odynophagia, no hoarseness Cardiovascular: no chest pain, no shortness of breath, no palpitations, no leg swelling Respiratory: no cough, no shortness of breath Gastrointestinal: no  nausea/vomiting/diarrhea Musculoskeletal: no muscle/joint aches, multiple falls, walks with cane for disequilibrium and deconditioning Skin: no rashes, no hyperemia Neurological: no tremors, no numbness, no tingling, no dizziness Psychiatric: no depression, no anxiety   Objective:    BP 125/72 (BP Location: Left Arm, Patient Position: Sitting)   Pulse 68   Ht 5' 5" (1.651 m)   Wt 119 lb (54 kg)   BMI 19.80 kg/m   Wt Readings from Last 3 Encounters:  04/06/20 119 lb (54 kg)  04/06/20 119 lb (54 kg)  02/11/20 124 lb (56.2 kg)    BP Readings from Last 3 Encounters:  04/06/20 125/72  04/06/20 130/70  01/28/20 140/72  Physical Exam- Limited  Constitutional:  Body mass index is 19.8 kg/m. , not in acute distress, normal state of mind Eyes:  EOMI, no exophthalmos Neck: Supple Cardiovascular: RRR, no murmers, rubs, or gallops, no edema Respiratory: Adequate breathing efforts, no crackles, rales, rhonchi, or wheezing Musculoskeletal: no gross deformities, strength intact in all four extremities, no gross restriction of joint movements Skin:  no rashes, no hyperemia Neurological: no tremor with outstretched hands   POCT ABI Results 04/06/20   Right ABI:  1.34      Left ABI:  1.32  Right leg systolic / diastolic: 725/36 mmHg Left leg systolic / diastolic: 644/03 mmHg  Arm systolic / diastolic: 474/25 mmHG  Detailed report will be scanned into patient chart.   CMP ( most recent) CMP     Component Value Date/Time   NA 139 03/31/2020 0848   K 4.5 03/31/2020 0848   CL 101 03/31/2020 0848   CO2 26 03/31/2020 0848   GLUCOSE 187 (H) 03/31/2020 0848   GLUCOSE 101 (H) 09/30/2019 1008   BUN 12 03/31/2020 0848   CREATININE 1.31 (H) 03/31/2020 0848   CREATININE 1.36 (H) 09/30/2019 1008   CALCIUM 9.4 03/31/2020 0848   PROT 8.6 (H) 03/31/2020 0848   ALBUMIN 4.3 03/31/2020 0848   AST 16 03/31/2020 0848   ALT 8 03/31/2020 0848   ALKPHOS 109 03/31/2020 0848   BILITOT 0.4  03/31/2020 0848   GFRNONAA 51 (L) 03/31/2020 0848   GFRNONAA 49 (L) 09/30/2019 1008   GFRAA 59 (L) 03/31/2020 0848   GFRAA 57 (L) 09/30/2019 1008     Diabetic Labs (most recent): Lab Results  Component Value Date   HGBA1C 10.5 (A) 04/06/2020   HGBA1C 7.7 (A) 01/07/2020   HGBA1C 9.4 (H) 09/30/2019     Lipid Panel ( most recent) Lipid Panel     Component Value Date/Time   CHOL 114 09/30/2019 1008   TRIG 42 09/30/2019 1008   HDL 44 09/30/2019 1008   CHOLHDL 2.6 09/30/2019 1008   VLDL 7 11/27/2016 1000   LDLCALC 58 09/30/2019 1008      Lab Results  Component Value Date   TSH 1.300 03/31/2020   TSH 1.77 09/23/2018   TSH 0.61 07/19/2015   TSH 0.952 03/18/2015   TSH 0.921 12/11/2013   TSH 1.392 10/29/2012   TSH 0.615 09/26/2011   TSH 1.573 12/21/2008   FREET4 1.26 03/31/2020      Assessment & Plan:   1) Uncontrolled type 2 diabetes mellitus with hyperglycemia (Jacob Barnes)  - Jacob Barnes has currently uncontrolled symptomatic type 2 DM since  79 years of age.  He presents today, accompanied by his wife, with no meter or logs to review.  His POCT A1c today is 10.5%, increasing from last visit of 7.7%.  He denies any s/s of hypoglycemia.  He states he hasn't done very well on his diet lately due to the holidays.   Recent labs reviewed.  - I had a long discussion with him about the progressive nature of diabetes and the pathology behind its complications. -his diabetes is complicated by peripheral arterial disease and he remains at a high risk for more acute and chronic complications which include CAD, CVA, CKD, retinopathy, and neuropathy. These are all discussed in detail with him.  - Nutritional counseling repeated at each appointment due to patients tendency to fall back in to old habits.  - The patient admits there is a room for improvement in their diet and drink choices. -  Suggestion is made for the patient to avoid simple carbohydrates from their diet including  Cakes, Sweet Desserts / Pastries, Ice Cream, Soda (diet and regular), Sweet Tea, Candies, Chips, Cookies, Sweet Pastries,  Store Bought Juices, Alcohol in Excess of  1-2 drinks a day, Artificial Sweeteners, Coffee Creamer, and "Sugar-free" Products. This will help patient to have stable blood glucose profile and potentially avoid unintended weight gain.   - I encouraged the patient to switch to  unprocessed or minimally processed complex starch and increased protein intake (animal or plant source), fruits, and vegetables.   - Patient is advised to stick to a routine mealtimes to eat 3 meals  a day and avoid unnecessary snacks ( to snack only to correct hypoglycemia).  - he will be scheduled with Jearld Fenton, RDN, CDE for diabetes education.  - I have approached him with the following individualized plan to manage  his diabetes and patient agrees:   -In light of his advanced age and light body weight, avoiding hypoglycemia should be the number 1 priority in his care.   -Given his recent loss of control, he was offered the chance to increase his Glipizide to 5 mg XL, however, he says it causes him to have diarrhea, which he wants to avoid.  I also discussed the potential of adding basal insulin (which may help him put on some weight as well), but he wants to see if he can regain control with his current medications and diet alone, first.  -He is advised to continue Tradjenta 5 mg po daily and Glipizide 2.5 mg XL daily.  -He is encouraged to continue monitoring blood glucose at least once daily, before breakfast, and to call the clinic if he has readings less than 70 or greater than 300 for 3 tests in a row.  - he is not a candidate for Metformin due to concurrent renal insufficiency.  - he is not a candidate for incretin therapy due to his body habitus.   - Specific targets for  A1c;  LDL, HDL,  and Triglycerides were discussed with the patient.  2) Blood Pressure /Hypertension:   his blood  pressure is controlled to target.   he is advised to continue his current medications including Benazepril 10 mg p.o. daily with breakfast .  3) Lipids/Hyperlipidemia:    His most recent lipid panel from 09/30/19 shows controlled LDL at 58.  He is advised to continue Crestor 5 mg po daily at bedtime.  Side effects and precautions discussed with him.  4)  Weight/Diet:  His Body mass index is 19.8 kg/m.   he is not a candidate for weight loss.   Exercise, and detailed carbohydrates information provided  -  detailed on discharge instructions.  5) Osteopenia: -He will not need bisphosphonate intervention at this time.  He is advised to continue on his vitamin D 4000 units daily, as well as calcium carbonate 600 mg p.o. daily.  6) Chronic Care/Health Maintenance: -he is on ACEI/ARB and Statin medications and  is encouraged to initiate and continue to follow up with Ophthalmology, Dentist,  Podiatrist at least yearly or according to recommendations, and advised to   stay away from smoking. I have recommended yearly flu vaccine and pneumonia vaccine at least every 5 years; moderate intensity exercise for up to 150 minutes weekly; and  sleep for at least 7 hours a day.  - he is  advised to maintain close follow up with Jacob Helper, MD for primary care needs, as  well as his other providers for optimal and coordinated care.   - Time spent on this patient care encounter:  35 min, of which > 50% was spent in  counseling and the rest reviewing his blood glucose logs , discussing his hypoglycemia and hyperglycemia episodes, reviewing his current and  previous labs / studies  ( including abstraction from other facilities) and medications  doses and developing a  long term treatment plan and documenting his care.   Please refer to Patient Instructions for Blood Glucose Monitoring and Insulin/Medications Dosing Guide"  in media tab for additional information. Please  also refer to " Patient Self Inventory"  in the Media  tab for reviewed elements of pertinent patient history.  Rhett Bannister participated in the discussions, expressed understanding, and voiced agreement with the above plans.  All questions were answered to his satisfaction. he is encouraged to contact clinic should he have any questions or concerns prior to his return visit.   Follow up plan: - Return in about 3 months (around 07/05/2020) for Diabetes follow up- A1c and urine micro in office, No previsit labs, Bring glucometer and logs.  Rayetta Pigg, [facility] [address] [place], [place] [address] Phone: [phone] Fax: [phone]   04/06/2020, 3:35 PM

## 2020-04-06 NOTE — Patient Instructions (Signed)
I appreciate the opportunity to provide you with care for your health and wellness. Today we discussed: overall health   Follow up: 4-5 weeks for wt check and fall risk assessment   No labs or referrals today  GOALS:  Walk and move with intention. Take your time moving. Feel and look for the chair or step so you don't miss it.  Start drinking Glucerna at least 2 times daily, if not 3. Best to drink in between meals, but can use as meal supplement.  Great to see y'all today. Happy New Year!  Please continue to practice social distancing to keep you, your family, and our community safe.  If you must go out, please wear a mask and practice good handwashing.  It was a pleasure to see you and I look forward to continuing to work together on your health and well-being. Please do not hesitate to call the office if you need care or have questions about your care.  Have a wonderful day. With Gratitude, Tereasa Coop, DNP, AGNP-BC

## 2020-04-06 NOTE — Patient Instructions (Signed)

## 2020-04-09 ENCOUNTER — Encounter: Payer: Self-pay | Admitting: Family Medicine

## 2020-04-09 DIAGNOSIS — Z9181 History of falling: Secondary | ICD-10-CM | POA: Insufficient documentation

## 2020-04-09 NOTE — Assessment & Plan Note (Signed)
History of multiple falls.  Reviewed detail at home and how to prevent falls in the future.  Demonstration of how to walk and use cane in office.  Close follow-up to see if physical therapy will be needed he declined it today.  I do think that physical therapy would benefit him with helping with balance and mobility daughter says that she will talk to him about considering it at his next visit for weight and mobility follow-up.

## 2020-04-09 NOTE — Assessment & Plan Note (Signed)
Is currently on calcium carbonate, vitamin D followed by Dr. Fransico Him

## 2020-04-09 NOTE — Assessment & Plan Note (Signed)
Poorly controlled diabetes secondary to not eating on a regular schedule.  He is followed by Dr. Fransico Him.  Reports taking medications as directed.   He is on a statin and ACE

## 2020-04-09 NOTE — Assessment & Plan Note (Signed)
Discussed PSA screening (risks/benefits), recommended at least 30 minutes of aerobic activity at least 5 days/week; proper sunscreen use reviewed; healthy diet and alcohol recommendations (less than or equal to 2 drinks/day) reviewed; regular seatbelt use; changing batteries in smoke detectors. Immunization recommendations discussed.

## 2020-04-09 NOTE — Assessment & Plan Note (Signed)
Unexpected weight loss.  Diet change.  Lack of appetite.  Could be diabetic related.  Advised for supplementation with Glucerna.  Close follow-up for weight check.

## 2020-04-09 NOTE — Assessment & Plan Note (Signed)
Heart healthy diet is encouraged, continue Crestor three times weekly

## 2020-04-09 NOTE — Assessment & Plan Note (Signed)
Blood pressure controlled today in the office.  Continue DASH diet, exercise as tolerated and safe and inactive.

## 2020-04-29 ENCOUNTER — Telehealth: Payer: Self-pay

## 2020-04-29 NOTE — Telephone Encounter (Signed)
I spoke with pt and he states that he is having diarrhea and it is worse after he eats for the last two days. No abdominal pain, no nausea no fever. Pt advised to try a bland BRATY diet along with good hydration and immodium for the next few days. If no relief or sx's worsen. Advised to call back for an available appt.

## 2020-04-29 NOTE — Telephone Encounter (Signed)
pts sister Devern called for the PT, he is having loose stool, and feels weak.  Please call and advise what to do no appts available

## 2020-04-29 NOTE — Telephone Encounter (Signed)
Call [phone]

## 2020-05-05 ENCOUNTER — Encounter: Payer: Self-pay | Admitting: Nurse Practitioner

## 2020-05-05 ENCOUNTER — Other Ambulatory Visit: Payer: Self-pay

## 2020-05-05 ENCOUNTER — Ambulatory Visit (INDEPENDENT_AMBULATORY_CARE_PROVIDER_SITE_OTHER): Payer: Medicare HMO | Admitting: Nurse Practitioner

## 2020-05-05 DIAGNOSIS — E441 Mild protein-calorie malnutrition: Secondary | ICD-10-CM

## 2020-05-05 NOTE — Assessment & Plan Note (Addendum)
Wt Readings from Last 3 Encounters:  05/05/20 121 lb (54.9 kg)  04/06/20 119 lb (54 kg)  04/06/20 119 lb (54 kg)   -his weight is stable -he has been drinking glucerna and that has helped him gain weight -like vanilla and strawberry, not choclate

## 2020-05-05 NOTE — Progress Notes (Signed)
Acute Office Visit  Subjective:    Patient ID: Jacob Barnes, male    DOB: 1940/05/22, 80 y.o.   MRN: [id number]  Chief Complaint  Patient presents with  . Follow-up    HPI Patient is in today for weight check. He denies polydipsia, polyphagia, and polyuria. He has been drinking glucerna. He does not like chocolate, but he like the vanilla and strawberry.  Past Medical History:  Diagnosis Date  . Abrasion of right middle finger with infection    for 1 week,   . 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 Anemia   . Arthritis    hands  . Carpal tunnel syndrome 08/05/2009   Qualifier: Diagnosis of  By: Aline Brochure MD, Dorothyann Peng    . Chronic kidney disease   . Diabetes mellitus    says since 1979 type 2  . Diabetes mellitus without complication ([place])    Phreesia 04/03/2020  . ERECTILE DYSFUNCTION, ORGANIC 01/20/2009   Qualifier: Diagnosis of  By: Moshe Cipro MD, Joycelyn Schmid    . GERD (gastroesophageal reflux disease)   . Glaucoma    both eyes  . History of kidney stones   . Hyperlipemia   . Hypertension   . Iron deficiency anemia 01/10/2014  . Other pancytopenia ([place]) 01/10/2014   New in 01/2014, undergoing pathology review   . Rash    front abdomen no drainage    Past Surgical History:  Procedure Laterality Date  . AMPUTATION Right 09/01/2016   Procedure: AMPUTATION RIGHT MIDDLE FINGER TIP;  Surgeon: Mcarthur Rossetti, MD;  Location: [facility];  Service: Orthopedics;  Laterality: Right;  . AMPUTATION Right 10/10/2016   Procedure: REPEAT IRRIGATION AND DEBRIDEMENT RIGHT MIDDLE FINGER WITH AMPUTATION THROUGH PROXIMAL PHALANX;  Surgeon: Mcarthur Rossetti, MD;  Location: [facility];  Service: Orthopedics;  Laterality: Right;  . APPENDECTOMY    . COLONOSCOPY  09/06/2011   Procedure: COLONOSCOPY;  Surgeon: Rogene Houston, MD;  Location: [facility];  Service: Endoscopy;  Laterality: N/A;  830  . CYSTOSCOPY/RETROGRADE/URETEROSCOPY/STONE EXTRACTION WITH BASKET    .  ESOPHAGOGASTRODUODENOSCOPY N/A 01/15/2014   Procedure: ESOPHAGOGASTRODUODENOSCOPY (EGD);  Surgeon: Rogene Houston, MD;  [facility]: [facility];  Service: Endoscopy;  Laterality: N/A;  200  . ESOPHAGOGASTRODUODENOSCOPY N/A 04/22/2014   Procedure: ESOPHAGOGASTRODUODENOSCOPY (EGD);  Surgeon: Rogene Houston, MD;  Location: [facility];  Service: Endoscopy;  Laterality: N/A;  240  . EYE SURGERY Bilateral yrs ago   ioc for cataract  . MALONEY DILATION N/A 01/15/2014   Procedure: MALONEY DILATION;  Surgeon: Rogene Houston, MD;  Location: [facility];  Service: Endoscopy;  Laterality: N/A;  . MALONEY DILATION  04/22/2014   Procedure: Venia Minks DILATION;  Surgeon: Rogene Houston, MD;  Location: [facility];  Service: Endoscopy;;  . surgery for left eye bleedf  2017    Family History  Problem Relation Age of Onset  . Diabetes Mother   . Hypertension Mother   . Hyperlipidemia Mother   . Stroke Mother   . Heart failure Mother   . Diabetes Father   . Heart disease Father   . Hypertension Father   . Hyperlipidemia Father   . Heart attack Father   . Diabetes Brother   . Diabetes Brother   . Diabetes Son     Social History   Socioeconomic History  . Marital status: Married    Spouse name: Not on file  . Number of children: Not on file  . Years  of education: Not on file  . Highest education level: Not on file  Occupational History  . Not on file  Tobacco Use  . Smoking status: Never Smoker  . Smokeless tobacco: Never Used  Vaping Use  . Vaping Use: Never used  Substance and Sexual Activity  . Alcohol use: No  . Drug use: No  . Sexual activity: Yes  Other Topics Concern  . Not on file  Social History Narrative  . Not on file   Social Determinants of Health   Financial Resource Strain: Low Risk   . Difficulty of Paying Living Expenses: Not hard at all  Food Insecurity: No Food Insecurity  . Worried About Charity fundraiser in the Last Year: Never true  . Ran Out of Food  in the Last Year: Never true  Transportation Needs: No Transportation Needs  . Lack of Transportation (Medical): No  . Lack of Transportation (Non-Medical): No  Physical Activity: Insufficiently Active  . Days of Exercise per Week: 5 days  . Minutes of Exercise per Session: 10 min  Stress: No Stress Concern Present  . Feeling of Stress : Not at all  Social Connections: Moderately Isolated  . Frequency of Communication with Friends and Family: More than three times a week  . Frequency of Social Gatherings with Friends and Family: More than three times a week  . Attends Religious Services: Never  . Active Member of Clubs or Organizations: No  . Attends Archivist Meetings: Never  . Marital Status: Married  Human resources officer Violence: Not At Risk  . Fear of Current or Ex-Partner: No  . Emotionally Abused: No  . Physically Abused: No  . Sexually Abused: No    Outpatient Medications Prior to Visit  Medication Sig Dispense Refill  . benazepril (LOTENSIN) 10 MG tablet TAKE 1 TABLET BY MOUTH EVERY DAY 90 tablet 1  . blood glucose meter kit and supplies Dispense based on patient and insurance preference. Use up to three times daily as directed. (dx E11.65). 1 each 0  . Calcium Carbonate (CALCIUM 600 PO) Take 1 tablet by mouth daily.    . Cholecalciferol (VITAMIN D3 PO) Take 4,000 Units by mouth daily.    Marland Kitchen glipiZIDE (GLUCOTROL XL) 2.5 MG 24 hr tablet Take 1 tablet (2.5 mg total) by mouth daily with breakfast. 90 tablet 3  . glucose blood (ONETOUCH VERIO) test strip USE as instrusted UP TO four times daily 1 each 2  . Iron, Ferrous Sulfate, 325 (65 Fe) MG TABS Take one tablet by mouth once daily 90 tablet 1  . latanoprost (XALATAN) 0.005 % ophthalmic solution INSTILL ONE DROP IN EACH EYE AT BEDTIME    . linagliptin (TRADJENTA) 5 MG TABS tablet Take 1 tablet (5 mg total) by mouth daily. 90 tablet 1  . pantoprazole (PROTONIX) 40 MG tablet TAKE 1 TABLET BY MOUTH EVERY DAY 30 tablet 3   . rosuvastatin (CRESTOR) 5 MG tablet Take one tablet by mouth every Monday , Wednesday and Friday 36 tablet 1  . timolol (TIMOPTIC) 0.5 % ophthalmic solution INSTILL ONE DROP IN THE RIGHT EYE EVERY MORNING     No facility-administered medications prior to visit.    Allergies  Allergen Reactions  . No Known Allergies     Review of Systems  Constitutional: Negative.   Respiratory: Negative.   Cardiovascular: Negative.   Endocrine: Negative.        Objective:    Physical Exam Constitutional:  Appearance: Normal appearance.  Cardiovascular:     Rate and Rhythm: Normal rate and regular rhythm.     Pulses: Normal pulses.     Heart sounds: Normal heart sounds.  Pulmonary:     Effort: Pulmonary effort is normal.  Musculoskeletal:     Comments: Uses cane for ambulation  Neurological:     Mental Status: He is alert.     BP 138/73 (BP Location: Left Arm, Patient Position: Sitting, Cuff Size: Normal)   Pulse 83   Temp (!) 97.5 F (36.4 C) (Temporal)   Ht '5\' 5"'  (1.651 m)   Wt 121 lb (54.9 kg)   SpO2 99%   BMI 20.14 kg/m  Wt Readings from Last 3 Encounters:  05/05/20 121 lb (54.9 kg)  04/06/20 119 lb (54 kg)  04/06/20 119 lb (54 kg)    Health Maintenance Due  Topic Date Due  . OPHTHALMOLOGY EXAM  10/09/2017  . TETANUS/TDAP  12/22/2018  . FOOT EXAM  02/27/2020    There are no preventive care reminders to display for this patient.   Lab Results  Component Value Date   TSH 1.300 03/31/2020   Lab Results  Component Value Date   WBC 4.2 09/30/2019   HGB 12.2 (L) 09/30/2019   HCT 36.4 (L) 09/30/2019   MCV 94.3 09/30/2019   PLT 118 (L) 09/30/2019   Lab Results  Component Value Date   NA 139 03/31/2020   K 4.5 03/31/2020   CO2 26 03/31/2020   GLUCOSE 187 (H) 03/31/2020   BUN 12 03/31/2020   CREATININE 1.31 (H) 03/31/2020   BILITOT 0.4 03/31/2020   ALKPHOS 109 03/31/2020   AST 16 03/31/2020   ALT 8 03/31/2020   PROT 8.6 (H) 03/31/2020   ALBUMIN  4.3 03/31/2020   CALCIUM 9.4 03/31/2020   ANIONGAP 7 10/10/2016   Lab Results  Component Value Date   CHOL 114 09/30/2019   Lab Results  Component Value Date   HDL 44 09/30/2019   Lab Results  Component Value Date   LDLCALC 58 09/30/2019   Lab Results  Component Value Date   TRIG 42 09/30/2019   Lab Results  Component Value Date   CHOLHDL 2.6 09/30/2019   Lab Results  Component Value Date   HGBA1C 10.5 (A) 04/06/2020       Assessment & Plan:   Problem List Items Addressed This Visit   None      No orders of the defined types were placed in this encounter.    Noreene Larsson, NP

## 2020-05-05 NOTE — Patient Instructions (Signed)
We will meet back up in 3 months for a lab follow-up.  Your weight has improved by 2 pounds since your last visit, so continue to use that Glucerna.

## 2020-06-15 ENCOUNTER — Other Ambulatory Visit: Payer: Self-pay

## 2020-06-15 ENCOUNTER — Telehealth (INDEPENDENT_AMBULATORY_CARE_PROVIDER_SITE_OTHER): Payer: Medicare HMO | Admitting: Family Medicine

## 2020-06-15 DIAGNOSIS — K529 Noninfective gastroenteritis and colitis, unspecified: Secondary | ICD-10-CM

## 2020-06-15 DIAGNOSIS — I10 Essential (primary) hypertension: Secondary | ICD-10-CM | POA: Diagnosis not present

## 2020-06-15 DIAGNOSIS — E1165 Type 2 diabetes mellitus with hyperglycemia: Secondary | ICD-10-CM | POA: Diagnosis not present

## 2020-06-15 DIAGNOSIS — K921 Melena: Secondary | ICD-10-CM

## 2020-06-15 NOTE — Patient Instructions (Addendum)
Needs appointment in office tomorrow evaluate 4 week h/o diarrhea  And black stool, please call patient and confirm appointment, may speak to his wife Melvyn Hommes who is less hearing impaired and provide appointment information, and also add her on DPR, verify again with Stevan  Needs f/u for diabetes first week in April, and HBa1C March 28 or shortly after  Glipizide does not cause loose stool, continue the glipizide and the tradjenta
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
## 2020-06-16 ENCOUNTER — Encounter: Payer: Self-pay | Admitting: Internal Medicine

## 2020-06-16 ENCOUNTER — Other Ambulatory Visit: Payer: Self-pay

## 2020-06-16 ENCOUNTER — Ambulatory Visit (INDEPENDENT_AMBULATORY_CARE_PROVIDER_SITE_OTHER): Payer: Medicare HMO | Admitting: Internal Medicine

## 2020-06-16 VITALS — BP 156/88 | HR 67 | Resp 18 | Ht 65.0 in | Wt 124.1 lb

## 2020-06-16 DIAGNOSIS — K5900 Constipation, unspecified: Secondary | ICD-10-CM | POA: Diagnosis not present

## 2020-06-16 DIAGNOSIS — R195 Other fecal abnormalities: Secondary | ICD-10-CM

## 2020-06-16 DIAGNOSIS — I10 Essential (primary) hypertension: Secondary | ICD-10-CM | POA: Diagnosis not present

## 2020-06-16 MED ORDER — DOCUSATE SODIUM 100 MG PO CAPS: 100.0000 mg | ORAL_CAPSULE | Freq: Two times a day (BID) | ORAL | 0 refills | Status: DC

## 2020-06-16 MED ORDER — DOCUSATE SODIUM 100 MG PO CAPS: 100.0000 mg | ORAL_CAPSULE | Freq: Every day | ORAL | 1 refills | Status: DC | PRN

## 2020-06-16 NOTE — Patient Instructions (Signed)
Please start taking Docusate as prescribed for constipation.  Please maintain water intake around 60 ounces per day to avoid dehydration and constipation.

## 2020-06-16 NOTE — Progress Notes (Signed)
Established Patient Office Visit  Subjective:  Patient ID: Jacob Barnes, male    DOB: 10/02/1940  Age: 80 y.o. MRN: [id number]  CC:  Chief Complaint  Patient presents with  . dark stool    Pt has been having dark stools for awhile on and off     HPI Jacob Barnes presents for evaluation of intermittent dark stools. He has a virtual visit yesterday with Dr Moshe Cipro, where he mentioned about having dark stools at times. He denies any hematochezia. He has been experiencing constipation, but denies straining while passing stools. Denies any pain in the rectal area. Of note, he takes iron supplements for anemia. He also takes Protonix for GERD. Denies any fever, chills or weight loss.  Past Medical History:  Diagnosis Date  . Abrasion of right middle finger with infection    for 1 week,   . 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 Anemia   . Arthritis    hands  . Carpal tunnel syndrome 08/05/2009   Qualifier: Diagnosis of  By: Aline Brochure MD, Dorothyann Peng    . Chronic kidney disease   . Diabetes mellitus    says since 1979 type 2  . Diabetes mellitus without complication ([place])    Phreesia 04/03/2020  . ERECTILE DYSFUNCTION, ORGANIC 01/20/2009   Qualifier: Diagnosis of  By: Moshe Cipro MD, Joycelyn Schmid    . GERD (gastroesophageal reflux disease)   . Glaucoma    both eyes  . History of kidney stones   . Hyperlipemia   . Hypertension   . Iron deficiency anemia 01/10/2014  . Other pancytopenia ([place]) 01/10/2014   New in 01/2014, undergoing pathology review   . Rash    front abdomen no drainage    Past Surgical History:  Procedure Laterality Date  . AMPUTATION Right 09/01/2016   Procedure: AMPUTATION RIGHT MIDDLE FINGER TIP;  Surgeon: Mcarthur Rossetti, MD;  Location: [facility];  Service: Orthopedics;  Laterality: Right;  . AMPUTATION Right 10/10/2016   Procedure: REPEAT IRRIGATION AND DEBRIDEMENT RIGHT MIDDLE FINGER WITH AMPUTATION THROUGH PROXIMAL PHALANX;  Surgeon: Mcarthur Rossetti,  MD;  Location: [facility];  Service: Orthopedics;  Laterality: Right;  . APPENDECTOMY    . COLONOSCOPY  09/06/2011   Procedure: COLONOSCOPY;  Surgeon: Rogene Houston, MD;  Location: [facility];  Service: Endoscopy;  Laterality: N/A;  830  . CYSTOSCOPY/RETROGRADE/URETEROSCOPY/STONE EXTRACTION WITH BASKET    . ESOPHAGOGASTRODUODENOSCOPY N/A 01/15/2014   Procedure: ESOPHAGOGASTRODUODENOSCOPY (EGD);  Surgeon: Rogene Houston, MD;  Location: [facility];  Service: Endoscopy;  Laterality: N/A;  200  . ESOPHAGOGASTRODUODENOSCOPY N/A 04/22/2014   Procedure: ESOPHAGOGASTRODUODENOSCOPY (EGD);  Surgeon: Rogene Houston, MD;  Location: [facility];  Service: Endoscopy;  Laterality: N/A;  240  . EYE SURGERY Bilateral yrs ago   ioc for cataract  . MALONEY DILATION N/A 01/15/2014   Procedure: MALONEY DILATION;  Surgeon: Rogene Houston, MD;  Location: [facility];  Service: Endoscopy;  Laterality: N/A;  . MALONEY DILATION  04/22/2014   Procedure: Venia Minks DILATION;  Surgeon: Rogene Houston, MD;  Location: [facility];  Service: Endoscopy;;  . surgery for left eye bleedf  2017    Family History  Problem Relation Age of Onset  . Diabetes Mother   . Hypertension Mother   . Hyperlipidemia Mother   . Stroke Mother   . Heart failure Mother   . Diabetes Father   . Heart disease Father   . Hypertension Father   .  Hyperlipidemia Father   . Heart attack Father   . Diabetes Brother   . Diabetes Brother   . Diabetes Son     Social History   Socioeconomic History  . Marital status: Married    Spouse name: Not on file  . Number of children: Not on file  . Years of education: Not on file  . Highest education level: Not on file  Occupational History  . Not on file  Tobacco Use  . Smoking status: Never Smoker  . Smokeless tobacco: Never Used  Vaping Use  . Vaping Use: Never used  Substance and Sexual Activity  . Alcohol use: No  . Drug use: No  . Sexual activity: Yes  Other Topics Concern  .  Not on file  Social History Narrative  . Not on file   Social Determinants of Health   Financial Resource Strain: Low Risk   . Difficulty of Paying Living Expenses: Not hard at all  Food Insecurity: No Food Insecurity  . Worried About Charity fundraiser in the Last Year: Never true  . Ran Out of Food in the Last Year: Never true  Transportation Needs: No Transportation Needs  . Lack of Transportation (Medical): No  . Lack of Transportation (Non-Medical): No  Physical Activity: Insufficiently Active  . Days of Exercise per Week: 5 days  . Minutes of Exercise per Session: 10 min  Stress: No Stress Concern Present  . Feeling of Stress : Not at all  Social Connections: Moderately Isolated  . Frequency of Communication with Friends and Family: More than three times a week  . Frequency of Social Gatherings with Friends and Family: More than three times a week  . Attends Religious Services: Never  . Active Member of Clubs or Organizations: No  . Attends Archivist Meetings: Never  . Marital Status: Married  Human resources officer Violence: Not At Risk  . Fear of Current or Ex-Partner: No  . Emotionally Abused: No  . Physically Abused: No  . Sexually Abused: No    Outpatient Medications Prior to Visit  Medication Sig Dispense Refill  . benazepril (LOTENSIN) 10 MG tablet TAKE 1 TABLET BY MOUTH EVERY DAY 90 tablet 1  . blood glucose meter kit and supplies Dispense based on patient and insurance preference. Use up to three times daily as directed. (dx E11.65). 1 each 0  . Calcium Carbonate (CALCIUM 600 PO) Take 1 tablet by mouth daily.    . Cholecalciferol (VITAMIN D3 PO) Take 4,000 Units by mouth daily.    Marland Kitchen glipiZIDE (GLUCOTROL XL) 2.5 MG 24 hr tablet Take 1 tablet (2.5 mg total) by mouth daily with breakfast. 90 tablet 3  . glucose blood (ONETOUCH VERIO) test strip USE as instrusted UP TO four times daily 1 each 2  . Iron, Ferrous Sulfate, 325 (65 Fe) MG TABS Take one tablet by  mouth once daily 90 tablet 1  . latanoprost (XALATAN) 0.005 % ophthalmic solution INSTILL ONE DROP IN EACH EYE AT BEDTIME    . linagliptin (TRADJENTA) 5 MG TABS tablet Take 1 tablet (5 mg total) by mouth daily. 90 tablet 1  . pantoprazole (PROTONIX) 40 MG tablet TAKE 1 TABLET BY MOUTH EVERY DAY 30 tablet 3  . rosuvastatin (CRESTOR) 5 MG tablet Take one tablet by mouth every Monday , Wednesday and Friday 36 tablet 1  . timolol (TIMOPTIC) 0.5 % ophthalmic solution INSTILL ONE DROP IN THE RIGHT EYE EVERY MORNING     No facility-administered  medications prior to visit.    Allergies  Allergen Reactions  . No Known Allergies     ROS Review of Systems  Constitutional: Negative for chills and fever.  HENT: Negative for congestion and sore throat.   Eyes: Negative for pain and discharge.  Respiratory: Negative for cough and shortness of breath.   Cardiovascular: Negative for chest pain and palpitations.  Gastrointestinal: Negative for constipation, diarrhea, nausea and vomiting.       Dark stools  Endocrine: Negative for polydipsia and polyuria.  Genitourinary: Negative for dysuria and hematuria.  Musculoskeletal: Negative for neck pain and neck stiffness.  Skin: Negative for rash.  Neurological: Negative for dizziness, weakness, numbness and headaches.  Psychiatric/Behavioral: Negative for agitation and behavioral problems.      Objective:    Physical Exam Vitals reviewed.  Constitutional:      General: He is not in acute distress.    Appearance: He is not diaphoretic.  HENT:     Head: Normocephalic and atraumatic.     Nose: Nose normal.     Mouth/Throat:     Mouth: Mucous membranes are moist.  Eyes:     General: No scleral icterus.    Extraocular Movements: Extraocular movements intact.     Pupils: Pupils are equal, round, and reactive to light.  Cardiovascular:     Rate and Rhythm: Normal rate and regular rhythm.     Heart sounds: No murmur heard.   Pulmonary:      Breath sounds: Normal breath sounds. No wheezing or rales.  Abdominal:     Palpations: Abdomen is soft.     Tenderness: There is no abdominal tenderness. There is no guarding or rebound.  Genitourinary:    Rectum: Normal. Guaiac result negative.  Musculoskeletal:     Cervical back: Neck supple. No tenderness.     Right lower leg: No edema.     Left lower leg: No edema.  Skin:    General: Skin is warm.     Findings: No rash.  Neurological:     General: No focal deficit present.     Mental Status: He is alert and oriented to person, place, and time.     Sensory: No sensory deficit.     Motor: No weakness.  Psychiatric:        Mood and Affect: Mood normal.        Behavior: Behavior normal.     BP (!) 178/95 (BP Location: Right Arm, Patient Position: Sitting, Cuff Size: Normal)   Pulse 67   Resp 18   Ht '5\' 5"'  (1.651 m)   Wt 124 lb 1.6 oz (56.3 kg)   SpO2 97%   BMI 20.65 kg/m  Wt Readings from Last 3 Encounters:  06/16/20 124 lb 1.6 oz (56.3 kg)  05/05/20 121 lb (54.9 kg)  04/06/20 119 lb (54 kg)     Health Maintenance Due  Topic Date Due  . OPHTHALMOLOGY EXAM  10/09/2017  . TETANUS/TDAP  12/22/2018  . FOOT EXAM  02/27/2020    There are no preventive care reminders to display for this patient.  Lab Results  Component Value Date   TSH 1.300 03/31/2020   Lab Results  Component Value Date   WBC 4.2 09/30/2019   HGB 12.2 (L) 09/30/2019   HCT 36.4 (L) 09/30/2019   MCV 94.3 09/30/2019   PLT 118 (L) 09/30/2019   Lab Results  Component Value Date   NA 139 03/31/2020   K 4.5 03/31/2020   CO2  26 03/31/2020   GLUCOSE 187 (H) 03/31/2020   BUN 12 03/31/2020   CREATININE 1.31 (H) 03/31/2020   BILITOT 0.4 03/31/2020   ALKPHOS 109 03/31/2020   AST 16 03/31/2020   ALT 8 03/31/2020   PROT 8.6 (H) 03/31/2020   ALBUMIN 4.3 03/31/2020   CALCIUM 9.4 03/31/2020   ANIONGAP 7 10/10/2016   Lab Results  Component Value Date   CHOL 114 09/30/2019   Lab Results   Component Value Date   HDL 44 09/30/2019   Lab Results  Component Value Date   LDLCALC 58 09/30/2019   Lab Results  Component Value Date   TRIG 42 09/30/2019   Lab Results  Component Value Date   CHOLHDL 2.6 09/30/2019   Lab Results  Component Value Date   HGBA1C 10.5 (A) 04/06/2020      Assessment & Plan:   Problem List Items Addressed This Visit      Cardiovascular and Mediastinum   Essential hypertension, benign BP Readings from Last 1 Encounters:  06/16/20 (!) 156/88   Elevated today as patient has not taken his medication today Counseled for compliance with the medications Advised DASH diet and moderate exercise/walking, at least 150 mins/week     Other Visit Diagnoses    Dark stools    -  Primary Heme occult negative in the office Dark stools can be due to iron supplements Continue Protonix    Constipation, unspecified constipation type  Colace PRN Increase water intake and dietary fibre intake      Relevant Medications   docusate sodium (COLACE) 100 MG capsule      Meds ordered this encounter  Medications  . DISCONTD: docusate sodium (COLACE) 100 MG capsule    Sig: Take 1 capsule (100 mg total) by mouth 2 (two) times daily.    Dispense:  10 capsule    Refill:  0  . docusate sodium (COLACE) 100 MG capsule    Sig: Take 1 capsule (100 mg total) by mouth daily as needed for mild constipation.    Dispense:  90 capsule    Refill:  1    Follow-up: Return if symptoms worsen or fail to improve.    Lindell Spar, MD

## 2020-06-17 ENCOUNTER — Encounter: Payer: Self-pay | Admitting: Family Medicine

## 2020-06-18 ENCOUNTER — Encounter: Payer: Self-pay | Admitting: Family Medicine

## 2020-06-18 DIAGNOSIS — K529 Noninfective gastroenteritis and colitis, unspecified: Secondary | ICD-10-CM | POA: Insufficient documentation

## 2020-06-18 DIAGNOSIS — K921 Melena: Secondary | ICD-10-CM | POA: Insufficient documentation

## 2020-06-18 NOTE — Assessment & Plan Note (Signed)
Jacob Barnes is reminded of the importance of commitment to daily physical activity for 30 minutes or more, as able and the need to limit carbohydrate intake to 30 to 60 grams per meal to help with blood sugar control.   The need to take medication as prescribed, test blood sugar as directed, and to call between visits if there is a concern that blood sugar is uncontrolled is also discussed.   Jacob Barnes is reminded of the importance of daily foot exam, annual eye examination, and good blood sugar, blood pressure and cholesterol control.  Diabetic Labs Latest Ref Rng & Units 04/06/2020 03/31/2020 01/07/2020 09/30/2019 06/24/2019  HbA1c 0.0 - 7.0 % 10.5(A) - 7.7(A) 9.4(H) 8.6(H)  Microalbumin mg/dL - - - - -  Micro/Creat Ratio <30 mcg/mg creat - - - - -  Chol <200 mg/dL - - - 114 -  HDL > OR = 40 mg/dL - - - 44 -  Calc LDL mg/dL (calc) - - - 58 -  Triglycerides <150 mg/dL - - - 42 -  Creatinine 0.76 - 1.27 mg/dL - 1.31(H) - 1.36(H) 1.18   BP/Weight 06/16/2020 05/05/2020 04/06/2020 04/06/2020 02/11/2020 01/28/2020 07/07/5186  Systolic BP 416 606 301 601 - 093 235  Diastolic BP 88 73 72 70 - 72 74  Wt. (Lbs) 124.1 121 119 119 124 122.2 123.08  BMI 20.65 20.14 19.8 19.8 20.63 20.34 20.48   Foot/eye exam completion dates Latest Ref Rng & Units 02/27/2019 01/21/2018  Eye Exam No Retinopathy - -  Foot Form Completion - Done Done      Updated lab needed at/ before next visit. Advised pt that glipozide does not cause loose stool and he needs to resume this. No change in meds, at risk for hypoglycemia due to poor eating habits

## 2020-06-18 NOTE — Assessment & Plan Note (Signed)
DASH diet and commitment to daily physical activity for a minimum of 30 minutes discussed and encouraged, as a part of hypertension management. The importance of attaining a healthy weight is also discussed.  BP/Weight 06/16/2020 05/05/2020 04/06/2020 04/06/2020 02/11/2020 01/28/2020 05/14/5595  Systolic BP 416 384 536 468 - 032 122  Diastolic BP 88 73 72 70 - 72 74  Wt. (Lbs) 124.1 121 119 119 124 122.2 123.08  BMI 20.65 20.14 19.8 19.8 20.63 20.34 20.48

## 2020-06-18 NOTE — Assessment & Plan Note (Signed)
4 week history, mainly at night, with black stool and weakness, needs urgent eval to r/o GI bleed

## 2020-07-05 ENCOUNTER — Ambulatory Visit: Payer: Medicare HMO | Admitting: Nurse Practitioner

## 2020-07-08 ENCOUNTER — Ambulatory Visit: Payer: Medicare HMO | Admitting: Nurse Practitioner

## 2020-07-08 NOTE — Patient Instructions (Incomplete)
Diabetes Mellitus and Nutrition, Adult When you have diabetes, or diabetes mellitus, it is very important to have healthy eating habits because your blood sugar (glucose) levels are greatly affected by what you eat and drink. Eating healthy foods in the right amounts, at about the same times every day, can help you:  Control your blood glucose.  Lower your risk of heart disease.  Improve your blood pressure.  Reach or maintain a healthy weight. What can affect my meal plan? Every person with diabetes is different, and each person has different needs for a meal plan. Your health care provider may recommend that you work with a dietitian to make a meal plan that is best for you. Your meal plan may vary depending on factors such as:  The calories you need.  The medicines you take.  Your weight.  Your blood glucose, blood pressure, and cholesterol levels.  Your activity level.  Other health conditions you have, such as heart or kidney disease. How do carbohydrates affect me? Carbohydrates, also called carbs, affect your blood glucose level more than any other type of food. Eating carbs naturally raises the amount of glucose in your blood. Carb counting is a method for keeping track of how many carbs you eat. Counting carbs is important to keep your blood glucose at a healthy level, especially if you use insulin or take certain oral diabetes medicines. It is important to know how many carbs you can safely have in each meal. This is different for every person. Your dietitian can help you calculate how many carbs you should have at each meal and for each snack. How does alcohol affect me? Alcohol can cause a sudden decrease in blood glucose (hypoglycemia), especially if you use insulin or take certain oral diabetes medicines. Hypoglycemia can be a life-threatening condition. Symptoms of hypoglycemia, such as sleepiness, dizziness, and confusion, are similar to symptoms of having too much  alcohol.  Do not drink alcohol if: ? Your health care provider tells you not to drink. ? You are pregnant, may be pregnant, or are planning to become pregnant.  If you drink alcohol: ? Do not drink on an empty stomach. ? Limit how much you use to:  0-1 drink a day for women.  0-2 drinks a day for men. ? Be aware of how much alcohol is in your drink. In the [place] one drink equals one 12 oz bottle of beer (355 mL), one 5 oz glass of wine (148 mL), or one 1 oz glass of hard liquor (44 mL). ? Keep yourself hydrated with water, diet soda, or unsweetened iced tea.  Keep in mind that regular soda, juice, and other mixers may contain a lot of sugar and must be counted as carbs. What are tips for following this plan? Reading food labels  Start by checking the serving size on the "Nutrition Facts" label of packaged foods and drinks. The amount of calories, carbs, fats, and other nutrients listed on the label is based on one serving of the item. Many items contain more than one serving per package.  Check the total grams (g) of carbs in one serving. You can calculate the number of servings of carbs in one serving by dividing the total carbs by 15. For example, if a food has 30 g of total carbs per serving, it would be equal to 2 servings of carbs.  Check the number of grams (g) of saturated fats and trans fats in one serving. Choose foods that have   a low amount or none of these fats.  Check the number of milligrams (mg) of salt (sodium) in one serving. Most people should limit total sodium intake to less than 2,300 mg per day.  Always check the nutrition information of foods labeled as "low-fat" or "nonfat." These foods may be higher in added sugar or refined carbs and should be avoided.  Talk to your dietitian to identify your daily goals for nutrients listed on the label. Shopping  Avoid buying canned, pre-made, or processed foods. These foods tend to be high in fat, sodium, and added  sugar.  Shop around the outside edge of the grocery store. This is where you will most often find fresh fruits and vegetables, bulk grains, fresh meats, and fresh dairy. Cooking  Use low-heat cooking methods, such as baking, instead of high-heat cooking methods like deep frying.  Cook using healthy oils, such as olive, canola, or sunflower oil.  Avoid cooking with butter, cream, or high-fat meats. Meal planning  Eat meals and snacks regularly, preferably at the same times every day. Avoid going long periods of time without eating.  Eat foods that are high in fiber, such as fresh fruits, vegetables, beans, and whole grains. Talk with your dietitian about how many servings of carbs you can eat at each meal.  Eat 4-6 oz (112-168 g) of lean protein each day, such as lean meat, chicken, fish, eggs, or tofu. One ounce (oz) of lean protein is equal to: ? 1 oz (28 g) of meat, chicken, or fish. ? 1 egg. ?  cup (62 g) of tofu.  Eat some foods each day that contain healthy fats, such as avocado, nuts, seeds, and fish.   What foods should I eat? Fruits Berries. Apples. Oranges. Peaches. Apricots. Plums. Grapes. Mango. Papaya. Pomegranate. Kiwi. Cherries. Vegetables Lettuce. Spinach. Leafy greens, including kale, chard, collard greens, and mustard greens. Beets. Cauliflower. Cabbage. Broccoli. Carrots. Green beans. Tomatoes. Peppers. Onions. Cucumbers. Brussels sprouts. Grains Whole grains, such as whole-wheat or whole-grain bread, crackers, tortillas, cereal, and pasta. Unsweetened oatmeal. Quinoa. Brown or wild [place]. Meats and other proteins Seafood. Poultry without skin. Lean cuts of poultry and beef. Tofu. Nuts. Seeds. Dairy Low-fat or fat-free dairy products such as milk, yogurt, and cheese. The items listed above may not be a complete list of foods and beverages you can eat. Contact a dietitian for more information. What foods should I avoid? Fruits Fruits canned with  syrup. Vegetables Canned vegetables. Frozen vegetables with butter or cream sauce. Grains Refined white flour and flour products such as bread, pasta, snack foods, and cereals. Avoid all processed foods. Meats and other proteins Fatty cuts of meat. Poultry with skin. Breaded or fried meats. Processed meat. Avoid saturated fats. Dairy Full-fat yogurt, cheese, or milk. Beverages Sweetened drinks, such as soda or iced tea. The items listed above may not be a complete list of foods and beverages you should avoid. Contact a dietitian for more information. Questions to ask a health care provider  Do I need to meet with a diabetes educator?  Do I need to meet with a dietitian?  What number can I call if I have questions?  When are the best times to check my blood glucose? Where to find more information:  American Diabetes Association: diabetes.org  Academy of Nutrition and Dietetics: www.eatright.org  National Institute of Diabetes and Digestive and Kidney Diseases: www.niddk.nih.gov  Association of Diabetes Care and Education Specialists: www.diabeteseducator.org Summary  It is important to have healthy eating   habits because your blood sugar (glucose) levels are greatly affected by what you eat and drink.  A healthy meal plan will help you control your blood glucose and maintain a healthy lifestyle.  Your health care provider may recommend that you work with a dietitian to make a meal plan that is best for you.  Keep in mind that carbohydrates (carbs) and alcohol have immediate effects on your blood glucose levels. It is important to count carbs and to use alcohol carefully. This information is not intended to replace advice given to you by your health care provider. Make sure you discuss any questions you have with your health care provider. Document Revised: 03/04/2019 Document Reviewed: 03/04/2019 Elsevier Patient Education  2021 [employer].  

## 2020-07-13 ENCOUNTER — Encounter: Payer: Self-pay | Admitting: Nurse Practitioner

## 2020-07-14 ENCOUNTER — Ambulatory Visit: Payer: Medicare HMO | Admitting: Nurse Practitioner

## 2020-07-14 ENCOUNTER — Encounter: Payer: Self-pay | Admitting: Nurse Practitioner

## 2020-07-14 ENCOUNTER — Other Ambulatory Visit: Payer: Self-pay

## 2020-07-14 ENCOUNTER — Ambulatory Visit: Payer: Medicare HMO | Admitting: Family Medicine

## 2020-07-14 VITALS — BP 144/76 | HR 82 | Ht 65.0 in | Wt 119.6 lb

## 2020-07-14 DIAGNOSIS — E1165 Type 2 diabetes mellitus with hyperglycemia: Secondary | ICD-10-CM | POA: Diagnosis not present

## 2020-07-14 DIAGNOSIS — I10 Essential (primary) hypertension: Secondary | ICD-10-CM

## 2020-07-14 DIAGNOSIS — M8580 Other specified disorders of bone density and structure, unspecified site: Secondary | ICD-10-CM | POA: Diagnosis not present

## 2020-07-14 DIAGNOSIS — E782 Mixed hyperlipidemia: Secondary | ICD-10-CM | POA: Diagnosis not present

## 2020-07-14 LAB — POCT UA - MICROALBUMIN: Microalbumin Ur, POC: 150 mg/L

## 2020-07-14 LAB — POCT GLYCOSYLATED HEMOGLOBIN (HGB A1C): HbA1c, POC (controlled diabetic range): 10.2 % — AB (ref 0.0–7.0)

## 2020-07-14 MED ORDER — TRESIBA FLEXTOUCH 100 UNIT/ML ~~LOC~~ SOPN: 10.0000 [IU] | PEN_INJECTOR | Freq: Every day | SUBCUTANEOUS | 3 refills | Status: DC

## 2020-07-14 NOTE — Patient Instructions (Signed)

## 2020-07-14 NOTE — Progress Notes (Signed)
Endocrinology Follow Up Visit       07/14/2020, 1:59 PM   Subjective:    Patient ID: KEO SCHIRMER, male    DOB: [birth year].  Addie TAESEAN RETH is being seen in follow up after being seen in consultation for management of currently uncontrolled symptomatic diabetes requested by  Fayrene Helper, MD.   Past Medical History:  Diagnosis Date  . Abrasion of right middle finger with infection    for 1 week,   . 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 Anemia   . Arthritis    hands  . Carpal tunnel syndrome 08/05/2009   Qualifier: Diagnosis of  By: Aline Brochure MD, Dorothyann Peng    . Chronic kidney disease   . Diabetes mellitus    says since 1979 type 2  . Diabetes mellitus without complication ([place])    Phreesia 04/03/2020  . ERECTILE DYSFUNCTION, ORGANIC 01/20/2009   Qualifier: Diagnosis of  By: Moshe Cipro MD, Joycelyn Schmid    . GERD (gastroesophageal reflux disease)   . Glaucoma    both eyes  . History of kidney stones   . Hyperlipemia   . Hypertension   . Iron deficiency anemia 01/10/2014  . Other pancytopenia ([place]) 01/10/2014   New in 01/2014, undergoing pathology review   . Rash    front abdomen no drainage    Past Surgical History:  Procedure Laterality Date  . AMPUTATION Right 09/01/2016   Procedure: AMPUTATION RIGHT MIDDLE FINGER TIP;  Surgeon: Mcarthur Rossetti, MD;  Location: [facility];  Service: Orthopedics;  Laterality: Right;  . AMPUTATION Right 10/10/2016   Procedure: REPEAT IRRIGATION AND DEBRIDEMENT RIGHT MIDDLE FINGER WITH AMPUTATION THROUGH PROXIMAL PHALANX;  Surgeon: Mcarthur Rossetti, MD;  Location: [facility];  Service: Orthopedics;  Laterality: Right;  . APPENDECTOMY    . COLONOSCOPY  09/06/2011   Procedure: COLONOSCOPY;  Surgeon: Rogene Houston, MD;  Location: [facility];  Service: Endoscopy;  Laterality: N/A;  830  . CYSTOSCOPY/RETROGRADE/URETEROSCOPY/STONE EXTRACTION WITH BASKET    .  ESOPHAGOGASTRODUODENOSCOPY N/A 01/15/2014   Procedure: ESOPHAGOGASTRODUODENOSCOPY (EGD);  Surgeon: Rogene Houston, MD;  Location: [facility];  Service: Endoscopy;  Laterality: N/A;  200  . ESOPHAGOGASTRODUODENOSCOPY N/A 04/22/2014   Procedure: ESOPHAGOGASTRODUODENOSCOPY (EGD);  Surgeon: Rogene Houston, MD;  Location: [facility];  Service: Endoscopy;  Laterality: N/A;  240  . EYE SURGERY Bilateral yrs ago   ioc for cataract  . MALONEY DILATION N/A 01/15/2014   Procedure: MALONEY DILATION;  Surgeon: Rogene Houston, MD;  Location: [facility];  Service: Endoscopy;  Laterality: N/A;  . MALONEY DILATION  04/22/2014   Procedure: Venia Minks DILATION;  Surgeon: Rogene Houston, MD;  Location: [facility];  Service: Endoscopy;;  . surgery for left eye bleedf  2017    Social History   Socioeconomic History  . Marital status: Married    Spouse name: Not on file  . Number of children: Not on file  . Years of education: Not on file  . Highest education level: Not on file  Occupational History  . Not on file  Tobacco Use  . Smoking status: Never Smoker  . Smokeless tobacco: Never Used  Vaping Use  . Vaping Use: Never used  Substance and Sexual Activity  . Alcohol use: No  . Drug use: No  . Sexual activity: Yes  Other Topics Concern  . Not on file  Social History Narrative  . Not on file   Social Determinants of Health   Financial Resource Strain: Low Risk   . Difficulty of Paying Living Expenses: Not hard at all  Food Insecurity: No Food Insecurity  . Worried About Charity fundraiser in the Last Year: Never true  . Ran Out of Food in the Last Year: Never true  Transportation Needs: No Transportation Needs  . Lack of Transportation (Medical): No  . Lack of Transportation (Non-Medical): No  Physical Activity: Insufficiently Active  . Days of Exercise per Week: 5 days  . Minutes of Exercise per Session: 10 min  Stress: No Stress Concern Present  . Feeling of Stress : Not at  all  Social Connections: Moderately Isolated  . Frequency of Communication with Friends and Family: More than three times a week  . Frequency of Social Gatherings with Friends and Family: More than three times a week  . Attends Religious Services: Never  . Active Member of Clubs or Organizations: No  . Attends Archivist Meetings: Never  . Marital Status: Married    Family History  Problem Relation Age of Onset  . Diabetes Mother   . Hypertension Mother   . Hyperlipidemia Mother   . Stroke Mother   . Heart failure Mother   . Diabetes Father   . Heart disease Father   . Hypertension Father   . Hyperlipidemia Father   . Heart attack Father   . Diabetes Brother   . Diabetes Brother   . Diabetes Son     Outpatient Encounter Medications as of 07/14/2020  Medication Sig  . benazepril (LOTENSIN) 10 MG tablet TAKE 1 TABLET BY MOUTH EVERY DAY  . blood glucose meter kit and supplies Dispense based on patient and insurance preference. Use up to three times daily as directed. (dx E11.65).  . Calcium Carbonate (CALCIUM 600 PO) Take 1 tablet by mouth daily.  . Cholecalciferol (VITAMIN D3 PO) Take 4,000 Units by mouth daily.  Marland Kitchen docusate sodium (COLACE) 100 MG capsule Take 1 capsule (100 mg total) by mouth daily as needed for mild constipation.  . dorzolamide-timolol (COSOPT) 22.3-6.8 MG/ML ophthalmic solution 1 drop 2 (two) times daily.  Marland Kitchen glucose blood (ONETOUCH VERIO) test strip USE as instrusted UP TO four times daily  . Iron, Ferrous Sulfate, 325 (65 Fe) MG TABS Take one tablet by mouth once daily  . linagliptin (TRADJENTA) 5 MG TABS tablet Take 1 tablet (5 mg total) by mouth daily.  . pantoprazole (PROTONIX) 40 MG tablet TAKE 1 TABLET BY MOUTH EVERY DAY  . rosuvastatin (CRESTOR) 5 MG tablet Take one tablet by mouth every Monday , Wednesday and Friday  . glipiZIDE (GLUCOTROL XL) 2.5 MG 24 hr tablet Take 1 tablet (2.5 mg total) by mouth daily with breakfast. (Patient not taking:  Reported on 07/14/2020)  . latanoprost (XALATAN) 0.005 % ophthalmic solution INSTILL ONE DROP IN [facility] EYE AT BEDTIME (Patient not taking: Reported on 07/14/2020)  . [DISCONTINUED] timolol (TIMOPTIC) 0.5 % ophthalmic solution INSTILL ONE DROP IN THE RIGHT EYE EVERY MORNING   No facility-administered encounter medications on file as of 07/14/2020.    ALLERGIES: Allergies  Allergen Reactions  . No Known Allergies     VACCINATION STATUS: Immunization History  Administered Date(s) Administered  . Fluad Quad(high Dose 65+) 02/27/2019  . Influenza,inj,Quad PF,6+ Mos 03/17/2013, 03/17/2014, 03/18/2015, 01/04/2016, 12/05/2016, 12/02/2019  . Moderna Sars-Covid-2 Vaccination 06/23/2019, 07/21/2019  . PPD Test 08/16/2010, 08/18/2010, 11/03/2013  . Pneumococcal Conjugate-13 11/03/2013  . Pneumococcal Polysaccharide-23 12/21/2008  . Td 12/21/2008    Diabetes He presents for his follow-up diabetic visit. He has type 2 diabetes mellitus. Onset time: He was diagnosed at approximate age of 46 years. His disease course has been stable. There are no hypoglycemic associated symptoms. Pertinent negatives for hypoglycemia include no confusion, headaches, pallor or seizures. Associated symptoms include blurred vision, fatigue and polyuria. Pertinent negatives for diabetes include no chest pain, no polydipsia, no polyphagia and no weakness. There are no hypoglycemic complications. Symptoms are stable. Diabetic complications include impotence, nephropathy, peripheral neuropathy and PVD. Risk factors for coronary artery disease include diabetes mellitus, dyslipidemia, hypertension, male sex and sedentary lifestyle. Current diabetic treatment includes oral agent (dual therapy) (stopped taking his Glipizide about a week ago due to diarrhea). He is compliant with treatment most of the time. His weight is decreasing steadily. He is following a generally healthy diet. When asked about meal planning, he reported none. He has not  had a previous visit with a dietitian. He never participates in exercise. His home blood glucose trend is fluctuating minimally. (He presents today with no meter or logs to review.  His POCT A1c today is 10.2%, slightly better than last visit of 10.5%.  He reports he stopped taking his Glipizide 2.5 mg about a week ago due to uncontrollable diarrhea.  It is keeping him from leaving the house.  He denies any recent hypoglycemia.) An ACE inhibitor/angiotensin II receptor blocker is being taken. He does not see a podiatrist.Eye exam is current.  Hyperlipidemia This is a chronic problem. The current episode started more than 1 year ago. The problem is controlled. Recent lipid tests were reviewed and are normal. Exacerbating diseases include chronic renal disease and diabetes. There are no known factors aggravating his hyperlipidemia. Pertinent negatives include no chest pain, myalgias or shortness of breath. Current antihyperlipidemic treatment includes statins. The current treatment provides moderate improvement of lipids. There are no compliance problems.  Risk factors for coronary artery disease include dyslipidemia, diabetes mellitus, hypertension, male sex, a sedentary lifestyle and family history.  Hypertension This is a chronic problem. The current episode started more than 1 year ago. The problem has been gradually improving since onset. The problem is controlled. Associated symptoms include blurred vision. Pertinent negatives include no chest pain, headaches, neck pain, palpitations or shortness of breath. There are no associated agents to hypertension. Risk factors for coronary artery disease include diabetes mellitus, dyslipidemia, male gender and sedentary lifestyle. Past treatments include ACE inhibitors. The current treatment provides moderate improvement. There are no compliance problems.  Hypertensive end-organ damage includes kidney disease and PVD. Identifiable causes of hypertension include  chronic renal disease.    Review of systems  Constitutional: + Minimally fluctuating body weight,  current Body mass index is 19.9 kg/m. , no fatigue, no subjective hyperthermia, no subjective hypothermia Eyes: no blurry vision, no xerophthalmia ENT: no sore throat, no nodules palpated in throat, no dysphagia/odynophagia, no hoarseness Cardiovascular: no chest pain, no shortness of breath, no palpitations, no leg swelling Respiratory: no cough, no shortness of breath Gastrointestinal: no nausea/vomiting/diarrhea Musculoskeletal: no muscle/joint aches, multiple falls, walks with cane for disequilibrium and deconditioning Skin: no rashes, no hyperemia Neurological: no tremors, no numbness, no tingling, no dizziness Psychiatric: no  depression, no anxiety   Objective:    BP (!) 144/76 (BP Location: Left Arm, Patient Position: Sitting)   Pulse 82   Ht '5\' 5"'  (1.651 m)   Wt 119 lb 9.6 oz (54.3 kg)   BMI 19.90 kg/m   Wt Readings from Last 3 Encounters:  07/14/20 119 lb 9.6 oz (54.3 kg)  06/16/20 124 lb 1.6 oz (56.3 kg)  05/05/20 121 lb (54.9 kg)    BP Readings from Last 3 Encounters:  07/14/20 (!) 144/76  06/16/20 (!) 156/88  05/05/20 138/73    Physical Exam- Limited  Constitutional:  Body mass index is 19.9 kg/m. , not in acute distress, normal state of mind Eyes:  EOMI, no exophthalmos Neck: Supple Cardiovascular: RRR, no murmers, rubs, or gallops, no edema Respiratory: Adequate breathing efforts, no crackles, rales, rhonchi, or wheezing Musculoskeletal: no gross deformities, strength intact in all four extremities, no gross restriction of joint movements Skin:  no rashes, no hyperemia Neurological: no tremor with outstretched hands   CMP ( most recent) CMP     Component Value Date/Time   NA 139 03/31/2020 0848   K 4.5 03/31/2020 0848   CL 101 03/31/2020 0848   CO2 26 03/31/2020 0848   GLUCOSE 187 (H) 03/31/2020 0848   GLUCOSE 101 (H) 09/30/2019 1008   BUN 12  03/31/2020 0848   CREATININE 1.31 (H) 03/31/2020 0848   CREATININE 1.36 (H) 09/30/2019 1008   CALCIUM 9.4 03/31/2020 0848   PROT 8.6 (H) 03/31/2020 0848   ALBUMIN 4.3 03/31/2020 0848   AST 16 03/31/2020 0848   ALT 8 03/31/2020 0848   ALKPHOS 109 03/31/2020 0848   BILITOT 0.4 03/31/2020 0848   GFRNONAA 51 (L) 03/31/2020 0848   GFRNONAA 49 (L) 09/30/2019 1008   GFRAA 59 (L) 03/31/2020 0848   GFRAA 57 (L) 09/30/2019 1008     Diabetic Labs (most recent): Lab Results  Component Value Date   HGBA1C 10.5 (A) 04/06/2020   HGBA1C 7.7 (A) 01/07/2020   HGBA1C 9.4 (H) 09/30/2019     Lipid Panel ( most recent) Lipid Panel     Component Value Date/Time   CHOL 114 09/30/2019 1008   TRIG 42 09/30/2019 1008   HDL 44 09/30/2019 1008   CHOLHDL 2.6 09/30/2019 1008   VLDL 7 11/27/2016 1000   LDLCALC 58 09/30/2019 1008      Lab Results  Component Value Date   TSH 1.300 03/31/2020   TSH 1.77 09/23/2018   TSH 0.61 07/19/2015   TSH 0.952 03/18/2015   TSH 0.921 12/11/2013   TSH 1.392 10/29/2012   TSH 0.615 09/26/2011   TSH 1.573 12/21/2008   FREET4 1.26 03/31/2020      Assessment & Plan:   1) Uncontrolled type 2 diabetes mellitus with hyperglycemia (HCC)  - Kayla BRIGHAM COBBINS has currently uncontrolled symptomatic type 2 DM since  80 years of age.  He presents today with no meter or logs to review.  His POCT A1c today is 10.2%, slightly better than last visit of 10.5%.  He reports he stopped taking his Glipizide 2.5 mg about a week ago due to uncontrollable diarrhea.  It is keeping him from leaving the house.  He denies any recent hypoglycemia.   Recent labs reviewed.  - I had a long discussion with him about the progressive nature of diabetes and the pathology behind its complications. -his diabetes is complicated by peripheral arterial disease and he remains at a high risk for more acute and chronic complications  which include CAD, CVA, CKD, retinopathy, and neuropathy. These are all  discussed in detail with him.  - Nutritional counseling repeated at each appointment due to patients tendency to fall back in to old habits.  - The patient admits there is a room for improvement in their diet and drink choices. -  Suggestion is made for the patient to avoid simple carbohydrates from their diet including Cakes, Sweet Desserts / Pastries, Ice Cream, Soda (diet and regular), Sweet Tea, Candies, Chips, Cookies, Sweet Pastries,  Store Bought Juices, Alcohol in Excess of  1-2 drinks a day, Artificial Sweeteners, Coffee Creamer, and "Sugar-free" Products. This will help patient to have stable blood glucose profile and potentially avoid unintended weight gain.   - I encouraged the patient to switch to  unprocessed or minimally processed complex starch and increased protein intake (animal or plant source), fruits, and vegetables.   - Patient is advised to stick to a routine mealtimes to eat 3 meals  a day and avoid unnecessary snacks ( to snack only to correct hypoglycemia).  - I have approached him with the following individualized plan to manage  his diabetes and patient agrees:   -In light of his advanced age and light body weight, avoiding hypoglycemia should be the number 1 priority in his care.   -Given his continued loss of control and inability to tolerate even the lowest dose of Glipizide, I discussed and re-initiated basal insulin treatment which he has been on previously and had good success with.  He is advised to restart Tresiba 10 units SQ nightly and continue Tradjenta 5 mg po daily.  Stop the Glipizide for now.    -He is encouraged to continue monitoring blood glucose at least once daily, before breakfast, and to call the clinic if he has readings less than 70 or greater than 300 for 3 tests in a row.  - he is not a candidate for Metformin due to concurrent renal insufficiency.  - he is not a candidate for incretin therapy due to his body habitus.   - Specific targets  for  A1c;  LDL, HDL,  and Triglycerides were discussed with the patient.  2) Blood Pressure /Hypertension:   his blood pressure is controlled to target for his age.   he is advised to continue his current medications including Benazepril 10 mg p.o. daily with breakfast .  3) Lipids/Hyperlipidemia:    His most recent lipid panel from 09/30/19 shows controlled LDL at 58.  He is advised to continue Crestor 5 mg po daily at bedtime.  Side effects and precautions discussed with him.  4)  Weight/Diet:  His Body mass index is 19.9 kg/m.   he is not a candidate for weight loss.   Exercise, and detailed carbohydrates information provided  -  detailed on discharge instructions.  5) Osteopenia: -He will not need bisphosphonate intervention at this time.  He is advised to continue on his vitamin D 4000 units daily, as well as calcium carbonate 600 mg p.o. daily.  6) Chronic Care/Health Maintenance: -he is on ACEI/ARB and Statin medications and is encouraged to initiate and continue to follow up with Ophthalmology, Dentist,  Podiatrist at least yearly or according to recommendations, and advised to stay away from smoking. I have recommended yearly flu vaccine and pneumonia vaccine at least every 5 years; moderate intensity exercise for up to 150 minutes weekly; and  sleep for at least 7 hours a day.  - he is  advised to maintain close  follow up with Fayrene Helper, MD for primary care needs, as well as his other providers for optimal and coordinated care.   - Time spent on this patient care encounter:  40 min, of which > 50% was spent in  counseling and the rest reviewing his blood glucose logs , discussing his hypoglycemia and hyperglycemia episodes, reviewing his current and  previous labs / studies  ( including abstraction from other facilities) and medications  doses and developing a  long term treatment plan and documenting his care.   Please refer to Patient Instructions for Blood Glucose  Monitoring and Insulin/Medications Dosing Guide"  in media tab for additional information. Please  also refer to " Patient Self Inventory" in the Media  tab for reviewed elements of pertinent patient history.  Rhett Bannister participated in the discussions, expressed understanding, and voiced agreement with the above plans.  All questions were answered to his satisfaction. he is encouraged to contact clinic should he have any questions or concerns prior to his return visit.   Follow up plan: - Return in about 6 months (around 01/13/2021) for Diabetes follow up with A1c in office, Previsit labs, Bring glucometer and logs.  Rayetta Pigg, [facility] [address] [place], [place] [address] Phone: [phone] Fax: [phone]  07/14/2020, 1:59 PM

## 2020-07-19 NOTE — Telephone Encounter (Signed)
We can try to get him approved, however I fear that he will not.

## 2020-08-03 ENCOUNTER — Encounter: Payer: Self-pay | Admitting: Nurse Practitioner

## 2020-08-03 ENCOUNTER — Other Ambulatory Visit: Payer: Self-pay

## 2020-08-03 ENCOUNTER — Ambulatory Visit (INDEPENDENT_AMBULATORY_CARE_PROVIDER_SITE_OTHER): Payer: Medicare HMO | Admitting: Nurse Practitioner

## 2020-08-03 VITALS — BP 163/70 | HR 76 | Temp 98.3°F | Resp 20 | Ht 65.0 in | Wt 117.0 lb

## 2020-08-03 DIAGNOSIS — N183 Chronic kidney disease, stage 3 unspecified: Secondary | ICD-10-CM

## 2020-08-03 DIAGNOSIS — E1159 Type 2 diabetes mellitus with other circulatory complications: Secondary | ICD-10-CM | POA: Diagnosis not present

## 2020-08-03 DIAGNOSIS — E11311 Type 2 diabetes mellitus with unspecified diabetic retinopathy with macular edema: Secondary | ICD-10-CM | POA: Diagnosis not present

## 2020-08-03 DIAGNOSIS — I10 Essential (primary) hypertension: Secondary | ICD-10-CM

## 2020-08-03 DIAGNOSIS — E1165 Type 2 diabetes mellitus with hyperglycemia: Secondary | ICD-10-CM

## 2020-08-03 DIAGNOSIS — E441 Mild protein-calorie malnutrition: Secondary | ICD-10-CM

## 2020-08-03 MED ORDER — BENAZEPRIL HCL 20 MG PO TABS: 20.0000 mg | ORAL_TABLET | Freq: Every day | ORAL | 3 refills | Status: DC

## 2020-08-03 NOTE — Assessment & Plan Note (Signed)
-  INCREASED his benazepril d/t high BP today

## 2020-08-03 NOTE — Assessment & Plan Note (Signed)
Wt Readings from Last 3 Encounters:  08/03/20 117 lb (53.1 kg)  07/14/20 119 lb 9.6 oz (54.3 kg)  06/16/20 124 lb 1.6 oz (56.3 kg)  -2 lbs weight loss since last OV -continue glucerna -checking labs

## 2020-08-03 NOTE — Assessment & Plan Note (Signed)
-  will check labs 

## 2020-08-03 NOTE — Patient Instructions (Signed)
Please have fasting labs drawn this week. We will call with results.  At your next appointment ,we will do same-day fasting labs.

## 2020-08-03 NOTE — Progress Notes (Signed)
Acute Office Visit  Subjective:    Patient ID: Jacob Barnes, male    DOB: 04-24-1940, 80 y.o.   MRN: [id number]  Chief Complaint  Patient presents with  . Hypertension  . Follow-up    HPI Patient is in today for lab follow-up.  He did not have fasting labs drawn prior to his visit today.  He has hx of DM and HTN as well as CKD. No adverse med effects.  He takes tresiba daily as well as tradjenta for DM.   Past Medical History:  Diagnosis Date  . Abrasion of right middle finger with infection    for 1 week,   . 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 Anemia   . Arthritis    hands  . Carpal tunnel syndrome 08/05/2009   Qualifier: Diagnosis of  By: Aline Brochure MD, Dorothyann Peng    . Chronic kidney disease   . Diabetes mellitus    says since 1979 type 2  . Diabetes mellitus without complication ([place])    Phreesia 04/03/2020  . ERECTILE DYSFUNCTION, ORGANIC 01/20/2009   Qualifier: Diagnosis of  By: Moshe Cipro MD, Joycelyn Schmid    . GERD (gastroesophageal reflux disease)   . Glaucoma    both eyes  . History of kidney stones   . Hyperlipemia   . Hypertension   . Iron deficiency anemia 01/10/2014  . Other pancytopenia ([place]) 01/10/2014   New in 01/2014, undergoing pathology review   . Rash    front abdomen no drainage    Past Surgical History:  Procedure Laterality Date  . AMPUTATION Right 09/01/2016   Procedure: AMPUTATION RIGHT MIDDLE FINGER TIP;  Surgeon: Mcarthur Rossetti, MD;  Location: [facility];  Service: Orthopedics;  Laterality: Right;  . AMPUTATION Right 10/10/2016   Procedure: REPEAT IRRIGATION AND DEBRIDEMENT RIGHT MIDDLE FINGER WITH AMPUTATION THROUGH PROXIMAL PHALANX;  Surgeon: Mcarthur Rossetti, MD;  Location: [facility];  Service: Orthopedics;  Laterality: Right;  . APPENDECTOMY    . COLONOSCOPY  09/06/2011   Procedure: COLONOSCOPY;  Surgeon: Rogene Houston, MD;  Location: [facility];  Service: Endoscopy;  Laterality: N/A;  830  .  CYSTOSCOPY/RETROGRADE/URETEROSCOPY/STONE EXTRACTION WITH BASKET    . ESOPHAGOGASTRODUODENOSCOPY N/A 01/15/2014   Procedure: ESOPHAGOGASTRODUODENOSCOPY (EGD);  Surgeon: Rogene Houston, MD;  Location: [facility];  Service: Endoscopy;  Laterality: N/A;  200  . ESOPHAGOGASTRODUODENOSCOPY N/A 04/22/2014   Procedure: ESOPHAGOGASTRODUODENOSCOPY (EGD);  Surgeon: Rogene Houston, MD;  Location: [facility];  Service: Endoscopy;  Laterality: N/A;  240  . EYE SURGERY Bilateral yrs ago   ioc for cataract  . MALONEY DILATION N/A 01/15/2014   Procedure: MALONEY DILATION;  Surgeon: Rogene Houston, MD;  Location: [facility];  Service: Endoscopy;  Laterality: N/A;  . MALONEY DILATION  04/22/2014   Procedure: Venia Minks DILATION;  Surgeon: Rogene Houston, MD;  Location: [facility];  Service: Endoscopy;;  . surgery for left eye bleedf  2017    Family History  Problem Relation Age of Onset  . Diabetes Mother   . Hypertension Mother   . Hyperlipidemia Mother   . Stroke Mother   . Heart failure Mother   . Diabetes Father   . Heart disease Father   . Hypertension Father   . Hyperlipidemia Father   . Heart attack Father   . Diabetes Brother   . Diabetes Brother   . Diabetes Son     Social History   Socioeconomic History  . Marital  status: Married    Spouse name: Not on file  . Number of children: Not on file  . Years of education: Not on file  . Highest education level: Not on file  Occupational History  . Not on file  Tobacco Use  . Smoking status: Never Smoker  . Smokeless tobacco: Never Used  Vaping Use  . Vaping Use: Never used  Substance and Sexual Activity  . Alcohol use: No  . Drug use: No  . Sexual activity: Yes  Other Topics Concern  . Not on file  Social History Narrative  . Not on file   Social Determinants of Health   Financial Resource Strain: Low Risk   . Difficulty of Paying Living Expenses: Not hard at all  Food Insecurity: No Food Insecurity  . Worried  About Charity fundraiser in the Last Year: Never true  . Ran Out of Food in the Last Year: Never true  Transportation Needs: No Transportation Needs  . Lack of Transportation (Medical): No  . Lack of Transportation (Non-Medical): No  Physical Activity: Insufficiently Active  . Days of Exercise per Week: 5 days  . Minutes of Exercise per Session: 10 min  Stress: No Stress Concern Present  . Feeling of Stress : Not at all  Social Connections: Moderately Isolated  . Frequency of Communication with Friends and Family: More than three times a week  . Frequency of Social Gatherings with Friends and Family: More than three times a week  . Attends Religious Services: Never  . Active Member of Clubs or Organizations: No  . Attends Archivist Meetings: Never  . Marital Status: Married  Human resources officer Violence: Not At Risk  . Fear of Current or Ex-Partner: No  . Emotionally Abused: No  . Physically Abused: No  . Sexually Abused: No    Outpatient Medications Prior to Visit  Medication Sig Dispense Refill  . blood glucose meter kit and supplies Dispense based on patient and insurance preference. Use up to three times daily as directed. (dx E11.65). 1 each 0  . Calcium Carbonate (CALCIUM 600 PO) Take 1 tablet by mouth daily.    . Cholecalciferol (VITAMIN D3 PO) Take 4,000 Units by mouth daily.    . dorzolamide-timolol (COSOPT) 22.3-6.8 MG/ML ophthalmic solution 1 drop 2 (two) times daily.    Marland Kitchen glucose blood (ONETOUCH VERIO) test strip USE as instrusted UP TO four times daily 1 each 2  . insulin degludec (TRESIBA FLEXTOUCH) 100 UNIT/ML FlexTouch Pen Inject 10 Units into the skin at bedtime. 9 mL 3  . Iron, Ferrous Sulfate, 325 (65 Fe) MG TABS Take one tablet by mouth once daily 90 tablet 1  . latanoprost (XALATAN) 0.005 % ophthalmic solution     . linagliptin (TRADJENTA) 5 MG TABS tablet Take 1 tablet (5 mg total) by mouth daily. 90 tablet 1  . pantoprazole (PROTONIX) 40 MG tablet  TAKE 1 TABLET BY MOUTH EVERY DAY 30 tablet 3  . rosuvastatin (CRESTOR) 5 MG tablet Take one tablet by mouth every Monday , Wednesday and Friday 36 tablet 1  . benazepril (LOTENSIN) 10 MG tablet TAKE 1 TABLET BY MOUTH EVERY DAY 90 tablet 1  . docusate sodium (COLACE) 100 MG capsule Take 1 capsule (100 mg total) by mouth daily as needed for mild constipation. 90 capsule 1   No facility-administered medications prior to visit.    Allergies  Allergen Reactions  . No Known Allergies     Review of Systems  Constitutional: Negative.   Respiratory: Negative.   Cardiovascular: Negative.   Musculoskeletal: Negative.   Psychiatric/Behavioral: Negative.        Objective:    Physical Exam Constitutional:      Appearance: Normal appearance.  Cardiovascular:     Rate and Rhythm: Normal rate and regular rhythm.     Pulses: Normal pulses.     Heart sounds: Normal heart sounds.  Pulmonary:     Effort: Pulmonary effort is normal.     Breath sounds: Normal breath sounds.  Musculoskeletal:     Comments: Uses cane for ambulation  Neurological:     Mental Status: He is alert.  Psychiatric:        Mood and Affect: Mood normal.        Behavior: Behavior normal.        Thought Content: Thought content normal.        Judgment: Judgment normal.     BP (!) 163/70   Pulse 76   Temp 98.3 F (36.8 C)   Resp 20   Ht '5\' 5"'  (1.651 m)   Wt 117 lb (53.1 kg)   SpO2 98%   BMI 19.47 kg/m  Wt Readings from Last 3 Encounters:  08/03/20 117 lb (53.1 kg)  07/14/20 119 lb 9.6 oz (54.3 kg)  06/16/20 124 lb 1.6 oz (56.3 kg)    Health Maintenance Due  Topic Date Due  . OPHTHALMOLOGY EXAM  10/09/2017  . TETANUS/TDAP  12/22/2018  . FOOT EXAM  02/27/2020    There are no preventive care reminders to display for this patient.   Lab Results  Component Value Date   TSH 1.300 03/31/2020   Lab Results  Component Value Date   WBC 4.2 09/30/2019   HGB 12.2 (L) 09/30/2019   HCT 36.4 (L)  09/30/2019   MCV 94.3 09/30/2019   PLT 118 (L) 09/30/2019   Lab Results  Component Value Date   NA 139 03/31/2020   K 4.5 03/31/2020   CO2 26 03/31/2020   GLUCOSE 187 (H) 03/31/2020   BUN 12 03/31/2020   CREATININE 1.31 (H) 03/31/2020   BILITOT 0.4 03/31/2020   ALKPHOS 109 03/31/2020   AST 16 03/31/2020   ALT 8 03/31/2020   PROT 8.6 (H) 03/31/2020   ALBUMIN 4.3 03/31/2020   CALCIUM 9.4 03/31/2020   ANIONGAP 7 10/10/2016   Lab Results  Component Value Date   CHOL 114 09/30/2019   Lab Results  Component Value Date   HDL 44 09/30/2019   Lab Results  Component Value Date   LDLCALC 58 09/30/2019   Lab Results  Component Value Date   TRIG 42 09/30/2019   Lab Results  Component Value Date   CHOLHDL 2.6 09/30/2019   Lab Results  Component Value Date   HGBA1C 10.2 (A) 07/14/2020       Assessment & Plan:   Problem List Items Addressed This Visit      Cardiovascular and Mediastinum   Essential hypertension, benign - Primary    -INCREASED his benazepril d/t high BP today       Relevant Medications   benazepril (LOTENSIN) 20 MG tablet   Other Relevant Orders   CBC with Differential/Platelet   CMP14+EGFR   Type 2 diabetes mellitus with vascular disease ([place])    -will check A1c with labs; ordered today -on ACEi and statin      Relevant Medications   benazepril (LOTENSIN) 20 MG tablet     Endocrine   Diabetic macular  edema ([facility])    -needs eye exam      Relevant Medications   benazepril (LOTENSIN) 20 MG tablet   Uncontrolled type 2 diabetes mellitus with hyperglycemia ([facility])   Relevant Medications   benazepril (LOTENSIN) 20 MG tablet   Other Relevant Orders   Lipid Panel With LDL/HDL Ratio   Hemoglobin A1c   Microalbumin / creatinine urine ratio     Genitourinary   CKD (chronic kidney disease) stage 3, GFR 30-59 ml/min ([facility])    -will check labs         Other   Mild protein-calorie malnutrition ([place])    Wt Readings from Last 3 Encounters:   08/03/20 117 lb (53.1 kg)  07/14/20 119 lb 9.6 oz (54.3 kg)  06/16/20 124 lb 1.6 oz (56.3 kg)  -2 lbs weight loss since last OV -continue glucerna -checking labs           Meds ordered this encounter  Medications  . benazepril (LOTENSIN) 20 MG tablet    Sig: Take 1 tablet (20 mg total) by mouth daily.    Dispense:  90 tablet    Refill:  [place], NP

## 2020-08-03 NOTE — Assessment & Plan Note (Signed)
-  needs eye exam

## 2020-08-03 NOTE — Assessment & Plan Note (Signed)
-  will check A1c with labs; ordered today -on ACEi and statin

## 2020-08-05 ENCOUNTER — Other Ambulatory Visit: Payer: Self-pay | Admitting: Nurse Practitioner

## 2020-08-05 DIAGNOSIS — E1165 Type 2 diabetes mellitus with hyperglycemia: Secondary | ICD-10-CM

## 2020-08-05 NOTE — Progress Notes (Signed)
His kidney and liver function look great.  His A1c is very high, so his blood sugar is not controlled. We will get him in with endocrinology for education and diabetes management.

## 2020-08-06 LAB — CBC WITH DIFFERENTIAL/PLATELET
Basophils Absolute: 0 10*3/uL (ref 0.0–0.2)
Basos: 0 %
EOS (ABSOLUTE): 0 10*3/uL (ref 0.0–0.4)
Eos: 0 %
Hematocrit: 40 % (ref 37.5–51.0)
Hemoglobin: 13.3 g/dL (ref 13.0–17.7)
Immature Grans (Abs): 0 10*3/uL (ref 0.0–0.1)
Immature Granulocytes: 0 %
Lymphocytes Absolute: 1.8 10*3/uL (ref 0.7–3.1)
Lymphs: 46 %
MCH: 30.9 pg (ref 26.6–33.0)
MCHC: 33.3 g/dL (ref 31.5–35.7)
MCV: 93 fL (ref 79–97)
Monocytes Absolute: 1 10*3/uL — ABNORMAL HIGH (ref 0.1–0.9)
Monocytes: 26 %
Neutrophils Absolute: 1.1 10*3/uL — ABNORMAL LOW (ref 1.4–7.0)
Neutrophils: 28 %
Platelets: 137 10*3/uL — ABNORMAL LOW (ref 150–450)
RBC: 4.3 x10E6/uL (ref 4.14–5.80)
RDW: 13.1 % (ref 11.6–15.4)
WBC: 3.9 10*3/uL (ref 3.4–10.8)

## 2020-08-06 LAB — CMP14+EGFR
ALT: 17 IU/L (ref 0–44)
AST: 26 IU/L (ref 0–40)
Albumin/Globulin Ratio: 1.1 — ABNORMAL LOW (ref 1.2–2.2)
Albumin: 4.5 g/dL (ref 3.7–4.7)
Alkaline Phosphatase: 88 IU/L (ref 44–121)
BUN/Creatinine Ratio: 7 — ABNORMAL LOW (ref 10–24)
BUN: 10 mg/dL (ref 8–27)
Bilirubin Total: 0.5 mg/dL (ref 0.0–1.2)
CO2: 27 mmol/L (ref 20–29)
Calcium: 9.4 mg/dL (ref 8.6–10.2)
Chloride: 100 mmol/L (ref 96–106)
Creatinine, Ser: 1.35 mg/dL — ABNORMAL HIGH (ref 0.76–1.27)
Globulin, Total: 4.2 g/dL (ref 1.5–4.5)
Glucose: 120 mg/dL — ABNORMAL HIGH (ref 65–99)
Potassium: 4.6 mmol/L (ref 3.5–5.2)
Sodium: 141 mmol/L (ref 134–144)
Total Protein: 8.7 g/dL — ABNORMAL HIGH (ref 6.0–8.5)
eGFR: 53 mL/min/{1.73_m2} — ABNORMAL LOW (ref >59)

## 2020-08-06 LAB — MICROALBUMIN / CREATININE URINE RATIO
Creatinine, Urine: 60.1 mg/dL
Microalb/Creat Ratio: 366 mg/g creat — ABNORMAL HIGH (ref 0–29)
Microalbumin, Urine: 219.9 ug/mL

## 2020-08-06 LAB — LIPID PANEL WITH LDL/HDL RATIO
Cholesterol, Total: 97 mg/dL — ABNORMAL LOW (ref 100–199)
HDL: 50 mg/dL (ref >39)
LDL Chol Calc (NIH): 36 mg/dL (ref 0–99)
LDL/HDL Ratio: 0.7 ratio (ref 0.0–3.6)
Triglycerides: 40 mg/dL (ref 0–149)
VLDL Cholesterol Cal: 11 mg/dL (ref 5–40)

## 2020-08-06 LAB — HEMOGLOBIN A1C
Est. average glucose Bld gHb Est-mCnc: 260 mg/dL
Hgb A1c MFr Bld: 10.7 % — ABNORMAL HIGH (ref 4.8–5.6)

## 2020-09-03 ENCOUNTER — Other Ambulatory Visit: Payer: Self-pay | Admitting: Family Medicine

## 2020-09-20 ENCOUNTER — Other Ambulatory Visit: Payer: Self-pay

## 2020-09-20 MED ORDER — PEN NEEDLES 31G X 6 MM MISC: 1.0000 | Freq: Every day | 2 refills | Status: DC

## 2020-10-13 ENCOUNTER — Ambulatory Visit: Payer: Medicare HMO | Admitting: Nurse Practitioner

## 2020-10-13 ENCOUNTER — Encounter: Payer: Self-pay | Admitting: Nurse Practitioner

## 2020-10-13 VITALS — BP 134/73 | HR 71 | Ht 65.0 in | Wt 117.0 lb

## 2020-10-13 DIAGNOSIS — E782 Mixed hyperlipidemia: Secondary | ICD-10-CM

## 2020-10-13 DIAGNOSIS — I10 Essential (primary) hypertension: Secondary | ICD-10-CM | POA: Diagnosis not present

## 2020-10-13 DIAGNOSIS — E1165 Type 2 diabetes mellitus with hyperglycemia: Secondary | ICD-10-CM | POA: Diagnosis not present

## 2020-10-13 NOTE — Patient Instructions (Signed)
Diabetes Mellitus and Nutrition, Adult When you have diabetes, or diabetes mellitus, it is very important to have healthy eating habits because your blood sugar (glucose) levels are greatly affected by what you eat and drink. Eating healthy foods in the right amounts, at about the same times every day, can help you:  Control your blood glucose.  Lower your risk of heart disease.  Improve your blood pressure.  Reach or maintain a healthy weight. What can affect my meal plan? Every person with diabetes is different, and each person has different needs for a meal plan. Your health care provider may recommend that you work with a dietitian to make a meal plan that is best for you. Your meal plan may vary depending on factors such as:  The calories you need.  The medicines you take.  Your weight.  Your blood glucose, blood pressure, and cholesterol levels.  Your activity level.  Other health conditions you have, such as heart or kidney disease. How do carbohydrates affect me? Carbohydrates, also called carbs, affect your blood glucose level more than any other type of food. Eating carbs naturally raises the amount of glucose in your blood. Carb counting is a method for keeping track of how many carbs you eat. Counting carbs is important to keep your blood glucose at a healthy level, especially if you use insulin or take certain oral diabetes medicines. It is important to know how many carbs you can safely have in each meal. This is different for every person. Your dietitian can help you calculate how many carbs you should have at each meal and for each snack. How does alcohol affect me? Alcohol can cause a sudden decrease in blood glucose (hypoglycemia), especially if you use insulin or take certain oral diabetes medicines. Hypoglycemia can be a life-threatening condition. Symptoms of hypoglycemia, such as sleepiness, dizziness, and confusion, are similar to symptoms of having too much  alcohol.  Do not drink alcohol if: ? Your health care provider tells you not to drink. ? You are pregnant, may be pregnant, or are planning to become pregnant.  If you drink alcohol: ? Do not drink on an empty stomach. ? Limit how much you use to:  0-1 drink a day for women.  0-2 drinks a day for men. ? Be aware of how much alcohol is in your drink. In the [place] one drink equals one 12 oz bottle of beer (355 mL), one 5 oz glass of wine (148 mL), or one 1 oz glass of hard liquor (44 mL). ? Keep yourself hydrated with water, diet soda, or unsweetened iced tea.  Keep in mind that regular soda, juice, and other mixers may contain a lot of sugar and must be counted as carbs. What are tips for following this plan? Reading food labels  Start by checking the serving size on the "Nutrition Facts" label of packaged foods and drinks. The amount of calories, carbs, fats, and other nutrients listed on the label is based on one serving of the item. Many items contain more than one serving per package.  Check the total grams (g) of carbs in one serving. You can calculate the number of servings of carbs in one serving by dividing the total carbs by 15. For example, if a food has 30 g of total carbs per serving, it would be equal to 2 servings of carbs.  Check the number of grams (g) of saturated fats and trans fats in one serving. Choose foods that have   a low amount or none of these fats.  Check the number of milligrams (mg) of salt (sodium) in one serving. Most people should limit total sodium intake to less than 2,300 mg per day.  Always check the nutrition information of foods labeled as "low-fat" or "nonfat." These foods may be higher in added sugar or refined carbs and should be avoided.  Talk to your dietitian to identify your daily goals for nutrients listed on the label. Shopping  Avoid buying canned, pre-made, or processed foods. These foods tend to be high in fat, sodium, and added  sugar.  Shop around the outside edge of the grocery store. This is where you will most often find fresh fruits and vegetables, bulk grains, fresh meats, and fresh dairy. Cooking  Use low-heat cooking methods, such as baking, instead of high-heat cooking methods like deep frying.  Cook using healthy oils, such as olive, canola, or sunflower oil.  Avoid cooking with butter, cream, or high-fat meats. Meal planning  Eat meals and snacks regularly, preferably at the same times every day. Avoid going long periods of time without eating.  Eat foods that are high in fiber, such as fresh fruits, vegetables, beans, and whole grains. Talk with your dietitian about how many servings of carbs you can eat at each meal.  Eat 4-6 oz (112-168 g) of lean protein each day, such as lean meat, chicken, fish, eggs, or tofu. One ounce (oz) of lean protein is equal to: ? 1 oz (28 g) of meat, chicken, or fish. ? 1 egg. ?  cup (62 g) of tofu.  Eat some foods each day that contain healthy fats, such as avocado, nuts, seeds, and fish.   What foods should I eat? Fruits Berries. Apples. Oranges. Peaches. Apricots. Plums. Grapes. Mango. Papaya. Pomegranate. Kiwi. Cherries. Vegetables Lettuce. Spinach. Leafy greens, including kale, chard, collard greens, and mustard greens. Beets. Cauliflower. Cabbage. Broccoli. Carrots. Green beans. Tomatoes. Peppers. Onions. Cucumbers. Brussels sprouts. Grains Whole grains, such as whole-wheat or whole-grain bread, crackers, tortillas, cereal, and pasta. Unsweetened oatmeal. Quinoa. Brown or wild rice. Meats and other proteins Seafood. Poultry without skin. Lean cuts of poultry and beef. Tofu. Nuts. Seeds. Dairy Low-fat or fat-free dairy products such as milk, yogurt, and cheese. The items listed above may not be a complete list of foods and beverages you can eat. Contact a dietitian for more information. What foods should I avoid? Fruits Fruits canned with  syrup. Vegetables Canned vegetables. Frozen vegetables with butter or cream sauce. Grains Refined white flour and flour products such as bread, pasta, snack foods, and cereals. Avoid all processed foods. Meats and other proteins Fatty cuts of meat. Poultry with skin. Breaded or fried meats. Processed meat. Avoid saturated fats. Dairy Full-fat yogurt, cheese, or milk. Beverages Sweetened drinks, such as soda or iced tea. The items listed above may not be a complete list of foods and beverages you should avoid. Contact a dietitian for more information. Questions to ask a health care provider  Do I need to meet with a diabetes educator?  Do I need to meet with a dietitian?  What number can I call if I have questions?  When are the best times to check my blood glucose? Where to find more information:  American Diabetes Association: diabetes.org  Academy of Nutrition and Dietetics: www.eatright.org  National Institute of Diabetes and Digestive and Kidney Diseases: www.niddk.nih.gov  Association of Diabetes Care and Education Specialists: www.diabeteseducator.org Summary  It is important to have healthy eating   habits because your blood sugar (glucose) levels are greatly affected by what you eat and drink.  A healthy meal plan will help you control your blood glucose and maintain a healthy lifestyle.  Your health care provider may recommend that you work with a dietitian to make a meal plan that is best for you.  Keep in mind that carbohydrates (carbs) and alcohol have immediate effects on your blood glucose levels. It is important to count carbs and to use alcohol carefully. This information is not intended to replace advice given to you by your health care provider. Make sure you discuss any questions you have with your health care provider. Document Revised: 03/04/2019 Document Reviewed: 03/04/2019 Elsevier Patient Education  2021 [employer]  

## 2020-10-13 NOTE — Progress Notes (Signed)
Endocrinology Follow Up Visit       10/13/2020, 3:52 PM   Subjective:    Patient ID: Jacob Barnes, male    DOB: Mar 10, 1941.  Jacob Barnes is being seen in follow up after being seen in consultation for management of currently uncontrolled symptomatic diabetes requested by  Fayrene Helper, MD.   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   Anemia    Arthritis    hands   Carpal tunnel syndrome 08/05/2009   Qualifier: Diagnosis of  By: Aline Brochure MD, [name]     Chronic kidney disease    Diabetes mellitus    says since 1979 type 2   Diabetes mellitus without complication ([place])    Phreesia 04/03/2020   ERECTILE DYSFUNCTION, ORGANIC 01/20/2009   Qualifier: Diagnosis of  By: Moshe Cipro MD, Margaret     GERD (gastroesophageal reflux disease)    Glaucoma    both eyes   History of kidney stones    Hyperlipemia    Hypertension    Iron deficiency anemia 01/10/2014   Other pancytopenia ([place]) 01/10/2014   New in 01/2014, undergoing pathology review    Rash    front abdomen no drainage    Past Surgical History:  Procedure Laterality Date   AMPUTATION Right 09/01/2016   Procedure: AMPUTATION RIGHT MIDDLE FINGER TIP;  Surgeon: Mcarthur Rossetti, MD;  Location: [facility];  Service: Orthopedics;  Laterality: Right;   AMPUTATION Right 10/10/2016   Procedure: REPEAT IRRIGATION AND DEBRIDEMENT RIGHT MIDDLE FINGER WITH AMPUTATION THROUGH PROXIMAL PHALANX;  Surgeon: Mcarthur Rossetti, MD;  Location: [place];  Service: Orthopedics;  Laterality: Right;   APPENDECTOMY     COLONOSCOPY  09/06/2011   Procedure: COLONOSCOPY;  Surgeon: Rogene Houston, MD;  Location: [facility];  Service: Endoscopy;  Laterality: N/A;  830   CYSTOSCOPY/RETROGRADE/URETEROSCOPY/STONE EXTRACTION WITH BASKET     ESOPHAGOGASTRODUODENOSCOPY N/A  01/15/2014   Procedure: ESOPHAGOGASTRODUODENOSCOPY (EGD);  Surgeon: Rogene Houston, MD;  [facility]: [facility];  Service: Endoscopy;  Laterality: N/A;  200   ESOPHAGOGASTRODUODENOSCOPY N/A 04/22/2014   Procedure: ESOPHAGOGASTRODUODENOSCOPY (EGD);  Surgeon: Rogene Houston, MD;  Location: [facility];  Service: Endoscopy;  Laterality: N/A;  240   EYE SURGERY Bilateral yrs ago   ioc for cataract   MALONEY DILATION N/A 01/15/2014   Procedure: MALONEY DILATION;  Surgeon: Rogene Houston, MD;  Location: [facility];  Service: Endoscopy;  Laterality: N/A;   MALONEY DILATION  04/22/2014   Procedure: Venia Minks DILATION;  Surgeon: Rogene Houston, MD;  Location: [facility];  Service: Endoscopy;;   surgery for left eye bleedf  2017    Social History   Socioeconomic History   Marital status: Married    Spouse name: Not on file   Number of children: Not on file   Years of education: Not on file   Highest education level: Not on file  Occupational History   Not on file  Tobacco Use   Smoking status: Never   Smokeless tobacco: Never  Vaping Use  Vaping Use: Never used  Substance and Sexual Activity   Alcohol use: No   Drug use: No   Sexual activity: Yes  Other Topics Concern   Not on file  Social History Narrative   Not on file   Social Determinants of Health   Financial Resource Strain: Low Risk    Difficulty of Paying Living Expenses: Not hard at all  Food Insecurity: No Food Insecurity   Worried About Charity fundraiser in the Last Year: Never true   [place] in the Last Year: Never true  Transportation Needs: No Transportation Needs   Lack of Transportation (Medical): No   Lack of Transportation (Non-Medical): No  Physical Activity: Insufficiently Active   Days of Exercise per Week: 5 days   Minutes of Exercise per Session: 10 min  Stress: No Stress Concern Present   Feeling of Stress : Not at all  Social Connections: Moderately Isolated   Frequency of  Communication with Friends and Family: More than three times a week   Frequency of Social Gatherings with Friends and Family: More than three times a week   Attends Religious Services: Never   Marine scientist or Organizations: No   Attends Music therapist: Never   Marital Status: Married    Family History  Problem Relation Age of Onset   Diabetes Mother    Hypertension Mother    Hyperlipidemia Mother    Stroke Mother    Heart failure Mother    Diabetes Father    Heart disease Father    Hypertension Father    Hyperlipidemia Father    Heart attack Father    Diabetes Brother    Diabetes Brother    Diabetes Son     Outpatient Encounter Medications as of 10/13/2020  Medication Sig   benazepril (LOTENSIN) 20 MG tablet Take 1 tablet (20 mg total) by mouth daily.   blood glucose meter kit and supplies Dispense based on patient and insurance preference. Use up to three times daily as directed. (dx E11.65).   Calcium Carbonate (CALCIUM 600 PO) Take 1 tablet by mouth daily.   Cholecalciferol (VITAMIN D3 PO) Take 4,000 Units by mouth daily.   dorzolamide-timolol (COSOPT) 22.3-6.8 MG/ML ophthalmic solution 1 drop 2 (two) times daily.   glucose blood (ONETOUCH VERIO) test strip USE as instrusted UP TO four times daily   insulin degludec (TRESIBA FLEXTOUCH) 100 UNIT/ML FlexTouch Pen Inject 10 Units into the skin at bedtime.   Insulin Pen Needle (PEN NEEDLES) 31G X 6 MM MISC 1 each by Does not apply route at bedtime.   Iron, Ferrous Sulfate, 325 (65 Fe) MG TABS Take one tablet by mouth once daily   latanoprost (XALATAN) 0.005 % ophthalmic solution    linagliptin (TRADJENTA) 5 MG TABS tablet Take 1 tablet (5 mg total) by mouth daily.   pantoprazole (PROTONIX) 40 MG tablet TAKE 1 TABLET BY MOUTH EVERY DAY   rosuvastatin (CRESTOR) 5 MG tablet Take one tablet by mouth every Monday , Wednesday and Friday   No facility-administered encounter medications on file as of 10/13/2020.     ALLERGIES: Allergies  Allergen Reactions   No Known Allergies     VACCINATION STATUS: Immunization History  Administered Date(s) Administered   Fluad Quad(high Dose 65+) 02/27/2019   Influenza,inj,Quad PF,6+ Mos 03/17/2013, 03/17/2014, 03/18/2015, 01/04/2016, 12/05/2016, 12/02/2019   Moderna Sars-Covid-2 Vaccination 06/23/2019, 07/21/2019   PPD Test 08/16/2010, 08/18/2010, 11/03/2013   Pneumococcal Conjugate-13 11/03/2013   Pneumococcal  Polysaccharide-23 12/21/2008   Td 12/21/2008    Diabetes He presents for his follow-up diabetic visit. He has type 2 diabetes mellitus. Onset time: He was diagnosed at approximate age of 76 years. His disease course has been stable. There are no hypoglycemic associated symptoms. Pertinent negatives for hypoglycemia include no confusion, headaches, pallor or seizures. Associated symptoms include blurred vision and fatigue. Pertinent negatives for diabetes include no chest pain, no polydipsia, no polyphagia, no polyuria and no weakness. There are no hypoglycemic complications. Symptoms are stable. Diabetic complications include impotence, nephropathy, peripheral neuropathy and PVD. Risk factors for coronary artery disease include diabetes mellitus, dyslipidemia, hypertension, male sex and sedentary lifestyle. Current diabetic treatment includes oral agent (monotherapy) and insulin injections. He is compliant with treatment most of the time. His weight is stable. He is following a generally healthy diet. When asked about meal planning, he reported none. He has not had a previous visit with a dietitian. He never participates in exercise. His home blood glucose trend is fluctuating minimally. His overall blood glucose range is 140-180 mg/dl. (He presents today, accompanied by his son, with no meter or logs to review.  He reports his morning glucose is usually around 160-170 on most days.  He was not due for another A1c check today.  He denies any s/s of  hypoglycemia but son is asking if he can get a CGM since he lives alone.) An ACE inhibitor/angiotensin II receptor blocker is being taken. He does not see a podiatrist.Eye exam is current.  Hyperlipidemia This is a chronic problem. The current episode started more than 1 year ago. The problem is controlled. Recent lipid tests were reviewed and are normal. Exacerbating diseases include chronic renal disease and diabetes. There are no known factors aggravating his hyperlipidemia. Pertinent negatives include no chest pain, myalgias or shortness of breath. Current antihyperlipidemic treatment includes statins. The current treatment provides moderate improvement of lipids. There are no compliance problems.  Risk factors for coronary artery disease include dyslipidemia, diabetes mellitus, hypertension, male sex, a sedentary lifestyle and family history.  Hypertension This is a chronic problem. The current episode started more than 1 year ago. The problem has been gradually improving since onset. The problem is controlled. Associated symptoms include blurred vision. Pertinent negatives include no chest pain, headaches, neck pain, palpitations or shortness of breath. There are no associated agents to hypertension. Risk factors for coronary artery disease include diabetes mellitus, dyslipidemia, male gender and sedentary lifestyle. Past treatments include ACE inhibitors. The current treatment provides moderate improvement. There are no compliance problems.  Hypertensive end-organ damage includes kidney disease and PVD. Identifiable causes of hypertension include chronic renal disease.     Review of systems  Constitutional: + Minimally fluctuating body weight,  current Body mass index is 19.47 kg/m. , no fatigue, no subjective hyperthermia, no subjective hypothermia Eyes: no blurry vision, no xerophthalmia ENT: no sore throat, no nodules palpated in throat, no dysphagia/odynophagia, no hoarseness Cardiovascular:  no chest pain, no shortness of breath, no palpitations, no leg swelling Respiratory: no cough, no shortness of breath Gastrointestinal: no nausea/vomiting/diarrhea Musculoskeletal: no muscle/joint aches, multiple falls, walks with cane for disequilibrium and deconditioning Skin: no rashes, no hyperemia Neurological: no tremors, no numbness, no tingling, no dizziness Psychiatric: no depression, no anxiety    Objective:    BP 134/73   Pulse 71   Ht '5\' 5"'  (1.651 m)   Wt 117 lb (53.1 kg)   BMI 19.47 kg/m   Wt Readings from Last 3  Encounters:  10/13/20 117 lb (53.1 kg)  08/03/20 117 lb (53.1 kg)  07/14/20 119 lb 9.6 oz (54.3 kg)    BP Readings from Last 3 Encounters:  10/13/20 134/73  08/03/20 (!) 163/70  07/14/20 (!) 144/76     Physical Exam- Limited  Constitutional:  Body mass index is 19.47 kg/m. , not in acute distress, normal state of mind Eyes:  EOMI, no exophthalmos Neck: Supple Cardiovascular: RRR, no murmurs, rubs, or gallops, no edema Respiratory: Adequate breathing efforts, no crackles, rales, rhonchi, or wheezing Musculoskeletal: no gross deformities, strength intact in all four extremities, no gross restriction of joint movements, walks with cane due to disequilibrium with falls Skin:  no rashes, no hyperemia Neurological: no tremor with outstretched hands   CMP ( most recent) CMP     Component Value Date/Time   NA 141 08/04/2020 1003   K 4.6 08/04/2020 1003   CL 100 08/04/2020 1003   CO2 27 08/04/2020 1003   GLUCOSE 120 (H) 08/04/2020 1003   GLUCOSE 101 (H) 09/30/2019 1008   BUN 10 08/04/2020 1003   CREATININE 1.35 (H) 08/04/2020 1003   CREATININE 1.36 (H) 09/30/2019 1008   CALCIUM 9.4 08/04/2020 1003   PROT 8.7 (H) 08/04/2020 1003   ALBUMIN 4.5 08/04/2020 1003   AST 26 08/04/2020 1003   ALT 17 08/04/2020 1003   ALKPHOS 88 08/04/2020 1003   BILITOT 0.5 08/04/2020 1003   GFRNONAA 51 (L) 03/31/2020 0848   GFRNONAA 49 (L) 09/30/2019 1008   GFRAA  59 (L) 03/31/2020 0848   GFRAA 57 (L) 09/30/2019 1008     Diabetic Labs (most recent): Lab Results  Component Value Date   HGBA1C 10.7 (H) 08/04/2020   HGBA1C 10.2 (A) 07/14/2020   HGBA1C 10.5 (A) 04/06/2020     Lipid Panel ( most recent) Lipid Panel     Component Value Date/Time   CHOL 97 (L) 08/04/2020 1003   TRIG 40 08/04/2020 1003   HDL 50 08/04/2020 1003   CHOLHDL 2.6 09/30/2019 1008   VLDL 7 11/27/2016 1000   LDLCALC 36 08/04/2020 1003   LDLCALC 58 09/30/2019 1008   LABVLDL 11 08/04/2020 1003      Lab Results  Component Value Date   TSH 1.300 03/31/2020   TSH 1.77 09/23/2018   TSH 0.61 07/19/2015   TSH 0.952 03/18/2015   TSH 0.921 12/11/2013   TSH 1.392 10/29/2012   TSH 0.615 09/26/2011   TSH 1.573 12/21/2008   FREET4 1.26 03/31/2020      Assessment & Plan:   1) Uncontrolled type 2 diabetes mellitus with hyperglycemia (HCC)  - Jacob Barnes has currently uncontrolled symptomatic type 2 DM since  80 years of age.  He presents today, accompanied by his son, with no meter or logs to review.  He reports his morning glucose is usually around 160-170 on most days.  He was not due for another A1c check today.  He denies any s/s of hypoglycemia but son is asking if he can get a CGM since he lives alone.   Recent labs reviewed.  - I had a long discussion with him about the progressive nature of diabetes and the pathology behind its complications. -his diabetes is complicated by peripheral arterial disease and he remains at a high risk for more acute and chronic complications which include CAD, CVA, CKD, retinopathy, and neuropathy. These are all discussed in detail with him.  - Nutritional counseling repeated at each appointment due to patients tendency to fall  back in to old habits.  - The patient admits there is a room for improvement in their diet and drink choices. -  Suggestion is made for the patient to avoid simple carbohydrates from their diet including  Cakes, Sweet Desserts / Pastries, Ice Cream, Soda (diet and regular), Sweet Tea, Candies, Chips, Cookies, Sweet Pastries, Store Bought Juices, Alcohol in Excess of 1-2 drinks a day, Artificial Sweeteners, Coffee Creamer, and "Sugar-free" Products. This will help patient to have stable blood glucose profile and potentially avoid unintended weight gain.   - I encouraged the patient to switch to unprocessed or minimally processed complex starch and increased protein intake (animal or plant source), fruits, and vegetables.   - Patient is advised to stick to a routine mealtimes to eat 3 meals a day and avoid unnecessary snacks (to snack only to correct hypoglycemia).  - I have approached him with the following individualized plan to manage  his diabetes and patient agrees:   -In light of his advanced age and light body weight, avoiding hypoglycemia should be the number 1 priority in his care.   -Given the lack of meter or logs to review, he is advised to continue current medication regimen of Tresiba 10 units SQ nightly (had previously been taking it during the morning) and continue Tradjenta 5 mg po daily.    -He is encouraged to continue monitoring blood glucose at least once daily, before breakfast, and to call the clinic if he has readings less than 70 or greater than 300 for 3 tests in a row.  He could greatly benefit from CGM device due to his advanced age and living alone to avoid hypoglycemia.  I sent in Rx to [facility] for fulfillment.  - he is not a candidate for Metformin due to concurrent renal insufficiency.  - he is not a candidate for incretin therapy due to his body habitus.   - Specific targets for  A1c;  LDL, HDL,  and Triglycerides were discussed with the patient.  2) Blood Pressure /Hypertension:   his blood pressure is controlled to target for his age.   he is advised to continue his current medications including Benazepril 10 mg p.o. daily with breakfast .  3)  Lipids/Hyperlipidemia:    His most recent lipid panel from 08/04/20 shows controlled LDL at 36.  He is advised to continue Crestor 5 mg po daily at bedtime.  Side effects and precautions discussed with him.  4)  Weight/Diet:  His Body mass index is 19.47 kg/m.   he is not a candidate for weight loss.   Exercise, and detailed carbohydrates information provided  -  detailed on discharge instructions.  5) Osteopenia: -He will not need bisphosphonate intervention at this time.  He is advised to continue on his Vitamin D 4000 units daily, as well as Calcium carbonate 600 mg p.o. daily.  6) Chronic Care/Health Maintenance: -he is on ACEI/ARB and Statin medications and is encouraged to initiate and continue to follow up with Ophthalmology, Dentist, Podiatrist at least yearly or according to recommendations, and advised to stay away from smoking. I have recommended yearly flu vaccine and pneumonia vaccine at least every 5 years; moderate intensity exercise for up to 150 minutes weekly; and  sleep for at least 7 hours a day.  - he is  advised to maintain close follow up with Fayrene Helper, MD for primary care needs, as well as his other providers for optimal and coordinated care.     I spent  35 minutes in the care of the patient today including review of labs from [place], Lipids, Thyroid Function, Hematology (current and previous including abstractions from other facilities); face-to-face time discussing  his blood glucose readings/logs, discussing hypoglycemia and hyperglycemia episodes and symptoms, medications doses, his options of short and long term treatment based on the latest standards of care / guidelines;  discussion about incorporating lifestyle medicine;  and documenting the encounter.    Please refer to Patient Instructions for Blood Glucose Monitoring and Insulin/Medications Dosing Guide"  in media tab for additional information. Please  also refer to " Patient Self Inventory" in the Media   tab for reviewed elements of pertinent patient history.  Jacob Barnes participated in the discussions, expressed understanding, and voiced agreement with the above plans.  All questions were answered to his satisfaction. he is encouraged to contact clinic should he have any questions or concerns prior to his return visit.   Follow up plan: - Return in about 3 months (around 01/13/2021) for Diabetes F/U with A1c in office, No previsit labs, Bring meter and logs.  Rayetta Pigg, [facility] [address] [place], [place] [address] Phone: [phone] Fax: [phone]  10/13/2020, 3:52 PM

## 2020-10-15 ENCOUNTER — Other Ambulatory Visit: Payer: Self-pay | Admitting: Family Medicine

## 2020-10-18 ENCOUNTER — Other Ambulatory Visit: Payer: Self-pay | Admitting: Family Medicine

## 2020-11-01 ENCOUNTER — Encounter: Payer: Self-pay | Admitting: Family Medicine

## 2020-11-01 ENCOUNTER — Ambulatory Visit (INDEPENDENT_AMBULATORY_CARE_PROVIDER_SITE_OTHER): Payer: Medicare HMO | Admitting: Family Medicine

## 2020-11-01 ENCOUNTER — Other Ambulatory Visit: Payer: Self-pay

## 2020-11-01 VITALS — BP 124/64 | HR 78 | Resp 16 | Ht 65.0 in | Wt 117.1 lb

## 2020-11-01 DIAGNOSIS — E1165 Type 2 diabetes mellitus with hyperglycemia: Secondary | ICD-10-CM | POA: Diagnosis not present

## 2020-11-01 DIAGNOSIS — E1159 Type 2 diabetes mellitus with other circulatory complications: Secondary | ICD-10-CM | POA: Diagnosis not present

## 2020-11-01 DIAGNOSIS — M25562 Pain in left knee: Secondary | ICD-10-CM

## 2020-11-01 DIAGNOSIS — I10 Essential (primary) hypertension: Secondary | ICD-10-CM

## 2020-11-01 NOTE — Patient Instructions (Addendum)
F/U in  September in office with MD, call if you need me sooner, MMSE at visit  You are referred to Orthopedics urgently re left knee, please try to get appointment made at checkout  HBA1C, chem 7 and EGFR today, copy to Dr Liliane Channel office so that they can manage the diabetes  OK  to take tylenol eS one  to twice per day for pain  Thanks for choosing [facility], we consider it a privelige to serve you.

## 2020-11-02 ENCOUNTER — Ambulatory Visit: Payer: Medicare HMO | Admitting: Orthopaedic Surgery

## 2020-11-02 ENCOUNTER — Encounter: Payer: Self-pay | Admitting: Orthopaedic Surgery

## 2020-11-02 ENCOUNTER — Ambulatory Visit: Payer: Medicare HMO

## 2020-11-02 VITALS — BP 143/78 | HR 89 | Ht 65.0 in | Wt 118.0 lb

## 2020-11-02 DIAGNOSIS — M25562 Pain in left knee: Secondary | ICD-10-CM | POA: Diagnosis not present

## 2020-11-02 LAB — BMP8+EGFR
BUN/Creatinine Ratio: 13 (ref 10–24)
BUN: 16 mg/dL (ref 8–27)
CO2: 27 mmol/L (ref 20–29)
Calcium: 9.1 mg/dL (ref 8.6–10.2)
Chloride: 98 mmol/L (ref 96–106)
Creatinine, Ser: 1.27 mg/dL (ref 0.76–1.27)
Glucose: 126 mg/dL — ABNORMAL HIGH (ref 65–99)
Potassium: 4 mmol/L (ref 3.5–5.2)
Sodium: 138 mmol/L (ref 134–144)
eGFR: 57 mL/min/{1.73_m2} — ABNORMAL LOW (ref >59)

## 2020-11-02 LAB — HEMOGLOBIN A1C
Est. average glucose Bld gHb Est-mCnc: 235 mg/dL
Hgb A1c MFr Bld: 9.8 % — ABNORMAL HIGH (ref 4.8–5.6)

## 2020-11-02 NOTE — Progress Notes (Addendum)
Subjective:    Patient ID: Jacob Barnes, male    DOB: 13-Jan-1941, 80 y.o.   MRN: [id number]  HPI He fell on his left knee and it went under him on October 28, 2020.  He has had swelling and pain in the knee since then.  He has used a cane for a while before he fell due to weakness he has had.  He has problems standing on the left leg now.  He is not getting better.  He has no other injury.  He saw Dr. Moshe Cipro and I have reviewed her notes.   Review of Systems  Constitutional:  Positive for activity change.  Musculoskeletal:  Positive for arthralgias, gait problem and joint swelling.  All other systems reviewed and are negative. For Review of Systems, all other systems reviewed and are negative.  The following is a summary of the past history medically, past history surgically, known current medicines, social history and family history.  This information is gathered electronically by the computer from prior information and documentation.  I review this each visit and have found including this information at this point in the chart is beneficial and informative.   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   Anemia    Arthritis    hands   Carpal tunnel syndrome 08/05/2009   Qualifier: Diagnosis of  By: Aline Brochure MD, [name]     Chronic kidney disease    Diabetes mellitus    says since 1979 type 2   Diabetes mellitus without complication ([place])    Phreesia 04/03/2020   ERECTILE DYSFUNCTION, ORGANIC 01/20/2009   Qualifier: Diagnosis of  By: Moshe Cipro MD, Margaret     GERD (gastroesophageal reflux disease)    Glaucoma    both eyes   History of kidney stones    Hyperlipemia    Hypertension    Iron deficiency anemia 01/10/2014   Other pancytopenia ([place]) 01/10/2014   New in 01/2014, undergoing pathology review    Rash    front abdomen no drainage    Past Surgical History:   Procedure Laterality Date   AMPUTATION Right 09/01/2016   Procedure: AMPUTATION RIGHT MIDDLE FINGER TIP;  Surgeon: Mcarthur Rossetti, MD;  Location: [facility];  Service: Orthopedics;  Laterality: Right;   AMPUTATION Right 10/10/2016   Procedure: REPEAT IRRIGATION AND DEBRIDEMENT RIGHT MIDDLE FINGER WITH AMPUTATION THROUGH PROXIMAL PHALANX;  Surgeon: Mcarthur Rossetti, MD;  Location: [facility];  Service: Orthopedics;  Laterality: Right;   APPENDECTOMY     COLONOSCOPY  09/06/2011   Procedure: COLONOSCOPY;  Surgeon: Rogene Houston, MD;  Location: [facility];  Service: Endoscopy;  Laterality: N/A;  830   CYSTOSCOPY/RETROGRADE/URETEROSCOPY/STONE EXTRACTION WITH BASKET     ESOPHAGOGASTRODUODENOSCOPY N/A 01/15/2014   Procedure: ESOPHAGOGASTRODUODENOSCOPY (EGD);  Surgeon: Rogene Houston, MD;  Location: [facility];  Service: Endoscopy;  Laterality: N/A;  200   ESOPHAGOGASTRODUODENOSCOPY N/A 04/22/2014   Procedure: ESOPHAGOGASTRODUODENOSCOPY (EGD);  Surgeon: Rogene Houston, MD;  Location: [facility];  Service: Endoscopy;  Laterality: N/A;  240   EYE SURGERY Bilateral yrs ago   ioc for cataract   MALONEY DILATION N/A 01/15/2014   Procedure: MALONEY DILATION;  Surgeon: Rogene Houston, MD;  [facility];  Service: Endoscopy;  Laterality: N/A;   MALONEY DILATION  04/22/2014   Procedure: MALONEY DILATION;  Surgeon:  Rogene Houston, MD;  [facility];  Service: Endoscopy;;   surgery for left eye bleedf  2017    Current Outpatient Medications on File Prior to Visit  Medication Sig Dispense Refill   benazepril (LOTENSIN) 20 MG tablet Take 1 tablet (20 mg total) by mouth daily. 90 tablet 3   blood glucose meter kit and supplies Dispense based on patient and insurance preference. Use up to three times daily as directed. (dx E11.65). 1 each 0   Calcium Carbonate (CALCIUM 600 PO) Take 1 tablet by mouth daily.     Cholecalciferol (VITAMIN D3 PO) Take 4,000 Units by mouth daily.      dorzolamide-timolol (COSOPT) 22.3-6.8 MG/ML ophthalmic solution 1 drop 2 (two) times daily.     insulin degludec (TRESIBA FLEXTOUCH) 100 UNIT/ML FlexTouch Pen Inject 10 Units into the skin at bedtime. 9 mL 3   Insulin Pen Needle (PEN NEEDLES) 31G X 6 MM MISC 1 each by Does not apply route at bedtime. 100 each 2   Iron, Ferrous Sulfate, 325 (65 Fe) MG TABS Take one tablet by mouth once daily 90 tablet 1   latanoprost (XALATAN) 0.005 % ophthalmic solution      linagliptin (TRADJENTA) 5 MG TABS tablet Take 1 tablet (5 mg total) by mouth daily. 90 tablet 1   ONETOUCH VERIO test strip USE AS INSTRUSTED UP TO FOUR TIMES DAILY 100 strip 2   pantoprazole (PROTONIX) 40 MG tablet TAKE 1 TABLET BY MOUTH EVERY DAY 30 tablet 3   rosuvastatin (CRESTOR) 5 MG tablet Take one tablet by mouth every Monday , Wednesday and Friday 36 tablet 1   No current facility-administered medications on file prior to visit.    Social History   Socioeconomic History   Marital status: Married    Spouse name: Not on file   Number of children: Not on file   Years of education: Not on file   Highest education level: Not on file  Occupational History   Not on file  Tobacco Use   Smoking status: Never   Smokeless tobacco: Never  Vaping Use   Vaping Use: Never used  Substance and Sexual Activity   Alcohol use: No   Drug use: No   Sexual activity: Yes  Other Topics Concern   Not on file  Social History Narrative   Not on file   Social Determinants of Health   Financial Resource Strain: Low Risk    Difficulty of Paying Living Expenses: Not hard at all  Food Insecurity: No Food Insecurity   Worried About Charity fundraiser in the Last Year: Never true   [place] in the Last Year: Never true  Transportation Needs: No Transportation Needs   Lack of Transportation (Medical): No   Lack of Transportation (Non-Medical): No  Physical Activity: Insufficiently Active   Days of Exercise per Week: 5 days    Minutes of Exercise per Session: 10 min  Stress: No Stress Concern Present   Feeling of Stress : Not at all  Social Connections: Moderately Isolated   Frequency of Communication with Friends and Family: More than three times a week   Frequency of Social Gatherings with Friends and Family: More than three times a week   Attends Religious Services: Never   Marine scientist or Organizations: No   Attends Archivist Meetings: Never   Marital Status: Married  Human resources officer Violence: Not At Risk   Fear of Current or Ex-Partner:  No   Emotionally Abused: No   Physically Abused: No   Sexually Abused: No    Family History  Problem Relation Age of Onset   Diabetes Mother    Hypertension Mother    Hyperlipidemia Mother    Stroke Mother    Heart failure Mother    Diabetes Father    Heart disease Father    Hypertension Father    Hyperlipidemia Father    Heart attack Father    Diabetes Brother    Diabetes Brother    Diabetes Son     BP (!) 143/78   Pulse 89   Ht '5\' 5"'  (1.651 m)   Wt 118 lb (53.5 kg)   BMI 19.64 kg/m   Body mass index is 19.64 kg/m.     Objective:   Physical Exam Vitals and nursing note reviewed. Exam conducted with a chaperone present.  Constitutional:      Appearance: He is well-developed.  HENT:     Head: Normocephalic and atraumatic.  Eyes:     Conjunctiva/sclera: Conjunctivae normal.     Pupils: Pupils are equal, round, and reactive to light.  Cardiovascular:     Rate and Rhythm: Normal rate and regular rhythm.  Pulmonary:     Effort: Pulmonary effort is normal.  Abdominal:     Palpations: Abdomen is soft.  Musculoskeletal:     Cervical back: Normal range of motion and neck supple.       Legs:  Skin:    General: Skin is warm and dry.  Neurological:     Mental Status: He is alert and oriented to person, place, and time.     Cranial Nerves: No cranial nerve deficit.     Motor: No abnormal muscle tone.     Coordination:  Coordination normal.     Deep Tendon Reflexes: Reflexes are normal and symmetric. Reflexes normal.  Psychiatric:        Behavior: Behavior normal.        Thought Content: Thought content normal.        Judgment: Judgment normal.  X-rays were done of the left knee, reported separately.        Assessment & Plan:   Encounter Diagnosis  Name Primary?   Acute pain of left knee Yes   PROCEDURE NOTE:  The patient request injection, verbal consent was obtained.  The left knee was prepped appropriately after time out was performed.   Sterile technique was observed and anesthesia was provided by ethyl chloride and a 20-gauge needle was used to inject the knee area.  A 16-gauge needle was then used to aspirate the knee.  Color of fluid aspirated was blood tinged  Total cc's aspirated was 45.    Injection of 1 cc of Celestone 6 mg with several cc's of plain xylocaine was then performed.  A band aid dressing was applied.  The patient was advised to apply ice later today and tomorrow to the injection sight as needed.   I will see him in three weeks.  If it gets worse, call.  He may need MRI.  Call if any problem.  Precautions discussed.  Electronically Signed Sanjuana Kava, MD 7/26/20223:48 PM

## 2020-11-03 ENCOUNTER — Ambulatory Visit: Payer: Medicare HMO | Admitting: Family Medicine

## 2020-11-12 ENCOUNTER — Other Ambulatory Visit: Payer: Self-pay | Admitting: Family Medicine

## 2020-11-15 ENCOUNTER — Encounter: Payer: Self-pay | Admitting: Family Medicine

## 2020-11-15 NOTE — Assessment & Plan Note (Signed)
Jacob Barnes is reminded of the importance of commitment to daily physical activity for 30 minutes or more, as able and the need to limit carbohydrate intake to 30 to 60 grams per meal to help with blood sugar control.   The need to take medication as prescribed, test blood sugar as directed, and to call between visits if there is a concern that blood sugar is uncontrolled is also discussed.   Jacob Barnes is reminded of the importance of daily foot exam, annual eye examination, and good blood sugar, blood pressure and cholesterol control.  Needs to continue management with Endo, uncontrolled, slight improvement Diabetic Labs Latest Ref Rng & Units 11/01/2020 08/04/2020 07/14/2020 04/06/2020 03/31/2020  HbA1c 4.8 - 5.6 % 9.8(H) 10.7(H) 10.2(A) 10.5(A) -  Microalbumin mg/L - - 150 - -  Micro/Creat Ratio 0 - 29 mg/g creat - 366(H) - - -  Chol 100 - 199 mg/dL - 97(L) - - -  HDL >39 mg/dL - 50 - - -  Calc LDL 0 - 99 mg/dL - 36 - - -  Triglycerides 0 - 149 mg/dL - 40 - - -  Creatinine 0.76 - 1.27 mg/dL 1.27 1.35(H) - - 1.31(H)   BP/Weight 11/02/2020 11/01/2020 10/13/2020 08/03/2020 07/14/2020 06/16/2020 [id number]  Systolic BP A999333 A999333 Q000111Q [id number] 0000000  Diastolic BP 78 64 73 70 76 88 73  Wt. (Lbs) 118 117.12 117 117 119.6 124.1 121  BMI 19.64 19.49 19.47 19.47 19.9 20.65 20.14   Foot/eye exam completion dates Latest Ref Rng & Units 02/27/2019 01/21/2018  Eye Exam No Retinopathy - -  Foot Form Completion - Done Done

## 2020-11-15 NOTE — Assessment & Plan Note (Signed)
Refer to Ortho for E/M ,soonest available,  Use tylenol for pain

## 2020-11-15 NOTE — Progress Notes (Signed)
Jacob Barnes     MRN: [id number]      DOB: 1940-10-19   HPI Jacob Barnes is here c/o swollen left knee and leg following a fall at home on 10/28/2020, c/o pain Blood sugar still uncontrolled and needs to f/u with Endo  ROS Denies recent fever or chills. Denies sinus pressure, nasal congestion, ear pain or sore throat. Denies chest congestion, productive cough or wheezing. Denies chest pains, palpitations and leg swelling Denies abdominal pain, nausea, vomiting,diarrhea or constipation.   Denies dysuria, frequency, hesitancy or incontinence.  Denies headaches, seizures, numbness, or tingling. Denies depression, anxiety or insomnia. Denies skin break down or rash.   PE  BP 124/64   Pulse 78   Resp 16   Ht '5\' 5"'$  (1.651 m)   Wt 117 lb 1.9 oz (53.1 kg)   SpO2 98%   BMI 19.49 kg/m   Patient alert and oriented and in no cardiopulmonary distress.Pt in pain  HEENT: No facial asymmetry, EOMI,     Neck supple .  Chest: Clear to auscultation bilaterally.  CVS: S1, S2 no murmurs, no S3.Regular rate.  ABD: Soft non tender.   Ext: No edema  MS: Adequate ROM spine, shoulders, hips and  reduced in left knee.  Skin: bruising of skin over left knee Psych: Good eye contact, normal affect. Memory intact not anxious or depressed appearing.  CNS: CN 2-12 intact, power,  normal throughout.no focal deficits noted.   Assessment & Plan  Acute pain of left knee Refer to Ortho for E/M ,soonest available,  Use tylenol for pain  Uncontrolled type 2 diabetes mellitus with hyperglycemia [facility]) Jacob Barnes is reminded of the importance of commitment to daily physical activity for 30 minutes or more, as able and the need to limit carbohydrate intake to 30 to 60 grams per meal to help with blood sugar control.   The need to take medication as prescribed, test blood sugar as directed, and to call between visits if there is a concern that blood sugar is uncontrolled is also discussed.   Jacob Barnes is  reminded of the importance of daily foot exam, annual eye examination, and good blood sugar, blood pressure and cholesterol control.  Needs to continue management with Endo, uncontrolled, slight improvement Diabetic Labs Latest Ref Rng & Units 11/01/2020 08/04/2020 07/14/2020 04/06/2020 03/31/2020  HbA1c 4.8 - 5.6 % 9.8(H) 10.7(H) 10.2(A) 10.5(A) -  Microalbumin mg/L - - 150 - -  Micro/Creat Ratio 0 - 29 mg/g creat - 366(H) - - -  Chol 100 - 199 mg/dL - 97(L) - - -  HDL >39 mg/dL - 50 - - -  Calc LDL 0 - 99 mg/dL - 36 - - -  Triglycerides 0 - 149 mg/dL - 40 - - -  Creatinine 0.76 - 1.27 mg/dL 1.27 1.35(H) - - 1.31(H)   BP/Weight 11/02/2020 11/01/2020 10/13/2020 08/03/2020 07/14/2020 06/16/2020 99991111  Systolic BP A999333 A999333 Q000111Q [id number] 123456 A999333 0000000  Diastolic BP 78 64 73 70 76 88 73  Wt. (Lbs) 118 117.12 117 117 119.6 124.1 121  BMI 19.64 19.49 19.47 19.47 19.9 20.65 20.14   Foot/eye exam completion dates Latest Ref Rng & Units 02/27/2019 01/21/2018  Eye Exam No Retinopathy - -  Foot Form Completion - Done Done        Essential hypertension, benign Controlled, no change in medication, pt in pain DASH diet and commitment to daily physical activity for a minimum of 30 minutes discussed and encouraged, as a  part of hypertension management. The importance of attaining a healthy weight is also discussed.  BP/Weight 11/02/2020 11/01/2020 10/13/2020 08/03/2020 07/14/2020 06/16/2020 99991111  Systolic BP A999333 A999333 Q000111Q [id number] 123456 A999333 0000000  Diastolic BP 78 64 73 70 76 88 73  Wt. (Lbs) 118 117.12 117 117 119.6 124.1 121  BMI 19.64 19.49 19.47 19.47 19.9 20.65 20.14

## 2020-11-15 NOTE — Assessment & Plan Note (Signed)
Controlled, no change in medication, pt in pain DASH diet and commitment to daily physical activity for a minimum of 30 minutes discussed and encouraged, as a part of hypertension management. The importance of attaining a healthy weight is also discussed.  BP/Weight 11/02/2020 11/01/2020 10/13/2020 08/03/2020 07/14/2020 06/16/2020 [id number]  Systolic BP A999333 A999333 Q000111Q [id number] 123456 A999333 0000000  Diastolic BP 78 64 73 70 76 88 73  Wt. (Lbs) 118 117.12 117 117 119.6 124.1 121  BMI 19.64 19.49 19.47 19.47 19.9 20.65 20.14

## 2020-11-23 ENCOUNTER — Other Ambulatory Visit: Payer: Self-pay

## 2020-11-23 ENCOUNTER — Ambulatory Visit: Payer: Medicare HMO | Admitting: Orthopaedic Surgery

## 2020-11-23 ENCOUNTER — Encounter: Payer: Self-pay | Admitting: Orthopaedic Surgery

## 2020-11-23 VITALS — BP 160/86 | HR 76 | Ht 65.0 in | Wt 119.8 lb

## 2020-11-23 DIAGNOSIS — G8929 Other chronic pain: Secondary | ICD-10-CM | POA: Diagnosis not present

## 2020-11-23 DIAGNOSIS — M25562 Pain in left knee: Secondary | ICD-10-CM | POA: Diagnosis not present

## 2020-11-23 NOTE — Progress Notes (Signed)
My knee still hurts.  He has more swelling of the left knee medially now.  He has giving way.  He is using a cane.  He has no new trauma.  The injection last time helped.  Left knee has slight effusion, crepitus, ROM 0 to 100 with pain, uses a cane, limp left, positive medial McMurray.  I am concerned about medial meniscus tear.  Encounter Diagnosis  Name Primary?   Chronic pain of left knee Yes   I will get MRI of the left knee.  Return in three weeks.  Call if any problem.  Precautions discussed.  Electronically Signed Sanjuana Kava, MD 8/16/202210:58 AM

## 2020-11-23 NOTE — Patient Instructions (Addendum)
While we are working on your approval for MRI please go ahead and call to schedule your appointment with [facility] within at least one (1) week.   Central Scheduling [phone]  

## 2020-11-23 NOTE — Addendum Note (Signed)
Addended byCandice Camp on: 11/23/2020 11:03 AM   Modules accepted: Orders

## 2020-12-02 ENCOUNTER — Telehealth: Payer: Self-pay

## 2020-12-02 ENCOUNTER — Ambulatory Visit (HOSPITAL_COMMUNITY)
Admission: RE | Admit: 2020-12-02 | Discharge: 2020-12-02 | Disposition: A | Discharge status: Home / Self Care | Payer: Medicare HMO | Source: Ambulatory Visit | Attending: Orthopaedic Surgery | Admitting: Orthopaedic Surgery

## 2020-12-02 ENCOUNTER — Other Ambulatory Visit: Payer: Self-pay

## 2020-12-02 DIAGNOSIS — M25562 Pain in left knee: Secondary | ICD-10-CM | POA: Insufficient documentation

## 2020-12-02 DIAGNOSIS — G8929 Other chronic pain: Secondary | ICD-10-CM | POA: Insufficient documentation

## 2020-12-02 NOTE — Telephone Encounter (Signed)
She will check when she gets home where it needs to go.

## 2020-12-02 NOTE — Telephone Encounter (Signed)
I had sent one in through [employer] and included that he was missing the tips of many fingers to see if it would help him get covered.  This is the response I got: "Pt must been 3 or more injections with HMO [employer]. Pt does not qualify. Thank you for your referral."  Would she like me to send in the rx to a different pharmacy to try?

## 2020-12-02 NOTE — Telephone Encounter (Signed)
Pt's sister, Karrie Doffing said she would like a RX for a Elenor Legato, she called and insurance will cover it. It has to be sent in that he can not do the finger sticks, she said he has a hard time and sometimes can not even get a reading. She said he is wasting strips and still not able to get readings.  [phone], please her call her.

## 2020-12-06 ENCOUNTER — Other Ambulatory Visit: Payer: Self-pay

## 2020-12-06 ENCOUNTER — Telehealth: Payer: Self-pay

## 2020-12-06 MED ORDER — FREESTYLE LIBRE 2 SENSOR MISC: 1.0000 | 5 refills | Status: DC

## 2020-12-06 MED ORDER — FREESTYLE LIBRE 2 READER DEVI: 1.0000 | Freq: Four times a day (QID) | 0 refills | Status: DC

## 2020-12-06 NOTE — Telephone Encounter (Signed)
The crestor is on his list to be taken mon wed and fri but hasn't been refilled since last year. Do you still want him taking it as is on his med list?

## 2020-12-06 NOTE — Telephone Encounter (Signed)
Will you send [facility] to [facility] [place]?

## 2020-12-06 NOTE — Telephone Encounter (Signed)
[facility] to [facility]

## 2020-12-06 NOTE — Telephone Encounter (Signed)
Patients sister calling in regards to his medication she states his med list shows hes supposed to be taking crestor and vit d 3 and hes not taking either one shes trying to find out if he should be or not ph# [phone]

## 2020-12-07 ENCOUNTER — Other Ambulatory Visit: Payer: Self-pay

## 2020-12-07 DIAGNOSIS — E782 Mixed hyperlipidemia: Secondary | ICD-10-CM

## 2020-12-07 MED ORDER — ROSUVASTATIN CALCIUM 5 MG PO TABS: ORAL_TABLET | ORAL | 3 refills | Status: DC

## 2020-12-12 ENCOUNTER — Other Ambulatory Visit: Payer: Self-pay | Admitting: "Endocrinology

## 2020-12-16 ENCOUNTER — Telehealth: Payer: Self-pay

## 2020-12-16 NOTE — Telephone Encounter (Signed)
Patient called has questions on medicine Crestor.  Call back # [phone]

## 2020-12-17 ENCOUNTER — Other Ambulatory Visit: Payer: Self-pay

## 2020-12-17 MED ORDER — IRON (FERROUS SULFATE) 325 (65 FE) MG PO TABS: ORAL_TABLET | ORAL | 3 refills | Status: DC

## 2020-12-17 NOTE — Telephone Encounter (Signed)
Jacob Barnes had already spoke to the pharmacist about her concerns

## 2020-12-21 ENCOUNTER — Other Ambulatory Visit: Payer: Self-pay

## 2020-12-21 ENCOUNTER — Ambulatory Visit: Payer: Medicare HMO | Admitting: Orthopaedic Surgery

## 2020-12-21 DIAGNOSIS — M25562 Pain in left knee: Secondary | ICD-10-CM | POA: Diagnosis not present

## 2020-12-21 DIAGNOSIS — G8929 Other chronic pain: Secondary | ICD-10-CM | POA: Diagnosis not present

## 2020-12-21 NOTE — Progress Notes (Signed)
My knee is a little better.  He had MRI of the left knee.  It showed: IMPRESSION: 1. Osteoarthritis of the left knee, most severe within the patellofemoral compartment. 2. 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 3. Grade 2 MCL sprain. 4. Large knee joint effusion. 5. Small complex Baker's cyst containing a 12 x 7 mm loose body.    I have explained the findings.  I have showed him a model of the knee.  I would not recommend surgery now unless he has more pain.  I have independently reviewed the MRI.    I have recommended Aspercreme, Biofreeze or Voltaren Gel.  ROM of the left knee is 0 to 105, he has a cane, limp to the left, effusion, more medial pain, positive Medial McMurray, no distal edema, NV intact.  Encounter Diagnosis  Name Primary?   Chronic pain of left knee Yes   I will see in one month.  Call if any problem.  Precautions discussed.  Electronically Signed Sanjuana Kava, MD 9/13/20222:29 PM

## 2020-12-29 ENCOUNTER — Ambulatory Visit: Payer: Medicare HMO | Admitting: Family Medicine

## 2020-12-30 ENCOUNTER — Ambulatory Visit: Payer: Medicare HMO | Admitting: Family Medicine

## 2021-01-11 ENCOUNTER — Other Ambulatory Visit: Payer: Self-pay | Admitting: Family Medicine

## 2021-01-13 ENCOUNTER — Ambulatory Visit: Payer: Medicare HMO | Admitting: Orthopaedic Surgery

## 2021-01-13 ENCOUNTER — Encounter: Payer: Self-pay | Admitting: Orthopaedic Surgery

## 2021-01-13 ENCOUNTER — Ambulatory Visit: Payer: Medicare HMO | Admitting: Nurse Practitioner

## 2021-01-13 ENCOUNTER — Other Ambulatory Visit: Payer: Self-pay

## 2021-01-13 VITALS — BP 134/77 | HR 80 | Ht 65.0 in | Wt 122.8 lb

## 2021-01-13 DIAGNOSIS — M25562 Pain in left knee: Secondary | ICD-10-CM

## 2021-01-13 DIAGNOSIS — G8929 Other chronic pain: Secondary | ICD-10-CM

## 2021-01-13 DIAGNOSIS — I10 Essential (primary) hypertension: Secondary | ICD-10-CM

## 2021-01-13 DIAGNOSIS — E782 Mixed hyperlipidemia: Secondary | ICD-10-CM

## 2021-01-13 DIAGNOSIS — M8580 Other specified disorders of bone density and structure, unspecified site: Secondary | ICD-10-CM

## 2021-01-13 DIAGNOSIS — E1165 Type 2 diabetes mellitus with hyperglycemia: Secondary | ICD-10-CM

## 2021-01-13 NOTE — Progress Notes (Signed)
I am better.  His left knee is not hurting today. He has no new pain, much less swelling.  ROM of the left knee is full, he has crepitus but no effusion. Gait normal.  Encounter Diagnosis  Name Primary?   Chronic pain of left knee Yes   I will see as needed.  Call if any problem.  Precautions discussed.  Electronically Signed Sanjuana Kava, MD 10/6/202211:19 AM

## 2021-01-18 ENCOUNTER — Ambulatory Visit: Payer: Medicare HMO | Admitting: Orthopaedic Surgery

## 2021-01-20 ENCOUNTER — Ambulatory Visit (INDEPENDENT_AMBULATORY_CARE_PROVIDER_SITE_OTHER): Payer: Medicare HMO | Admitting: Family Medicine

## 2021-01-20 ENCOUNTER — Ambulatory Visit: Payer: Medicare HMO | Admitting: Orthopaedic Surgery

## 2021-01-20 ENCOUNTER — Ambulatory Visit: Payer: Medicare HMO | Admitting: Nurse Practitioner

## 2021-01-20 ENCOUNTER — Encounter: Payer: Self-pay | Admitting: Nurse Practitioner

## 2021-01-20 ENCOUNTER — Other Ambulatory Visit: Payer: Self-pay

## 2021-01-20 VITALS — BP 120/64 | HR 68 | Ht 65.0 in | Wt 121.4 lb

## 2021-01-20 VITALS — BP 130/69 | HR 72 | Resp 17 | Ht 65.0 in | Wt 120.1 lb

## 2021-01-20 DIAGNOSIS — E782 Mixed hyperlipidemia: Secondary | ICD-10-CM | POA: Diagnosis not present

## 2021-01-20 DIAGNOSIS — I10 Essential (primary) hypertension: Secondary | ICD-10-CM

## 2021-01-20 DIAGNOSIS — E1165 Type 2 diabetes mellitus with hyperglycemia: Secondary | ICD-10-CM | POA: Diagnosis not present

## 2021-01-20 DIAGNOSIS — K219 Gastro-esophageal reflux disease without esophagitis: Secondary | ICD-10-CM

## 2021-01-20 DIAGNOSIS — F03B0 Unspecified dementia, moderate, without behavioral disturbance, psychotic disturbance, mood disturbance, and anxiety: Secondary | ICD-10-CM | POA: Diagnosis not present

## 2021-01-20 DIAGNOSIS — Z23 Encounter for immunization: Secondary | ICD-10-CM

## 2021-01-20 DIAGNOSIS — Z794 Long term (current) use of insulin: Secondary | ICD-10-CM

## 2021-01-20 MED ORDER — DONEPEZIL HCL 5 MG PO TABS
5.0000 mg | ORAL_TABLET | Freq: Every day | ORAL | 1 refills | Status: DC
Start: 2021-01-20 — End: 2021-02-21

## 2021-01-20 NOTE — Patient Instructions (Signed)
F/u in 8 to 10 weeks with [facility]  Flu vaccine at visit today  Foot exam today .  Please give appointment at [facility] at checkout if possible, has very long toenails  New for memory is aricept 5 mg one daily at bedtime   Thanks for choosing [facility], we consider it a privelige to serve you.

## 2021-01-20 NOTE — Progress Notes (Signed)
Endocrinology Follow Up Visit       10/13/2020, 3:52 PM   Subjective:    Patient ID: Jacob Barnes, male    DOB: Jul 16, 1940.  Jacob Barnes is being seen in follow up after being seen in consultation for management of currently uncontrolled symptomatic diabetes requested by  Fayrene Helper, MD.   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   Anemia    Arthritis    hands   Carpal tunnel syndrome 08/05/2009   Qualifier: Diagnosis of  By: Aline Brochure MD, [name]     Chronic kidney disease    Diabetes mellitus    says since 1979 type 2   Diabetes mellitus without complication ([place])    Phreesia 04/03/2020   ERECTILE DYSFUNCTION, ORGANIC 01/20/2009   Qualifier: Diagnosis of  By: Moshe Cipro MD, Margaret     GERD (gastroesophageal reflux disease)    Glaucoma    both eyes   History of kidney stones    Hyperlipemia    Hypertension    Iron deficiency anemia 01/10/2014   Other pancytopenia ([place]) 01/10/2014   New in 01/2014, undergoing pathology review    Rash    front abdomen no drainage    Past Surgical History:  Procedure Laterality Date   AMPUTATION Right 09/01/2016   Procedure: AMPUTATION RIGHT MIDDLE FINGER TIP;  Surgeon: Mcarthur Rossetti, MD;  Location: [facility];  Service: Orthopedics;  Laterality: Right;   AMPUTATION Right 10/10/2016   Procedure: REPEAT IRRIGATION AND DEBRIDEMENT RIGHT MIDDLE FINGER WITH AMPUTATION THROUGH PROXIMAL PHALANX;  Surgeon: Mcarthur Rossetti, MD;  Location: [place];  Service: Orthopedics;  Laterality: Right;   APPENDECTOMY     COLONOSCOPY  09/06/2011   Procedure: COLONOSCOPY;  Surgeon: Rogene Houston, MD;  Location: [facility];  Service: Endoscopy;  Laterality: N/A;  830   CYSTOSCOPY/RETROGRADE/URETEROSCOPY/STONE EXTRACTION WITH BASKET     ESOPHAGOGASTRODUODENOSCOPY N/A  01/15/2014   Procedure: ESOPHAGOGASTRODUODENOSCOPY (EGD);  Surgeon: Rogene Houston, MD;  [facility];  Service: Endoscopy;  Laterality: N/A;  200   ESOPHAGOGASTRODUODENOSCOPY N/A 04/22/2014   Procedure: ESOPHAGOGASTRODUODENOSCOPY (EGD);  Surgeon: Rogene Houston, MD;  [facility];  Service: Endoscopy;  Laterality: N/A;  240   EYE SURGERY Bilateral yrs ago   ioc for cataract   MALONEY DILATION N/A 01/15/2014   Procedure: MALONEY DILATION;  Surgeon: Rogene Houston, MD;  [facility];  Service: Endoscopy;  Laterality: N/A;   MALONEY DILATION  04/22/2014   Procedure: Venia Minks DILATION;  Surgeon: Rogene Houston, MD;  [facility];  Service: Endoscopy;;   surgery for left eye bleedf  2017    Social History   Socioeconomic History   Marital status: Married    Spouse name: Not on file   Number of children: Not on file   Years of education: Not on file   Highest education level: Not on file  Occupational History   Not on file  Tobacco Use   Smoking status: Never   Smokeless tobacco: Never  Vaping Use  Vaping Use: Never used  Substance and Sexual Activity   Alcohol use: No   Drug use: No   Sexual activity: Yes  Other Topics Concern   Not on file  Social History Narrative   Not on file   Social Determinants of Health   Financial Resource Strain: Low Risk    Difficulty of Paying Living Expenses: Not hard at all  Food Insecurity: No Food Insecurity   Worried About Charity fundraiser in the Last Year: Never true   [place] in the Last Year: Never true  Transportation Needs: No Transportation Needs   Lack of Transportation (Medical): No   Lack of Transportation (Non-Medical): No  Physical Activity: Insufficiently Active   Days of Exercise per Week: 5 days   Minutes of Exercise per Session: 10 min  Stress: No Stress Concern Present   Feeling of Stress : Not at all  Social Connections: Moderately Isolated   Frequency of  Communication with Friends and Family: More than three times a week   Frequency of Social Gatherings with Friends and Family: More than three times a week   Attends Religious Services: Never   Marine scientist or Organizations: No   Attends Music therapist: Never   Marital Status: Married    Family History  Problem Relation Age of Onset   Diabetes Mother    Hypertension Mother    Hyperlipidemia Mother    Stroke Mother    Heart failure Mother    Diabetes Father    Heart disease Father    Hypertension Father    Hyperlipidemia Father    Heart attack Father    Diabetes Brother    Diabetes Brother    Diabetes Son     Outpatient Encounter Medications as of 10/13/2020  Medication Sig   benazepril (LOTENSIN) 20 MG tablet Take 1 tablet (20 mg total) by mouth daily.   blood glucose meter kit and supplies Dispense based on patient and insurance preference. Use up to three times daily as directed. (dx E11.65).   Calcium Carbonate (CALCIUM 600 PO) Take 1 tablet by mouth daily.   Cholecalciferol (VITAMIN D3 PO) Take 4,000 Units by mouth daily.   dorzolamide-timolol (COSOPT) 22.3-6.8 MG/ML ophthalmic solution 1 drop 2 (two) times daily.   glucose blood (ONETOUCH VERIO) test strip USE as instrusted UP TO four times daily   insulin degludec (TRESIBA FLEXTOUCH) 100 UNIT/ML FlexTouch Pen Inject 10 Units into the skin at bedtime.   Insulin Pen Needle (PEN NEEDLES) 31G X 6 MM MISC 1 each by Does not apply route at bedtime.   Iron, Ferrous Sulfate, 325 (65 Fe) MG TABS Take one tablet by mouth once daily   latanoprost (XALATAN) 0.005 % ophthalmic solution    linagliptin (TRADJENTA) 5 MG TABS tablet Take 1 tablet (5 mg total) by mouth daily.   pantoprazole (PROTONIX) 40 MG tablet TAKE 1 TABLET BY MOUTH EVERY DAY   rosuvastatin (CRESTOR) 5 MG tablet Take one tablet by mouth every Monday , Wednesday and Friday   No facility-administered encounter medications on file as of 10/13/2020.     ALLERGIES: Allergies  Allergen Reactions   No Known Allergies     VACCINATION STATUS: Immunization History  Administered Date(s) Administered   Fluad Quad(high Dose 65+) 02/27/2019   Influenza,inj,Quad PF,6+ Mos 03/17/2013, 03/17/2014, 03/18/2015, 01/04/2016, 12/05/2016, 12/02/2019   Moderna Sars-Covid-2 Vaccination 06/23/2019, 07/21/2019   PPD Test 08/16/2010, 08/18/2010, 11/03/2013   Pneumococcal Conjugate-13 11/03/2013   Pneumococcal  Polysaccharide-23 12/21/2008   Td 12/21/2008    Diabetes He presents for his follow-up diabetic visit. He has type 2 diabetes mellitus. Onset time: He was diagnosed at approximate age of 42 years. His disease course has been improving. Hypoglycemia symptoms include sweats and tremors. Pertinent negatives for hypoglycemia include no confusion, headaches, pallor or seizures. Associated symptoms include blurred vision and fatigue. Pertinent negatives for diabetes include no chest pain, no polydipsia, no polyphagia, no polyuria and no weakness. There are no hypoglycemic complications. Symptoms are stable. Diabetic complications include impotence, nephropathy, peripheral neuropathy and PVD. Risk factors for coronary artery disease include diabetes mellitus, dyslipidemia, hypertension, male sex and sedentary lifestyle. Current diabetic treatment includes oral agent (monotherapy) and insulin injections. He is compliant with treatment most of the time. His weight is stable. He is following a generally healthy diet. When asked about meal planning, he reported none. He has not had a previous visit with a dietitian. He never participates in exercise. His home blood glucose trend is decreasing steadily. His overall blood glucose range is 130-140 mg/dl. (He presents today, accompanied by his sister, with his meter, no logs showing at target fasting glycemic profile.  He was not due for another A1c today.  Analysis of his meter shows 7-day average of 145, 14-day average of  144, 30 and 90 day average of 134.  He does report rare hypoglycemia (likely due to timing).) An ACE inhibitor/angiotensin II receptor blocker is being taken. He does not see a podiatrist.Eye exam is current.  Hyperlipidemia This is a chronic problem. The current episode started more than 1 year ago. The problem is controlled. Recent lipid tests were reviewed and are normal. Exacerbating diseases include chronic renal disease and diabetes. There are no known factors aggravating his hyperlipidemia. Pertinent negatives include no chest pain, myalgias or shortness of breath. Current antihyperlipidemic treatment includes statins. The current treatment provides moderate improvement of lipids. There are no compliance problems.  Risk factors for coronary artery disease include dyslipidemia, diabetes mellitus, hypertension, male sex, a sedentary lifestyle and family history.  Hypertension This is a chronic problem. The current episode started more than 1 year ago. The problem has been gradually improving since onset. The problem is controlled. Associated symptoms include blurred vision and sweats. Pertinent negatives include no chest pain, headaches, neck pain, palpitations or shortness of breath. There are no associated agents to hypertension. Risk factors for coronary artery disease include diabetes mellitus, dyslipidemia, male gender and sedentary lifestyle. Past treatments include ACE inhibitors. The current treatment provides moderate improvement. There are no compliance problems.  Hypertensive end-organ damage includes kidney disease and PVD. Identifiable causes of hypertension include chronic renal disease.     Review of systems  Constitutional: + Minimally fluctuating body weight,  current Body mass index is 20.2 kg/m. , no fatigue, no subjective hyperthermia, no subjective hypothermia Eyes: no blurry vision, no xerophthalmia ENT: no sore throat, no nodules palpated in throat, no dysphagia/odynophagia,  no hoarseness Cardiovascular: no chest pain, no shortness of breath, no palpitations, no leg swelling Respiratory: no cough, no shortness of breath Gastrointestinal: no nausea/vomiting/diarrhea Musculoskeletal: no muscle/joint aches, reports multiple falls Skin: no rashes, no hyperemia Neurological: no tremors, no numbness, no tingling, no dizziness Psychiatric: no depression, no anxiety    Objective:    BP 134/73   Pulse 71   Ht '5\' 5"'  (1.651 m)   Wt 117 lb (53.1 kg)   BMI 19.47 kg/m   Wt Readings from Last 3 Encounters:  10/13/20 117  lb (53.1 kg)  08/03/20 117 lb (53.1 kg)  07/14/20 119 lb 9.6 oz (54.3 kg)    BP Readings from Last 3 Encounters:  10/13/20 134/73  08/03/20 (!) 163/70  07/14/20 (!) 144/76     Physical Exam- Limited  Constitutional:  Body mass index is 19.47 kg/m. , not in acute distress, normal state of mind Eyes:  EOMI, no exophthalmos Neck: Supple Cardiovascular: RRR, no murmurs, rubs, or gallops, no edema Respiratory: Adequate breathing efforts, no crackles, rales, rhonchi, or wheezing Musculoskeletal: missing tips of some fingers, strength intact in all four extremities, no gross restriction of joint movements Skin:  no rashes, no hyperemia Neurological: no tremor with outstretched hands   CMP ( most recent) CMP     Component Value Date/Time   NA 141 08/04/2020 1003   K 4.6 08/04/2020 1003   CL 100 08/04/2020 1003   CO2 27 08/04/2020 1003   GLUCOSE 120 (H) 08/04/2020 1003   GLUCOSE 101 (H) 09/30/2019 1008   BUN 10 08/04/2020 1003   CREATININE 1.35 (H) 08/04/2020 1003   CREATININE 1.36 (H) 09/30/2019 1008   CALCIUM 9.4 08/04/2020 1003   PROT 8.7 (H) 08/04/2020 1003   ALBUMIN 4.5 08/04/2020 1003   AST 26 08/04/2020 1003   ALT 17 08/04/2020 1003   ALKPHOS 88 08/04/2020 1003   BILITOT 0.5 08/04/2020 1003   GFRNONAA 51 (L) 03/31/2020 0848   GFRNONAA 49 (L) 09/30/2019 1008   GFRAA 59 (L) 03/31/2020 0848   GFRAA 57 (L) 09/30/2019 1008      Diabetic Labs (most recent): Lab Results  Component Value Date   HGBA1C 10.7 (H) 08/04/2020   HGBA1C 10.2 (A) 07/14/2020   HGBA1C 10.5 (A) 04/06/2020     Lipid Panel ( most recent) Lipid Panel     Component Value Date/Time   CHOL 97 (L) 08/04/2020 1003   TRIG 40 08/04/2020 1003   HDL 50 08/04/2020 1003   CHOLHDL 2.6 09/30/2019 1008   VLDL 7 11/27/2016 1000   LDLCALC 36 08/04/2020 1003   LDLCALC 58 09/30/2019 1008   LABVLDL 11 08/04/2020 1003      Lab Results  Component Value Date   TSH 1.300 03/31/2020   TSH 1.77 09/23/2018   TSH 0.61 07/19/2015   TSH 0.952 03/18/2015   TSH 0.921 12/11/2013   TSH 1.392 10/29/2012   TSH 0.615 09/26/2011   TSH 1.573 12/21/2008   FREET4 1.26 03/31/2020      Assessment & Plan:   1) Uncontrolled type 2 diabetes mellitus with hyperglycemia (HCC)  - Jacob Barnes has currently uncontrolled symptomatic type 2 DM since  80 years of age.  He presents today, accompanied by his sister, with his meter, no logs showing at target fasting glycemic profile.  He was not due for another A1c today.  Analysis of his meter shows 7-day average of 145, 14-day average of 144, 30 and 90 day average of 134.  He does report rare hypoglycemia (likely due to timing).  His sister is continuing to try and help him get CGM.   Recent labs reviewed.  - I had a long discussion with him about the progressive nature of diabetes and the pathology behind its complications. -his diabetes is complicated by peripheral arterial disease and he remains at a high risk for more acute and chronic complications which include CAD, CVA, CKD, retinopathy, and neuropathy. These are all discussed in detail with him.  - Nutritional counseling repeated at each appointment due to patients tendency  to fall back in to old habits.  - The patient admits there is a room for improvement in their diet and drink choices. -  Suggestion is made for the patient to avoid simple carbohydrates  from their diet including Cakes, Sweet Desserts / Pastries, Ice Cream, Soda (diet and regular), Sweet Tea, Candies, Chips, Cookies, Sweet Pastries, Store Bought Juices, Alcohol in Excess of 1-2 drinks a day, Artificial Sweeteners, Coffee Creamer, and "Sugar-free" Products. This will help patient to have stable blood glucose profile and potentially avoid unintended weight gain.   - I encouraged the patient to switch to unprocessed or minimally processed complex starch and increased protein intake (animal or plant source), fruits, and vegetables.   - Patient is advised to stick to a routine mealtimes to eat 3 meals a day and avoid unnecessary snacks (to snack only to correct hypoglycemia).  - I have approached him with the following individualized plan to manage  his diabetes and patient agrees:   -In light of his advanced age and light body weight, avoiding hypoglycemia should be the number 1 priority in his care.   -Based on his stable glycemic profile, he is advised to continue Tresiba 10 units SQ nightly and Tradjenta 5 mg po daily.    -He is encouraged to continue monitoring blood glucose at least once daily, before breakfast, and to call the clinic if he has readings less than 70 or greater than 300 for 3 tests in a row.    - he is not a candidate for Metformin due to concurrent renal insufficiency.  - he is not a candidate for incretin therapy due to his body habitus.   - Specific targets for  A1c;  LDL, HDL,  and Triglycerides were discussed with the patient.  2) Blood Pressure /Hypertension:   his blood pressure is controlled to target for his age.   he is advised to continue his current medications including Benazepril 10 mg p.o. daily with breakfast .  3) Lipids/Hyperlipidemia:    His most recent lipid panel from 08/04/20 shows controlled LDL at 36.  He is advised to continue Crestor 5 mg po daily at bedtime.  Side effects and precautions discussed with him.  4)  Weight/Diet:  His  Body mass index is 19.47 kg/m.   he is not a candidate for weight loss.   Exercise, and detailed carbohydrates information provided  -  detailed on discharge instructions.  5) Osteopenia: -He will not need bisphosphonate intervention at this time.  He is advised to continue on his Vitamin D 4000 units daily, as well as Calcium carbonate 600 mg p.o. daily.  6) Chronic Care/Health Maintenance: -he is on ACEI/ARB and Statin medications and is encouraged to initiate and continue to follow up with Ophthalmology, Dentist, Podiatrist at least yearly or according to recommendations, and advised to stay away from smoking. I have recommended yearly flu vaccine and pneumonia vaccine at least every 5 years; moderate intensity exercise for up to 150 minutes weekly; and  sleep for at least 7 hours a day.  - he is  advised to maintain close follow up with Fayrene Helper, MD for primary care needs, as well as his other providers for optimal and coordinated care.       I spent 20 minutes in the care of the patient today including review of labs from [place], Lipids, Thyroid Function, Hematology (current and previous including abstractions from other facilities); face-to-face time discussing  his blood glucose readings/logs, discussing hypoglycemia and  hyperglycemia episodes and symptoms, medications doses, his options of short and long term treatment based on the latest standards of care / guidelines;  discussion about incorporating lifestyle medicine;  and documenting the encounter.    Please refer to Patient Instructions for Blood Glucose Monitoring and Insulin/Medications Dosing Guide"  in media tab for additional information. Please  also refer to " Patient Self Inventory" in the Media  tab for reviewed elements of pertinent patient history.  Rhett Bannister participated in the discussions, expressed understanding, and voiced agreement with the above plans.  All questions were answered to his satisfaction. he is  encouraged to contact clinic should he have any questions or concerns prior to his return visit.   Follow up plan: - Return in about 3 months (around 01/13/2021) for Diabetes F/U with A1c in office, No previsit labs, Bring meter and logs.  Rayetta Pigg, [facility] [address] [place], [place] [address] Phone: [phone] Fax: [phone]  10/13/2020, 3:52 PM

## 2021-01-24 ENCOUNTER — Encounter: Payer: Self-pay | Admitting: Family Medicine

## 2021-01-24 DIAGNOSIS — F0390 Unspecified dementia without behavioral disturbance: Secondary | ICD-10-CM | POA: Insufficient documentation

## 2021-01-24 NOTE — Assessment & Plan Note (Signed)
Controlled, no change in medication DASH diet and commitment to daily physical activity for a minimum of 30 minutes discussed and encouraged, as a part of hypertension management. The importance of attaining a healthy weight is also discussed.  BP/Weight 01/20/2021 01/20/2021 01/13/2021 11/23/2020 11/02/2020 04/20/5518 8/0/2233  Systolic BP 612 244 975 300 511 021 117  Diastolic BP 64 69 77 86 78 64 73  Wt. (Lbs) 121.4 120.12 122.8 119.8 118 117.12 117  BMI 20.2 19.99 20.43 19.94 19.64 19.49 19.47

## 2021-01-24 NOTE — Assessment & Plan Note (Signed)
uncontrolled and managed by [name] is reminded of the importance of commitment to daily physical activity for 30 minutes or more, as able and the need to limit carbohydrate intake to 30 to 60 grams per meal to help with blood sugar control.   The need to take medication as prescribed, test blood sugar as directed, and to call between visits if there is a concern that blood sugar is uncontrolled is also discussed.   Jacob Barnes is reminded of the importance of daily foot exam, annual eye examination, and good blood sugar, blood pressure and cholesterol control.  Diabetic Labs Latest Ref Rng & Units 11/01/2020 08/04/2020 07/14/2020 04/06/2020 03/31/2020  HbA1c 4.8 - 5.6 % 9.8(H) 10.7(H) 10.2(A) 10.5(A) -  Microalbumin mg/L - - 150 - -  Micro/Creat Ratio 0 - 29 mg/g creat - 366(H) - - -  Chol 100 - 199 mg/dL - 97(L) - - -  HDL >39 mg/dL - 50 - - -  Calc LDL 0 - 99 mg/dL - 36 - - -  Triglycerides 0 - 149 mg/dL - 40 - - -  Creatinine 0.76 - 1.27 mg/dL 1.27 1.35(H) - - 1.31(H)   BP/Weight 01/20/2021 01/20/2021 01/13/2021 11/23/2020 11/02/2020 06/29/2246 05/16/35  Systolic BP 048 889 169 450 388 828 003  Diastolic BP 64 69 77 86 78 64 73  Wt. (Lbs) 121.4 120.12 122.8 119.8 118 117.12 117  BMI 20.2 19.99 20.43 19.94 19.64 19.49 19.47   Foot/eye exam completion dates Latest Ref Rng & Units 02/27/2019 01/21/2018  Eye Exam No Retinopathy - -  Foot Form Completion - Done Done

## 2021-01-24 NOTE — Progress Notes (Signed)
Jacob Barnes     MRN: [id number]      DOB: 1940/04/18   HPI Jacob Barnes is here for follow up and re-evaluation of chronic medical conditions, medication management and review of any available recent lab and radiology data.  Preventive health is updated, specifically  Cancer screening and Immunization.   Questions or concerns regarding consultations or procedures which the PT has had in the interim are  addressed. The PT denies any adverse reactions to current medications since the last visit.  C/o memory loss progressive per sister ROS Denies recent fever or chills. Denies sinus pressure, nasal congestion, ear pain or sore throat. Denies chest congestion, productive cough or wheezing. Denies chest pains, palpitations and leg swelling Denies abdominal pain, nausea, vomiting,diarrhea or constipation.   Denies dysuria, frequency, hesitancy or incontinence. Denies  uncontrolled joint pain, swelling and limitation in mobility. Denies headaches, seizures, numbness, or tingling. Denies depression, anxiety or insomnia. Denies skin break down or rash.   PE  BP 130/69   Pulse 72   Resp 17   Ht 5\' 5"  (1.651 m)   Wt 120 lb 1.9 oz (54.5 kg)   SpO2 98%   BMI 19.99 kg/m   Patient alert and oriented and in no cardiopulmonary distress.  HEENT: No facial asymmetry, EOMI,     Neck supple .  Chest: Clear to auscultation bilaterally.  CVS: S1, S2 no murmurs, no S3.Regular rate.  ABD: Soft non tender.   Ext: No edema  MS: Adequate though reduced  ROM spine, shoulders, hips and knees.  Skin: Intact, no ulcerations or rash noted.  Psych: Good eye contact, normal affect. Memory impaired not anxious or depressed appearing.  CNS: CN 2-12 intact, power,  normal throughout.no focal deficits noted.   Assessment & Plan  Uncontrolled type 2 diabetes mellitus with hyperglycemia ([place]) uncontrolled and managed by [name] is reminded of the importance of commitment to daily physical  activity for 30 minutes or more, as able and the need to limit carbohydrate intake to 30 to 60 grams per meal to help with blood sugar control.   The need to take medication as prescribed, test blood sugar as directed, and to call between visits if there is a concern that blood sugar is uncontrolled is also discussed.   Jacob Barnes is reminded of the importance of daily foot exam, annual eye examination, and good blood sugar, blood pressure and cholesterol control.  Diabetic Labs Latest Ref Rng & Units 11/01/2020 08/04/2020 07/14/2020 04/06/2020 03/31/2020  HbA1c 4.8 - 5.6 % 9.8(H) 10.7(H) 10.2(A) 10.5(A) -  Microalbumin mg/L - - 150 - -  Micro/Creat Ratio 0 - 29 mg/g creat - 366(H) - - -  Chol 100 - 199 mg/dL - 97(L) - - -  HDL >39 mg/dL - 50 - - -  Calc LDL 0 - 99 mg/dL - 36 - - -  Triglycerides 0 - 149 mg/dL - 40 - - -  Creatinine 0.76 - 1.27 mg/dL 1.27 1.35(H) - - 1.31(H)   BP/Weight 01/20/2021 01/20/2021 01/13/2021 11/23/2020 11/02/2020 2/95/1884 04/15/6061  Systolic BP 016 010 932 355 732 202 542  Diastolic BP 64 69 77 86 78 64 73  Wt. (Lbs) 121.4 120.12 122.8 119.8 118 117.12 117  BMI 20.2 19.99 20.43 19.94 19.64 19.49 19.47   Foot/eye exam completion dates Latest Ref Rng & Units 02/27/2019 01/21/2018  Eye Exam No Retinopathy - -  Foot Form Completion - Done Done  Mixed hyperlipidemia Hyperlipidemia:Low fat diet discussed and encouraged.   Lipid Panel  Lab Results  Component Value Date   CHOL 97 (L) 08/04/2020   HDL 50 08/04/2020   LDLCALC 36 08/04/2020   TRIG 40 08/04/2020   CHOLHDL 2.6 09/30/2019   Controlled, no change in medication     Dementia (HCC) Start aricept 5 mg daily and will titrrate up to 10 mg daily as tolerated in 4 to 8 weeks  GERD (gastroesophageal reflux disease) Controlled, no change in medication   Essential hypertension, benign Controlled, no change in medication DASH diet and commitment to daily physical activity for a minimum of 30  minutes discussed and encouraged, as a part of hypertension management. The importance of attaining a healthy weight is also discussed.  BP/Weight 01/20/2021 01/20/2021 01/13/2021 11/23/2020 11/02/2020 09/01/8183 9/0/9311  Systolic BP 216 244 695 072 257 505 183  Diastolic BP 64 69 77 86 78 64 73  Wt. (Lbs) 121.4 120.12 122.8 119.8 118 117.12 117  BMI 20.2 19.99 20.43 19.94 19.64 19.49 19.47

## 2021-01-24 NOTE — Assessment & Plan Note (Signed)
Hyperlipidemia:Low fat diet discussed and encouraged.   Lipid Panel  Lab Results  Component Value Date   CHOL 97 (L) 08/04/2020   HDL 50 08/04/2020   LDLCALC 36 08/04/2020   TRIG 40 08/04/2020   CHOLHDL 2.6 09/30/2019   Controlled, no change in medication

## 2021-01-24 NOTE — Assessment & Plan Note (Signed)
Start aricept 5 mg daily and will titrrate up to 10 mg daily as tolerated in 4 to 8 weeks

## 2021-01-24 NOTE — Assessment & Plan Note (Signed)
Controlled, no change in medication  

## 2021-02-07 ENCOUNTER — Ambulatory Visit (INDEPENDENT_AMBULATORY_CARE_PROVIDER_SITE_OTHER): Payer: Medicare HMO | Admitting: Podiatry

## 2021-02-07 ENCOUNTER — Encounter: Payer: Self-pay | Admitting: Podiatry

## 2021-02-07 ENCOUNTER — Other Ambulatory Visit: Payer: Self-pay

## 2021-02-07 DIAGNOSIS — B351 Tinea unguium: Secondary | ICD-10-CM | POA: Diagnosis not present

## 2021-02-07 DIAGNOSIS — M79675 Pain in left toe(s): Secondary | ICD-10-CM | POA: Diagnosis not present

## 2021-02-07 DIAGNOSIS — E1159 Type 2 diabetes mellitus with other circulatory complications: Secondary | ICD-10-CM

## 2021-02-07 DIAGNOSIS — M79674 Pain in right toe(s): Secondary | ICD-10-CM

## 2021-02-07 DIAGNOSIS — E1165 Type 2 diabetes mellitus with hyperglycemia: Secondary | ICD-10-CM

## 2021-02-07 NOTE — Progress Notes (Signed)
  Subjective:  Patient ID: Jacob Barnes, male    DOB: 1940-06-14,   MRN: [id number]  Chief Complaint  Patient presents with   Diabetes    Diabetic foot care     80 y.o. male presents for diabetic foot check and nail care. Patient relates thickened discolored nails that are painful and difficult to cut. Denies any pain in his feet. Last A1c was 9.8 on 11/01/20.  Marland Kitchen Denies any other pedal complaints. Denies n/v/f/c.   PCP: [name] MD   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   Anemia    Arthritis    hands   Carpal tunnel syndrome 08/05/2009   Qualifier: Diagnosis of  By: Aline Brochure MD, [name]     Chronic kidney disease    Diabetes mellitus    says since 1979 type 2   Diabetes mellitus without complication ([place])    Phreesia 04/03/2020   ERECTILE DYSFUNCTION, ORGANIC 01/20/2009   Qualifier: Diagnosis of  By: Moshe Cipro MD, Margaret     GERD (gastroesophageal reflux disease)    Glaucoma    both eyes   History of kidney stones    Hyperlipemia    Hypertension    Iron deficiency anemia 01/10/2014   Other pancytopenia ([place]) 01/10/2014   New in 01/2014, undergoing pathology review    Rash    front abdomen no drainage    Objective:  Physical Exam: Vascular: DP/PT pulses 2/4 bilateral. CFT <3 seconds. Normal hair growth on digits. No edema.  Skin. No lacerations or abrasions bilateral feet. Nails 1-5 are thickened discolored and elongated with subungual debris.  Musculoskeletal: MMT 5/5 bilateral lower extremities in DF, PF, Inversion and Eversion. Deceased ROM in DF of ankle joint.  Neurological: Sensation intact to light touch. Diminished protective sensation   Assessment:   1. Type 2 diabetes mellitus with vascular disease ([place])   2. Uncontrolled type 2 diabetes mellitus with hyperglycemia ([place])   3. Onychomycosis   4. Pain due to onychomycosis of toenails of both  feet      Plan:  Patient was evaluated and treated and all questions answered. -Discussed and educated patient on diabetic foot care, especially with  regards to the vascular, neurological and musculoskeletal systems.  -Stressed the importance of good glycemic control and the detriment of not  controlling glucose levels in relation to the foot. -Discussed supportive shoes at all times and checking feet regularly.  -Mechanically debrided all nails 1-5 bilateral using sterile nail nipper and filed with dremel without incident  -Answered all patient questions -Patient to return  in 3 months for at risk foot care -Patient advised to call the office if any problems or questions arise in the meantime.   Lorenda Peck, DPM

## 2021-02-19 ENCOUNTER — Other Ambulatory Visit: Payer: Self-pay | Admitting: Family Medicine

## 2021-03-07 ENCOUNTER — Other Ambulatory Visit: Payer: Self-pay | Admitting: Family Medicine

## 2021-03-09 ENCOUNTER — Other Ambulatory Visit: Payer: Self-pay

## 2021-03-29 ENCOUNTER — Encounter: Payer: Self-pay | Admitting: Family Medicine

## 2021-03-29 ENCOUNTER — Ambulatory Visit (INDEPENDENT_AMBULATORY_CARE_PROVIDER_SITE_OTHER): Payer: Medicare HMO | Admitting: Family Medicine

## 2021-03-29 ENCOUNTER — Other Ambulatory Visit: Payer: Self-pay

## 2021-03-29 VITALS — BP 128/82 | HR 73 | Resp 17 | Ht 65.0 in | Wt 112.0 lb

## 2021-03-29 DIAGNOSIS — F03B0 Unspecified dementia, moderate, without behavioral disturbance, psychotic disturbance, mood disturbance, and anxiety: Secondary | ICD-10-CM

## 2021-03-29 DIAGNOSIS — E1159 Type 2 diabetes mellitus with other circulatory complications: Secondary | ICD-10-CM

## 2021-03-29 DIAGNOSIS — I10 Essential (primary) hypertension: Secondary | ICD-10-CM | POA: Diagnosis not present

## 2021-03-29 DIAGNOSIS — N183 Chronic kidney disease, stage 3 unspecified: Secondary | ICD-10-CM

## 2021-03-29 MED ORDER — PANTOPRAZOLE SODIUM 40 MG PO TBEC: 40.0000 mg | DELAYED_RELEASE_TABLET | Freq: Every day | ORAL | 3 refills | Status: DC

## 2021-03-29 MED ORDER — DONEPEZIL HCL 10 MG PO TABS: 10.0000 mg | ORAL_TABLET | Freq: Every day | ORAL | 3 refills | Status: DC

## 2021-03-29 NOTE — Patient Instructions (Addendum)
F/U in 8 weeks, call if you need me sooner, MMSE at that visit   hBA1c, TSH , cmp and EGFR and cBC today  Concerned about weight loss, need to increase food intake! New higher dose of aricept 10 mg take every eveneing \ Thanks for choosing [facility], we consider it a privelige to serve you.

## 2021-03-29 NOTE — Progress Notes (Signed)
Jacob Barnes     MRN: [id number]      DOB: [birth year]   HPI [name] is here for follow up and re-evaluation of chronic medical conditions, medication management and review of any available recent lab and radiology data.  Preventive health is updated, specifically  Cancer screening and Immunization.   Questions or concerns regarding consultations or procedures which the PT has had in the interim are  addressed. The PT denies any adverse reactions to current medications since the last visit.  There are no new concerns.  There are no specific complaints   ROS Denies recent fever or chills. Denies sinus pressure, nasal congestion, ear pain or sore throat. Denies chest congestion, productive cough or wheezing. Denies chest pains, palpitations and leg swelling Denies abdominal pain, nausea, vomiting,diarrhea or constipation.   Denies dysuria, frequency, hesitancy or incontinence. Denies joint pain, swelling and limitation in mobility. Denies headaches, seizures, numbness, or tingling. Denies depression, anxiety or insomnia. Denies skin break down or rash.   PE  BP 128/82    Pulse 73    Resp 17    Ht 5\' 5"  (1.651 m)    Wt 112 lb (50.8 kg)    SpO2 98%    BMI 18.64 kg/m   Patient alert and oriented and in no cardiopulmonary distress.  HEENT: No facial asymmetry, EOMI,     Neck supple .  Chest: Clear to auscultation bilaterally.  CVS: S1, S2 no murmurs, no S3.Regular rate.  ABD: Soft non tender.   Ext: No edema  MS: Adequate ROM spine, shoulders, hips and knees.  Skin: Intact, no ulcerations or rash noted.  Psych: Good eye contact, normal affect. Memory loss, not anxious or depressed appearing.  CNS: CN 2-12 intact, power,  normal throughout.no focal deficits noted.   Assessment & Plan CKD (chronic kidney disease) stage 3, GFR 30-59 ml/min (HCC) Deterioration noted, efer to nephrology  Dementia (Jacob Barnes) Increase to trreating dose aricept and re eval in 8 weeks, olerating  5 mg dose with no adverse s/e  Essential hypertension, benign DASH diet and commitment to daily physical activity for a minimum of 30 minutes discussed and encouraged, as a part of hypertension management. The importance of attaining a healthy weight is also discussed.  BP/Weight 03/29/2021 01/20/2021 01/20/2021 01/13/2021 11/23/2020 11/02/2020 2/77/4128  Systolic BP 786 767 209 470 962 836 629  Diastolic BP 82 64 69 77 86 78 64  Wt. (Lbs) 112 121.4 120.12 122.8 119.8 118 117.12  BMI 18.64 20.2 19.99 20.43 19.94 19.64 19.49       Type 2 diabetes mellitus with proliferative retinopathy of both eyes and macular edema [facility]) [name] is reminded of the importance of commitment to daily physical activity for 30 minutes or more, as able and the need to limit carbohydrate intake to 30 to 60 grams per meal to help with blood sugar control.   The need to take medication as prescribed, test blood sugar as directed, and to call between visits if there is a concern that blood sugar is uncontrolled is also discussed.   [name] is reminded of the importance of daily foot exam, annual eye examination, and good blood sugar, blood pressure and cholesterol control. Improved bu still uncontrolled  F/u with Endo  Diabetic Labs Latest Ref Rng & Units 03/29/2021 11/01/2020 08/04/2020 07/14/2020 04/06/2020  HbA1c 4.8 - 5.6 % 8.5(H) 9.8(H) 10.7(H) 10.2(A) 10.5(A)  Microalbumin mg/L - - - 150 -  Micro/Creat Ratio 0 - 29 mg/g  creat - - 366(H) - -  Chol 100 - 199 mg/dL - - 97(L) - -  HDL >39 mg/dL - - 50 - -  Calc LDL 0 - 99 mg/dL - - 36 - -  Triglycerides 0 - 149 mg/dL - - 40 - -  Creatinine 0.76 - 1.27 mg/dL 1.49(H) 1.27 1.35(H) - -   BP/Weight 03/29/2021 01/20/2021 01/20/2021 01/13/2021 11/23/2020 11/02/2020 11/09/6551  Systolic BP 748 270 786 754 492 010 071  Diastolic BP 82 64 69 77 86 78 64  Wt. (Lbs) 112 121.4 120.12 122.8 119.8 118 117.12  BMI 18.64 20.2 19.99 20.43 19.94 19.64 19.49   Foot/eye exam  completion dates Latest Ref Rng & Units 02/27/2019 01/21/2018  Eye Exam No Retinopathy - -  Foot Form Completion - Done Done

## 2021-03-30 ENCOUNTER — Other Ambulatory Visit: Payer: Self-pay

## 2021-03-30 LAB — CMP14+EGFR
ALT: 11 IU/L (ref 0–44)
AST: 27 IU/L (ref 0–40)
Albumin/Globulin Ratio: 1 — ABNORMAL LOW (ref 1.2–2.2)
Albumin: 3.9 g/dL (ref 3.6–4.6)
Alkaline Phosphatase: 89 IU/L (ref 44–121)
BUN/Creatinine Ratio: 11 (ref 10–24)
BUN: 17 mg/dL (ref 8–27)
Bilirubin Total: 0.4 mg/dL (ref 0.0–1.2)
CO2: 27 mmol/L (ref 20–29)
Calcium: 9 mg/dL (ref 8.6–10.2)
Chloride: 100 mmol/L (ref 96–106)
Creatinine, Ser: 1.49 mg/dL — ABNORMAL HIGH (ref 0.76–1.27)
Globulin, Total: 4 g/dL (ref 1.5–4.5)
Glucose: 216 mg/dL — ABNORMAL HIGH (ref 70–99)
Potassium: 4.3 mmol/L (ref 3.5–5.2)
Sodium: 137 mmol/L (ref 134–144)
Total Protein: 7.9 g/dL (ref 6.0–8.5)
eGFR: 47 mL/min/{1.73_m2} — ABNORMAL LOW (ref >59)

## 2021-03-30 LAB — CBC
Hematocrit: 35.4 % — ABNORMAL LOW (ref 37.5–51.0)
Hemoglobin: 12 g/dL — ABNORMAL LOW (ref 13.0–17.7)
MCH: 30.5 pg (ref 26.6–33.0)
MCHC: 33.9 g/dL (ref 31.5–35.7)
MCV: 90 fL (ref 79–97)
Platelets: 125 10*3/uL — ABNORMAL LOW (ref 150–450)
RBC: 3.94 x10E6/uL — ABNORMAL LOW (ref 4.14–5.80)
RDW: 13.6 % (ref 11.6–15.4)
WBC: 4.7 10*3/uL (ref 3.4–10.8)

## 2021-03-30 LAB — HEMOGLOBIN A1C
Est. average glucose Bld gHb Est-mCnc: 197 mg/dL
Hgb A1c MFr Bld: 8.5 % — ABNORMAL HIGH (ref 4.8–5.6)

## 2021-03-30 LAB — TSH: TSH: 0.905 u[IU]/mL (ref 0.450–4.500)

## 2021-03-30 NOTE — Assessment & Plan Note (Signed)
Deterioration noted, efer to nephrology

## 2021-04-01 NOTE — Progress Notes (Signed)
This encounter was created in error - please disregard.

## 2021-04-05 ENCOUNTER — Encounter: Payer: Self-pay | Admitting: Family Medicine

## 2021-04-05 NOTE — Assessment & Plan Note (Signed)
DASH diet and commitment to daily physical activity for a minimum of 30 minutes discussed and encouraged, as a part of hypertension management. The importance of attaining a healthy weight is also discussed.  BP/Weight 03/29/2021 01/20/2021 01/20/2021 01/13/2021 11/23/2020 11/02/2020 0/98/1191  Systolic BP 478 295 621 308 657 846 962  Diastolic BP 82 64 69 77 86 78 64  Wt. (Lbs) 112 121.4 120.12 122.8 119.8 118 117.12  BMI 18.64 20.2 19.99 20.43 19.94 19.64 19.49

## 2021-04-05 NOTE — Assessment & Plan Note (Signed)
Mr. Chmiel is reminded of the importance of commitment to daily physical activity for 30 minutes or more, as able and the need to limit carbohydrate intake to 30 to 60 grams per meal to help with blood sugar control.   The need to take medication as prescribed, test blood sugar as directed, and to call between visits if there is a concern that blood sugar is uncontrolled is also discussed.   Mr. Vaden is reminded of the importance of daily foot exam, annual eye examination, and good blood sugar, blood pressure and cholesterol control. Improved bu still uncontrolled  F/u with Endo  Diabetic Labs Latest Ref Rng & Units 03/29/2021 11/01/2020 08/04/2020 07/14/2020 04/06/2020  HbA1c 4.8 - 5.6 % 8.5(H) 9.8(H) 10.7(H) 10.2(A) 10.5(A)  Microalbumin mg/L - - - 150 -  Micro/Creat Ratio 0 - 29 mg/g creat - - 366(H) - -  Chol 100 - 199 mg/dL - - 97(L) - -  HDL >39 mg/dL - - 50 - -  Calc LDL 0 - 99 mg/dL - - 36 - -  Triglycerides 0 - 149 mg/dL - - 40 - -  Creatinine 0.76 - 1.27 mg/dL 1.49(H) 1.27 1.35(H) - -   BP/Weight 03/29/2021 01/20/2021 01/20/2021 01/13/2021 11/23/2020 11/02/2020 2/95/2841  Systolic BP 324 401 027 253 664 403 474  Diastolic BP 82 64 69 77 86 78 64  Wt. (Lbs) 112 121.4 120.12 122.8 119.8 118 117.12  BMI 18.64 20.2 19.99 20.43 19.94 19.64 19.49   Foot/eye exam completion dates Latest Ref Rng & Units 02/27/2019 01/21/2018  Eye Exam No Retinopathy - -  Foot Form Completion - Done Done

## 2021-04-05 NOTE — Assessment & Plan Note (Signed)
Increase to trreating dose aricept and re eval in 8 weeks, olerating 5 mg dose with no adverse s/e

## 2021-04-18 ENCOUNTER — Telehealth: Payer: Self-pay | Admitting: Family Medicine

## 2021-04-18 NOTE — Telephone Encounter (Signed)
Please review encounter, Jacob Barnes is stating that a Referral to ENT , in [place] or closer for them  if not [facility] is fine    Please Call Jacob Barnes to advise

## 2021-04-21 NOTE — Telephone Encounter (Signed)
I don't see anything in the last note about a referral to ENT. Should he have been referred to ENT?

## 2021-04-26 ENCOUNTER — Other Ambulatory Visit: Payer: Self-pay | Admitting: "Endocrinology

## 2021-04-26 NOTE — Telephone Encounter (Signed)
Cannot reach [employer]. Did she state what this referral was for?

## 2021-04-27 NOTE — Telephone Encounter (Signed)
LVM for Devern to call with the reason for the ENT referral

## 2021-04-28 ENCOUNTER — Other Ambulatory Visit: Payer: Self-pay | Admitting: Family Medicine

## 2021-04-28 DIAGNOSIS — H6120 Impacted cerumen, unspecified ear: Secondary | ICD-10-CM

## 2021-04-28 DIAGNOSIS — H9190 Unspecified hearing loss, unspecified ear: Secondary | ICD-10-CM

## 2021-04-28 NOTE — Progress Notes (Signed)
Amb ent

## 2021-04-28 NOTE — Telephone Encounter (Signed)
Pt was complaining about the ear, Dr Moshe Cipro  lokoed at said it was wax build up, but Devern said he couldn't hear either...  So Dr Moshe Cipro suggested ENT   In [place] if possible   Call [place]. If not available leave a msg

## 2021-04-28 NOTE — Telephone Encounter (Signed)
Ok to refer to ENT in [place] for wax buildup and hearing loss

## 2021-05-02 ENCOUNTER — Encounter: Payer: Self-pay | Admitting: Family Medicine

## 2021-05-03 ENCOUNTER — Emergency Department (HOSPITAL_COMMUNITY): Payer: Medicare HMO

## 2021-05-03 ENCOUNTER — Encounter (HOSPITAL_COMMUNITY): Payer: Self-pay | Admitting: *Deleted

## 2021-05-03 ENCOUNTER — Inpatient Hospital Stay (HOSPITAL_COMMUNITY)
Admission: EM | Admit: 2021-05-03 | Discharge: 2021-05-09 | DRG: 964 | Disposition: A | Discharge status: Home Health | Payer: Medicare HMO | Attending: Surgery | Admitting: Surgery

## 2021-05-03 DIAGNOSIS — Z833 Family history of diabetes mellitus: Secondary | ICD-10-CM

## 2021-05-03 DIAGNOSIS — W11XXXA Fall on and from ladder, initial encounter: Secondary | ICD-10-CM | POA: Diagnosis present

## 2021-05-03 DIAGNOSIS — S32301A Unspecified fracture of right ilium, initial encounter for closed fracture: Secondary | ICD-10-CM | POA: Diagnosis present

## 2021-05-03 DIAGNOSIS — H401130 Primary open-angle glaucoma, bilateral, stage unspecified: Secondary | ICD-10-CM | POA: Diagnosis present

## 2021-05-03 DIAGNOSIS — K219 Gastro-esophageal reflux disease without esophagitis: Secondary | ICD-10-CM | POA: Diagnosis present

## 2021-05-03 DIAGNOSIS — Z79899 Other long term (current) drug therapy: Secondary | ICD-10-CM

## 2021-05-03 DIAGNOSIS — F0390 Unspecified dementia without behavioral disturbance: Secondary | ICD-10-CM | POA: Diagnosis present

## 2021-05-03 DIAGNOSIS — Z87442 Personal history of urinary calculi: Secondary | ICD-10-CM | POA: Diagnosis not present

## 2021-05-03 DIAGNOSIS — I129 Hypertensive chronic kidney disease with stage 1 through stage 4 chronic kidney disease, or unspecified chronic kidney disease: Secondary | ICD-10-CM | POA: Diagnosis present

## 2021-05-03 DIAGNOSIS — S36892A Contusion of other intra-abdominal organs, initial encounter: Secondary | ICD-10-CM | POA: Diagnosis not present

## 2021-05-03 DIAGNOSIS — D696 Thrombocytopenia, unspecified: Secondary | ICD-10-CM | POA: Diagnosis present

## 2021-05-03 DIAGNOSIS — Z20822 Contact with and (suspected) exposure to covid-19: Secondary | ICD-10-CM | POA: Diagnosis present

## 2021-05-03 DIAGNOSIS — E782 Mixed hyperlipidemia: Secondary | ICD-10-CM | POA: Diagnosis present

## 2021-05-03 DIAGNOSIS — S2231XA Fracture of one rib, right side, initial encounter for closed fracture: Secondary | ICD-10-CM | POA: Diagnosis present

## 2021-05-03 DIAGNOSIS — H04123 Dry eye syndrome of bilateral lacrimal glands: Secondary | ICD-10-CM | POA: Diagnosis present

## 2021-05-03 DIAGNOSIS — Z794 Long term (current) use of insulin: Secondary | ICD-10-CM | POA: Diagnosis not present

## 2021-05-03 DIAGNOSIS — Z8249 Family history of ischemic heart disease and other diseases of the circulatory system: Secondary | ICD-10-CM | POA: Diagnosis not present

## 2021-05-03 DIAGNOSIS — Z89021 Acquired absence of right finger(s): Secondary | ICD-10-CM

## 2021-05-03 DIAGNOSIS — Z9049 Acquired absence of other specified parts of digestive tract: Secondary | ICD-10-CM

## 2021-05-03 DIAGNOSIS — N1831 Chronic kidney disease, stage 3a: Secondary | ICD-10-CM | POA: Diagnosis present

## 2021-05-03 DIAGNOSIS — D62 Acute posthemorrhagic anemia: Secondary | ICD-10-CM | POA: Diagnosis present

## 2021-05-03 DIAGNOSIS — M8580 Other specified disorders of bone density and structure, unspecified site: Secondary | ICD-10-CM | POA: Diagnosis present

## 2021-05-03 DIAGNOSIS — S2249XA Multiple fractures of ribs, unspecified side, initial encounter for closed fracture: Secondary | ICD-10-CM

## 2021-05-03 DIAGNOSIS — E113513 Type 2 diabetes mellitus with proliferative diabetic retinopathy with macular edema, bilateral: Secondary | ICD-10-CM | POA: Diagnosis present

## 2021-05-03 LAB — CBC
HCT: 27.5 % — ABNORMAL LOW (ref 39.0–52.0)
Hemoglobin: 8.6 g/dL — ABNORMAL LOW (ref 13.0–17.0)
MCH: 30.6 pg (ref 26.0–34.0)
MCHC: 31.3 g/dL (ref 30.0–36.0)
MCV: 97.9 fL (ref 80.0–100.0)
Platelets: 87 10*3/uL — ABNORMAL LOW (ref 150–400)
RBC: 2.81 MIL/uL — ABNORMAL LOW (ref 4.22–5.81)
RDW: 15.9 % — ABNORMAL HIGH (ref 11.5–15.5)
WBC: 14.2 10*3/uL — ABNORMAL HIGH (ref 4.0–10.5)
nRBC: 0 % (ref 0.0–0.2)

## 2021-05-03 LAB — COMPREHENSIVE METABOLIC PANEL
ALT: 30 U/L (ref 0–44)
AST: 60 U/L — ABNORMAL HIGH (ref 15–41)
Albumin: 3.6 g/dL (ref 3.5–5.0)
Alkaline Phosphatase: 64 U/L (ref 38–126)
Anion gap: 9 (ref 5–15)
BUN: 33 mg/dL — ABNORMAL HIGH (ref 8–23)
CO2: 25 mmol/L (ref 22–32)
Calcium: 8.4 mg/dL — ABNORMAL LOW (ref 8.9–10.3)
Chloride: 102 mmol/L (ref 98–111)
Creatinine, Ser: 1.62 mg/dL — ABNORMAL HIGH (ref 0.61–1.24)
GFR, Estimated: 42 mL/min — ABNORMAL LOW (ref >60)
Glucose, Bld: 336 mg/dL — ABNORMAL HIGH (ref 70–99)
Potassium: 4.5 mmol/L (ref 3.5–5.1)
Sodium: 136 mmol/L (ref 135–145)
Total Bilirubin: 0.7 mg/dL (ref 0.3–1.2)
Total Protein: 7.5 g/dL (ref 6.5–8.1)

## 2021-05-03 LAB — CBC WITH DIFFERENTIAL/PLATELET
Basophils Absolute: 0 10*3/uL (ref 0.0–0.1)
Basophils Relative: 0 %
Eosinophils Absolute: 0 10*3/uL (ref 0.0–0.5)
Eosinophils Relative: 0 %
HCT: 27.8 % — ABNORMAL LOW (ref 39.0–52.0)
Hemoglobin: 9.1 g/dL — ABNORMAL LOW (ref 13.0–17.0)
Lymphocytes Relative: 13 %
Lymphs Abs: 2 10*3/uL (ref 0.7–4.0)
MCH: 31.9 pg (ref 26.0–34.0)
MCHC: 32.7 g/dL (ref 30.0–36.0)
MCV: 97.5 fL (ref 80.0–100.0)
Monocytes Absolute: 2 10*3/uL — ABNORMAL HIGH (ref 0.1–1.0)
Monocytes Relative: 13 %
Neutro Abs: 11.2 10*3/uL — ABNORMAL HIGH (ref 1.7–7.7)
Neutrophils Relative %: 74 %
Platelets: 108 10*3/uL — ABNORMAL LOW (ref 150–400)
RBC: 2.85 MIL/uL — ABNORMAL LOW (ref 4.22–5.81)
RDW: 15.9 % — ABNORMAL HIGH (ref 11.5–15.5)
WBC: 15.2 10*3/uL — ABNORMAL HIGH (ref 4.0–10.5)
nRBC: 0 % (ref 0.0–0.2)

## 2021-05-03 LAB — GLUCOSE, CAPILLARY: Glucose-Capillary: 228 mg/dL — ABNORMAL HIGH (ref 70–99)

## 2021-05-03 LAB — RESP PANEL BY RT-PCR (FLU A&B, COVID) ARPGX2
Influenza A by PCR: NEGATIVE
Influenza B by PCR: NEGATIVE
SARS Coronavirus 2 by RT PCR: NEGATIVE

## 2021-05-03 LAB — MRSA NEXT GEN BY PCR, NASAL: MRSA by PCR Next Gen: NOT DETECTED

## 2021-05-03 MED ORDER — ONDANSETRON 4 MG PO TBDP: 4.0000 mg | ORAL_TABLET | Freq: Four times a day (QID) | ORAL | Status: DC | PRN

## 2021-05-03 MED ORDER — IOHEXOL 300 MG/ML  SOLN
100.0000 mL | Freq: Once | INTRAMUSCULAR | Status: AC | PRN
Start: 2021-05-03 — End: 2021-05-03
  Administered 2021-05-03: 16:00:00 80 mL via INTRAVENOUS

## 2021-05-03 MED ORDER — SODIUM CHLORIDE 0.9 % IV BOLUS
1000.0000 mL | Freq: Once | INTRAVENOUS | Status: AC
  Administered 2021-05-03: 18:00:00 1000 mL via INTRAVENOUS

## 2021-05-03 MED ORDER — METHOCARBAMOL 500 MG PO TABS: 500.0000 mg | ORAL_TABLET | Freq: Four times a day (QID) | ORAL | Status: DC | PRN

## 2021-05-03 MED ORDER — CHLORHEXIDINE GLUCONATE CLOTH 2 % EX PADS
6.0000 | MEDICATED_PAD | Freq: Every day | CUTANEOUS | Status: DC
  Administered 2021-05-03 – 2021-05-08 (×5): 6 via TOPICAL

## 2021-05-03 MED ORDER — HYDROMORPHONE HCL 1 MG/ML IJ SOLN: 0.5000 mg | INTRAMUSCULAR | Status: DC | PRN

## 2021-05-03 MED ORDER — ONDANSETRON HCL 4 MG/2ML IJ SOLN: 4.0000 mg | Freq: Four times a day (QID) | INTRAMUSCULAR | Status: DC | PRN

## 2021-05-03 MED ORDER — HYDROCODONE-ACETAMINOPHEN 5-325 MG PO TABS
1.0000 | ORAL_TABLET | Freq: Once | ORAL | Status: AC
  Administered 2021-05-03: 15:00:00 1 via ORAL
  Filled 2021-05-03: qty 1

## 2021-05-03 MED ORDER — OXYCODONE HCL 5 MG PO TABS
5.0000 mg | ORAL_TABLET | ORAL | Status: DC | PRN
  Administered 2021-05-08: 5 mg via ORAL
  Filled 2021-05-03: qty 1

## 2021-05-03 MED ORDER — SODIUM CHLORIDE 0.9 % IV SOLN: INTRAVENOUS | Status: DC

## 2021-05-03 MED ORDER — INSULIN ASPART 100 UNIT/ML IJ SOLN
0.0000 [IU] | INTRAMUSCULAR | Status: DC
  Administered 2021-05-04: 20:00:00 1 [IU] via SUBCUTANEOUS
  Administered 2021-05-04 – 2021-05-05 (×2): 5 [IU] via SUBCUTANEOUS
  Administered 2021-05-05 (×2): 2 [IU] via SUBCUTANEOUS
  Administered 2021-05-05 (×2): 1 [IU] via SUBCUTANEOUS
  Administered 2021-05-05: 2 [IU] via SUBCUTANEOUS
  Administered 2021-05-06: 3 [IU] via SUBCUTANEOUS
  Administered 2021-05-06: 1 [IU] via SUBCUTANEOUS
  Administered 2021-05-06 (×2): 3 [IU] via SUBCUTANEOUS
  Administered 2021-05-07 (×2): 2 [IU] via SUBCUTANEOUS
  Administered 2021-05-07: 5 [IU] via SUBCUTANEOUS
  Administered 2021-05-07: 2 [IU] via SUBCUTANEOUS
  Administered 2021-05-07: 3 [IU] via SUBCUTANEOUS
  Administered 2021-05-08: 1 [IU] via SUBCUTANEOUS
  Administered 2021-05-08: 3 [IU] via SUBCUTANEOUS
  Administered 2021-05-08: 5 [IU] via SUBCUTANEOUS
  Administered 2021-05-08: 3 [IU] via SUBCUTANEOUS
  Administered 2021-05-08: 2 [IU] via SUBCUTANEOUS
  Administered 2021-05-09 (×2): 1 [IU] via SUBCUTANEOUS

## 2021-05-03 MED ORDER — ACETAMINOPHEN 325 MG PO TABS
650.0000 mg | ORAL_TABLET | Freq: Four times a day (QID) | ORAL | Status: DC
  Administered 2021-05-03 – 2021-05-09 (×22): 650 mg via ORAL
  Filled 2021-05-03 (×22): qty 2

## 2021-05-03 MED ORDER — DOCUSATE SODIUM 100 MG PO CAPS
100.0000 mg | ORAL_CAPSULE | Freq: Two times a day (BID) | ORAL | Status: DC
  Administered 2021-05-03 – 2021-05-09 (×11): 100 mg via ORAL
  Filled 2021-05-03 (×10): qty 1

## 2021-05-03 NOTE — ED Notes (Signed)
Report given to [facility] 

## 2021-05-03 NOTE — ED Provider Notes (Signed)
5:46 PM I discussed the patient CT results with our radiologist and subsequently with our trauma surgeon, Dr. Windle Guard.  Patient is accepted in transfer to [facility], emergency department to emergency department transfer.  With concern for iliac fracture, rib fracture, pelvic hematoma, questionable extravasation patient will require this for facilitation of care.  He and his wife are aware of this, agree with plan.   Carmin Muskrat, MD 05/03/21 1747

## 2021-05-03 NOTE — ED Notes (Signed)
ED Provider at bedside. 

## 2021-05-03 NOTE — ED Notes (Signed)
Patient transported to CT 

## 2021-05-03 NOTE — H&P (Signed)
Surgical Evaluation  Chief Complaint: fall  HPI: Incredibly pleasant 81 year old man who sustained a fall around 4:00 in the morning today about 3 feet from a stepladder, landing on a cement floor.  He has been able to get up and walk around, but with significant pain and requiring a lot of assistance.  Due to ongoing pain, he did present to the [facility] emergency room around 2:00 in the afternoon for evaluation.  Describes pain along the right side of his torso, but states that it is better now than it had been.  Denies hitting his head or loss of consciousness.  No anticoagulants.  Work-up included plain films of the ribs and right hip well as a CT of the chest abdomen pelvis, with findings below most significantly iliac fracture with retroperitoneal hematoma and possible extravasation.  He has been transferred to [facility] for evaluation by the trauma service. His son is at the bedside this evening.  Allergies  Allergen Reactions   No Known Allergies     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   Anemia    Arthritis    hands   Carpal tunnel syndrome 08/05/2009   Qualifier: Diagnosis of  By: Aline Brochure MD, [name]     Chronic kidney disease    Diabetes mellitus    says since 1979 type 2   Diabetes mellitus without complication ([place])    Phreesia 04/03/2020   ERECTILE DYSFUNCTION, ORGANIC 01/20/2009   Qualifier: Diagnosis of  By: Moshe Cipro MD, Margaret     GERD (gastroesophageal reflux disease)    Glaucoma    both eyes   History of kidney stones    Hyperlipemia    Hypertension    Iron deficiency anemia 01/10/2014   Other pancytopenia ([place]) 01/10/2014   New in 01/2014, undergoing pathology review    Rash    front abdomen no drainage    Past Surgical History:  Procedure Laterality Date   AMPUTATION Right 09/01/2016   Procedure: AMPUTATION RIGHT MIDDLE FINGER TIP;   Surgeon: Mcarthur Rossetti, MD;  Location: [facility];  Service: Orthopedics;  Laterality: Right;   AMPUTATION Right 10/10/2016   Procedure: REPEAT IRRIGATION AND DEBRIDEMENT RIGHT MIDDLE FINGER WITH AMPUTATION THROUGH PROXIMAL PHALANX;  Surgeon: Mcarthur Rossetti, MD;  Location: [facility];  Service: Orthopedics;  Laterality: Right;   APPENDECTOMY     COLONOSCOPY  09/06/2011   Procedure: COLONOSCOPY;  Surgeon: Rogene Houston, MD;  Location: [facility];  Service: Endoscopy;  Laterality: N/A;  830   CYSTOSCOPY/RETROGRADE/URETEROSCOPY/STONE EXTRACTION WITH BASKET     ESOPHAGOGASTRODUODENOSCOPY N/A 01/15/2014   Procedure: ESOPHAGOGASTRODUODENOSCOPY (EGD);  Surgeon: Rogene Houston, MD;  Location: [facility];  Service: Endoscopy;  Laterality: N/A;  200   ESOPHAGOGASTRODUODENOSCOPY N/A 04/22/2014   Procedure: ESOPHAGOGASTRODUODENOSCOPY (EGD);  Surgeon: Rogene Houston, MD;  Location: [facility];  Service: Endoscopy;  Laterality: N/A;  240   EYE SURGERY Bilateral yrs ago   ioc for cataract   MALONEY DILATION N/A 01/15/2014   Procedure: MALONEY DILATION;  Surgeon: Rogene Houston, MD;  [facility];  Service: Endoscopy;  Laterality: N/A;   MALONEY DILATION  04/22/2014   Procedure: Venia Minks DILATION;  Surgeon: Rogene Houston, MD;  Location[facility];  Service: Endoscopy;;   surgery for left eye bleedf  2017    Family History  Problem Relation Age of Onset   Diabetes Mother    Hypertension Mother    Hyperlipidemia Mother    Stroke Mother    Heart failure Mother    Diabetes Father    Heart disease Father    Hypertension Father    Hyperlipidemia Father    Heart attack Father    Diabetes Brother    Diabetes Brother    Diabetes Son     Social History   Socioeconomic History   Marital status: Married    Spouse name: Not on file   Number of children: Not on file   Years of education: Not on file   Highest education level: Not on file  Occupational History   Not on  file  Tobacco Use   Smoking status: Never   Smokeless tobacco: Never  Vaping Use   Vaping Use: Never used  Substance and Sexual Activity   Alcohol use: No   Drug use: No   Sexual activity: Yes  Other Topics Concern   Not on file  Social History Narrative   Not on file   Social Determinants of Health   Financial Resource Strain: Not on file  Food Insecurity: Not on file  Transportation Needs: Not on file  Physical Activity: Not on file  Stress: Not on file  Social Connections: Not on file    No current facility-administered medications on file prior to encounter.   Current Outpatient Medications on File Prior to Encounter  Medication Sig Dispense Refill   benazepril (LOTENSIN) 20 MG tablet Take 1 tablet (20 mg total) by mouth daily. 90 tablet 3   blood glucose meter kit and supplies Dispense based on patient and insurance preference. Use up to three times daily as directed. (dx E11.65). 1 each 0   Calcium Carbonate (CALCIUM 600 PO) Take 1 tablet by mouth daily.     Cholecalciferol (VITAMIN D3 PO) Take 4,000 Units by mouth daily.     Continuous Blood Gluc Receiver (FREESTYLE LIBRE 2 READER) DEVI 1 each by Does not apply route in the morning, at noon, in the evening, and at bedtime. Use to test BG qid 1 each 0   Continuous Blood Gluc Sensor (FREESTYLE LIBRE 2 SENSOR) MISC 1 each by Does not apply route every 14 (fourteen) days. 2 each 5   donepezil (ARICEPT) 10 MG tablet Take 1 tablet (10 mg total) by mouth at bedtime. 30 tablet 3   dorzolamide-timolol (COSOPT) 22.3-6.8 MG/ML ophthalmic solution 1 drop 2 (two) times daily.     insulin degludec (TRESIBA FLEXTOUCH) 100 UNIT/ML FlexTouch Pen Inject 10 Units into the skin at bedtime. 9 mL 3   Insulin Pen Needle (PEN NEEDLES) 31G X 6 MM MISC 1 each by Does not apply route at bedtime. 100 each 2   Iron, Ferrous Sulfate, 325 (65 Fe) MG TABS Take one tablet by mouth once daily 90 tablet 3   latanoprost (XALATAN) 0.005 % ophthalmic  solution      ONETOUCH VERIO test strip USE AS DIRECTED UP TO 4 TIMES DAILY 100 strip 2   pantoprazole (PROTONIX) 40 MG tablet Take 1 tablet (40 mg total) by mouth daily. 30 tablet 3   Probiotic Product (PROBIOTIC DAILY PO) Take by mouth daily.     rosuvastatin (CRESTOR) 5 MG tablet Take one tablet by mouth every Monday , Wednesday and Friday 36 tablet 3   TRADJENTA 5 MG TABS tablet TAKE 1 TABLET BY MOUTH EVERY DAY 90 tablet 0    Review  of Systems: a complete, 10pt review of systems was completed with pertinent positives and negatives as documented in the HPI  Physical Exam: Vitals:   05/03/21 1730 05/03/21 1800  BP: 111/61 126/69  Pulse: 71 83  Resp: 18 13  Temp:    SpO2: 100% 100%   Gen: A&Ox3, no distress  Eyes: lids and conjunctivae normal, no icterus. Pupils equally round and reactive to light.  Neck: supple without mass or thyromegaly.  Trachea midline, no crepitus or hematoma Chest: respiratory effort is normal. No crepitus or tenderness on palpation of the chest. Breath sounds equal.  Cardiovascular: RRR with palpable distal pulses, nonpitting mild BLE edema Gastrointestinal: soft, nondistended, nontender. No mass, hepatomegaly or splenomegaly.  Muscoloskeletal: no clubbing or cyanosis of the fingers.  Tenderness over right iliac and flank.  Neuro: cranial nerves grossly intact.  Sensation intact to light touch diffusely. Psych: appropriate mood and affect, normal insight/judgment intact  Skin: warm and dry   CBC Latest Ref Rng & Units 05/03/2021 03/29/2021 08/04/2020  WBC 4.0 - 10.5 K/uL 15.2(H) 4.7 3.9  Hemoglobin 13.0 - 17.0 g/dL 9.1(L) 12.0(L) 13.3  Hematocrit 39.0 - 52.0 % 27.8(L) 35.4(L) 40.0  Platelets 150 - 400 K/uL 108(L) 125(L) 137(L)    CMP Latest Ref Rng & Units 05/03/2021 03/29/2021 11/01/2020  Glucose 70 - 99 mg/dL 336(H) 216(H) 126(H)  BUN 8 - 23 mg/dL 33(H) 17 16  Creatinine 0.61 - 1.24 mg/dL 1.62(H) 1.49(H) 1.27  Sodium 135 - 145 mmol/L 136 137 138   Potassium 3.5 - 5.1 mmol/L 4.5 4.3 4.0  Chloride 98 - 111 mmol/L 102 100 98  CO2 22 - 32 mmol/L '25 27 27  ' Calcium 8.9 - 10.3 mg/dL 8.4(L) 9.0 9.1  Total Protein 6.5 - 8.1 g/dL 7.5 7.9 -  Total Bilirubin 0.3 - 1.2 mg/dL 0.7 0.4 -  Alkaline Phos 38 - 126 U/L 64 89 -  AST 15 - 41 U/L 60(H) 27 -  ALT 0 - 44 U/L 30 11 -    No results found for: INR, PROTIME  Imaging: DG Ribs Unilateral W/Chest Right  Result Date: 05/03/2021 CLINICAL DATA:  Right rib pain after fall. EXAM: RIGHT RIBS AND CHEST - 3+ VIEW COMPARISON:  December 11, 2013. FINDINGS: Probable mildly displaced fracture is seen involving anterior portion of right tenth rib. There is no evidence of pneumothorax or pleural effusion. Both lungs are clear. Heart size and mediastinal contours are within normal limits. IMPRESSION: Probable mildly displaced right tenth rib fracture. Electronically Signed   By: Marijo Conception M.D.   On: 05/03/2021 14:27   CT CHEST ABDOMEN PELVIS W CONTRAST  Result Date: 05/03/2021 CLINICAL DATA:  Trauma, fall EXAM: CT CHEST, ABDOMEN, AND PELVIS WITH CONTRAST TECHNIQUE: Multidetector CT imaging of the chest, abdomen and pelvis was performed following the standard protocol during bolus administration of intravenous contrast. RADIATION DOSE REDUCTION: This exam was performed according to the departmental dose-optimization program which includes automated exposure control, adjustment of the mA and/or kV according to patient size and/or use of iterative reconstruction technique. CONTRAST:  64m OMNIPAQUE IOHEXOL 300 MG/ML  SOLN COMPARISON:  None. FINDINGS: CT CHEST FINDINGS Cardiovascular: Coronary artery calcifications are seen. There is homogeneous enhancement in thoracic aorta. There is ectasia of ascending thoracic aorta measuring 3.4 cm. There are no filling defects in central pulmonary arteries. Mediastinum/Nodes: There is no evidence of mediastinal hematoma. Lungs/Pleura: Centrilobular emphysema is seen. There is  no focal pulmonary consolidation. There is slight increase in interstitial markings  in the lower lung fields. Few blebs and bullae are seen in the periphery of right lower lung fields. CT ABDOMEN PELVIS FINDINGS Hepatobiliary: There is no demonstrable laceration. Gallbladder is unremarkable. Pancreas: There is prominence of pancreatic duct. No focal abnormality is seen. Spleen: Unremarkable. Adrenals/Urinary Tract: Adrenals are not enlarged. There is no hydronephrosis. There is no demonstrable cortical laceration. Possible subcentimeter cysts are seen in the lower pole of right kidney. There are no renal or ureteral stones. Urinary bladder is unremarkable. Stomach/Bowel: Small hiatal hernia is seen. Small bowel loops are not dilated. Appendix is not dilated. Appendix is seen in the right pelvic cavity. There is no significant wall thickening in the colon. Vascular/Lymphatic: Aorta is of normal caliber. There is no active extravasation of contrast from aorta or its major branches. Reproductive: There is inhomogeneous attenuation and coarse calcifications in the prostate and seminal vesicles. Other: There is severely comminuted fracture in the right iliac bone. There is mild displacement of fracture fragments. There is large hematoma in the right iliac fossa measuring approximately 8.9 x 4.7 cm. There are small linear high density foci in the anterior aspect of right iliacus. Possibility of active extravasation of contrast in the right iliac fossa is not excluded. Inferior course of right iliacus muscle is thickened, possibly due to extension of hematoma. Right gluteal muscles appear more prominent than left suggesting possible contusion/hematoma. There is small amount of free fluid in the pelvis. There is moderate sized right perinephric fluid collection extending to the right lower quadrant. There is no pneumoperitoneum. Musculoskeletal: Comminuted displaced fracture is seen in the right iliac bone. There is marked  compression of body of L1 vertebra. There are pockets of air in the disc space at T12-L1 level. Findings may suggest old compression fracture. Possibility of re-injury is not excluded. Degenerative changes are noted in the lower cervical spine and lower lumbar spine. There is minimal cortical irregularity in the anterior end of right first rib. There is mild deformity of in the cortical margin in the anterior right eighth rib suggesting old healed fracture. IMPRESSION: There is severely comminuted displaced fracture in the right iliac bone. There is large hematoma in the right iliac fossa. There are small linear high density foci in the anterior aspect of right iliacus which may suggest possible active extravasation of contrast due to vascular injury. There is moderate sized right perinephric hematoma extending to the right iliac fossa. Small ascites is seen in the pelvic cavity. Right gluteal muscles appear larger in size, possibly suggesting hematoma in the right gluteal region. There is no demonstrable laceration in the solid organs. There is no evidence mediastinal hematoma. There is no demonstrable para-aortic hematoma in the abdomen. There is cortical irregularity in the anterior right first rib which may suggest recent or old injury. There is evidence of old healed fracture in the right eighth rib in the anterior aspect. There is marked compression of body of L1 vertebra with sclerosis. This most likely suggests old compression fracture. Please correlate with clinical findings to rule out re-injury at the site of old fracture. Coronary artery disease.  COPD.  Small hiatal hernia. Imaging finding of possible active extravasation of contrast in the right iliac fossa was relayed to Dr. Vanita Panda by telephone call. Electronically Signed   By: Elmer Picker M.D.   On: 05/03/2021 17:10   DG Hip Unilat W or Wo Pelvis 2-3 Views Right  Result Date: 05/03/2021 CLINICAL DATA:  Fall off ladder today with right  hip pain.  Unable to bear weight. EXAM: DG HIP (WITH OR WITHOUT PELVIS) 2-3V RIGHT COMPARISON:  None. FINDINGS: Comminuted fracture of the right iliac bone extends from the iliac crest to the lateral aspect of the acetabulum. The fracture is comminuted and displaced. There is no extension to the articular surface. Fracture displacement is 4-5 mm. Femoral head is well seated in the acetabulum with mild symmetric disc space narrowing and degenerative change. Intact pubic rami. Pubic symphysis and sacroiliac joints are congruent. IMPRESSION: Comminuted and displaced right iliac bone fracture, extending adjacent to the right acetabulum, but no definite articular involvement. Electronically Signed   By: Keith Rake M.D.   On: 05/03/2021 14:54     A/P: 81 year old man status post fall R 10th rib fx on x-ray only, not mentioned on CT-pulmonary toilet, multimodal pain control, repeat chest x-ray in the morning  R iliac fracture, severely comminuted with large retroperitoneal hematoma, 8.9 x 4.7 cm with small linear high density foci, cannot exclude active extravasation, some contusion to the gluteus muscle,, moderate size right perinephric fluid collection extending down to the right lower quadrant, small amount of free fluid in the pelvis  Given that the injury happened over 12 hours ago, and CT images were obtained about 3 hours ago, and that the patient has been completely hemodynamically stable in the interim presenting here with a heart rate of 81 and a blood pressure of 122/76, doubt there is ongoing active bleeding.  We will recheck CBC now and serially throughout the night.  Orthopedic surgery consult- spoke w Dr. Lucia Gaskins 19:30, unlikely to require surgery but will review w ortho trauma in AM. Can remove the sheet.  Old L1 vertebral compression fracture, cannot exclude reinjury, old rib fractures, no mention of the above 10th rib fracture on CT  Coronary artery disease, COPD, small hiatal  hernia  Resume home meds after reconciliation.  Patient Active Problem List   Diagnosis Date Noted   Dementia ([place]) 01/24/2021   Frequent stools 06/18/2020   At high risk for falls 04/09/2020   Uncontrolled type 2 diabetes mellitus with hyperglycemia ([place]) 01/28/2020   Osteopenia 01/28/2020   Compression fracture of L1 lumbar vertebra ([place]) 01/09/2020   After-cataract with vision obscured 02/10/2019   Diabetic macular edema ([place]) 02/10/2019   Dry eye syndrome of bilateral lacrimal glands 02/10/2019   Lens replaced by other means 02/10/2019   Open-angle glaucoma 02/10/2019   Presence of intraocular lens 02/10/2019   Unspecified visual loss 02/10/2019   Type 2 diabetes mellitus with proliferative retinopathy of both eyes and macular edema ([place]) 02/10/2019   Type 2 diabetes mellitus with vascular disease ([place]) 10/20/2018   Bilateral pseudophakia 02/08/2016   Proliferative retinopathy with retinal edema due to type 2 diabetes mellitus ([place]) 02/08/2016   Primary open angle glaucoma of both eyes 02/08/2016   CKD (chronic kidney disease) stage 3, GFR 30-59 ml/min ([facility]) 12/15/2013   Mild protein-calorie malnutrition ([place]) 12/11/2013   Mixed hyperlipidemia 07/16/2013   GERD (gastroesophageal reflux disease) 06/27/2012   Background diabetic retinopathy ([place]) 03/28/2012   Retinal edema 03/28/2012   Tear film insufficiency 08/07/2011   Borderline glaucoma, open angle with borderline findings 03/22/2011   Essential hypertension, benign 01/02/2009   Type 2 or unspecified type diabetes mellitus, uncontrolled 12/21/2008       Romana Juniper, MD [facility]  See AMION to contact appropriate on-call provider

## 2021-05-03 NOTE — ED Triage Notes (Signed)
[place] off a step ladder and hit a cement floor. Pain in right lateral rib cage

## 2021-05-03 NOTE — ED Provider Notes (Signed)
°  Physical Exam  BP 111/80    Pulse 83    Temp 98.9 F (37.2 C) (Oral)    Resp 19    SpO2 100%   Physical Exam Vitals and nursing note reviewed.  Constitutional:      Appearance: Normal appearance.  HENT:     Head: Normocephalic and atraumatic.  Eyes:     Conjunctiva/sclera: Conjunctivae normal.  Cardiovascular:     Rate and Rhythm: Normal rate and regular rhythm.  Pulmonary:     Effort: Pulmonary effort is normal. No respiratory distress.     Breath sounds: Normal breath sounds.  Chest:     Comments: TTP of right chest wall and right hip Abdominal:     General: There is no distension.     Palpations: Abdomen is soft.     Tenderness: There is no abdominal tenderness.  Skin:    General: Skin is warm and dry.  Neurological:     General: No focal deficit present.     Mental Status: He is alert.    Procedures  Procedures  ED Course / MDM    Medical Decision Making Amount and/or Complexity of Data Reviewed Labs: ordered. Radiology: ordered.  Risk Prescription drug management. Decision regarding hospitalization.   Patient is 81 y/o male who presents to the [facility] ER from [facility] ER after a fall. CT performed at [facility] showed  iliac fracture, rib fracture, pelvic hematoma and questionable extravasation.  Patient states he fell earlier today after tripping on something, and landing on his right side. No head trauma or LOC. He is not on chronic anticoagulation.  Patient admitted to the ICU. The patient appears reasonably stabilized for admission considering the current resources, flow, and capabilities available in the ED at this time, and I doubt any other [facility] requiring further screening and/or treatment in the ED prior to admission.   Estill Cotta 05/03/21 1949    Lacretia Leigh, MD 05/04/21 1003

## 2021-05-03 NOTE — ED Notes (Signed)
Heat packs applied to the left arm.

## 2021-05-03 NOTE — ED Notes (Signed)
Transfer from [facility]. Pt was on a ladder yesterday morning around 3am. Pt fell about 30ft onto concrete. No LOC, no blood thinners. Pt transferred for Trauma consult. Pt is A&Ox4.   109/62 76 heart rate 98%. Room air.

## 2021-05-03 NOTE — ED Provider Notes (Signed)
[facility] EMERGENCY DEPARTMENT Provider Note   CSN: [id number] Arrival date & time: 05/03/21  1349     History  Chief Complaint  Patient presents with   Jacob Barnes is a 81 y.o. male.  HPI 81 year old male presents after a fall and right-sided injury.  History is from patient and wife at the bedside.  Patient was on a ladder and as he was coming down he thinks he got his foot caught up and fell landing on his right side on the cement.  Did not hit his head or lose consciousness.  Is having severe pain in his right chest/back as well as his right lateral hip.  He was able to get up and walk but it required a lot of assistance.  He is in a lot of pain.  He is not on a blood thinner.  He did not lose consciousness and had no syncope/near syncope.  Home Medications Prior to Admission medications   Medication Sig Start Date End Date Taking? Authorizing Provider  benazepril (LOTENSIN) 20 MG tablet Take 1 tablet (20 mg total) by mouth daily. 08/03/20   Noreene Larsson, NP  blood glucose meter kit and supplies Dispense based on patient and insurance preference. Use up to three times daily as directed. (dx E11.65). 09/21/16   Fayrene Helper, MD  Calcium Carbonate (CALCIUM 600 PO) Take 1 tablet by mouth daily.    [provider]  Cholecalciferol (VITAMIN D3 PO) Take 4,000 Units by mouth daily.    [provider]  Continuous Blood Gluc Receiver (FREESTYLE LIBRE 2 READER) DEVI 1 each by Does not apply route in the morning, at noon, in the evening, and at bedtime. Use to test BG qid 12/06/20   Cassandria Anger, MD  Continuous Blood Gluc Sensor (FREESTYLE LIBRE 2 SENSOR) MISC 1 each by Does not apply route every 14 (fourteen) days. 12/06/20   Cassandria Anger, MD  donepezil (ARICEPT) 10 MG tablet Take 1 tablet (10 mg total) by mouth at bedtime. 03/29/21   Fayrene Helper, MD  dorzolamide-timolol (COSOPT) 22.3-6.8 MG/ML ophthalmic solution 1 drop 2 (two) times  daily. 07/07/20   [provider]  insulin degludec (TRESIBA FLEXTOUCH) 100 UNIT/ML FlexTouch Pen Inject 10 Units into the skin at bedtime. 07/14/20   Brita Romp, NP  Insulin Pen Needle (PEN NEEDLES) 31G X 6 MM MISC 1 each by Does not apply route at bedtime. 09/20/20   Cassandria Anger, MD  Iron, Ferrous Sulfate, 325 (65 Fe) MG TABS Take one tablet by mouth once daily 12/17/20   Fayrene Helper, MD  latanoprost Ivin Poot) 0.005 % ophthalmic solution  01/10/19   [provider]  [facility] VERIO test strip USE AS DIRECTED UP TO 4 TIMES DAILY 01/12/21   Fayrene Helper, MD  pantoprazole (PROTONIX) 40 MG tablet Take 1 tablet (40 mg total) by mouth daily. 03/29/21   Fayrene Helper, MD  Probiotic Product (PROBIOTIC DAILY PO) Take by mouth daily.    [provider]  rosuvastatin (CRESTOR) 5 MG tablet Take one tablet by mouth every Monday , Wednesday and Friday 12/07/20   Fayrene Helper, MD  TRADJENTA 5 MG TABS tablet TAKE 1 TABLET BY MOUTH EVERY DAY 04/26/21   Cassandria Anger, MD      Allergies    No known allergies    Review of Systems   Review of Systems  Cardiovascular:  Positive for chest pain.  Gastrointestinal:  Positive for abdominal pain.  Musculoskeletal:  Positive for arthralgias and back pain.  Neurological:  Negative for dizziness, syncope and headaches.   Physical Exam Updated Vital Signs BP 117/69 (BP Location: Right Arm)    Pulse 85    Temp 98.6 F (37 C) (Oral)    Resp 18    SpO2 96%  Physical Exam Vitals and nursing note reviewed.  Constitutional:      Appearance: He is well-developed.  HENT:     Head: Normocephalic and atraumatic.  Cardiovascular:     Rate and Rhythm: Normal rate and regular rhythm.     Heart sounds: Normal heart sounds.  Pulmonary:     Effort: Pulmonary effort is normal.     Breath sounds: Normal breath sounds.  Chest:     Chest wall: Tenderness (diffuse along right lateral and posterior chest)  present.  Abdominal:     Palpations: Abdomen is soft.     Tenderness: There is abdominal tenderness in the right upper quadrant and right lower quadrant.  Musculoskeletal:     Right hip: Tenderness (lateral) present. No deformity. Decreased range of motion (painful).     Right upper leg: No tenderness.  Skin:    General: Skin is warm and dry.  Neurological:     Mental Status: He is alert.    ED Results / Procedures / Treatments   Labs (all labs ordered are listed, but only abnormal results are displayed) Labs Reviewed  CBC WITH DIFFERENTIAL/PLATELET  COMPREHENSIVE METABOLIC PANEL    EKG None  Radiology No results found.  Procedures Procedures    Medications Ordered in ED Medications  HYDROcodone-acetaminophen (NORCO/VICODIN) 5-325 MG per tablet 1 tablet (has no administration in time range)    ED Course/ Medical Decision Making/ A&P                           Medical Decision Making Amount and/or Complexity of Data Reviewed Labs: ordered. Radiology: ordered.  Risk Prescription drug management.   Patient is hemodynamically stable.  However he is in a good amount of pain so was given hydrocodone.  Chest x-ray was reviewed/interpreted and shows a rib fracture but no pneumothorax.  His pelvis fracture shows a significant iliac crest fracture.  No obvious hip fracture.  Given his abdominal pain he will need a CT abdomen and pelvis and we will also get chest because of the ribs.  Care transferred to Dr. Vanita Panda.  Final disposition pending.        Final Clinical Impression(s) / ED Diagnoses Final diagnoses:  None    Rx / DC Orders ED Discharge Orders     None         Sherwood Gambler, MD 05/03/21 1615

## 2021-05-03 NOTE — ED Notes (Signed)
Pt placed on cardiac monitor 

## 2021-05-04 ENCOUNTER — Inpatient Hospital Stay (HOSPITAL_COMMUNITY): Payer: Medicare HMO

## 2021-05-04 LAB — CBC
HCT: 21.7 % — ABNORMAL LOW (ref 39.0–52.0)
HCT: 22 % — ABNORMAL LOW (ref 39.0–52.0)
HCT: 24.7 % — ABNORMAL LOW (ref 39.0–52.0)
Hemoglobin: 7.2 g/dL — ABNORMAL LOW (ref 13.0–17.0)
Hemoglobin: 7.4 g/dL — ABNORMAL LOW (ref 13.0–17.0)
Hemoglobin: 8.2 g/dL — ABNORMAL LOW (ref 13.0–17.0)
MCH: 31.2 pg (ref 26.0–34.0)
MCH: 31.6 pg (ref 26.0–34.0)
MCH: 31.2 pg (ref 26.0–34.0)
MCHC: 33.2 g/dL (ref 30.0–36.0)
MCHC: 33.6 g/dL (ref 30.0–36.0)
MCHC: 33.2 g/dL (ref 30.0–36.0)
MCV: 93.9 fL (ref 80.0–100.0)
MCV: 94 fL (ref 80.0–100.0)
MCV: 93.9 fL (ref 80.0–100.0)
Platelets: 89 10*3/uL — ABNORMAL LOW (ref 150–400)
Platelets: 87 10*3/uL — ABNORMAL LOW (ref 150–400)
Platelets: 92 10*3/uL — ABNORMAL LOW (ref 150–400)
RBC: 2.31 MIL/uL — ABNORMAL LOW (ref 4.22–5.81)
RBC: 2.34 MIL/uL — ABNORMAL LOW (ref 4.22–5.81)
RBC: 2.63 MIL/uL — ABNORMAL LOW (ref 4.22–5.81)
RDW: 16 % — ABNORMAL HIGH (ref 11.5–15.5)
RDW: 15.9 % — ABNORMAL HIGH (ref 11.5–15.5)
RDW: 16 % — ABNORMAL HIGH (ref 11.5–15.5)
WBC: 17.2 10*3/uL — ABNORMAL HIGH (ref 4.0–10.5)
WBC: 15.1 10*3/uL — ABNORMAL HIGH (ref 4.0–10.5)
WBC: 12 10*3/uL — ABNORMAL HIGH (ref 4.0–10.5)
nRBC: 0 % (ref 0.0–0.2)
nRBC: 0 % (ref 0.0–0.2)
nRBC: 0 % (ref 0.0–0.2)

## 2021-05-04 LAB — GLUCOSE, CAPILLARY
Glucose-Capillary: 100 mg/dL — ABNORMAL HIGH (ref 70–99)
Glucose-Capillary: 129 mg/dL — ABNORMAL HIGH (ref 70–99)
Glucose-Capillary: 167 mg/dL — ABNORMAL HIGH (ref 70–99)
Glucose-Capillary: 254 mg/dL — ABNORMAL HIGH (ref 70–99)
Glucose-Capillary: 79 mg/dL (ref 70–99)
Glucose-Capillary: 83 mg/dL (ref 70–99)

## 2021-05-04 LAB — BASIC METABOLIC PANEL
Anion gap: 8 (ref 5–15)
BUN: 31 mg/dL — ABNORMAL HIGH (ref 8–23)
CO2: 22 mmol/L (ref 22–32)
Calcium: 8.2 mg/dL — ABNORMAL LOW (ref 8.9–10.3)
Chloride: 109 mmol/L (ref 98–111)
Creatinine, Ser: 1.54 mg/dL — ABNORMAL HIGH (ref 0.61–1.24)
GFR, Estimated: 45 mL/min — ABNORMAL LOW (ref >60)
Glucose, Bld: 86 mg/dL (ref 70–99)
Potassium: 3.9 mmol/L (ref 3.5–5.1)
Sodium: 139 mmol/L (ref 135–145)

## 2021-05-04 MED ORDER — DORZOLAMIDE HCL-TIMOLOL MAL 2-0.5 % OP SOLN
1.0000 [drp] | Freq: Two times a day (BID) | OPHTHALMIC | Status: DC
  Administered 2021-05-04 – 2021-05-09 (×11): 1 [drp] via OPHTHALMIC
  Filled 2021-05-04 (×2): qty 10

## 2021-05-04 MED ORDER — DONEPEZIL HCL 10 MG PO TABS
10.0000 mg | ORAL_TABLET | Freq: Every day | ORAL | Status: DC
  Administered 2021-05-04 – 2021-05-08 (×5): 10 mg via ORAL
  Filled 2021-05-04 (×5): qty 1

## 2021-05-04 MED ORDER — LATANOPROST 0.005 % OP SOLN
1.0000 [drp] | Freq: Every day | OPHTHALMIC | Status: DC
  Administered 2021-05-05 – 2021-05-08 (×5): 1 [drp] via OPHTHALMIC
  Filled 2021-05-04: qty 2.5

## 2021-05-04 NOTE — Progress Notes (Signed)
Patient ID: Jacob Barnes, male   DOB: 11-24-1940, 81 y.o.   MRN: [id number] Follow up - Trauma Critical Care   Patient Details:    Jacob Barnes is an 81 y.o. male.  Lines/tubes :   Microbiology/Sepsis markers: Results for orders placed or performed during the hospital encounter of 05/03/21  Resp Panel by RT-PCR (Flu A&B, Covid) Nasopharyngeal Swab     Status: None   Collection Time: 05/03/21  5:55 PM   Specimen: Nasopharyngeal Swab; Nasopharyngeal(NP) swabs in vial transport medium  Result Value Ref Range Status   SARS Coronavirus 2 by RT PCR NEGATIVE NEGATIVE Final    Comment: (NOTE) SARS-CoV-2 target nucleic acids are NOT DETECTED.  The SARS-CoV-2 RNA is generally detectable in upper respiratory specimens during the acute phase of infection. The lowest concentration of SARS-CoV-2 viral copies this assay can detect is 138 copies/mL. A negative result does not preclude SARS-Cov-2 infection and should not be used as the sole basis for treatment or other patient management decisions. A negative result may occur with  improper specimen collection/handling, submission of specimen other than nasopharyngeal swab, presence of viral mutation(s) within the areas targeted by this assay, and inadequate number of viral copies(<138 copies/mL). A negative result must be combined with clinical observations, patient history, and epidemiological information. The expected result is Negative.  Fact Sheet for Patients:  EntrepreneurPulse.com.au  Fact Sheet for Healthcare Providers:  IncredibleEmployment.be  This test is no t yet approved or cleared by the [place] FDA and  has been authorized for detection and/or diagnosis of SARS-CoV-2 by FDA under an Emergency Use Authorization (EUA). This EUA will remain  in effect (meaning this test can be used) for the duration of the COVID-19 declaration under Section 564(b)(1) of the Act, 21 U.S.C.section  360bbb-3(b)(1), unless the authorization is terminated  or revoked sooner.       Influenza A by PCR NEGATIVE NEGATIVE Final   Influenza B by PCR NEGATIVE NEGATIVE Final    Comment: (NOTE) The Xpert Xpress SARS-CoV-2/FLU/RSV plus assay is intended as an aid in the diagnosis of influenza from Nasopharyngeal swab specimens and should not be used as a sole basis for treatment. Nasal washings and aspirates are unacceptable for Xpert Xpress SARS-CoV-2/FLU/RSV testing.  Fact Sheet for Patients: EntrepreneurPulse.com.au  Fact Sheet for Healthcare Providers: IncredibleEmployment.be  This test is not yet approved or cleared by the [place] FDA and has been authorized for detection and/or diagnosis of SARS-CoV-2 by FDA under an Emergency Use Authorization (EUA). This EUA will remain in effect (meaning this test can be used) for the duration of the COVID-19 declaration under Section 564(b)(1) of the Act, 21 U.S.C. section 360bbb-3(b)(1), unless the authorization is terminated or revoked.  Performed at [facility], [address] [place], [place] [address]   MRSA Next Gen by PCR, Nasal     Status: None   Collection Time: 05/03/21  8:47 PM   Specimen: Nasal Mucosa; Nasal Swab  Result Value Ref Range Status   MRSA by PCR Next Gen NOT DETECTED NOT DETECTED Final    Comment: (NOTE) The GeneXpert MRSA Assay (FDA approved for NASAL specimens only), is one component of a comprehensive MRSA colonization surveillance program. It is not intended to diagnose MRSA infection nor to guide or monitor treatment for MRSA infections. Test performance is not FDA approved in patients less than 54 years old. Performed at [facility], [place] [address], [place], [place] [address]     Anti-infectives:  Anti-infectives (From admission, onward)    None       Best Practice/Protocols:  VTE Prophylaxis: Mechanical .  Consults:      Studies:    Events:  Subjective:    Overnight Issues:   Objective:  Vital signs for last 24 hours: Temp:  [98.1 F (36.7 C)-98.9 F (37.2 C)] 98.1 F (36.7 C) (01/25 0800) Pulse Rate:  [70-115] 70 (01/25 0600) Resp:  [11-21] 14 (01/25 0600) BP: (92-142)/(45-80) 101/54 (01/25 0600) SpO2:  [94 %-100 %] 100 % (01/25 0600)  Hemodynamic parameters for last 24 hours:    Intake/Output from previous day: 01/24 0701 - 01/25 0700 In: -  Out: 500 [Urine:500]  Intake/Output this shift: No intake/output data recorded.  Vent settings for last 24 hours:    Physical Exam:  General: alert and no respiratory distress Neuro: alert, oriented, and MAE HEENT/Neck: no JVD Resp: clear to auscultation bilaterally CVS: RRR GI: soft, NT, ND, R iliac wing very tender Extremities: calves soft  Results for orders placed or performed during the hospital encounter of 05/03/21 (from the past 24 hour(s))  CBC with Differential     Status: Abnormal   Collection Time: 05/03/21  3:33 PM  Result Value Ref Range   WBC 15.2 (H) 4.0 - 10.5 K/uL   RBC 2.85 (L) 4.22 - 5.81 MIL/uL   Hemoglobin 9.1 (L) 13.0 - 17.0 g/dL   HCT 27.8 (L) 39.0 - 52.0 %   MCV 97.5 80.0 - 100.0 fL   MCH 31.9 26.0 - 34.0 pg   MCHC 32.7 30.0 - 36.0 g/dL   RDW 15.9 (H) 11.5 - 15.5 %   Platelets 108 (L) 150 - 400 K/uL   nRBC 0.0 0.0 - 0.2 %   Neutrophils Relative % 74 %   Neutro Abs 11.2 (H) 1.7 - 7.7 K/uL   Lymphocytes Relative 13 %   Lymphs Abs 2.0 0.7 - 4.0 K/uL   Monocytes Relative 13 %   Monocytes Absolute 2.0 (H) 0.1 - 1.0 K/uL   Eosinophils Relative 0 %   Eosinophils Absolute 0.0 0.0 - 0.5 K/uL   Basophils Relative 0 %   Basophils Absolute 0.0 0.0 - 0.1 K/uL   Polychromasia PRESENT   Comprehensive metabolic panel     Status: Abnormal   Collection Time: 05/03/21  3:33 PM  Result Value Ref Range   Sodium 136 135 - 145 mmol/L   Potassium 4.5 3.5 - 5.1 mmol/L   Chloride 102 98 - 111 mmol/L   CO2 25 22 - 32  mmol/L   Glucose, Bld 336 (H) 70 - 99 mg/dL   BUN 33 (H) 8 - 23 mg/dL   Creatinine, Ser 1.62 (H) 0.61 - 1.24 mg/dL   Calcium 8.4 (L) 8.9 - 10.3 mg/dL   Total Protein 7.5 6.5 - 8.1 g/dL   Albumin 3.6 3.5 - 5.0 g/dL   AST 60 (H) 15 - 41 U/L   ALT 30 0 - 44 U/L   Alkaline Phosphatase 64 38 - 126 U/L   Total Bilirubin 0.7 0.3 - 1.2 mg/dL   GFR, Estimated 42 (L) >60 mL/min   Anion gap 9 5 - 15  Resp Panel by RT-PCR (Flu A&B, Covid) Nasopharyngeal Swab     Status: None   Collection Time: 05/03/21  5:55 PM   Specimen: Nasopharyngeal Swab; Nasopharyngeal(NP) swabs in vial transport medium  Result Value Ref Range   SARS Coronavirus 2 by RT PCR NEGATIVE NEGATIVE   Influenza A by PCR NEGATIVE  NEGATIVE   Influenza B by PCR NEGATIVE NEGATIVE  CBC     Status: Abnormal   Collection Time: 05/03/21  8:05 PM  Result Value Ref Range   WBC 14.2 (H) 4.0 - 10.5 K/uL   RBC 2.81 (L) 4.22 - 5.81 MIL/uL   Hemoglobin 8.6 (L) 13.0 - 17.0 g/dL   HCT 27.5 (L) 39.0 - 52.0 %   MCV 97.9 80.0 - 100.0 fL   MCH 30.6 26.0 - 34.0 pg   MCHC 31.3 30.0 - 36.0 g/dL   RDW 15.9 (H) 11.5 - 15.5 %   Platelets 87 (L) 150 - 400 K/uL   nRBC 0.0 0.0 - 0.2 %  MRSA Next Gen by PCR, Nasal     Status: None   Collection Time: 05/03/21  8:47 PM   Specimen: Nasal Mucosa; Nasal Swab  Result Value Ref Range   MRSA by PCR Next Gen NOT DETECTED NOT DETECTED  Glucose, capillary     Status: Abnormal   Collection Time: 05/03/21  9:45 PM  Result Value Ref Range   Glucose-Capillary 228 (H) 70 - 99 mg/dL  Glucose, capillary     Status: Abnormal   Collection Time: 05/04/21 12:24 AM  Result Value Ref Range   Glucose-Capillary 254 (H) 70 - 99 mg/dL  CBC     Status: Abnormal   Collection Time: 05/04/21  3:43 AM  Result Value Ref Range   WBC 17.2 (H) 4.0 - 10.5 K/uL   RBC 2.31 (L) 4.22 - 5.81 MIL/uL   Hemoglobin 7.2 (L) 13.0 - 17.0 g/dL   HCT 21.7 (L) 39.0 - 52.0 %   MCV 93.9 80.0 - 100.0 fL   MCH 31.2 26.0 - 34.0 pg   MCHC 33.2  30.0 - 36.0 g/dL   RDW 16.0 (H) 11.5 - 15.5 %   Platelets 89 (L) 150 - 400 K/uL   nRBC 0.0 0.0 - 0.2 %  Glucose, capillary     Status: None   Collection Time: 05/04/21  4:45 AM  Result Value Ref Range   Glucose-Capillary 79 70 - 99 mg/dL  CBC     Status: Abnormal   Collection Time: 05/04/21  7:20 AM  Result Value Ref Range   WBC 15.1 (H) 4.0 - 10.5 K/uL   RBC 2.34 (L) 4.22 - 5.81 MIL/uL   Hemoglobin 7.4 (L) 13.0 - 17.0 g/dL   HCT 22.0 (L) 39.0 - 52.0 %   MCV 94.0 80.0 - 100.0 fL   MCH 31.6 26.0 - 34.0 pg   MCHC 33.6 30.0 - 36.0 g/dL   RDW 15.9 (H) 11.5 - 15.5 %   Platelets 87 (L) 150 - 400 K/uL   nRBC 0.0 0.0 - 0.2 %  Basic metabolic panel     Status: Abnormal   Collection Time: 05/04/21  7:20 AM  Result Value Ref Range   Sodium 139 135 - 145 mmol/L   Potassium 3.9 3.5 - 5.1 mmol/L   Chloride 109 98 - 111 mmol/L   CO2 22 22 - 32 mmol/L   Glucose, Bld 86 70 - 99 mg/dL   BUN 31 (H) 8 - 23 mg/dL   Creatinine, Ser 1.54 (H) 0.61 - 1.24 mg/dL   Calcium 8.2 (L) 8.9 - 10.3 mg/dL   GFR, Estimated 45 (L) >60 mL/min   Anion gap 8 5 - 15  Glucose, capillary     Status: None   Collection Time: 05/04/21  8:10 AM  Result Value Ref Range   Glucose-Capillary 83  70 - 99 mg/dL    Assessment & Plan: Present on Admission:  Traumatic retroperitoneal hematoma    LOS: 1 day   Additional comments:I reviewed the patient's new clinical lab test results. .  Fall from step ladder 4am 1/24 R 10th rib FX - pain control and pulmonary toilet, CXR today neg Complex R iliac FX with large hematoma - Trauma Ortho eval P, bedrest for now. Hb has trended 9.1>>8.6>>7.2>>7.4 so seems to be stabilizing. Continue seriel CBC ABL anemia Thrombocytopenia - consumptive, 108>>87>>89>>87 DM - SSI CKD 3a VTE - no Lovenox until Hb stable and PLTs over 100k FEN - NPO P Ortho Trauma eval Dispo - ICU Discussed with patient he may need blood transfusion and the risks/benefits. He is agreeable if he needs  it.  Critical Care Total Time*: 37 Minutes  Georganna Skeans, MD, MPH, FACS Trauma & General Surgery Use [email or web address] to contact on call provider  05/04/2021  *Care during the described time interval was provided by me. I have reviewed this patient's available data, including medical history, events of note, physical examination and test results as part of my evaluation.

## 2021-05-04 NOTE — Progress Notes (Signed)
Trauma Event Note   TRN rounded on patient, alseep. Pt stable at this time, VS WDL. No needs at this time.   Last imported Vital Signs BP (!) 107/53    Pulse 63    Temp 97.7 F (36.5 C) (Oral)    Resp 11    Wt 111 lb 15.9 oz (50.8 kg)    SpO2 99%    BMI 18.64 kg/m   Trending CBC Recent Labs    05/04/21 0343 05/04/21 0720 05/04/21 1459  WBC 17.2* 15.1* 12.0*  HGB 7.2* 7.4* 8.2*  HCT 21.7* 22.0* 24.7*  PLT 89* 87* 92*    Trending Coag's No results for input(s): APTT, INR in the last 72 hours.  Trending BMET Recent Labs    05/03/21 1533 05/04/21 0720  NA 136 139  K 4.5 3.9  CL 102 109  CO2 25 22  BUN 33* 31*  CREATININE 1.62* 1.54*  GLUCOSE 336* 86      Jacob Barnes O Jacob Barnes  Trauma Response RN  Please call TRN at [phone] for further assistance.

## 2021-05-04 NOTE — Progress Notes (Signed)
Inpatient Diabetes Program Recommendations  AACE/ADA: New Consensus Statement on Inpatient Glycemic Control (2015)  Target Ranges:  Prepandial:   less than 140 mg/dL      Peak postprandial:   less than 180 mg/dL (1-2 hours)      Critically ill patients:  140 - 180 mg/dL   Lab Results  Component Value Date   GLUCAP 100 (H) 05/04/2021   HGBA1C 8.5 (H) 03/29/2021    Review of Glycemic Control  Diabetes history: DM2 Outpatient Diabetes medications: Tresiba 10 QHS, tradjenta 5 QD Current orders for Inpatient glycemic control: Novolog 0-9 units Q4H  HgbA1C - 8.5%  Inpatient Diabetes Program Recommendations:    Add Semglee 5 units QHS  Will follow glucose trends.  Thank you. Lorenda Peck, RD, LDN, CDE Inpatient Diabetes Coordinator [phone]

## 2021-05-04 NOTE — Consult Note (Signed)
Reason for Consult:Right iliac fx Referring Physician: Romana Juniper Time called: 0923 Time at bedside: [name] is an 81 y.o. male.  HPI: Jacob Barnes was on a short ladder working on his boiler. As he was getting off the ladder turned under him and he fell. He struck his right hip on the ladder when he fell. He tried to tough things out at home and was able to ambulate but finally presented to the [facility] ED. Workup showed a right iliac fx with associated hematoma and a possible rib fxs. He was transferred to [facility] and orthopedic surgery was consulted. He lives at home with his wife and daughter.  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   Anemia    Arthritis    hands   Carpal tunnel syndrome 08/05/2009   Qualifier: Diagnosis of  By: Aline Brochure MD, [name]     Chronic kidney disease    Diabetes mellitus    says since 1979 type 2   Diabetes mellitus without complication ([place])    Phreesia 04/03/2020   ERECTILE DYSFUNCTION, ORGANIC 01/20/2009   Qualifier: Diagnosis of  By: Moshe Cipro MD, Margaret     GERD (gastroesophageal reflux disease)    Glaucoma    both eyes   History of kidney stones    Hyperlipemia    Hypertension    Iron deficiency anemia 01/10/2014   Other pancytopenia ([place]) 01/10/2014   New in 01/2014, undergoing pathology review    Rash    front abdomen no drainage    Past Surgical History:  Procedure Laterality Date   AMPUTATION Right 09/01/2016   Procedure: AMPUTATION RIGHT MIDDLE FINGER TIP;  Surgeon: Mcarthur Rossetti, MD;  Location: [facility];  Service: Orthopedics;  Laterality: Right;   AMPUTATION Right 10/10/2016   Procedure: REPEAT IRRIGATION AND DEBRIDEMENT RIGHT MIDDLE FINGER WITH AMPUTATION THROUGH PROXIMAL PHALANX;  Surgeon: Mcarthur Rossetti, MD;  Location: [facility];  Service: Orthopedics;  Laterality: Right;   APPENDECTOMY     COLONOSCOPY  09/06/2011    Procedure: COLONOSCOPY;  Surgeon: Rogene Houston, MD;  Location[facility];  Service: Endoscopy;  Laterality: N/A;  830   CYSTOSCOPY/RETROGRADE/URETEROSCOPY/STONE EXTRACTION WITH BASKET     ESOPHAGOGASTRODUODENOSCOPY N/A 01/15/2014   Procedure: ESOPHAGOGASTRODUODENOSCOPY (EGD);  Surgeon: Rogene Houston, MD;  [facility];  Service: Endoscopy;  Laterality: N/A;  200   ESOPHAGOGASTRODUODENOSCOPY N/A 04/22/2014   Procedure: ESOPHAGOGASTRODUODENOSCOPY (EGD);  Surgeon: Rogene Houston, MD;  [facility];  Service: Endoscopy;  Laterality: N/A;  240   EYE SURGERY Bilateral yrs ago   ioc for cataract   MALONEY DILATION N/A 01/15/2014   Procedure: MALONEY DILATION;  Surgeon: Rogene Houston, MD;  [facility];  Service: Endoscopy;  Laterality: N/A;   MALONEY DILATION  04/22/2014   Procedure: Venia Minks DILATION;  Surgeon: Rogene Houston, MD;  Location[facility];  Service: Endoscopy;;   surgery for left eye bleedf  2017    Family History  Problem Relation Age of Onset   Diabetes Mother    Hypertension Mother    Hyperlipidemia Mother    Stroke Mother    Heart failure Mother    Diabetes Father    Heart disease Father    Hypertension Father    Hyperlipidemia Father    Heart attack Father    Diabetes  Brother    Diabetes Brother    Diabetes Son     Social History:  reports that he has never smoked. He has never used smokeless tobacco. He reports that he does not drink alcohol and does not use drugs.  Allergies: No Known Allergies  Medications: I have reviewed the patient's current medications.  Results for orders placed or performed during the hospital encounter of 05/03/21 (from the past 48 hour(s))  CBC with Differential     Status: Abnormal   Collection Time: 05/03/21  3:33 PM  Result Value Ref Range   WBC 15.2 (H) 4.0 - 10.5 K/uL   RBC 2.85 (L) 4.22 - 5.81 MIL/uL   Hemoglobin 9.1 (L) 13.0 - 17.0 g/dL   HCT 27.8 (L) 39.0 - 52.0 %   MCV 97.5 80.0  - 100.0 fL   MCH 31.9 26.0 - 34.0 pg   MCHC 32.7 30.0 - 36.0 g/dL   RDW 15.9 (H) 11.5 - 15.5 %   Platelets 108 (L) 150 - 400 K/uL    Comment: SPECIMEN CHECKED FOR CLOTS Immature Platelet Fraction may be clinically indicated, consider ordering this additional test [id number] CONSISTENT WITH PREVIOUS RESULT REPEATED TO VERIFY    nRBC 0.0 0.0 - 0.2 %   Neutrophils Relative % 74 %   Neutro Abs 11.2 (H) 1.7 - 7.7 K/uL   Lymphocytes Relative 13 %   Lymphs Abs 2.0 0.7 - 4.0 K/uL   Monocytes Relative 13 %   Monocytes Absolute 2.0 (H) 0.1 - 1.0 K/uL   Eosinophils Relative 0 %   Eosinophils Absolute 0.0 0.0 - 0.5 K/uL   Basophils Relative 0 %   Basophils Absolute 0.0 0.0 - 0.1 K/uL   Polychromasia PRESENT     Comment: Performed at [facility], [address], [place], [place] [address]  Comprehensive metabolic panel     Status: Abnormal   Collection Time: 05/03/21  3:33 PM  Result Value Ref Range   Sodium 136 135 - 145 mmol/L   Potassium 4.5 3.5 - 5.1 mmol/L   Chloride 102 98 - 111 mmol/L   CO2 25 22 - 32 mmol/L   Glucose, Bld 336 (H) 70 - 99 mg/dL    Comment: Glucose reference range applies only to samples taken after fasting for at least 8 hours.   BUN 33 (H) 8 - 23 mg/dL   Creatinine, Ser 1.62 (H) 0.61 - 1.24 mg/dL   Calcium 8.4 (L) 8.9 - 10.3 mg/dL   Total Protein 7.5 6.5 - 8.1 g/dL   Albumin 3.6 3.5 - 5.0 g/dL   AST 60 (H) 15 - 41 U/L   ALT 30 0 - 44 U/L   Alkaline Phosphatase 64 38 - 126 U/L   Total Bilirubin 0.7 0.3 - 1.2 mg/dL   GFR, Estimated 42 (L) >60 mL/min    Comment: (NOTE) Calculated using the CKD-EPI Creatinine Equation (2021)    Anion gap 9 5 - 15    Comment: Performed at [facility], [address] [place], [place] [address]  Resp Panel by RT-PCR (Flu A&B, Covid) Nasopharyngeal Swab     Status: None   Collection Time: 05/03/21  5:55 PM   Specimen: Nasopharyngeal Swab; Nasopharyngeal(NP) swabs in vial transport medium  Result Value Ref Range   SARS  Coronavirus 2 by RT PCR NEGATIVE NEGATIVE    Comment: (NOTE) SARS-CoV-2 target nucleic acids are NOT DETECTED.  The SARS-CoV-2 RNA is generally detectable in upper respiratory specimens during the acute phase of infection.  The lowest concentration of SARS-CoV-2 viral copies this assay can detect is 138 copies/mL. A negative result does not preclude SARS-Cov-2 infection and should not be used as the sole basis for treatment or other patient management decisions. A negative result may occur with  improper specimen collection/handling, submission of specimen other than nasopharyngeal swab, presence of viral mutation(s) within the areas targeted by this assay, and inadequate number of viral copies(<138 copies/mL). A negative result must be combined with clinical observations, patient history, and epidemiological information. The expected result is Negative.  Fact Sheet for Patients:  EntrepreneurPulse.com.au  Fact Sheet for Healthcare Providers:  IncredibleEmployment.be  This test is no t yet approved or cleared by the [place] FDA and  has been authorized for detection and/or diagnosis of SARS-CoV-2 by FDA under an Emergency Use Authorization (EUA). This EUA will remain  in effect (meaning this test can be used) for the duration of the COVID-19 declaration under Section 564(b)(1) of the Act, 21 U.S.C.section 360bbb-3(b)(1), unless the authorization is terminated  or revoked sooner.       Influenza A by PCR NEGATIVE NEGATIVE   Influenza B by PCR NEGATIVE NEGATIVE    Comment: (NOTE) The Xpert Xpress SARS-CoV-2/FLU/RSV plus assay is intended as an aid in the diagnosis of influenza from Nasopharyngeal swab specimens and should not be used as a sole basis for treatment. Nasal washings and aspirates are unacceptable for Xpert Xpress SARS-CoV-2/FLU/RSV testing.  Fact Sheet for Patients: EntrepreneurPulse.com.au  Fact Sheet  for Healthcare Providers: IncredibleEmployment.be  This test is not yet approved or cleared by the [place] FDA and has been authorized for detection and/or diagnosis of SARS-CoV-2 by FDA under an Emergency Use Authorization (EUA). This EUA will remain in effect (meaning this test can be used) for the duration of the COVID-19 declaration under Section 564(b)(1) of the Act, 21 U.S.C. section 360bbb-3(b)(1), unless the authorization is terminated or revoked.  Performed at [facility], [address], [place], [place] [address]   CBC     Status: Abnormal   Collection Time: 05/03/21  8:05 PM  Result Value Ref Range   WBC 14.2 (H) 4.0 - 10.5 K/uL    Comment: WHITE COUNT CONFIRMED ON SMEAR   RBC 2.81 (L) 4.22 - 5.81 MIL/uL   Hemoglobin 8.6 (L) 13.0 - 17.0 g/dL   HCT 27.5 (L) 39.0 - 52.0 %   MCV 97.9 80.0 - 100.0 fL   MCH 30.6 26.0 - 34.0 pg   MCHC 31.3 30.0 - 36.0 g/dL   RDW 15.9 (H) 11.5 - 15.5 %   Platelets 87 (L) 150 - 400 K/uL    Comment: Immature Platelet Fraction may be clinically indicated, consider ordering this additional test [id number] REPEATED TO VERIFY PLATELET COUNT CONFIRMED BY SMEAR    nRBC 0.0 0.0 - 0.2 %    Comment: Performed at [facility], [place] [address], [place],  [address]  MRSA Next Gen by PCR, Nasal     Status: None   Collection Time: 05/03/21  8:47 PM   Specimen: Nasal Mucosa; Nasal Swab  Result Value Ref Range   MRSA by PCR Next Gen NOT DETECTED NOT DETECTED    Comment: (NOTE) The GeneXpert MRSA Assay (FDA approved for NASAL specimens only), is one component of a comprehensive MRSA colonization surveillance program. It is not intended to diagnose MRSA infection nor to guide or monitor treatment for MRSA infections. Test performance is not FDA approved in patients less than 55 years old. Performed  at [facility], [place] [address], [place], [place] [address]   Glucose, capillary     Status: Abnormal    Collection Time: 05/03/21  9:45 PM  Result Value Ref Range   Glucose-Capillary 228 (H) 70 - 99 mg/dL    Comment: Glucose reference range applies only to samples taken after fasting for at least 8 hours.  Glucose, capillary     Status: Abnormal   Collection Time: 05/04/21 12:24 AM  Result Value Ref Range   Glucose-Capillary 254 (H) 70 - 99 mg/dL    Comment: Glucose reference range applies only to samples taken after fasting for at least 8 hours.  CBC     Status: Abnormal   Collection Time: 05/04/21  3:43 AM  Result Value Ref Range   WBC 17.2 (H) 4.0 - 10.5 K/uL   RBC 2.31 (L) 4.22 - 5.81 MIL/uL   Hemoglobin 7.2 (L) 13.0 - 17.0 g/dL   HCT 21.7 (L) 39.0 - 52.0 %   MCV 93.9 80.0 - 100.0 fL   MCH 31.2 26.0 - 34.0 pg   MCHC 33.2 30.0 - 36.0 g/dL   RDW 16.0 (H) 11.5 - 15.5 %   Platelets 89 (L) 150 - 400 K/uL    Comment: Immature Platelet Fraction may be clinically indicated, consider ordering this additional test [id number] CONSISTENT WITH PREVIOUS RESULT REPEATED TO VERIFY    nRBC 0.0 0.0 - 0.2 %    Comment: Performed at [facility], [place] [address] [place], [place] [address]  Glucose, capillary     Status: None   Collection Time: 05/04/21  4:45 AM  Result Value Ref Range   Glucose-Capillary 79 70 - 99 mg/dL    Comment: Glucose reference range applies only to samples taken after fasting for at least 8 hours.  CBC     Status: Abnormal   Collection Time: 05/04/21  7:20 AM  Result Value Ref Range   WBC 15.1 (H) 4.0 - 10.5 K/uL   RBC 2.34 (L) 4.22 - 5.81 MIL/uL   Hemoglobin 7.4 (L) 13.0 - 17.0 g/dL   HCT 22.0 (L) 39.0 - 52.0 %   MCV 94.0 80.0 - 100.0 fL   MCH 31.6 26.0 - 34.0 pg   MCHC 33.6 30.0 - 36.0 g/dL   RDW 15.9 (H) 11.5 - 15.5 %   Platelets 87 (L) 150 - 400 K/uL    Comment: Immature Platelet Fraction may be clinically indicated, consider ordering this additional test [id number] CONSISTENT WITH PREVIOUS RESULT REPEATED TO VERIFY    nRBC 0.0 0.0 - 0.2 %     Comment: Performed at [facility], [place] [address], [place], [place] [address]  Basic metabolic panel     Status: Abnormal   Collection Time: 05/04/21  7:20 AM  Result Value Ref Range   Sodium 139 135 - 145 mmol/L   Potassium 3.9 3.5 - 5.1 mmol/L   Chloride 109 98 - 111 mmol/L   CO2 22 22 - 32 mmol/L   Glucose, Bld 86 70 - 99 mg/dL    Comment: Glucose reference range applies only to samples taken after fasting for at least 8 hours.   BUN 31 (H) 8 - 23 mg/dL   Creatinine, Ser 1.54 (H) 0.61 - 1.24 mg/dL   Calcium 8.2 (L) 8.9 - 10.3 mg/dL   GFR, Estimated 45 (L) >60 mL/min    Comment: (NOTE) Calculated using the CKD-EPI Creatinine Equation (2021)    Anion gap 8 5 -  15    Comment: Performed at [facility], [place] [address], [place], [place] [address]  Glucose, capillary     Status: None   Collection Time: 05/04/21  8:10 AM  Result Value Ref Range   Glucose-Capillary 83 70 - 99 mg/dL    Comment: Glucose reference range applies only to samples taken after fasting for at least 8 hours.    DG Ribs Unilateral W/Chest Right  Result Date: 05/03/2021 CLINICAL DATA:  Right rib pain after fall. EXAM: RIGHT RIBS AND CHEST - 3+ VIEW COMPARISON:  December 11, 2013. FINDINGS: Probable mildly displaced fracture is seen involving anterior portion of right tenth rib. There is no evidence of pneumothorax or pleural effusion. Both lungs are clear. Heart size and mediastinal contours are within normal limits. IMPRESSION: Probable mildly displaced right tenth rib fracture. Electronically Signed   By: Marijo Conception M.D.   On: 05/03/2021 14:27   CT CHEST ABDOMEN PELVIS W CONTRAST  Result Date: 05/03/2021 CLINICAL DATA:  Trauma, fall EXAM: CT CHEST, ABDOMEN, AND PELVIS WITH CONTRAST TECHNIQUE: Multidetector CT imaging of the chest, abdomen and pelvis was performed following the standard protocol during bolus administration of intravenous contrast. RADIATION DOSE REDUCTION: This exam was performed  according to the departmental dose-optimization program which includes automated exposure control, adjustment of the mA and/or kV according to patient size and/or use of iterative reconstruction technique. CONTRAST:  41mL OMNIPAQUE IOHEXOL 300 MG/ML  SOLN COMPARISON:  None. FINDINGS: CT CHEST FINDINGS Cardiovascular: Coronary artery calcifications are seen. There is homogeneous enhancement in thoracic aorta. There is ectasia of ascending thoracic aorta measuring 3.4 cm. There are no filling defects in central pulmonary arteries. Mediastinum/Nodes: There is no evidence of mediastinal hematoma. Lungs/Pleura: Centrilobular emphysema is seen. There is no focal pulmonary consolidation. There is slight increase in interstitial markings in the lower lung fields. Few blebs and bullae are seen in the periphery of right lower lung fields. CT ABDOMEN PELVIS FINDINGS Hepatobiliary: There is no demonstrable laceration. Gallbladder is unremarkable. Pancreas: There is prominence of pancreatic duct. No focal abnormality is seen. Spleen: Unremarkable. Adrenals/Urinary Tract: Adrenals are not enlarged. There is no hydronephrosis. There is no demonstrable cortical laceration. Possible subcentimeter cysts are seen in the lower pole of right kidney. There are no renal or ureteral stones. Urinary bladder is unremarkable. Stomach/Bowel: Small hiatal hernia is seen. Small bowel loops are not dilated. Appendix is not dilated. Appendix is seen in the right pelvic cavity. There is no significant wall thickening in the colon. Vascular/Lymphatic: Aorta is of normal caliber. There is no active extravasation of contrast from aorta or its major branches. Reproductive: There is inhomogeneous attenuation and coarse calcifications in the prostate and seminal vesicles. Other: There is severely comminuted fracture in the right iliac bone. There is mild displacement of fracture fragments. There is large hematoma in the right iliac fossa measuring  approximately 8.9 x 4.7 cm. There are small linear high density foci in the anterior aspect of right iliacus. Possibility of active extravasation of contrast in the right iliac fossa is not excluded. Inferior course of right iliacus muscle is thickened, possibly due to extension of hematoma. Right gluteal muscles appear more prominent than left suggesting possible contusion/hematoma. There is small amount of free fluid in the pelvis. There is moderate sized right perinephric fluid collection extending to the right lower quadrant. There is no pneumoperitoneum. Musculoskeletal: Comminuted displaced fracture is seen in the right iliac bone. There is marked compression of body of L1  vertebra. There are pockets of air in the disc space at T12-L1 level. Findings may suggest old compression fracture. Possibility of re-injury is not excluded. Degenerative changes are noted in the lower cervical spine and lower lumbar spine. There is minimal cortical irregularity in the anterior end of right first rib. There is mild deformity of in the cortical margin in the anterior right eighth rib suggesting old healed fracture. IMPRESSION: There is severely comminuted displaced fracture in the right iliac bone. There is large hematoma in the right iliac fossa. There are small linear high density foci in the anterior aspect of right iliacus which may suggest possible active extravasation of contrast due to vascular injury. There is moderate sized right perinephric hematoma extending to the right iliac fossa. Small ascites is seen in the pelvic cavity. Right gluteal muscles appear larger in size, possibly suggesting hematoma in the right gluteal region. There is no demonstrable laceration in the solid organs. There is no evidence mediastinal hematoma. There is no demonstrable para-aortic hematoma in the abdomen. There is cortical irregularity in the anterior right first rib which may suggest recent or old injury. There is evidence of old  healed fracture in the right eighth rib in the anterior aspect. There is marked compression of body of L1 vertebra with sclerosis. This most likely suggests old compression fracture. Please correlate with clinical findings to rule out re-injury at the site of old fracture. Coronary artery disease.  COPD.  Small hiatal hernia. Imaging finding of possible active extravasation of contrast in the right iliac fossa was relayed to Dr. Vanita Panda by telephone call. Electronically Signed   By: Elmer Picker M.D.   On: 05/03/2021 17:10   DG Chest Port 1 View  Result Date: 05/04/2021 CLINICAL DATA:  81 year old male with history of suspected rib fractures. EXAM: PORTABLE CHEST 1 VIEW COMPARISON:  Chest x-ray 05/03/2021. FINDINGS: Lung volumes are low. No consolidative airspace disease. No pleural effusions. No pneumothorax. No pulmonary nodule or mass noted. Pulmonary vasculature and the cardiomediastinal silhouette are within normal limits. No definite acute displaced rib fractures are identified. IMPRESSION: 1. Low lung volumes without radiographic evidence of acute cardiopulmonary disease. Electronically Signed   By: Vinnie Langton M.D.   On: 05/04/2021 06:59   DG Hip Unilat W or Wo Pelvis 2-3 Views Right  Result Date: 05/03/2021 CLINICAL DATA:  Fall off ladder today with right hip pain. Unable to bear weight. EXAM: DG HIP (WITH OR WITHOUT PELVIS) 2-3V RIGHT COMPARISON:  None. FINDINGS: Comminuted fracture of the right iliac bone extends from the iliac crest to the lateral aspect of the acetabulum. The fracture is comminuted and displaced. There is no extension to the articular surface. Fracture displacement is 4-5 mm. Femoral head is well seated in the acetabulum with mild symmetric disc space narrowing and degenerative change. Intact pubic rami. Pubic symphysis and sacroiliac joints are congruent. IMPRESSION: Comminuted and displaced right iliac bone fracture, extending adjacent to the right acetabulum, but  no definite articular involvement. Electronically Signed   By: Keith Rake M.D.   On: 05/03/2021 14:54    Review of Systems  HENT:  Negative for ear discharge, ear pain, hearing loss and tinnitus.   Eyes:  Negative for photophobia and pain.  Respiratory:  Negative for cough and shortness of breath.   Cardiovascular:  Negative for chest pain.  Gastrointestinal:  Negative for abdominal pain, nausea and vomiting.  Genitourinary:  Negative for dysuria, flank pain, frequency and urgency.  Musculoskeletal:  Positive for arthralgias (  Right hip). Negative for back pain, myalgias and neck pain.  Neurological:  Negative for dizziness and headaches.  Hematological:  Does not bruise/bleed easily.  Psychiatric/Behavioral:  The patient is not nervous/anxious.   Blood pressure (!) 101/54, pulse 70, temperature 98.1 F (36.7 C), temperature source Oral, resp. rate 14, SpO2 100 %. Physical Exam Constitutional:      General: He is not in acute distress.    Appearance: He is well-developed. He is not diaphoretic.  HENT:     Head: Normocephalic and atraumatic.  Eyes:     General: No scleral icterus.       Right eye: No discharge.        Left eye: No discharge.     Conjunctiva/sclera: Conjunctivae normal.  Cardiovascular:     Rate and Rhythm: Normal rate and regular rhythm.  Pulmonary:     Effort: Pulmonary effort is normal. No respiratory distress.  Musculoskeletal:     Cervical back: Normal range of motion.     Comments: RLE No traumatic wounds, ecchymosis, or rash  Nontender  No knee or ankle effusion  Knee stable to varus/ valgus and anterior/posterior stress  Sens DPN, SPN, TN intact  Motor EHL, ext, flex, evers 5/5  DP 1+, PT 0, No significant edema  Skin:    General: Skin is warm and dry.  Neurological:     Mental Status: He is alert.  Psychiatric:        Mood and Affect: Mood normal.        Behavior: Behavior normal.    Assessment/Plan: Right iliac fx -- Plan non-operative  management with WBAT RLE. F/u with Dr. Lucia Gaskins in 2-3 weeks.    Lisette Abu, PA-C Orthopedic Surgery [phone] 05/04/2021, 9:33 AM

## 2021-05-04 NOTE — TOC CAGE-AID Note (Signed)
Transition of Barnes [facility]) - CAGE-AID Screening   Patient Details  Name: Jacob Barnes MRN: [id number] Date of Birth: 1941/04/10  Transition of [facility]) CM/SW Contact:    Jacob Barnes, [name] Phone Number: 05/04/2021, 10:30 AM   Clinical Narrative: Pt participated in [place].  Pt stated he does not use substance or ETOH.  Pt was not offered resources, due to no usage of substance or ETOH.    Jacob Barnes, MSW, LCSW-A Pronouns:  She/Her/Hers Jacob Barnes Clinical Social Worker Direct Number:  [phone] [email or web address]  CAGE-AID Screening:    Have You Ever Felt You Ought to Cut Down on Your Drinking or Drug Use?: No Have People Annoyed You By [employer] Your Drinking Or Drug Use?: No Have You Felt Bad Or Guilty About Your Drinking Or Drug Use?: No Have You Ever Had a Drink or Used Drugs First Thing In The Morning to Steady Your Nerves or to Get Rid of a Hangover?: No CAGE-AID Score: 0  Substance Abuse Education Offered: No

## 2021-05-04 NOTE — Progress Notes (Signed)
°  Transition of Care [facility]) Screening Note   Patient Details  Name: Jacob Barnes Date of Birth: [birth year]   Transition of Care [facility]) CM/SW Contact:    Ella Bodo, RN Phone Number: 05/04/2021, 4:10 PM    Transition of Care Department [facility]) has reviewed patient and no TOC needs have been identified at this time. We will continue to monitor patient advancement through interdisciplinary progression rounds. If new patient transition needs arise, please place a TOC consult.   Reinaldo Raddle, RN, BSN  Trauma/Neuro ICU Case Manager [phone]

## 2021-05-05 LAB — BASIC METABOLIC PANEL
Anion gap: 5 (ref 5–15)
BUN: 26 mg/dL — ABNORMAL HIGH (ref 8–23)
CO2: 23 mmol/L (ref 22–32)
Calcium: 7.8 mg/dL — ABNORMAL LOW (ref 8.9–10.3)
Chloride: 111 mmol/L (ref 98–111)
Creatinine, Ser: 1.37 mg/dL — ABNORMAL HIGH (ref 0.61–1.24)
GFR, Estimated: 52 mL/min — ABNORMAL LOW (ref >60)
Glucose, Bld: 176 mg/dL — ABNORMAL HIGH (ref 70–99)
Potassium: 3.9 mmol/L (ref 3.5–5.1)
Sodium: 139 mmol/L (ref 135–145)

## 2021-05-05 LAB — CBC
HCT: 19.3 % — ABNORMAL LOW (ref 39.0–52.0)
HCT: 26.5 % — ABNORMAL LOW (ref 39.0–52.0)
Hemoglobin: 6.2 g/dL — CL (ref 13.0–17.0)
Hemoglobin: 8.5 g/dL — ABNORMAL LOW (ref 13.0–17.0)
MCH: 30.5 pg (ref 26.0–34.0)
MCH: 29.9 pg (ref 26.0–34.0)
MCHC: 32.1 g/dL (ref 30.0–36.0)
MCHC: 32.1 g/dL (ref 30.0–36.0)
MCV: 95.1 fL (ref 80.0–100.0)
MCV: 93.3 fL (ref 80.0–100.0)
Platelets: 86 10*3/uL — ABNORMAL LOW (ref 150–400)
Platelets: 86 10*3/uL — ABNORMAL LOW (ref 150–400)
RBC: 2.03 MIL/uL — ABNORMAL LOW (ref 4.22–5.81)
RBC: 2.84 MIL/uL — ABNORMAL LOW (ref 4.22–5.81)
RDW: 16.4 % — ABNORMAL HIGH (ref 11.5–15.5)
RDW: 16.1 % — ABNORMAL HIGH (ref 11.5–15.5)
WBC: 9.5 10*3/uL (ref 4.0–10.5)
WBC: 10 10*3/uL (ref 4.0–10.5)
nRBC: 0 % (ref 0.0–0.2)
nRBC: 0 % (ref 0.0–0.2)

## 2021-05-05 LAB — GLUCOSE, CAPILLARY
Glucose-Capillary: 114 mg/dL — ABNORMAL HIGH (ref 70–99)
Glucose-Capillary: 130 mg/dL — ABNORMAL HIGH (ref 70–99)
Glucose-Capillary: 132 mg/dL — ABNORMAL HIGH (ref 70–99)
Glucose-Capillary: 154 mg/dL — ABNORMAL HIGH (ref 70–99)
Glucose-Capillary: 155 mg/dL — ABNORMAL HIGH (ref 70–99)
Glucose-Capillary: 157 mg/dL — ABNORMAL HIGH (ref 70–99)
Glucose-Capillary: 270 mg/dL — ABNORMAL HIGH (ref 70–99)

## 2021-05-05 LAB — ABO/RH: ABO/RH(D): O POS

## 2021-05-05 LAB — PREPARE RBC (CROSSMATCH)

## 2021-05-05 MED ORDER — SODIUM CHLORIDE 0.9% IV SOLUTION: Freq: Once | INTRAVENOUS | Status: AC

## 2021-05-05 NOTE — Progress Notes (Incomplete)
STUDENT FOLLOW-UP NOTES - DISREGARD              CC/HPI:    PMH:   Dx:    PLAN:    Anticoag:   ID:   Antimicrobials this admission:  *** *** >> *** *** *** >> ***  Microbiology results: *** BCx: *** *** UCx: ***  *** Sputum: ***  *** Resp. Panel: *** *** MRSA PCR: ***  CV:   Endocrine:   GI/Nutrition:   Neuro:   Renal:   Pulm:   Heme/Onc:   PTA Med Issues:   Additional Notes:

## 2021-05-05 NOTE — Evaluation (Signed)
Physical Therapy Evaluation Patient Details Name: Jacob Barnes MRN: [id number] DOB: 1940/06/16 Today's Date: 05/05/2021  History of Present Illness  81 yo male presents to ED On 1/24 after fall off step ladder, sustaining retroperitoneal hematoma and possible extravasation, R 10th rib fracture, R iliac fracture. PMH includes dementia, DM with retinopathy, HTN, carpal tunnel, HTN, HLD.  Clinical Impression   Pt presents with moderate to severe R-sided pain, min difficulty mobilizing, impaired balance, and decreased activity tolerance vs baseline. Pt to benefit from acute PT to address deficits. Pt ambulated short room distance, limited in distance by R flank and hip pain. Pt also noting pain in R hamstring during mobility. PT anticipates pt will progress well while acute, states he has good family support to assist with mobility once home. PT to progress mobility as tolerated, and will continue to follow acutely.         Recommendations for follow up therapy are one component of a multi-disciplinary discharge planning process, led by the attending physician.  Recommendations may be updated based on patient status, additional functional criteria and insurance authorization.  Follow Up Recommendations Home health PT    Assistance Recommended at Discharge Frequent or constant Supervision/Assistance (from wife and/or daughter)  Patient can return home with the following  A little help with walking and/or transfers;Assist for transportation;Help with stairs or ramp for entrance    Equipment Recommendations Rolling walker (2 wheels)  Recommendations for Other Services       Functional Status Assessment Patient has had a recent decline in their functional status and demonstrates the ability to make significant improvements in function in a reasonable and predictable amount of time.     Precautions / Restrictions Precautions Precautions: Fall Restrictions Weight Bearing Restrictions: No RLE  Weight Bearing: Weight bearing as tolerated      Mobility  Bed Mobility Overal bed mobility: Needs Assistance Bed Mobility: Rolling, Sidelying to Sit Rolling: Min assist Sidelying to sit: Min assist       General bed mobility comments: assist for LE translation to EOB and truncal elevation. Cues for sequencing    Transfers Overall transfer level: Needs assistance Equipment used: Rolling walker (2 wheels) Transfers: Sit to/from Stand Sit to Stand: Min assist           General transfer comment: Min assist for power up, rise, steadying upon standing    Ambulation/Gait Ambulation/Gait assistance: Min assist Gait Distance (Feet): 15 Feet Assistive device: Rolling walker (2 wheels) Gait Pattern/deviations: Step-through pattern, Decreased stride length, Narrow base of support, Scissoring Gait velocity: decr     General Gait Details: assist to steady, especially during turns. Cues for upright posture, widening BOS, taking bigger steps  Stairs            Wheelchair Mobility    Modified Rankin (Stroke Patients Only)       Balance Overall balance assessment: History of Falls, Needs assistance Sitting-balance support: No upper extremity supported, Feet supported Sitting balance-Leahy Scale: Good     Standing balance support: Bilateral upper extremity supported, During functional activity Standing balance-Leahy Scale: Poor Standing balance comment: reliant on external support                             Pertinent Vitals/Pain Pain Assessment Pain Assessment: Faces Faces Pain Scale: Hurts even more Pain Location: R side Pain Descriptors / Indicators: Sore, Discomfort, Sharp Pain Intervention(s): Limited activity within patient's tolerance, Monitored during session, Repositioned  Home Living Family/patient expects to be discharged to:: Private residence Living Arrangements: Alone;Spouse/significant other;Children (wife lives in [place], can  stay with him or vice versa at d/c. Daughter can also stay with pt) Available Help at Discharge: Family Type of Home: House Home Access: Stairs to enter Entrance Stairs-Rails: Right;Left;Can reach both Technical brewer of Steps: 4   Home Layout: Able to live on main level with bedroom/bathroom Home Equipment: Shower seat;Cane - single point      Prior Function Prior Level of Function : Independent/Modified Independent             Mobility Comments: pt reports using cane as needed, but otherwise is independent       Hand Dominance   Dominant Hand: Right    Extremity/Trunk Assessment   Upper Extremity Assessment Upper Extremity Assessment: Defer to OT evaluation    Lower Extremity Assessment Lower Extremity Assessment: Generalized weakness;RLE deficits/detail RLE Deficits / Details: limited AROM secondary to R hip and flank pain RLE: Unable to fully assess due to pain    Cervical / Trunk Assessment Cervical / Trunk Assessment: Normal  Communication   Communication: No difficulties;HOH  Cognition Arousal/Alertness: Awake/alert Behavior During Therapy: WFL for tasks assessed/performed Overall Cognitive Status: Within Functional Limits for tasks assessed                                          General Comments General comments (skin integrity, edema, etc.): SPO2 reading inaccurate for majority of session due to pt's cold fingers, SPO2 90% and greater when accurate pleth is present    Exercises     Assessment/Plan    PT Assessment Patient needs continued PT services  PT Problem List Decreased strength;Decreased mobility;Decreased safety awareness;Decreased activity tolerance;Decreased balance;Decreased knowledge of use of DME;Pain       PT Treatment Interventions DME instruction;Therapeutic activities;Gait training;Therapeutic exercise;Patient/family education;Balance training;Stair training;Functional mobility training;Neuromuscular  re-education    PT Goals (Current goals can be found in the Care Plan section)  Acute Rehab PT Goals Patient Stated Goal: home PT Goal Formulation: With patient Time For Goal Achievement: 05/19/21 Potential to Achieve Goals: Good    Frequency Min 4X/week     Co-evaluation               AM-PAC PT "6 Clicks" Mobility  Outcome Measure Help needed turning from your back to your side while in a flat bed without using bedrails?: A Little Help needed moving from lying on your back to sitting on the side of a flat bed without using bedrails?: A Little Help needed moving to and from a bed to a chair (including a wheelchair)?: A Little Help needed standing up from a chair using your arms (e.g., wheelchair or bedside chair)?: A Little Help needed to walk in hospital room?: A Little Help needed climbing 3-5 steps with a railing? : A Lot 6 Click Score: 17    End of Session   Activity Tolerance: Patient tolerated treatment well;Patient limited by pain Patient left: in chair;with call bell/phone within reach;with chair alarm set Nurse Communication: Mobility status PT Visit Diagnosis: Other abnormalities of gait and mobility (R26.89);Muscle weakness (generalized) (M62.81)    Time: 9518-8416 PT Time Calculation (min) (ACUTE ONLY): 25 min   Charges:   PT Evaluation $PT Eval Low Complexity: 1 Low PT Treatments $Therapeutic Activity: 8-22 mins        Roena Sassaman S,  PT DPT Acute Rehabilitation Services Pager [phone]  Office [phone]   Louis Matte 05/05/2021, 3:15 PM

## 2021-05-05 NOTE — Progress Notes (Signed)
Patient ID: NAREK KNISS, male   DOB: 19-May-1940, 81 y.o.   MRN: [id number] Follow up - Trauma Critical Care   Patient Details:    DORRIS VANGORDER is an 81 y.o. male.  Lines/tubes : External Urinary Catheter (Active)  Collection Container Standard drainage bag 05/05/21 0800  Suction (Verified suction is between 40-80 mmHg) N/A (Patient has condom catheter) 05/05/21 0800  Site Assessment Intact 05/05/21 0800  Output (mL) 150 mL 05/05/21 0400    Microbiology/Sepsis markers: Results for orders placed or performed during the hospital encounter of 05/03/21  Resp Panel by RT-PCR (Flu A&B, Covid) Nasopharyngeal Swab     Status: None   Collection Time: 05/03/21  5:55 PM   Specimen: Nasopharyngeal Swab; Nasopharyngeal(NP) swabs in vial transport medium  Result Value Ref Range Status   SARS Coronavirus 2 by RT PCR NEGATIVE NEGATIVE Final    Comment: (NOTE) SARS-CoV-2 target nucleic acids are NOT DETECTED.  The SARS-CoV-2 RNA is generally detectable in upper respiratory specimens during the acute phase of infection. The lowest concentration of SARS-CoV-2 viral copies this assay can detect is 138 copies/mL. A negative result does not preclude SARS-Cov-2 infection and should not be used as the sole basis for treatment or other patient management decisions. A negative result may occur with  improper specimen collection/handling, submission of specimen other than nasopharyngeal swab, presence of viral mutation(s) within the areas targeted by this assay, and inadequate number of viral copies(<138 copies/mL). A negative result must be combined with clinical observations, patient history, and epidemiological information. The expected result is Negative.  Fact Sheet for Patients:  EntrepreneurPulse.com.au  Fact Sheet for Healthcare Providers:  IncredibleEmployment.be  This test is no t yet approved or cleared by the [place] FDA and  has been authorized for  detection and/or diagnosis of SARS-CoV-2 by FDA under an Emergency Use Authorization (EUA). This EUA will remain  in effect (meaning this test can be used) for the duration of the COVID-19 declaration under Section 564(b)(1) of the Act, 21 U.S.C.section 360bbb-3(b)(1), unless the authorization is terminated  or revoked sooner.       Influenza A by PCR NEGATIVE NEGATIVE Final   Influenza B by PCR NEGATIVE NEGATIVE Final    Comment: (NOTE) The Xpert Xpress SARS-CoV-2/FLU/RSV plus assay is intended as an aid in the diagnosis of influenza from Nasopharyngeal swab specimens and should not be used as a sole basis for treatment. Nasal washings and aspirates are unacceptable for Xpert Xpress SARS-CoV-2/FLU/RSV testing.  Fact Sheet for Patients: EntrepreneurPulse.com.au  Fact Sheet for Healthcare Providers: IncredibleEmployment.be  This test is not yet approved or cleared by the [place] FDA and has been authorized for detection and/or diagnosis of SARS-CoV-2 by FDA under an Emergency Use Authorization (EUA). This EUA will remain in effect (meaning this test can be used) for the duration of the COVID-19 declaration under Section 564(b)(1) of the Act, 21 U.S.C. section 360bbb-3(b)(1), unless the authorization is terminated or revoked.  Performed at [facility], [address], [place], [place] [address]   MRSA Next Gen by PCR, Nasal     Status: None   Collection Time: 05/03/21  8:47 PM   Specimen: Nasal Mucosa; Nasal Swab  Result Value Ref Range Status   MRSA by PCR Next Gen NOT DETECTED NOT DETECTED Final    Comment: (NOTE) The GeneXpert MRSA Assay (FDA approved for NASAL specimens only), is one component of a comprehensive MRSA colonization surveillance program. It is not intended to diagnose MRSA infection  nor to guide or monitor treatment for MRSA infections. Test performance is not FDA approved in patients less than 1  years old. Performed at [facility], [place] [address] [place], [place] [address]     Anti-infectives:  Anti-infectives (From admission, onward)    None       Best Practice/Protocols:  VTE Prophylaxis: Mechanical .  Consults:     Studies:    Events:  Subjective:    Overnight Issues: 1u PRBC going now  Objective:  Vital signs for last 24 hours: Temp:  [97.6 F (36.4 C)-98.3 F (36.8 C)] 98 F (36.7 C) (01/26 0800) Pulse Rate:  [62-79] 62 (01/26 0800) Resp:  [10-23] 12 (01/26 0800) BP: (83-134)/(45-57) 98/56 (01/26 0800) SpO2:  [96 %-100 %] 99 % (01/26 0800) Weight:  [50.8 kg] 50.8 kg (01/25 1000)  Hemodynamic parameters for last 24 hours:    Intake/Output from previous day: 01/25 0701 - 01/26 0700 In: 2251.5 [P.O.:200; I.V.:2051.5] Out: 600 [Urine:600]  Intake/Output this shift: Total I/O In: 100 [I.V.:100] Out: -   Vent settings for last 24 hours:    Physical Exam:  General: alert and no respiratory distress Neuro: alert, oriented, and F/C HEENT/Neck: no JVD Resp: clear to auscultation bilaterally CVS: RRR GI: soft, no gen tend, very tender R ASIS area Extremities: claves soft  Results for orders placed or performed during the hospital encounter of 05/03/21 (from the past 24 hour(s))  Glucose, capillary     Status: Abnormal   Collection Time: 05/04/21 11:36 AM  Result Value Ref Range   Glucose-Capillary 100 (H) 70 - 99 mg/dL  CBC     Status: Abnormal   Collection Time: 05/04/21  2:59 PM  Result Value Ref Range   WBC 12.0 (H) 4.0 - 10.5 K/uL   RBC 2.63 (L) 4.22 - 5.81 MIL/uL   Hemoglobin 8.2 (L) 13.0 - 17.0 g/dL   HCT 24.7 (L) 39.0 - 52.0 %   MCV 93.9 80.0 - 100.0 fL   MCH 31.2 26.0 - 34.0 pg   MCHC 33.2 30.0 - 36.0 g/dL   RDW 16.0 (H) 11.5 - 15.5 %   Platelets 92 (L) 150 - 400 K/uL   nRBC 0.0 0.0 - 0.2 %  Glucose, capillary     Status: Abnormal   Collection Time: 05/04/21  4:57 PM  Result Value Ref Range    Glucose-Capillary 167 (H) 70 - 99 mg/dL  Glucose, capillary     Status: Abnormal   Collection Time: 05/04/21  8:28 PM  Result Value Ref Range   Glucose-Capillary 129 (H) 70 - 99 mg/dL  Glucose, capillary     Status: Abnormal   Collection Time: 05/05/21 12:00 AM  Result Value Ref Range   Glucose-Capillary 132 (H) 70 - 99 mg/dL  CBC     Status: Abnormal   Collection Time: 05/05/21  3:36 AM  Result Value Ref Range   WBC 9.5 4.0 - 10.5 K/uL   RBC 2.03 (L) 4.22 - 5.81 MIL/uL   Hemoglobin 6.2 (LL) 13.0 - 17.0 g/dL   HCT 19.3 (L) 39.0 - 52.0 %   MCV 95.1 80.0 - 100.0 fL   MCH 30.5 26.0 - 34.0 pg   MCHC 32.1 30.0 - 36.0 g/dL   RDW 16.4 (H) 11.5 - 15.5 %   Platelets 86 (L) 150 - 400 K/uL   nRBC 0.0 0.0 - 0.2 %  Basic metabolic panel     Status: Abnormal   Collection Time: 05/05/21  3:36  AM  Result Value Ref Range   Sodium 139 135 - 145 mmol/L   Potassium 3.9 3.5 - 5.1 mmol/L   Chloride 111 98 - 111 mmol/L   CO2 23 22 - 32 mmol/L   Glucose, Bld 176 (H) 70 - 99 mg/dL   BUN 26 (H) 8 - 23 mg/dL   Creatinine, Ser 1.37 (H) 0.61 - 1.24 mg/dL   Calcium 7.8 (L) 8.9 - 10.3 mg/dL   GFR, Estimated 52 (L) >60 mL/min   Anion gap 5 5 - 15  Glucose, capillary     Status: Abnormal   Collection Time: 05/05/21  4:14 AM  Result Value Ref Range   Glucose-Capillary 155 (H) 70 - 99 mg/dL  Prepare RBC (crossmatch)     Status: None   Collection Time: 05/05/21  5:07 AM  Result Value Ref Range   Order Confirmation      ORDER PROCESSED BY BLOOD BANK Performed at [facility], [address] [place], [place] [address]   Type and screen [place]     Status: None (Preliminary result)   Collection Time: 05/05/21  5:13 AM  Result Value Ref Range   ABO/RH(D) O POS    Antibody Screen NEG    Sample Expiration 05/08/2021,2359    Unit Number [id number]    Blood Component Type RED CELLS,LR    Unit division 00    Status of Unit ISSUED    Transfusion Status OK TO TRANSFUSE     Crossmatch Result      Compatible Performed at [facility], [place] [address] [place], [place] [address]   ABO/Rh     Status: None   Collection Time: 05/05/21  5:25 AM  Result Value Ref Range   ABO/RH(D)      O POS Performed at [facility] [address], [place], [place] [address]   Glucose, capillary     Status: Abnormal   Collection Time: 05/05/21  7:44 AM  Result Value Ref Range   Glucose-Capillary 154 (H) 70 - 99 mg/dL    Assessment & Plan: Present on Admission:  Traumatic retroperitoneal hematoma    LOS: 2 days   Additional comments:I reviewed the patient's new clinical lab test results. . Fall from step ladder 4am 1/24 R 10th rib FX - pain control and pulmonary toilet, F/U CXR OK Complex R iliac FX with large hematoma - Trauma Ortho eval noted, WBAT, Hb dropped so 1u PRBC going now ABL anemia - above, CBC at 1300 Thrombocytopenia - consumptive, 86k DM - SSI CKD 3a VTE - no Lovenox until Hb stable and PLTs over 100k FEN - carb mod, IVF at 50 until taking PO better Dispo - ICU Critical Care Total Time*: 34 Minutes  Georganna Skeans, MD, MPH, FACS Trauma & General Surgery Use [email or web address] to contact on call provider  05/05/2021  *Care during the described time interval was provided by me. I have reviewed this patient's available data, including medical history, events of note, physical examination and test results as part of my evaluation.

## 2021-05-06 LAB — BASIC METABOLIC PANEL
Anion gap: 4 — ABNORMAL LOW (ref 5–15)
BUN: 17 mg/dL (ref 8–23)
CO2: 26 mmol/L (ref 22–32)
Calcium: 7.7 mg/dL — ABNORMAL LOW (ref 8.9–10.3)
Chloride: 109 mmol/L (ref 98–111)
Creatinine, Ser: 1.14 mg/dL (ref 0.61–1.24)
GFR, Estimated: >60 mL/min (ref >60)
Glucose, Bld: 107 mg/dL — ABNORMAL HIGH (ref 70–99)
Potassium: 4 mmol/L (ref 3.5–5.1)
Sodium: 139 mmol/L (ref 135–145)

## 2021-05-06 LAB — BPAM RBC
Blood Product Expiration Date: 202302022359
ISSUE DATE / TIME: 202301260637
Unit Type and Rh: 5100

## 2021-05-06 LAB — TYPE AND SCREEN
ABO/RH(D): O POS
Antibody Screen: NEGATIVE
Unit division: 0

## 2021-05-06 LAB — CBC
HCT: 23.8 % — ABNORMAL LOW (ref 39.0–52.0)
Hemoglobin: 7.7 g/dL — ABNORMAL LOW (ref 13.0–17.0)
MCH: 30 pg (ref 26.0–34.0)
MCHC: 32.4 g/dL (ref 30.0–36.0)
MCV: 92.6 fL (ref 80.0–100.0)
Platelets: 84 10*3/uL — ABNORMAL LOW (ref 150–400)
RBC: 2.57 MIL/uL — ABNORMAL LOW (ref 4.22–5.81)
RDW: 17.2 % — ABNORMAL HIGH (ref 11.5–15.5)
WBC: 12.5 10*3/uL — ABNORMAL HIGH (ref 4.0–10.5)
nRBC: 0 % (ref 0.0–0.2)

## 2021-05-06 LAB — GLUCOSE, CAPILLARY
Glucose-Capillary: 112 mg/dL — ABNORMAL HIGH (ref 70–99)
Glucose-Capillary: 129 mg/dL — ABNORMAL HIGH (ref 70–99)
Glucose-Capillary: 234 mg/dL — ABNORMAL HIGH (ref 70–99)
Glucose-Capillary: 249 mg/dL — ABNORMAL HIGH (ref 70–99)
Glucose-Capillary: 274 mg/dL — ABNORMAL HIGH (ref 70–99)
Glucose-Capillary: 98 mg/dL (ref 70–99)

## 2021-05-06 NOTE — TOC Initial Note (Signed)
Transition of Care [facility]) - Initial/Assessment Note    Patient Details  Name: Jacob Barnes MRN: [id number] Date of Birth: [birth year]  Transition of Care [facility]) CM/SW Contact:    Ella Bodo, RN Phone Number: 05/06/2021, 4:36 PM  Clinical Narrative:                 81 yo male presents to ED On 1/24 after fall off step ladder, sustaining retroperitoneal hematoma and possible extravasation, R 10th rib fracture, R iliac fracture. PMH includes dementia, DM with retinopathy, HTN, carpal tunnel, HTN, HLD. PTA, pt independent and living at home with spouse.  Spoke with patient's son, Evangeline Gula, and sister Devern on speaker phone to discuss discharge arrangements.  Son states patient lives with wife, but she cares for others at another location at times.  He states that patient will have assistance at discharge from his spouse and multiple other family members.  PT/OT recommending HH follow up, RW for home, and son agreeable to services.  Referral faxed to [facility] for therapy follow up at dc; referral to [facility] for RW, to be delivered to room prior to dc.  DC address, per son, is [address], [place] [address]; phone: [phone].    Expected Discharge Plan: [place] Barriers to Discharge: Continued Medical Work up   Patient Goals and CMS Choice Patient states their goals for this hospitalization and ongoing recovery are:: to go home CMS Medicare.gov Compare Post Acute Care list provided to:: Patient Represenative (must comment) (son) Choice offered to / list presented to : Adult Children  Expected Discharge Plan and Services Expected Discharge Plan: [place]   Discharge Planning Services: CM Consult Post Acute Care Choice: Home Health                   DME Arranged: Walker rolling DME Agency: [employer] Date DME Agency Contacted: 05/06/21 Time DME Agency Contacted: [phone] Representative spoke with at DME Agency: Freda Munro HH Arranged:  PT, OT [place] Agency: [place] Date [place]: 05/06/21 Time [place]: [place] Representative spoke with at [place]: [place] Arrangements/Services     Patient language and need for interpreter reviewed:: Yes Do you feel safe going back to the place where you live?: Yes      Need for Family Participation in Patient Care: Yes (Comment) Care giver support system in place?: Yes (comment) Current home services: DME Criminal Activity/Legal Involvement Pertinent to Current Situation/Hospitalization: No - Comment as needed  Activities of Daily Living      Permission Sought/Granted                  Emotional Assessment   Attitude/Demeanor/Rapport: Engaged Affect (typically observed): Accepting Orientation: : Oriented to Self, Oriented to Place, Oriented to  Time, Oriented to Situation      Admission diagnosis:  Rib fractures [S22.49XA] Traumatic retroperitoneal hematoma [S36.892A] Patient Active Problem List   Diagnosis Date Noted   Traumatic retroperitoneal hematoma 05/03/2021   Dementia ([place]) 01/24/2021   Frequent stools 06/18/2020   At high risk for falls 04/09/2020   Uncontrolled type 2 diabetes mellitus with hyperglycemia ([place]) 01/28/2020   Osteopenia 01/28/2020   Compression fracture of L1 lumbar vertebra ([place]) 01/09/2020   After-cataract with vision obscured 02/10/2019   Diabetic macular edema ([place]) 02/10/2019   Dry eye syndrome of bilateral lacrimal glands 02/10/2019   Lens replaced by other means 02/10/2019  Open-angle glaucoma 02/10/2019   Presence of intraocular lens 02/10/2019   Unspecified visual loss 02/10/2019   Type 2 diabetes mellitus with proliferative retinopathy of both eyes and macular edema ([place]) 02/10/2019   Type 2 diabetes mellitus with vascular disease ([place]) 10/20/2018   Bilateral pseudophakia 02/08/2016   Proliferative retinopathy with retinal edema due to type 2 diabetes mellitus ([place])  02/08/2016   Primary open angle glaucoma of both eyes 02/08/2016   CKD (chronic kidney disease) stage 3, GFR 30-59 ml/min (HCC) 12/15/2013   Mild protein-calorie malnutrition ([place]) 12/11/2013   Mixed hyperlipidemia 07/16/2013   GERD (gastroesophageal reflux disease) 06/27/2012   Background diabetic retinopathy ([place]) 03/28/2012   Retinal edema 03/28/2012   Tear film insufficiency 08/07/2011   Borderline glaucoma, open angle with borderline findings 03/22/2011   Essential hypertension, benign 01/02/2009   Type 2 or unspecified type diabetes mellitus, uncontrolled 12/21/2008   PCP:  Fayrene Helper, MD Pharmacy:   [id number] [place], [place] [address] Phone: [phone] Fax: [phone]     Social Determinants of Health (SDOH) Interventions    Readmission Risk Interventions No flowsheet data found.

## 2021-05-06 NOTE — Progress Notes (Signed)
Physical Therapy Treatment Patient Details Name: Jacob Barnes MRN: [id number] DOB: 07/29/1940 Today's Date: 05/06/2021   History of Present Illness 81 yo male presents to ED On 1/24 after fall off step ladder, sustaining retroperitoneal hematoma and possible extravasation, R 10th rib fracture, R iliac fracture. PMH includes dementia, DM with retinopathy, HTN, carpal tunnel, HTN, HLD.    PT Comments    Pt progressing well with activity tolerance, reports pain along R flank as similar to mobility yesterday but gait appears less antalgic. PT reiterated pt has good support at home, will have wife, children, and other family members to assist him once home. VSS during session. PT to continue to follow.     Recommendations for follow up therapy are one component of a multi-disciplinary discharge planning process, led by the attending physician.  Recommendations may be updated based on patient status, additional functional criteria and insurance authorization.  Follow Up Recommendations  Home health PT     Assistance Recommended at Discharge Frequent or constant Supervision/Assistance (from wife and/or daughter)  Patient can return home with the following A little help with walking and/or transfers;Assist for transportation;Help with stairs or ramp for entrance   Equipment Recommendations  Rolling walker (2 wheels)    Recommendations for Other Services       Precautions / Restrictions Precautions Precautions: Fall Restrictions Weight Bearing Restrictions: No RLE Weight Bearing: Weight bearing as tolerated     Mobility  Bed Mobility               General bed mobility comments: up in recliner    Transfers Overall transfer level: Needs assistance Equipment used: Rolling walker (2 wheels) Transfers: Sit to/from Stand Sit to Stand: Min assist           General transfer comment: light assist for steadying upon standing    Ambulation/Gait Ambulation/Gait assistance: Min  guard Gait Distance (Feet): 70 Feet Assistive device: Rolling walker (2 wheels) Gait Pattern/deviations: Step-through pattern, Decreased stride length, Narrow base of support Gait velocity: decr     General Gait Details: close guard for safety, verbal cuing for upright posture, placement in RW.   Stairs             Wheelchair Mobility    Modified Rankin (Stroke Patients Only)       Balance Overall balance assessment: History of Falls, Needs assistance Sitting-balance support: No upper extremity supported, Feet supported Sitting balance-Leahy Scale: Good     Standing balance support: Bilateral upper extremity supported, During functional activity Standing balance-Leahy Scale: Poor Standing balance comment: reliant on external support                            Cognition Arousal/Alertness: Awake/alert Behavior During Therapy: WFL for tasks assessed/performed Overall Cognitive Status: Within Functional Limits for tasks assessed                                          Exercises      General Comments        Pertinent Vitals/Pain Pain Assessment Pain Assessment: Faces Faces Pain Scale: Hurts little more Pain Location: R side Pain Descriptors / Indicators: Sore, Discomfort, Sharp Pain Intervention(s): Limited activity within patient's tolerance, Monitored during session, Repositioned    Home Living  Prior Function            PT Goals (current goals can now be found in the care plan section) Acute Rehab PT Goals Patient Stated Goal: home PT Goal Formulation: With patient Time For Goal Achievement: 05/19/21 Potential to Achieve Goals: Good Progress towards PT goals: Progressing toward goals    Frequency    Min 4X/week      PT Plan Current plan remains appropriate    Co-evaluation              AM-PAC PT "6 Clicks" Mobility   Outcome Measure  Help needed turning from your  back to your side while in a flat bed without using bedrails?: A Little Help needed moving from lying on your back to sitting on the side of a flat bed without using bedrails?: A Little Help needed moving to and from a bed to a chair (including a wheelchair)?: A Little Help needed standing up from a chair using your arms (e.g., wheelchair or bedside chair)?: A Little Help needed to walk in hospital room?: A Little Help needed climbing 3-5 steps with a railing? : A Lot 6 Click Score: 17    End of Session   Activity Tolerance: Patient tolerated treatment well;Patient limited by pain Patient left: in chair;with call bell/phone within reach;with chair alarm set Nurse Communication: Mobility status PT Visit Diagnosis: Other abnormalities of gait and mobility (R26.89);Muscle weakness (generalized) (M62.81)     Time: 1308-6578 PT Time Calculation (min) (ACUTE ONLY): 11 min  Charges:  $Gait Training: 8-22 mins                    Stacie Glaze, PT DPT Acute Rehabilitation Services Pager [phone]  Office [phone]   [place] 05/06/2021, 5:06 PM

## 2021-05-06 NOTE — Progress Notes (Signed)
Pt arrived to 4[facility]-11 from [facility] ICU with 1 bag of belongings. Pt A&O x 4 denies pain at this time. Pt oriented to unit. Sitting up in chair alarm in place call bell within reach.

## 2021-05-06 NOTE — Evaluation (Signed)
Occupational Therapy Evaluation Patient Details Name: Jacob Barnes MRN: [id number] DOB: [birth year] Today's Date: 05/06/2021   History of Present Illness 81 yo male presents to ED On 1/24 after fall off step ladder, sustaining retroperitoneal hematoma and possible extravasation, R 10th rib fracture, R iliac fracture. PMH includes dementia, DM with retinopathy, HTN, carpal tunnel, HTN, HLD.   Clinical Impression   Patient admitted for the diagnosis above.  PTA he lived alone with PRN assist from family.  His son and daughter live nearby, and he has a spouse that can assist as needed, she however, lives in another home, but can stay with him for a short period of time.  Primary deficit is pain to ribs and R leg.  Currently he is needing up to Min A for basic mobility at RW level, and up to Mod A for lower body ADL from sit/stand level.  OT to continue efforts in the acute setting, but home with [facility] OT and assist from family is recommended when cleared medically.        Recommendations for follow up therapy are one component of a multi-disciplinary discharge planning process, led by the attending physician.  Recommendations may be updated based on patient status, additional functional criteria and insurance authorization.   Follow Up Recommendations  Home health OT    Assistance Recommended at Discharge Intermittent Supervision/Assistance  Patient can return home with the following      Functional Status Assessment  Patient has had a recent decline in their functional status and demonstrates the ability to make significant improvements in function in a reasonable and predictable amount of time.  Equipment Recommendations  BSC/3in1    Recommendations for Other Services       Precautions / Restrictions Precautions Precautions: Fall Restrictions Weight Bearing Restrictions: No RLE Weight Bearing: Weight bearing as tolerated      Mobility Bed Mobility Overal bed mobility: Needs  Assistance Bed Mobility: Supine to Sit   Sidelying to sit: Min assist, HOB elevated            Transfers Overall transfer level: Needs assistance Equipment used: Rolling walker (2 wheels) Transfers: Sit to/from Stand, Bed to chair/wheelchair/BSC Sit to Stand: Min assist     Step pivot transfers: Min assist            Balance Overall balance assessment: History of Falls, Needs assistance Sitting-balance support: No upper extremity supported, Feet supported Sitting balance-Leahy Scale: Good     Standing balance support: Bilateral upper extremity supported, During functional activity Standing balance-Leahy Scale: Poor Standing balance comment: reliant on external support                           ADL either performed or assessed with clinical judgement   ADL       Grooming: Wash/dry hands;Wash/dry face;Set up;Sitting   Upper Body Bathing: Min guard;Sitting;Standing   Lower Body Bathing: Moderate assistance;Sit to/from stand       Lower Body Dressing: Moderate assistance;Sit to/from stand   Toilet Transfer: Minimal assistance;BSC/3in1;Stand-pivot                   Vision   Vision Assessment?: No apparent visual deficits     Perception Perception Perception: Not tested   Praxis Praxis Praxis: Not tested    Pertinent Vitals/Pain Pain Assessment Pain Assessment: Faces Faces Pain Scale: Hurts little more Pain Location: R side Pain Descriptors / Indicators: Sore, Discomfort, Sharp Pain Intervention(s): Monitored  during session     Hand Dominance Right   Extremity/Trunk Assessment Upper Extremity Assessment Upper Extremity Assessment: Overall WFL for tasks assessed   Lower Extremity Assessment Lower Extremity Assessment: Defer to PT evaluation   Cervical / Trunk Assessment Cervical / Trunk Assessment: Normal   Communication Communication Communication: No difficulties;HOH   Cognition Arousal/Alertness: Awake/alert Behavior  During Therapy: WFL for tasks assessed/performed Overall Cognitive Status: Within Functional Limits for tasks assessed                                       General Comments   VSS on RA    Exercises     Shoulder Instructions      Home Living Family/patient expects to be discharged to:: Private residence Living Arrangements: Alone;Spouse/significant other;Children Available Help at Discharge: Family;Available 24 hours/day Type of Home: House Home Access: Stairs to enter [employer] of Steps: 4 Entrance Stairs-Rails: Right;Left;Can reach both Home Layout: Able to live on main level with bedroom/bathroom     Bathroom Shower/Tub: Walk-in shower;Tub/shower unit   Armed forces training and education officer: Yes How Accessible: Accessible via walker Home Equipment: Shower seat;Cane - single point          Prior Functioning/Environment Prior Level of Function : Independent/Modified Independent               ADLs Comments: son/daughter assisted with community mobility and bill payment.        OT Problem List: Decreased activity tolerance;Impaired balance (sitting and/or standing);Decreased safety awareness;Pain      OT Treatment/Interventions: Self-care/ADL training;Balance training;Therapeutic activities;Patient/family education;DME and/or AE instruction    OT Goals(Current goals can be found in the care plan section) Acute Rehab OT Goals Patient Stated Goal: Return home with helpo from family OT Goal Formulation: With patient Time For Goal Achievement: 05/20/21 Potential to Achieve Goals: Good ADL Goals Pt Will Perform Grooming: with supervision;standing Pt Will Perform Upper Body Bathing: with set-up;sitting Pt Will Perform Lower Body Bathing: with supervision;sit to/from stand Pt Will Perform Lower Body Dressing: with supervision;sit to/from stand Pt Will Transfer to Toilet: with supervision;ambulating;regular height toilet   OT Frequency: Min 2X/week    Co-evaluation              AM-PAC OT "6 Clicks" Daily Activity     Outcome Measure Help from another person eating meals?: None Help from another person taking care of personal grooming?: None Help from another person toileting, which includes using toliet, bedpan, or urinal?: A Little Help from another person bathing (including washing, rinsing, drying)?: A Lot Help from another person to put on and taking off regular upper body clothing?: None Help from another person to put on and taking off regular lower body clothing?: A Lot 6 Click Score: 19   End of Session Equipment Utilized During Treatment: Rolling walker (2 wheels)  Activity Tolerance: Patient tolerated treatment well Patient left: in chair;with call bell/phone within reach;with chair alarm set  OT Visit Diagnosis: Unsteadiness on feet (R26.81);Pain Pain - Right/Left: Right Pain - part of body: Leg                Time: 6948-5462 OT Time Calculation (min): 21 min Charges:  OT General Charges $OT Visit: 1 Visit OT Evaluation $OT Eval Moderate Complexity: 1 Mod  05/06/2021  RP, OTR/L  Acute Rehabilitation Services  Office:  [phone]   Metta Clines 05/06/2021, 10:05 AM

## 2021-05-06 NOTE — Progress Notes (Signed)
Trauma/Critical Care Follow Up Note  Subjective:    Overnight Issues:   Objective:  Vital signs for last 24 hours: Temp:  [97.7 F (36.5 C)-98.7 F (37.1 C)] 97.7 F (36.5 C) (01/27 0400) Pulse Rate:  [58-87] 65 (01/27 0700) Resp:  [12-31] 14 (01/27 0700) BP: (86-150)/(50-125) 124/68 (01/27 0700) SpO2:  [83 %-100 %] 100 % (01/27 0700)  Hemodynamic parameters for last 24 hours:    Intake/Output from previous day: 01/26 0701 - 01/27 0700 In: 1626.2 [P.O.:170; I.V.:1099.5; Blood:356.7] Out: 1150 [Urine:1150]  Intake/Output this shift: No intake/output data recorded.  Vent settings for last 24 hours:    Physical Exam:  Gen: comfortable, no distress Neuro: non-focal exam HEENT: PERRL Neck: supple CV: RRR Pulm: unlabored breathing Abd: soft, NT GU: clear yellow urine Extr: wwp, no edema, fullness and tenderness of R hip   Results for orders placed or performed during the hospital encounter of 05/03/21 (from the past 24 hour(s))  Glucose, capillary     Status: Abnormal   Collection Time: 05/05/21  7:44 AM  Result Value Ref Range   Glucose-Capillary 154 (H) 70 - 99 mg/dL  CBC     Status: Abnormal   Collection Time: 05/05/21 10:19 AM  Result Value Ref Range   WBC 10.0 4.0 - 10.5 K/uL   RBC 2.84 (L) 4.22 - 5.81 MIL/uL   Hemoglobin 8.5 (L) 13.0 - 17.0 g/dL   HCT 26.5 (L) 39.0 - 52.0 %   MCV 93.3 80.0 - 100.0 fL   MCH 29.9 26.0 - 34.0 pg   MCHC 32.1 30.0 - 36.0 g/dL   RDW 16.1 (H) 11.5 - 15.5 %   Platelets 86 (L) 150 - 400 K/uL   nRBC 0.0 0.0 - 0.2 %  Glucose, capillary     Status: Abnormal   Collection Time: 05/05/21 11:26 AM  Result Value Ref Range   Glucose-Capillary 114 (H) 70 - 99 mg/dL  Glucose, capillary     Status: Abnormal   Collection Time: 05/05/21  3:37 PM  Result Value Ref Range   Glucose-Capillary 157 (H) 70 - 99 mg/dL  Glucose, capillary     Status: Abnormal   Collection Time: 05/05/21  7:45 PM  Result Value Ref Range   Glucose-Capillary  270 (H) 70 - 99 mg/dL  Glucose, capillary     Status: Abnormal   Collection Time: 05/05/21 11:17 PM  Result Value Ref Range   Glucose-Capillary 130 (H) 70 - 99 mg/dL  Glucose, capillary     Status: None   Collection Time: 05/06/21  3:14 AM  Result Value Ref Range   Glucose-Capillary 98 70 - 99 mg/dL  CBC     Status: Abnormal   Collection Time: 05/06/21  3:56 AM  Result Value Ref Range   WBC 12.5 (H) 4.0 - 10.5 K/uL   RBC 2.57 (L) 4.22 - 5.81 MIL/uL   Hemoglobin 7.7 (L) 13.0 - 17.0 g/dL   HCT 23.8 (L) 39.0 - 52.0 %   MCV 92.6 80.0 - 100.0 fL   MCH 30.0 26.0 - 34.0 pg   MCHC 32.4 30.0 - 36.0 g/dL   RDW 17.2 (H) 11.5 - 15.5 %   Platelets 84 (L) 150 - 400 K/uL   nRBC 0.0 0.0 - 0.2 %  Basic metabolic panel     Status: Abnormal   Collection Time: 05/06/21  3:56 AM  Result Value Ref Range   Sodium 139 135 - 145 mmol/L   Potassium 4.0 3.5 - 5.1 mmol/L  Chloride 109 98 - 111 mmol/L   CO2 26 22 - 32 mmol/L   Glucose, Bld 107 (H) 70 - 99 mg/dL   BUN 17 8 - 23 mg/dL   Creatinine, Ser 1.14 0.61 - 1.24 mg/dL   Calcium 7.7 (L) 8.9 - 10.3 mg/dL   GFR, Estimated >60 >60 mL/min   Anion gap 4 (L) 5 - 15    Assessment & Plan: The plan of care was discussed with the bedside nurse for the day, who is in agreement with this plan and no additional concerns were raised.   Present on Admission:  Traumatic retroperitoneal hematoma    LOS: 3 days   Additional comments:I reviewed the patient's new clinical lab test results.   and I reviewed the patients new imaging test results.    Fall from step ladder 4am 1/24  R 10th rib FX - pain control and pulmonary toilet, F/U CXR OK Complex R iliac FX with large hematoma - 1u pRBC yest, hgb hasnt quite stabilized but appropriate response. BP and HR normal. Recheck in AM.  ABL anemia - above Thrombocytopenia - consumptive, 84k, monitor DM - SSI CKD 3a VTE - no Lovenox until Hb stable and PLTs over 100k FEN - carb mod, IVF at 50 until taking PO  better Dispo - ICU  Jesusita Oka, MD Trauma & General Surgery Please use [email or web address] to contact on call provider  05/06/2021  *Care during the described time interval was provided by me. I have reviewed this patient's available data, including medical history, events of note, physical examination and test results as part of my evaluation.

## 2021-05-07 LAB — BASIC METABOLIC PANEL
Anion gap: 4 — ABNORMAL LOW (ref 5–15)
BUN: 14 mg/dL (ref 8–23)
CO2: 25 mmol/L (ref 22–32)
Calcium: 7.9 mg/dL — ABNORMAL LOW (ref 8.9–10.3)
Chloride: 108 mmol/L (ref 98–111)
Creatinine, Ser: 1.14 mg/dL (ref 0.61–1.24)
GFR, Estimated: >60 mL/min (ref >60)
Glucose, Bld: 183 mg/dL — ABNORMAL HIGH (ref 70–99)
Potassium: 3.8 mmol/L (ref 3.5–5.1)
Sodium: 137 mmol/L (ref 135–145)

## 2021-05-07 LAB — CBC
HCT: 22.3 % — ABNORMAL LOW (ref 39.0–52.0)
Hemoglobin: 7.3 g/dL — ABNORMAL LOW (ref 13.0–17.0)
MCH: 30.3 pg (ref 26.0–34.0)
MCHC: 32.7 g/dL (ref 30.0–36.0)
MCV: 92.5 fL (ref 80.0–100.0)
Platelets: 93 10*3/uL — ABNORMAL LOW (ref 150–400)
RBC: 2.41 MIL/uL — ABNORMAL LOW (ref 4.22–5.81)
RDW: 16.6 % — ABNORMAL HIGH (ref 11.5–15.5)
WBC: 11.1 10*3/uL — ABNORMAL HIGH (ref 4.0–10.5)
nRBC: 0 % (ref 0.0–0.2)

## 2021-05-07 LAB — GLUCOSE, CAPILLARY
Glucose-Capillary: 170 mg/dL — ABNORMAL HIGH (ref 70–99)
Glucose-Capillary: 81 mg/dL (ref 70–99)
Glucose-Capillary: 153 mg/dL — ABNORMAL HIGH (ref 70–99)
Glucose-Capillary: 294 mg/dL — ABNORMAL HIGH (ref 70–99)
Glucose-Capillary: 204 mg/dL — ABNORMAL HIGH (ref 70–99)
Glucose-Capillary: 198 mg/dL — ABNORMAL HIGH (ref 70–99)

## 2021-05-07 MED ORDER — BOOST / RESOURCE BREEZE PO LIQD CUSTOM
1.0000 | Freq: Three times a day (TID) | ORAL | Status: DC
  Administered 2021-05-07 – 2021-05-09 (×6): 1 via ORAL

## 2021-05-07 NOTE — Progress Notes (Addendum)
Mobility Specialist Progress Note   05/07/21 1800  Mobility  Activity Ambulated with assistance in room  Level of Assistance Contact guard assist, steadying assist  Assistive Device Front wheel walker  RLE Weight Bearing WBAT  Distance Ambulated (ft) 342 ft  Activity Response Tolerated well  $Mobility charge 1 Mobility   Pt presented w/ increased pain tolerance today, able to perform good ROM in RLE even though voicing pain in RLQ as a 8/10. PT requiring mod cues for proper technique on log roll. Antalgic gait while ambulating bc of pain but gait slowly steadied out. Returned BTB w/o further complaint , all needs met and family in the room.    Holland Falling Mobility Specialist Phone Number [phone]

## 2021-05-07 NOTE — Progress Notes (Signed)
Occupational Therapy Treatment Patient Details Name: Jacob Barnes MRN: [id number] DOB: [birth year] Today's Date: 05/07/2021   History of present illness 81 yo male presents to ED On 1/24 after fall off step ladder, sustaining retroperitoneal hematoma and possible extravasation, R 10th rib fracture, R iliac fracture. PMH includes dementia, DM with retinopathy, HTN, carpal tunnel, HTN, HLD.   OT comments  Patient with good progress toward patient focused goals.  Able to complete bed mobility with Min Guard and cues for log rolling.  Patient mobilizing to the bathroom with RW and supervision.  Min Guard for lower body ADL from sit/stand level.  OT can continue efforts in the acute setting, but no post acute OT is anticipated given family support.     Recommendations for follow up therapy are one component of a multi-disciplinary discharge planning process, led by the attending physician.  Recommendations may be updated based on patient status, additional functional criteria and insurance authorization.    Follow Up Recommendations  No OT follow up    Assistance Recommended at Discharge Intermittent Supervision/Assistance  Patient can return home with the following      Equipment Recommendations  BSC/3in1    Recommendations for Other Services      Precautions / Restrictions Precautions Precautions: Fall Restrictions Weight Bearing Restrictions: No RLE Weight Bearing: Weight bearing as tolerated       Mobility Bed Mobility Overal bed mobility: Needs Assistance Bed Mobility: Sidelying to Sit, Sit to Sidelying   Sidelying to sit: Min guard     Sit to sidelying: Min guard      Transfers Overall transfer level: Needs assistance Equipment used: Rolling walker (2 wheels) Transfers: Sit to/from Stand Sit to Stand: Min guard                 Balance   Sitting-balance support: No upper extremity supported, Feet supported Sitting balance-Leahy Scale: Good     Standing  balance support: Bilateral upper extremity supported, During functional activity Standing balance-Leahy Scale: Poor Standing balance comment: reliant on external support                           ADL either performed or assessed with clinical judgement   ADL       Grooming: Wash/dry hands;Wash/dry face;Supervision/safety;Standing               Lower Body Dressing: Sit to/from stand;Min guard   Toilet Transfer: Supervision/safety;Rolling walker (2 wheels);Ambulation;Regular Toilet                  Extremity/Trunk Assessment                                Cognition Arousal/Alertness: Awake/alert Behavior During Therapy: WFL for tasks assessed/performed Overall Cognitive Status: Within Functional Limits for tasks assessed                                                             Pertinent Vitals/ Pain       Pain Assessment Pain Assessment: Faces Faces Pain Scale: Hurts a little bit Pain Location: R side Pain Descriptors / Indicators: Sore, Discomfort, Other (Comment) (States a pinching sensation) Pain Intervention(s): Monitored during session  Frequency  Min 2X/week        Progress Toward Goals  OT Goals(current goals can now be found in the care plan section)  Progress towards OT goals: Progressing toward goals  Acute Rehab OT Goals Patient Stated Goal: Hoping to go home soon OT Goal Formulation: With patient Time For Goal Achievement: 05/20/21 Potential to Achieve Goals: Good  Plan Discharge plan remains appropriate    Co-evaluation                 AM-PAC OT "6 Clicks" Daily Activity     Outcome Measure   Help from another person eating meals?: None Help from another person taking care of personal grooming?: None Help from another person toileting, which includes using toliet, bedpan, or urinal?: A  Little Help from another person bathing (including washing, rinsing, drying)?: A Little Help from another person to put on and taking off regular upper body clothing?: None Help from another person to put on and taking off regular lower body clothing?: A Little 6 Click Score: 21    End of Session Equipment Utilized During Treatment: Rolling walker (2 wheels);Gait belt  OT Visit Diagnosis: Unsteadiness on feet (R26.81);Pain   Activity Tolerance Patient tolerated treatment well   Patient Left in bed;with call bell/phone within reach;with family/visitor present   Nurse Communication          Time: 1355-1414 OT Time Calculation (min): 19 min  Charges: OT General Charges $OT Visit: 1 Visit OT Treatments $Self Care/Home Management : 8-22 mins  05/07/2021  RP, OTR/L  Acute Rehabilitation Services  Office:  [phone]   Metta Clines 05/07/2021, 2:23 PM

## 2021-05-07 NOTE — Progress Notes (Signed)
Trauma/Critical Care Follow Up Note  Subjective:    Overnight Issues:   Objective:  Vital signs for last 24 hours: Temp:  [97.7 F (36.5 C)-98.7 F (37.1 C)] 97.9 F (36.6 C) (01/28 0726) Pulse Rate:  [59-64] 63 (01/28 0726) Resp:  [12-19] 13 (01/28 0726) BP: (107-143)/(57-81) 118/57 (01/28 0726) SpO2:  [98 %-100 %] 100 % (01/28 0726)  Hemodynamic parameters for last 24 hours:    Intake/Output from previous day: 01/27 0701 - 01/28 0700 In: 370.3 [P.O.:120; I.V.:250.3] Out: 550 [Urine:550]  Intake/Output this shift: No intake/output data recorded.  Vent settings for last 24 hours:    Physical Exam:  Gen: comfortable, no distress Neuro: non-focal exam HEENT: PERRL Neck: supple CV: RRR Pulm: unlabored breathing Abd: soft, NT GU: clear yellow urine Extr: wwp, no edema, fullness and tenderness of R hip   Results for orders placed or performed during the hospital encounter of 05/03/21 (from the past 24 hour(s))  Glucose, capillary     Status: Abnormal   Collection Time: 05/06/21 11:29 AM  Result Value Ref Range   Glucose-Capillary 129 (H) 70 - 99 mg/dL  Glucose, capillary     Status: Abnormal   Collection Time: 05/06/21  4:43 PM  Result Value Ref Range   Glucose-Capillary 234 (H) 70 - 99 mg/dL  Glucose, capillary     Status: Abnormal   Collection Time: 05/06/21  7:38 PM  Result Value Ref Range   Glucose-Capillary 274 (H) 70 - 99 mg/dL  Glucose, capillary     Status: Abnormal   Collection Time: 05/06/21 11:39 PM  Result Value Ref Range   Glucose-Capillary 249 (H) 70 - 99 mg/dL  CBC     Status: Abnormal   Collection Time: 05/07/21  2:23 AM  Result Value Ref Range   WBC 11.1 (H) 4.0 - 10.5 K/uL   RBC 2.41 (L) 4.22 - 5.81 MIL/uL   Hemoglobin 7.3 (L) 13.0 - 17.0 g/dL   HCT 22.3 (L) 39.0 - 52.0 %   MCV 92.5 80.0 - 100.0 fL   MCH 30.3 26.0 - 34.0 pg   MCHC 32.7 30.0 - 36.0 g/dL   RDW 16.6 (H) 11.5 - 15.5 %   Platelets 93 (L) 150 - 400 K/uL   nRBC 0.0 0.0  - 0.2 %  Basic metabolic panel     Status: Abnormal   Collection Time: 05/07/21  2:23 AM  Result Value Ref Range   Sodium 137 135 - 145 mmol/L   Potassium 3.8 3.5 - 5.1 mmol/L   Chloride 108 98 - 111 mmol/L   CO2 25 22 - 32 mmol/L   Glucose, Bld 183 (H) 70 - 99 mg/dL   BUN 14 8 - 23 mg/dL   Creatinine, Ser 1.14 0.61 - 1.24 mg/dL   Calcium 7.9 (L) 8.9 - 10.3 mg/dL   GFR, Estimated >60 >60 mL/min   Anion gap 4 (L) 5 - 15  Glucose, capillary     Status: Abnormal   Collection Time: 05/07/21  4:55 AM  Result Value Ref Range   Glucose-Capillary 170 (H) 70 - 99 mg/dL  Glucose, capillary     Status: None   Collection Time: 05/07/21  7:58 AM  Result Value Ref Range   Glucose-Capillary 81 70 - 99 mg/dL   Comment 1 Notify RN    Comment 2 Document in Chart     Assessment & Plan: The plan of care was discussed with the bedside nurse for the day, who is in agreement  with this plan and no additional concerns were raised.   Present on Admission:  Traumatic retroperitoneal hematoma    LOS: 4 days   Additional comments:I reviewed the patient's new clinical lab test results.   and I reviewed the patients new imaging test results.    Fall from step ladder 4am 1/24  R 10th rib FX - pain control and pulmonary toilet, F/U CXR OK Complex R iliac FX with large hematoma - 1u pRBC yest, hgb hasnt quite stabilized but appropriate /response. BP and HR normal. Recheck in AM.  ABL anemia - above Thrombocytopenia - consumptive, 93k, monitor DM - SSI CKD 3a VTE - no Lovenox until Hb stable and PLTs over 100k FEN - carb mod, stop IV fluids Dispo - ICU  Jesusita Oka, MD Trauma & General Surgery Please use [email or web address] to contact on call provider  05/07/2021  *Care during the described time interval was provided by me. I have reviewed this patient's available data, including medical history, events of note, physical examination and test results as part of my evaluation.

## 2021-05-08 LAB — CBC
HCT: 21.7 % — ABNORMAL LOW (ref 39.0–52.0)
Hemoglobin: 7.1 g/dL — ABNORMAL LOW (ref 13.0–17.0)
MCH: 30.6 pg (ref 26.0–34.0)
MCHC: 32.7 g/dL (ref 30.0–36.0)
MCV: 93.5 fL (ref 80.0–100.0)
Platelets: 99 10*3/uL — ABNORMAL LOW (ref 150–400)
RBC: 2.32 MIL/uL — ABNORMAL LOW (ref 4.22–5.81)
RDW: 16.7 % — ABNORMAL HIGH (ref 11.5–15.5)
WBC: 11.4 10*3/uL — ABNORMAL HIGH (ref 4.0–10.5)
nRBC: 0 % (ref 0.0–0.2)

## 2021-05-08 LAB — MAGNESIUM: Magnesium: 1.6 mg/dL — ABNORMAL LOW (ref 1.7–2.4)

## 2021-05-08 LAB — BASIC METABOLIC PANEL
Anion gap: 7 (ref 5–15)
BUN: 17 mg/dL (ref 8–23)
CO2: 24 mmol/L (ref 22–32)
Calcium: 7.9 mg/dL — ABNORMAL LOW (ref 8.9–10.3)
Chloride: 106 mmol/L (ref 98–111)
Creatinine, Ser: 1.17 mg/dL (ref 0.61–1.24)
GFR, Estimated: >60 mL/min (ref >60)
Glucose, Bld: 193 mg/dL — ABNORMAL HIGH (ref 70–99)
Potassium: 3.6 mmol/L (ref 3.5–5.1)
Sodium: 137 mmol/L (ref 135–145)

## 2021-05-08 LAB — GLUCOSE, CAPILLARY
Glucose-Capillary: 190 mg/dL — ABNORMAL HIGH (ref 70–99)
Glucose-Capillary: 139 mg/dL — ABNORMAL HIGH (ref 70–99)
Glucose-Capillary: 248 mg/dL — ABNORMAL HIGH (ref 70–99)
Glucose-Capillary: 276 mg/dL — ABNORMAL HIGH (ref 70–99)
Glucose-Capillary: 238 mg/dL — ABNORMAL HIGH (ref 70–99)

## 2021-05-08 MED ORDER — MAGNESIUM SULFATE 2 GM/50ML IV SOLN
2.0000 g | Freq: Once | INTRAVENOUS | Status: AC
  Administered 2021-05-08: 2 g via INTRAVENOUS
  Filled 2021-05-08: qty 50

## 2021-05-08 NOTE — Progress Notes (Signed)
Mobility Specialist Progress Note   05/08/21 1100  Mobility  Activity Ambulated with assistance in hallway  Level of Assistance Minimal assist, patient does 75% or more  Assistive Device Front wheel walker  RLE Weight Bearing WBAT  Activity Response Tolerated well  $Mobility charge 1 Mobility   Received pt in bed w/ slight pain this morning but agreeable. MinA supine <> EOB d/t limited mobility and R hip pain. Min cues for step by step direction needed throughout session to keep pt on task. Gait presented stable during ambulation, inc in pain when performing lateral steps and weight shifting. Returned BTB w/ call bell by side and all needs met.   Holland Falling Mobility Specialist Phone Number [phone]

## 2021-05-08 NOTE — Progress Notes (Signed)
Trauma/Critical Care Follow Up Note  Subjective:    Overnight Issues: Doing better with mobility  Objective:  Vital signs for last 24 hours: Temp:  [97.8 F (36.6 C)-98.4 F (36.9 C)] 98 F (36.7 C) (01/29 0724) Pulse Rate:  [67-85] 72 (01/29 0724) Resp:  [14-19] 19 (01/29 0724) BP: (100-131)/(53-80) 100/53 (01/29 0724) SpO2:  [96 %-100 %] 100 % (01/29 0724)  Hemodynamic parameters for last 24 hours:    Intake/Output from previous day: No intake/output data recorded.  Intake/Output this shift: Total I/O In: -  Out: 400 [Urine:400]  Vent settings for last 24 hours:    Physical Exam:  Gen: comfortable, no distress Neuro: non-focal exam HEENT: PERRL Neck: supple CV: RRR Pulm: unlabored breathing Abd: soft, NT GU: clear yellow urine Extr: wwp, no edema, fullness and tenderness of R hip   Results for orders placed or performed during the hospital encounter of 05/03/21 (from the past 24 hour(s))  Glucose, capillary     Status: Abnormal   Collection Time: 05/07/21 11:40 AM  Result Value Ref Range   Glucose-Capillary 153 (H) 70 - 99 mg/dL   Comment 1 Notify RN    Comment 2 Document in Chart   Glucose, capillary     Status: Abnormal   Collection Time: 05/07/21  4:22 PM  Result Value Ref Range   Glucose-Capillary 294 (H) 70 - 99 mg/dL  Glucose, capillary     Status: Abnormal   Collection Time: 05/07/21  7:31 PM  Result Value Ref Range   Glucose-Capillary 204 (H) 70 - 99 mg/dL  Glucose, capillary     Status: Abnormal   Collection Time: 05/07/21 11:15 PM  Result Value Ref Range   Glucose-Capillary 198 (H) 70 - 99 mg/dL  Basic metabolic panel     Status: Abnormal   Collection Time: 05/08/21  3:13 AM  Result Value Ref Range   Sodium 137 135 - 145 mmol/L   Potassium 3.6 3.5 - 5.1 mmol/L   Chloride 106 98 - 111 mmol/L   CO2 24 22 - 32 mmol/L   Glucose, Bld 193 (H) 70 - 99 mg/dL   BUN 17 8 - 23 mg/dL   Creatinine, Ser 1.17 0.61 - 1.24 mg/dL   Calcium 7.9 (L)  8.9 - 10.3 mg/dL   GFR, Estimated >60 >60 mL/min   Anion gap 7 5 - 15  CBC     Status: Abnormal   Collection Time: 05/08/21  3:13 AM  Result Value Ref Range   WBC 11.4 (H) 4.0 - 10.5 K/uL   RBC 2.32 (L) 4.22 - 5.81 MIL/uL   Hemoglobin 7.1 (L) 13.0 - 17.0 g/dL   HCT 21.7 (L) 39.0 - 52.0 %   MCV 93.5 80.0 - 100.0 fL   MCH 30.6 26.0 - 34.0 pg   MCHC 32.7 30.0 - 36.0 g/dL   RDW 16.7 (H) 11.5 - 15.5 %   Platelets 99 (L) 150 - 400 K/uL   nRBC 0.0 0.0 - 0.2 %  Magnesium     Status: Abnormal   Collection Time: 05/08/21  3:13 AM  Result Value Ref Range   Magnesium 1.6 (L) 1.7 - 2.4 mg/dL  Glucose, capillary     Status: Abnormal   Collection Time: 05/08/21  3:56 AM  Result Value Ref Range   Glucose-Capillary 190 (H) 70 - 99 mg/dL  Glucose, capillary     Status: Abnormal   Collection Time: 05/08/21  7:34 AM  Result Value Ref Range   Glucose-Capillary  139 (H) 70 - 99 mg/dL   Comment 1 Notify RN    Comment 2 Document in Chart     Assessment & Plan: The plan of care was discussed with the bedside nurse for the day, who is in agreement with this plan and no additional concerns were raised.   Present on Admission:  Traumatic retroperitoneal hematoma    LOS: 5 days   Additional comments:I reviewed the patient's new clinical lab test results.   and I reviewed the patients new imaging test results.    Fall from step ladder 4am 1/24  R 10th rib FX - pain control and pulmonary toilet, F/U CXR OK Complex R iliac FX with large hematoma - 1u pRBC 1/27.hemoglobin stabilizing..  ABL anemia - above Thrombocytopenia - consumptive, improving, 99k, monitor DM - SSI CKD 3a VTE - no Lovenox until Hb stable and PLTs over 100k FEN - carb mod, stop IV fluids Dispo -progressive.  Start discharge  [facility] Please use [email or web address] to contact on call provider  05/08/2021  *Care during the described time interval was provided by me. I have reviewed this patient's  available data, including medical history, events of note, physical examination and test results as part of my evaluation.

## 2021-05-09 LAB — TYPE AND SCREEN
ABO/RH(D): O POS
Antibody Screen: NEGATIVE

## 2021-05-09 LAB — CBC
HCT: 21.4 % — ABNORMAL LOW (ref 39.0–52.0)
Hemoglobin: 7.1 g/dL — ABNORMAL LOW (ref 13.0–17.0)
MCH: 31.1 pg (ref 26.0–34.0)
MCHC: 33.2 g/dL (ref 30.0–36.0)
MCV: 93.9 fL (ref 80.0–100.0)
Platelets: 143 10*3/uL — ABNORMAL LOW (ref 150–400)
RBC: 2.28 MIL/uL — ABNORMAL LOW (ref 4.22–5.81)
RDW: 17 % — ABNORMAL HIGH (ref 11.5–15.5)
WBC: 10 10*3/uL (ref 4.0–10.5)
nRBC: 0.2 % (ref 0.0–0.2)

## 2021-05-09 LAB — GLUCOSE, CAPILLARY
Glucose-Capillary: 148 mg/dL — ABNORMAL HIGH (ref 70–99)
Glucose-Capillary: 100 mg/dL — ABNORMAL HIGH (ref 70–99)
Glucose-Capillary: 121 mg/dL — ABNORMAL HIGH (ref 70–99)
Glucose-Capillary: 173 mg/dL — ABNORMAL HIGH (ref 70–99)

## 2021-05-09 MED ORDER — OXYCODONE HCL 5 MG PO TABS: 5.0000 mg | ORAL_TABLET | Freq: Four times a day (QID) | ORAL | 0 refills | Status: DC | PRN

## 2021-05-09 MED ORDER — METHOCARBAMOL 500 MG PO TABS: 500.0000 mg | ORAL_TABLET | Freq: Four times a day (QID) | ORAL | 0 refills | Status: DC | PRN

## 2021-05-09 MED ORDER — ACETAMINOPHEN 325 MG PO TABS: 650.0000 mg | ORAL_TABLET | ORAL | Status: DC | PRN

## 2021-05-09 NOTE — Progress Notes (Signed)
Pt with discharge orders, discharge paperwork reviewed with patient and all questions answered. IV's removed. Pt given all equipment and taken down via wheelchair with all belongings.

## 2021-05-09 NOTE — Discharge Summary (Signed)
Patient ID: Jacob Barnes [id number] 1940-05-23 81 y.o.  Admit date: 05/03/2021 Discharge date: 05/09/2021  Admitting Diagnosis: Fall R iliac fx, hematoma DM CKD  Discharge Diagnosis Patient Active Problem List   Diagnosis Date Noted   Traumatic retroperitoneal hematoma 05/03/2021   Dementia ([place]) 01/24/2021   Frequent stools 06/18/2020   At high risk for falls 04/09/2020   Uncontrolled type 2 diabetes mellitus with hyperglycemia ([place]) 01/28/2020   Osteopenia 01/28/2020   Compression fracture of L1 lumbar vertebra ([place]) 01/09/2020   After-cataract with vision obscured 02/10/2019   Diabetic macular edema ([place]) 02/10/2019   Dry eye syndrome of bilateral lacrimal glands 02/10/2019   Lens replaced by other means 02/10/2019   Open-angle glaucoma 02/10/2019   Presence of intraocular lens 02/10/2019   Unspecified visual loss 02/10/2019   Type 2 diabetes mellitus with proliferative retinopathy of both eyes and macular edema ([place]) 02/10/2019   Type 2 diabetes mellitus with vascular disease ([place]) 10/20/2018   Bilateral pseudophakia 02/08/2016   Proliferative retinopathy with retinal edema due to type 2 diabetes mellitus ([place]) 02/08/2016   Primary open angle glaucoma of both eyes 02/08/2016   CKD (chronic kidney disease) stage 3, GFR 30-59 ml/min (HCC) 12/15/2013   Mild protein-calorie malnutrition ([place]) 12/11/2013   Mixed hyperlipidemia 07/16/2013   GERD (gastroesophageal reflux disease) 06/27/2012   Background diabetic retinopathy ([place]) 03/28/2012   Retinal edema 03/28/2012   Tear film insufficiency 08/07/2011   Borderline glaucoma, open angle with borderline findings 03/22/2011   Essential hypertension, benign 01/02/2009   Type 2 or unspecified type diabetes mellitus, uncontrolled 12/21/2008  Fall from step ladder 4am 1/24 R 10th rib FX  Complex R iliac FX with large hematoma  ABL anemia  Thrombocytopenia  DM CKD 3a  Consultants Dr. Lucia Gaskins, ortho  Reason for  Admission: [name] who sustained a fall around 4:00 in the morning today about 3 feet from a stepladder, landing on a cement floor.  He has been able to get up and walk around, but with significant pain and requiring a lot of assistance.  Due to ongoing pain, he did present to the [facility] emergency room around 2:00 in the afternoon for evaluation.  Describes pain along the right side of his torso, but states that it is better now than it had been.  Denies hitting his head or loss of consciousness.  No anticoagulants.  Work-up included plain films of the ribs and right hip well as a CT of the chest abdomen pelvis, with findings below most significantly iliac fracture with retroperitoneal hematoma and possible extravasation.  He has been transferred to [facility] for evaluation by the trauma service.  Procedures none  Hospital Course:  The patient was admitted for observation for his rib fractures as well as his pelvic fx.  He was seen by ortho who recommended WBAT with non-op management.  He worked with therapies who recommended HH PT,OT.  He did have some ABL Anemia but this stabilized out.  On HD 6, the patient was otherwise stable for DC home.  All other medical problems remained stable during his stay as well  Physical Exam: Gen: comfortable, no distress Neuro: non-focal exam HEENT: PERRL Neck: supple CV: RRR Pulm: unlabored breathing Abd: soft, NT Extr: wwp, no edema, fullness and tenderness of R hip  Allergies as of 05/09/2021   No Known Allergies      Medication List     TAKE these medications    acetaminophen 325  MG tablet Commonly known as: TYLENOL Take 2 tablets (650 mg total) by mouth every 4 (four) hours as needed.   benazepril 20 MG tablet Commonly known as: LOTENSIN Take 1 tablet (20 mg total) by mouth daily.   blood glucose meter kit and supplies Dispense based on patient and insurance preference. Use up to three times daily  as directed. (dx E11.65).   CALCIUM PO Take 1 tablet by mouth daily.   donepezil 10 MG tablet Commonly known as: ARICEPT Take 1 tablet (10 mg total) by mouth at bedtime. What changed: when to take this   dorzolamide-timolol 22.3-6.8 MG/ML ophthalmic solution Commonly known as: COSOPT Place 1 drop into both eyes 2 (two) times daily.   FreeStyle Libre 2 Reader [place] 1 each by Does not apply route in the morning, at noon, in the evening, and at bedtime. Use to test BG qid   FreeStyle Libre 2 Sensor Misc 1 each by Does not apply route every 14 (fourteen) days.   Iron (Ferrous Sulfate) 325 (65 Fe) MG Tabs Take one tablet by mouth once daily What changed:  how much to take how to take this when to take this additional instructions   latanoprost 0.005 % ophthalmic solution Commonly known as: XALATAN Place 1 drop into both eyes at bedtime.   methocarbamol 500 MG tablet Commonly known as: ROBAXIN Take 1 tablet (500 mg total) by mouth every 6 (six) hours as needed for muscle spasms.   OneTouch Verio test strip Generic drug: glucose blood USE AS DIRECTED UP TO 4 TIMES DAILY   oxyCODONE 5 MG immediate release tablet Commonly known as: Oxy IR/ROXICODONE Take 1 tablet (5 mg total) by mouth every 6 (six) hours as needed for severe pain.   pantoprazole 40 MG tablet Commonly known as: PROTONIX Take 1 tablet (40 mg total) by mouth daily.   Pen Needles 31G X 6 MM Misc 1 each by Does not apply route at bedtime.   PROBIOTIC DAILY PO Take 1 tablet by mouth daily.   rosuvastatin 5 MG tablet Commonly known as: CRESTOR Take one tablet by mouth every Monday , Wednesday and Friday What changed:  how much to take how to take this when to take this additional instructions   Tradjenta 5 MG Tabs tablet Generic drug: linagliptin TAKE 1 TABLET BY MOUTH EVERY DAY What changed: how much to take   [place] FlexTouch 100 UNIT/ML FlexTouch Pen Generic drug: insulin degludec Inject 10  Units into the skin at bedtime.   VITAMIN D3 PO Take 1 tablet by mouth daily.               Durable Medical Equipment  (From admission, onward)           Start     Ordered   05/06/21 1613  For home use only DME Walker rolling  Once       Question Answer Comment  Walker: With 5 Inch Wheels   Patient needs a walker to treat with the following condition Mesenteric hematoma      05/06/21 [address] Follow up.   Why: [facility]; agency will call you to arrange appointments. Contact information: [place] [address] [phone]         Fayrene Helper, MD Follow up in 2 week(s).   Specialty:  Family Medicine Why: As needed for rib fracture pain Contact information: [address] [place] [address] [phone]         Erle Crocker, MD Follow up in 2 day(s).   Specialty: Orthopedic Surgery Why: Follow up for your iliac fracture (pelvic fracture) Contact information: [address] [place] [address] [phone]                Less than 30 minutes spent doing discharge summary  Signed: Saverio Danker, Dr[facility] 05/09/2021, 10:21 AM Please see Amion for pager number during day hours 7:00am-4:30pm, 7-11:30am on Weekends

## 2021-05-09 NOTE — TOC Transition Note (Signed)
Transition of Care [facility]) - CM/SW Discharge Note   Patient Details  Name: Jacob Barnes MRN: [id number] Date of Birth: 1940-05-01  Transition of [facility] CM/SW Contact:  Ella Bodo, RN Phone Number: 05/09/2021, 12:42 PM   Clinical Narrative:    Pt medically stable for discharge home today with family and [facility] services as previously arranged.  Faxed discharge summary to [facility] at [phone].    Final next level of care: [place] Barriers to Discharge: Barriers Resolved   Patient Goals and CMS Choice Patient states their goals for this hospitalization and ongoing recovery are:: to go home CMS Medicare.gov Compare Post Acute Care list provided to:: Patient Represenative (must comment) (son) Choice offered to / list presented to : Adult Children                        Discharge Plan and Services   Discharge Planning Services: CM Consult Post Acute Care Choice: Home Health          DME Arranged: Walker rolling DME Agency: [employer] Date DME Agency Contacted: 05/06/21 Time DME Agency Contacted: [phone] Representative spoke with at DME Agency: Freda Munro [place]: PT, OT [facility] Agency: [place] Date [place]: 05/06/21 Time [place]: [place] Representative spoke with at [place]: [place] ([place]) Interventions     Readmission Risk Interventions No flowsheet data found.  Reinaldo Raddle, RN, BSN  Trauma/Neuro ICU Case Manager [phone]

## 2021-05-09 NOTE — Progress Notes (Signed)
Physical Therapy Treatment Patient Details Name: Jacob Barnes MRN: [id number] DOB: 01-May-1940 Today's Date: 05/09/2021   History of Present Illness 81 yo male presents to ED On 1/24 after fall off step ladder, sustaining retroperitoneal hematoma and possible extravasation, R 10th rib fracture, R iliac fracture. PMH includes dementia, DM with retinopathy, HTN, carpal tunnel, HTN, HLD.    PT Comments    PT saw pt for second session per family request for stair training. Pt navigated steps proficiently with cues for form/safety, as well as sequencing assist. Pt's family at bedside at end of session, PT reviewed safe mobility practices with family and encouraged pt to continue to use RW while mobilizing in home. Pt to d/c today.      Recommendations for follow up therapy are one component of a multi-disciplinary discharge planning process, led by the attending physician.  Recommendations may be updated based on patient status, additional functional criteria and insurance authorization.  Follow Up Recommendations  Home health PT     Assistance Recommended at Discharge Intermittent Supervision/Assistance  Patient can return home with the following A little help with walking and/or transfers;Assist for transportation;Help with stairs or ramp for entrance   Equipment Recommendations  Rolling walker (2 wheels)    Recommendations for Other Services       Precautions / Restrictions Precautions Precautions: Fall Restrictions Weight Bearing Restrictions: No RLE Weight Bearing: Weight bearing as tolerated     Mobility  Bed Mobility Overal bed mobility: Needs Assistance Bed Mobility: Rolling, Sidelying to Sit Rolling: Supervision Sidelying to sit: Supervision       General bed mobility comments: for safety, increased time with min cuing and use of bedrail    Transfers Overall transfer level: Needs assistance Equipment used: Rolling walker (2 wheels) Transfers: Sit to/from  Stand Sit to Stand: Supervision           General transfer comment: cues for correct hand placement when rising, no physical assist    Ambulation/Gait Ambulation/Gait assistance: Supervision Gait Distance (Feet): 130 Feet Assistive device: Rolling walker (2 wheels) Gait Pattern/deviations: Step-through pattern, Decreased stride length Gait velocity: decr     General Gait Details: supervision for safety, cues for placement inside RW and hallway navigation   Stairs Stairs: Yes Stairs assistance: Min guard Stair Management: One rail Left, Step to pattern, Forwards Number of Stairs: 10 General stair comments: close guard for safety, cues for sequencing (up with LLE and down with RLE leading), bilat hand placement on rails, step-to gait, and caregiver placement for support. stair navigation explained to pt's family at bedside (wife, daughter, grandson)   Engineer, building services [place] (Stroke Patients Only)       Balance Overall balance assessment: History of Falls, Needs assistance Sitting-balance support: No upper extremity supported, Feet supported Sitting balance-Leahy Scale: Good     Standing balance support: Bilateral upper extremity supported, During functional activity Standing balance-Leahy Scale: Poor Standing balance comment: reliant on RW for standing balance, even statically                            Cognition Arousal/Alertness: Awake/alert Behavior During Therapy: WFL for tasks assessed/performed Overall Cognitive Status: History of cognitive impairments - at baseline                                 General Comments: history of dementia, requires cuing during  mobility        Exercises      General Comments        Pertinent Vitals/Pain Pain Assessment Pain Assessment: Faces Faces Pain Scale: Hurts a little bit Pain Location: R side Pain Descriptors / Indicators: Sore Pain Intervention(s): Limited activity  within patient's tolerance, Monitored during session, Repositioned    Home Living                          Prior Function            PT Goals (current goals can now be found in the care plan section) Acute Rehab PT Goals Patient Stated Goal: home PT Goal Formulation: With patient Time For Goal Achievement: 05/19/21 Potential to Achieve Goals: Good Progress towards PT goals: Progressing toward goals    Frequency    Min 4X/week      PT Plan Current plan remains appropriate    Co-evaluation              AM-PAC PT "6 Clicks" Mobility   Outcome Measure  Help needed turning from your back to your side while in a flat bed without using bedrails?: A Little Help needed moving from lying on your back to sitting on the side of a flat bed without using bedrails?: A Little Help needed moving to and from a bed to a chair (including a wheelchair)?: A Little Help needed standing up from a chair using your arms (e.g., wheelchair or bedside chair)?: A Little Help needed to walk in hospital room?: A Little Help needed climbing 3-5 steps with a railing? : A Little 6 Click Score: 18    End of Session Equipment Utilized During Treatment: Gait belt Activity Tolerance: Patient tolerated treatment well Patient left: with call bell/phone within reach;in bed;with family/visitor present Nurse Communication: Mobility status PT Visit Diagnosis: Other abnormalities of gait and mobility (R26.89);Muscle weakness (generalized) (M62.81)     Time: 5701-7793 PT Time Calculation (min) (ACUTE ONLY): 19 min  Charges:  $Gait Training: 8-22 mins                     Stacie Glaze, PT DPT Acute Rehabilitation Services Pager [phone]  Office [phone]    [place] 05/09/2021, 12:26 PM

## 2021-05-09 NOTE — Care Management Important Message (Signed)
Important Message  Patient Details  Name: Jacob Barnes MRN: [id number] Date of Birth: 08/01/1940   Medicare Important Message Given:  Yes     Hannah Beat 05/09/2021, 11:55 AM

## 2021-05-09 NOTE — TOC Progression Note (Signed)
Transition of Care [facility]) - Progression Note    Patient Details  Name: MADYX DELFIN MRN: [id number] Date of Birth: [birth year]  Transition of Care [facility]) CM/SW [place], RN Phone Number:[phone]  05/09/2021, 11:59 AM  Clinical Narrative:    PT made CM aware that patient needs rolling walker to be delivered. CM called [employer] to confirm delivery but there is no answer. Message left for [facility] with [employer]  requesting rolling walker delivery.    Expected Discharge Plan: [place] Barriers to Discharge: Continued Medical Work up  Expected Discharge Plan and Services Expected Discharge Plan: [place]   Discharge Planning Services: CM Consult Post Acute Care Choice: Home Health   Expected Discharge Date: 05/09/21               DME Arranged: Gilford Rile rolling DME Agency: [employer] Date DME Agency Contacted: 05/06/21 Time DME Agency Contacted: [phone] Representative spoke with at DME Agency: Freda Munro [place]: PT, OT [place] Agency: [place] Date [place]: 05/06/21 Time [place]: [place] Representative spoke with at [place]: [place] Interventions    Readmission Risk Interventions No flowsheet data found.

## 2021-05-09 NOTE — Progress Notes (Signed)
Physical Therapy Treatment Patient Details Name: Jacob Barnes MRN: [id number] DOB: 10/05/1940 Today's Date: 05/09/2021   History of Present Illness 81 yo male presents to ED On 1/24 after fall off step ladder, sustaining retroperitoneal hematoma and possible extravasation, R 10th rib fracture, R iliac fracture. PMH includes dementia, DM with retinopathy, HTN, carpal tunnel, HTN, HLD.    PT Comments    Pt progressing very well with mobility, ambulatory around unit with good balance with use of RW. Pt reports improving R flank and hip pain, and demonstrates improving R foot clearance during gait. Pt appropriate to d/c home from a PT standpoint, with supervision-level support of wife and children.    Recommendations for follow up therapy are one component of a multi-disciplinary discharge planning process, led by the attending physician.  Recommendations may be updated based on patient status, additional functional criteria and insurance authorization.  Follow Up Recommendations  Home health PT     Assistance Recommended at Discharge Intermittent Supervision/Assistance  Patient can return home with the following A little help with walking and/or transfers;Assist for transportation;Help with stairs or ramp for entrance   Equipment Recommendations  Rolling walker (2 wheels)    Recommendations for Other Services       Precautions / Restrictions Precautions Precautions: Fall Restrictions Weight Bearing Restrictions: No RLE Weight Bearing: Weight bearing as tolerated     Mobility  Bed Mobility Overal bed mobility: Needs Assistance Bed Mobility: Rolling, Sidelying to Sit Rolling: Supervision Sidelying to sit: Supervision       General bed mobility comments: for safety, increased time with min cuing and use of bedrail    Transfers Overall transfer level: Needs assistance Equipment used: Rolling walker (2 wheels) Transfers: Sit to/from Stand Sit to Stand: Supervision            General transfer comment: cues for correct hand placement when rising    Ambulation/Gait Ambulation/Gait assistance: Supervision Gait Distance (Feet): 400 Feet Assistive device: Rolling walker (2 wheels) Gait Pattern/deviations: Step-through pattern, Decreased stride length Gait velocity: decr     General Gait Details: cues for upright posture, placement in RW. Pt with improving R foot clearance   Stairs             Wheelchair Mobility    Modified Rankin (Stroke Patients Only)       Balance Overall balance assessment: History of Falls, Needs assistance Sitting-balance support: No upper extremity supported, Feet supported Sitting balance-Leahy Scale: Good     Standing balance support: Bilateral upper extremity supported, During functional activity Standing balance-Leahy Scale: Fair Standing balance comment: can stand statically without UE support, requires RW use dynamically                            Cognition Arousal/Alertness: Awake/alert Behavior During Therapy: WFL for tasks assessed/performed Overall Cognitive Status: Within Functional Limits for tasks assessed                                          Exercises      General Comments        Pertinent Vitals/Pain Pain Assessment Pain Assessment: Faces Faces Pain Scale: Hurts little more Pain Location: R side Pain Descriptors / Indicators: Sore Pain Intervention(s): Limited activity within patient's tolerance, Monitored during session, Repositioned    Home Living  Prior Function            PT Goals (current goals can now be found in the care plan section) Acute Rehab PT Goals Patient Stated Goal: home PT Goal Formulation: With patient Time For Goal Achievement: 05/19/21 Potential to Achieve Goals: Good Progress towards PT goals: Progressing toward goals    Frequency    Min 4X/week      PT Plan Current plan  remains appropriate    Co-evaluation              AM-PAC PT "6 Clicks" Mobility   Outcome Measure  Help needed turning from your back to your side while in a flat bed without using bedrails?: A Little Help needed moving from lying on your back to sitting on the side of a flat bed without using bedrails?: A Little Help needed moving to and from a bed to a chair (including a wheelchair)?: A Little Help needed standing up from a chair using your arms (e.g., wheelchair or bedside chair)?: A Little Help needed to walk in hospital room?: A Little Help needed climbing 3-5 steps with a railing? : A Little 6 Click Score: 18    End of Session   Activity Tolerance: Patient tolerated treatment well Patient left: in chair;with call bell/phone within reach;with chair alarm set Nurse Communication: Mobility status PT Visit Diagnosis: Other abnormalities of gait and mobility (R26.89);Muscle weakness (generalized) (M62.81)     Time: 5830-9407 PT Time Calculation (min) (ACUTE ONLY): 22 min  Charges:  $Gait Training: 8-22 mins                    Stacie Glaze, PT DPT Acute Rehabilitation Services Pager [phone]  Office [phone]    [place] 05/09/2021, 10:12 AM

## 2021-05-10 ENCOUNTER — Ambulatory Visit: Payer: Medicare HMO | Admitting: Podiatry

## 2021-05-11 ENCOUNTER — Telehealth: Payer: Self-pay

## 2021-05-11 NOTE — Telephone Encounter (Signed)
Transition Care Management Follow-up Telephone Call Date of discharge and from where: [place] 05/09/21 How have you been since you were released from the hospital? Sore  Any questions or concerns? No  Items Reviewed: Did the pt receive and understand the discharge instructions provided? Yes  Medications obtained and verified? Yes  Other?    Any new allergies since your discharge? No  Dietary orders reviewed? No Do you have support at home? Yes   Home Care and Equipment/Supplies: Were home health services ordered? yes If so, what is the name of the agency? [facility]  Has the agency set up a time to come to the patient's home? yes Were any new equipment or medical supplies ordered?  Yes: Gilford Rile What is the name of the medical supply agency? [facility] Were you able to get the supplies/equipment? yes Do you have any questions related to the use of the equipment or supplies? No  Functional Questionnaire: (I = Independent and D = Dependent) ADLs: I  Bathing/Dressing- D  Meal Prep- D  Eating- I  Maintaining continence- I  Transferring/Ambulation- D  Managing Meds- D  Follow up appointments reviewed:  PCP Hospital f/u appt confirmed? Yes  Scheduled to see [place] on 05/20/21 @ 3:00pm. [facility] f/u appt confirmed? no Are transportation arrangements needed? No  If their condition worsens, is the pt aware to call PCP or go to the Emergency Dept.? Yes Was the patient provided with contact information for the PCP's office or ED? Yes Was to pt encouraged to call back with questions or concerns? Yes

## 2021-05-20 ENCOUNTER — Ambulatory Visit (INDEPENDENT_AMBULATORY_CARE_PROVIDER_SITE_OTHER): Payer: Medicare HMO | Admitting: Nurse Practitioner

## 2021-05-20 ENCOUNTER — Encounter: Payer: Self-pay | Admitting: Nurse Practitioner

## 2021-05-20 ENCOUNTER — Other Ambulatory Visit: Payer: Self-pay

## 2021-05-20 VITALS — BP 124/62 | HR 80 | Wt 118.0 lb

## 2021-05-20 DIAGNOSIS — Z09 Encounter for follow-up examination after completed treatment for conditions other than malignant neoplasm: Secondary | ICD-10-CM | POA: Diagnosis not present

## 2021-05-20 DIAGNOSIS — I10 Essential (primary) hypertension: Secondary | ICD-10-CM | POA: Diagnosis not present

## 2021-05-20 DIAGNOSIS — D649 Anemia, unspecified: Secondary | ICD-10-CM | POA: Diagnosis not present

## 2021-05-20 NOTE — Patient Instructions (Addendum)
Follow up with [facility] physical [facility] occupational therapies; agency will call you to arrange appointments. [place] [phone]  Thanks for choosing [facility], we consider it a privelige to serve you.

## 2021-05-20 NOTE — Progress Notes (Signed)
° °  Jacob Barnes     MRN: [id number]      DOB: 09/20/1940   HPI [name] is here for follow up for hospital admission.  Pt was in the hospital from 05/03/21 to 05/09/21 for fall, Right rib fractures , R iliac fracture, hematoma. He was fixing his water line in the basement when he fell. He was told that the fracture will heal itself. He is able to walk with a walker.  Pt stated that he has a follow up visit with otho on Monday next week, they have not heard from home health.    ROS Denies recent fever or chills. Denies sinus pressure, nasal congestion, ear pain or sore throat. Denies chest congestion, productive cough or wheezing. Denies chest pains, palpitations and leg swelling Denies abdominal pain, nausea, vomiting,diarrhea or constipation.   Denies dysuria, frequency, hesitancy or incontinence. Has right rib cage pain , right hip pain and limitation in mobility. Denies depression, anxiety or insomnia.    PE  BP 124/62 (BP Location: Left Arm, Patient Position: Sitting, Cuff Size: Normal)    Pulse 80    Wt 118 lb 0.6 oz (53.5 kg)    SpO2 100%    BMI 19.64 kg/m   Patient alert and oriented and in no cardiopulmonary distress.  HEENT: No facial asymmetry, EOMI,     Neck supple .  Chest: Clear to auscultation bilaterally.  CVS: S1, S2 no murmurs, no S3.Regular rate.  ABD: Soft non tender.   Ext: No edema  MS: limited ROM spine, hips and knees, Has tenderness on palpation of right rib cage area and right hip, right leg feels warm to touch , able to walk using a walker.   Skin: Intact, no ulcerations or rash noted.  Psych: Good eye contact, normal affect. Memory intact not anxious or depressed appearing.   Assessment & Plan

## 2021-05-20 NOTE — Assessment & Plan Note (Signed)
DASH diet and commitment to daily physical activity for a minimum of 30 minutes discussed and encouraged, as a part of hypertension management. The importance of attaining a healthy weight is also discussed.  BP/Weight 05/20/2021 05/09/2021 05/04/2021 03/29/2021 01/20/2021 01/20/2021 47/07/2593  Systolic BP 638 756 - 433 295 188 416  Diastolic BP 62 61 - 82 64 69 77  Wt. (Lbs) 118.04 - 111.99 112 121.4 120.12 122.8  BMI 19.64 - 18.64 18.64 20.2 19.99 20.43

## 2021-05-20 NOTE — Assessment & Plan Note (Addendum)
Pt was in the hospital from 05/03/21 to 05/09/21 for fall, Right rib fractures , R iliac fracture, hematoma. He was fixing his water line in the basement when he fell. He was told that the fracture will heal itself. He is able to walk with a walker.  Pt stated that he has a follow up visit with otho on Monday next week, they have not heard from home health.  He has not been  using pain medications, pt told that he can use tylenol, oxycodone as needed for pain. He stated that his pain is not bad unless when he is walking.   He was seen by ortho who recommended WBAT with non-op management.

## 2021-05-20 NOTE — Assessment & Plan Note (Signed)
CBC  Today, has blood transfusion while in the hospital .  Lab Results  Component Value Date   WBC 10.0 05/09/2021   HGB 7.1 (L) 05/09/2021   HCT 21.4 (L) 05/09/2021   MCV 93.9 05/09/2021   PLT 143 (L) 05/09/2021

## 2021-05-21 LAB — CBC
Hematocrit: 29.6 % — ABNORMAL LOW (ref 37.5–51.0)
Hemoglobin: 9.7 g/dL — ABNORMAL LOW (ref 13.0–17.7)
MCH: 30.9 pg (ref 26.6–33.0)
MCHC: 32.8 g/dL (ref 31.5–35.7)
MCV: 94 fL (ref 79–97)
Platelets: 237 10*3/uL (ref 150–450)
RBC: 3.14 x10E6/uL — ABNORMAL LOW (ref 4.14–5.80)
RDW: 13.6 % (ref 11.6–15.4)
WBC: 10.5 10*3/uL (ref 3.4–10.8)

## 2021-05-21 NOTE — Progress Notes (Signed)
Please review results with patient. Hemoglobin has improved more. We will recheck labs at next visit.  thanks

## 2021-05-24 ENCOUNTER — Ambulatory Visit: Payer: Medicare HMO | Admitting: Family Medicine

## 2021-05-24 ENCOUNTER — Ambulatory Visit: Payer: Medicare HMO | Admitting: Nurse Practitioner

## 2021-05-26 ENCOUNTER — Ambulatory Visit: Payer: Medicare HMO | Admitting: Nurse Practitioner

## 2021-05-27 ENCOUNTER — Other Ambulatory Visit: Payer: Self-pay

## 2021-05-27 ENCOUNTER — Ambulatory Visit: Payer: Medicare HMO | Admitting: Podiatry

## 2021-05-27 DIAGNOSIS — S90821A Blister (nonthermal), right foot, initial encounter: Secondary | ICD-10-CM

## 2021-05-27 DIAGNOSIS — E1165 Type 2 diabetes mellitus with hyperglycemia: Secondary | ICD-10-CM

## 2021-05-31 NOTE — Progress Notes (Signed)
Subjective:   Patient ID: Jacob Barnes, male   DOB: 81 y.o.   MRN: [id number]   HPI 81 year old male presents Today for concerns of a wound on the bottom of his right heel.  He states that he started about a week ago that he noticed it or longer he is not sure.  Not see any drainage or pus.  He said no recent treatment.  He recently just had negative discharge from the hospital but did not recall it being present while he was in the hospital.  No other concerns.  Last A1c was 8 as well as morning glucose this morning was 103.   Review of Systems  All other systems reviewed and are negative.  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   Anemia    Arthritis    hands   Carpal tunnel syndrome 08/05/2009   Qualifier: Diagnosis of  By: Aline Brochure MD, [name]     Chronic kidney disease    Diabetes mellitus    says since 1979 type 2   Diabetes mellitus without complication ([place])    Phreesia 04/03/2020   ERECTILE DYSFUNCTION, ORGANIC 01/20/2009   Qualifier: Diagnosis of  By: Moshe Cipro MD, Margaret     GERD (gastroesophageal reflux disease)    Glaucoma    both eyes   History of kidney stones    Hyperlipemia    Hypertension    Iron deficiency anemia 01/10/2014   Other pancytopenia ([place]) 01/10/2014   New in 01/2014, undergoing pathology review    Rash    front abdomen no drainage    Past Surgical History:  Procedure Laterality Date   AMPUTATION Right 09/01/2016   Procedure: AMPUTATION RIGHT MIDDLE FINGER TIP;  Surgeon: Mcarthur Rossetti, MD;  Location: [facility];  Service: Orthopedics;  Laterality: Right;   AMPUTATION Right 10/10/2016   Procedure: REPEAT IRRIGATION AND DEBRIDEMENT RIGHT MIDDLE FINGER WITH AMPUTATION THROUGH PROXIMAL PHALANX;  Surgeon: Mcarthur Rossetti, MD;  Location: [place];  Service: Orthopedics;  Laterality: Right;   APPENDECTOMY     COLONOSCOPY  09/06/2011    Procedure: COLONOSCOPY;  Surgeon: Rogene Houston, MD;  Location: [facility];  Service: Endoscopy;  Laterality: N/A;  830   CYSTOSCOPY/RETROGRADE/URETEROSCOPY/STONE EXTRACTION WITH BASKET     ESOPHAGOGASTRODUODENOSCOPY N/A 01/15/2014   Procedure: ESOPHAGOGASTRODUODENOSCOPY (EGD);  Surgeon: Rogene Houston, MD;  Location: [facility];  Service: Endoscopy;  Laterality: N/A;  200   ESOPHAGOGASTRODUODENOSCOPY N/A 04/22/2014   Procedure: ESOPHAGOGASTRODUODENOSCOPY (EGD);  Surgeon: Rogene Houston, MD;  Location: [facility];  Service: Endoscopy;  Laterality: N/A;  240   EYE SURGERY Bilateral yrs ago   ioc for cataract   MALONEY DILATION N/A 01/15/2014   Procedure: MALONEY DILATION;  Surgeon: Rogene Houston, MD;  [facility]: [facility];  Service: Endoscopy;  Laterality: N/A;   MALONEY DILATION  04/22/2014   Procedure: MALONEY DILATION;  Surgeon: Rogene Houston, MD;  Location: [facility];  Service: Endoscopy;;   surgery for left eye bleedf  2017     Current Outpatient Medications:    acetaminophen (TYLENOL) 325 MG tablet, Take 2 tablets (650 mg total) by mouth every 4 (four) hours as needed. (Patient not taking: Reported on 05/20/2021), Disp: , Rfl:    benazepril (LOTENSIN) 20 MG tablet, Take 1 tablet (20 mg total) by mouth daily., Disp: 90 tablet,  Rfl: 3   blood glucose meter kit and supplies, Dispense based on patient and insurance preference. Use up to three times daily as directed. (dx E11.65)., Disp: 1 each, Rfl: 0   CALCIUM PO, Take 1 tablet by mouth daily., Disp: , Rfl:    Cholecalciferol (VITAMIN D3 PO), Take 1 tablet by mouth daily., Disp: , Rfl:    Continuous Blood Gluc Receiver (FREESTYLE LIBRE 2 READER) DEVI, 1 each by Does not apply route in the morning, at noon, in the evening, and at bedtime. Use to test BG qid, Disp: 1 each, Rfl: 0   Continuous Blood Gluc Sensor (FREESTYLE LIBRE 2 SENSOR) MISC, 1 each by Does not apply route every 14 (fourteen) days., Disp: 2 each, Rfl: 5    donepezil (ARICEPT) 10 MG tablet, Take 1 tablet (10 mg total) by mouth at bedtime. (Patient taking differently: Take 10 mg by mouth daily.), Disp: 30 tablet, Rfl: 3   dorzolamide-timolol (COSOPT) 22.3-6.8 MG/ML ophthalmic solution, Place 1 drop into both eyes 2 (two) times daily., Disp: , Rfl:    insulin degludec (TRESIBA FLEXTOUCH) 100 UNIT/ML FlexTouch Pen, Inject 10 Units into the skin at bedtime., Disp: 9 mL, Rfl: 3   Insulin Pen Needle (PEN NEEDLES) 31G X 6 MM MISC, 1 each by Does not apply route at bedtime., Disp: 100 each, Rfl: 2   Iron, Ferrous Sulfate, 325 (65 Fe) MG TABS, Take one tablet by mouth once daily (Patient taking differently: Take 325 mg by mouth daily.), Disp: 90 tablet, Rfl: 3   latanoprost (XALATAN) 0.005 % ophthalmic solution, Place 1 drop into both eyes at bedtime., Disp: , Rfl:    methocarbamol (ROBAXIN) 500 MG tablet, Take 1 tablet (500 mg total) by mouth every 6 (six) hours as needed for muscle spasms., Disp: 30 tablet, Rfl: 0   ONETOUCH VERIO test strip, USE AS DIRECTED UP TO 4 TIMES DAILY, Disp: 100 strip, Rfl: 2   oxyCODONE (OXY IR/ROXICODONE) 5 MG immediate release tablet, Take 1 tablet (5 mg total) by mouth every 6 (six) hours as needed for severe pain. (Patient not taking: Reported on 05/20/2021), Disp: 20 tablet, Rfl: 0   pantoprazole (PROTONIX) 40 MG tablet, Take 1 tablet (40 mg total) by mouth daily., Disp: 30 tablet, Rfl: 3   Probiotic Product (PROBIOTIC DAILY PO), Take 1 tablet by mouth daily., Disp: , Rfl:    rosuvastatin (CRESTOR) 5 MG tablet, Take one tablet by mouth every Monday , Wednesday and Friday (Patient taking differently: Take 5 mg by mouth every Monday, Wednesday, and Friday.), Disp: 36 tablet, Rfl: 3   TRADJENTA 5 MG TABS tablet, TAKE 1 TABLET BY MOUTH EVERY DAY (Patient taking differently: Take 5 mg by mouth daily.), Disp: 90 tablet, Rfl: 0  No Known Allergies        Objective:  Physical Exam  General: AAO x3, NAD  Dermatological: Large  hemorrhagic blister present to the posterior aspect the right heel.  There is no fluid or fluctuation or crepitation present.  There is no surrounding erythema, ascending cellulitis.  No malodor.     Vascular: Dorsalis Pedis artery and Posterior Tibial artery pedal pulses are palpable bilateral with immedate capillary fill time.  There is no pain with calf compression, swelling, warmth, erythema.   Neruologic: Grossly intact via light touch bilateral.   Musculoskeletal: No significant pain present.  Muscular strength 5/5 in all groups tested bilateral.       Assessment:   Preulcerative area of right posterior heel  Plan:  -Treatment options discussed including all alternatives, risks, and complications -Etiology of symptoms were discussed -I dispensed heel pillows to avoid any pressure to the heel.  Also dispensed Allevyn heel cups to help with the pressure.  Discussed floating the heels as well.  Monitor for any signs or symptoms of infection or any openings. -If not improving ABI next appointment  Return in about 2 weeks (around 06/10/2021).  Trula Slade DPM

## 2021-06-14 ENCOUNTER — Other Ambulatory Visit: Payer: Self-pay

## 2021-06-14 ENCOUNTER — Ambulatory Visit (INDEPENDENT_AMBULATORY_CARE_PROVIDER_SITE_OTHER): Payer: Medicare HMO | Admitting: Family Medicine

## 2021-06-14 ENCOUNTER — Ambulatory Visit: Payer: Medicare HMO | Admitting: Nurse Practitioner

## 2021-06-14 ENCOUNTER — Encounter: Payer: Self-pay | Admitting: Family Medicine

## 2021-06-14 VITALS — BP 130/80 | HR 74 | Resp 16 | Ht 65.0 in | Wt 117.1 lb

## 2021-06-14 DIAGNOSIS — N183 Chronic kidney disease, stage 3 unspecified: Secondary | ICD-10-CM

## 2021-06-14 DIAGNOSIS — Z9181 History of falling: Secondary | ICD-10-CM

## 2021-06-14 DIAGNOSIS — E1165 Type 2 diabetes mellitus with hyperglycemia: Secondary | ICD-10-CM | POA: Diagnosis not present

## 2021-06-14 DIAGNOSIS — I10 Essential (primary) hypertension: Secondary | ICD-10-CM | POA: Diagnosis not present

## 2021-06-14 DIAGNOSIS — E782 Mixed hyperlipidemia: Secondary | ICD-10-CM | POA: Diagnosis not present

## 2021-06-14 DIAGNOSIS — E559 Vitamin D deficiency, unspecified: Secondary | ICD-10-CM

## 2021-06-14 NOTE — Patient Instructions (Addendum)
Annual exam in  mid May, call if you need me before, bring meds to visit  ? ? ?NO MORE LADDERS PLEASE ? ?Need shingrix vaccines, start this week ? ?Fasting lipid, cmp and EGFR, microalb an d vit D  and CBC first week in May ? ?No MORE falls please  ? ?Thanks for choosing [facility], we consider it a privelige to serve you. ? ?

## 2021-06-14 NOTE — Patient Instructions (Incomplete)
Diabetes Mellitus Emergency Preparedness Plan ?A diabetes emergency preparedness plan is a checklist to make sure you have everything you need to manage your diabetes in case of an emergency, such as an evacuation, natural disaster, national security emergency, or pandemic lockdown. ?Managing your diabetes is something you have to do all day every day. The American Diabetes Association and the American College of Endocrinology both recommend putting together an emergency diabetes kit. Your kit should include important information and documents as well as all the supplies you will need to manage your diabetes for at least 1 week. Store it in a portable, waterproof bag or container. The best time to start making your emergency kit is now. ?How to make your emergency kit ?Collect information and documents ?Include the following information and documents in your kit: ?The type of diabetes you have. ?A copy of your health insurance cards and photo ID. ?A list of all your other medical conditions, allergies, and surgeries. ?A list of all your medicines and doses with the contact information for your pharmacy. Ask your health care provider for a list of your current medicines. ?Any recent lab results, including your latest hemoglobin A1C (HbA1C). ?The make, model, and serial number of your insulin pump, if you use one. Also include contact information for the manufacturer. ?Contact information for people who should be notified in case of an emergency. Include your health care provider's name, address, and phone number. ?Collect diabetes care items ?Include the following diabetes care items in your kit: ?At least a 1-week supply of: ?Oral medicines. ?Insulin. ?Blood glucose testing supplies. These include testing strips, lancets, and extra batteries for your blood glucose monitor and pump. ?A charger for the continuous glucose monitor (CGM) receiver and pump. ?Any extra supplies needed for your CGM or pump. ?A supply of  glucagon, glucose tablets, juice, soda, or hard candy in case of hypoglycemia. ?Coolers or cold packs. ?A safe container for syringes, needles, and lancets. ? ?Other preparations ?Other things to consider doing as part of your emergency plan: ?Make sure that your mobile phone is charged and that you have an extra charger, cable, or batteries. ?Choose a meeting place for family members. ?Wear a medical alert or ID bracelet. ?If you have a child with diabetes, make sure your child's school has a copy of his or her emergency plan, including the name of the staff member who will assist your child. ?Where to find more information ?American Diabetes Association: www.diabetes.org ?Centers for Disease Control and Prevention: blogs.cdc.gov ?Summary ?A diabetes emergency preparedness plan is a checklist to make sure you have everything you need in case of an emergency. ?Your kit should include important information and documents as well as all the supplies you will need to manage your condition for at least 1 week. ?Store your kit in a portable, waterproof bag or container. ?The best time to start making your emergency kit is now. ?This information is not intended to replace advice given to you by your health care provider. Make sure you discuss any questions you have with your health care provider. ?Document Revised: 10/02/2019 Document Reviewed: 10/02/2019 ?Elsevier Patient Education ? 2022 Elsevier Inc. ? ?

## 2021-06-16 LAB — CBC
Hematocrit: 36.9 % — ABNORMAL LOW (ref 37.5–51.0)
Hemoglobin: 12.2 g/dL — ABNORMAL LOW (ref 13.0–17.7)
MCH: 30.3 pg (ref 26.6–33.0)
MCHC: 33.1 g/dL (ref 31.5–35.7)
MCV: 92 fL (ref 79–97)
Platelets: 128 10*3/uL — ABNORMAL LOW (ref 150–450)
RBC: 4.02 x10E6/uL — ABNORMAL LOW (ref 4.14–5.80)
RDW: 12.9 % (ref 11.6–15.4)
WBC: 6.5 10*3/uL (ref 3.4–10.8)

## 2021-06-16 LAB — CMP14+EGFR
ALT: 13 IU/L (ref 0–44)
AST: 24 IU/L (ref 0–40)
Albumin/Globulin Ratio: 0.8 — ABNORMAL LOW (ref 1.2–2.2)
Albumin: 4 g/dL (ref 3.6–4.6)
Alkaline Phosphatase: 158 IU/L — ABNORMAL HIGH (ref 44–121)
BUN/Creatinine Ratio: 17 (ref 10–24)
BUN: 23 mg/dL (ref 8–27)
Bilirubin Total: 0.3 mg/dL (ref 0.0–1.2)
CO2: 24 mmol/L (ref 20–29)
Calcium: 9 mg/dL (ref 8.6–10.2)
Chloride: 103 mmol/L (ref 96–106)
Creatinine, Ser: 1.38 mg/dL — ABNORMAL HIGH (ref 0.76–1.27)
Globulin, Total: 4.9 g/dL — ABNORMAL HIGH (ref 1.5–4.5)
Glucose: 131 mg/dL — ABNORMAL HIGH (ref 70–99)
Potassium: 4.5 mmol/L (ref 3.5–5.2)
Sodium: 139 mmol/L (ref 134–144)
Total Protein: 8.9 g/dL — ABNORMAL HIGH (ref 6.0–8.5)
eGFR: 51 mL/min/{1.73_m2} — ABNORMAL LOW (ref >59)

## 2021-06-16 LAB — LIPID PANEL
Chol/HDL Ratio: 1.7 ratio (ref 0.0–5.0)
Cholesterol, Total: 90 mg/dL — ABNORMAL LOW (ref 100–199)
HDL: 52 mg/dL (ref >39)
LDL Chol Calc (NIH): 26 mg/dL (ref 0–99)
Triglycerides: 46 mg/dL (ref 0–149)
VLDL Cholesterol Cal: 12 mg/dL (ref 5–40)

## 2021-06-16 LAB — MICROALBUMIN / CREATININE URINE RATIO
Creatinine, Urine: 133.4 mg/dL
Microalb/Creat Ratio: 216 mg/g creat — ABNORMAL HIGH (ref 0–29)
Microalbumin, Urine: 288.1 ug/mL

## 2021-06-16 LAB — VITAMIN D 25 HYDROXY (VIT D DEFICIENCY, FRACTURES): Vit D, 25-Hydroxy: 52.1 ng/mL (ref 30.0–100.0)

## 2021-06-17 ENCOUNTER — Ambulatory Visit: Payer: Medicare HMO | Admitting: Podiatry

## 2021-06-17 ENCOUNTER — Other Ambulatory Visit: Payer: Self-pay

## 2021-06-17 DIAGNOSIS — E1165 Type 2 diabetes mellitus with hyperglycemia: Secondary | ICD-10-CM | POA: Diagnosis not present

## 2021-06-17 DIAGNOSIS — L89600 Pressure ulcer of unspecified heel, unstageable: Secondary | ICD-10-CM

## 2021-06-17 DIAGNOSIS — L84 Corns and callosities: Secondary | ICD-10-CM | POA: Diagnosis not present

## 2021-06-20 ENCOUNTER — Ambulatory Visit (INDEPENDENT_AMBULATORY_CARE_PROVIDER_SITE_OTHER): Payer: Medicare HMO

## 2021-06-20 ENCOUNTER — Other Ambulatory Visit: Payer: Self-pay

## 2021-06-20 DIAGNOSIS — Z0000 Encounter for general adult medical examination without abnormal findings: Secondary | ICD-10-CM

## 2021-06-20 NOTE — Progress Notes (Unsigned)
Subjective:   Jacob Barnes is a 81 y.o. male who presents for Medicare Annual/Subsequent preventive examination. I connected with  [name] on 06/20/21 by a audio enabled telemedicine application and verified that I am speaking with the correct person using two identifiers.  Patient Location: Home  Provider Location: Home Office  I discussed the limitations of evaluation and management by telemedicine. The patient expressed understanding and agreed to proceed.  Review of Systems    ***       Objective:    There were no vitals filed for this visit. There is no height or weight on file to calculate BMI.  Advanced Directives 05/03/2021 02/11/2020 02/10/2019 01/21/2018 10/10/2016 09/01/2016 08/30/2016  Does Patient Have a Medical Advance Directive? No No No No No No No  Would patient like information on creating a medical advance directive? No - Patient declined No - Patient declined - Yes (ED - Information included in AVS) No - Patient declined No - Patient declined No - Patient declined  Pre-existing out of facility DNR order (yellow form or pink MOST form) - - - - - - -    Current Medications (verified) Outpatient Encounter Medications as of 06/20/2021  Medication Sig   benazepril (LOTENSIN) 20 MG tablet Take 1 tablet (20 mg total) by mouth daily.   blood glucose meter kit and supplies Dispense based on patient and insurance preference. Use up to three times daily as directed. (dx E11.65).   CALCIUM PO Take 1 tablet by mouth daily.   Cholecalciferol (VITAMIN D3 PO) Take 1 tablet by mouth daily.   Continuous Blood Gluc Receiver (FREESTYLE LIBRE 2 READER) DEVI 1 each by Does not apply route in the morning, at noon, in the evening, and at bedtime. Use to test BG qid   Continuous Blood Gluc Sensor (FREESTYLE LIBRE 2 SENSOR) MISC 1 each by Does not apply route every 14 (fourteen) days.   donepezil (ARICEPT) 10 MG tablet Take 1 tablet (10 mg total) by mouth at bedtime. (Patient taking  differently: Take 10 mg by mouth daily.)   dorzolamide-timolol (COSOPT) 22.3-6.8 MG/ML ophthalmic solution Place 1 drop into both eyes 2 (two) times daily.   insulin degludec (TRESIBA FLEXTOUCH) 100 UNIT/ML FlexTouch Pen Inject 10 Units into the skin at bedtime.   Insulin Pen Needle (PEN NEEDLES) 31G X 6 MM MISC 1 each by Does not apply route at bedtime.   Iron, Ferrous Sulfate, 325 (65 Fe) MG TABS Take one tablet by mouth once daily (Patient taking differently: Take 325 mg by mouth daily.)   latanoprost (XALATAN) 0.005 % ophthalmic solution Place 1 drop into both eyes at bedtime.   methocarbamol (ROBAXIN) 500 MG tablet Take 1 tablet (500 mg total) by mouth every 6 (six) hours as needed for muscle spasms.   ONETOUCH VERIO test strip USE AS DIRECTED UP TO 4 TIMES DAILY   pantoprazole (PROTONIX) 40 MG tablet Take 1 tablet (40 mg total) by mouth daily.   Probiotic Product (PROBIOTIC DAILY PO) Take 1 tablet by mouth daily.   rosuvastatin (CRESTOR) 5 MG tablet Take one tablet by mouth every Monday , Wednesday and Friday (Patient taking differently: Take 5 mg by mouth every Monday, Wednesday, and Friday.)   TRADJENTA 5 MG TABS tablet TAKE 1 TABLET BY MOUTH EVERY DAY (Patient taking differently: Take 5 mg by mouth daily.)   No facility-administered encounter medications on file as of 06/20/2021.    Allergies (verified) Patient has no known allergies.  History: 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   Anemia    Arthritis    hands   Carpal tunnel syndrome 08/05/2009   Qualifier: Diagnosis of  By: Aline Brochure MD, [name]     Chronic kidney disease    Diabetes mellitus    says since 1979 type 2   Diabetes mellitus without complication ([place])    Phreesia 04/03/2020   ERECTILE DYSFUNCTION, ORGANIC 01/20/2009   Qualifier: Diagnosis of  By: Moshe Cipro MD, Margaret     GERD (gastroesophageal  reflux disease)    Glaucoma    both eyes   History of kidney stones    Hyperlipemia    Hypertension    Iron deficiency anemia 01/10/2014   Other pancytopenia ([place]) 01/10/2014   New in 01/2014, undergoing pathology review    Rash    front abdomen no drainage   Past Surgical History:  Procedure Laterality Date   AMPUTATION Right 09/01/2016   Procedure: AMPUTATION RIGHT MIDDLE FINGER TIP;  Surgeon: Mcarthur Rossetti, MD;  [facility]: [facility];  Service: Orthopedics;  Laterality: Right;   AMPUTATION Right 10/10/2016   Procedure: REPEAT IRRIGATION AND DEBRIDEMENT RIGHT MIDDLE FINGER WITH AMPUTATION THROUGH PROXIMAL PHALANX;  Surgeon: Mcarthur Rossetti, MD;  Location: [place];  Service: Orthopedics;  Laterality: Right;   APPENDECTOMY     COLONOSCOPY  09/06/2011   Procedure: COLONOSCOPY;  Surgeon: Rogene Houston, MD;  [facility];  Service: Endoscopy;  Laterality: N/A;  830   CYSTOSCOPY/RETROGRADE/URETEROSCOPY/STONE EXTRACTION WITH BASKET     ESOPHAGOGASTRODUODENOSCOPY N/A 01/15/2014   Procedure: ESOPHAGOGASTRODUODENOSCOPY (EGD);  Surgeon: Rogene Houston, MD;  [facility];  Service: Endoscopy;  Laterality: N/A;  200   ESOPHAGOGASTRODUODENOSCOPY N/A 04/22/2014   Procedure: ESOPHAGOGASTRODUODENOSCOPY (EGD);  Surgeon: Rogene Houston, MD;  [facility];  Service: Endoscopy;  Laterality: N/A;  240   EYE SURGERY Bilateral yrs ago   ioc for cataract   MALONEY DILATION N/A 01/15/2014   Procedure: MALONEY DILATION;  Surgeon: Rogene Houston, MD;  [facility];  Service: Endoscopy;  Laterality: N/A;   MALONEY DILATION  04/22/2014   Procedure: Venia Minks DILATION;  Surgeon: Rogene Houston, MD;  [facility];  Service: Endoscopy;;   surgery for left eye bleedf  2017   Family History  Problem Relation Age of Onset   Diabetes Mother    Hypertension Mother    Hyperlipidemia Mother    Stroke Mother    Heart failure Mother    Diabetes Father     Heart disease Father    Hypertension Father    Hyperlipidemia Father    Heart attack Father    Diabetes Brother    Diabetes Brother    Diabetes Son    Social History   Socioeconomic History   Marital status: Married    Spouse name: Not on file   Number of children: Not on file   Years of education: Not on file   Highest education level: Not on file  Occupational History   Not on file  Tobacco Use   Smoking status: Never   Smokeless tobacco: Never  Vaping Use   Vaping Use: Never used  Substance and Sexual Activity   Alcohol use: No   Drug use: No   Sexual activity: Yes  Other Topics Concern   Not on file  Social History Narrative   Not on file   Social Determinants of Health   Financial Resource Strain: Not on file  Food Insecurity: Not on file  Transportation Needs: Not on file  Physical Activity: Not on file  Stress: Not on file  Social Connections: Not on file    Tobacco Counseling Counseling given: Not Answered   Clinical Intake:                 Diabetic?***         Activities of Daily Living No flowsheet data found.  Patient Care Team: Fayrene Helper, MD as PCP - General Zigmund Daniel Chrystie Nose, MD as Consulting Physician (Ophthalmology)  Indicate any recent Medical Services you may have received from other than [facility] providers in the past year (date may be approximate).     Assessment:   This is a routine wellness examination for Solly.  Hearing/Vision screen No results found.  Dietary issues and exercise activities discussed:     Goals Addressed   None    Depression Screen PHQ 2/9 Scores 05/20/2021 03/29/2021 01/20/2021 08/03/2020 06/16/2020 05/05/2020 04/06/2020  PHQ - 2 Score 0 0 1 0 0 0 0  PHQ- 9 Score - - 1 - - - -    Fall Risk Fall Risk  05/20/2021 03/29/2021 01/20/2021 11/01/2020 08/03/2020  Falls in the past year? 1 1 0 1 1  Number falls in past yr: 1 1 - 0 1  Injury with Fall? 1 1 - 0 0  Comment cracked ribs - - - -   Risk for fall due to : History of fall(s) - - - History of fall(s);Impaired balance/gait  Follow up Falls evaluation completed - - - Falls evaluation completed    FALL RISK PREVENTION PERTAINING TO THE HOME:  Any stairs in or around the home? {YES/NO:21197} If so, are there any without handrails? {YES/NO:21197} Home free of loose throw rugs in walkways, pet beds, electrical cords, etc? {YES/NO:21197} Adequate lighting in your home to reduce risk of falls? {YES/NO:21197}  ASSISTIVE DEVICES UTILIZED TO PREVENT FALLS:  Life alert? {YES/NO:21197} Use of a cane, walker or w/c? {YES/NO:21197} Grab bars in the bathroom? {YES/NO:21197} Shower chair or bench in shower? {YES/NO:21197} Elevated toilet seat or a handicapped toilet? {YES/NO:21197}  TIMED UP AND GO:  Was the test performed? {YES/NO:21197}.  Length of time to ambulate 10 feet: *** sec.   {Appearance of Gait:2101803}  Cognitive Function: MMSE - Mini Mental State Exam 01/20/2021 10/15/2018  Orientation to time 4 5  Orientation to Place 3 5  Registration 3 3  Attention/ Calculation 2 5  Recall 0 3  Language- name 2 objects 2 2  Language- repeat 1 1  Language- follow 3 step command 3 3  Language- read & follow direction 1 1  Write a sentence 1 1  Copy design 0 0  Total score 20 29     6CIT Screen 02/11/2020 02/10/2019 01/21/2018  What Year? 0 points 0 points 0 points  What month? 0 points 0 points 0 points  What time? 0 points 0 points 0 points  Count back from 20 4 points 0 points 0 points  Months in reverse 4 points 0 points 0 points  Repeat phrase 2 points 0 points 4 points  Total Score 10 0 4    Immunizations Immunization History  Administered Date(s) Administered   Fluad Quad(high Dose 65+) 02/27/2019, 01/20/2021   Influenza,inj,Quad PF,6+ Mos 03/17/2013, 03/17/2014, 03/18/2015, 01/04/2016, 12/05/2016, 12/02/2019   Influenza-Unspecified 01/17/2017  Moderna Sars-Covid-2 Vaccination 06/23/2019, 07/21/2019,  02/12/2020   PPD Test 08/16/2010, 08/18/2010, 11/03/2013   Pneumococcal Conjugate-13 11/03/2013   Pneumococcal Polysaccharide-23 12/21/2008   Td 12/21/2008    {TDAP status:2101805}  {Flu Vaccine status:2101806}  {Pneumococcal vaccine status:2101807}  {Covid-19 vaccine status:2101808}  Qualifies for Shingles Vaccine? {YES/NO:21197}  Zostavax completed {YES/NO:21197}  {Shingrix Completed?:2101804}  Screening Tests Health Maintenance  Topic Date Due   Zoster Vaccines- Shingrix (1 of 2) Never done   OPHTHALMOLOGY EXAM  10/09/2017   TETANUS/TDAP  12/22/2018   COVID-19 Vaccine (4 - Booster for Moderna series) 04/08/2020   HEMOGLOBIN A1C  09/27/2021   FOOT EXAM  01/24/2022   Pneumonia Vaccine 34+ Years old  Completed   INFLUENZA VACCINE  Completed   HPV VACCINES  Aged Out    Health Maintenance  Health Maintenance Due  Topic Date Due   Zoster Vaccines- Shingrix (1 of 2) Never done   OPHTHALMOLOGY EXAM  10/09/2017   TETANUS/TDAP  12/22/2018   COVID-19 Vaccine (4 - Booster for Moderna series) 04/08/2020    {Colorectal cancer screening:2101809}  Lung Cancer Screening: (Low Dose CT Chest recommended if Age 82-80 years, 30 pack-year currently smoking OR have quit w/in 15years.) {DOES NOT does:27190::"does not"} qualify.   Lung Cancer Screening Referral: ***  Additional Screening:  Hepatitis C Screening: {DOES NOT does:27190::"does not"} qualify; Completed ***  Vision Screening: Recommended annual ophthalmology exams for early detection of glaucoma and other disorders of the eye. Is the patient up to date with their annual eye exam?  {YES/NO:21197} Who is the provider or what is the name of the office in which the patient attends annual eye exams? *** If pt is not established with a provider, would they like to be referred to a provider to establish care? {YES/NO:21197}.   Dental Screening: Recommended annual dental exams for proper oral hygiene  Community Resource  Referral / Chronic Care Management: CRR required this visit?  {YES/NO:21197}  CCM required this visit?  {YES/NO:21197}     Plan:     I have personally reviewed and noted the following in the patients chart:   Medical and social history Use of alcohol, tobacco or illicit drugs  Current medications and supplements including opioid prescriptions. {Opioid Prescriptions:[phone]} Functional ability and status Nutritional status Physical activity Advanced directives List of other physicians Hospitalizations, surgeries, and ER visits in previous 12 months Vitals Screenings to include cognitive, depression, and falls Referrals and appointments  In addition, I have reviewed and discussed with patient certain preventive protocols, quality metrics, and best practice recommendations. A written personalized care plan for preventive services as well as general preventive health recommendations were provided to patient.     Jill Side, [place]   06/20/2021   Nurse Notes: ***

## 2021-06-22 NOTE — Progress Notes (Signed)
Subjective: ?81 year old male presents the office today for follow-up evaluation of blister, wound to the back of his right heel.  He has not seen any drainage or pus or any swelling or redness.  He has no pain associate with this.  The left heel is been doing well without any skin breakdown that he has noticed.  Denies any fevers or chills.  No other concerns. ? ?Objective: ?AAO x3, NAD ?DP/PT pulses palpable bilaterally, CRT less than 3 seconds ?On the posterior aspect of the right heel at this dried hemorrhagic blister which is starting to harden up and callus.  The picture below is prior to debridement but after is able to debride this the majority of the darkened tissue came off and there is new underlying healthy, pink skin present.  There is no skin breakdown.  There is no drainage or pus or any fluctuation or crepitation.  There is no malodor. ?No pain with calf compression, swelling, warmth, erythema ? ? ? ? ?Assessment: ?Preulcerative area right heel, hemorrhagic blister ? ?Plan: ?-All treatment options discussed with the patient including all alternatives, risks, complications.  ?-I was able to sharply.  The majority of the callus, dried hemorrhagic blister.  There is underlying healthy skin present.  Recommend moisturizer.  Continue offloading at all times.  Monitor for any signs or symptoms of infection. ?-Patient encouraged to call the office with any questions, concerns, change in symptoms.  ? ?Return in about 3 weeks (around 07/08/2021). ? ?Trula Slade DPM ? ?

## 2021-06-26 ENCOUNTER — Other Ambulatory Visit: Payer: Self-pay | Admitting: Family Medicine

## 2021-06-27 ENCOUNTER — Other Ambulatory Visit: Payer: Self-pay

## 2021-07-04 ENCOUNTER — Ambulatory Visit (INDEPENDENT_AMBULATORY_CARE_PROVIDER_SITE_OTHER): Payer: Medicare HMO

## 2021-07-04 ENCOUNTER — Encounter: Payer: Self-pay | Admitting: Family Medicine

## 2021-07-04 ENCOUNTER — Other Ambulatory Visit: Payer: Self-pay

## 2021-07-04 DIAGNOSIS — Z0000 Encounter for general adult medical examination without abnormal findings: Secondary | ICD-10-CM | POA: Diagnosis not present

## 2021-07-04 NOTE — Progress Notes (Signed)
? ?Subjective:  ? Jacob Barnes is a 81 y.o. male who presents for Medicare Annual/Subsequent preventive examination. ?I connected with  Jacob Barnes on 07/04/21 by a audio enabled telemedicine application and verified that I am speaking with the correct person using two identifiers. ? ?Patient Location: Home ? ?Provider Location: Office/Clinic ? ?I discussed the limitations of evaluation and management by telemedicine. The patient expressed understanding and agreed to proceed.  ?Review of Systems    ? ?Cardiac Risk Factors include: advanced age (>65mn, >>52women);diabetes mellitus;male gender;hypertension ? ?   ?Objective:  ?  ?Today's Vitals  ? 07/04/21 1021  ?PainSc: 0-No pain  ? ?There is no height or weight on file to calculate BMI. ? ? ?  07/04/2021  ? 10:30 AM 07/04/2021  ? 10:29 AM 05/03/2021  ?  1:58 PM 02/11/2020  ? 11:45 AM 02/10/2019  ? 11:59 AM 01/21/2018  ?  1:39 PM 10/10/2016  ?  3:08 PM  ?Advanced Directives  ?Does Patient Have a Medical Advance Directive? _0  No No  ?Would patient like information on creating a medical advance directive? Yes (ED - Information included in AVS)  No - Patient declined No - Patient declined  Yes (ED - Information included in AVS) No - Patient declined  ? ? ?Current Medications (verified) ?Outpatient Encounter Medications as of 07/04/2021  ?Medication Sig  ? benazepril (LOTENSIN) 20 MG tablet Take 1 tablet (20 mg total) by mouth daily.  ? blood glucose meter kit and supplies Dispense based on patient and insurance preference. Use up to three times daily as directed. (dx E11.65).  ? CALCIUM PO Take 1 tablet by mouth daily.  ? Continuous Blood Gluc Receiver (FREESTYLE LIBRE 2 READER) DEVI 1 each by Does not apply route in the morning, at noon, in the evening, and at bedtime. Use to test BG qid  ? Continuous Blood Gluc Sensor (FREESTYLE LIBRE 2 SENSOR) MISC 1 each by Does not apply route every 14 (fourteen) days.  ? donepezil (ARICEPT) 10 MG tablet Take 1 tablet (10 mg  total) by mouth at bedtime. (Patient taking differently: Take 10 mg by mouth daily.)  ? dorzolamide-timolol (COSOPT) 22.3-6.8 MG/ML ophthalmic solution Place 1 drop into both eyes 2 (two) times daily.  ? insulin degludec (TRESIBA FLEXTOUCH) 100 UNIT/ML FlexTouch Pen Inject 10 Units into the skin at bedtime.  ? Insulin Pen Needle (PEN NEEDLES) 31G X 6 MM MISC 1 each by Does not apply route at bedtime.  ? Iron, Ferrous Sulfate, 325 (65 Fe) MG TABS Take one tablet by mouth once daily (Patient taking differently: Take 325 mg by mouth daily.)  ? latanoprost (XALATAN) 0.005 % ophthalmic solution Place 1 drop into both eyes at bedtime.  ? methocarbamol (ROBAXIN) 500 MG tablet Take 1 tablet (500 mg total) by mouth every 6 (six) hours as needed for muscle spasms.  ? ONETOUCH VERIO test strip USE AS DIRECTED UP TO 4 TIMES DAILY  ? pantoprazole (PROTONIX) 40 MG tablet TAKE 1 TABLET BY MOUTH EVERY DAY  ? Probiotic Product (PROBIOTIC DAILY PO) Take 1 tablet by mouth daily.  ? rosuvastatin (CRESTOR) 5 MG tablet Take one tablet by mouth every Monday , Wednesday and Friday (Patient taking differently: Take 5 mg by mouth every Monday, Wednesday, and Friday.)  ? TRADJENTA 5 MG TABS tablet TAKE 1 TABLET BY MOUTH EVERY DAY (Patient taking differently: Take 5 mg by mouth daily.)  ? Cholecalciferol (VITAMIN D3 PO) Take 1 tablet by  mouth daily.  ? ?No facility-administered encounter medications on file as of 07/04/2021.  ? ? ?Allergies (verified) ?Patient has no known allergies.  ? ?History: ?Past Medical History:  ?Diagnosis Date  ? Abrasion of right middle finger with infection   ? for 1 week,   ? Amputation of right middle finger 10/10/2016  ? Anemia   ? Arthritis   ? hands  ? Carpal tunnel syndrome 08/05/2009  ? Qualifier: Diagnosis of  By: Aline Brochure MD, Dorothyann Peng    ? Chronic kidney disease   ? Diabetes mellitus   ? says since 1979 type 2  ? Diabetes mellitus without complication ([place])   ? Phreesia 04/03/2020  ? ERECTILE DYSFUNCTION,  ORGANIC 01/20/2009  ? Qualifier: Diagnosis of  By: Moshe Cipro MD, Joycelyn Schmid    ? GERD (gastroesophageal reflux disease)   ? Glaucoma   ? both eyes  ? History of kidney stones   ? Hyperlipemia   ? Hypertension   ? Iron deficiency anemia 01/10/2014  ? Other pancytopenia ([place]) 01/10/2014  ? New in 01/2014, undergoing pathology review   ? Rash   ? front abdomen no drainage  ? ?Past Surgical History:  ?Procedure Laterality Date  ? AMPUTATION Right 09/01/2016  ? Procedure: AMPUTATION RIGHT MIDDLE FINGER TIP;  Surgeon: Mcarthur Rossetti, MD;  [facility];  Service: Orthopedics;  Laterality: Right;  ? AMPUTATION Right 10/10/2016  ? Procedure: REPEAT IRRIGATION AND DEBRIDEMENT RIGHT MIDDLE FINGER WITH AMPUTATION THROUGH PROXIMAL PHALANX;  Surgeon: Mcarthur Rossetti, MD;  Location: [place];  Service: Orthopedics;  Laterality: Right;  ? APPENDECTOMY    ? COLONOSCOPY  09/06/2011  ? Procedure: COLONOSCOPY;  Surgeon: Rogene Houston, MD;  [facility];  Service: Endoscopy;  Laterality: N/A;  830  ? CYSTOSCOPY/RETROGRADE/URETEROSCOPY/STONE EXTRACTION WITH BASKET    ? ESOPHAGOGASTRODUODENOSCOPY N/A 01/15/2014  ? Procedure: ESOPHAGOGASTRODUODENOSCOPY (EGD);  Surgeon: Rogene Houston, MD;  [facility];  Service: Endoscopy;  Laterality: N/A;  200  ? ESOPHAGOGASTRODUODENOSCOPY N/A 04/22/2014  ? Procedure: ESOPHAGOGASTRODUODENOSCOPY (EGD);  Surgeon: Rogene Houston, MD;  [facility];  Service: Endoscopy;  Laterality: N/A;  240  ? EYE SURGERY Bilateral yrs ago  ? ioc for cataract  ? MALONEY DILATION N/A 01/15/2014  ? Procedure: MALONEY DILATION;  Surgeon: Rogene Houston, MD;  [facility];  Service: Endoscopy;  Laterality: N/A;  ? MALONEY DILATION  04/22/2014  ? Procedure: MALONEY DILATION;  Surgeon: Rogene Houston, MD;  [facility];  Service: Endoscopy;;  ? surgery for left eye bleedf  2017  ? ?Family History  ?Problem Relation Age of Onset  ? Diabetes Mother   ? Hypertension  Mother   ? Hyperlipidemia Mother   ? Stroke Mother   ? Heart failure Mother   ? Diabetes Father   ? Heart disease Father   ? Hypertension Father   ? Hyperlipidemia Father   ? Heart attack Father   ? Diabetes Brother   ? Diabetes Brother   ? Diabetes Son   ? ?Social History  ? ?Socioeconomic History  ? Marital status: Married  ?  Spouse name: Not on file  ? Number of children: Not on file  ? Years of education: Not on file  ? Highest education level: Not on file  ?Occupational History  ? Not on file  ?Tobacco Use  ? Smoking status: Never  ? Smokeless tobacco: Never  ?Vaping Use  ? Vaping Use: Never used  ?Substance and Sexual  Activity  ? Alcohol use: No  ? Drug use: No  ? Sexual activity: Yes  ?Other Topics Concern  ? Not on file  ?Social History Narrative  ? Not on file  ? ?Social Determinants of Health  ? ?Financial Resource Strain: Not on file  ?Food Insecurity: Not on file  ?Transportation Needs: Not on file  ?Physical Activity: Not on file  ?Stress: Not on file  ?Social Connections: Not on file  ? ? ?Tobacco Counseling ?Counseling given: Not Answered ? ? ?Clinical Intake: ? ?  ? ?Pain : No/denies pain ?Pain Score: 0-No pain ? ?  ? ?Diabetes: Yes ?CBG done?: No ?Did pt. bring in CBG monitor from home?: No ? ?How often do you need to have someone help you when you read instructions, pamphlets, or other written materials from your doctor or pharmacy?: 1 - Never ? ?Diabetic?yes ? ? Nutrition Risk Assessment: ? ?Has the patient had any N/V/D within the last 2 months?  Yes  ?Does the patient have any non-healing wounds?  No  ?Has the patient had any unintentional weight loss or weight gain?  No  ? ?Diabetes: ? ?Is the patient diabetic?  Yes  ?If diabetic, was a CBG obtained today?  No  ?Did the patient bring in their glucometer from home?     ?How often do you monitor your CBG's? daily.  ? ?Financial Strains and Diabetes Management: ? ?Are you having any financial strains with the device, your supplies or your  medication? No .  ?Does the patient want to be seen by Chronic Care Management for management of their diabetes?  No  ?Would the patient like to be referred to a Nutritionist or for Diabetic Management?  No  ? ?Diab

## 2021-07-04 NOTE — Assessment & Plan Note (Signed)
Hyperlipidemia:Low fat diet discussed and encouraged. ? ? ?Lipid Panel  ?Lab Results  ?Component Value Date  ? CHOL 90 (L) 06/14/2021  ? HDL 52 06/14/2021  ? [place] 26 06/14/2021  ? TRIG 46 06/14/2021  ? CHOLHDL 1.7 06/14/2021  ? ?Controlled, no change in medication ? ? ? ?

## 2021-07-04 NOTE — Assessment & Plan Note (Signed)
Controlled, no change in medication ?DASH diet and commitment to daily physical activity for a minimum of 30 minutes discussed and encouraged, as a part of hypertension management. ?The importance of attaining a healthy weight is also discussed. ? ? ?  06/14/2021  ?  2:40 PM 06/14/2021  ?  1:44 PM 05/20/2021  ?  3:04 PM 05/09/2021  ?  8:42 AM 05/09/2021  ?  4:11 AM 05/08/2021  ? 11:19 PM 05/08/2021  ?  7:28 PM  ?BP/Weight  ?Systolic BP 517 001 749 449 98 109 131  ?Diastolic BP 80 79 62 61 54 54 79  ?Wt. (Lbs)  117.12 118.04      ?BMI  19.49 kg/m2 19.64 kg/m2      ? ? ? ? ?

## 2021-07-04 NOTE — Progress Notes (Signed)
? ?TORRENCE HAMMACK     MRN: [id number]      DOB: 1940-12-28 ? ? ?HPI ?Mr. Andreoni is here for follow up and re-evaluation of chronic medical conditions, medication management and review of any available recent lab and radiology data.  ?Preventive health is updated, specifically  Cancer screening and Immunization.   ?Questions or concerns regarding consultations or procedures which the PT has had in the interim are  addressed. ?The PT denies any adverse reactions to current medications since the last visit.  ?Recently fell off ladder, sustained hematoma and required hospitalization, recovering well. ?Denies polyuria, polydipsia, blurred vision , or hypoglycemic episodes. ?  ? ?ROS ?Denies recent fever or chills. ?Denies sinus pressure, nasal congestion, ear pain or sore throat. ?Denies chest congestion, productive cough or wheezing. ?Denies chest pains, palpitations and leg swelling ?Denies abdominal pain, nausea, vomiting,diarrhea or constipation.   ?Denies dysuria, frequency, hesitancy or incontinence. ?Denies joint pain, swelling and limitation in mobility. ?Denies headaches, seizures, numbness, or tingling. ?Denies depression, anxiety or insomnia. ?Denies skin break down or rash. ? ? ?PE ? ?BP 130/80   Pulse 74   Resp 16   Ht '5\' 5"'$  (1.651 m)   Wt 117 lb 1.9 oz (53.1 kg)   SpO2 97%   BMI 19.49 kg/m?  ? ?Patient alert and oriented and in no cardiopulmonary distress. ? ?HEENT: No facial asymmetry, EOMI,     Neck supple . ? ?Chest: Clear to auscultation bilaterally. ? ?CVS: S1, S2 no murmurs, no S3.Regular rate. ? ?ABD: Soft non tender.  ? ?Ext: No edema ? ?MS: Adequate ROM spine, shoulders, hips and knees. ? ?Skin: Intact, no ulcerations or rash noted. ? ?Psych: Good eye contact, normal affect. Memory intact not anxious or depressed appearing. ? ?CNS: CN 2-12 intact, power,  normal throughout.no focal deficits noted. ? ? ?Assessment & Plan ? ?Uncontrolled type 2 diabetes mellitus with hyperglycemia ([place]) ?Mr. Beals  is reminded of the importance of commitment to daily physical activity for 30 minutes or more, as able and the need to limit carbohydrate intake to 30 to 60 grams per meal to help with blood sugar control.  ? ?The need to take medication as prescribed, test blood sugar as directed, and to call between visits if there is a concern that blood sugar is uncontrolled is also discussed.  ? ?Mr. Jarchow is reminded of the importance of daily foot exam, annual eye examination, and good blood sugar, blood pressure and cholesterol control. ?Uncontrolled , managed by Endo and has upcoming appt ? ? ?  Latest Ref Rng & Units 06/14/2021  ?  2:54 PM 05/08/2021  ?  3:13 AM 05/07/2021  ?  2:23 AM 05/06/2021  ?  3:56 AM 05/05/2021  ?  3:36 AM  ?Diabetic Labs  ?Micro/Creat Ratio 0 - 29 mg/g creat 216        ?Chol 100 - 199 mg/dL 90        ?HDL >39 mg/dL 52        ?Calc LDL 0 - 99 mg/dL 26        ?Triglycerides 0 - 149 mg/dL 46        ?Creatinine 0.76 - 1.27 mg/dL 1.38   1.17   1.14   1.14   1.37    ? ? ?  06/14/2021  ?  2:40 PM 06/14/2021  ?  1:44 PM 05/20/2021  ?  3:04 PM 05/09/2021  ?  8:42 AM 05/09/2021  ?  4:11 AM 05/08/2021  ?  11:19 PM 05/08/2021  ?  7:28 PM  ?BP/Weight  ?Systolic BP 086 578 469 629 98 109 131  ?Diastolic BP 80 79 62 61 54 54 79  ?Wt. (Lbs)  117.12 118.04      ?BMI  19.49 kg/m2 19.64 kg/m2      ? ? ?  02/27/2019  ?  9:00 AM 01/21/2018  ?  1:40 PM  ?Foot/eye exam completion dates  ?Foot Form Completion Done Done  ? ? ? ? ? ? ?Mixed hyperlipidemia ?Hyperlipidemia:Low fat diet discussed and encouraged. ? ? ?Lipid Panel  ?Lab Results  ?Component Value Date  ? CHOL 90 (L) 06/14/2021  ? HDL 52 06/14/2021  ? [place] 26 06/14/2021  ? TRIG 46 06/14/2021  ? CHOLHDL 1.7 06/14/2021  ? ?Controlled, no change in medication ? ? ? ? ?At high risk for falls ?Home safety and fall risk reduction discussed ? ?Essential hypertension, benign ?Controlled, no change in medication ?DASH diet and commitment to daily physical activity for a minimum of 30  minutes discussed and encouraged, as a part of hypertension management. ?The importance of attaining a healthy weight is also discussed. ? ? ?  06/14/2021  ?  2:40 PM 06/14/2021  ?  1:44 PM 05/20/2021  ?  3:04 PM 05/09/2021  ?  8:42 AM 05/09/2021  ?  4:11 AM 05/08/2021  ? 11:19 PM 05/08/2021  ?  7:28 PM  ?BP/Weight  ?Systolic BP 528 413 244 010 98 109 131  ?Diastolic BP 80 79 62 61 54 54 79  ?Wt. (Lbs)  117.12 118.04      ?BMI  19.49 kg/m2 19.64 kg/m2      ? ? ? ? ? ?

## 2021-07-04 NOTE — Patient Instructions (Signed)
?  [name] , ?Thank you for taking time to come for your Medicare Wellness Visit. I appreciate your ongoing commitment to your health goals. Please review the following plan we discussed and let me know if I can assist you in the future.  ? ?These are the goals we discussed: ? Goals   ? ?  HEMOGLOBIN A1C < 7   ?  Increase physical activity   ?  Pt would like to get physically able to go on a trip. ?  ?  Patient Stated   ?  Patient states that he doesn't really have any goals, he just takes things as they come. ?  ?  Prevent falls   ? ?  ?  ?This is a list of the screening recommended for you and due dates:  ?Health Maintenance  ?Topic Date Due  ? Zoster (Shingles) Vaccine (1 of 2) Never done  ? Eye exam for diabetics  10/09/2017  ? Tetanus Vaccine  12/22/2018  ? COVID-19 Vaccine (4 - Booster for Moderna series) 04/08/2020  ? Hemoglobin A1C  09/27/2021  ? Complete foot exam   01/24/2022  ? Pneumonia Vaccine  Completed  ? Flu Shot  Completed  ? HPV Vaccine  Aged Out  ?  ?

## 2021-07-04 NOTE — Assessment & Plan Note (Signed)
Home safety and fall risk reduction discussed 

## 2021-07-04 NOTE — Assessment & Plan Note (Addendum)
[name] is reminded of the importance of commitment to daily physical activity for 30 minutes or more, as able and the need to limit carbohydrate intake to 30 to 60 grams per meal to help with blood sugar control.  ? ?The need to take medication as prescribed, test blood sugar as directed, and to call between visits if there is a concern that blood sugar is uncontrolled is also discussed.  ? ?[name] is reminded of the importance of daily foot exam, annual eye examination, and good blood sugar, blood pressure and cholesterol control. ?Uncontrolled , managed by Endo and has upcoming appt ? ? ?  Latest Ref Rng & Units 06/14/2021  ?  2:54 PM 05/08/2021  ?  3:13 AM 05/07/2021  ?  2:23 AM 05/06/2021  ?  3:56 AM 05/05/2021  ?  3:36 AM  ?Diabetic Labs  ?Micro/Creat Ratio 0 - 29 mg/g creat 216        ?Chol 100 - 199 mg/dL 90        ?HDL >39 mg/dL 52        ?Calc LDL 0 - 99 mg/dL 26        ?Triglycerides 0 - 149 mg/dL 46        ?Creatinine 0.76 - 1.27 mg/dL 1.38   1.17   1.14   1.14   1.37    ? ? ?  06/14/2021  ?  2:40 PM 06/14/2021  ?  1:44 PM 05/20/2021  ?  3:04 PM 05/09/2021  ?  8:42 AM 05/09/2021  ?  4:11 AM 05/08/2021  ? 11:19 PM 05/08/2021  ?  7:28 PM  ?BP/Weight  ?Systolic BP 509 326 712 458 98 109 131  ?Diastolic BP 80 79 62 61 54 54 79  ?Wt. (Lbs)  117.12 118.04      ?BMI  19.49 kg/m2 19.64 kg/m2      ? ? ?  02/27/2019  ?  9:00 AM 01/21/2018  ?  1:40 PM  ?Foot/eye exam completion dates  ?Foot Form Completion Done Done  ? ? ? ? ? ?

## 2021-07-05 ENCOUNTER — Ambulatory Visit: Payer: Medicare HMO | Admitting: Nurse Practitioner

## 2021-07-06 NOTE — Patient Instructions (Signed)

## 2021-07-07 ENCOUNTER — Encounter: Payer: Self-pay | Admitting: Nurse Practitioner

## 2021-07-07 ENCOUNTER — Ambulatory Visit: Payer: Medicare HMO | Admitting: Nurse Practitioner

## 2021-07-07 ENCOUNTER — Ambulatory Visit: Payer: Medicare HMO | Admitting: Podiatry

## 2021-07-07 VITALS — BP 138/73 | HR 73 | Ht 65.0 in | Wt 119.0 lb

## 2021-07-07 DIAGNOSIS — E1165 Type 2 diabetes mellitus with hyperglycemia: Secondary | ICD-10-CM

## 2021-07-07 DIAGNOSIS — E782 Mixed hyperlipidemia: Secondary | ICD-10-CM

## 2021-07-07 DIAGNOSIS — L84 Corns and callosities: Secondary | ICD-10-CM | POA: Diagnosis not present

## 2021-07-07 DIAGNOSIS — I10 Essential (primary) hypertension: Secondary | ICD-10-CM | POA: Diagnosis not present

## 2021-07-07 LAB — POCT GLYCOSYLATED HEMOGLOBIN (HGB A1C): HbA1c, POC (controlled diabetic range): 7 % (ref 0.0–7.0)

## 2021-07-07 NOTE — Progress Notes (Signed)
? ?                                                   Endocrinology Follow Up Visit  ?     07/08/2021, 7:27 AM ? ? ?Subjective:  ? ? Patient ID: Jacob Barnes, male    DOB: 06-Jul-1940.  ?Jacob Barnes is being seen in follow up after being seen in consultation for management of currently uncontrolled symptomatic diabetes requested by  Fayrene Helper, MD. ? ? ?Past Medical History:  ?Diagnosis Date  ? Abrasion of right middle finger with infection   ? for 1 week,   ? Amputation of right middle finger 10/10/2016  ? Anemia   ? Arthritis   ? hands  ? Carpal tunnel syndrome 08/05/2009  ? Qualifier: Diagnosis of  By: Aline Brochure MD, Dorothyann Peng    ? Chronic kidney disease   ? Diabetes mellitus   ? says since 1979 type 2  ? Diabetes mellitus without complication ([place])   ? Phreesia 04/03/2020  ? ERECTILE DYSFUNCTION, ORGANIC 01/20/2009  ? Qualifier: Diagnosis of  By: Moshe Cipro MD, Joycelyn Schmid    ? GERD (gastroesophageal reflux disease)   ? Glaucoma   ? both eyes  ? History of kidney stones   ? Hyperlipemia   ? Hypertension   ? Iron deficiency anemia 01/10/2014  ? Other pancytopenia ([place]) 01/10/2014  ? New in 01/2014, undergoing pathology review   ? Rash   ? front abdomen no drainage  ? ? ?Past Surgical History:  ?Procedure Laterality Date  ? AMPUTATION Right 09/01/2016  ? Procedure: AMPUTATION RIGHT MIDDLE FINGER TIP;  Surgeon: Mcarthur Rossetti, MD;  Location: [facility];  Service: Orthopedics;  Laterality: Right;  ? AMPUTATION Right 10/10/2016  ? Procedure: REPEAT IRRIGATION AND DEBRIDEMENT RIGHT MIDDLE FINGER WITH AMPUTATION THROUGH PROXIMAL PHALANX;  Surgeon: Mcarthur Rossetti, MD;  Location: [place];  Service: Orthopedics;  Laterality: Right;  ? APPENDECTOMY    ? COLONOSCOPY  09/06/2011  ? Procedure: COLONOSCOPY;  Surgeon: Rogene Houston, MD;  Location: [facility];  Service: Endoscopy;  Laterality: N/A;  830  ? CYSTOSCOPY/RETROGRADE/URETEROSCOPY/STONE EXTRACTION WITH BASKET    ? ESOPHAGOGASTRODUODENOSCOPY N/A  01/15/2014  ? Procedure: ESOPHAGOGASTRODUODENOSCOPY (EGD);  Surgeon: Rogene Houston, MD;  Location: [facility];  Service: Endoscopy;  Laterality: N/A;  200  ? ESOPHAGOGASTRODUODENOSCOPY N/A 04/22/2014  ? Procedure: ESOPHAGOGASTRODUODENOSCOPY (EGD);  Surgeon: Rogene Houston, MD;  Location: [facility];  Service: Endoscopy;  Laterality: N/A;  240  ? EYE SURGERY Bilateral yrs ago  ? ioc for cataract  ? MALONEY DILATION N/A 01/15/2014  ? Procedure: MALONEY DILATION;  Surgeon: Rogene Houston, MD;  Location: [facility];  Service: Endoscopy;  Laterality: N/A;  ? MALONEY DILATION  04/22/2014  ? Procedure: MALONEY DILATION;  Surgeon: Rogene Houston, MD;  Location: [facility];  Service: Endoscopy;;  ? surgery for left eye bleedf  2017  ? ? ?Social History  ? ?Socioeconomic History  ? Marital status: Married  ?  Spouse name: Not on file  ? Number of children: Not on file  ? Years of education: Not on file  ? Highest education level: Not on file  ?Occupational History  ? Not on file  ?Tobacco Use  ? Smoking status: Never  ? Smokeless tobacco: Never  ?Vaping Use  ?  Vaping Use: Never used  ?Substance and Sexual Activity  ? Alcohol use: No  ? Drug use: No  ? Sexual activity: Yes  ?Other Topics Concern  ? Not on file  ?Social History Narrative  ? Not on file  ? ?Social Determinants of Health  ? ?Financial Resource Strain: Not on file  ?Food Insecurity: Not on file  ?Transportation Needs: Not on file  ?Physical Activity: Not on file  ?Stress: Not on file  ?Social Connections: Not on file  ? ? ?Family History  ?Problem Relation Age of Onset  ? Diabetes Mother   ? Hypertension Mother   ? Hyperlipidemia Mother   ? Stroke Mother   ? Heart failure Mother   ? Diabetes Father   ? Heart disease Father   ? Hypertension Father   ? Hyperlipidemia Father   ? Heart attack Father   ? Diabetes Brother   ? Diabetes Brother   ? Diabetes Son   ? ? ?Outpatient Encounter Medications as of 07/07/2021  ?Medication Sig  ? benazepril (LOTENSIN)  20 MG tablet Take 1 tablet (20 mg total) by mouth daily.  ? blood glucose meter kit and supplies Dispense based on patient and insurance preference. Use up to three times daily as directed. (dx E11.65).  ? CALCIUM PO Take 1 tablet by mouth daily.  ? Cholecalciferol (VITAMIN D3 PO) Take 1 tablet by mouth daily.  ? Continuous Blood Gluc Receiver (FREESTYLE LIBRE 2 READER) DEVI 1 each by Does not apply route in the morning, at noon, in the evening, and at bedtime. Use to test BG qid  ? Continuous Blood Gluc Sensor (FREESTYLE LIBRE 2 SENSOR) MISC 1 each by Does not apply route every 14 (fourteen) days.  ? donepezil (ARICEPT) 10 MG tablet Take 1 tablet (10 mg total) by mouth at bedtime. (Patient taking differently: Take 10 mg by mouth daily.)  ? dorzolamide-timolol (COSOPT) 22.3-6.8 MG/ML ophthalmic solution Place 1 drop into both eyes 2 (two) times daily.  ? insulin degludec (TRESIBA FLEXTOUCH) 100 UNIT/ML FlexTouch Pen Inject 10 Units into the skin at bedtime.  ? Insulin Pen Needle (PEN NEEDLES) 31G X 6 MM MISC 1 each by Does not apply route at bedtime.  ? Iron, Ferrous Sulfate, 325 (65 Fe) MG TABS Take one tablet by mouth once daily (Patient taking differently: Take 325 mg by mouth daily.)  ? latanoprost (XALATAN) 0.005 % ophthalmic solution Place 1 drop into both eyes at bedtime.  ? methocarbamol (ROBAXIN) 500 MG tablet Take 1 tablet (500 mg total) by mouth every 6 (six) hours as needed for muscle spasms.  ? ONETOUCH VERIO test strip USE AS DIRECTED UP TO 4 TIMES DAILY  ? pantoprazole (PROTONIX) 40 MG tablet TAKE 1 TABLET BY MOUTH EVERY DAY  ? Probiotic Product (PROBIOTIC DAILY PO) Take 1 tablet by mouth daily.  ? rosuvastatin (CRESTOR) 5 MG tablet Take one tablet by mouth every Monday , Wednesday and Friday (Patient taking differently: Take 5 mg by mouth every Monday, Wednesday, and Friday.)  ? TRADJENTA 5 MG TABS tablet TAKE 1 TABLET BY MOUTH EVERY DAY (Patient taking differently: Take 5 mg by mouth daily.)  ? ?No  facility-administered encounter medications on file as of 07/07/2021.  ? ? ?ALLERGIES: ?No Known Allergies ? ? ?VACCINATION STATUS: ?Immunization History  ?Administered Date(s) Administered  ? Fluad Quad(high Dose 65+) 02/27/2019, 01/20/2021  ? Influenza,inj,Quad PF,6+ Mos 03/17/2013, 03/17/2014, 03/18/2015, 01/04/2016, 12/05/2016, 12/02/2019  ? Influenza-Unspecified 01/17/2017  ? Moderna Sars-Covid-2 Vaccination 06/23/2019, 07/21/2019, 02/12/2020  ?  PPD Test 08/16/2010, 08/18/2010, 11/03/2013  ? Pneumococcal Conjugate-13 11/03/2013  ? Pneumococcal Polysaccharide-23 12/21/2008  ? Td 12/21/2008  ? ? ?Diabetes ?He presents for his follow-up diabetic visit. He has type 2 diabetes mellitus. Onset time: He was diagnosed at approximate age of 23 years. His disease course has been improving. Hypoglycemia symptoms include nervousness/anxiousness, sweats and tremors. Pertinent negatives for hypoglycemia include no confusion, headaches, pallor or seizures. Associated symptoms include blurred vision and fatigue. Pertinent negatives for diabetes include no chest pain, no polydipsia, no polyphagia, no polyuria and no weakness. Hypoglycemia complications include nocturnal hypoglycemia. Symptoms are stable. Diabetic complications include impotence, nephropathy, peripheral neuropathy and PVD. Risk factors for coronary artery disease include diabetes mellitus, dyslipidemia, hypertension, male sex and sedentary lifestyle. Current diabetic treatment includes oral agent (monotherapy) and insulin injections. He is compliant with treatment most of the time. His weight is stable. He is following a generally healthy diet. When asked about meal planning, he reported none. He has not had a previous visit with a dietitian. He never participates in exercise. His home blood glucose trend is decreasing steadily. His breakfast blood glucose range is generally 70-90 mg/dl. (He presents today, accompanied by his sister, with his meter showing  frequent nocturnal/fasting hypoglycemia.  His POCT A1c today is 7%, improving from last visit of 8.5%.  His sister tells me he struggles to eat as he has no appetite and nothing tastes good anymore.  They have

## 2021-07-11 NOTE — Progress Notes (Signed)
Subjective: ?81 year old male presents the office today for follow-up evaluation of blister, wound to the back of his right heel.  States the area has become dry.  Does not see any swelling or redness or any drainage.  He does try to keep moisturizer on them.  No fevers or chills.  No other concerns.   ? ?Objective: ?AAO x3, NAD ?DP/PT pulses palpable bilaterally, CRT less than 3 seconds ?On the posterior aspect of the right heel at this dried hemorrhagic blister which is starting to callus over.  I was able to debride the majority of the dry, hemorrhagic tissue.  Upon debridement underlying skin intact.  There is no definitive skin breakdown there is no drainage or pus or any signs of infection.  No fluctuation or crepitation.  There is no malodor. ?No pain with calf compression, swelling, warmth, erythema ? ?Prior to debridement: ? ? ? ? ? ? ?Assessment: ?Preulcerative area right heel, hemorrhagic blister ? ?Plan: ?-All treatment options discussed with the patient including all alternatives, risks, complications.  ?-I was able to sharply debride the majority of the callus, dried hemorrhagic blister.  There is underlying healthy skin present.  Recommend moisturizer.  Continue offloading at all times.  Monitor for any signs or symptoms of infection. ?-Patient encouraged to call the office with any questions, concerns, change in symptoms.  ? ?Return in about 4 weeks (around 08/04/2021). ? ?Trula Slade DPM ? ?

## 2021-07-18 ENCOUNTER — Telehealth: Payer: Self-pay | Admitting: Nurse Practitioner

## 2021-07-18 NOTE — Telephone Encounter (Signed)
Patient's wife called and said ever since you took him off his insulin, his sugar has been high. She does not have the readings in front of her but someone would call back with those today ?

## 2021-07-19 NOTE — Telephone Encounter (Signed)
Called the son and spoke to the son and the patient and gave him the message. They both verbalized an understanding and will let us know if they have any questions. ?

## 2021-07-19 NOTE — Telephone Encounter (Signed)
Call to son, Ancil Boozer [phone] ?

## 2021-07-19 NOTE — Telephone Encounter (Signed)
Readings- His son said he is testing 1x a day ? ?Fri-219 ?Sat-379 ?Sun-219 ?Mon-287 ?

## 2021-07-19 NOTE — Telephone Encounter (Signed)
Has he started drinking those protein shakes we talked about?  I know he has a tendency not to eat, nothing tastes good to him.  Those protein drinks may be running his glucose up but it is better for him to have that nutrition.  He can restart his [place] but at 6 units SQ nightly for now.  We may need to make further adjustments but it is better for him to avoid that hypoglycemia like he had before.

## 2021-08-18 ENCOUNTER — Ambulatory Visit: Payer: Medicare HMO | Admitting: Podiatry

## 2021-08-30 ENCOUNTER — Encounter: Payer: Medicare HMO | Admitting: Family Medicine

## 2021-09-06 ENCOUNTER — Ambulatory Visit (INDEPENDENT_AMBULATORY_CARE_PROVIDER_SITE_OTHER): Payer: Medicare HMO | Admitting: Family Medicine

## 2021-09-06 ENCOUNTER — Encounter: Payer: Self-pay | Admitting: Family Medicine

## 2021-09-06 VITALS — BP 162/73 | HR 85 | Resp 16 | Ht 65.0 in | Wt 114.0 lb

## 2021-09-06 DIAGNOSIS — I10 Essential (primary) hypertension: Secondary | ICD-10-CM

## 2021-09-06 DIAGNOSIS — E782 Mixed hyperlipidemia: Secondary | ICD-10-CM

## 2021-09-06 DIAGNOSIS — Z0001 Encounter for general adult medical examination with abnormal findings: Secondary | ICD-10-CM | POA: Diagnosis not present

## 2021-09-06 MED ORDER — BENAZEPRIL HCL 20 MG PO TABS: 20.0000 mg | ORAL_TABLET | Freq: Every day | ORAL | 3 refills | Status: DC

## 2021-09-06 NOTE — Assessment & Plan Note (Signed)

## 2021-09-06 NOTE — Progress Notes (Unsigned)
   Jacob Barnes     MRN: [id number]      DOB: 1940-07-29   HPI: Patient is in for annual physical exam. Uncontrolled hypertension is addressed, unclear as to whether he is actually taking medications as prescribed Recent labs, if available are reviewed. Immunization is reviewed , and  needs to be updated, encouraged strongly to get the shingrix vaccines  PE; BP (!) 162/73   Pulse 85   Resp 16   Ht '5\' 5"'$  (1.651 m)   Wt 114 lb (51.7 kg)   SpO2 93%   BMI 18.97 kg/m   Pleasant male, alert and oriented x 3, in no cardio-pulmonary distress. Afebrile. HEENT No facial trauma or asymetry. Sinuses non tender. EOMI External ears normal,  Neck: supple, no adenopathy,JVD or thyromegaly.No bruits.  Chest: Clear to ascultation bilaterally.No crackles or wheezes. Non tender to palpation  Cardiovascular system; Heart sounds normal,  S1 and  S2 ,no S3.  No murmur, or thrill. Apical beat not displaced Peripheral pulses normal.  Abdomen: Soft, non tender, no organomegaly or masses. No bruits. Bowel sounds normal. No guarding, tenderness or rebound.    Musculoskeletal exam: Decreased though adequate  ROM of spine, hips , shoulders and knees.  deformity ,swelling  noted.in both hands and digits with significant muscle wasting No muscle wasting or atrophy.   Neurologic: Cranial nerves 2 to 12 intact. Power, tone ,sensation and reflexes normal throughout. No disturbance in gait. No tremor.  Skin: Intact, no ulceration, erythema , scaling or rash noted. Pigmentation normal throughout  Psych; Normal mood and affect. Judgement and concentration normal   Assessment & Plan:  Encounter for annual general medical examination with abnormal findings in adult Annual exam as documented. Counseling done  re healthy lifestyle involving commitment to 150 minutes exercise per week, heart healthy diet, and attaining healthy weight.The importance of adequate sleep also discussed. Regular seat  belt use and home safety, is also discussed. Changes in health habits are decided on by the patient with goals and time frames  set for achieving them. Immunization and cancer screening needs are specifically addressed at this visit.   Essential hypertension, benign Uncontrolled, compliance witrh  Medication is questionable as the bottle of medication he has was filled 1 year ago, states he pours from new to old when he gets meds. ister will monitor med ad min and he will return in 6 to 8 weeks for re eval DASH diet and commitment to daily physical activity for a minimum of 30 minutes discussed and encouraged, as a part of hypertension management. The importance of attaining a healthy weight is also discussed.     09/06/2021    2:59 PM 07/07/2021    1:58 PM 06/14/2021    2:40 PM 06/14/2021    1:44 PM 05/20/2021    3:04 PM 05/09/2021    8:42 AM 05/09/2021    4:11 AM  BP/Weight  Systolic BP 947 654 650 354 656 812 98  Diastolic BP 73 73 80 79 62 61 54  Wt. (Lbs) 114 119  117.12 118.04    BMI 18.97 kg/m2 19.8 kg/m2  19.49 kg/m2 19.64 kg/m2

## 2021-09-06 NOTE — Patient Instructions (Signed)
F/U July 11  early afternoon , cooordiate with Endo appt, hopefully 1 pm, re eval bP  Shingrix  vaccines please, NEEDED, start this week  IMPORTANT that you take your benazepril every day for blood pressure  Fasting lipid , cmp and EGFR 1 week before next visit  Pls  resend benazepril sent today new pharmacy [facility] in [place]   Thanks for choosing [facility], we consider it a privelige to serve you.

## 2021-09-07 ENCOUNTER — Encounter: Payer: Self-pay | Admitting: Family Medicine

## 2021-09-07 NOTE — Assessment & Plan Note (Signed)
Uncontrolled, compliance witrh  Medication is questionable as the bottle of medication he has was filled 1 year ago, states he pours from new to old when he gets meds. ister will monitor med ad min and he will return in 6 to 8 weeks for re eval DASH diet and commitment to daily physical activity for a minimum of 30 minutes discussed and encouraged, as a part of hypertension management. The importance of attaining a healthy weight is also discussed.     09/06/2021    2:59 PM 07/07/2021    1:58 PM 06/14/2021    2:40 PM 06/14/2021    1:44 PM 05/20/2021    3:04 PM 05/09/2021    8:42 AM 05/09/2021    4:11 AM  BP/Weight  Systolic BP 456 256 389 373 428 768 98  Diastolic BP 73 73 80 79 62 61 54  Wt. (Lbs) 114 119  117.12 118.04    BMI 18.97 kg/m2 19.8 kg/m2  19.49 kg/m2 19.64 kg/m2

## 2021-09-30 ENCOUNTER — Ambulatory Visit (INDEPENDENT_AMBULATORY_CARE_PROVIDER_SITE_OTHER): Payer: Medicare HMO | Admitting: Family Medicine

## 2021-09-30 ENCOUNTER — Encounter: Payer: Self-pay | Admitting: Family Medicine

## 2021-09-30 DIAGNOSIS — R197 Diarrhea, unspecified: Secondary | ICD-10-CM | POA: Diagnosis not present

## 2021-09-30 MED ORDER — DIPHENOXYLATE-ATROPINE 2.5-0.025 MG PO TABS: ORAL_TABLET | ORAL | 0 refills | Status: DC

## 2021-09-30 NOTE — Assessment & Plan Note (Signed)
4 month h/o frequency in stool and diarrhea, no associuated specific triggers, resulting in incontinence of stool which is bothersome. May be due to autonomic dysfunction from diabetes, may be IBS , diarrhea predominant Will RX lomotil up to 3 times per week and refer to GI

## 2021-10-05 ENCOUNTER — Encounter (INDEPENDENT_AMBULATORY_CARE_PROVIDER_SITE_OTHER): Payer: Self-pay | Admitting: *Deleted

## 2021-10-13 ENCOUNTER — Ambulatory Visit: Payer: Medicare HMO | Admitting: Family Medicine

## 2021-10-15 LAB — CMP14+EGFR
ALT: 9 IU/L (ref 0–44)
AST: 22 IU/L (ref 0–40)
Albumin/Globulin Ratio: 1 — ABNORMAL LOW (ref 1.2–2.2)
Albumin: 4 g/dL (ref 3.6–4.6)
Alkaline Phosphatase: 118 IU/L (ref 44–121)
BUN/Creatinine Ratio: 12 (ref 10–24)
BUN: 14 mg/dL (ref 8–27)
Bilirubin Total: 0.4 mg/dL (ref 0.0–1.2)
CO2: 25 mmol/L (ref 20–29)
Calcium: 8.9 mg/dL (ref 8.6–10.2)
Chloride: 100 mmol/L (ref 96–106)
Creatinine, Ser: 1.19 mg/dL (ref 0.76–1.27)
Globulin, Total: 4.1 g/dL (ref 1.5–4.5)
Glucose: 204 mg/dL — ABNORMAL HIGH (ref 70–99)
Potassium: 4.4 mmol/L (ref 3.5–5.2)
Sodium: 137 mmol/L (ref 134–144)
Total Protein: 8.1 g/dL (ref 6.0–8.5)
eGFR: 61 mL/min/{1.73_m2} (ref >59)

## 2021-10-15 LAB — LIPID PANEL
Chol/HDL Ratio: 2 ratio (ref 0.0–5.0)
Cholesterol, Total: 102 mg/dL (ref 100–199)
HDL: 51 mg/dL (ref >39)
LDL Chol Calc (NIH): 40 mg/dL (ref 0–99)
Triglycerides: 40 mg/dL (ref 0–149)
VLDL Cholesterol Cal: 11 mg/dL (ref 5–40)

## 2021-10-18 ENCOUNTER — Ambulatory Visit (INDEPENDENT_AMBULATORY_CARE_PROVIDER_SITE_OTHER): Payer: Medicare HMO | Admitting: Family Medicine

## 2021-10-18 ENCOUNTER — Encounter: Payer: Self-pay | Admitting: Family Medicine

## 2021-10-18 ENCOUNTER — Ambulatory Visit (INDEPENDENT_AMBULATORY_CARE_PROVIDER_SITE_OTHER): Payer: Medicare HMO | Admitting: Nurse Practitioner

## 2021-10-18 ENCOUNTER — Encounter: Payer: Self-pay | Admitting: Nurse Practitioner

## 2021-10-18 VITALS — BP 120/70 | HR 73 | Ht 64.0 in | Wt 112.0 lb

## 2021-10-18 VITALS — BP 102/61 | HR 73 | Ht 65.0 in | Wt 113.0 lb

## 2021-10-18 DIAGNOSIS — E785 Hyperlipidemia, unspecified: Secondary | ICD-10-CM

## 2021-10-18 DIAGNOSIS — E1159 Type 2 diabetes mellitus with other circulatory complications: Secondary | ICD-10-CM | POA: Diagnosis not present

## 2021-10-18 DIAGNOSIS — I10 Essential (primary) hypertension: Secondary | ICD-10-CM

## 2021-10-18 DIAGNOSIS — E782 Mixed hyperlipidemia: Secondary | ICD-10-CM | POA: Diagnosis not present

## 2021-10-18 DIAGNOSIS — E1165 Type 2 diabetes mellitus with hyperglycemia: Secondary | ICD-10-CM | POA: Diagnosis not present

## 2021-10-18 DIAGNOSIS — M8580 Other specified disorders of bone density and structure, unspecified site: Secondary | ICD-10-CM

## 2021-10-18 LAB — POCT GLYCOSYLATED HEMOGLOBIN (HGB A1C): HbA1c POC (<> result, manual entry): 9.4 % (ref 4.0–5.6)

## 2021-10-18 MED ORDER — TRESIBA FLEXTOUCH 100 UNIT/ML ~~LOC~~ SOPN
5.0000 [IU] | PEN_INJECTOR | Freq: Every day | SUBCUTANEOUS | 3 refills | Status: DC
Start: 2021-10-18 — End: 2021-11-18

## 2021-10-18 NOTE — Patient Instructions (Addendum)
F/U in November, call if you need me sooner, flu vaccine at visit  Blood pressure and cholesterol are excellent , no med change  Thanks for choosing [facility], we consider it a privelige to serve you.

## 2021-10-18 NOTE — Progress Notes (Signed)
Endocrinology Follow Up Visit       10/18/2021, 2:27 PM   Subjective:    Patient ID: Jacob Barnes, male    DOB: 12-25-1940.  Jacob Barnes is being seen in follow up after being seen in consultation for management of currently uncontrolled symptomatic diabetes requested by  Fayrene Helper, MD.   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   Anemia    Arthritis    hands   Carpal tunnel syndrome 08/05/2009   Qualifier: Diagnosis of  By: Aline Brochure MD, [name]     Chronic kidney disease    Diabetes mellitus    says since 1979 type 2   Diabetes mellitus without complication ([place])    Phreesia 04/03/2020   ERECTILE DYSFUNCTION, ORGANIC 01/20/2009   Qualifier: Diagnosis of  By: Moshe Cipro MD, Margaret     GERD (gastroesophageal reflux disease)    Glaucoma    both eyes   History of kidney stones    Hyperlipemia    Hypertension    Iron deficiency anemia 01/10/2014   Other pancytopenia ([place]) 01/10/2014   New in 01/2014, undergoing pathology review    Rash    front abdomen no drainage    Past Surgical History:  Procedure Laterality Date   AMPUTATION Right 09/01/2016   Procedure: AMPUTATION RIGHT MIDDLE FINGER TIP;  Surgeon: Mcarthur Rossetti, MD;  Location: [facility];  Service: Orthopedics;  Laterality: Right;   AMPUTATION Right 10/10/2016   Procedure: REPEAT IRRIGATION AND DEBRIDEMENT RIGHT MIDDLE FINGER WITH AMPUTATION THROUGH PROXIMAL PHALANX;  Surgeon: Mcarthur Rossetti, MD;  Location: [facility];  Service: Orthopedics;  Laterality: Right;   APPENDECTOMY     COLONOSCOPY  09/06/2011   Procedure: COLONOSCOPY;  Surgeon: Rogene Houston, MD;  Location: [facility];  Service: Endoscopy;  Laterality: N/A;  830   CYSTOSCOPY/RETROGRADE/URETEROSCOPY/STONE EXTRACTION WITH BASKET     ESOPHAGOGASTRODUODENOSCOPY N/A  01/15/2014   Procedure: ESOPHAGOGASTRODUODENOSCOPY (EGD);  Surgeon: Rogene Houston, MD;  [facility]: [facility];  Service: Endoscopy;  Laterality: N/A;  200   ESOPHAGOGASTRODUODENOSCOPY N/A 04/22/2014   Procedure: ESOPHAGOGASTRODUODENOSCOPY (EGD);  Surgeon: Rogene Houston, MD;  [facility]: [facility];  Service: Endoscopy;  Laterality: N/A;  240   EYE SURGERY Bilateral yrs ago   ioc for cataract   MALONEY DILATION N/A 01/15/2014   Procedure: MALONEY DILATION;  Surgeon: Rogene Houston, MD;  [facility];  Service: Endoscopy;  Laterality: N/A;   MALONEY DILATION  04/22/2014   Procedure: Venia Minks DILATION;  Surgeon: Rogene Houston, MD;  Location: [facility];  Service: Endoscopy;;   surgery for left eye bleedf  2017    Social History   Socioeconomic History   Marital status: Married    Spouse name: Not on file   Number of children: Not on file   Years of education: Not on file   Highest education level: Not on file  Occupational History   Not on file  Tobacco Use   Smoking status: Never   Smokeless tobacco: Never  Vaping Use  Vaping Use: Never used  Substance and Sexual Activity   Alcohol use: No   Drug use: No   Sexual activity: Yes  Other Topics Concern   Not on file  Social History Narrative   Not on file   Social Determinants of Health   Financial Resource Strain: Low Risk  (04/06/2020)   Overall Financial Resource Strain (CARDIA)    Difficulty of Paying Living Expenses: Not hard at all  Food Insecurity: No Food Insecurity (04/06/2020)   Hunger Vital Sign    Worried About Running Out of Food in the Last Year: Never true    Ran Out of Food in the Last Year: Never true  Transportation Needs: No Transportation Needs (04/06/2020)   PRAPARE - Hydrologist (Medical): No    Lack of Transportation (Non-Medical): No  Physical Activity: Insufficiently Active (04/06/2020)   Exercise Vital Sign    Days of Exercise per Week: 5 days     Minutes of Exercise per Session: 10 min  Stress: No Stress Concern Present (04/06/2020)   Media    Feeling of Stress : Not at all  Social Connections: Moderately Isolated (04/06/2020)   Social Connection and Isolation Panel [NHANES]    Frequency of Communication with Friends and Family: More than three times a week    Frequency of Social Gatherings with Friends and Family: More than three times a week    Attends Religious Services: Never    Marine scientist or Organizations: No    Attends Music therapist: Never    Marital Status: Married    Family History  Problem Relation Age of Onset   Diabetes Mother    Hypertension Mother    Hyperlipidemia Mother    Stroke Mother    Heart failure Mother    Diabetes Father    Heart disease Father    Hypertension Father    Hyperlipidemia Father    Heart attack Father    Diabetes Brother    Diabetes Brother    Diabetes Son     Outpatient Encounter Medications as of 10/18/2021  Medication Sig   benazepril (LOTENSIN) 20 MG tablet Take 1 tablet (20 mg total) by mouth daily.   blood glucose meter kit and supplies Dispense based on patient and insurance preference. Use up to three times daily as directed. (dx E11.65).   CALCIUM PO Take 1 tablet by mouth daily.   Cholecalciferol (VITAMIN D3 PO) Take 1 tablet by mouth daily.   diphenoxylate-atropine (LOMOTIL) 2.5-0.025 MG tablet Take one tablet by mout htree times weekly as needed, for loose stool   donepezil (ARICEPT) 10 MG tablet Take 1 tablet (10 mg total) by mouth at bedtime. (Patient taking differently: Take 10 mg by mouth daily.)   dorzolamide-timolol (COSOPT) 22.3-6.8 MG/ML ophthalmic solution Place 1 drop into both eyes 2 (two) times daily.   insulin degludec (TRESIBA FLEXTOUCH) 100 UNIT/ML FlexTouch Pen Inject 5 Units into the skin at bedtime.   Insulin Pen Needle (PEN NEEDLES) 31G X 6 MM MISC 1 each  by Does not apply route at bedtime.   Iron, Ferrous Sulfate, 325 (65 Fe) MG TABS Take one tablet by mouth once daily (Patient taking differently: Take 325 mg by mouth daily.)   latanoprost (XALATAN) 0.005 % ophthalmic solution Place 1 drop into both eyes at bedtime.   ONETOUCH VERIO test strip USE AS DIRECTED UP TO 4 TIMES DAILY   pantoprazole (  PROTONIX) 40 MG tablet TAKE 1 TABLET BY MOUTH EVERY DAY   Probiotic Product (PROBIOTIC DAILY PO) Take 1 tablet by mouth daily.   rosuvastatin (CRESTOR) 5 MG tablet Take one tablet by mouth every Monday , Wednesday and Friday (Patient taking differently: Take 5 mg by mouth every Monday, Wednesday, and Friday.)   TRADJENTA 5 MG TABS tablet TAKE 1 TABLET BY MOUTH EVERY DAY (Patient taking differently: Take 5 mg by mouth daily.)   [DISCONTINUED] Continuous Blood Gluc Receiver (FREESTYLE LIBRE 2 READER) DEVI 1 each by Does not apply route in the morning, at noon, in the evening, and at bedtime. Use to test BG qid   [DISCONTINUED] Continuous Blood Gluc Sensor (FREESTYLE LIBRE 2 SENSOR) MISC 1 each by Does not apply route every 14 (fourteen) days.   [DISCONTINUED] insulin degludec (TRESIBA FLEXTOUCH) 100 UNIT/ML FlexTouch Pen Inject 10 Units into the skin at bedtime. (Patient taking differently: Inject 2-4 Units into the skin at bedtime.)   [DISCONTINUED] methocarbamol (ROBAXIN) 500 MG tablet Take 1 tablet (500 mg total) by mouth every 6 (six) hours as needed for muscle spasms.   No facility-administered encounter medications on file as of 10/18/2021.    ALLERGIES: No Known Allergies   VACCINATION STATUS: Immunization History  Administered Date(s) Administered   Fluad Quad(high Dose 65+) 02/27/2019, 01/20/2021   Influenza,inj,Quad PF,6+ Mos 03/17/2013, 03/17/2014, 03/18/2015, 01/04/2016, 12/05/2016, 12/02/2019   Influenza-Unspecified 01/17/2017   Moderna Sars-Covid-2 Vaccination 06/23/2019, 07/21/2019, 02/12/2020   PPD Test 08/16/2010, 08/18/2010,  11/03/2013   Pneumococcal Conjugate-13 11/03/2013   Pneumococcal Polysaccharide-23 12/21/2008   Td 12/21/2008    Diabetes He presents for his follow-up diabetic visit. He has type 2 diabetes mellitus. Onset time: He was diagnosed at approximate age of 8 years. His disease course has been worsening. Hypoglycemia symptoms include nervousness/anxiousness, sweats and tremors. Pertinent negatives for hypoglycemia include no confusion, headaches, pallor or seizures. Associated symptoms include blurred vision, fatigue and weight loss. Pertinent negatives for diabetes include no chest pain, no polydipsia, no polyphagia, no polyuria and no weakness. Hypoglycemia complications include nocturnal hypoglycemia. Symptoms are stable. Diabetic complications include impotence, nephropathy, peripheral neuropathy and PVD. Risk factors for coronary artery disease include diabetes mellitus, dyslipidemia, hypertension, male sex and sedentary lifestyle. Current diabetic treatment includes oral agent (monotherapy) and insulin injections. He is compliant with treatment most of the time. His weight is decreasing steadily. He is following a generally healthy diet. When asked about meal planning, he reported none. He has not had a previous visit with a dietitian. He never participates in exercise. His home blood glucose trend is fluctuating dramatically. His overall blood glucose range is >200 mg/dl. (He presents today, accompanied by his sister, with his meter showing fluctuating glycemic profile with hyperglycemia overall.  His POCT A1c today is 9.4%, increasing from last visit of 7%.  He lowered his dose of Tresiba to 2 units nightly due to nocturnal hypoglycemia.  Analysis of his meter shows 7-day average of 220, 14-day average of 205, 30-day average of 153, and 90-day average of 181.  He still struggles with eating as he does not have an appetite.  He has follow up with GI coming up to discuss chronic diarrhea.) An ACE  inhibitor/angiotensin II receptor blocker is being taken. He does not see a podiatrist.Eye exam is current.  Hyperlipidemia This is a chronic problem. The current episode started more than 1 year ago. The problem is controlled. Recent lipid tests were reviewed and are normal. Exacerbating diseases include chronic renal disease  and diabetes. There are no known factors aggravating his hyperlipidemia. Pertinent negatives include no chest pain, myalgias or shortness of breath. Current antihyperlipidemic treatment includes statins. The current treatment provides moderate improvement of lipids. Compliance problems include adherence to diet.  Risk factors for coronary artery disease include dyslipidemia, diabetes mellitus, hypertension, male sex, a sedentary lifestyle and family history.  Hypertension This is a chronic problem. The current episode started more than 1 year ago. The problem has been resolved since onset. The problem is controlled. Associated symptoms include blurred vision and sweats. Pertinent negatives include no chest pain, headaches, neck pain, palpitations or shortness of breath. There are no associated agents to hypertension. Risk factors for coronary artery disease include diabetes mellitus, dyslipidemia, male gender and sedentary lifestyle. Past treatments include ACE inhibitors. The current treatment provides moderate improvement. There are no compliance problems.  Hypertensive end-organ damage includes kidney disease and PVD. Identifiable causes of hypertension include chronic renal disease.      Review of systems  Constitutional: + Minimally fluctuating body weight,  current Body mass index is 18.8 kg/m. , no fatigue, no subjective hyperthermia, no subjective hypothermia, + decreased appetite Eyes: no blurry vision, no xerophthalmia ENT: no sore throat, no nodules palpated in throat, no dysphagia/odynophagia, no hoarseness Cardiovascular: no chest pain, no shortness of breath, no  palpitations, no leg swelling Respiratory: no cough, no shortness of breath Gastrointestinal: no nausea/vomiting, + chronic diarrhea Musculoskeletal: no muscle/joint aches, reports multiple falls Skin: no rashes, no hyperemia Neurological: no tremors, no numbness, no tingling, no dizziness Psychiatric: no depression, no anxiety    Objective:    BP 102/61   Pulse 73   Ht '5\' 5"'  (1.651 m)   Wt 113 lb (51.3 kg)   BMI 18.80 kg/m   Wt Readings from Last 3 Encounters:  10/18/21 113 lb (51.3 kg)  09/06/21 114 lb (51.7 kg)  07/07/21 119 lb (54 kg)    BP Readings from Last 3 Encounters:  10/18/21 102/61  09/06/21 (!) 162/73  07/07/21 138/73     Physical Exam- Limited  Constitutional:  Body mass index is 18.8 kg/m. , not in acute distress, normal state of mind Eyes:  EOMI, no exophthalmos Neck: Supple Cardiovascular: RRR, no murmurs, rubs, or gallops, no edema Respiratory: Adequate breathing efforts, no crackles, rales, rhonchi, or wheezing Musculoskeletal: missing tips of some fingers, strength intact in all four extremities, no gross restriction of joint movements Skin:  no rashes, no hyperemia Neurological: no tremor with outstretched hands   CMP ( most recent) CMP     Component Value Date/Time   NA 137 10/14/2021 0949   K 4.4 10/14/2021 0949   CL 100 10/14/2021 0949   CO2 25 10/14/2021 0949   GLUCOSE 204 (H) 10/14/2021 0949   GLUCOSE 193 (H) 05/08/2021 0313   BUN 14 10/14/2021 0949   CREATININE 1.19 10/14/2021 0949   CREATININE 1.36 (H) 09/30/2019 1008   CALCIUM 8.9 10/14/2021 0949   PROT 8.1 10/14/2021 0949   ALBUMIN 4.0 10/14/2021 0949   AST 22 10/14/2021 0949   ALT 9 10/14/2021 0949   ALKPHOS 118 10/14/2021 0949   BILITOT 0.4 10/14/2021 0949   GFRNONAA >60 05/08/2021 0313   GFRNONAA 49 (L) 09/30/2019 1008   GFRAA 59 (L) 03/31/2020 0848   GFRAA 57 (L) 09/30/2019 1008     Diabetic Labs (most recent): Lab Results  Component Value Date   HGBA1C 9.4  10/18/2021   HGBA1C 7.0 07/07/2021   HGBA1C 8.5 (H) 03/29/2021  MICROALBUR 150 07/14/2020   MICROALBUR 8.5 09/23/2018   MICROALBUR 127.6 (H) 06/20/2018     Lipid Panel ( most recent) Lipid Panel     Component Value Date/Time   CHOL 102 10/14/2021 0949   TRIG 40 10/14/2021 0949   HDL 51 10/14/2021 0949   CHOLHDL 2.0 10/14/2021 0949   CHOLHDL 2.6 09/30/2019 1008   VLDL 7 11/27/2016 1000   LDLCALC 40 10/14/2021 0949   LDLCALC 58 09/30/2019 1008   LABVLDL 11 10/14/2021 0949      Lab Results  Component Value Date   TSH 0.905 03/29/2021   TSH 1.300 03/31/2020   TSH 1.77 09/23/2018   TSH 0.61 07/19/2015   TSH 0.952 03/18/2015   TSH 0.921 12/11/2013   TSH 1.392 10/29/2012   TSH 0.615 09/26/2011   TSH 1.573 12/21/2008   FREET4 1.26 03/31/2020      Assessment & Plan:   1) Uncontrolled type 2 diabetes mellitus with hyperglycemia (HCC)  - Trip JUVENAL UMAR has currently uncontrolled symptomatic type 2 DM since  81 years of age.  He presents today, accompanied by his sister, with his meter showing fluctuating glycemic profile with hyperglycemia overall.  His POCT A1c today is 9.4%, increasing from last visit of 7%.  He lowered his dose of Tresiba to 2 units nightly due to nocturnal hypoglycemia.  Analysis of his meter shows 7-day average of 220, 14-day average of 205, 30-day average of 153, and 90-day average of 181.  He still struggles with eating as he does not have an appetite.  He has follow up with GI coming up to discuss chronic diarrhea.   Recent labs reviewed.  - I had a long discussion with him about the progressive nature of diabetes and the pathology behind its complications. -his diabetes is complicated by peripheral arterial disease and he remains at a high risk for more acute and chronic complications which include CAD, CVA, CKD, retinopathy, and neuropathy. These are all discussed in detail with him.  - Nutritional counseling repeated at each appointment due to  patients tendency to fall back in to old habits.  - The patient admits there is a room for improvement in their diet and drink choices. -  Suggestion is made for the patient to avoid simple carbohydrates from their diet including Cakes, Sweet Desserts / Pastries, Ice Cream, Soda (diet and regular), Sweet Tea, Candies, Chips, Cookies, Sweet Pastries, Store Bought Juices, Alcohol in Excess of 1-2 drinks a day, Artificial Sweeteners, Coffee Creamer, and "Sugar-free" Products. This will help patient to have stable blood glucose profile and potentially avoid unintended weight gain.   - I encouraged the patient to switch to unprocessed or minimally processed complex starch and increased protein intake (animal or plant source), fruits, and vegetables.   - Patient is advised to stick to a routine mealtimes to eat 3 meals a day and avoid unnecessary snacks (to snack only to correct hypoglycemia).  -I did give some suggestions for plant based protein shakes as the whey protein in the products he has tried is known to cause some GI distress if sensitive to lactose.  - I have approached him with the following individualized plan to manage  his diabetes and patient agrees:   -In light of his advanced age and light body weight, avoiding hypoglycemia should be the number 1 priority in his care.   -He is advised to adjust his Tyler Aas to 5 units SQ nightly and continue his Tradjenta 5 mg po daily.    -  He is encouraged to continue monitoring blood glucose at least twice daily, before breakfast and before bed, and to call the clinic if he has readings less than 70 or above 300 for 3 tests in a row.  He could benefit from a CGM, was previously denied.  Will try again.  Sent for Dexcom G7 through [employer].  - he is not a candidate for Metformin due to concurrent renal insufficiency.  - he is not a candidate for incretin therapy due to his body habitus.   - Specific targets for  A1c;  LDL, HDL,  and Triglycerides  were discussed with the patient.  2) Blood Pressure /Hypertension:   his blood pressure is controlled to target for his age.   he is advised to continue his current medications including Benazepril 10 mg p.o. daily with breakfast .  3) Lipids/Hyperlipidemia:    His most recent lipid panel from 06/14/21 shows controlled LDL at 26.  He is advised to continue Crestor 5 mg po daily at bedtime.  Side effects and precautions discussed with him.  4)  Weight/Diet:  His Body mass index is 18.8 kg/m.   he is not a candidate for weight loss.   Exercise, and detailed carbohydrates information provided  -  detailed on discharge instructions.  5) Osteopenia: -He will not need bisphosphonate intervention at this time.  He is advised to continue on his Vitamin D 4000 units daily, as well as Calcium carbonate 600 mg p.o. daily.  6) Chronic Care/Health Maintenance: -he is on ACEI/ARB and Statin medications and is encouraged to initiate and continue to follow up with Ophthalmology, Dentist, Podiatrist at least yearly or according to recommendations, and advised to stay away from smoking. I have recommended yearly flu vaccine and pneumonia vaccine at least every 5 years; moderate intensity exercise for up to 150 minutes weekly; and  sleep for at least 7 hours a day.  - he is advised to maintain close follow up with Fayrene Helper, MD for primary care needs, as well as his other providers for optimal and coordinated care.     I spent 33 minutes in the care of the patient today including review of labs from [facility], Lipids, Thyroid Function, Hematology (current and previous including abstractions from other facilities); face-to-face time discussing  his blood glucose readings/logs, discussing hypoglycemia and hyperglycemia episodes and symptoms, medications doses, his options of short and long term treatment based on the latest standards of care / guidelines;  discussion about incorporating lifestyle medicine;  and  documenting the encounter. Risk reduction counseling performed per USPSTF guidelines to reduce obesity and cardiovascular risk factors.     Please refer to Patient Instructions for Blood Glucose Monitoring and Insulin/Medications Dosing Guide"  in media tab for additional information. Please  also refer to " Patient Self Inventory" in the Media  tab for reviewed elements of pertinent patient history.  Rhett Bannister participated in the discussions, expressed understanding, and voiced agreement with the above plans.  All questions were answered to his satisfaction. he is encouraged to contact clinic should he have any questions or concerns prior to his return visit.   Follow up plan: - Return in about 3 months (around 01/18/2022) for Diabetes F/U with A1c in office, No previsit labs, Bring meter and logs.  Rayetta Pigg, [facility] [address] [place], [place] [address] Phone: [phone] Fax: [phone]  10/18/2021, 2:27 PM

## 2021-10-24 ENCOUNTER — Encounter: Payer: Self-pay | Admitting: Family Medicine

## 2021-10-24 DIAGNOSIS — E785 Hyperlipidemia, unspecified: Secondary | ICD-10-CM | POA: Insufficient documentation

## 2021-10-24 NOTE — Assessment & Plan Note (Signed)
Jacob Barnes is reminded of the importance of commitment to daily physical activity for 30 minutes or more, as able and the need to limit carbohydrate intake to 30 to 60 grams per meal to help with blood sugar control.   The need to take medication as prescribed, test blood sugar as directed, and to call between visits if there is a concern that blood sugar is uncontrolled is also discussed.   Jacob Barnes is reminded of the importance of daily foot exam, annual eye examination, and good blood sugar, blood pressure and cholesterol control.     Latest Ref Rng & Units 10/18/2021    2:13 PM 10/14/2021    9:49 AM 07/07/2021    2:06 PM 06/14/2021    2:54 PM 05/08/2021    3:13 AM  Diabetic Labs  HbA1c 4.0 - 5.6 % 9.4   7.0     Micro/Creat Ratio 0 - 29 mg/g creat    216    Chol 100 - 199 mg/dL  102   90    HDL >39 mg/dL  51   52    Calc LDL 0 - 99 mg/dL  40   26    Triglycerides 0 - 149 mg/dL  40   46    Creatinine 0.76 - 1.27 mg/dL  1.19   1.38  1.17       10/18/2021    4:24 PM 10/18/2021    3:26 PM 10/18/2021    1:59 PM 09/06/2021    2:59 PM 07/07/2021    1:58 PM 06/14/2021    2:40 PM 06/14/2021    1:44 PM  BP/Weight  Systolic BP 250 539 767 341 937 902 409  Diastolic BP 70 63 61 73 73 80 79  Wt. (Lbs)  112.04 113 114 119  117.12  BMI  19.23 kg/m2 18.8 kg/m2 18.97 kg/m2 19.8 kg/m2  19.49 kg/m2      02/27/2019    9:00 AM 01/21/2018    1:40 PM  Foot/eye exam completion dates  Foot Form Completion Done Done   Uncontrolled, managed by Endo, importance of sticking with diet and meds is stressed

## 2021-10-24 NOTE — Progress Notes (Signed)
Jacob Barnes     MRN: [id number]      DOB: 23-Oct-1940   HPI Jacob Barnes is here for follow up and re-evaluation of chronic medical conditions, medication management and review of any available recent lab and radiology data.  Preventive health is updated, specifically  Cancer screening and Immunization.   Questions or concerns regarding consultations or procedures which the PT has had in the interim are  addressed. The PT denies any adverse reactions to current medications since the last visit.  There are no new concerns.  There are no specific complaints   ROS Denies recent fever or chills. Denies sinus pressure, nasal congestion, ear pain or sore throat. Denies chest congestion, productive cough or wheezing. Denies chest pains, palpitations and leg swelling Denies abdominal pain, nausea, vomiting,diarrhea or constipation.   Denies dysuria, frequency, hesitancy or incontinence. Denies joint pain, swelling and limitation in mobility. Denies headaches, seizures, numbness, or tingling. Denies depression, anxiety or insomnia. Denies skin break down or rash.   PE  BP 120/70   Pulse 73   Ht '5\' 4"'$  (1.626 m)   Wt 112 lb 0.6 oz (50.8 kg)   SpO2 96%   BMI 19.23 kg/m   Patient alert and oriented and in no cardiopulmonary distress.  HEENT: No facial asymmetry, EOMI,     Neck supple .  Chest: Clear to auscultation bilaterally.  CVS: S1, S2 no murmurs, no S3.Regular rate.  ABD: Soft non tender.   Ext: No edema  MS: decreased  ROM spine, shoulders, hips and knees.  Skin: Intact, no ulcerations or rash noted.  Psych: Good eye contact, normal affect. Memory intact not anxious or depressed appearing.  CNS: CN 2-12 intact, power,  normal throughout.no focal deficits noted.   Assessment & Plan  Essential hypertension, benign Controlled, no change in medication DASH diet and commitment to daily physical activity for a minimum of 30 minutes discussed and encouraged, as a part of  hypertension management. The importance of attaining a healthy weight is also discussed.     10/18/2021    4:24 PM 10/18/2021    3:26 PM 10/18/2021    1:59 PM 09/06/2021    2:59 PM 07/07/2021    1:58 PM 06/14/2021    2:40 PM 06/14/2021    1:44 PM  BP/Weight  Systolic BP 235 361 443 154 008 676 195  Diastolic BP 70 63 61 73 73 80 79  Wt. (Lbs)  112.04 113 114 119  117.12  BMI  19.23 kg/m2 18.8 kg/m2 18.97 kg/m2 19.8 kg/m2  19.49 kg/m2       Type 2 diabetes mellitus with vascular disease [facility]) Jacob Barnes is reminded of the importance of commitment to daily physical activity for 30 minutes or more, as able and the need to limit carbohydrate intake to 30 to 60 grams per meal to help with blood sugar control.   The need to take medication as prescribed, test blood sugar as directed, and to call between visits if there is a concern that blood sugar is uncontrolled is also discussed.   Jacob Barnes is reminded of the importance of daily foot exam, annual eye examination, and good blood sugar, blood pressure and cholesterol control.     Latest Ref Rng & Units 10/18/2021    2:13 PM 10/14/2021    9:49 AM 07/07/2021    2:06 PM 06/14/2021    2:54 PM 05/08/2021    3:13 AM  Diabetic Labs  HbA1c 4.0 - 5.6 %  9.4   7.0     Micro/Creat Ratio 0 - 29 mg/g creat    216    Chol 100 - 199 mg/dL  102   90    HDL >39 mg/dL  51   52    Calc LDL 0 - 99 mg/dL  40   26    Triglycerides 0 - 149 mg/dL  40   46    Creatinine 0.76 - 1.27 mg/dL  1.19   1.38  1.17       10/18/2021    4:24 PM 10/18/2021    3:26 PM 10/18/2021    1:59 PM 09/06/2021    2:59 PM 07/07/2021    1:58 PM 06/14/2021    2:40 PM 06/14/2021    1:44 PM  BP/Weight  Systolic BP 157 262 035 597 416 384 536  Diastolic BP 70 63 61 73 73 80 79  Wt. (Lbs)  112.04 113 114 119  117.12  BMI  19.23 kg/m2 18.8 kg/m2 18.97 kg/m2 19.8 kg/m2  19.49 kg/m2      02/27/2019    9:00 AM 01/21/2018    1:40 PM  Foot/eye exam completion dates  Foot Form Completion Done  Done   Uncontrolled, managed by Endo, importance of sticking with diet and meds is stressed     Dyslipidemia Hyperlipidemia:Low fat diet discussed and encouraged.   Lipid Panel  Lab Results  Component Value Date   CHOL 102 10/14/2021   HDL 51 10/14/2021   LDLCALC 40 10/14/2021   TRIG 40 10/14/2021   CHOLHDL 2.0 10/14/2021     Controlled, no change in medication

## 2021-10-24 NOTE — Assessment & Plan Note (Signed)
Hyperlipidemia:Low fat diet discussed and encouraged.   Lipid Panel  Lab Results  Component Value Date   CHOL 102 10/14/2021   HDL 51 10/14/2021   LDLCALC 40 10/14/2021   TRIG 40 10/14/2021   CHOLHDL 2.0 10/14/2021     Controlled, no change in medication

## 2021-10-24 NOTE — Assessment & Plan Note (Signed)
Controlled, no change in medication DASH diet and commitment to daily physical activity for a minimum of 30 minutes discussed and encouraged, as a part of hypertension management. The importance of attaining a healthy weight is also discussed.     10/18/2021    4:24 PM 10/18/2021    3:26 PM 10/18/2021    1:59 PM 09/06/2021    2:59 PM 07/07/2021    1:58 PM 06/14/2021    2:40 PM 06/14/2021    1:44 PM  BP/Weight  Systolic BP 354 562 563 893 734 287 681  Diastolic BP 70 63 61 73 73 80 79  Wt. (Lbs)  112.04 113 114 119  117.12  BMI  19.23 kg/m2 18.8 kg/m2 18.97 kg/m2 19.8 kg/m2  19.49 kg/m2

## 2021-11-18 ENCOUNTER — Telehealth: Payer: Self-pay | Admitting: Nurse Practitioner

## 2021-11-18 MED ORDER — TRESIBA FLEXTOUCH 100 UNIT/ML ~~LOC~~ SOPN
5.0000 [IU] | PEN_INJECTOR | Freq: Every day | SUBCUTANEOUS | 3 refills | Status: DC
Start: 2021-11-18 — End: 2021-11-18

## 2021-11-18 MED ORDER — TRESIBA FLEXTOUCH 100 UNIT/ML ~~LOC~~ SOPN
5.0000 [IU] | PEN_INJECTOR | Freq: Every day | SUBCUTANEOUS | 3 refills | Status: DC
Start: 2021-11-18 — End: 2022-01-26

## 2021-11-18 MED ORDER — PEN NEEDLES 31G X 6 MM MISC: 1.0000 | Freq: Every day | 2 refills | Status: AC

## 2021-11-18 MED ORDER — PEN NEEDLES 31G X 6 MM MISC
1.0000 | Freq: Every day | 2 refills | Status: DC
Start: 2021-11-18 — End: 2021-11-18

## 2021-11-18 NOTE — Addendum Note (Signed)
Addended by: Brita Romp on: 11/18/2021 10:20 AM   Modules accepted: Orders

## 2021-11-18 NOTE — Telephone Encounter (Signed)
Spoke with pharmacist. He states he was getting it ready when I called.

## 2021-11-18 NOTE — Telephone Encounter (Signed)
done

## 2021-11-18 NOTE — Telephone Encounter (Signed)
New message   Daughter wondering why she receive at text saying the prescription was too soon to refill   Patient is out of his medication    1. Which medications need to be refilled? (please list name of each medication and dose if known) insulin degludec (TRESIBA FLEXTOUCH) 100 UNIT/ML FlexTouch Pen  2. Which pharmacy/location (including street and city if local pharmacy) is medication to be sent to?[facility] [place]   3. Do they need a 30 day or 90 day supply? 30 days

## 2021-11-18 NOTE — Telephone Encounter (Signed)
Patient's daughter called and said he is out of [place] and needs a [facility] [place]. The last script says " no print " so I do not think it went to the pharmacy. Thank you

## 2021-11-23 ENCOUNTER — Ambulatory Visit: Payer: Medicare HMO

## 2021-12-01 ENCOUNTER — Ambulatory Visit (INDEPENDENT_AMBULATORY_CARE_PROVIDER_SITE_OTHER): Payer: Medicare HMO | Admitting: Gastroenterology

## 2021-12-01 ENCOUNTER — Ambulatory Visit (INDEPENDENT_AMBULATORY_CARE_PROVIDER_SITE_OTHER): Payer: Medicare HMO

## 2021-12-01 ENCOUNTER — Encounter (INDEPENDENT_AMBULATORY_CARE_PROVIDER_SITE_OTHER): Payer: Self-pay | Admitting: Gastroenterology

## 2021-12-01 VITALS — BP 118/67 | HR 69 | Temp 98.0°F | Ht 64.0 in | Wt 110.3 lb

## 2021-12-01 DIAGNOSIS — D649 Anemia, unspecified: Secondary | ICD-10-CM | POA: Diagnosis not present

## 2021-12-01 DIAGNOSIS — R634 Abnormal weight loss: Secondary | ICD-10-CM | POA: Diagnosis not present

## 2021-12-01 DIAGNOSIS — E1159 Type 2 diabetes mellitus with other circulatory complications: Secondary | ICD-10-CM

## 2021-12-01 DIAGNOSIS — R197 Diarrhea, unspecified: Secondary | ICD-10-CM | POA: Diagnosis not present

## 2021-12-01 NOTE — Progress Notes (Signed)
Pt brings in his new Freestyle Libre 2 today. Went over usage instructions. Placed sensor on L arm. Pts wife was in the room as well. They voiced upstanding of Libre 2 usage and sensor placement.

## 2021-12-01 NOTE — Patient Instructions (Signed)
Pt told to call with any questions/concerns.

## 2021-12-01 NOTE — Progress Notes (Signed)
Referring Provider: Fayrene Helper, MD Primary Care Physician:  Fayrene Helper, MD Primary GI Physician: new  Chief Complaint  Patient presents with   Diarrhea    New patient. Arrives with sister Devern. Referred for diarrhea. Has had for over a year. Imodium and pepto does help. Has diarrhea most of the time after eating and especially after eating meat.    HPI:   Jacob Barnes is a 81 y.o. male with past medical history of anemia, arthritis, carpal tunnel, DM, ED, GERD, glaucoma, HLD, HTN, IDA, pancytopenia, hx gastric ulcers  Patient presenting today as a new patient for diarrhea   Last labs 10/14/21 with normal renal function LFTs, sodium 137, potassium 4.4, hgb 12.2, plt 128k TSH dec 2022 was 0.905  Patient arrives with his sister who reports that he has diarrhea almost everytime he eats. Sometimes certain foods make symptoms worse. Symptoms started about 1 year ago. Has lost about 20 pounds over the past year. Denies any rectal bleeding.  Denies any abdominal pain. Sister states appetite is not great. He states that he will eat good if its something that he really likes. Denies nausea or vomiting. States amount of BMs depends on what he is eating. Red meat tends to cause more issues with diarrhea. He does note some dark stools on occasion, though not sure of exact melena, he is supposed to take PO iron pills but does not take these daily, taking them maybe 3-4x/week. He is taking pepto bismol for diarrhea maybe a few times per week with some relief of diarrhea. Unsure if dark stools are occurring around the time or iron pills/pepto bismol? Does not have diarrhea everyday but when he does have it he will have multiple episodes per day. He does endorse some fecal incontinence and nocturnal fecal soiling. Denies any tick bites or changes in medications prior to diarrhea starting. Denies any travel or sick contacts. GB is in situ. Patient's sister does report that patient does not finish  his meals as usual, will only take a few bites and stop.   Notably had to get a blood transfusion in February due to severely low hgb of 6.2 in January. He is maintained on PO iron. Patient and sister are unsure of cause for drop in hgb. Patient was hospitalized for a fall at that time, CT chest/abd/pelvis showed perinephric hematoma extending into right iliac fossa. Also question of hematoma in right gluteal region.   NSAID LEX:NTZG Social hx: none  Fam hx: no crc or liver disease, no pancreatic cancers   Last Colonoscopy:march 2013 Examination performed to cecum. Two small polyps ablated via cold biopsy from transverse colon and submitted together-one tubular adenoma  Small anal papilla. Last Endoscopy:04/22/14 Soft stricture at GE junction was dilated with 54 French Maloney dilator. Gastric ulcer has completely healed.  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   Anemia    Arthritis    hands   Carpal tunnel syndrome 08/05/2009   Qualifier: Diagnosis of  By: Aline Brochure MD, [name]     Chronic kidney disease    Diabetes mellitus    says since 1979 type 2   Diabetes mellitus without complication ([place])    Phreesia 04/03/2020   ERECTILE DYSFUNCTION, ORGANIC 01/20/2009   Qualifier: Diagnosis of  By: Moshe Cipro MD, Joycelyn Schmid  GERD (gastroesophageal reflux disease)    Glaucoma    both eyes   History of kidney stones    Hyperlipemia    Hypertension    Iron deficiency anemia 01/10/2014   Other pancytopenia ([place]) 01/10/2014   New in 01/2014, undergoing pathology review    Rash    front abdomen no drainage    Past Surgical History:  Procedure Laterality Date   AMPUTATION Right 09/01/2016   Procedure: AMPUTATION RIGHT MIDDLE FINGER TIP;  Surgeon: Mcarthur Rossetti, MD;  Location: [facility];  Service: Orthopedics;  Laterality: Right;    AMPUTATION Right 10/10/2016   Procedure: REPEAT IRRIGATION AND DEBRIDEMENT RIGHT MIDDLE FINGER WITH AMPUTATION THROUGH PROXIMAL PHALANX;  Surgeon: Mcarthur Rossetti, MD;  Location: [facility];  Service: Orthopedics;  Laterality: Right;   APPENDECTOMY     COLONOSCOPY  09/06/2011   Procedure: COLONOSCOPY;  Surgeon: Rogene Houston, MD;  Location: [facility];  Service: Endoscopy;  Laterality: N/A;  830   CYSTOSCOPY/RETROGRADE/URETEROSCOPY/STONE EXTRACTION WITH BASKET     ESOPHAGOGASTRODUODENOSCOPY N/A 01/15/2014   Procedure: ESOPHAGOGASTRODUODENOSCOPY (EGD);  Surgeon: Rogene Houston, MD;  Location: [facility];  Service: Endoscopy;  Laterality: N/A;  200   ESOPHAGOGASTRODUODENOSCOPY N/A 04/22/2014   Procedure: ESOPHAGOGASTRODUODENOSCOPY (EGD);  Surgeon: Rogene Houston, MD;  Location: [facility];  Service: Endoscopy;  Laterality: N/A;  240   EYE SURGERY Bilateral yrs ago   ioc for cataract   MALONEY DILATION N/A 01/15/2014   Procedure: MALONEY DILATION;  Surgeon: Rogene Houston, MD;  [facility]: [facility];  Service: Endoscopy;  Laterality: N/A;   MALONEY DILATION  04/22/2014   Procedure: MALONEY DILATION;  Surgeon: Rogene Houston, MD;  Location: [facility];  Service: Endoscopy;;   surgery for left eye bleedf  2017    Current Outpatient Medications  Medication Sig Dispense Refill   benazepril (LOTENSIN) 20 MG tablet Take 1 tablet (20 mg total) by mouth daily. 90 tablet 3   blood glucose meter kit and supplies Dispense based on patient and insurance preference. Use up to three times daily as directed. (dx E11.65). 1 each 0   CALCIUM PO Take 1 tablet by mouth daily.     Cholecalciferol (VITAMIN D3 PO) Take 1 tablet by mouth daily.     donepezil (ARICEPT) 10 MG tablet Take 1 tablet (10 mg total) by mouth at bedtime. (Patient taking differently: Take 10 mg by mouth daily.) 30 tablet 3   dorzolamide-timolol (COSOPT) 22.3-6.8 MG/ML ophthalmic solution Place 1 drop into both eyes 2 (two)  times daily.     insulin degludec (TRESIBA FLEXTOUCH) 100 UNIT/ML FlexTouch Pen Inject 5 Units into the skin at bedtime. 6 mL 3   Insulin Pen Needle (PEN NEEDLES) 31G X 6 MM MISC 1 each by Does not apply route at bedtime. 100 each 2   Iron, Ferrous Sulfate, 325 (65 Fe) MG TABS Take one tablet by mouth once daily (Patient taking differently: Take 325 mg by mouth daily.) 90 tablet 3   latanoprost (XALATAN) 0.005 % ophthalmic solution Place 1 drop into both eyes at bedtime.     ONETOUCH VERIO test strip USE AS DIRECTED UP TO 4 TIMES DAILY 100 strip 2   rosuvastatin (CRESTOR) 5 MG tablet Take one tablet by mouth every Monday , Wednesday and Friday (Patient taking differently: Take 5 mg by mouth every Monday, Wednesday, and Friday.) 36 tablet 3   TRADJENTA 5 MG TABS tablet TAKE 1 TABLET BY MOUTH EVERY DAY (Patient  taking differently: Take 5 mg by mouth daily.) 90 tablet 0   diphenoxylate-atropine (LOMOTIL) 2.5-0.025 MG tablet Take one tablet by mout htree times weekly as needed, for loose stool (Patient not taking: Reported on 12/01/2021) 24 tablet 0   pantoprazole (PROTONIX) 40 MG tablet TAKE 1 TABLET BY MOUTH EVERY DAY (Patient not taking: Reported on 12/01/2021) 90 tablet 1   Probiotic Product (PROBIOTIC DAILY PO) Take 1 tablet by mouth daily. (Patient not taking: Reported on 12/01/2021)     No current facility-administered medications for this visit.    Allergies as of 12/01/2021   (No Known Allergies)    Family History  Problem Relation Age of Onset   Diabetes Mother    Hypertension Mother    Hyperlipidemia Mother    Stroke Mother    Heart failure Mother    Diabetes Father    Heart disease Father    Hypertension Father    Hyperlipidemia Father    Heart attack Father    Diabetes Brother    Diabetes Brother    Diabetes Son     Social History   Socioeconomic History   Marital status: Married    Spouse name: Not on file   Number of children: Not on file   Years of education: Not on  file   Highest education level: Not on file  Occupational History   Not on file  Tobacco Use   Smoking status: Never    Passive exposure: Never   Smokeless tobacco: Never  Vaping Use   Vaping Use: Never used  Substance and Sexual Activity   Alcohol use: No   Drug use: No   Sexual activity: Yes  Other Topics Concern   Not on file  Social History Narrative   Not on file   Social Determinants of Health   Financial Resource Strain: Low Risk  (04/06/2020)   Overall Financial Resource Strain (CARDIA)    Difficulty of Paying Living Expenses: Not hard at all  Food Insecurity: No Food Insecurity (04/06/2020)   Hunger Vital Sign    Worried About Running Out of Food in the Last Year: Never true    Ran Out of Food in the Last Year: Never true  Transportation Needs: No Transportation Needs (04/06/2020)   PRAPARE - Hydrologist (Medical): No    Lack of Transportation (Non-Medical): No  Physical Activity: Insufficiently Active (04/06/2020)   Exercise Vital Sign    Days of Exercise per Week: 5 days    Minutes of Exercise per Session: 10 min  Stress: No Stress Concern Present (04/06/2020)   [place]    Feeling of Stress : Not at all  Social Connections: Moderately Isolated (04/06/2020)   Social Connection and Isolation Panel [NHANES]    Frequency of Communication with Friends and Family: More than three times a week    Frequency of Social Gatherings with Friends and Family: More than three times a week    Attends Religious Services: Never    Marine scientist or Organizations: No    Attends Music therapist: Never    Marital Status: Married   Review of systems General: negative for malaise, night sweats, fever, chills, weight loss Neck: Negative for lumps, goiter, pain and significant neck swelling Resp: Negative for cough, wheezing, dyspnea at rest CV: Negative for  chest pain, leg swelling, palpitations, orthopnea GI: denies  hematochezia, nausea, vomiting, constipation, dysphagia, odyonophagia +weight loss, diarrhea +  darker stools MSK: Negative for joint pain or swelling, back pain, and muscle pain. Derm: Negative for itching or rash Psych: Denies depression, anxiety, memory loss, confusion. No homicidal or suicidal ideation.  Heme: Negative for prolonged bleeding, bruising easily, and swollen nodes. Endocrine: Negative for cold or heat intolerance, polyuria, polydipsia and goiter. Neuro: negative for tremor, gait imbalance, syncope and seizures. The remainder of the review of systems is noncontributory.  Physical Exam: BP 118/67 (BP Location: Left Arm, Patient Position: Sitting, Cuff Size: Normal)   Pulse 69   Temp 98 F (36.7 C) (Oral)   Ht _0  (1.626 m)   Wt 110 lb 4.8 oz (50 kg)   BMI 18.93 kg/m  General:   Alert and oriented. No distress noted. Pleasant and cooperative.  Head:  Normocephalic and atraumatic. Eyes:  Conjuctiva clear without scleral icterus. Mouth:  Oral mucosa pink and moist. Good dentition. No lesions. Heart: Normal rate and rhythm, s1 and s2 heart sounds present.  Lungs: Clear lung sounds in all lobes. Respirations equal and unlabored. Abdomen:  +BS, soft, non-tender and non-distended. No rebound or guarding. No HSM or masses noted. Derm: No palmar erythema or jaundice Msk:  Symmetrical without gross deformities. Normal posture. Extremities:  Without edema. Neurologic:  Alert and  oriented x4 Psych:  Alert and cooperative. Normal mood and affect.  Invalid input(s): "6 MONTHS"   ASSESSMENT: KYSEN WETHERINGTON is a 81 y.o. male presenting today as a new patient for diarrhea.  Diarrhea: ongoing x1 year, worse with certain foods, especially red meat. He denies any known tick bites prior to symptoms starting. No changes in medications, sick contacts, or travel. Denies rectal bleeding. Notes some darker stools on occasion,  though unsure of specific melena, he does take iron pills and pepto bismol intermittently. Diarrhea occurrences depend on what he eats. He has no abdominal pain, nausea or vomiting. Sister reports appetite is not great and he has lost about 20 pounds since symptoms began. Will check alpha gal and celiac panel today. Last TCS 2013.   Anemia: notable anemia earlier this year with hgb 6.2, no iron studies checked at that time. Patient did receive transfusions. They are unsure of the cause of his anemia during that time as he had no reported rectal bleeding or melena though per review of chart, it appears patient had a traumatic fall with question of multiple internal hematomas, likely the source of his anemia at that time, though, No recent hgb or iron studies. Will check these today.   We discussed indications of proceeding with colonoscopy for further evaluation of his symptoms, especially given his weight loss. Also discussed the possibility of an EGD if iron studies are low. Patient and his sister prefer to have labs checked first and discuss further the need for colonoscopy/EGD depending on what the findings are.   PLAN:  Alpha gal, celiac panel 2. CBC and iron studies  3. Consider colonoscopy if no findings to suggest cause of diarrhea, given weight loss 4. Egd+colonoscopy if iron studies are abnormal   All questions were answered, patient verbalized understanding and is in agreement with plan as outlined above.    Follow Up: 3 months   Joyceann Kruser L. Alver Sorrow, MSN, APRN, AGNP-C Adult-Gerontology Nurse Practitioner [facility]

## 2021-12-01 NOTE — Patient Instructions (Signed)
It was nice to meet you! I would like to recheck blood counts and iron studies as you had  severely low hemoglobin earlier this year requiring a blood transfusion, if these counts are still low, we will likely need to discuss an upper endoscopy and colonoscopy. We will also check some labs to rule out underlying causes of diarrhea, it may be beneficial to do a colonoscopy in this regard as well if no findings in the labs to suggest cause of your diarrhea. You can take imodium as needed, do not exceed 4 tablets in 24 hours It may be helpful to keep a food/stool journal to document when you are having diarrhea in relation to what you are eating as well   Follow up 3 months

## 2021-12-03 LAB — INTERPRETATION:

## 2021-12-04 DIAGNOSIS — R634 Abnormal weight loss: Secondary | ICD-10-CM | POA: Insufficient documentation

## 2021-12-06 LAB — CBC
HCT: 37.2 % — ABNORMAL LOW (ref 38.5–50.0)
Hemoglobin: 12.2 g/dL — ABNORMAL LOW (ref 13.2–17.1)
MCH: 30.9 pg (ref 27.0–33.0)
MCHC: 32.8 g/dL (ref 32.0–36.0)
MCV: 94.2 fL (ref 80.0–100.0)
MPV: 12.9 fL — ABNORMAL HIGH (ref 7.5–12.5)
Platelets: 157 10*3/uL (ref 140–400)
RBC: 3.95 10*6/uL — ABNORMAL LOW (ref 4.20–5.80)
RDW: 13.2 % (ref 11.0–15.0)
WBC: 4.4 10*3/uL (ref 3.8–10.8)

## 2021-12-06 LAB — ALPHA-GAL PANEL
Allergen, Mutton, f88: 1.3 kU/L — ABNORMAL HIGH
Allergen, Pork, f26: 3.29 kU/L — ABNORMAL HIGH
Beef: 3.26 kU/L — ABNORMAL HIGH
CLASS: 2
CLASS: 2
Class: 2
GALACTOSE-ALPHA-1,3-GALACTOSE IGE*: 1.68 kU/L — ABNORMAL HIGH (ref <0.10)

## 2021-12-06 LAB — IRON,TIBC AND FERRITIN PANEL
%SAT: 36 % (calc) (ref 20–48)
Ferritin: 142 ng/mL (ref 24–380)
Iron: 89 ug/dL (ref 50–180)
TIBC: 245 mcg/dL (calc) — ABNORMAL LOW (ref 250–425)

## 2021-12-06 LAB — CELIAC DISEASE PANEL
(tTG) Ab, IgA: <1 U/mL
(tTG) Ab, IgG: 2.5 U/mL
Gliadin IgA: 1.8 U/mL
Gliadin IgG: <1 U/mL
Immunoglobulin A: 703 mg/dL — ABNORMAL HIGH (ref 70–320)

## 2021-12-06 LAB — INTERPRETATION:

## 2021-12-08 ENCOUNTER — Telehealth (INDEPENDENT_AMBULATORY_CARE_PROVIDER_SITE_OTHER): Payer: Self-pay | Admitting: *Deleted

## 2021-12-09 ENCOUNTER — Other Ambulatory Visit (INDEPENDENT_AMBULATORY_CARE_PROVIDER_SITE_OTHER): Payer: Self-pay

## 2021-12-09 ENCOUNTER — Telehealth (INDEPENDENT_AMBULATORY_CARE_PROVIDER_SITE_OTHER): Payer: Self-pay

## 2021-12-09 DIAGNOSIS — D649 Anemia, unspecified: Secondary | ICD-10-CM

## 2021-12-09 DIAGNOSIS — R197 Diarrhea, unspecified: Secondary | ICD-10-CM

## 2021-12-09 MED ORDER — PEG 3350-KCL-NA BICARB-NACL 420 G PO SOLR: 4000.0000 mL | ORAL | 0 refills | Status: DC

## 2021-12-09 NOTE — Telephone Encounter (Signed)
error 

## 2021-12-09 NOTE — Telephone Encounter (Signed)
Ephram Kornegay Ann Lemoyne Scarpati, CMA  ?

## 2021-12-13 ENCOUNTER — Encounter (INDEPENDENT_AMBULATORY_CARE_PROVIDER_SITE_OTHER): Payer: Self-pay

## 2022-01-09 NOTE — Patient Instructions (Signed)
   Your procedure is scheduled on: 01/13/2022  Report to [facility] Entrance at  9:00   AM.  Call this number if you have problems the morning of surgery: [phone]   Remember:              Follow Directions on the letter you received from Your Physician's office regarding the Bowel Prep              No Smoking the day of Procedure :   Take these medicines the morning of surgery with A SIP OF WATER: none  Take only 1/2 dose of tresiba insulin 2.5 units the night before procedure,  No diabetic medication am of procedure   Do not wear jewelry, make-up or nail polish.    Do not bring valuables to the hospital.  Contacts, dentures or bridgework may not be worn into surgery.  .   Patients discharged the day of surgery will not be allowed to drive home.     Colonoscopy, Adult, Care After This sheet gives you information about how to care for yourself after your procedure. Your health care provider may also give you more specific instructions. If you have problems or questions, contact your health care provider. What can I expect after the procedure? After the procedure, it is common to have: A small amount of blood in your stool for 24 hours after the procedure. Some gas. Mild abdominal cramping or bloating.  Follow these instructions at home: General instructions  For the first 24 hours after the procedure: Do not drive or use machinery. Do not sign important documents. Do not drink alcohol. Do your regular daily activities at a slower pace than normal. Eat soft, easy-to-digest foods. Rest often. Take over-the-counter or prescription medicines only as told by your health care provider. It is up to you to get the results of your procedure. Ask your health care provider, or the department performing the procedure, when your results will be ready. Relieving cramping and bloating Try walking around when you have cramps or feel bloated. Apply heat to your abdomen as told  by your health care provider. Use a heat source that your health care provider recommends, such as a moist heat pack or a heating pad. Place a towel between your skin and the heat source. Leave the heat on for 20-30 minutes. Remove the heat if your skin turns bright red. This is especially important if you are unable to feel pain, heat, or cold. You may have a greater risk of getting burned. Eating and drinking Drink enough fluid to keep your urine clear or pale yellow. Resume your normal diet as instructed by your health care provider. Avoid heavy or fried foods that are hard to digest. Avoid drinking alcohol for as long as instructed by your health care provider. Contact a health care provider if: You have blood in your stool 2-3 days after the procedure. Get help right away if: You have more than a small spotting of blood in your stool. You pass large blood clots in your stool. Your abdomen is swollen. You have nausea or vomiting. You have a fever. You have increasing abdominal pain that is not relieved with medicine. This information is not intended to replace advice given to you by your health care provider. Make sure you discuss any questions you have with your health care provider. Document Released: 11/09/2003 Document Revised: 12/20/2015 Document Reviewed: 06/08/2015 Elsevier Interactive Patient Education  [employer].

## 2022-01-10 ENCOUNTER — Other Ambulatory Visit (HOSPITAL_COMMUNITY): Payer: Medicare HMO

## 2022-01-10 ENCOUNTER — Encounter (HOSPITAL_COMMUNITY)
Admission: RE | Admit: 2022-01-10 | Discharge: 2022-01-10 | Disposition: A | Discharge status: Home / Self Care | Payer: Medicare HMO | Source: Ambulatory Visit | Attending: Gastroenterology | Admitting: Gastroenterology

## 2022-01-10 DIAGNOSIS — N183 Chronic kidney disease, stage 3 unspecified: Secondary | ICD-10-CM | POA: Diagnosis not present

## 2022-01-10 DIAGNOSIS — D649 Anemia, unspecified: Secondary | ICD-10-CM

## 2022-01-10 DIAGNOSIS — D6489 Other specified anemias: Secondary | ICD-10-CM

## 2022-01-10 DIAGNOSIS — D631 Anemia in chronic kidney disease: Secondary | ICD-10-CM | POA: Diagnosis not present

## 2022-01-10 DIAGNOSIS — H04123 Dry eye syndrome of bilateral lacrimal glands: Secondary | ICD-10-CM

## 2022-01-10 LAB — BASIC METABOLIC PANEL
Anion gap: 8 (ref 5–15)
BUN: 17 mg/dL (ref 8–23)
CO2: 24 mmol/L (ref 22–32)
Calcium: 8.3 mg/dL — ABNORMAL LOW (ref 8.9–10.3)
Chloride: 106 mmol/L (ref 98–111)
Creatinine, Ser: 1.2 mg/dL (ref 0.61–1.24)
GFR, Estimated: >60 mL/min (ref >60)
Glucose, Bld: 175 mg/dL — ABNORMAL HIGH (ref 70–99)
Potassium: 3.8 mmol/L (ref 3.5–5.1)
Sodium: 138 mmol/L (ref 135–145)

## 2022-01-13 ENCOUNTER — Encounter (HOSPITAL_COMMUNITY)
Admission: RE | Disposition: A | Discharge status: Home / Self Care | Payer: Self-pay | Source: Home / Self Care | Attending: Gastroenterology

## 2022-01-13 ENCOUNTER — Other Ambulatory Visit: Payer: Self-pay

## 2022-01-13 ENCOUNTER — Telehealth (INDEPENDENT_AMBULATORY_CARE_PROVIDER_SITE_OTHER): Payer: Self-pay | Admitting: *Deleted

## 2022-01-13 ENCOUNTER — Ambulatory Visit (HOSPITAL_COMMUNITY): Payer: Medicare HMO | Admitting: Anesthesiology

## 2022-01-13 ENCOUNTER — Ambulatory Visit (HOSPITAL_COMMUNITY)
Admission: RE | Admit: 2022-01-13 | Discharge: 2022-01-13 | Disposition: A | Discharge status: Home / Self Care | Payer: Medicare HMO | Attending: Gastroenterology | Admitting: Gastroenterology

## 2022-01-13 ENCOUNTER — Encounter (HOSPITAL_COMMUNITY): Payer: Self-pay | Admitting: Gastroenterology

## 2022-01-13 ENCOUNTER — Ambulatory Visit (HOSPITAL_BASED_OUTPATIENT_CLINIC_OR_DEPARTMENT_OTHER): Payer: Medicare HMO | Admitting: Anesthesiology

## 2022-01-13 DIAGNOSIS — E1122 Type 2 diabetes mellitus with diabetic chronic kidney disease: Secondary | ICD-10-CM | POA: Insufficient documentation

## 2022-01-13 DIAGNOSIS — Z8349 Family history of other endocrine, nutritional and metabolic diseases: Secondary | ICD-10-CM | POA: Insufficient documentation

## 2022-01-13 DIAGNOSIS — K635 Polyp of colon: Secondary | ICD-10-CM | POA: Diagnosis not present

## 2022-01-13 DIAGNOSIS — Z7984 Long term (current) use of oral hypoglycemic drugs: Secondary | ICD-10-CM | POA: Insufficient documentation

## 2022-01-13 DIAGNOSIS — G709 Myoneural disorder, unspecified: Secondary | ICD-10-CM | POA: Insufficient documentation

## 2022-01-13 DIAGNOSIS — Z833 Family history of diabetes mellitus: Secondary | ICD-10-CM | POA: Diagnosis not present

## 2022-01-13 DIAGNOSIS — Z79899 Other long term (current) drug therapy: Secondary | ICD-10-CM | POA: Diagnosis not present

## 2022-01-13 DIAGNOSIS — Z794 Long term (current) use of insulin: Secondary | ICD-10-CM | POA: Diagnosis not present

## 2022-01-13 DIAGNOSIS — M1990 Unspecified osteoarthritis, unspecified site: Secondary | ICD-10-CM | POA: Diagnosis not present

## 2022-01-13 DIAGNOSIS — F0390 Unspecified dementia without behavioral disturbance: Secondary | ICD-10-CM | POA: Insufficient documentation

## 2022-01-13 DIAGNOSIS — E785 Hyperlipidemia, unspecified: Secondary | ICD-10-CM | POA: Insufficient documentation

## 2022-01-13 DIAGNOSIS — Z8249 Family history of ischemic heart disease and other diseases of the circulatory system: Secondary | ICD-10-CM | POA: Diagnosis not present

## 2022-01-13 DIAGNOSIS — K219 Gastro-esophageal reflux disease without esophagitis: Secondary | ICD-10-CM | POA: Insufficient documentation

## 2022-01-13 DIAGNOSIS — D649 Anemia, unspecified: Secondary | ICD-10-CM

## 2022-01-13 DIAGNOSIS — D128 Benign neoplasm of rectum: Secondary | ICD-10-CM | POA: Insufficient documentation

## 2022-01-13 DIAGNOSIS — K621 Rectal polyp: Secondary | ICD-10-CM

## 2022-01-13 DIAGNOSIS — D123 Benign neoplasm of transverse colon: Secondary | ICD-10-CM | POA: Insufficient documentation

## 2022-01-13 DIAGNOSIS — N189 Chronic kidney disease, unspecified: Secondary | ICD-10-CM | POA: Diagnosis not present

## 2022-01-13 DIAGNOSIS — D124 Benign neoplasm of descending colon: Secondary | ICD-10-CM | POA: Insufficient documentation

## 2022-01-13 DIAGNOSIS — D61818 Other pancytopenia: Secondary | ICD-10-CM | POA: Insufficient documentation

## 2022-01-13 DIAGNOSIS — R197 Diarrhea, unspecified: Secondary | ICD-10-CM | POA: Diagnosis present

## 2022-01-13 DIAGNOSIS — I10 Essential (primary) hypertension: Secondary | ICD-10-CM | POA: Diagnosis not present

## 2022-01-13 DIAGNOSIS — I129 Hypertensive chronic kidney disease with stage 1 through stage 4 chronic kidney disease, or unspecified chronic kidney disease: Secondary | ICD-10-CM | POA: Diagnosis not present

## 2022-01-13 DIAGNOSIS — D6489 Other specified anemias: Secondary | ICD-10-CM

## 2022-01-13 HISTORY — PX: POLYPECTOMY: SHX5525

## 2022-01-13 HISTORY — PX: COLONOSCOPY WITH PROPOFOL: SHX5780

## 2022-01-13 HISTORY — PX: BIOPSY: SHX5522

## 2022-01-13 LAB — CBC WITH DIFFERENTIAL/PLATELET
Abs Immature Granulocytes: 0 10*3/uL (ref 0.00–0.07)
Basophils Absolute: 0 10*3/uL (ref 0.0–0.1)
Basophils Relative: 0 %
Eosinophils Absolute: 0 10*3/uL (ref 0.0–0.5)
Eosinophils Relative: 0 %
HCT: 34.4 % — ABNORMAL LOW (ref 39.0–52.0)
Hemoglobin: 11.1 g/dL — ABNORMAL LOW (ref 13.0–17.0)
Lymphocytes Relative: 55 %
Lymphs Abs: 2.7 10*3/uL (ref 0.7–4.0)
MCH: 30.5 pg (ref 26.0–34.0)
MCHC: 32.3 g/dL (ref 30.0–36.0)
MCV: 94.5 fL (ref 80.0–100.0)
Monocytes Absolute: 0.7 10*3/uL (ref 0.1–1.0)
Monocytes Relative: 15 %
Neutro Abs: 1.5 10*3/uL — ABNORMAL LOW (ref 1.7–7.7)
Neutrophils Relative %: 30 %
Platelets: 133 10*3/uL — ABNORMAL LOW (ref 150–400)
RBC: 3.64 MIL/uL — ABNORMAL LOW (ref 4.22–5.81)
RDW: 15.3 % (ref 11.5–15.5)
WBC: 4.9 10*3/uL (ref 4.0–10.5)
nRBC: 0 % (ref 0.0–0.2)

## 2022-01-13 LAB — GLUCOSE, CAPILLARY: Glucose-Capillary: 108 mg/dL — ABNORMAL HIGH (ref 70–99)

## 2022-01-13 LAB — HM COLONOSCOPY

## 2022-01-13 SURGERY — COLONOSCOPY WITH PROPOFOL
Anesthesia: General

## 2022-01-13 MED ORDER — LACTATED RINGERS IV SOLN: INTRAVENOUS | Status: DC

## 2022-01-13 MED ORDER — EPHEDRINE SULFATE (PRESSORS) 50 MG/ML IJ SOLN
INTRAMUSCULAR | Status: DC | PRN
  Administered 2022-01-13 (×2): 5 mg via INTRAVENOUS

## 2022-01-13 MED ORDER — LIDOCAINE HCL (CARDIAC) PF 100 MG/5ML IV SOSY
PREFILLED_SYRINGE | INTRAVENOUS | Status: DC | PRN
  Administered 2022-01-13: 50 mg via INTRAVENOUS

## 2022-01-13 MED ORDER — PROPOFOL 10 MG/ML IV BOLUS
INTRAVENOUS | Status: DC | PRN
  Administered 2022-01-13: 50 mg via INTRAVENOUS

## 2022-01-13 MED ORDER — PROPOFOL 500 MG/50ML IV EMUL
INTRAVENOUS | Status: DC | PRN
  Administered 2022-01-13: 125 ug/kg/min via INTRAVENOUS

## 2022-01-13 NOTE — Discharge Instructions (Signed)
You are being discharged to home.  Resume your previous diet.  We are waiting for your pathology results.  Your physician has indicated that a repeat colonoscopy is not recommended due to your current age (19 years or older) for screening purposes.  Continue curretn antidiarrheal regimen.

## 2022-01-13 NOTE — H&P (Signed)
Jacob Barnes is an 81 y.o. male.   Chief Complaint: chronic diarrhea HPI: Jacob Barnes is a 81 y.o. male with past medical history of anemia, arthritis, carpal tunnel, DM, ED, GERD, glaucoma, HLD, HTN, IDA, pancytopenia, hx gastric ulcers coming for evaluation of diarrhea.  Patient reports for the last few months he has presented progressive diarrhea, multiple episodes of watery bowel movements per day without a clear explanation.  Reports having changed his diet after having a positive alpha-gal panel but has not presented complete resolution of his diarrhea, still having some episodes but is better than before.  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   Anemia    Arthritis    hands   Carpal tunnel syndrome 08/05/2009   Qualifier: Diagnosis of  By: Aline Brochure MD, [name]     Chronic kidney disease    Diabetes mellitus    says since 1979 type 2   Diabetes mellitus without complication ([place])    Phreesia 04/03/2020   ERECTILE DYSFUNCTION, ORGANIC 01/20/2009   Qualifier: Diagnosis of  By: Moshe Cipro MD, Margaret     GERD (gastroesophageal reflux disease)    Glaucoma    both eyes   History of kidney stones    Hyperlipemia    Hypertension    Iron deficiency anemia 01/10/2014   Other pancytopenia ([place]) 01/10/2014   New in 01/2014, undergoing pathology review    Rash    front abdomen no drainage    Past Surgical History:  Procedure Laterality Date   AMPUTATION Right 09/01/2016   Procedure: AMPUTATION RIGHT MIDDLE FINGER TIP;  Surgeon: Mcarthur Rossetti, MD;  Location: [facility];  Service: Orthopedics;  Laterality: Right;   AMPUTATION Right 10/10/2016   Procedure: REPEAT IRRIGATION AND DEBRIDEMENT RIGHT MIDDLE FINGER WITH AMPUTATION THROUGH PROXIMAL PHALANX;  Surgeon: Mcarthur Rossetti, MD;  Location: [facility];  Service: Orthopedics;  Laterality: Right;   APPENDECTOMY      COLONOSCOPY  09/06/2011   Procedure: COLONOSCOPY;  Surgeon: Rogene Houston, MD;  Location: [facility];  Service: Endoscopy;  Laterality: N/A;  830   CYSTOSCOPY/RETROGRADE/URETEROSCOPY/STONE EXTRACTION WITH BASKET     ESOPHAGOGASTRODUODENOSCOPY N/A 01/15/2014   Procedure: ESOPHAGOGASTRODUODENOSCOPY (EGD);  Surgeon: Rogene Houston, MD;  Location: [facility];  Service: Endoscopy;  Laterality: N/A;  200   ESOPHAGOGASTRODUODENOSCOPY N/A 04/22/2014   Procedure: ESOPHAGOGASTRODUODENOSCOPY (EGD);  Surgeon: Rogene Houston, MD;  Location: [facility];  Service: Endoscopy;  Laterality: N/A;  240   EYE SURGERY Bilateral yrs ago   ioc for cataract   MALONEY DILATION N/A 01/15/2014   Procedure: MALONEY DILATION;  Surgeon: Rogene Houston, MD;  Location: [facility];  Service: Endoscopy;  Laterality: N/A;   MALONEY DILATION  04/22/2014   Procedure: Venia Minks DILATION;  Surgeon: Rogene Houston, MD;  Location[facility];  Service: Endoscopy;;   surgery for left eye bleedf  2017    Family History  Problem Relation Age of Onset   Diabetes Mother    Hypertension Mother    Hyperlipidemia Mother    Stroke Mother    Heart failure Mother    Diabetes Father    Heart disease Father    Hypertension Father    Hyperlipidemia Father    Heart attack Father    Diabetes Brother    Diabetes Brother    Diabetes Son  Social History:  reports that he has never smoked. He has never been exposed to tobacco smoke. He has never used smokeless tobacco. He reports that he does not drink alcohol and does not use drugs.  Allergies: No Known Allergies  Medications Prior to Admission  Medication Sig Dispense Refill   benazepril (LOTENSIN) 20 MG tablet Take 1 tablet (20 mg total) by mouth daily. (Patient taking differently: Take 20 mg by mouth 3 (three) times a week.) 90 tablet 3   bismuth subsalicylate (PEPTO BISMOL) 262 MG/15ML suspension Take 30 mLs by mouth every 6 (six) hours as needed for diarrhea or loose  stools or indigestion.     CALCIUM PO Take 1 tablet by mouth 3 (three) times a week.     Cholecalciferol (VITAMIN D3 PO) Take 1 tablet by mouth 3 (three) times a week.     diphenoxylate-atropine (LOMOTIL) 2.5-0.025 MG tablet Take one tablet by mout htree times weekly as needed, for loose stool (Patient taking differently: Take 1 tablet by mouth See admin instructions. Take one tablet by mout three times weekly as needed, for loose stool) 24 tablet 0   donepezil (ARICEPT) 10 MG tablet Take 1 tablet (10 mg total) by mouth at bedtime. 30 tablet 3   donepezil (ARICEPT) 5 MG tablet Take 5 mg by mouth at bedtime.     dorzolamide-timolol (COSOPT) 22.3-6.8 MG/ML ophthalmic solution Place 1 drop into both eyes 3 (three) times a week.     insulin degludec (TRESIBA FLEXTOUCH) 100 UNIT/ML FlexTouch Pen Inject 5 Units into the skin at bedtime. (Patient taking differently: Inject 3-5 Units into the skin at bedtime.) 6 mL 3   Iron, Ferrous Sulfate, 325 (65 Fe) MG TABS Take one tablet by mouth once daily (Patient taking differently: Take 325 mg by mouth 3 (three) times a week.) 90 tablet 3   latanoprost (XALATAN) 0.005 % ophthalmic solution Place 1 drop into both eyes 3 (three) times a week. At bedtime     pantoprazole (PROTONIX) 40 MG tablet TAKE 1 TABLET BY MOUTH EVERY DAY (Patient taking differently: Take 40 mg by mouth 3 (three) times a week.) 90 tablet 1   Probiotic Product (PROBIOTIC DAILY PO) Take 1 tablet by mouth 3 (three) times a week.     rosuvastatin (CRESTOR) 5 MG tablet Take one tablet by mouth every Monday , Wednesday and Friday (Patient taking differently: Take 5 mg by mouth every Monday, Wednesday, and Friday.) 36 tablet 3   TRADJENTA 5 MG TABS tablet TAKE 1 TABLET BY MOUTH EVERY DAY (Patient taking differently: Take 5 mg by mouth 3 (three) times a week.) 90 tablet 0   blood glucose meter kit and supplies Dispense based on patient and insurance preference. Use up to three times daily as directed. (dx  E11.65). 1 each 0   Insulin Pen Needle (PEN NEEDLES) 31G X 6 MM MISC 1 each by Does not apply route at bedtime. 100 each 2   ONETOUCH VERIO test strip USE AS DIRECTED UP TO 4 TIMES DAILY 100 strip 2   polyethylene glycol-electrolytes (TRILYTE) 420 g solution Take 4,000 mLs by mouth as directed. 4000 mL 0    Results for orders placed or performed during the hospital encounter of 01/13/22 (from the past 48 hour(s))  Glucose, capillary     Status: Abnormal   Collection Time: 01/13/22  6:46 AM  Result Value Ref Range   Glucose-Capillary 108 (H) 70 - 99 mg/dL    Comment: Glucose reference range applies only  to samples taken after fasting for at least 8 hours.   No results found.  Review of Systems  Constitutional:  Positive for unexpected weight change.  Eyes: Negative.   Respiratory: Negative.    Cardiovascular: Negative.   Gastrointestinal:  Positive for diarrhea.  Endocrine: Negative.   Genitourinary: Negative.   Musculoskeletal: Negative.   Skin: Negative.   Allergic/Immunologic: Negative.   Neurological: Negative.   Hematological: Negative.   Psychiatric/Behavioral: Negative.      Blood pressure (!) 176/83, pulse 64, temperature 97.9 F (36.6 C), temperature source Oral, resp. rate 11, height '5\' 5"'  (1.651 m), weight 54.4 kg, SpO2 100 %. Physical Exam  GENERAL: The patient is AO x3, in no acute distress.  Underweight. HEENT: Head is normocephalic and atraumatic. EOMI are intact. Mouth is well hydrated and without lesions. NECK: Supple. No masses LUNGS: Clear to auscultation. No presence of rhonchi/wheezing/rales. Adequate chest expansion HEART: RRR, normal s1 and s2. ABDOMEN: Soft, nontender, no guarding, no peritoneal signs, and nondistended. BS +. No masses. EXTREMITIES: Without any cyanosis, clubbing, rash, lesions or edema. NEUROLOGIC: AOx3, no focal motor deficit. SKIN: no jaundice, no rashes  Assessment/Plan BORDEN THUNE is a 81 y.o. male with past medical history of  anemia, arthritis, carpal tunnel, DM, ED, GERD, glaucoma, HLD, HTN, IDA, pancytopenia, hx gastric ulcers coming for evaluation of diarrhea.  We will proceed with colonoscopy.  Harvel Quale, MD 01/13/2022, 7:38 AM

## 2022-01-13 NOTE — Anesthesia Preprocedure Evaluation (Signed)
Anesthesia Evaluation  Patient identified by MRN, date of birth, ID band Patient awake    Reviewed: Allergy & Precautions, NPO status , Patient's Chart, lab work & pertinent test results  Airway Mallampati: I  TM Distance: >3 FB Neck ROM: Full    Dental  (+) Upper Dentures, Edentulous Lower   Pulmonary neg pulmonary ROS,    Pulmonary exam normal breath sounds clear to auscultation       Cardiovascular Exercise Tolerance: Good hypertension, Pt. on medications Normal cardiovascular exam Rhythm:Regular Rate:Normal  [id number] 03-May-2021 18:02:12 [facility] ROUTINE RECORD [id number] (86 yr) Male Black Vent. rate 92 BPM PR interval 188 ms QRS duration 91 ms QT/QTcB 373/462 ms   Neuro/Psych PSYCHIATRIC DISORDERS Dementia  Neuromuscular disease    GI/Hepatic Neg liver ROS, GERD  Medicated and Controlled,  Endo/Other  diabetes, Well Controlled, Type 2, Oral Hypoglycemic Agents, Insulin Dependent  Renal/GU Renal InsufficiencyRenal disease  negative genitourinary   Musculoskeletal  (+) Arthritis , Osteoarthritis,    Abdominal   Peds negative pediatric ROS (+)  Hematology  (+) Blood dyscrasia, anemia ,   Anesthesia Other Findings   Reproductive/Obstetrics negative OB ROS                           Anesthesia Physical Anesthesia Plan  ASA: 3  Anesthesia Plan: General   Post-op Pain Management: Minimal or no pain anticipated   Induction: Intravenous  PONV Risk Score and Plan: Propofol infusion  Airway Management Planned: Nasal Cannula and Natural Airway  Additional Equipment:   Intra-op Plan:   Post-operative Plan:   Informed Consent: I have reviewed the patients History and Physical, chart, labs and discussed the procedure including the risks, benefits and alternatives for the proposed anesthesia with the patient or authorized representative who has indicated  his/her understanding and acceptance.     Dental advisory given  Plan Discussed with: CRNA and Surgeon  Anesthesia Plan Comments:         Anesthesia Quick Evaluation

## 2022-01-13 NOTE — Telephone Encounter (Signed)
Patient had TCS today and sister who took him Jacob Barnes phone number [phone] (she is on release of information) came in office after procedure and concerned about EGD not being done. She thought he was having TCS and EGD. I let her know the office note stated he needed EGD if firon studies were low and iron studies were normal. She verbalized understanding and I let her know I would send a note back to Dr. Jenetta Downer for him to review if there was any other reason he would need one. She reports he does not have issues with trouble swallowing or reflux. He was having diarrhea which is much better since stopping eating red meat. He has only had diarrhea 2 or 3 times since august office visit.

## 2022-01-13 NOTE — Telephone Encounter (Signed)
Thanks Abigail Butts, that's correct EGD was not performed as he did not have iron deficiency anemia. Thanks

## 2022-01-13 NOTE — Op Note (Signed)
[facility] Patient Name: Jacob Barnes Procedure Date: 01/13/2022 6:53 AM MRN: [id number] Date of Birth: [birth year] Attending MD: Maylon Peppers ,  CSN: [id number] Age: 81 Admit Type: Outpatient Procedure:                Colonoscopy Indications:              Clinically significant diarrhea of unexplained                            origin Providers:                Maylon Peppers, Lambert Mody, Rosina Lowenstein,                            RN Referring MD:              Medicines:                Monitored Anesthesia Care Complications:            No immediate complications. Estimated Blood Loss:     Estimated blood loss: none. Procedure:                Pre-Anesthesia Assessment:                           - Prior to the procedure, a History and Physical                            was performed, and patient medications, allergies                            and sensitivities were reviewed. The patient's                            tolerance of previous anesthesia was reviewed.                           - The risks and benefits of the procedure and the                            sedation options and risks were discussed with the                            patient. All questions were answered and informed                            consent was obtained.                           - ASA Grade Assessment: III - A patient with severe                            systemic disease.                           After obtaining informed consent, the colonoscope  was passed under direct vision. Throughout the                            procedure, the patient's blood pressure, pulse, and                            oxygen saturations were monitored continuously. The                            [id number]-[id number] ([id number]) scope was introduced through                            the anus and advanced to the the cecum, identified                            by appendiceal orifice and  ileocecal valve. The                            colonoscopy was performed without difficulty. The                            patient tolerated the procedure well. The quality                            of the bowel preparation was good. Scope In: 7:48:54 AM Scope Out: 8:16:01 AM Scope Withdrawal Time: 0 hours 15 minutes 43 seconds  Total Procedure Duration: 0 hours 27 minutes 7 seconds  Findings:      The perianal and digital rectal examinations were normal.      Three sessile polyps were found in the rectum, descending colon and       transverse colon. The polyps were 3 to 6 mm in size. These polyps were       removed with a cold snare. Resection and retrieval were complete.      The rest of the colon appeared normal. Biopsies for histology were taken       with a cold forceps from the right colon and left colon for evaluation       of microscopic colitis.      The retroflexed view of the distal rectum and anal verge was normal and       showed no anal or rectal abnormalities. Impression:               - Three 3 to 6 mm polyps in the rectum, in the                            descending colon and in the transverse colon,                            removed with a cold snare. Resected and retrieved.                           - The rest of the examined colon is normal.  Biopsied.                           - The distal rectum and anal verge are normal on                            retroflexion view. Moderate Sedation:      Per Anesthesia Care Recommendation:           - Discharge patient to home (ambulatory).                           - Resume previous diet - avoid beef and pork.                           - Await pathology results.                           - Repeat colonoscopy is not recommended due to                            current age (51 years or older) for screening                            purposes.                           - Continue curretn  antidiarrheal regimen. Procedure Code(s):        --- Professional ---                           [phone], Colonoscopy, flexible; with removal of                            tumor(s), polyp(s), or other lesion(s) by snare                            technique                           45380, 70, Colonoscopy, flexible; with biopsy,                            single or multiple Diagnosis Code(s):        --- Professional ---                           K62.1, Rectal polyp                           K63.5, Polyp of colon                           R19.7, Diarrhea, unspecified CPT copyright 2019 American Medical Association. All rights reserved. The codes documented in this report are preliminary and upon coder review may  be revised to meet current compliance requirements. Maylon Peppers, MD Maylon Peppers,  01/13/2022 8:27:33 AM This report has been signed electronically.  Number of Addenda: 0 

## 2022-01-13 NOTE — Transfer of Care (Signed)
Immediate Anesthesia Transfer of Care Note  Patient: Jacob Barnes  Procedure(s) Performed: COLONOSCOPY WITH PROPOFOL POLYPECTOMY BIOPSY  Patient Location: Short Stay  Anesthesia Type:General  Level of Consciousness: drowsy  Airway & Oxygen Therapy: Patient Spontanous Breathing  Post-op Assessment: Report given to RN and Post -op Vital signs reviewed and stable  Post vital signs: Reviewed and stable  Last Vitals:  Vitals Value Taken Time  BP 113/51 01/13/22 0820  Temp 36.4 C 01/13/22 0820  Pulse 64 01/13/22 0820  Resp 10 01/13/22 0820  SpO2 100 % 01/13/22 0820    Last Pain:  Vitals:   01/13/22 0820  TempSrc: Axillary  PainSc:       Patients Stated Pain Goal: 6 (88/33/74 4514)  Complications: No notable events documented.

## 2022-01-13 NOTE — Anesthesia Postprocedure Evaluation (Signed)
Anesthesia Post Note  Patient: Jacob Barnes  Procedure(s) Performed: COLONOSCOPY WITH PROPOFOL POLYPECTOMY BIOPSY  Patient location during evaluation: Phase II Anesthesia Type: General Level of consciousness: awake and alert and oriented Pain management: pain level controlled Vital Signs Assessment: post-procedure vital signs reviewed and stable Respiratory status: spontaneous breathing, nonlabored ventilation and respiratory function stable Cardiovascular status: blood pressure returned to baseline and stable Postop Assessment: no apparent nausea or vomiting Anesthetic complications: no   No notable events documented.   Last Vitals:  Vitals:   01/13/22 0639 01/13/22 0820  BP: (!) 176/83 (!) 113/51  Pulse: 64 64  Resp: 11 10  Temp: 36.6 C (!) 36.4 C  SpO2: 100% 100%    Last Pain:  Vitals:   01/13/22 0820  TempSrc: Axillary  PainSc: 0-No pain                 Cain Fitzhenry C Landri Dorsainvil

## 2022-01-16 LAB — SURGICAL PATHOLOGY

## 2022-01-16 NOTE — Telephone Encounter (Signed)
Patient's sister ( on dpr) notified.

## 2022-01-17 LAB — HM DIABETES EYE EXAM

## 2022-01-19 ENCOUNTER — Encounter (HOSPITAL_COMMUNITY): Payer: Self-pay | Admitting: Gastroenterology

## 2022-01-24 ENCOUNTER — Encounter (INDEPENDENT_AMBULATORY_CARE_PROVIDER_SITE_OTHER): Payer: Self-pay | Admitting: *Deleted

## 2022-01-26 ENCOUNTER — Ambulatory Visit: Payer: Medicare HMO | Admitting: Nurse Practitioner

## 2022-01-26 ENCOUNTER — Encounter: Payer: Self-pay | Admitting: Nurse Practitioner

## 2022-01-26 VITALS — BP 122/67 | HR 69 | Ht 64.0 in | Wt 113.0 lb

## 2022-01-26 DIAGNOSIS — I10 Essential (primary) hypertension: Secondary | ICD-10-CM | POA: Diagnosis not present

## 2022-01-26 DIAGNOSIS — E782 Mixed hyperlipidemia: Secondary | ICD-10-CM

## 2022-01-26 DIAGNOSIS — E1159 Type 2 diabetes mellitus with other circulatory complications: Secondary | ICD-10-CM

## 2022-01-26 LAB — POCT GLYCOSYLATED HEMOGLOBIN (HGB A1C): Hemoglobin A1C: 9.1 % — AB (ref 4.0–5.6)

## 2022-01-26 MED ORDER — LINAGLIPTIN 5 MG PO TABS: 5.0000 mg | ORAL_TABLET | Freq: Every day | ORAL | 1 refills | Status: DC

## 2022-01-26 MED ORDER — TRESIBA FLEXTOUCH 100 UNIT/ML ~~LOC~~ SOPN: 5.0000 [IU] | PEN_INJECTOR | Freq: Every day | SUBCUTANEOUS | 3 refills | Status: DC

## 2022-01-26 NOTE — Progress Notes (Signed)
Endocrinology Follow Up Visit       01/26/2022, 2:49 PM   Subjective:    Patient ID: Jacob Barnes, male    DOB: 01/18/1941.  Jacob Barnes is being seen in follow up after being seen in consultation for management of currently uncontrolled symptomatic diabetes requested by  Fayrene Helper, MD.   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   Anemia    Arthritis    hands   Carpal tunnel syndrome 08/05/2009   Qualifier: Diagnosis of  By: Aline Brochure MD, [name]     Chronic kidney disease    Diabetes mellitus    says since 1979 type 2   Diabetes mellitus without complication ([place])    Phreesia 04/03/2020   ERECTILE DYSFUNCTION, ORGANIC 01/20/2009   Qualifier: Diagnosis of  By: Moshe Cipro MD, Margaret     GERD (gastroesophageal reflux disease)    Glaucoma    both eyes   History of kidney stones    Hyperlipemia    Hypertension    Iron deficiency anemia 01/10/2014   Other pancytopenia ([place]) 01/10/2014   New in 01/2014, undergoing pathology review    Rash    front abdomen no drainage    Past Surgical History:  Procedure Laterality Date   AMPUTATION Right 09/01/2016   Procedure: AMPUTATION RIGHT MIDDLE FINGER TIP;  Surgeon: Mcarthur Rossetti, MD;  Location: [facility];  Service: Orthopedics;  Laterality: Right;   AMPUTATION Right 10/10/2016   Procedure: REPEAT IRRIGATION AND DEBRIDEMENT RIGHT MIDDLE FINGER WITH AMPUTATION THROUGH PROXIMAL PHALANX;  Surgeon: Mcarthur Rossetti, MD;  Location: [facility];  Service: Orthopedics;  Laterality: Right;   APPENDECTOMY     BIOPSY  01/13/2022   Procedure: BIOPSY;  Surgeon: Harvel Quale, MD;  Location: [facility];  Service: Gastroenterology;;   COLONOSCOPY  09/06/2011   Procedure: COLONOSCOPY;  Surgeon: Rogene Houston, MD;  [facility];  Service:  Endoscopy;  Laterality: N/A;  830   COLONOSCOPY WITH PROPOFOL N/A 01/13/2022   Procedure: COLONOSCOPY WITH PROPOFOL;  Surgeon: Harvel Quale, MD;  [facility];  Service: Gastroenterology;  Laterality: N/A;  200 ASA 3   CYSTOSCOPY/RETROGRADE/URETEROSCOPY/STONE EXTRACTION WITH BASKET     ESOPHAGOGASTRODUODENOSCOPY N/A 01/15/2014   Procedure: ESOPHAGOGASTRODUODENOSCOPY (EGD);  Surgeon: Rogene Houston, MD;  [facility];  Service: Endoscopy;  Laterality: N/A;  200   ESOPHAGOGASTRODUODENOSCOPY N/A 04/22/2014   Procedure: ESOPHAGOGASTRODUODENOSCOPY (EGD);  Surgeon: Rogene Houston, MD;  [facility];  Service: Endoscopy;  Laterality: N/A;  240   EYE SURGERY Bilateral yrs ago   ioc for cataract   MALONEY DILATION N/A 01/15/2014   Procedure: MALONEY DILATION;  Surgeon: Rogene Houston, MD;  [facility];  Service: Endoscopy;  Laterality: N/A;   MALONEY DILATION  04/22/2014   Procedure: Venia Minks DILATION;  Surgeon: Rogene Houston, MD;  [facility];  Service: Endoscopy;;   POLYPECTOMY  01/13/2022   Procedure: POLYPECTOMY;  Surgeon: Harvel Quale, MD;  [facility];  Service:  Gastroenterology;;   surgery for left eye bleedf  2017    Social History   Socioeconomic History   Marital status: Married    Spouse name: Not on file   Number of children: Not on file   Years of education: Not on file   Highest education level: Not on file  Occupational History   Not on file  Tobacco Use   Smoking status: Never    Passive exposure: Never   Smokeless tobacco: Never  Vaping Use   Vaping Use: Never used  Substance and Sexual Activity   Alcohol use: No   Drug use: No   Sexual activity: Yes  Other Topics Concern   Not on file  Social History Narrative   Not on file   Social Determinants of Health   Financial Resource Strain: Low Risk  (04/06/2020)   Overall Financial Resource Strain (CARDIA)    Difficulty of  Paying Living Expenses: Not hard at all  Food Insecurity: No Food Insecurity (04/06/2020)   Hunger Vital Sign    Worried About Running Out of Food in the Last Year: Never true    [place] in the Last Year: Never true  Transportation Needs: No Transportation Needs (04/06/2020)   PRAPARE - Hydrologist (Medical): No    Lack of Transportation (Non-Medical): No  Physical Activity: Insufficiently Active (04/06/2020)   Exercise Vital Sign    Days of Exercise per Week: 5 days    Minutes of Exercise per Session: 10 min  Stress: No Stress Concern Present (04/06/2020)   [place]    Feeling of Stress : Not at all  Social Connections: Moderately Isolated (04/06/2020)   Social Connection and Isolation Panel [NHANES]    Frequency of Communication with Friends and Family: More than three times a week    Frequency of Social Gatherings with Friends and Family: More than three times a week    Attends Religious Services: Never    Marine scientist or Organizations: No    Attends Music therapist: Never    Marital Status: Married    Family History  Problem Relation Age of Onset   Diabetes Mother    Hypertension Mother    Hyperlipidemia Mother    Stroke Mother    Heart failure Mother    Diabetes Father    Heart disease Father    Hypertension Father    Hyperlipidemia Father    Heart attack Father    Diabetes Brother    Diabetes Brother    Diabetes Son     Outpatient Encounter Medications as of 01/26/2022  Medication Sig   benazepril (LOTENSIN) 20 MG tablet Take 1 tablet (20 mg total) by mouth daily. (Patient taking differently: Take 20 mg by mouth 3 (three) times a week.)   bismuth subsalicylate (PEPTO BISMOL) 262 MG/15ML suspension Take 30 mLs by mouth every 6 (six) hours as needed for diarrhea or loose stools or indigestion.   blood glucose meter kit and supplies  Dispense based on patient and insurance preference. Use up to three times daily as directed. (dx E11.65).   CALCIUM PO Take 1 tablet by mouth 3 (three) times a week.   Cholecalciferol (VITAMIN D3 PO) Take 1 tablet by mouth 3 (three) times a week.   diphenoxylate-atropine (LOMOTIL) 2.5-0.025 MG tablet Take one tablet by mout htree times weekly as needed, for loose stool (Patient taking differently: Take 1 tablet  by mouth See admin instructions. Take one tablet by mout three times weekly as needed, for loose stool)   donepezil (ARICEPT) 10 MG tablet Take 1 tablet (10 mg total) by mouth at bedtime.   donepezil (ARICEPT) 5 MG tablet Take 5 mg by mouth at bedtime.   dorzolamide-timolol (COSOPT) 22.3-6.8 MG/ML ophthalmic solution Place 1 drop into both eyes 3 (three) times a week.   Insulin Pen Needle (PEN NEEDLES) 31G X 6 MM MISC 1 each by Does not apply route at bedtime.   Iron, Ferrous Sulfate, 325 (65 Fe) MG TABS Take one tablet by mouth once daily (Patient taking differently: Take 325 mg by mouth 3 (three) times a week.)   latanoprost (XALATAN) 0.005 % ophthalmic solution Place 1 drop into both eyes 3 (three) times a week. At bedtime   ONETOUCH VERIO test strip USE AS DIRECTED UP TO 4 TIMES DAILY   pantoprazole (PROTONIX) 40 MG tablet TAKE 1 TABLET BY MOUTH EVERY DAY (Patient taking differently: Take 40 mg by mouth 3 (three) times a week.)   polyethylene glycol-electrolytes (TRILYTE) 420 g solution Take 4,000 mLs by mouth as directed.   Probiotic Product (PROBIOTIC DAILY PO) Take 1 tablet by mouth 3 (three) times a week.   rosuvastatin (CRESTOR) 5 MG tablet Take one tablet by mouth every Monday , Wednesday and Friday (Patient taking differently: Take 5 mg by mouth every Monday, Wednesday, and Friday.)   [DISCONTINUED] insulin degludec (TRESIBA FLEXTOUCH) 100 UNIT/ML FlexTouch Pen Inject 5 Units into the skin at bedtime. (Patient taking differently: Inject 3-5 Units into the skin at bedtime.)    [DISCONTINUED] TRADJENTA 5 MG TABS tablet TAKE 1 TABLET BY MOUTH EVERY DAY (Patient taking differently: Take 5 mg by mouth 3 (three) times a week.)   insulin degludec (TRESIBA FLEXTOUCH) 100 UNIT/ML FlexTouch Pen Inject 5 Units into the skin at bedtime.   linagliptin (TRADJENTA) 5 MG TABS tablet Take 1 tablet (5 mg total) by mouth daily with breakfast.   No facility-administered encounter medications on file as of 01/26/2022.    ALLERGIES: No Known Allergies   VACCINATION STATUS: Immunization History  Administered Date(s) Administered   Fluad Quad(high Dose 65+) 02/27/2019, 01/20/2021   Influenza,inj,Quad PF,6+ Mos 03/17/2013, 03/17/2014, 03/18/2015, 01/04/2016, 12/05/2016, 12/02/2019   Influenza-Unspecified 01/17/2017   Moderna Sars-Covid-2 Vaccination 06/23/2019, 07/21/2019, 02/12/2020   PPD Test 08/16/2010, 08/18/2010, 11/03/2013   Pneumococcal Conjugate-13 11/03/2013   Pneumococcal Polysaccharide-23 12/21/2008   Td 12/21/2008    Diabetes He presents for his follow-up diabetic visit. He has type 2 diabetes mellitus. Onset time: He was diagnosed at approximate age of 41 years. His disease course has been fluctuating. There are no hypoglycemic associated symptoms. Pertinent negatives for hypoglycemia include no confusion, headaches, pallor or seizures. Associated symptoms include blurred vision, fatigue and weight loss. Pertinent negatives for diabetes include no chest pain, no polydipsia, no polyphagia, no polyuria and no weakness. There are no hypoglycemic complications. Symptoms are stable. Diabetic complications include impotence, nephropathy, peripheral neuropathy and PVD. Risk factors for coronary artery disease include diabetes mellitus, dyslipidemia, hypertension, male sex and sedentary lifestyle. Current diabetic treatment includes oral agent (monotherapy) and insulin injections. He is compliant with treatment most of the time. His weight is decreasing steadily. He is following a  generally healthy diet. When asked about meal planning, he reported none. He has not had a previous visit with a dietitian. He never participates in exercise. His home blood glucose trend is fluctuating dramatically. His overall blood glucose range is >200  mg/dl. (He presents today, accompanied by his sister, with his meter showing fluctuating glycemic profile with hyperglycemia overall.  His POCT A1c today is 9.1% improving slightly from last visit.  He reports he has only been taking 3 or 4 units of Tresiba at night and has only been taking the Tradjenta 5 mg every other day.  Analysis of his CGM shows TIR 22%, TAR 78%, TBR 0% with a GMI of 9%.  He denies any hypoglycemia.) An ACE inhibitor/angiotensin II receptor blocker is being taken. He does not see a podiatrist.Eye exam is current.  Hyperlipidemia This is a chronic problem. The current episode started more than 1 year ago. The problem is controlled. Recent lipid tests were reviewed and are normal. Exacerbating diseases include chronic renal disease and diabetes. There are no known factors aggravating his hyperlipidemia. Pertinent negatives include no chest pain, myalgias or shortness of breath. Current antihyperlipidemic treatment includes statins. The current treatment provides moderate improvement of lipids. Compliance problems include adherence to diet.  Risk factors for coronary artery disease include dyslipidemia, diabetes mellitus, hypertension, male sex, a sedentary lifestyle and family history.  Hypertension This is a chronic problem. The current episode started more than 1 year ago. The problem has been resolved since onset. The problem is controlled. Associated symptoms include blurred vision. Pertinent negatives include no chest pain, headaches, neck pain, palpitations or shortness of breath. There are no associated agents to hypertension. Risk factors for coronary artery disease include diabetes mellitus, dyslipidemia, male gender and  sedentary lifestyle. Past treatments include ACE inhibitors. The current treatment provides moderate improvement. There are no compliance problems.  Hypertensive end-organ damage includes kidney disease and PVD. Identifiable causes of hypertension include chronic renal disease.      Review of systems  Constitutional: + steadily decreasing body weight,  current Body mass index is 19.4 kg/m. , no fatigue, no subjective hyperthermia, no subjective hypothermia, + decreased appetite Eyes: no blurry vision, no xerophthalmia ENT: no sore throat, no nodules palpated in throat, no dysphagia/odynophagia, no hoarseness Cardiovascular: no chest pain, no shortness of breath, no palpitations, no leg swelling Respiratory: no cough, no shortness of breath Gastrointestinal: no nausea/vomiting, + chronic diarrhea Musculoskeletal: no muscle/joint aches, reports multiple falls Skin: no rashes, no hyperemia, wound to left lower leg- using dry bandage Neurological: no tremors, no numbness, no tingling, no dizziness Psychiatric: no depression, no anxiety    Objective:    BP 122/67 (BP Location: Left Arm, Patient Position: Sitting, Cuff Size: Normal)   Pulse 69   Ht _0  (1.626 m)   Wt 113 lb (51.3 kg)   BMI 19.40 kg/m   Wt Readings from Last 3 Encounters:  01/26/22 113 lb (51.3 kg)  01/13/22 120 lb (54.4 kg)  01/10/22 (P) 110 lb 3.7 oz (50 kg)    BP Readings from Last 3 Encounters:  01/26/22 122/67  01/13/22 (!) 113/51  01/10/22 (!) (P) 146/71     Physical Exam- Limited  Constitutional:  Body mass index is 19.4 kg/m. , not in acute distress, normal state of mind Eyes:  EOMI, no exophthalmos Neck: Supple Cardiovascular: RRR, no murmurs, rubs, or gallops, no edema Respiratory: Adequate breathing efforts, no crackles, rales, rhonchi, or wheezing Musculoskeletal: missing tips of some fingers, strength intact in all four extremities, no gross restriction of joint movements Skin:  no rashes, no  hyperemia, abrasion to left shin- dressing is stuck to wound- could not visualize wound bed Neurological: no tremor with outstretched hands   Diabetic Foot  Exam - Simple   Simple Foot Form Diabetic Foot exam was performed with the following findings: Yes 01/26/2022  2:45 PM  Visual Inspection See comments: Yes Sensation Testing Intact to touch and monofilament testing bilaterally: Yes Pulse Check Posterior Tibialis and Dorsalis pulse intact bilaterally: Yes Comments Onychomycosis bilaterally     CMP ( most recent) CMP     Component Value Date/Time   NA 138 01/10/2022 1140   NA 137 10/14/2021 0949   K 3.8 01/10/2022 1140   CL 106 01/10/2022 1140   CO2 24 01/10/2022 1140   GLUCOSE 175 (H) 01/10/2022 1140   BUN 17 01/10/2022 1140   BUN 14 10/14/2021 0949   CREATININE 1.20 01/10/2022 1140   CREATININE 1.36 (H) 09/30/2019 1008   CALCIUM 8.3 (L) 01/10/2022 1140   PROT 8.1 10/14/2021 0949   ALBUMIN 4.0 10/14/2021 0949   AST 22 10/14/2021 0949   ALT 9 10/14/2021 0949   ALKPHOS 118 10/14/2021 0949   BILITOT 0.4 10/14/2021 0949   GFRNONAA >60 01/10/2022 1140   GFRNONAA 49 (L) 09/30/2019 1008   GFRAA 59 (L) 03/31/2020 0848   GFRAA 57 (L) 09/30/2019 1008     Diabetic Labs (most recent): Lab Results  Component Value Date   HGBA1C 9.1 (A) 01/26/2022   HGBA1C 9.4 10/18/2021   HGBA1C 7.0 07/07/2021   MICROALBUR 150 07/14/2020   MICROALBUR 8.5 09/23/2018   MICROALBUR 127.6 (H) 06/20/2018     Lipid Panel ( most recent) Lipid Panel     Component Value Date/Time   CHOL 102 10/14/2021 0949   TRIG 40 10/14/2021 0949   HDL 51 10/14/2021 0949   CHOLHDL 2.0 10/14/2021 0949   CHOLHDL 2.6 09/30/2019 1008   VLDL 7 11/27/2016 1000   LDLCALC 40 10/14/2021 0949   LDLCALC 58 09/30/2019 1008   LABVLDL 11 10/14/2021 0949      Lab Results  Component Value Date   TSH 0.905 03/29/2021   TSH 1.300 03/31/2020   TSH 1.77 09/23/2018   TSH 0.61 07/19/2015   TSH 0.952 03/18/2015    TSH 0.921 12/11/2013   TSH 1.392 10/29/2012   TSH 0.615 09/26/2011   TSH 1.573 12/21/2008   FREET4 1.26 03/31/2020      Assessment & Plan:   1) Uncontrolled type 2 diabetes mellitus with hyperglycemia ([place])  - Krithik ALYSSA MANCERA has currently uncontrolled symptomatic type 2 DM since  81 years of age.  He presents today, accompanied by his sister, with his meter showing fluctuating glycemic profile with hyperglycemia overall.  His POCT A1c today is 9.1% improving slightly from last visit.  He reports he has only been taking 3 or 4 units of Tresiba at night and has only been taking the Tradjenta 5 mg every other day.  Analysis of his CGM shows TIR 22%, TAR 78%, TBR 0% with a GMI of 9%.  He denies any hypoglycemia.   Recent labs reviewed.  - I had a long discussion with him about the progressive nature of diabetes and the pathology behind its complications. -his diabetes is complicated by peripheral arterial disease and he remains at a high risk for more acute and chronic complications which include CAD, CVA, CKD, retinopathy, and neuropathy. These are all discussed in detail with him.  - Nutritional counseling repeated at each appointment due to patients tendency to fall back in to old habits.  - The patient admits there is a room for improvement in their diet and drink choices. -  Suggestion is made  for the patient to avoid simple carbohydrates from their diet including Cakes, Sweet Desserts / Pastries, Ice Cream, Soda (diet and regular), Sweet Tea, Candies, Chips, Cookies, Sweet Pastries, Store Bought Juices, Alcohol in Excess of 1-2 drinks a day, Artificial Sweeteners, Coffee Creamer, and "Sugar-free" Products. This will help patient to have stable blood glucose profile and potentially avoid unintended weight gain.   - I encouraged the patient to switch to unprocessed or minimally processed complex starch and increased protein intake (animal or plant source), fruits, and vegetables.   -  Patient is advised to stick to a routine mealtimes to eat 3 meals a day and avoid unnecessary snacks (to snack only to correct hypoglycemia).  -I did give some suggestions for plant based protein shakes as the whey protein in the products he has tried is known to cause some GI distress if sensitive to lactose.  - I have approached him with the following individualized plan to manage  his diabetes and patient agrees:   -In light of his advanced age and light body weight, avoiding hypoglycemia should be the number 1 priority in his care.   -He is advised to continue his prescribed amount of Tresiba 5 units SQ nightly (not adjusting his dose) and continue his Tradjenta 5 mg po daily (not every other day).    -He is encouraged to continue monitoring blood glucose at least twice daily (using his CGM), before breakfast and before bed, and to call the clinic if he has readings less than 70 or above 300 for 3 tests in a row.    - he is not a candidate for Metformin due to concurrent renal insufficiency.  - he is not a candidate for incretin therapy due to his body habitus.   - Specific targets for  A1c;  LDL, HDL,  and Triglycerides were discussed with the patient.  2) Blood Pressure /Hypertension:   his blood pressure is controlled to target for his age.   he is advised to continue his current medications including Benazepril 10 mg p.o. daily with breakfast .  3) Lipids/Hyperlipidemia:    His most recent lipid panel from 10/14/21 shows controlled LDL at 40.  He is advised to continue Crestor 5 mg po daily at bedtime.  Side effects and precautions discussed with him.  4)  Weight/Diet:  His Body mass index is 19.4 kg/m.   he is not a candidate for weight loss.   Exercise, and detailed carbohydrates information provided  -  detailed on discharge instructions.  5) Osteopenia: -He will not need bisphosphonate intervention at this time.  He is advised to continue on his Vitamin D 4000 units daily, as  well as Calcium carbonate 600 mg p.o. daily.  6) Chronic Care/Health Maintenance: -he is on ACEI/ARB and Statin medications and is encouraged to initiate and continue to follow up with Ophthalmology, Dentist, Podiatrist at least yearly or according to recommendations, and advised to stay away from smoking. I have recommended yearly flu vaccine and pneumonia vaccine at least every 5 years; moderate intensity exercise for up to 150 minutes weekly; and  sleep for at least 7 hours a day.  - he is advised to maintain close follow up with Fayrene Helper, MD for primary care needs, as well as his other providers for optimal and coordinated care.     I spent 41 minutes in the care of the patient today including review of labs from CMP, Lipids, Thyroid Function, Hematology (current and previous including abstractions from  other facilities); face-to-face time discussing  his blood glucose readings/logs, discussing hypoglycemia and hyperglycemia episodes and symptoms, medications doses, his options of short and long term treatment based on the latest standards of care / guidelines;  discussion about incorporating lifestyle medicine;  and documenting the encounter. Risk reduction counseling performed per USPSTF guidelines to reduce obesity and cardiovascular risk factors.     Please refer to Patient Instructions for Blood Glucose Monitoring and Insulin/Medications Dosing Guide"  in media tab for additional information. Please  also refer to " Patient Self Inventory" in the Media  tab for reviewed elements of pertinent patient history.  Rhett Bannister participated in the discussions, expressed understanding, and voiced agreement with the above plans.  All questions were answered to his satisfaction. he is encouraged to contact clinic should he have any questions or concerns prior to his return visit.   Follow up plan: - Return in about 3 months (around 04/28/2022) for Diabetes F/U with A1c in office, No  previsit labs, Bring meter and logs.  Rayetta Pigg, [facility] [address] [place], [place] [address] Phone: [phone] Fax: [phone]  01/26/2022, 2:49 PM

## 2022-02-07 ENCOUNTER — Telehealth: Payer: Self-pay

## 2022-02-07 NOTE — Telephone Encounter (Signed)
Tiffany Called from lab cop[facility] in [place].  fax # [phone] needs lab orders faxed ASAP.

## 2022-02-07 NOTE — Telephone Encounter (Signed)
No labs needed [facility] aware

## 2022-02-16 ENCOUNTER — Encounter: Payer: Self-pay | Admitting: Family Medicine

## 2022-02-16 ENCOUNTER — Ambulatory Visit (INDEPENDENT_AMBULATORY_CARE_PROVIDER_SITE_OTHER): Payer: Medicare HMO | Admitting: Family Medicine

## 2022-02-16 VITALS — BP 150/70 | HR 74 | Ht 64.0 in | Wt 113.0 lb

## 2022-02-16 DIAGNOSIS — I10 Essential (primary) hypertension: Secondary | ICD-10-CM | POA: Diagnosis not present

## 2022-02-16 DIAGNOSIS — E1159 Type 2 diabetes mellitus with other circulatory complications: Secondary | ICD-10-CM

## 2022-02-16 DIAGNOSIS — Z9181 History of falling: Secondary | ICD-10-CM | POA: Diagnosis not present

## 2022-02-16 DIAGNOSIS — Z23 Encounter for immunization: Secondary | ICD-10-CM

## 2022-02-16 DIAGNOSIS — R197 Diarrhea, unspecified: Secondary | ICD-10-CM

## 2022-02-16 MED ORDER — DIPHENOXYLATE-ATROPINE 2.5-0.025 MG PO TABS: ORAL_TABLET | ORAL | 0 refills | Status: DC

## 2022-02-16 MED ORDER — BENAZEPRIL HCL 20 MG PO TABS: 20.0000 mg | ORAL_TABLET | Freq: Every day | ORAL | 5 refills | Status: DC

## 2022-02-16 NOTE — Patient Instructions (Signed)
Follow-up in 10 weeks call if you need me sooner.  Flu vaccine in office today.  Please commit to taking your blood pressure medicine 1 tablet once every day, your blood pressure is high and this needs to be controlled.  Take 1 Lomotil tablet once or twice daily as needed for loose stools.  This should help the diarrhea.  Fasting lipid CMP and EGFR 5 days before next visit.  Stay off of beef and pork as directed and only eat  turkey and chicken until you return to your GI specialist.  Thanks for choosing [facility], we consider it a privelige to serve you.  

## 2022-02-17 ENCOUNTER — Telehealth: Payer: Self-pay | Admitting: Family Medicine

## 2022-02-17 NOTE — Telephone Encounter (Signed)
Hailey from [facility] called needs a new form sent back to her with the patient date of birth listed on the form that she received this morning. Phone #  [phone]  fax # [phone]

## 2022-02-17 NOTE — Telephone Encounter (Signed)
re-faxed

## 2022-02-19 ENCOUNTER — Encounter: Payer: Self-pay | Admitting: Family Medicine

## 2022-02-19 NOTE — Assessment & Plan Note (Signed)
Uncontrolled, n managed by Endo, encouraged to comply with diet and medication Jacob Barnes is reminded of the importance of commitment to daily physical activity for 30 minutes or more, as able and the need to limit carbohydrate intake to 30 to 60 grams per meal to help with blood sugar control.   The need to take medication as prescribed, test blood sugar as directed, and to call between visits if there is a concern that blood sugar is uncontrolled is also discussed.   Jacob Barnes is reminded of the importance of daily foot exam, annual eye examination, and good blood sugar, blood pressure and cholesterol control.     Latest Ref Rng & Units 01/26/2022    2:34 PM 01/10/2022   11:40 AM 10/18/2021    2:13 PM 10/14/2021    9:49 AM 07/07/2021    2:06 PM  Diabetic Labs  HbA1c 4.0 - 5.6 % 9.1   9.4   7.0   Chol 100 - 199 mg/dL    102    HDL >39 mg/dL    51    Calc LDL 0 - 99 mg/dL    40    Triglycerides 0 - 149 mg/dL    40    Creatinine 0.61 - 1.24 mg/dL  1.20   1.19        02/16/2022    1:44 PM 02/16/2022    1:18 PM 02/16/2022    1:07 PM 01/26/2022    2:14 PM 01/13/2022    8:20 AM 01/13/2022    6:39 AM 01/10/2022   11:39 AM  BP/Weight  Systolic BP 403 474 259 [phone]  Diastolic BP 70 70 75 67 51 83 71  Wt. (Lbs)   113 113  120 110.23  BMI   19.4 kg/m2 19.4 kg/m2  19.97 kg/m2 18.92 kg/m2      01/26/2022    2:00 PM 02/27/2019    9:00 AM  Foot/eye exam completion dates  Foot Form Completion Done Done

## 2022-02-19 NOTE — Assessment & Plan Note (Signed)
Uncontrolled , not taking med consistently, but agrees to change this DASH diet and commitment to daily physical activity for a minimum of 30 minutes discussed and encouraged, as a part of hypertension management. The importance of attaining a healthy weight is also discussed.     02/16/2022    1:44 PM 02/16/2022    1:18 PM 02/16/2022    1:07 PM 01/26/2022    2:14 PM 01/13/2022    8:20 AM 01/13/2022    6:39 AM 01/10/2022   11:39 AM  BP/Weight  Systolic BP 284 132 440 102 725 366 440  Diastolic BP 70 70 75 67 51 83 71  Wt. (Lbs)   113 113  120 110.23  BMI   19.4 kg/m2 19.4 kg/m2  19.97 kg/m2 18.92 kg/m2

## 2022-02-19 NOTE — Progress Notes (Signed)
Jacob Barnes     MRN: [id number]      DOB: 05-Dec-1940   HPI [name] is here for follow up and re-evaluation of chronic medical conditions, medication management and review of any available recent lab and radiology data.  Preventive health is updated, specifically  Cancer screening and Immunization.   C/o uncontrolled diarrheah which perisits and is being investigated by GI, not taking the lomotil prescribed and needs to start, is avoiding meats as recommended The PT denies any adverse reactions to current medications since the last visit.  Not consistently taking his BP medication Denies polyuria, polydipsia, blurred vision , or hypoglycemic episodes.    ROS Denies recent fever or chills. Denies sinus pressure, nasal congestion, ear pain or sore throat. Denies chest congestion, productive cough or wheezing. Denies chest pains, palpitations and leg swelling    Denies dysuria, frequency, hesitancy or incontinence. Denies joint pain, swelling and limitation in mobility. Denies headaches, seizures, numbness, or tingling. Denies depression, anxiety or insomnia. Denies skin break down or rash.   PE  BP (!) 150/70   Pulse 74   Ht '5\' 4"'$  (1.626 m)   Wt 113 lb (51.3 kg)   SpO2 96%   BMI 19.40 kg/m   Patient alert and oriented and in no cardiopulmonary distress.  HEENT: No facial asymmetry, EOMI,     Neck supple .  Chest: Clear to auscultation bilaterally.  CVS: S1, S2 no murmurs, no S3.Regular rate.  ABD: Soft non tender.   Ext: No edema  MS: decreased  ROM spine, shoulders, hips and knees.  Skin: Intact, no ulcerations or rash noted.  Psych: Good eye contact, normal affect. Memory intact not anxious or depressed appearing.  CNS: CN 2-12 intact, power,  normal throughout.no focal deficits noted.   Assessment & Plan  Essential hypertension, benign Uncontrolled , not taking med consistently, but agrees to change this DASH diet and commitment to daily physical  activity for a minimum of 30 minutes discussed and encouraged, as a part of hypertension management. The importance of attaining a healthy weight is also discussed.     02/16/2022    1:44 PM 02/16/2022    1:18 PM 02/16/2022    1:07 PM 01/26/2022    2:14 PM 01/13/2022    8:20 AM 01/13/2022    6:39 AM 01/10/2022   11:39 AM  BP/Weight  Systolic BP 400 867 619 509 326 712 458  Diastolic BP 70 70 75 67 51 83 71  Wt. (Lbs)   113 113  120 110.23  BMI   19.4 kg/m2 19.4 kg/m2  19.97 kg/m2 18.92 kg/m2       Type 2 diabetes mellitus with vascular disease (HCC) Uncontrolled, n managed by Endo, encouraged to comply with diet and medication [name] is reminded of the importance of commitment to daily physical activity for 30 minutes or more, as able and the need to limit carbohydrate intake to 30 to 60 grams per meal to help with blood sugar control.   The need to take medication as prescribed, test blood sugar as directed, and to call between visits if there is a concern that blood sugar is uncontrolled is also discussed.   [name] is reminded of the importance of daily foot exam, annual eye examination, and good blood sugar, blood pressure and cholesterol control.     Latest Ref Rng & Units 01/26/2022    2:34 PM 01/10/2022   11:40 AM 10/18/2021    2:13 PM 10/14/2021  9:49 AM 07/07/2021    2:06 PM  Diabetic Labs  HbA1c 4.0 - 5.6 % 9.1   9.4   7.0   Chol 100 - 199 mg/dL    102    HDL >39 mg/dL    51    Calc LDL 0 - 99 mg/dL    40    Triglycerides 0 - 149 mg/dL    40    Creatinine 0.61 - 1.24 mg/dL  1.20   1.19        02/16/2022    1:44 PM 02/16/2022    1:18 PM 02/16/2022    1:07 PM 01/26/2022    2:14 PM 01/13/2022    8:20 AM 01/13/2022    6:39 AM 01/10/2022   11:39 AM  BP/Weight  Systolic BP 606 301 601 093 235 573 220  Diastolic BP 70 70 75 67 51 83 71  Wt. (Lbs)   113 113  120 110.23  BMI   19.4 kg/m2 19.4 kg/m2  19.97 kg/m2 18.92 kg/m2      01/26/2022    2:00 PM 02/27/2019     9:00 AM  Foot/eye exam completion dates  Foot Form Completion Done Done        At high risk for falls Home safety reviewed  Diarrhea Persistent, encouraged to use lomotil for symptom relief, and to keep GI follow up

## 2022-02-19 NOTE — Assessment & Plan Note (Signed)
Home safety reviewed 

## 2022-02-19 NOTE — Assessment & Plan Note (Signed)
Persistent, encouraged to use lomotil for symptom relief, and to keep GI follow up

## 2022-02-20 ENCOUNTER — Encounter (INDEPENDENT_AMBULATORY_CARE_PROVIDER_SITE_OTHER): Payer: Self-pay | Admitting: Gastroenterology

## 2022-02-27 ENCOUNTER — Ambulatory Visit (INDEPENDENT_AMBULATORY_CARE_PROVIDER_SITE_OTHER): Payer: Medicare HMO | Admitting: Gastroenterology

## 2022-03-09 ENCOUNTER — Encounter (INDEPENDENT_AMBULATORY_CARE_PROVIDER_SITE_OTHER): Payer: Self-pay

## 2022-03-09 ENCOUNTER — Ambulatory Visit (INDEPENDENT_AMBULATORY_CARE_PROVIDER_SITE_OTHER): Payer: Medicare HMO | Admitting: Gastroenterology

## 2022-03-13 ENCOUNTER — Ambulatory Visit (INDEPENDENT_AMBULATORY_CARE_PROVIDER_SITE_OTHER): Payer: Medicare HMO | Admitting: Gastroenterology

## 2022-03-13 ENCOUNTER — Encounter (INDEPENDENT_AMBULATORY_CARE_PROVIDER_SITE_OTHER): Payer: Self-pay | Admitting: Gastroenterology

## 2022-03-13 VITALS — BP 127/67 | HR 80 | Temp 97.8°F | Ht 64.0 in | Wt 114.9 lb

## 2022-03-13 DIAGNOSIS — R197 Diarrhea, unspecified: Secondary | ICD-10-CM

## 2022-03-13 DIAGNOSIS — Z91018 Allergy to other foods: Secondary | ICD-10-CM

## 2022-03-13 NOTE — Patient Instructions (Signed)
Please continue to avoid red meat and pork You can use imodium as needed Let me know if you have any new or worsening symptoms  Follow up 6 months

## 2022-03-13 NOTE — Progress Notes (Signed)
Referring Provider: Fayrene Helper, MD Primary Care Physician:  Fayrene Helper, MD Primary GI Physician: Jenetta Downer  Chief Complaint  Patient presents with   Diarrhea    Patient arrives with sister Jacob Barnes for a follow up. Doing better with diarrhea since avoiding red meat.    HPI:   Jacob Barnes is a 81 y.o. male with past medical history of anemia, arthritis, carpal tunnel, DM, ED, GERD, glaucoma, HLD, HTN, IDA, pancytopenia, hx gastric ulcers   Patient presenting today for follow up of diarrhea  Last seen in August with diarrhea x1 year occurring almost everytime he ate. No abdominal pain. Noted some darker stools at times. Taking pepto bismol some for diarrhea. Having some fecal incontinence as well. lab testing revealed presence of alpha gal. Negative celiac. Scheduled for colonoscopy and recommended to avoid red meat and pork  Present: Patient here with his sister who provides some history. States that he had been avoiding red meat and pork until recently over thanksgiving when he consumed some red meat and pork and had some diarrhea. He is having a BM usually once daily and stools are more solid now. He notes some occasional fecal urgency and occasional loose stools though feels that symptoms are improved since avoiding red meats. He takes imodium as needed. He denies any abdominal pain. Appetite is good. Denies rectal bleeding or melena.   Last Colonoscopy:10/2023Three 3 to 6 mm polyps in the rectum, in the descending colon and in the transverse - The rest of the examined colon is normal. Biopsied. - The distal rectum and anal verge are normal on retroflexion view. 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   Anemia    Arthritis    hands   Carpal tunnel syndrome 08/05/2009   Qualifier:  Diagnosis of  By: Aline Brochure MD, [name]     Chronic kidney disease    Diabetes mellitus    says since 1979 type 2   Diabetes mellitus without complication ([place])    Phreesia 04/03/2020   ERECTILE DYSFUNCTION, ORGANIC 01/20/2009   Qualifier: Diagnosis of  By: Moshe Cipro MD, Margaret     GERD (gastroesophageal reflux disease)    Glaucoma    both eyes   History of kidney stones    Hyperlipemia    Hypertension    Iron deficiency anemia 01/10/2014   Other pancytopenia ([place]) 01/10/2014   New in 01/2014, undergoing pathology review    Rash    front abdomen no drainage    Past Surgical History:  Procedure Laterality Date   AMPUTATION Right 09/01/2016   Procedure: AMPUTATION RIGHT MIDDLE FINGER TIP;  Surgeon: Mcarthur Rossetti, MD;  Location: [facility];  Service: Orthopedics;  Laterality: Right;   AMPUTATION Right 10/10/2016   Procedure: REPEAT IRRIGATION AND DEBRIDEMENT RIGHT MIDDLE FINGER WITH AMPUTATION THROUGH PROXIMAL PHALANX;  Surgeon: Mcarthur Rossetti, MD;  Location: [place];  Service: Orthopedics;  Laterality: Right;   APPENDECTOMY     BIOPSY  01/13/2022   Procedure: BIOPSY;  Surgeon: Harvel Quale, MD;  Location: [facility];  Service: Gastroenterology;;   COLONOSCOPY  09/06/2011   Procedure: COLONOSCOPY;  Surgeon: Rogene Houston, MD;  Location: [facility];  Service: Endoscopy;  Laterality: N/A;  830   COLONOSCOPY WITH PROPOFOL  N/A 01/13/2022   Procedure: COLONOSCOPY WITH PROPOFOL;  Surgeon: Harvel Quale, MD;  Location: [facility];  Service: Gastroenterology;  Laterality: N/A;  200 ASA 3   CYSTOSCOPY/RETROGRADE/URETEROSCOPY/STONE EXTRACTION WITH BASKET     ESOPHAGOGASTRODUODENOSCOPY N/A 01/15/2014   Procedure: ESOPHAGOGASTRODUODENOSCOPY (EGD);  Surgeon: Rogene Houston, MD;  [facility];  Service: Endoscopy;  Laterality: N/A;  200   ESOPHAGOGASTRODUODENOSCOPY N/A 04/22/2014   Procedure: ESOPHAGOGASTRODUODENOSCOPY (EGD);  Surgeon: Rogene Houston, MD;  [facility];  Service: Endoscopy;  Laterality: N/A;  240   EYE SURGERY Bilateral yrs ago   ioc for cataract   MALONEY DILATION N/A 01/15/2014   Procedure: MALONEY DILATION;  Surgeon: Rogene Houston, MD;  [facility];  Service: Endoscopy;  Laterality: N/A;   MALONEY DILATION  04/22/2014   Procedure: Venia Minks DILATION;  Surgeon: Rogene Houston, MD;  [facility];  Service: Endoscopy;;   POLYPECTOMY  01/13/2022   Procedure: POLYPECTOMY;  Surgeon: Montez Morita, Quillian Quince, MD;  Location: [facility];  Service: Gastroenterology;;   surgery for left eye bleedf  2017    Current Outpatient Medications  Medication Sig Dispense Refill   benazepril (LOTENSIN) 20 MG tablet Take 1 tablet (20 mg total) by mouth daily. 30 tablet 5   blood glucose meter kit and supplies Dispense based on patient and insurance preference. Use up to three times daily as directed. (dx E11.65). 1 each 0   CALCIUM PO Take 1 tablet by mouth 3 (three) times a week.     Cholecalciferol (VITAMIN D3 PO) Take 1 tablet by mouth 3 (three) times a week.     diphenoxylate-atropine (LOMOTIL) 2.5-0.025 MG tablet Take one tablet by mouth three times daily, as needed, for loose stool 50 tablet 0   donepezil (ARICEPT) 10 MG tablet Take 1 tablet (10 mg total) by mouth at bedtime. 30 tablet 3   dorzolamide-timolol (COSOPT) 22.3-6.8 MG/ML ophthalmic solution Place 1 drop into both eyes 3 (three) times a week.     insulin degludec (TRESIBA FLEXTOUCH) 100 UNIT/ML FlexTouch Pen Inject 5 Units into the skin at bedtime. 6 mL 3   Insulin Pen Needle (PEN NEEDLES) 31G X 6 MM MISC 1 each by Does not apply route at bedtime. 100 each 2   Iron, Ferrous Sulfate, 325 (65 Fe) MG TABS Take one tablet by mouth once daily (Patient taking differently: Take 325 mg by mouth 3 (three) times a week.) 90 tablet 3   latanoprost (XALATAN) 0.005 % ophthalmic solution Place 1 drop into both eyes 3 (three) times a week. At  bedtime     linagliptin (TRADJENTA) 5 MG TABS tablet Take 1 tablet (5 mg total) by mouth daily with breakfast. 90 tablet 1   ONETOUCH VERIO test strip USE AS DIRECTED UP TO 4 TIMES DAILY 100 strip 2   pantoprazole (PROTONIX) 40 MG tablet TAKE 1 TABLET BY MOUTH EVERY DAY (Patient not taking: Reported on 03/13/2022) 90 tablet 1   rosuvastatin (CRESTOR) 5 MG tablet Take one tablet by mouth every Monday , Wednesday and Friday (Patient not taking: Reported on 03/13/2022) 36 tablet 3   No current facility-administered medications for this visit.    Allergies as of 03/13/2022 - Review Complete 03/13/2022  Allergen Reaction Noted   Alpha-gal  03/13/2022    Family History  Problem Relation Age of Onset   Diabetes Mother    Hypertension Mother    Hyperlipidemia Mother    Stroke Mother  Heart failure Mother    Diabetes Father    Heart disease Father    Hypertension Father    Hyperlipidemia Father    Heart attack Father    Diabetes Brother    Diabetes Brother    Diabetes Son     Social History   Socioeconomic History   Marital status: Married    Spouse name: Not on file   Number of children: Not on file   Years of education: Not on file   Highest education level: Not on file  Occupational History   Not on file  Tobacco Use   Smoking status: Never    Passive exposure: Never   Smokeless tobacco: Never  Vaping Use   Vaping Use: Never used  Substance and Sexual Activity   Alcohol use: No   Drug use: No   Sexual activity: Yes  Other Topics Concern   Not on file  Social History Narrative   Not on file   Social Determinants of Health   Financial Resource Strain: Low Risk  (04/06/2020)   Overall Financial Resource Strain (CARDIA)    Difficulty of Paying Living Expenses: Not hard at all  Food Insecurity: No Food Insecurity (04/06/2020)   Hunger Vital Sign    Worried About Running Out of Food in the Last Year: Never true    [place] in the Last Year: Never true   Transportation Needs: No Transportation Needs (04/06/2020)   PRAPARE - Hydrologist (Medical): No    Lack of Transportation (Non-Medical): No  Physical Activity: Insufficiently Active (04/06/2020)   Exercise Vital Sign    Days of Exercise per Week: 5 days    Minutes of Exercise per Session: 10 min  Stress: No Stress Concern Present (04/06/2020)   [place]    Feeling of Stress : Not at all  Social Connections: Moderately Isolated (04/06/2020)   Social Connection and Isolation Panel [NHANES]    Frequency of Communication with Friends and Family: More than three times a week    Frequency of Social Gatherings with Friends and Family: More than three times a week    Attends Religious Services: Never    Marine scientist or Organizations: No    Attends Music therapist: Never    Marital Status: Married    Review of systems General: negative for malaise, night sweats, fever, chills, weight loss Neck: Negative for lumps, goiter, pain and significant neck swelling Resp: Negative for cough, wheezing, dyspnea at rest CV: Negative for chest pain, leg swelling, palpitations, orthopnea GI: denies melena, hematochezia, nausea, vomiting, diarrhea, constipation, dysphagia, odyonophagia, early satiety or unintentional weight loss.  MSK: Negative for joint pain or swelling, back pain, and muscle pain. Derm: Negative for itching or rash Psych: Denies depression, anxiety, memory loss, confusion. No homicidal or suicidal ideation.  Heme: Negative for prolonged bleeding, bruising easily, and swollen nodes. Endocrine: Negative for cold or heat intolerance, polyuria, polydipsia and goiter. Neuro: negative for tremor, gait imbalance, syncope and seizures. The remainder of the review of systems is noncontributory.  Physical Exam: BP 127/67 (BP Location: Left Arm, Patient Position: Sitting,  Cuff Size: Normal)   Pulse 80   Temp 97.8 F (36.6 C) (Oral)   Ht _0  (1.626 m)   Wt 114 lb 14.4 oz (52.1 kg)   BMI 19.72 kg/m  General:   Alert and oriented. No distress noted. Pleasant and cooperative.  Head:  Normocephalic and atraumatic. Eyes:  Conjuctiva clear without scleral icterus. Mouth:  Oral mucosa pink and moist. Good dentition. No lesions. Heart: Normal rate and rhythm, s1 and s2 heart sounds present.  Lungs: Clear lung sounds in all lobes. Respirations equal and unlabored. Abdomen:  +BS, soft, non-tender and non-distended. No rebound or guarding. No HSM or masses noted. Derm: No palmar erythema or jaundice Msk:  Symmetrical without gross deformities. Normal posture. Extremities:  Without edema. Neurologic:  Alert and  oriented x4 Psych:  Alert and cooperative. Normal mood and affect.  Invalid input(s): "6 MONTHS"   ASSESSMENT: Jacob Barnes is a 81 y.o. male presenting today for follow up of diarrhea.  Previously with diarrhea with testing revealing presence of alpha gal. Symptoms have improved since avoiding red meat and pork. He did consume some over thanksgiving and noted some diarrhea. Has mostly formed stools now with no abdominal pain, rectal bleeding or melena. No rashes, swelling or sob occur with eating meat, however, I encouraged him to continue to avoid red meats/lamb and pork altogether. Colonoscopy in October with only findings of tubular adenomas. He can continue to use imodium as needed. May consider rechecking alpha gal panel at follow up   PLAN:  Continue to avoid red meat/lamb, pork 2. Use imodium as needed  3. Pt to make me aware of new or worsening GI symptoms  4. Consider repeat alpha gal testing at next f/u  All questions were answered, patient verbalized understanding and is in agreement with plan as outlined above.   Follow Up: 6 months   Jacob Barnes L. Alver Sorrow, MSN, APRN, AGNP-C Adult-Gerontology Nurse Practitioner [facility]  Gastroenterology at [facility]  I have reviewed the note and agree with the APP's assessment as described in this progress note  Maylon Peppers, MD Gastroenterology and Hepatology [facility]

## 2022-03-16 ENCOUNTER — Other Ambulatory Visit: Payer: Self-pay | Admitting: Family Medicine

## 2022-03-16 NOTE — Telephone Encounter (Signed)
Please send electronically if you would like  

## 2022-03-19 ENCOUNTER — Inpatient Hospital Stay (HOSPITAL_COMMUNITY): Payer: Medicare HMO

## 2022-03-19 ENCOUNTER — Encounter (HOSPITAL_COMMUNITY): Payer: Self-pay

## 2022-03-19 ENCOUNTER — Encounter (HOSPITAL_COMMUNITY): Payer: Self-pay | Admitting: *Deleted

## 2022-03-19 ENCOUNTER — Emergency Department (HOSPITAL_COMMUNITY): Payer: Medicare HMO

## 2022-03-19 ENCOUNTER — Inpatient Hospital Stay (HOSPITAL_COMMUNITY)
Admission: EM | Admit: 2022-03-19 | Discharge: 2022-03-28 | DRG: 020 | Disposition: A | Discharge status: Skilled Nursing Facility | Payer: Medicare HMO | Attending: Internal Medicine | Admitting: Internal Medicine

## 2022-03-19 ENCOUNTER — Other Ambulatory Visit: Payer: Self-pay

## 2022-03-19 DIAGNOSIS — Z79899 Other long term (current) drug therapy: Secondary | ICD-10-CM

## 2022-03-19 DIAGNOSIS — I6203 Nontraumatic chronic subdural hemorrhage: Secondary | ICD-10-CM | POA: Diagnosis present

## 2022-03-19 DIAGNOSIS — S80811A Abrasion, right lower leg, initial encounter: Secondary | ICD-10-CM | POA: Diagnosis present

## 2022-03-19 DIAGNOSIS — R4701 Aphasia: Secondary | ICD-10-CM | POA: Diagnosis present

## 2022-03-19 DIAGNOSIS — Z794 Long term (current) use of insulin: Secondary | ICD-10-CM

## 2022-03-19 DIAGNOSIS — E1165 Type 2 diabetes mellitus with hyperglycemia: Secondary | ICD-10-CM | POA: Diagnosis present

## 2022-03-19 DIAGNOSIS — R414 Neurologic neglect syndrome: Secondary | ICD-10-CM | POA: Diagnosis present

## 2022-03-19 DIAGNOSIS — S065XAA Traumatic subdural hemorrhage with loss of consciousness status unknown, initial encounter: Secondary | ICD-10-CM | POA: Diagnosis present

## 2022-03-19 DIAGNOSIS — E119 Type 2 diabetes mellitus without complications: Secondary | ICD-10-CM | POA: Diagnosis not present

## 2022-03-19 DIAGNOSIS — N183 Chronic kidney disease, stage 3 unspecified: Secondary | ICD-10-CM | POA: Diagnosis not present

## 2022-03-19 DIAGNOSIS — N179 Acute kidney failure, unspecified: Secondary | ICD-10-CM | POA: Diagnosis present

## 2022-03-19 DIAGNOSIS — I129 Hypertensive chronic kidney disease with stage 1 through stage 4 chronic kidney disease, or unspecified chronic kidney disease: Secondary | ICD-10-CM | POA: Diagnosis present

## 2022-03-19 DIAGNOSIS — E1122 Type 2 diabetes mellitus with diabetic chronic kidney disease: Secondary | ICD-10-CM | POA: Diagnosis present

## 2022-03-19 DIAGNOSIS — N529 Male erectile dysfunction, unspecified: Secondary | ICD-10-CM | POA: Diagnosis present

## 2022-03-19 DIAGNOSIS — G936 Cerebral edema: Secondary | ICD-10-CM | POA: Diagnosis present

## 2022-03-19 DIAGNOSIS — I10 Essential (primary) hypertension: Secondary | ICD-10-CM | POA: Diagnosis present

## 2022-03-19 DIAGNOSIS — E785 Hyperlipidemia, unspecified: Secondary | ICD-10-CM | POA: Diagnosis present

## 2022-03-19 DIAGNOSIS — Z823 Family history of stroke: Secondary | ICD-10-CM

## 2022-03-19 DIAGNOSIS — R471 Dysarthria and anarthria: Secondary | ICD-10-CM | POA: Diagnosis present

## 2022-03-19 DIAGNOSIS — D631 Anemia in chronic kidney disease: Secondary | ICD-10-CM | POA: Diagnosis present

## 2022-03-19 DIAGNOSIS — R739 Hyperglycemia, unspecified: Secondary | ICD-10-CM

## 2022-03-19 DIAGNOSIS — F03A0 Unspecified dementia, mild, without behavioral disturbance, psychotic disturbance, mood disturbance, and anxiety: Secondary | ICD-10-CM | POA: Diagnosis present

## 2022-03-19 DIAGNOSIS — Z7189 Other specified counseling: Secondary | ICD-10-CM | POA: Diagnosis not present

## 2022-03-19 DIAGNOSIS — K219 Gastro-esophageal reflux disease without esophagitis: Secondary | ICD-10-CM | POA: Diagnosis present

## 2022-03-19 DIAGNOSIS — Z888 Allergy status to other drugs, medicaments and biological substances status: Secondary | ICD-10-CM

## 2022-03-19 DIAGNOSIS — Z7984 Long term (current) use of oral hypoglycemic drugs: Secondary | ICD-10-CM

## 2022-03-19 DIAGNOSIS — I6201 Nontraumatic acute subdural hemorrhage: Secondary | ICD-10-CM | POA: Diagnosis present

## 2022-03-19 DIAGNOSIS — R29707 NIHSS score 7: Secondary | ICD-10-CM | POA: Diagnosis present

## 2022-03-19 DIAGNOSIS — H409 Unspecified glaucoma: Secondary | ICD-10-CM | POA: Diagnosis present

## 2022-03-19 DIAGNOSIS — Z833 Family history of diabetes mellitus: Secondary | ICD-10-CM

## 2022-03-19 DIAGNOSIS — Z515 Encounter for palliative care: Secondary | ICD-10-CM | POA: Diagnosis not present

## 2022-03-19 DIAGNOSIS — J9601 Acute respiratory failure with hypoxia: Secondary | ICD-10-CM | POA: Diagnosis present

## 2022-03-19 DIAGNOSIS — R0902 Hypoxemia: Secondary | ICD-10-CM | POA: Diagnosis not present

## 2022-03-19 DIAGNOSIS — E876 Hypokalemia: Secondary | ICD-10-CM | POA: Diagnosis not present

## 2022-03-19 DIAGNOSIS — N1831 Chronic kidney disease, stage 3a: Secondary | ICD-10-CM | POA: Diagnosis present

## 2022-03-19 DIAGNOSIS — M1990 Unspecified osteoarthritis, unspecified site: Secondary | ICD-10-CM | POA: Diagnosis present

## 2022-03-19 DIAGNOSIS — G8191 Hemiplegia, unspecified affecting right dominant side: Secondary | ICD-10-CM | POA: Diagnosis present

## 2022-03-19 DIAGNOSIS — G9341 Metabolic encephalopathy: Secondary | ICD-10-CM | POA: Diagnosis present

## 2022-03-19 DIAGNOSIS — Z8249 Family history of ischemic heart disease and other diseases of the circulatory system: Secondary | ICD-10-CM

## 2022-03-19 DIAGNOSIS — Z89021 Acquired absence of right finger(s): Secondary | ICD-10-CM

## 2022-03-19 DIAGNOSIS — F0390 Unspecified dementia without behavioral disturbance: Secondary | ICD-10-CM | POA: Diagnosis present

## 2022-03-19 DIAGNOSIS — R262 Difficulty in walking, not elsewhere classified: Secondary | ICD-10-CM | POA: Diagnosis present

## 2022-03-19 LAB — URINALYSIS, ROUTINE W REFLEX MICROSCOPIC
Bacteria, UA: NONE SEEN
Bilirubin Urine: NEGATIVE
Glucose, UA: >=500 mg/dL — AB
Ketones, ur: NEGATIVE mg/dL
Leukocytes,Ua: NEGATIVE
Nitrite: NEGATIVE
Protein, ur: 100 mg/dL — AB
Specific Gravity, Urine: 1.03 (ref 1.005–1.030)
pH: 7 (ref 5.0–8.0)

## 2022-03-19 LAB — COMPREHENSIVE METABOLIC PANEL
ALT: 16 U/L (ref 0–44)
AST: 25 U/L (ref 15–41)
Albumin: 3.5 g/dL (ref 3.5–5.0)
Alkaline Phosphatase: 101 U/L (ref 38–126)
Anion gap: 8 (ref 5–15)
BUN: 18 mg/dL (ref 8–23)
CO2: 28 mmol/L (ref 22–32)
Calcium: 8.3 mg/dL — ABNORMAL LOW (ref 8.9–10.3)
Chloride: 100 mmol/L (ref 98–111)
Creatinine, Ser: 1.52 mg/dL — ABNORMAL HIGH (ref 0.61–1.24)
GFR, Estimated: 45 mL/min — ABNORMAL LOW (ref >60)
Glucose, Bld: 375 mg/dL — ABNORMAL HIGH (ref 70–99)
Potassium: 3.6 mmol/L (ref 3.5–5.1)
Sodium: 136 mmol/L (ref 135–145)
Total Bilirubin: 0.4 mg/dL (ref 0.3–1.2)
Total Protein: 8.2 g/dL — ABNORMAL HIGH (ref 6.5–8.1)

## 2022-03-19 LAB — PROTIME-INR
INR: 1.1 (ref 0.8–1.2)
Prothrombin Time: 14.5 seconds (ref 11.4–15.2)

## 2022-03-19 LAB — DIFFERENTIAL
Abs Immature Granulocytes: 0.12 10*3/uL — ABNORMAL HIGH (ref 0.00–0.07)
Basophils Absolute: 0 10*3/uL (ref 0.0–0.1)
Basophils Relative: 0 %
Eosinophils Absolute: 0 10*3/uL (ref 0.0–0.5)
Eosinophils Relative: 0 %
Immature Granulocytes: 2 %
Lymphocytes Relative: 26 %
Lymphs Abs: 1.8 10*3/uL (ref 0.7–4.0)
Monocytes Absolute: 1.6 10*3/uL — ABNORMAL HIGH (ref 0.1–1.0)
Monocytes Relative: 22 %
Neutro Abs: 3.5 10*3/uL (ref 1.7–7.7)
Neutrophils Relative %: 50 %

## 2022-03-19 LAB — I-STAT CHEM 8, ED
BUN: 16 mg/dL (ref 8–23)
Calcium, Ion: 1.14 mmol/L — ABNORMAL LOW (ref 1.15–1.40)
Chloride: 98 mmol/L (ref 98–111)
Creatinine, Ser: 1.6 mg/dL — ABNORMAL HIGH (ref 0.61–1.24)
Glucose, Bld: 378 mg/dL — ABNORMAL HIGH (ref 70–99)
HCT: 31 % — ABNORMAL LOW (ref 39.0–52.0)
Hemoglobin: 10.5 g/dL — ABNORMAL LOW (ref 13.0–17.0)
Potassium: 3.7 mmol/L (ref 3.5–5.1)
Sodium: 140 mmol/L (ref 135–145)
TCO2: 29 mmol/L (ref 22–32)

## 2022-03-19 LAB — CBC
HCT: 30.9 % — ABNORMAL LOW (ref 39.0–52.0)
Hemoglobin: 9.9 g/dL — ABNORMAL LOW (ref 13.0–17.0)
MCH: 29.9 pg (ref 26.0–34.0)
MCHC: 32 g/dL (ref 30.0–36.0)
MCV: 93.4 fL (ref 80.0–100.0)
Platelets: 112 10*3/uL — ABNORMAL LOW (ref 150–400)
RBC: 3.31 MIL/uL — ABNORMAL LOW (ref 4.22–5.81)
RDW: 16.1 % — ABNORMAL HIGH (ref 11.5–15.5)
WBC: 7.1 10*3/uL (ref 4.0–10.5)
nRBC: 0 % (ref 0.0–0.2)

## 2022-03-19 LAB — ETHANOL: Alcohol, Ethyl (B): <10 mg/dL (ref <10)

## 2022-03-19 LAB — CBG MONITORING, ED
Glucose-Capillary: 362 mg/dL — ABNORMAL HIGH (ref 70–99)
Glucose-Capillary: 233 mg/dL — ABNORMAL HIGH (ref 70–99)

## 2022-03-19 LAB — RAPID URINE DRUG SCREEN, HOSP PERFORMED
Amphetamines: NOT DETECTED
Barbiturates: NOT DETECTED
Benzodiazepines: NOT DETECTED
Cocaine: NOT DETECTED
Opiates: NOT DETECTED
Tetrahydrocannabinol: NOT DETECTED

## 2022-03-19 LAB — APTT: aPTT: 33 seconds (ref 24–36)

## 2022-03-19 MED ORDER — INSULIN ASPART 100 UNIT/ML IJ SOLN
0.0000 [IU] | Freq: Four times a day (QID) | INTRAMUSCULAR | Status: DC
  Administered 2022-03-19: 3 [IU] via SUBCUTANEOUS
  Administered 2022-03-21: 9 [IU] via SUBCUTANEOUS
  Administered 2022-03-21: 3 [IU] via SUBCUTANEOUS
  Administered 2022-03-21 – 2022-03-22 (×2): 2 [IU] via SUBCUTANEOUS
  Administered 2022-03-22: 1 [IU] via SUBCUTANEOUS
  Administered 2022-03-23: 2 [IU] via SUBCUTANEOUS
  Administered 2022-03-23: 1 [IU] via SUBCUTANEOUS
  Administered 2022-03-23: 5 [IU] via SUBCUTANEOUS
  Filled 2022-03-19: qty 1

## 2022-03-19 MED ORDER — ONDANSETRON HCL 4 MG PO TABS: 4.0000 mg | ORAL_TABLET | Freq: Four times a day (QID) | ORAL | Status: DC | PRN

## 2022-03-19 MED ORDER — LEVETIRACETAM IN NACL 500 MG/100ML IV SOLN
500.0000 mg | Freq: Two times a day (BID) | INTRAVENOUS | Status: DC
  Administered 2022-03-20 – 2022-03-28 (×16): 500 mg via INTRAVENOUS
  Filled 2022-03-19 (×16): qty 100

## 2022-03-19 MED ORDER — LORAZEPAM 2 MG/ML IJ SOLN
0.5000 mg | INTRAMUSCULAR | Status: DC | PRN
  Administered 2022-03-19: 0.5 mg via INTRAVENOUS
  Filled 2022-03-19: qty 1

## 2022-03-19 MED ORDER — NICARDIPINE HCL IN NACL 20-0.86 MG/200ML-% IV SOLN
3.0000 mg/h | INTRAVENOUS | Status: DC
  Administered 2022-03-19: 3 mg/h via INTRAVENOUS
  Filled 2022-03-19: qty 200

## 2022-03-19 MED ORDER — POTASSIUM CHLORIDE IN NACL 20-0.9 MEQ/L-% IV SOLN
INTRAVENOUS | Status: DC
  Filled 2022-03-19 (×2): qty 1000

## 2022-03-19 MED ORDER — IOHEXOL 350 MG/ML SOLN
100.0000 mL | Freq: Once | INTRAVENOUS | Status: AC | PRN
  Administered 2022-03-19: 100 mL via INTRAVENOUS

## 2022-03-19 MED ORDER — LEVETIRACETAM IN NACL 500 MG/100ML IV SOLN
500.0000 mg | Freq: Once | INTRAVENOUS | Status: AC
  Administered 2022-03-19: 500 mg via INTRAVENOUS
  Filled 2022-03-19: qty 100

## 2022-03-19 MED ORDER — ONDANSETRON HCL 4 MG/2ML IJ SOLN: 4.0000 mg | Freq: Four times a day (QID) | INTRAMUSCULAR | Status: DC | PRN

## 2022-03-19 MED ORDER — ACETAMINOPHEN 325 MG PO TABS
650.0000 mg | ORAL_TABLET | Freq: Four times a day (QID) | ORAL | Status: DC | PRN
  Administered 2022-03-22: 650 mg via ORAL
  Filled 2022-03-19: qty 2

## 2022-03-19 MED ORDER — ACETAMINOPHEN 650 MG RE SUPP: 650.0000 mg | Freq: Four times a day (QID) | RECTAL | Status: DC | PRN

## 2022-03-19 MED ORDER — LACTATED RINGERS IV BOLUS
1000.0000 mL | Freq: Once | INTRAVENOUS | Status: AC
  Administered 2022-03-19: 1000 mL via INTRAVENOUS

## 2022-03-19 NOTE — Assessment & Plan Note (Signed)
O2 sats dropped to 88% on room air, continue 2 L.  No lung disease.  Per daughter at bedside, never smoked cigarettes.  ? secondary to intracranial process considering midline shift - Obtain chest x-ray

## 2022-03-19 NOTE — Progress Notes (Signed)
[place]

## 2022-03-19 NOTE — H&P (Signed)
History and Physical    Jacob Barnes FVC:[id number] DOB: 1940/08/16 DOA: 03/19/2022  PCP: Fayrene Helper, MD   Patient coming from: Home  I have personally briefly reviewed patient's old medical records in [place]  Chief Complaint: AMS  HPI: Jacob Barnes is a 81 y.o. male with medical history significant for diabetes mellitus, hypertension, mild dementia, CKD 3, retroperitoneal hematoma. Patient was brought to the ED with reports of altered mental status.  At the time of my evaluation, patient is sleeping, he would awaken intermittently and try to sit up or get off the bed but family is able to direct him.  He is unable to answer questions. Patient was last seen normal at about 10:30 AM when his daughter left him at home to go to discharge.  Patient drove to his sister's house, sister talked to him at about 28 on the phone, she says his speech was not quite normal, and sisters husband reported that he did not look or behave his normal self when he came in the door.  She reports abnormal behavior of  looking in the wrong direction when responding to her questions and could not seem to look in her direction, using his fingers to eat instead of silverware and several times he putting an empty spoon In his mouth.  He was subsequently brought to the ED. Since then, he has been repeating the same incoherent phrases several times. Normally denies known falls.  He is not on antiplatelet or anticoagulation.  At baseline he is supposed to ambulate with a cane but he barely uses it.  ED Course: Blood pressure 140s to 180s.  Code Stroke was called.   UA not suggestive of UTI.  Head CT shows-1.1 cm mixed density subdural hematoma along the left cerebral convexity with 6 mm rightward midline shift. Neurology recommended consulting neurosurgery Neurosurgeon Dr. Kathyrn Sheriff, recommended admission to [facility] and they will see in consult. Keppra started, He states SDH does not appear acute and  recommends evaluate for additional causes of AMS.  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   Anemia    Arthritis    hands   Carpal tunnel syndrome 08/05/2009   Qualifier: Diagnosis of  By: Aline Brochure MD, [name]     Chronic kidney disease    Diabetes mellitus    says since 1979 type 2   Diabetes mellitus without complication ([place])    Phreesia 04/03/2020   ERECTILE DYSFUNCTION, ORGANIC 01/20/2009   Qualifier: Diagnosis of  By: Moshe Cipro MD, Margaret     GERD (gastroesophageal reflux disease)    Glaucoma    both eyes   History of kidney stones    Hyperlipemia    Hypertension    Iron deficiency anemia 01/10/2014   Other pancytopenia ([place]) 01/10/2014   New in 01/2014, undergoing pathology review    Rash    front abdomen no drainage    Past Surgical History:  Procedure Laterality Date   AMPUTATION Right 09/01/2016   Procedure: AMPUTATION RIGHT MIDDLE FINGER TIP;  Surgeon: Mcarthur Rossetti, MD;  Location: [facility] ORS;  Service: Orthopedics;  Laterality: Right;   AMPUTATION Right 10/10/2016   Procedure: REPEAT IRRIGATION AND DEBRIDEMENT RIGHT MIDDLE FINGER WITH AMPUTATION THROUGH PROXIMAL PHALANX;  Surgeon: Mcarthur Rossetti, MD;  Location: [facility]  OR;  Service: Orthopedics;  Laterality: Right;   APPENDECTOMY     BIOPSY  01/13/2022   Procedure: BIOPSY;  Surgeon: Harvel Quale, MD;  Location: [facility] SUITE;  Service: Gastroenterology;;   COLONOSCOPY  09/06/2011   Procedure: COLONOSCOPY;  Surgeon: Rogene Houston, MD;  Location: [facility];  Service: Endoscopy;  Laterality: N/A;  830   COLONOSCOPY WITH PROPOFOL N/A 01/13/2022   Procedure: COLONOSCOPY WITH PROPOFOL;  Surgeon: Harvel Quale, MD;  Location: [facility];  Service: Gastroenterology;  Laterality: N/A;  200 ASA 3    CYSTOSCOPY/RETROGRADE/URETEROSCOPY/STONE EXTRACTION WITH BASKET     ESOPHAGOGASTRODUODENOSCOPY N/A 01/15/2014   Procedure: ESOPHAGOGASTRODUODENOSCOPY (EGD);  Surgeon: Rogene Houston, MD;  Location: [facility];  Service: Endoscopy;  Laterality: N/A;  200   ESOPHAGOGASTRODUODENOSCOPY N/A 04/22/2014   Procedure: ESOPHAGOGASTRODUODENOSCOPY (EGD);  Surgeon: Rogene Houston, MD;  Location: [facility];  Service: Endoscopy;  Laterality: N/A;  240   EYE SURGERY Bilateral yrs ago   ioc for cataract   MALONEY DILATION N/A 01/15/2014   Procedure: MALONEY DILATION;  Surgeon: Rogene Houston, MD;  Location: [facility];  Service: Endoscopy;  Laterality: N/A;   MALONEY DILATION  04/22/2014   Procedure: Venia Minks DILATION;  Surgeon: Rogene Houston, MD;  Location: [facility];  Service: Endoscopy;;   POLYPECTOMY  01/13/2022   Procedure: POLYPECTOMY;  Surgeon: Montez Morita, Quillian Quince, MD;  Location: [facility];  Service: Gastroenterology;;   surgery for left eye bleedf  2017     reports that he has never smoked. He has never been exposed to tobacco smoke. He has never used smokeless tobacco. He reports that he does not drink alcohol and does not use drugs.  Allergies  Allergen Reactions   Alpha-Gal     Family History  Problem Relation Age of Onset   Diabetes Mother    Hypertension Mother    Hyperlipidemia Mother    Stroke Mother    Heart failure Mother    Diabetes Father    Heart disease Father    Hypertension Father    Hyperlipidemia Father    Heart attack Father    Diabetes Brother    Diabetes Brother    Diabetes Son     Prior to Admission medications   Medication Sig Start Date End Date Taking? Authorizing Provider  benazepril (LOTENSIN) 20 MG tablet Take 1 tablet (20 mg total) by mouth daily. 02/16/22   Fayrene Helper, MD  blood glucose meter kit and supplies Dispense based on patient and insurance preference. Use up to three times daily as directed. (dx E11.65). 09/21/16    Fayrene Helper, MD  CALCIUM PO Take 1 tablet by mouth 3 (three) times a week.    [provider]  Cholecalciferol (VITAMIN D3 PO) Take 1 tablet by mouth 3 (three) times a week.    [provider]  diphenoxylate-atropine (LOMOTIL) 2.5-0.025 MG tablet TAKE ONE TABLET BY MOUTH THREE TIMES WEEKLY AS NEEDED, FOR LOOSE STOOL 03/16/22   Fayrene Helper, MD  donepezil (ARICEPT) 10 MG tablet Take 1 tablet (10 mg total) by mouth at bedtime. 03/29/21   Fayrene Helper, MD  dorzolamide-timolol (COSOPT) 22.3-6.8 MG/ML ophthalmic solution Place 1 drop into both eyes 3 (three) times a week. 07/07/20   [provider]  insulin degludec (TRESIBA FLEXTOUCH) 100 UNIT/ML FlexTouch Pen Inject 5 Units into the skin at bedtime. 01/26/22   Brita Romp, NP  Insulin Pen Needle (PEN  NEEDLES) 31G X 6 MM MISC 1 each by Does not apply route at bedtime. 11/18/21   Brita Romp, NP  Iron, Ferrous Sulfate, 325 (65 Fe) MG TABS Take one tablet by mouth once daily Patient taking differently: Take 325 mg by mouth 3 (three) times a week. 12/17/20   Fayrene Helper, MD  latanoprost (XALATAN) 0.005 % ophthalmic solution Place 1 drop into both eyes 3 (three) times a week. At bedtime 01/10/19   [provider]  linagliptin (TRADJENTA) 5 MG TABS tablet Take 1 tablet (5 mg total) by mouth daily with breakfast. 01/26/22   Brita Romp, NP  [facility] VERIO test strip USE AS DIRECTED UP TO 4 TIMES DAILY 01/12/21   Fayrene Helper, MD  pantoprazole (PROTONIX) 40 MG tablet TAKE 1 TABLET BY MOUTH EVERY DAY Patient not taking: Reported on 03/13/2022 06/27/21   Fayrene Helper, MD  rosuvastatin (CRESTOR) 5 MG tablet Take one tablet by mouth every Monday , Wednesday and Friday Patient not taking: Reported on 03/13/2022 12/07/20   Fayrene Helper, MD    Physical Exam: Limited exam due to altered mental status Vitals:   03/19/22 1652 03/19/22 1653 03/19/22 1700 03/19/22 1730   BP: (!) 156/70  (!) 153/82 (!) 164/98  Pulse:  85 84 87  Resp: _0 Temp:      TempSrc:      SpO2:  98% 98% 95%  Weight:      Height:        Constitutional: Calm, sleeping Vitals:   03/19/22 1652 03/19/22 1653 03/19/22 1700 03/19/22 1730  BP: (!) 156/70  (!) 153/82 (!) 164/98  Pulse:  85 84 87  Resp: _1 Temp:      TempSrc:      SpO2:  98% 98% 95%  Weight:      Height:       Eyes: PERRL, lids and conjunctivae normal ENMT: Mucous membranes are moist.  Neck: normal, supple, no masses, no thyromegaly Respiratory: clear to auscultation bilaterally, no wheezing, no crackles. Normal respiratory effort. No accessory muscle use.  Cardiovascular: Regular rate and rhythm, no murmurs / rubs / gallops.  Chronic trace pitting bilateral lower extremity edema to mid leg Abdomen: no tenderness, no masses palpated. No hepatosplenomegaly. Bowel sounds positive.  Musculoskeletal: no clubbing / cyanosis. No joint deformity upper and lower extremities.  Skin: Open abrasion to right shin no rashes, lesions,  Neurologic: Unable to assess due to altered mental status Psychiatric: Sleeping, unable to assess due to altered mental status.  Labs on Admission: I have personally reviewed following labs and imaging studies  CBC: Recent Labs  Lab 03/19/22 1615 03/19/22 1652  WBC 7.1  --   NEUTROABS 3.5  --   HGB 9.9* 10.5*  HCT 30.9* 31.0*  MCV 93.4  --   PLT 112*  --    Basic Metabolic Panel: Recent Labs  Lab 03/19/22 1615 03/19/22 1652  NA 136 140  K 3.6 3.7  CL 100 98  CO2 28  --   GLUCOSE 375* 378*  BUN 18 16  CREATININE 1.52* 1.60*  CALCIUM 8.3*  --    GFR: Estimated Creatinine Clearance: 26.3 mL/min (A) (by C-G formula based on SCr of 1.6 mg/dL (H)). Liver Function Tests: Recent Labs  Lab 03/19/22 1615  AST 25  ALT 16  ALKPHOS 101  BILITOT 0.4  PROT 8.2*  ALBUMIN 3.5   Coagulation Profile: Recent Labs  Lab  03/19/22 1615  INR 1.1   CBG: Recent  Labs  Lab 03/19/22 1612  GLUCAP 362*   Radiological Exams on Admission: DG Tibia/Fibula Right  Result Date: 03/19/2022 CLINICAL DATA:  Abrasion, evaluate for trauma, altered mental status EXAM: RIGHT TIBIA AND FIBULA - 2 VIEW COMPARISON:  None Available. FINDINGS: There is no evidence of fracture or other focal bone lesions. Small metallic foreign body adjacent to the mid fibular diaphysis. Soft tissues otherwise unremarkable. IMPRESSION: 1.  No fracture or dislocation of the right tibia or fibula. 2. Small metallic foreign body adjacent to the mid fibular diaphysis, of uncertain nature or chronicity. Electronically Signed   By: Delanna Ahmadi M.D.   On: 03/19/2022 17:30   CT ANGIO HEAD NECK W WO CM W PERF (CODE STROKE)  Result Date: 03/19/2022 CLINICAL DATA:  Neuro deficit, acute, stroke suspected. EXAM: CT ANGIOGRAPHY HEAD AND NECK CT PERFUSION BRAIN TECHNIQUE: Multidetector CT imaging of the head and neck was performed using the standard protocol during bolus administration of intravenous contrast. Multiplanar CT image reconstructions and MIPs were obtained to evaluate the vascular anatomy. Carotid stenosis measurements (when applicable) are obtained utilizing NASCET criteria, using the distal internal carotid diameter as the denominator. Multiphase CT imaging of the brain was performed following IV bolus contrast injection. Subsequent parametric perfusion maps were calculated using RAPID software. RADIATION DOSE REDUCTION: This exam was performed according to the departmental dose-optimization program which includes automated exposure control, adjustment of the mA and/or kV according to patient size and/or use of iterative reconstruction technique. CONTRAST:  112m OMNIPAQUE IOHEXOL 350 MG/ML SOLN COMPARISON:  Head CT immediately prior FINDINGS: CTA NECK FINDINGS Aortic arch: Minimal atherosclerosis. Branching pattern is normal without flow limiting stenosis. Right carotid system: Common carotid  artery widely patent to the bifurcation. Carotid bifurcation is normal without soft or calcified plaque. Cervical ICA is normal. Left carotid system: Common carotid artery widely patent to the bifurcation. Minimal scattered plaque but no stenosis. Soft and calcified plaque at the carotid bifurcation but no stenosis. Cervical ICA widely patent. Vertebral arteries: Both vertebral artery origins are widely patent. The right is dominant. Both vessels are widely patent through the cervical region to the foramen magnum. Skeleton: Ordinary spondylosis. Other neck: No mass or lymphadenopathy. Upper chest: Mild upper lobe scarring.  No active process. Review of the MIP images confirms the above findings CTA HEAD FINDINGS Anterior circulation: Both internal carotid arteries are widely patent through the skull base and siphon regions. Mild siphon atherosclerotic calcification but no stenosis. The anterior and middle cerebral vessels are patent. No large vessel occlusion or proximal stenosis. No aneurysm or vascular malformation. Displacement of the brain and vascular structures on the left because of the left convexity subdural hematoma. No evidence of gross active extravasation. Maximal thickness of the hematoma remains 18 mm. Posterior circulation: Both vertebral arteries patent through the foramen magnum. The left terminates in PICA. The right supplies the basilar artery. Both superior cerebellar and posterior cerebral arteries are patent. Posterior cerebral arteries receive most of there supply from the anterior circulation. Venous sinuses: Patent and normal. Anatomic variants: None significant. Review of the MIP images confirms the above findings CT Brain Perfusion Findings: ASPECTS: 10 CBF (<30%) Volume: 0 mL, allowing for artifact trip beaded to the subdural hematoma. Perfusion (Tmax>6.0s) volume: 033m allowing for artifact trip viewed to the subdural hematoma. Mismatch Volume: 45m26mnfarction Location:None IMPRESSION:  1. No large vessel occlusion or proximal stenosis. 2. No aneurysm or vascular malformation. 3. No evidence  of core infarction or penumbra. 4. Large left convexity subdural hematoma with maximal thickness of 18 mm. Displacement of the brain and vascular structures on the left because of the subdural hematoma. No evidence of gross active extravasation. No change in size from the prior CT. 5. These results were communicated to Dr. Leonel Ramsay at 5:01 pm on 03/19/2022 by text page via the [facility] messaging system. 6. Aortic atherosclerosis. Aortic Atherosclerosis (ICD10-I70.0). Electronically Signed   By: Nelson Chimes M.D.   On: 03/19/2022 17:01   CT HEAD CODE STROKE WO CONTRAST  Result Date: 03/19/2022 CLINICAL DATA:  Code stroke.  Altered mental status.  Subdural EXAM: CT HEAD WITHOUT CONTRAST TECHNIQUE: Contiguous axial images were obtained from the base of the skull through the vertex without intravenous contrast. RADIATION DOSE REDUCTION: This exam was performed according to the departmental dose-optimization program which includes automated exposure control, adjustment of the mA and/or kV according to patient size and/or use of iterative reconstruction technique. COMPARISON:  01/30/14 CT head FINDINGS: Brain: There is a 1.1 cm mixed density subdural hematoma along the left cerebral convexity. There is approximately 6 mm rightward midline shift. There is effacement of the sulci in the left cerebral hemisphere. There is chronic mineralization of the bilateral basal ganglia. Compared to 2015 there is asymmetric enlargement of the right lateral ventricle, which is likely related to interval volume loss and mass effect on the left lateral ventricle. Vascular: No hyperdense vessel or unexpected calcification. Skull: Normal. Negative for fracture or focal lesion. Sinuses/Orbits: No acute finding.  Bilateral lens replacement. Other: None. ASPECTS ([place] Stroke Program Early CT Score): 10 IMPRESSION: 1. No CT evidence  of an acute infarct. 2. 1.1 cm mixed density subdural hematoma along the left cerebral convexity with 6 mm rightward midline shift and effacement of the sulci in the left cerebral hemisphere. 3. ASPECTS is 10. Findings were discussed with Dr. Maylon Peppers on 03/19/22 at 4:43 PM Electronically Signed   By: Marin Roberts M.D.   On: 03/19/2022 16:43    EKG: Independently reviewed.  Sinus rhythm rate 79, QTc 474.  T wave inversions in lead I and aVL V2.   Assessment/Plan Principal Problem:   Subdural hematoma (HCC) Active Problems:   Acute hypoxic respiratory failure (HCC)   Essential hypertension, benign   CKD (chronic kidney disease) stage 3, GFR 30-59 ml/min (HCC)   Uncontrolled type 2 diabetes mellitus with hyperglycemia (HCC)   Dementia (HCC)   Assessment and Plan: * Subdural hematoma ([place]) Presenting with altered mental status, aphasia, hemianopia, and reported right-sided neglect.  Physical exam limited by altered mental status.  Family not aware of recent falls or head trauma.  He is not on anticoagulation or antiplatelet. - CT Head- 1.1 cm mixed density subdural hematoma along the left cerebral convexity with 6 mm rightward midline shift and effacement of the sulci in the left cerebral hemisphere. - EDP consulted neurosurgery-Dr. Kathyrn Sheriff- admit to [facility], will see in consult.  Start Keppra 500 mg twice daily, systolic goal blood pressure less than 160, SDH does not appear acute, recommends evaluation for additional causes of AMS. -UA not suggestive of UTI -Obtain chest x-ray-mild parenchymal edema -N.p.o. - 1 L bolus given, N/s + 20 Kcl 50cc/hr -I talked to neurosurgeon Dr. Kathyrn Sheriff, no indication for mannitol, no need for ED to ED transfer at this time, as no plans for emergent surgery.  Acute hypoxic respiratory failure (HCC) O2 sats dropped to 88% on room air, continue 2 L.  No lung disease.  Per daughter at bedside, never smoked cigarettes.  ? secondary to intracranial process  considering midline shift - Obtain chest x-ray  Dementia (HCC) Mild.  Occasional short term memory deficits, otherwise he is able to take care of himself and still drives.  Uncontrolled type 2 diabetes mellitus with hyperglycemia (HCC) A1c 9.1. - SSI- S q6h -Hold home Tresiba, linagliptin  CKD (chronic kidney disease) stage 3, GFR 30-59 ml/min (HCC) Creatinine 1.5, baseline about 1.1 - 1.3.  Possibly mild acute on CKD. - Hydrate gently for now  Essential hypertension, benign Blood pressure elevated initially 150s now up to 372B systolic.  Goal blood pressure with subdural hematoma per neurosurgery- < 160. -Will start Cardene drip goal blood pressure 140-160   DVT prophylaxis: SCDs Code Status: Full code.- I confirmed with patient's daughter who also confirmed with patient's spouse at home. Family Communication: Daughter Joaquin [place], and Sister- Stanton Kidney at bedside Disposition Plan: > 2 days Consults called:  Neurosurgery Admission status: Inpatient, progressive I certify that at the point of admission it is my clinical judgment that the patient will require inpatient hospital care spanning beyond 2 midnights from the point of admission due to high intensity of service, high risk for further deterioration and high frequency of surveillance required.    Author: Bethena Roys, MD 03/19/2022 9:32 PM  For on call review [email or web address].

## 2022-03-19 NOTE — Assessment & Plan Note (Addendum)
A1c 9.1. - SSI- S q6h -Hold home Tresiba, linagliptin

## 2022-03-19 NOTE — Assessment & Plan Note (Addendum)
Presenting with altered mental status, aphasia, hemianopia, and reported right-sided neglect.  Physical exam limited by altered mental status.  Family not aware of recent falls or head trauma.  He is not on anticoagulation or antiplatelet. - CT Head- 1.1 cm mixed density subdural hematoma along the left cerebral convexity with 6 mm rightward midline shift and effacement of the sulci in the left cerebral hemisphere. - EDP consulted neurosurgery-Dr. Kathyrn Sheriff- admit to [facility], will see in consult.  Start Keppra 500 mg twice daily, systolic goal blood pressure less than 160, SDH does not appear acute, recommends evaluation for additional causes of AMS. -UA not suggestive of UTI -Obtain chest x-ray-mild parenchymal edema -N.p.o. - 1 L bolus given, N/s + 20 Kcl 50cc/hr -I talked to neurosurgeon Dr. Kathyrn Sheriff, no indication for mannitol, no need for ED to ED transfer at this time, as no plans for emergent surgery.

## 2022-03-19 NOTE — Assessment & Plan Note (Signed)
Creatinine 1.5, baseline about 1.1 - 1.3.  Possibly mild acute on CKD. - Hydrate gently for now

## 2022-03-19 NOTE — Assessment & Plan Note (Signed)
Mild.  Occasional short term memory deficits, otherwise he is able to take care of himself and still drives.

## 2022-03-19 NOTE — Assessment & Plan Note (Signed)
Blood pressure elevated initially 150s now up to 016D systolic.  Goal blood pressure with subdural hematoma per neurosurgery- < 160. -Will start Cardene drip goal blood pressure 140-160

## 2022-03-19 NOTE — Consult Note (Signed)
[facility] Telemedicine Consult   Requesting Provider: Sharman Crate Consult Participants: Bedside nurses, telestroke nurse Location of the provider: [facility] Location of the patient: [facility]   This consult was provided via telemedicine with 2-way video and audio communication. The patient/family was informed that care would be provided in this way and agreed to receive care in this manner.    Chief Complaint: Altered mental status  HPI: 81 year old male with a history of mild dementia (still drives and takes care of all his own finances, just forgets to take his medicine occasionally) who presents with significant altered mental status.  He was last in his normal state of health this morning while his daughter was getting ready for church.  She left around 1030, and then his sister talked to him on the phone at 70.      LKW: 10:30  tpa given?: No, out of window IR Thrombectomy?  Modified Rankin Scale: 2-Slight disability-UNABLE to perform all activities but does not need assistance Time of teleneurologist evaluation: 16:30  Exam: Vitals:   03/19/22 1615 03/19/22 1619  BP:    Pulse: 78 82  Resp: (!) 22 19  Temp:    SpO2: 100% 100%    General: In bed, NAD, significant perseveration  1A: Level of Consciousness - 0 1B: Ask Month and Age - 2 1C: 'Blink Eyes' & 'Squeeze Hands' - 2 2: Test Horizontal Extraocular Movements - 1 3: Test Visual Fields - 2 4: Test Facial Palsy - 0 5A: Test Left Arm Motor Drift - 0 5B: Test Right Arm Motor Drift - 0 6A: Test Left Leg Motor Drift - 0 6B: Test Right Leg Motor Drift - 0 7: Test Limb Ataxia - 0 8: Test Sensation - 0 9: Test Language/Aphasia- 2 10: Test Dysarthria - 0 11: Test Extinction/Inattention - 0 NIHSS score: 7   Imaging Reviewed: CT head-subdural hematoma  Labs reviewed in epic and pertinent values follow: INR-normal   Assessment: 81 year old male with worsening aphasia, hemianopia with large  subdural hematoma.  At this time, will defer to neurosurgery for further evaluation and management.  Recommendations: Recommend neurosurgical consultation   Roland Rack, MD [facility] [phone]  If 7pm- 7am, please page neurology on call as listed in [place].

## 2022-03-19 NOTE — ED Triage Notes (Addendum)
Daughter was called from church due to pt speaking to his sister and sister was concerned for a "mini stroke", daughter was texted that pt was not acting like himself around 1430.  Pt denies any pain, no change in speech per daughter.   Wound noted to right shin

## 2022-03-19 NOTE — ED Provider Notes (Signed)
[facility] EMERGENCY DEPARTMENT Provider Note   CSN: [id number] Arrival date & time: 03/19/22  1556  An emergency department physician performed an initial assessment on this suspected stroke patient at 1657.  History  No chief complaint on file.   Jacob Barnes is a 81 y.o. male.  Patient is an 81 year old male with a past medical history of hypertension, diabetes and CKD presenting to the emergency department with altered mental status.  Patient is here with his family members who states that they last saw him well around 8-8 30 this morning when he got up.  They state that he was acting like his normal self and was cleaning around the house.  They state that he went to church today and they received a call around 130 that he was confused and was not answering questions or talking appropriately.  They deny any known trauma or falls or recent illnesses.  They deny any blood thinners.  The history is provided by a relative. The history is limited by the condition of the patient (Altered mental status).       Home Medications Prior to Admission medications   Medication Sig Start Date End Date Taking? Authorizing Provider  benazepril (LOTENSIN) 20 MG tablet Take 1 tablet (20 mg total) by mouth daily. 02/16/22   Fayrene Helper, MD  blood glucose meter kit and supplies Dispense based on patient and insurance preference. Use up to three times daily as directed. (dx E11.65). 09/21/16   Fayrene Helper, MD  CALCIUM PO Take 1 tablet by mouth 3 (three) times a week.    [provider]  Cholecalciferol (VITAMIN D3 PO) Take 1 tablet by mouth 3 (three) times a week.    [provider]  diphenoxylate-atropine (LOMOTIL) 2.5-0.025 MG tablet TAKE ONE TABLET BY MOUTH THREE TIMES WEEKLY AS NEEDED, FOR LOOSE STOOL 03/16/22   Fayrene Helper, MD  donepezil (ARICEPT) 10 MG tablet Take 1 tablet (10 mg total) by mouth at bedtime. 03/29/21   Fayrene Helper, MD   dorzolamide-timolol (COSOPT) 22.3-6.8 MG/ML ophthalmic solution Place 1 drop into both eyes 3 (three) times a week. 07/07/20   [provider]  insulin degludec (TRESIBA FLEXTOUCH) 100 UNIT/ML FlexTouch Pen Inject 5 Units into the skin at bedtime. 01/26/22   Brita Romp, NP  Insulin Pen Needle (PEN NEEDLES) 31G X 6 MM MISC 1 each by Does not apply route at bedtime. 11/18/21   Brita Romp, NP  Iron, Ferrous Sulfate, 325 (65 Fe) MG TABS Take one tablet by mouth once daily Patient taking differently: Take 325 mg by mouth 3 (three) times a week. 12/17/20   Fayrene Helper, MD  latanoprost (XALATAN) 0.005 % ophthalmic solution Place 1 drop into both eyes 3 (three) times a week. At bedtime 01/10/19   [provider]  linagliptin (TRADJENTA) 5 MG TABS tablet Take 1 tablet (5 mg total) by mouth daily with breakfast. 01/26/22   Brita Romp, NP  [facility] VERIO test strip USE AS DIRECTED UP TO 4 TIMES DAILY 01/12/21   Fayrene Helper, MD  pantoprazole ([place]) 40 MG tablet TAKE 1 TABLET BY MOUTH EVERY DAY Patient not taking: Reported on 03/13/2022 06/27/21   Fayrene Helper, MD  rosuvastatin (CRESTOR) 5 MG tablet Take one tablet by mouth every Monday , Wednesday and Friday Patient not taking: Reported on 03/13/2022 12/07/20   Fayrene Helper, MD      Allergies    Alpha-gal  Review of Systems   Review of Systems  Physical Exam Updated Vital Signs BP (!) 164/98   Pulse 87   Temp 98.5 F (36.9 C) (Oral)   Resp 15   Ht _0  (1.626 m)   Wt 52.2 kg   SpO2 95%   BMI 19.74 kg/m  Physical Exam Vitals and nursing note reviewed.  Constitutional:      General: He is not in acute distress.    Appearance: Normal appearance.  HENT:     Head: Normocephalic and atraumatic.     Nose: Nose normal.     Mouth/Throat:     Mouth: Mucous membranes are moist.     Pharynx: Oropharynx is clear.  Eyes:     Pupils: Pupils are equal, round, and reactive to  light.     Comments: R-sided gaze neglect   Cardiovascular:     Rate and Rhythm: Normal rate and regular rhythm.  Pulmonary:     Effort: Pulmonary effort is normal.     Breath sounds: Normal breath sounds.  Abdominal:     General: Abdomen is flat.     Palpations: Abdomen is soft.     Tenderness: There is no abdominal tenderness.  Musculoskeletal:        General: No swelling, tenderness or deformity. Normal range of motion.     Cervical back: Normal range of motion and neck supple.     Right lower leg: No edema.     Left lower leg: No edema.  Skin:    General: Skin is warm and dry.     Comments: Abrasion to R shin, no active bleeding  Neurological:     Mental Status: He is alert.     Comments: Oriented to person Difficulty following commands  No drift in bilateral UE, drift in bilateral LE though limited due to patient's confusion in following commands No obvious facial asymmetry   Psychiatric:        Mood and Affect: Mood normal.        Behavior: Behavior normal.     ED Results / Procedures / Treatments   Labs (all labs ordered are listed, but only abnormal results are displayed) Labs Reviewed  CBC - Abnormal; Notable for the following components:      Result Value   RBC 3.31 (*)    Hemoglobin 9.9 (*)    HCT 30.9 (*)    RDW 16.1 (*)    Platelets 112 (*)    All other components within normal limits  DIFFERENTIAL - Abnormal; Notable for the following components:   Monocytes Absolute 1.6 (*)    Abs Immature Granulocytes 0.12 (*)    All other components within normal limits  COMPREHENSIVE METABOLIC PANEL - Abnormal; Notable for the following components:   Glucose, Bld 375 (*)    Creatinine, Ser 1.52 (*)    Calcium 8.3 (*)    Total Protein 8.2 (*)    GFR, Estimated 45 (*)    All other components within normal limits  CBG MONITORING, ED - Abnormal; Notable for the following components:   Glucose-Capillary 362 (*)    All other components within normal limits  I-STAT  CHEM 8, ED - Abnormal; Notable for the following components:   Creatinine, Ser 1.60 (*)    Glucose, Bld 378 (*)    Calcium, Ion 1.14 (*)    Hemoglobin 10.5 (*)    HCT 31.0 (*)    All other components within normal limits  ETHANOL  PROTIME-INR  APTT  RAPID URINE DRUG SCREEN, HOSP PERFORMED  URINALYSIS, ROUTINE W REFLEX MICROSCOPIC    EKG None  Radiology DG Tibia/Fibula Right  Result Date: 03/19/2022 CLINICAL DATA:  Abrasion, evaluate for trauma, altered mental status EXAM: RIGHT TIBIA AND FIBULA - 2 VIEW COMPARISON:  None Available. FINDINGS: There is no evidence of fracture or other focal bone lesions. Small metallic foreign body adjacent to the mid fibular diaphysis. Soft tissues otherwise unremarkable. IMPRESSION: 1.  No fracture or dislocation of the right tibia or fibula. 2. Small metallic foreign body adjacent to the mid fibular diaphysis, of uncertain nature or chronicity. Electronically Signed   By: Delanna Ahmadi M.D.   On: 03/19/2022 17:30   CT ANGIO HEAD NECK W WO CM W PERF (CODE STROKE)  Result Date: 03/19/2022 CLINICAL DATA:  Neuro deficit, acute, stroke suspected. EXAM: CT ANGIOGRAPHY HEAD AND NECK CT PERFUSION BRAIN TECHNIQUE: Multidetector CT imaging of the head and neck was performed using the standard protocol during bolus administration of intravenous contrast. Multiplanar CT image reconstructions and MIPs were obtained to evaluate the vascular anatomy. Carotid stenosis measurements (when applicable) are obtained utilizing NASCET criteria, using the distal internal carotid diameter as the denominator. Multiphase CT imaging of the brain was performed following IV bolus contrast injection. Subsequent parametric perfusion maps were calculated using RAPID software. RADIATION DOSE REDUCTION: This exam was performed according to the departmental dose-optimization program which includes automated exposure control, adjustment of the mA and/or kV according to patient size and/or use  of iterative reconstruction technique. CONTRAST:  159m OMNIPAQUE IOHEXOL 350 MG/ML SOLN COMPARISON:  Head CT immediately prior FINDINGS: CTA NECK FINDINGS Aortic arch: Minimal atherosclerosis. Branching pattern is normal without flow limiting stenosis. Right carotid system: Common carotid artery widely patent to the bifurcation. Carotid bifurcation is normal without soft or calcified plaque. Cervical ICA is normal. Left carotid system: Common carotid artery widely patent to the bifurcation. Minimal scattered plaque but no stenosis. Soft and calcified plaque at the carotid bifurcation but no stenosis. Cervical ICA widely patent. Vertebral arteries: Both vertebral artery origins are widely patent. The right is dominant. Both vessels are widely patent through the cervical region to the foramen magnum. Skeleton: Ordinary spondylosis. Other neck: No mass or lymphadenopathy. Upper chest: Mild upper lobe scarring.  No active process. Review of the MIP images confirms the above findings CTA HEAD FINDINGS Anterior circulation: Both internal carotid arteries are widely patent through the skull base and siphon regions. Mild siphon atherosclerotic calcification but no stenosis. The anterior and middle cerebral vessels are patent. No large vessel occlusion or proximal stenosis. No aneurysm or vascular malformation. Displacement of the brain and vascular structures on the left because of the left convexity subdural hematoma. No evidence of gross active extravasation. Maximal thickness of the hematoma remains 18 mm. Posterior circulation: Both vertebral arteries patent through the foramen magnum. The left terminates in PICA. The right supplies the basilar artery. Both superior cerebellar and posterior cerebral arteries are patent. Posterior cerebral arteries receive most of there supply from the anterior circulation. Venous sinuses: Patent and normal. Anatomic variants: None significant. Review of the MIP images confirms the  above findings CT Brain Perfusion Findings: ASPECTS: 10 CBF (<30%) Volume: 0 mL, allowing for artifact trip beaded to the subdural hematoma. Perfusion (Tmax>6.0s) volume: 049m allowing for artifact trip viewed to the subdural hematoma. Mismatch Volume: 8m26mnfarction Location:None IMPRESSION: 1. No large vessel occlusion or proximal stenosis. 2. No aneurysm or vascular malformation. 3. No evidence of core infarction  or penumbra. 4. Large left convexity subdural hematoma with maximal thickness of 18 mm. Displacement of the brain and vascular structures on the left because of the subdural hematoma. No evidence of gross active extravasation. No change in size from the prior CT. 5. These results were communicated to Dr. Leonel Ramsay at 5:01 pm on 03/19/2022 by text page via the [facility] messaging system. 6. Aortic atherosclerosis. Aortic Atherosclerosis (ICD10-I70.0). Electronically Signed   By: Nelson Chimes M.D.   On: 03/19/2022 17:01   CT HEAD CODE STROKE WO CONTRAST  Result Date: 03/19/2022 CLINICAL DATA:  Code stroke.  Altered mental status.  Subdural EXAM: CT HEAD WITHOUT CONTRAST TECHNIQUE: Contiguous axial images were obtained from the base of the skull through the vertex without intravenous contrast. RADIATION DOSE REDUCTION: This exam was performed according to the departmental dose-optimization program which includes automated exposure control, adjustment of the mA and/or kV according to patient size and/or use of iterative reconstruction technique. COMPARISON:  01/30/14 CT head FINDINGS: Brain: There is a 1.1 cm mixed density subdural hematoma along the left cerebral convexity. There is approximately 6 mm rightward midline shift. There is effacement of the sulci in the left cerebral hemisphere. There is chronic mineralization of the bilateral basal ganglia. Compared to 2015 there is asymmetric enlargement of the right lateral ventricle, which is likely related to interval volume loss and mass effect on the  left lateral ventricle. Vascular: No hyperdense vessel or unexpected calcification. Skull: Normal. Negative for fracture or focal lesion. Sinuses/Orbits: No acute finding.  Bilateral lens replacement. Other: None. ASPECTS ([place] Stroke Program Early CT Score): 10 IMPRESSION: 1. No CT evidence of an acute infarct. 2. 1.1 cm mixed density subdural hematoma along the left cerebral convexity with 6 mm rightward midline shift and effacement of the sulci in the left cerebral hemisphere. 3. ASPECTS is 10. Findings were discussed with Dr. Maylon Peppers on 03/19/22 at 4:43 PM Electronically Signed   By: Marin Roberts M.D.   On: 03/19/2022 16:43    Procedures .Critical Care  Performed by: Kemper Durie, DO Authorized by: Kemper Durie, DO   Critical care provider statement:    Critical care time (minutes):  40   Critical care time was exclusive of:  Separately billable procedures and treating other patients   Critical care was necessary to treat or prevent imminent or life-threatening deterioration of the following conditions:  CNS failure or compromise   Critical care was time spent personally by me on the following activities:  Development of treatment plan with patient or surrogate, discussions with consultants, discussions with primary provider, evaluation of patient's response to treatment, examination of patient, obtaining history from patient or surrogate, ordering and performing treatments and interventions, ordering and review of laboratory studies, ordering and review of radiographic studies, re-evaluation of patient's condition and review of old charts   I assumed direction of critical care for this patient from another provider in my specialty: no     Care discussed with: admitting provider       Medications Ordered in ED Medications  iohexol (OMNIPAQUE) 350 MG/ML injection 100 mL (100 mLs Intravenous Contrast Given 03/19/22 1639)  levETIRAcetam (KEPPRA) IVPB 500 mg/100 mL premix (0  mg Intravenous Stopped 03/19/22 1753)  lactated ringers bolus 1,000 mL (1,000 mLs Intravenous New Bag/Given 03/19/22 1753)    ED Course/ Medical Decision Making/ A&P Clinical Course as of 03/19/22 1759  Sun Mar 19, 2022  1729 I spoke with Dr. Kathyrn Sheriff of neurosurgery who recommended admission to  hospitalist at [facility]. He states SDH does not appear acute and recommends evaluate for additional causes of AMS. He recommends Keppra 500 mg BID and SBP goal <160. He states he will evaluate the patient at [facility] and discuss with family about possible surgical intervention. [VK]    Clinical Course User Index [VK] Kemper Durie, DO                           Medical Decision Making This patient presents to the ED with chief complaint(s) of AMS with pertinent past medical history of HTN, DM, CKD which further complicates the presenting complaint. The complaint involves an extensive differential diagnosis and also carries with it a high risk of complications and morbidity.    The differential diagnosis includes CVA, TIA, ICH/mass effect, electrolyte abnormality, infection, sepsis  Additional history obtained: Additional history obtained from family Records reviewed Primary Care Documents  ED Course and Reassessment: Due to patient's receptive aphasia and right-sided neglect, he was made a stroke alert upon his arrival and teleneurology was immediately consulted.  His Accu-Chek was in the 300s on arrival and blood pressure was controlled in the 150s.  He was immediately transferred to [facility].  CT showed evidence of a right-sided subdural hematoma with 6 mm midline shift.  Neurology recommended neurosurgery consult.  He is not a tPA candidate due to timing of last known well and bleed on CT head.  Independent labs interpretation:  The following labs were independently interpreted: Hyperglycemia otherwise within normal range  Independent visualization of imaging: - I independently visualized the  following imaging with scope of interpretation limited to determining acute life threatening conditions related to emergency care: CT head, which revealed subdural hematoma with midline shift  Consultation: - Consulted or discussed management/test interpretation w/ external professional: Neurology, neurosurgery, hospitalist  Consideration for admission or further workup: Patient requires admission for further workup and management of his subdural hematoma with altered mental status Social Determinants of health: N/A    Amount and/or Complexity of Data Reviewed Labs: ordered. Radiology: ordered.  Risk Prescription drug management. Decision regarding hospitalization.          Final Clinical Impression(s) / ED Diagnoses Final diagnoses:  Subdural hematoma ([place])  Hyperglycemia    Rx / DC Orders ED Discharge Orders     None         Kemper Durie, DO 03/19/22 1759

## 2022-03-19 NOTE — Progress Notes (Addendum)
Telestroke Note  1625: Code stroke cart activated at this time. Patient arrived to the ED via private vehicle at 1556.   1627: Patient left for CT at this time.   1628: Dr. Leonel Ramsay paged at this time.   1630: Dr. Leonel Ramsay on camera at this time.   1641: Dr. Leonel Ramsay speaking with family and obtaining patient history and LKW. LKW 1030 per family.   1648: Patient returned from CT. Dr. Leonel Ramsay performing neuro exam at this time.   Berenice Bouton Telestroke RN

## 2022-03-19 NOTE — ED Notes (Signed)
Dr. Maylon Peppers called to bedside to evaluate pt. Notified me to activate code stroke. See documentation for times

## 2022-03-20 ENCOUNTER — Inpatient Hospital Stay (HOSPITAL_COMMUNITY): Payer: Medicare HMO

## 2022-03-20 DIAGNOSIS — S065XAA Traumatic subdural hemorrhage with loss of consciousness status unknown, initial encounter: Secondary | ICD-10-CM | POA: Diagnosis not present

## 2022-03-20 LAB — CBC
HCT: 31.9 % — ABNORMAL LOW (ref 39.0–52.0)
Hemoglobin: 10.3 g/dL — ABNORMAL LOW (ref 13.0–17.0)
MCH: 30.1 pg (ref 26.0–34.0)
MCHC: 32.3 g/dL (ref 30.0–36.0)
MCV: 93.3 fL (ref 80.0–100.0)
Platelets: 106 10*3/uL — ABNORMAL LOW (ref 150–400)
RBC: 3.42 MIL/uL — ABNORMAL LOW (ref 4.22–5.81)
RDW: 16.1 % — ABNORMAL HIGH (ref 11.5–15.5)
WBC: 9.8 10*3/uL (ref 4.0–10.5)
nRBC: 0 % (ref 0.0–0.2)

## 2022-03-20 LAB — BASIC METABOLIC PANEL
Anion gap: 2 — ABNORMAL LOW (ref 5–15)
BUN: 13 mg/dL (ref 8–23)
CO2: 28 mmol/L (ref 22–32)
Calcium: 8 mg/dL — ABNORMAL LOW (ref 8.9–10.3)
Chloride: 108 mmol/L (ref 98–111)
Creatinine, Ser: 1.14 mg/dL (ref 0.61–1.24)
GFR, Estimated: >60 mL/min (ref >60)
Glucose, Bld: 122 mg/dL — ABNORMAL HIGH (ref 70–99)
Potassium: 3.4 mmol/L — ABNORMAL LOW (ref 3.5–5.1)
Sodium: 138 mmol/L (ref 135–145)

## 2022-03-20 LAB — CBG MONITORING, ED
Glucose-Capillary: 153 mg/dL — ABNORMAL HIGH (ref 70–99)
Glucose-Capillary: 107 mg/dL — ABNORMAL HIGH (ref 70–99)
Glucose-Capillary: 108 mg/dL — ABNORMAL HIGH (ref 70–99)
Glucose-Capillary: 99 mg/dL (ref 70–99)

## 2022-03-20 MED ORDER — HYDRALAZINE HCL 20 MG/ML IJ SOLN
10.0000 mg | Freq: Four times a day (QID) | INTRAMUSCULAR | Status: DC | PRN
  Administered 2022-03-21: 10 mg via INTRAVENOUS
  Filled 2022-03-20: qty 1

## 2022-03-20 NOTE — ED Notes (Signed)
Family updated as to patient's status.

## 2022-03-20 NOTE — Progress Notes (Signed)
PROGRESS NOTE     Jacob Barnes, is a 81 y.o. male, DOB - 12/31/1940, VOH:[id number]  Admit date - 03/19/2022   Admitting Physician Bethena Roys, MD  Outpatient Primary MD for the patient is Fayrene Helper, MD  LOS - 1  Chief Complaint  Patient presents with   Code Stroke        Brief Narrative:   81 y.o. male with medical history significant for diabetes mellitus, hypertension, mild dementia, CKD 3, retroperitoneal hematoma admitted on 03/19/2022 with acute metabolic encephalopathy in the setting of subdural hematoma -Awaiting transfer to [facility] for neurosurgical evaluation    -Assessment and Plan: 1) Subdural hematoma - -Family not aware of recent falls or head trauma.  -Neurodeficits including speech vision and right-sided weakness as well as disorientation/confusion and lethargy persist - he is not on anticoagulation or antiplatelet. -Repeat CT head on 03/20/2022 with no worsening on initial CT head from 03/19/2022, degree of cerebral edema/mass effect may actually be slightly better -- EDP consulted neurosurgery-Dr. Nundkumar-recommended admission to [facility] with neurosurgical evaluation on when patient gets to [place] scheduled Keppra and as needed lorazepam for seizure concerns -Keep systolic goal blood pressure less than 160,  -As per neurosurgeon SDH does not appear acute,  -UA not suggestive of UTI -Chest x-ray without evidence of significant infection -Neurosurgeon Dr. Kathyrn Sheriff, recommends against mannitol at this time -Neurosurgeon also recommends against ED to ED transfer at this time as per neurosurgical attending no plans for emergent surgery -N.p.o. -Continue IV fluidsc/n IVF .  2)Acute hypoxic respiratory failure (HCC) -Stable, no significant cardiopulmonary process identified -Suspect this is related to #1 above - 3)Mild Dementia [facility]) -Patient was quite functional prior to presenting with subdural hematoma/metabolic  encephalopathy  -Previously occasional short term memory deficits, otherwise he is able to take care of himself and still drives.  4)DM2- -when she was 9.1 in October-reflecting uncontrolled DM with hyperglycemia -Hold home Tresiba, linagliptin-while NPO Use Novolog/Humalog Sliding scale insulin with Accu-Cheks/Fingersticks as ordered   5)AKI----acute kidney injury  -Creatinine was 1.60 on admission --creatinine has normalized with hydration - renally adjust medications, avoid nephrotoxic agents / dehydration  / hypotension  6)HTN-stage II Blood pressure elevated initially 150s now up to 694W systolic.  Goal blood pressure with subdural hematoma per neurosurgery- < 160. - Cardene drip Was discontinued after achieving BP goals  7)Social/Ethics--- overall prognosis is not great -Updated patient's sister (Ms Rocco Pauls) and nephew at bedside, questions answered -Patient's sister states that she will update patient's wife and kids -Get palliative care consult for goals of care  8) chronic anemia--- anticipate further drop in Hgb with IV fluids/hydration/hemodilution  9) hypokalemia--replaced and recheck  Disposition/Need for in-Hospital Stay- patient unable to be discharged at this time due to -subdural hematoma and altered mentation.... Currently requiring IV fluids, not safe for oral intake  Status is: Inpatient   Disposition: The patient is from: Home              Anticipated d/c is to:  TBD              Anticipated d/c date is: > 3 days              Patient currently is not medically stable to d/c. Barriers: Not Clinically Stable-   Code Status :  -  Code Status: Full Code   Family Communication:   Discussed with pt's sister (patient's sister (Ms Rocco Pauls)  and nephew at bedside  DVT Prophylaxis  :   - SCDs  SCDs Start: 03/19/22 2116   Lab Results  Component Value Date   PLT 106 (L) 03/20/2022   Inpatient Medications  Scheduled Meds:  insulin aspart   0-9 Units Subcutaneous Q6H   Continuous Infusions:  0.9 % NaCl with KCl 20 mEq / L 50 mL/hr at 03/20/22 0537   levETIRAcetam Stopped (03/20/22 0252)   niCARDipine Stopped (03/19/22 2043)   PRN Meds:.acetaminophen **OR** acetaminophen, hydrALAZINE, LORazepam, ondansetron **OR** ondansetron (ZOFRAN) IV   Anti-infectives (From admission, onward)    None       Subjective: Jacob Barnes today has no fevers, no emesis,  No chest pain,    patient's sister (Ms Rocco Pauls) and Nephew are at bedside  Objective: Vitals:   03/20/22 0930 03/20/22 1000 03/20/22 1030 03/20/22 1100  BP: (!) 146/73 129/64 123/67 (!) 142/74  Pulse: 66 (!) 54 (!) 56 (!) 56  Resp: '17 10 10 12  '$ Temp:      TempSrc:      SpO2: 100% 100% 100% 100%  Weight:      Height:        Intake/Output Summary (Last 24 hours) at 03/20/2022 1119 Last data filed at 03/20/2022 0537 Gross per 24 hour  Intake 498.14 ml  Output 875 ml  Net -376.86 ml   Filed Weights   03/19/22 1611  Weight: 52.2 kg    Physical Exam  Gen:-Lethargic wakes up to verbal stimuli  HEENT:- [place].AT, No sclera icterus Neck-Supple Neck,No JVD,.  Lungs-  CTAB , fair symmetrical air movement CV- S1, S2 normal, regular  Abd-  +ve B.Sounds, Abd Soft, No tenderness,    Extremity/Skin:- No  edema, pedal pulses present  Neuro/Psych- limited neuropsych exam as patient is not able to follow instructions , very lethargic, he will wake up to voice, but falls back asleep--- appears to moving all extremities independently  Data Reviewed: I have personally reviewed following labs and imaging studies  CBC: Recent Labs  Lab 03/19/22 1615 03/19/22 1652 03/20/22 0532  WBC 7.1  --  9.8  NEUTROABS 3.5  --   --   HGB 9.9* 10.5* 10.3*  HCT 30.9* 31.0* 31.9*  MCV 93.4  --  93.3  PLT 112*  --  035*   Basic Metabolic Panel: Recent Labs  Lab 03/19/22 1615 03/19/22 1652 03/20/22 0532  NA 136 140 138  K 3.6 3.7 3.4*  CL 100 98 108  CO2 28  --  28   GLUCOSE 375* 378* 122*  BUN '18 16 13  '$ CREATININE 1.52* 1.60* 1.14  CALCIUM 8.3*  --  8.0*   GFR: Estimated Creatinine Clearance: 36.9 mL/min (by C-G formula based on SCr of 1.14 mg/dL). Liver Function Tests: Recent Labs  Lab 03/19/22 1615  AST 25  ALT 16  ALKPHOS 101  BILITOT 0.4  PROT 8.2*  ALBUMIN 3.5   Radiology Studies: CT HEAD WO CONTRAST (5MM)  Result Date: 03/20/2022 CLINICAL DATA:  Subdural hematoma EXAM: CT HEAD WITHOUT CONTRAST TECHNIQUE: Contiguous axial images were obtained from the base of the skull through the vertex without intravenous contrast. RADIATION DOSE REDUCTION: This exam was performed according to the departmental dose-optimization program which includes automated exposure control, adjustment of the mA and/or kV according to patient size and/or use of iterative reconstruction technique. COMPARISON:  CT head 03/19/2022. FINDINGS: Brain: When remeasured on the prior, similar size of a left cerebral convexity mixed density subdural hematoma which measures up to 1.6 cm in thickness.  Similar versus slightly improved mass effect with 4-5 mm of rightward midline shift. No evidence of acute large vascular territory infarct filled vessel lesion, or hydrocephalus. Similar effacement of the left lateral ventricle. Vascular: No hyperdense vessel. Skull: No acute fracture. Sinuses/Orbits: Clear sinuses.  No acute orbital findings. Other: No mastoid effusions. IMPRESSION: When remeasured on the prior, similar size of a left cerebral convexity mixed density subdural hematoma which measures up to 1.6 cm in thickness. Similar versus slightly improved mass effect with 4-5 mm of rightward midline shift. Electronically Signed   By: Margaretha Sheffield M.D.   On: 03/20/2022 09:18   DG CHEST PORT 1 VIEW  Result Date: 03/19/2022 CLINICAL DATA:  Hypoxia EXAM: PORTABLE CHEST 1 VIEW COMPARISON:  05/04/2021 FINDINGS: Cardiac shadow is stable. Aortic calcifications are noted. Mild increased  interstitial changes are noted consistent with edema. No focal infiltrate or effusion is noted. No bony abnormality is noted. IMPRESSION: Changes consistent with mild parenchymal edema. Electronically Signed   By: Inez Catalina M.D.   On: 03/19/2022 21:01   DG Tibia/Fibula Right  Result Date: 03/19/2022 CLINICAL DATA:  Abrasion, evaluate for trauma, altered mental status EXAM: RIGHT TIBIA AND FIBULA - 2 VIEW COMPARISON:  None Available. FINDINGS: There is no evidence of fracture or other focal bone lesions. Small metallic foreign body adjacent to the mid fibular diaphysis. Soft tissues otherwise unremarkable. IMPRESSION: 1.  No fracture or dislocation of the right tibia or fibula. 2. Small metallic foreign body adjacent to the mid fibular diaphysis, of uncertain nature or chronicity. Electronically Signed   By: Delanna Ahmadi M.D.   On: 03/19/2022 17:30   CT ANGIO HEAD NECK W WO CM W PERF (CODE STROKE)  Result Date: 03/19/2022 CLINICAL DATA:  Neuro deficit, acute, stroke suspected. EXAM: CT ANGIOGRAPHY HEAD AND NECK CT PERFUSION BRAIN TECHNIQUE: Multidetector CT imaging of the head and neck was performed using the standard protocol during bolus administration of intravenous contrast. Multiplanar CT image reconstructions and MIPs were obtained to evaluate the vascular anatomy. Carotid stenosis measurements (when applicable) are obtained utilizing NASCET criteria, using the distal internal carotid diameter as the denominator. Multiphase CT imaging of the brain was performed following IV bolus contrast injection. Subsequent parametric perfusion maps were calculated using RAPID software. RADIATION DOSE REDUCTION: This exam was performed according to the departmental dose-optimization program which includes automated exposure control, adjustment of the mA and/or kV according to patient size and/or use of iterative reconstruction technique. CONTRAST:  182m OMNIPAQUE IOHEXOL 350 MG/ML SOLN COMPARISON:  Head CT  immediately prior FINDINGS: CTA NECK FINDINGS Aortic arch: Minimal atherosclerosis. Branching pattern is normal without flow limiting stenosis. Right carotid system: Common carotid artery widely patent to the bifurcation. Carotid bifurcation is normal without soft or calcified plaque. Cervical ICA is normal. Left carotid system: Common carotid artery widely patent to the bifurcation. Minimal scattered plaque but no stenosis. Soft and calcified plaque at the carotid bifurcation but no stenosis. Cervical ICA widely patent. Vertebral arteries: Both vertebral artery origins are widely patent. The right is dominant. Both vessels are widely patent through the cervical region to the foramen magnum. Skeleton: Ordinary spondylosis. Other neck: No mass or lymphadenopathy. Upper chest: Mild upper lobe scarring.  No active process. Review of the MIP images confirms the above findings CTA HEAD FINDINGS Anterior circulation: Both internal carotid arteries are widely patent through the skull base and siphon regions. Mild siphon atherosclerotic calcification but no stenosis. The anterior and middle cerebral vessels are patent. No large  vessel occlusion or proximal stenosis. No aneurysm or vascular malformation. Displacement of the brain and vascular structures on the left because of the left convexity subdural hematoma. No evidence of gross active extravasation. Maximal thickness of the hematoma remains 18 mm. Posterior circulation: Both vertebral arteries patent through the foramen magnum. The left terminates in PICA. The right supplies the basilar artery. Both superior cerebellar and posterior cerebral arteries are patent. Posterior cerebral arteries receive most of there supply from the anterior circulation. Venous sinuses: Patent and normal. Anatomic variants: None significant. Review of the MIP images confirms the above findings CT Brain Perfusion Findings: ASPECTS: 10 CBF (<30%) Volume: 0 mL, allowing for artifact trip beaded  to the subdural hematoma. Perfusion (Tmax>6.0s) volume: 26m, allowing for artifact trip viewed to the subdural hematoma. Mismatch Volume: 060mInfarction Location:None IMPRESSION: 1. No large vessel occlusion or proximal stenosis. 2. No aneurysm or vascular malformation. 3. No evidence of core infarction or penumbra. 4. Large left convexity subdural hematoma with maximal thickness of 18 mm. Displacement of the brain and vascular structures on the left because of the subdural hematoma. No evidence of gross active extravasation. No change in size from the prior CT. 5. These results were communicated to Dr. KiLeonel Ramsayt 5:01 pm on 03/19/2022 by text page via the [facility] system. 6. Aortic atherosclerosis. Aortic Atherosclerosis (ICD10-I70.0). Electronically Signed   By: MaNelson Chimes.D.   On: 03/19/2022 17:01   CT HEAD CODE STROKE WO CONTRAST  Result Date: 03/19/2022 CLINICAL DATA:  Code stroke.  Altered mental status.  Subdural EXAM: CT HEAD WITHOUT CONTRAST TECHNIQUE: Contiguous axial images were obtained from the base of the skull through the vertex without intravenous contrast. RADIATION DOSE REDUCTION: This exam was performed according to the departmental dose-optimization program which includes automated exposure control, adjustment of the mA and/or kV according to patient size and/or use of iterative reconstruction technique. COMPARISON:  01/30/14 CT head FINDINGS: Brain: There is a 1.1 cm mixed density subdural hematoma along the left cerebral convexity. There is approximately 6 mm rightward midline shift. There is effacement of the sulci in the left cerebral hemisphere. There is chronic mineralization of the bilateral basal ganglia. Compared to 2015 there is asymmetric enlargement of the right lateral ventricle, which is likely related to interval volume loss and mass effect on the left lateral ventricle. Vascular: No hyperdense vessel or unexpected calcification. Skull: Normal. Negative for  fracture or focal lesion. Sinuses/Orbits: No acute finding.  Bilateral lens replacement. Other: None. ASPECTS (AlRosaliatroke Program Early CT Score): 10 IMPRESSION: 1. No CT evidence of an acute infarct. 2. 1.1 cm mixed density subdural hematoma along the left cerebral convexity with 6 mm rightward midline shift and effacement of the sulci in the left cerebral hemisphere. 3. ASPECTS is 10. Findings were discussed with Dr. KiMaylon Peppersn 03/19/22 at 4:43 PM Electronically Signed   By: HeMarin Roberts.D.   On: 03/19/2022 16:43    Scheduled Meds:  insulin aspart  0-9 Units Subcutaneous Q6H   Continuous Infusions:  0.9 % NaCl with KCl 20 mEq / L 50 mL/hr at 03/20/22 0537   levETIRAcetam Stopped (03/20/22 0252)   niCARDipine Stopped (03/19/22 2043)    LOS: 1 day   CoRoxan Hockey.D on 03/20/2022 at 11:19 AM  Go to [email or web address] - for contact info  [facility] - Office  [phone] 7PM-7AM, please contact night-coverage [email or web address] 03/20/2022, 11:19 AM

## 2022-03-20 NOTE — ED Notes (Signed)
CBG not crossing over into Epic. CBG was 107 at 0828.

## 2022-03-20 NOTE — ED Notes (Addendum)
Patient transported to CT with RN at bedside. 

## 2022-03-20 NOTE — ED Notes (Signed)
Dr. Denton Brick notified of pt's increasing NIH and difference in neuro exam compared to previous shift RN's report this morning. MD coming to bedside to evaluate pt.

## 2022-03-20 NOTE — ED Notes (Signed)
This RN called [facility]W. No answer at this time

## 2022-03-20 NOTE — TOC Progression Note (Signed)
  Transition of Care [facility]) Screening Note   Patient Details  Name: Jacob Barnes Date of Birth: Jan 13, 1941   Transition of Care [facility]) CM/SW Contact:    Boneta Lucks, RN Phone Number: 03/20/2022, 10:49 AM  Patient still in ED for workup of Subdural hematoma, TOC following  Transition of Care Department [facility]) has reviewed patient and no TOC needs have been identified at this time. We will continue to monitor patient advancement through interdisciplinary progression rounds. If new patient transition needs arise, please place a TOC consult.      Barriers to Discharge: Continued Medical Work up

## 2022-03-21 ENCOUNTER — Telehealth: Payer: Self-pay | Admitting: "Endocrinology

## 2022-03-21 ENCOUNTER — Other Ambulatory Visit (HOSPITAL_COMMUNITY): Payer: Self-pay

## 2022-03-21 DIAGNOSIS — N183 Chronic kidney disease, stage 3 unspecified: Secondary | ICD-10-CM

## 2022-03-21 LAB — GLUCOSE, CAPILLARY
Glucose-Capillary: 195 mg/dL — ABNORMAL HIGH (ref 70–99)
Glucose-Capillary: 208 mg/dL — ABNORMAL HIGH (ref 70–99)
Glucose-Capillary: 215 mg/dL — ABNORMAL HIGH (ref 70–99)
Glucose-Capillary: 381 mg/dL — ABNORMAL HIGH (ref 70–99)
Glucose-Capillary: 73 mg/dL (ref 70–99)

## 2022-03-21 LAB — BASIC METABOLIC PANEL
Anion gap: 7 (ref 5–15)
BUN: 9 mg/dL (ref 8–23)
CO2: 27 mmol/L (ref 22–32)
Calcium: 8.1 mg/dL — ABNORMAL LOW (ref 8.9–10.3)
Chloride: 104 mmol/L (ref 98–111)
Creatinine, Ser: 1.03 mg/dL (ref 0.61–1.24)
GFR, Estimated: >60 mL/min (ref >60)
Glucose, Bld: 125 mg/dL — ABNORMAL HIGH (ref 70–99)
Potassium: 3.4 mmol/L — ABNORMAL LOW (ref 3.5–5.1)
Sodium: 138 mmol/L (ref 135–145)

## 2022-03-21 LAB — CBC
HCT: 31.1 % — ABNORMAL LOW (ref 39.0–52.0)
Hemoglobin: 10.1 g/dL — ABNORMAL LOW (ref 13.0–17.0)
MCH: 29.8 pg (ref 26.0–34.0)
MCHC: 32.5 g/dL (ref 30.0–36.0)
MCV: 91.7 fL (ref 80.0–100.0)
Platelets: 97 10*3/uL — ABNORMAL LOW (ref 150–400)
RBC: 3.39 MIL/uL — ABNORMAL LOW (ref 4.22–5.81)
RDW: 15.9 % — ABNORMAL HIGH (ref 11.5–15.5)
WBC: 7.2 10*3/uL (ref 4.0–10.5)
nRBC: 0 % (ref 0.0–0.2)

## 2022-03-21 MED ORDER — POTASSIUM CHLORIDE CRYS ER 20 MEQ PO TBCR
40.0000 meq | EXTENDED_RELEASE_TABLET | Freq: Once | ORAL | Status: AC
  Administered 2022-03-21: 40 meq via ORAL
  Filled 2022-03-21: qty 2

## 2022-03-21 MED ORDER — AMLODIPINE BESYLATE 5 MG PO TABS
5.0000 mg | ORAL_TABLET | Freq: Every day | ORAL | Status: DC
  Administered 2022-03-21 – 2022-03-28 (×7): 5 mg via ORAL
  Filled 2022-03-21 (×7): qty 1

## 2022-03-21 NOTE — Consult Note (Signed)
Chief Complaint   Chief Complaint  Patient presents with   Code Stroke    History of Present Illness  Jacob Barnes is a 81 y.o. male presenting to [facility] with his sister after they noted relatively acute onset of confusion.  History obtained primarily from the patient's sister. The patient apparently drove to his sister's eyes and was acting somewhat strange.  She also indicated that he was not using his right arm as much even though he is right-handed.  EMS was therefore called and he was taken to the hospital where CT scan demonstrated an acute on chronic left frontoparietal convexity subdural hematoma.  He was then transferred to [facility] for further care.  This morning, the patient's sister reports that he is nearly back to baseline, although does continue to be somewhat confused.  She states that his speech seems to be at baseline. She does not know of any recent trauma.   Of note, patient reports he has a history of diabetes, no known history of hypertension.  No previous heart attack or stroke.  He is not on any blood thinners/antiplatelet agents.  Patient's sister does not know of any any oncologic history.  Past Medical History   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   Anemia    Arthritis    hands   Carpal tunnel syndrome 08/05/2009   Qualifier: Diagnosis of  By: Aline Brochure MD, [name]     Chronic kidney disease    Diabetes mellitus    says since 1979 type 2   Diabetes mellitus without complication ([place])    Phreesia 04/03/2020   ERECTILE DYSFUNCTION, ORGANIC 01/20/2009   Qualifier: Diagnosis of  By: Moshe Cipro MD, Margaret     GERD (gastroesophageal reflux disease)    Glaucoma    both eyes   History of kidney stones    Hyperlipemia    Hypertension    Iron deficiency anemia 01/10/2014   Other pancytopenia ([place]) 01/10/2014   New in 01/2014, undergoing  pathology review    Rash    front abdomen no drainage    Past Surgical History   Past Surgical History:  Procedure Laterality Date   AMPUTATION Right 09/01/2016   Procedure: AMPUTATION RIGHT MIDDLE FINGER TIP;  Surgeon: Mcarthur Rossetti, MD;  Location: [facility];  Service: Orthopedics;  Laterality: Right;   AMPUTATION Right 10/10/2016   Procedure: REPEAT IRRIGATION AND DEBRIDEMENT RIGHT MIDDLE FINGER WITH AMPUTATION THROUGH PROXIMAL PHALANX;  Surgeon: Mcarthur Rossetti, MD;  Location: [employer];  Service: Orthopedics;  Laterality: Right;   APPENDECTOMY     BIOPSY  01/13/2022   Procedure: BIOPSY;  Surgeon: Harvel Quale, MD;  Location: [facility];  Service: Gastroenterology;;   COLONOSCOPY  09/06/2011   Procedure: COLONOSCOPY;  Surgeon: Rogene Houston, MD;  Location: [facility];  Service: Endoscopy;  Laterality: N/A;  830   COLONOSCOPY WITH PROPOFOL N/A 01/13/2022   Procedure: COLONOSCOPY WITH PROPOFOL;  Surgeon: Harvel Quale, MD;  [facility]: [facility];  Service: Gastroenterology;  Laterality: N/A;  200 ASA 3   CYSTOSCOPY/RETROGRADE/URETEROSCOPY/STONE EXTRACTION WITH BASKET     ESOPHAGOGASTRODUODENOSCOPY N/A 01/15/2014   Procedure: ESOPHAGOGASTRODUODENOSCOPY (EGD);  Surgeon: Rogene Houston, MD;  [facility]: [facility];  Service: Endoscopy;  Laterality: N/A;  200   ESOPHAGOGASTRODUODENOSCOPY N/A 04/22/2014   Procedure: ESOPHAGOGASTRODUODENOSCOPY (EGD);  Surgeon:  Rogene Houston, MD;  [facility];  Service: Endoscopy;  Laterality: N/A;  240   EYE SURGERY Bilateral yrs ago   ioc for cataract   MALONEY DILATION N/A 01/15/2014   Procedure: MALONEY DILATION;  Surgeon: Rogene Houston, MD;  [facility];  Service: Endoscopy;  Laterality: N/A;   MALONEY DILATION  04/22/2014   Procedure: Venia Minks DILATION;  Surgeon: Rogene Houston, MD;  [facility];  Service: Endoscopy;;   POLYPECTOMY  01/13/2022   Procedure: POLYPECTOMY;   Surgeon: Harvel Quale, MD;  [facility];  Service: Gastroenterology;;   surgery for left eye bleedf  2017    Social History   Social History   Tobacco Use   Smoking status: Never    Passive exposure: Never   Smokeless tobacco: Never  Vaping Use   Vaping Use: Never used  Substance Use Topics   Alcohol use: No   Drug use: No    Medications   Prior to Admission medications   Medication Sig Start Date End Date Taking? Authorizing Provider  benazepril (LOTENSIN) 20 MG tablet Take 1 tablet (20 mg total) by mouth daily. 02/16/22  Yes Fayrene Helper, MD  CALCIUM PO Take 1 tablet by mouth 3 (three) times a week.   Yes [provider]  Cholecalciferol (VITAMIN D3 PO) Take 1 tablet by mouth 3 (three) times a week.   Yes [provider]  insulin degludec (TRESIBA FLEXTOUCH) 100 UNIT/ML FlexTouch Pen Inject 5 Units into the skin at bedtime. 01/26/22  Yes Brita Romp, NP  linagliptin (TRADJENTA) 5 MG TABS tablet Take 1 tablet (5 mg total) by mouth daily with breakfast. 01/26/22  Yes Brita Romp, NP  rosuvastatin (CRESTOR) 5 MG tablet Take one tablet by mouth every Monday , Wednesday and Friday 12/07/20  Yes Fayrene Helper, MD  diphenoxylate-atropine (LOMOTIL) 2.5-0.025 MG tablet TAKE ONE TABLET BY MOUTH THREE TIMES WEEKLY AS NEEDED, FOR LOOSE STOOL Patient not taking: Reported on 03/20/2022 03/16/22   Fayrene Helper, MD  donepezil (ARICEPT) 10 MG tablet Take 1 tablet (10 mg total) by mouth at bedtime. Patient not taking: Reported on 03/20/2022 03/29/21   Fayrene Helper, MD  dorzolamide-timolol (COSOPT) 22.3-6.8 MG/ML ophthalmic solution Place 1 drop into both eyes 3 (three) times a week. Patient not taking: Reported on 03/20/2022 07/07/20   [provider]  Insulin Pen Needle (PEN NEEDLES) 31G X 6 MM MISC 1 each by Does not apply route at bedtime. 11/18/21   Brita Romp, NP  Iron, Ferrous Sulfate, 325 (65  Fe) MG TABS Take one tablet by mouth once daily Patient not taking: Reported on 03/20/2022 12/17/20   Fayrene Helper, MD  latanoprost (XALATAN) 0.005 % ophthalmic solution Place 1 drop into both eyes 3 (three) times a week. At bedtime Patient not taking: Reported on 03/20/2022 01/10/19   [provider]  pantoprazole (PROTONIX) 40 MG tablet TAKE 1 TABLET BY MOUTH EVERY DAY Patient not taking: Reported on 03/13/2022 06/27/21   Fayrene Helper, MD    Allergies   Allergies  Allergen Reactions   Alpha-Gal     Review of Systems  ROS  Neurologic Exam  Awake, alert, oriented person/hospital not year Speech fluent, appropriate, somewhat dysarthric CN grossly intact Motor exam: Upper Extremities Deltoid Bicep Tricep Grip  Right 5/5 5/5 5/5 5/5  Left 5/5 5/5 5/5 5/5   Lower Extremities IP Quad PF DF EHL  Right  5/5 5/5 5/5 5/5 5/5  Left 5/5 5/5 5/5 5/5 5/5   Sensation grossly intact to LT No pronator drift  Imaging  There is an acute on chronic left frontoparietal convexity SDH measuring 1.7cm with associated local mass effect. Minimal L->R MLS. No HCP  Impression  - 81 y.o. male with acute on chronic SDH, appears mildly symptomatic. I think the patient would be a candidate for MMA embolization in order to attempt to avoid surgery.  Plan  - Will further discuss with patient's sister, if agreeable will plan on MMA embolization tomorrow. - Cont keppra for now. - Cont supportive care per primary team  Consuella Lose, MD [facility]

## 2022-03-21 NOTE — Telephone Encounter (Signed)
Pt's wife said he needs a renewal on his libre within 30 days. Please Advise Call back # [phone] ([place]ever[place], Sister)

## 2022-03-21 NOTE — Progress Notes (Signed)
PROGRESS NOTE  Jacob Barnes PHX:[id number] DOB: [birth year] DOA: 03/19/2022 PCP: Fayrene Helper, MD   LOS: 2 days   Brief Narrative / Interim history: 81 year old male with history of mild dementia, DM2, HTN, CKD 3A with baseline creatinine 1.1-1.3, comes into the hospital with increased confusion, poor coordination per family.  He initially presented to [facility] ED, found to have a subdural hematoma, and then transferred to [facility] for neurosurgery evaluation  Subjective / 24h Interval events: He is doing well this morning.  Denies any chest pain, no shortness of breath, no abdominal pain, no nausea or vomiting.  He has no lightheadedness or dizziness.  Sister is at bedside and she tells me that he is more confused than normal  Assesement and Plan: Principal Problem:   Subdural hematoma (HCC) Active Problems:   Acute hypoxic respiratory failure (HCC)   Essential hypertension, benign   CKD (chronic kidney disease) stage 3, GFR 30-59 ml/min (HCC)   Uncontrolled type 2 diabetes mellitus with hyperglycemia (HCC)   Dementia (HCC)   Principal problem Subdural hematoma -there is no reported trauma or witnessed falls.  CT scan on admission showed left cerebral subdural hematoma with mass effect 4 - 5 millimeters rightward midline shift.  Repeat CT scan shows stability.  Awaiting neurosurgery input today.  Keep blood pressure less than 160, initially required Cardene drip but now off.  Start amlodipine  Active problems Acute metabolic encephalopathy-likely due to #1 as well as hospitalization.  Urinalysis not suggestive of UTI, chest x-ray without evidence of significant infection.  Hypoxemia-resolved, on room air  Mild dementia-apparently was quite functional prior to this presentation, able to take care of himself, doing all his ADLs, driving  [id number] controlled, with hyperglycemia.  Placed on sliding scale  CBG (last 3)  Recent Labs    03/20/22 1809 03/21/22 0010 03/21/22 0635   GLUCAP 99 215* 73   Acute kidney injury on chronic kidney disease stage IIIa-creatinine was 1.6 on admission, improved with fluids at 1.0 today.  Discontinue fluids  Hypokalemia-replete this morning  Essential hypertension-blood pressure controlled, start amlodipine  Chronic anemia-normocytic, hemoglobin stable, no bleeding  Scheduled Meds:  amLODipine  5 mg Oral Daily   insulin aspart  0-9 Units Subcutaneous Q6H   potassium chloride  40 mEq Oral Once   Continuous Infusions:  0.9 % NaCl with KCl 20 mEq / L 50 mL/hr at 03/20/22 1809   levETIRAcetam 500 mg (03/21/22 0252)   PRN Meds:.acetaminophen **OR** acetaminophen, hydrALAZINE, LORazepam, ondansetron **OR** ondansetron (ZOFRAN) IV  Current Outpatient Medications  Medication Instructions   benazepril (LOTENSIN) 20 mg, Oral, Daily   CALCIUM PO 1 tablet, Oral, 3 times weekly   Cholecalciferol (VITAMIN D3 PO) 1 tablet, Oral, 3 times weekly   diphenoxylate-atropine (LOMOTIL) 2.5-0.025 MG tablet TAKE ONE TABLET BY MOUTH THREE TIMES WEEKLY AS NEEDED, FOR LOOSE STOOL   donepezil (ARICEPT) 10 mg, Oral, Daily at bedtime   dorzolamide-timolol (COSOPT) 22.3-6.8 MG/ML ophthalmic solution 1 drop, 3 times weekly   Insulin Pen Needle (PEN NEEDLES) 31G X 6 MM MISC 1 each, Does not apply, Daily at bedtime   Iron, Ferrous Sulfate, 325 (65 Fe) MG TABS Take one tablet by mouth once daily   latanoprost (XALATAN) 0.005 % ophthalmic solution 1 drop, 3 times weekly   linagliptin (TRADJENTA) 5 mg, Oral, Daily with breakfast   pantoprazole (PROTONIX) 40 MG tablet TAKE 1 TABLET BY MOUTH EVERY DAY   rosuvastatin (CRESTOR) 5 MG tablet Take one tablet by mouth every  Monday , Wednesday and Friday   Tyler Aas FlexTouch 5 Units, Subcutaneous, Daily at bedtime    Diet Orders (From admission, onward)     Start     Ordered   03/20/22 1815  Diet full liquid Room service appropriate? Yes; Fluid consistency: Thin  Diet effective now       Question Answer Comment   Room service appropriate? Yes   Fluid consistency: Thin      03/20/22 1814            DVT prophylaxis: SCDs Start: 03/19/22 2116   Lab Results  Component Value Date   PLT 97 (L) 03/21/2022      Code Status: Full Code  Family Communication: Sister was present at bedside  Status is: Inpatient  Remains inpatient appropriate because: severity of illness  Level of care: Progressive  Consultants:  Neurosurgery   Objective: Vitals:   03/20/22 2245 03/21/22 0009 03/21/22 0443 03/21/22 0735  BP:  (!) 167/81 116/70 119/73  Pulse:  64 67 72  Resp:  '15 15 16  '$ Temp: 97.9 F (36.6 C) 97.9 F (36.6 C) 98.2 F (36.8 C) 98.4 F (36.9 C)  TempSrc: Oral Oral Oral   SpO2:  99% 99% 97%  Weight:      Height:        Intake/Output Summary (Last 24 hours) at 03/21/2022 0905 Last data filed at 03/21/2022 0446 Gross per 24 hour  Intake 94.66 ml  Output 1400 ml  Net -1305.34 ml   Wt Readings from Last 3 Encounters:  03/19/22 52.2 kg  03/13/22 52.1 kg  02/16/22 51.3 kg    Examination:  Constitutional: NAD Eyes: no scleral icterus ENMT: Mucous membranes are moist.  Neck: normal, supple Respiratory: clear to auscultation bilaterally, no wheezing, no crackles. Normal respiratory effort. No accessory muscle use.  Cardiovascular: Regular rate and rhythm, no murmurs / rubs / gallops. No LE edema.  Abdomen: non distended, no tenderness. Bowel sounds positive.  Musculoskeletal: no clubbing / cyanosis.  Skin: no rashes Neurologic: non focal   Data Reviewed: I have independently reviewed following labs and imaging studies   CBC Recent Labs  Lab 03/19/22 1615 03/19/22 1652 03/20/22 0532 03/21/22 0243  WBC 7.1  --  9.8 7.2  HGB 9.9* 10.5* 10.3* 10.1*  HCT 30.9* 31.0* 31.9* 31.1*  PLT 112*  --  106* 97*  MCV 93.4  --  93.3 91.7  MCH 29.9  --  30.1 29.8  MCHC 32.0  --  32.3 32.5  RDW 16.1*  --  16.1* 15.9*  LYMPHSABS 1.8  --   --   --   MONOABS 1.6*  --   --   --    EOSABS 0.0  --   --   --   BASOSABS 0.0  --   --   --     Recent Labs  Lab 03/19/22 1615 03/19/22 1652 03/20/22 0532 03/21/22 0243  NA 136 140 138 138  K 3.6 3.7 3.4* 3.4*  CL 100 98 108 104  CO2 28  --  28 27  GLUCOSE 375* 378* 122* 125*  BUN '18 16 13 9  '$ CREATININE 1.52* 1.60* 1.14 1.03  CALCIUM 8.3*  --  8.0* 8.1*  AST 25  --   --   --   ALT 16  --   --   --   ALKPHOS 101  --   --   --   BILITOT 0.4  --   --   --  ALBUMIN 3.5  --   --   --   INR 1.1  --   --   --     ------------------------------------------------------------------------------------------------------------------ No results for input(s): "CHOL", "HDL", "LDLCALC", "TRIG", "CHOLHDL", "LDLDIRECT" in the last 72 hours.  Lab Results  Component Value Date   HGBA1C 9.1 (A) 01/26/2022   ------------------------------------------------------------------------------------------------------------------ No results for input(s): "TSH", "T4TOTAL", "T3FREE", "THYROIDAB" in the last 72 hours.  Invalid input(s): "FREET3"  Cardiac Enzymes No results for input(s): "CKMB", "TROPONINI", "MYOGLOBIN" in the last 168 hours.  Invalid input(s): "CK" ------------------------------------------------------------------------------------------------------------------ No results found for: "BNP"  CBG: Recent Labs  Lab 03/20/22 0816 03/20/22 1232 03/20/22 1809 03/21/22 0010 03/21/22 0635  GLUCAP 107* 108* 99 215* 73    No results found for this or any previous visit (from the past 240 hour(s)).   Radiology Studies: CT HEAD WO CONTRAST (5MM)  Result Date: 03/20/2022 CLINICAL DATA:  Subdural hematoma EXAM: CT HEAD WITHOUT CONTRAST TECHNIQUE: Contiguous axial images were obtained from the base of the skull through the vertex without intravenous contrast. RADIATION DOSE REDUCTION: This exam was performed according to the departmental dose-optimization program which includes automated exposure control, adjustment of the  mA and/or kV according to patient size and/or use of iterative reconstruction technique. COMPARISON:  CT head 03/19/2022. FINDINGS: Brain: When remeasured on the prior, similar size of a left cerebral convexity mixed density subdural hematoma which measures up to 1.6 cm in thickness. Similar versus slightly improved mass effect with 4-5 mm of rightward midline shift. No evidence of acute large vascular territory infarct filled vessel lesion, or hydrocephalus. Similar effacement of the left lateral ventricle. Vascular: No hyperdense vessel. Skull: No acute fracture. Sinuses/Orbits: Clear sinuses.  No acute orbital findings. Other: No mastoid effusions. IMPRESSION: When remeasured on the prior, similar size of a left cerebral convexity mixed density subdural hematoma which measures up to 1.6 cm in thickness. Similar versus slightly improved mass effect with 4-5 mm of rightward midline shift. Electronically Signed   By: Margaretha Sheffield M.D.   On: 03/20/2022 09:18     Marzetta Board, MD, PhD [facility]  Between 7 am - 7 pm I am available, please contact me via Amion (for emergencies) or Securechat (non urgent messages)  Between 7 pm - 7 am I am not available, please contact night coverage MD/APP via Amion

## 2022-03-21 NOTE — Consult Note (Signed)
Consultation Note Date: 03/21/2022   Patient Name: Jacob Barnes  DOB: Jun 12, 1940  MRN: [id number]  Age / Sex: 81 y.o., male  PCP: Fayrene Helper, MD Referring Physician: Caren Griffins, MD  Reason for Consultation: Establishing goals of care and Psychosocial/spiritual support  HPI/Patient Profile: 81 y.o. male   admitted on 03/19/2022 with  acute onset of confusion.   Past medical  history of mild dementia, DM2, HTN, CKD 3A with baseline creatinine 1.1-1.3, comes into the hospital with increased confusion, poor coordination per family.  He initially presented to [facility] ED, found to have a subdural hematoma, and then transferred to [facility] for neurosurgery evaluation.    CT head 03/19/2022.   FINDINGS: Brain: When remeasured on the prior, similar size of a left cerebral convexity mixed density subdural hematoma which measures up to 1.6 cm in thickness. Similar versus slightly improved mass effect with 4-5 mm of rightward midline shift. No evidence of acute large vascular territory infarct filled vessel lesion, or hydrocephalus. Similar effacement of the left lateral ventricle.   Vascular: No hyperdense vessel.   Skull: No acute fracture.   Sinuses/Orbits: Clear sinuses.  No acute orbital findings.   Other: No mastoid effusions.   IMPRESSION: When remeasured on the prior, similar size of a left cerebral convexity mixed density subdural hematoma which measures up to 1.6 cm in thickness. Similar versus slightly improved mass effect with 4-5 mm of rightward midline shift.  PMT consult to explore with family GOCs and ACP    Clinical Assessment and Goals of Care:   This NP Wadie Lessen reviewed medical records, received report from team, assessed the patient and then spoke to his wife/ Awilda Bill  by phone   to discuss diagnosis, prognosis, GOC,  disposition and options.   Concept of  Palliative Care was introduced as specialized medical care for people and their families living with serious illness.  If focuses on providing relief from the symptoms and stress of a serious illness.  The goal is to improve quality of life for both the patient and the family.   Values and goals of care important to patient and family were attempted to be elicited.  Education offered on current medical situation .       A  discussion was had today regarding advanced directives.  Concepts specific to code status, artifical feeding and hydration, continued IV antibiotics and rehospitalization was had.    The difference between a aggressive medical intervention path  and a palliative comfort care path for this patient at this time was had.     Education on the importance of conversation and documentation of the next steps in treatment plan.  Plan is to meet tomorrow morning at 11:00 am  for further discussion regarding plan of care    Questions and concerns addressed.      PMT will continue to support holistically.       No documented HPOA or ACP documenters.   Next of kin/wife/ Awilda Bill  SUMMARY OF RECOMMENDATIONS    Code Status/Advance Care Planning: Full code   Palliative Prophylaxis:  Aspiration, Delirium Protocol, Frequent Pain Assessment, and Oral Care  Additional Recommendations (Limitations, Scope, Preferences): Full Scope Treatment  Psycho-social/Spiritual:  Desire for further Chaplaincy support:no   Prognosis:  Unable to determine  Discharge Planning: To Be Determined      Primary Diagnoses: Present on Admission:  Subdural hematoma ([place])  Uncontrolled type 2 diabetes mellitus with hyperglycemia (HCC)  Essential hypertension, benign  Dementia (HCC)  CKD (chronic kidney disease) stage 3, GFR 30-59 ml/min (HCC)  Acute hypoxic respiratory failure ([place])   I have reviewed the medical record, interviewed the patient and family, and examined the  patient. The following aspects are pertinent.  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   Anemia    Arthritis    hands   Carpal tunnel syndrome 08/05/2009   Qualifier: Diagnosis of  By: Aline Brochure MD, [name]     Chronic kidney disease    Diabetes mellitus    says since 1979 type 2   Diabetes mellitus without complication ([place])    Phreesia 04/03/2020   ERECTILE DYSFUNCTION, ORGANIC 01/20/2009   Qualifier: Diagnosis of  By: Moshe Cipro MD, Margaret     GERD (gastroesophageal reflux disease)    Glaucoma    both eyes   History of kidney stones    Hyperlipemia    Hypertension    Iron deficiency anemia 01/10/2014   Other pancytopenia ([place]) 01/10/2014   New in 01/2014, undergoing pathology review    Rash    front abdomen no drainage   Social History   Socioeconomic History   Marital status: Married    Spouse name: Not on file   Number of children: Not on file   Years of education: Not on file   Highest education level: Not on file  Occupational History   Not on file  Tobacco Use   Smoking status: Never    Passive exposure: Never   Smokeless tobacco: Never  Vaping Use   Vaping Use: Never used  Substance and Sexual Activity   Alcohol use: No   Drug use: No   Sexual activity: Yes  Other Topics Concern   Not on file  Social History Narrative   Not on file   Social Determinants of Health   Financial Resource Strain: Low Risk  (04/06/2020)   Overall Financial Resource Strain (CARDIA)    Difficulty of Paying Living Expenses: Not hard at all  Food Insecurity: No Food Insecurity (03/21/2022)   Hunger Vital Sign    Worried About Running Out of Food in the Last Year: Never true    Ran Out of Food in the Last Year: Never true  Transportation Needs: No Transportation Needs (03/21/2022)   PRAPARE - Hydrologist (Medical): No    Lack of  Transportation (Non-Medical): No  Physical Activity: Insufficiently Active (04/06/2020)   Exercise Vital Sign    Days of Exercise per Week: 5 days    Minutes of Exercise per Session: 10 min  Stress: No Stress Concern Present (04/06/2020)   [place]    Feeling of Stress : Not at all  Social Connections: Moderately Isolated (04/06/2020)   Social Connection and Isolation Panel [NHANES]    Frequency of Communication with Friends and Family:  More than three times a week    Frequency of Social Gatherings with Friends and Family: More than three times a week    Attends Religious Services: Never    Marine scientist or Organizations: No    Attends Music therapist: Never    Marital Status: Married   Family History  Problem Relation Age of Onset   Diabetes Mother    Hypertension Mother    Hyperlipidemia Mother    Stroke Mother    Heart failure Mother    Diabetes Father    Heart disease Father    Hypertension Father    Hyperlipidemia Father    Heart attack Father    Diabetes Brother    Diabetes Brother    Diabetes Son    Scheduled Meds:  amLODipine  5 mg Oral Daily   insulin aspart  0-9 Units Subcutaneous Q6H   Continuous Infusions:  levETIRAcetam Stopped (03/21/22 1121)   PRN Meds:.acetaminophen **OR** acetaminophen, hydrALAZINE, LORazepam, ondansetron **OR** ondansetron (ZOFRAN) IV Medications Prior to Admission:  Prior to Admission medications   Medication Sig Start Date End Date Taking? Authorizing Provider  benazepril (LOTENSIN) 20 MG tablet Take 1 tablet (20 mg total) by mouth daily. 02/16/22  Yes Fayrene Helper, MD  CALCIUM PO Take 1 tablet by mouth 3 (three) times a week.   Yes [provider]  Cholecalciferol (VITAMIN D3 PO) Take 1 tablet by mouth 3 (three) times a week.   Yes [provider]  insulin degludec (TRESIBA FLEXTOUCH) 100 UNIT/ML FlexTouch Pen Inject 5  Units into the skin at bedtime. 01/26/22  Yes Brita Romp, NP  linagliptin (TRADJENTA) 5 MG TABS tablet Take 1 tablet (5 mg total) by mouth daily with breakfast. 01/26/22  Yes Brita Romp, NP  rosuvastatin (CRESTOR) 5 MG tablet Take one tablet by mouth every Monday , Wednesday and Friday 12/07/20  Yes Fayrene Helper, MD  diphenoxylate-atropine (LOMOTIL) 2.5-0.025 MG tablet TAKE ONE TABLET BY MOUTH THREE TIMES WEEKLY AS NEEDED, FOR LOOSE STOOL Patient not taking: Reported on 03/20/2022 03/16/22   Fayrene Helper, MD  donepezil (ARICEPT) 10 MG tablet Take 1 tablet (10 mg total) by mouth at bedtime. Patient not taking: Reported on 03/20/2022 03/29/21   Fayrene Helper, MD  dorzolamide-timolol (COSOPT) 22.3-6.8 MG/ML ophthalmic solution Place 1 drop into both eyes 3 (three) times a week. Patient not taking: Reported on 03/20/2022 07/07/20   [provider]  Insulin Pen Needle (PEN NEEDLES) 31G X 6 MM MISC 1 each by Does not apply route at bedtime. 11/18/21   Brita Romp, NP  Iron, Ferrous Sulfate, 325 (65 Fe) MG TABS Take one tablet by mouth once daily Patient not taking: Reported on 03/20/2022 12/17/20   Fayrene Helper, MD  latanoprost (XALATAN) 0.005 % ophthalmic solution Place 1 drop into both eyes 3 (three) times a week. At bedtime Patient not taking: Reported on 03/20/2022 01/10/19   [provider]  pantoprazole (PROTONIX) 40 MG tablet TAKE 1 TABLET BY MOUTH EVERY DAY Patient not taking: Reported on 03/13/2022 06/27/21   Fayrene Helper, MD   Allergies  Allergen Reactions   Alpha-Gal    Review of Systems  Unable to perform ROS: Mental status change    Physical Exam Cardiovascular:     Rate and Rhythm: Normal rate.  Pulmonary:     Effort: Pulmonary effort is normal.  Skin:    General: Skin is warm and dry.  Neurological:  Mental Status: He is alert. He is disoriented.     Vital Signs: BP 123/66 (BP Location: Left Arm)    Pulse 76   Temp 97.6 F (36.4 C)   Resp 16   Ht '5\' 4"'$  (1.626 m)   Wt 52.2 kg   SpO2 100%   BMI 19.74 kg/m  Pain Scale: 0-10   Pain Score: Asleep   SpO2: SpO2: 100 % O2 Device:SpO2: 100 % O2 Flow Rate: .O2 Flow Rate (L/min): 2 L/min  IO: Intake/output summary:  Intake/Output Summary (Last 24 hours) at 03/21/2022 1357 Last data filed at 03/21/2022 0446 Gross per 24 hour  Intake 94.66 ml  Output 1400 ml  Net -1305.34 ml    LBM: Last BM Date : 03/19/22 Baseline Weight: Weight: 52.2 kg Most recent weight: Weight: 52.2 kg     Palliative Assessment/Data:  40 % at best     Time In: 1300 Time Out: 1410  Time Total: 70 minutes Greater than 50%  of this time was spent counseling and coordinating care related to the above assessment and plan.  Signed by: Wadie Lessen, NP   Please contact Palliative Medicine Team phone at [phone] for questions and concerns.  For individual provider: See Shea Evans

## 2022-03-21 NOTE — Telephone Encounter (Signed)
Left a message requesting pt's sister to return call to the office.

## 2022-03-21 NOTE — Evaluation (Signed)
Physical Therapy Evaluation Patient Details Name: Jacob Barnes MRN: [id number] DOB: 10-14-1940 Today's Date: 03/21/2022  History of Present Illness  Pt is 81 yo male admitted 03/19/22 with subdural hematoma.  Neurosurgery consulted and pt for possible MMA embolization.  Pt with hx including but not limited to mild dementia, DM2, HTN, CKD, DM, and arthritis  Clinical Impression  Pt admitted with above diagnosis. At baseline, pt resides with spouse and is ambulatory with cane.  Reports some unsteadiness at baseline but typically is able to recover.  Spoke with wife on phone who provided history.  Today, pt was able to ambulate 100' but required min-mod A at times for balance.  He required mod A with initial standing to stabilize.  Pt's strength throughout was equal and strong.  Pt has potential to progress to home level but due to instability at this time do recommend SNF. Additionally will need further assessment if further procedures performed.  Pt currently with functional limitations due to the deficits listed below (see PT Problem List). Pt will benefit from skilled PT to increase their independence and safety with mobility to allow discharge to the venue listed below.          Recommendations for follow up therapy are one component of a multi-disciplinary discharge planning process, led by the attending physician.  Recommendations may be updated based on patient status, additional functional criteria and insurance authorization.  Follow Up Recommendations Skilled nursing-short term rehab (<3 hours/day) (potential to progress to HHPT) Can patient physically be transported by private vehicle: Yes    Assistance Recommended at Discharge Frequent or constant Supervision/Assistance  Patient can return home with the following  A lot of help with bathing/dressing/bathroom;A lot of help with walking and/or transfers;Assistance with cooking/housework;Help with stairs or ramp for entrance    Equipment  Recommendations None recommended by PT  Recommendations for Other Services       Functional Status Assessment Patient has had a recent decline in their functional status and demonstrates the ability to make significant improvements in function in a reasonable and predictable amount of time.     Precautions / Restrictions Precautions Precautions: Fall;Other (comment) Precaution Comments: systolic BP <678      Mobility  Bed Mobility Overal bed mobility: Needs Assistance Bed Mobility: Supine to Sit, Sit to Supine     Supine to sit: Supervision Sit to supine: Supervision   General bed mobility comments: No assist needed but heavily relied on momentum to sit (flexing legs up then throwing them down to sit)    Transfers Overall transfer level: Needs assistance Equipment used: Rolling walker (2 wheels) Transfers: Sit to/from Stand Sit to Stand: Mod assist           General transfer comment: Pt with strength to power up but needing mod A to stabilize with tendency to posterior lean    Ambulation/Gait Ambulation/Gait assistance: Mod assist, Min assist Gait Distance (Feet): 100 Feet Assistive device: Rolling walker (2 wheels) Gait Pattern/deviations: Step-through pattern, Decreased stride length, Narrow base of support, Scissoring Gait velocity: decreased     General Gait Details: Pt with narrow BOS with 1-2 scissoring episodes. He demonstrated instability  but did improve throughout walk.  Pt overall needing min A but occasional mod A for LOB  Stairs            Wheelchair Mobility    Modified Rankin (Stroke Patients Only)       Balance Overall balance assessment: Needs assistance Sitting-balance support: No upper extremity  supported Sitting balance-Leahy Scale: Good     Standing balance support: Bilateral upper extremity supported Standing balance-Leahy Scale: Poor Standing balance comment: Pt with posterior lean with standing requiring min-mod A to  stabilize                             Pertinent Vitals/Pain Pain Assessment Pain Assessment: No/denies pain    Home Living Family/patient expects to be discharged to:: Private residence Living Arrangements: Spouse/significant other Available Help at Discharge: Family;Available 24 hours/day Type of Home: House Home Access: Stairs to enter Entrance Stairs-Rails: Right;Left;Can reach both Entrance Stairs-Number of Steps: 4   Home Layout: One level Home Equipment: Shower seat;Cane - single point;Rolling Environmental consultant (2 wheels) Additional Comments: spouse provided history    Prior Function Prior Level of Function : Independent/Modified Independent             Mobility Comments: Pt ambulated with cane ; wife reports unsteady at times - states he normally catches himself but has fallen (had severe fall off step stool in Jan 2023) ADLs Comments: Pt's wife assist some with ADLs     Hand Dominance        Extremity/Trunk Assessment   Upper Extremity Assessment Upper Extremity Assessment: Overall WFL for tasks assessed (ROM WFL; MMT 5/5)    Lower Extremity Assessment Lower Extremity Assessment: LLE deficits/detail;RLE deficits/detail RLE Deficits / Details: ROM WFL; MMT 5/5 RLE Sensation: WNL RLE Coordination: WNL LLE Deficits / Details: ROM WFL; MMT 5/5 LLE Sensation: WNL LLE Coordination: WNL    Cervical / Trunk Assessment Cervical / Trunk Assessment: Normal  Communication   Communication: HOH  Cognition Arousal/Alertness: Awake/alert Behavior During Therapy: WFL for tasks assessed/performed Overall Cognitive Status: No family/caregiver present to determine baseline cognitive functioning                                 General Comments: Hx of dementia; followed basic commands; Oriented to self, location, month/year with increased time        General Comments General comments (skin integrity, edema, etc.): VSS    Exercises      Assessment/Plan    PT Assessment Patient needs continued PT services  PT Problem List Decreased strength;Decreased coordination;Decreased cognition;Decreased activity tolerance;Decreased knowledge of use of DME;Decreased balance;Decreased safety awareness;Decreased mobility;Decreased knowledge of precautions       PT Treatment Interventions DME instruction;Therapeutic exercise;Gait training;Balance training;Stair training;Functional mobility training;Therapeutic activities;Patient/family education;Neuromuscular re-education;Cognitive remediation    PT Goals (Current goals can be found in the Care Plan section)  Acute Rehab PT Goals Patient Stated Goal: return home if able; spoke with spouse who is agreeable to rehab if needed but prefers somewhere in [place] PT Goal Formulation: With patient/family Time For Goal Achievement: 04/04/22 Potential to Achieve Goals: Good    Frequency Min 3X/week     Co-evaluation               AM-PAC PT "6 Clicks" Mobility  Outcome Measure Help needed turning from your back to your side while in a flat bed without using bedrails?: A Little Help needed moving from lying on your back to sitting on the side of a flat bed without using bedrails?: A Little Help needed moving to and from a bed to a chair (including a wheelchair)?: A Lot Help needed standing up from a chair using your arms (e.g., wheelchair or bedside chair)?: A Lot  Help needed to walk in hospital room?: A Lot Help needed climbing 3-5 steps with a railing? : A Lot 6 Click Score: 14    End of Session Equipment Utilized During Treatment: Gait belt Activity Tolerance: Patient tolerated treatment well Patient left: in bed;with call bell/phone within reach;with bed alarm set Nurse Communication: Mobility status PT Visit Diagnosis: Unsteadiness on feet (R26.81);History of falling (Z91.81)    Time: 2929-0903 PT Time Calculation (min) (ACUTE ONLY): 28 min   Charges:   PT  Evaluation $PT Eval Low Complexity: 1 Low PT Treatments $Gait Training: 8-22 mins        Abran Richard, PT Acute Rehab [facility] [place] 03/21/2022, 4:42 PM

## 2022-03-22 ENCOUNTER — Encounter (HOSPITAL_COMMUNITY): Payer: Self-pay | Admitting: Internal Medicine

## 2022-03-22 ENCOUNTER — Inpatient Hospital Stay (HOSPITAL_COMMUNITY): Payer: Medicare HMO

## 2022-03-22 ENCOUNTER — Other Ambulatory Visit: Payer: Self-pay

## 2022-03-22 ENCOUNTER — Inpatient Hospital Stay (HOSPITAL_COMMUNITY): Payer: Medicare HMO | Admitting: Critical Care Medicine

## 2022-03-22 ENCOUNTER — Other Ambulatory Visit (HOSPITAL_COMMUNITY): Payer: Self-pay | Admitting: *Deleted

## 2022-03-22 ENCOUNTER — Encounter (HOSPITAL_COMMUNITY)
Admission: EM | Disposition: A | Discharge status: Skilled Nursing Facility | Payer: Self-pay | Source: Home / Self Care | Attending: Internal Medicine

## 2022-03-22 DIAGNOSIS — Z515 Encounter for palliative care: Secondary | ICD-10-CM

## 2022-03-22 DIAGNOSIS — S065XAA Traumatic subdural hemorrhage with loss of consciousness status unknown, initial encounter: Secondary | ICD-10-CM

## 2022-03-22 DIAGNOSIS — I10 Essential (primary) hypertension: Secondary | ICD-10-CM

## 2022-03-22 DIAGNOSIS — Z794 Long term (current) use of insulin: Secondary | ICD-10-CM | POA: Diagnosis not present

## 2022-03-22 DIAGNOSIS — Z7189 Other specified counseling: Secondary | ICD-10-CM

## 2022-03-22 DIAGNOSIS — E119 Type 2 diabetes mellitus without complications: Secondary | ICD-10-CM | POA: Diagnosis not present

## 2022-03-22 DIAGNOSIS — Z7984 Long term (current) use of oral hypoglycemic drugs: Secondary | ICD-10-CM

## 2022-03-22 HISTORY — PX: IR ANGIO INTRA EXTRACRAN SEL INTERNAL CAROTID BILAT MOD SED: IMG5363

## 2022-03-22 HISTORY — PX: RADIOLOGY WITH ANESTHESIA: SHX6223

## 2022-03-22 HISTORY — PX: IR ANGIO EXTERNAL CAROTID SEL EXT CAROTID UNI L MOD SED: IMG5370

## 2022-03-22 LAB — GLUCOSE, CAPILLARY
Glucose-Capillary: 111 mg/dL — ABNORMAL HIGH (ref 70–99)
Glucose-Capillary: 130 mg/dL — ABNORMAL HIGH (ref 70–99)
Glucose-Capillary: 121 mg/dL — ABNORMAL HIGH (ref 70–99)
Glucose-Capillary: 133 mg/dL — ABNORMAL HIGH (ref 70–99)

## 2022-03-22 LAB — SURGICAL PCR SCREEN
MRSA, PCR: NEGATIVE
Staphylococcus aureus: NEGATIVE

## 2022-03-22 LAB — TYPE AND SCREEN
ABO/RH(D): O POS
Antibody Screen: NEGATIVE

## 2022-03-22 SURGERY — IR WITH ANESTHESIA
Anesthesia: General | Laterality: Left

## 2022-03-22 MED ORDER — PHENYLEPHRINE HCL-NACL 20-0.9 MG/250ML-% IV SOLN
INTRAVENOUS | Status: DC | PRN
  Administered 2022-03-22: 25 ug/min via INTRAVENOUS

## 2022-03-22 MED ORDER — ORAL CARE MOUTH RINSE: 15.0000 mL | Freq: Once | OROMUCOSAL | Status: AC

## 2022-03-22 MED ORDER — ONDANSETRON HCL 4 MG/2ML IJ SOLN
INTRAMUSCULAR | Status: DC | PRN
  Administered 2022-03-22: 4 mg via INTRAVENOUS

## 2022-03-22 MED ORDER — ACETAMINOPHEN 10 MG/ML IV SOLN: 1000.0000 mg | Freq: Once | INTRAVENOUS | Status: DC | PRN

## 2022-03-22 MED ORDER — SODIUM CHLORIDE 0.9 % IV SOLN: INTRAVENOUS | Status: DC

## 2022-03-22 MED ORDER — PROPOFOL 10 MG/ML IV BOLUS
INTRAVENOUS | Status: DC | PRN
  Administered 2022-03-22: 20 mg via INTRAVENOUS
  Administered 2022-03-22: 100 mg via INTRAVENOUS
  Administered 2022-03-22: 30 mg via INTRAVENOUS

## 2022-03-22 MED ORDER — CHLORHEXIDINE GLUCONATE 0.12 % MT SOLN: 15.0000 mL | Freq: Once | OROMUCOSAL | Status: AC

## 2022-03-22 MED ORDER — ROCURONIUM BROMIDE 10 MG/ML (PF) SYRINGE
PREFILLED_SYRINGE | INTRAVENOUS | Status: DC | PRN
  Administered 2022-03-22 (×2): 50 mg via INTRAVENOUS

## 2022-03-22 MED ORDER — PHENYLEPHRINE 80 MCG/ML (10ML) SYRINGE FOR IV PUSH (FOR BLOOD PRESSURE SUPPORT)
PREFILLED_SYRINGE | INTRAVENOUS | Status: DC | PRN
  Administered 2022-03-22: 160 ug via INTRAVENOUS

## 2022-03-22 MED ORDER — FENTANYL CITRATE (PF) 100 MCG/2ML IJ SOLN
INTRAMUSCULAR | Status: AC
  Filled 2022-03-22: qty 2

## 2022-03-22 MED ORDER — SODIUM CHLORIDE 0.9 % IV SOLN: INTRAVENOUS | Status: DC | PRN

## 2022-03-22 MED ORDER — LIDOCAINE 2% (20 MG/ML) 5 ML SYRINGE
INTRAMUSCULAR | Status: DC | PRN
  Administered 2022-03-22: 20 mg via INTRAVENOUS

## 2022-03-22 MED ORDER — INSULIN ASPART 100 UNIT/ML IJ SOLN: 0.0000 [IU] | INTRAMUSCULAR | Status: DC | PRN

## 2022-03-22 MED ORDER — DEXAMETHASONE SODIUM PHOSPHATE 10 MG/ML IJ SOLN
INTRAMUSCULAR | Status: DC | PRN
  Administered 2022-03-22: 10 mg via INTRAVENOUS
  Administered 2022-03-22: 4 mg via INTRAVENOUS

## 2022-03-22 MED ORDER — SUGAMMADEX SODIUM 200 MG/2ML IV SOLN
INTRAVENOUS | Status: DC | PRN
  Administered 2022-03-22: 100 mg via INTRAVENOUS

## 2022-03-22 MED ORDER — FENTANYL CITRATE (PF) 100 MCG/2ML IJ SOLN: 25.0000 ug | INTRAMUSCULAR | Status: DC | PRN

## 2022-03-22 MED ORDER — CHLORHEXIDINE GLUCONATE 0.12 % MT SOLN
OROMUCOSAL | Status: AC
  Administered 2022-03-22: 15 mL via OROMUCOSAL
  Filled 2022-03-22: qty 15

## 2022-03-22 MED ORDER — FENTANYL CITRATE (PF) 250 MCG/5ML IJ SOLN
INTRAMUSCULAR | Status: DC | PRN
  Administered 2022-03-22: 25 ug via INTRAVENOUS

## 2022-03-22 NOTE — Care Management Important Message (Signed)
Important Message  Patient Details  Name: Jacob Barnes MRN: [id number] Date of Birth: 09-18-1940   Medicare Important Message Given:  Yes     Orbie Pyo 03/22/2022, 2:32 PM

## 2022-03-22 NOTE — Progress Notes (Signed)
OT Cancellation Note  Patient Details Name: Jacob Barnes MRN: [id number] DOB: 1940-12-21   Cancelled Treatment:    Reason Eval/Treat Not Completed: Patient at procedure or test/ unavailable. Will return as schedule allows.   Elder Cyphers, OTR/L [facility] Acute Rehabilitation Office: [phone]   Magnus Ivan 03/22/2022, 2:09 PM

## 2022-03-22 NOTE — Progress Notes (Signed)
PROGRESS NOTE  Jacob Barnes FHL:[id number] DOB: 03-26-1941 DOA: 03/19/2022 PCP: Fayrene Helper, MD   LOS: 3 days   Brief Narrative / Interim history: 81 year old male with history of mild dementia, DM2, HTN, CKD 3A with baseline creatinine 1.1-1.3, comes into the hospital with increased confusion, poor coordination per family.  He initially presented to [facility] ED, found to have a subdural hematoma, and then transferred to [facility] for neurosurgery evaluation  Subjective / 24h Interval events: No acute issues or events overnight denies nausea vomiting diarrhea constipation fevers chills or chest pain.  Patient does report bilateral frontal headache improved with Tylenol.  Assesement and Plan: Principal Problem:   Subdural hematoma (HCC) Active Problems:   Acute hypoxic respiratory failure (HCC)   Essential hypertension, benign   CKD (chronic kidney disease) stage 3, GFR 30-59 ml/min (HCC)   Uncontrolled type 2 diabetes mellitus with hyperglycemia (HCC)   Dementia (HCC)  Subdural hematoma  -Without reported trauma -CT scan on admission showed left cerebral subdural hematoma with mass effect 4 - 5 millimeters rightward midline shift.  Repeat CT scan shows stability.   -Neurosurgery following, current plan for MMA embolization per their documentation -Keep blood pressure less than 160, initially required Cardene drip but now off.  Start amlodipine  Acute metabolic encephalopathy-improving likely due to above.  Urinalysis not suggestive of UTI, chest x-ray without evidence of significant infection.  Hypoxemia-resolved, transient episode - now on room air  Mild dementia-apparently was quite functional prior to this presentation, able to take care of himself, performing all his ADLs, driving independently  DM2-uncontrolled, with hyperglycemia.  Placed on sliding scale, hypoglycemic protocol  CBG (last 3)   Acute kidney injury, resolved Patient carries diagnosis of CKD 3A in the  chart however his GFR is >60 which appears to be his baseline.  Would have patient follow-up with PCP and possibly nephrology for further discussion on this diagnosis  Hypokalemia-replete this morning  Essential hypertension-blood pressure controlled, start amlodipine  Chronic anemia-normocytic, hemoglobin stable, no bleeding  Scheduled Meds:  amLODipine  5 mg Oral Daily   insulin aspart  0-9 Units Subcutaneous Q6H   Continuous Infusions:  levETIRAcetam 500 mg (03/22/22 0237)   PRN Meds:.acetaminophen **OR** acetaminophen, hydrALAZINE, LORazepam, ondansetron **OR** ondansetron (ZOFRAN) IV  Current Outpatient Medications  Medication Instructions   benazepril (LOTENSIN) 20 mg, Oral, Daily   CALCIUM PO 1 tablet, Oral, 3 times weekly   Cholecalciferol (VITAMIN D3 PO) 1 tablet, Oral, 3 times weekly   diphenoxylate-atropine (LOMOTIL) 2.5-0.025 MG tablet TAKE ONE TABLET BY MOUTH THREE TIMES WEEKLY AS NEEDED, FOR LOOSE STOOL   donepezil (ARICEPT) 10 mg, Oral, Daily at bedtime   dorzolamide-timolol (COSOPT) 22.3-6.8 MG/ML ophthalmic solution 1 drop, 3 times weekly   Insulin Pen Needle (PEN NEEDLES) 31G X 6 MM MISC 1 each, Does not apply, Daily at bedtime   Iron, Ferrous Sulfate, 325 (65 Fe) MG TABS Take one tablet by mouth once daily   latanoprost (XALATAN) 0.005 % ophthalmic solution 1 drop, 3 times weekly   linagliptin (TRADJENTA) 5 mg, Oral, Daily with breakfast   pantoprazole (PROTONIX) 40 MG tablet TAKE 1 TABLET BY MOUTH EVERY DAY   rosuvastatin (CRESTOR) 5 MG tablet Take one tablet by mouth every Monday , Wednesday and Friday   Tresiba FlexTouch 5 Units, Subcutaneous, Daily at bedtime    Diet Orders (From admission, onward)     Start     Ordered   03/22/22 0455  Diet NPO time specified Except for:  Sips with Meds  Diet effective midnight       Question:  Except for  Answer:  Sips with Meds   03/22/22 0454            DVT prophylaxis: SCDs Start: 03/19/22 2116   Lab  Results  Component Value Date   PLT 97 (L) 03/21/2022      Code Status: Full Code  Family Communication: Sister was present at bedside Status is: Inpatient Remains inpatient appropriate because: severity of illness  Level of care: Progressive  Consultants:  Neurosurgery   Objective: Vitals:   03/21/22 1725 03/21/22 2025 03/21/22 2357 03/22/22 0334  BP: 136/71 (!) 143/74 111/61 109/60  Pulse: 69 79 79 71  Resp: '16 16 16 17  '$ Temp: (!) 96.8 F (36 C) 98.7 F (37.1 C) 98.6 F (37 C) 98.5 F (36.9 C)  TempSrc:  Oral Oral Oral  SpO2: 100% 98% 99% 98%  Weight:      Height:        Intake/Output Summary (Last 24 hours) at 03/22/2022 0720 Last data filed at 03/21/2022 1800 Gross per 24 hour  Intake 1080 ml  Output --  Net 1080 ml    Wt Readings from Last 3 Encounters:  03/19/22 52.2 kg  03/13/22 52.1 kg  02/16/22 51.3 kg    Examination:  General:  Pleasantly resting in bed, No acute distress. HEENT:  Normocephalic atraumatic.  Sclerae nonicteric, noninjected.  Extraocular movements intact bilaterally. Neck:  Without mass or deformity.  Trachea is midline. Lungs:  Clear to auscultate bilaterally without rhonchi, wheeze, or rales. Heart:  Regular rate and rhythm.  Without murmurs, rubs, or gallops. Abdomen:  Soft, nontender, nondistended.  Without guarding or rebound. Extremities: Without cyanosis, clubbing, edema, or obvious deformity. Vascular:  Dorsalis pedis and posterior tibial pulses palpable bilaterally.  Data Reviewed: I have independently reviewed following labs and imaging studies   CBC Recent Labs  Lab 03/19/22 1615 03/19/22 1652 03/20/22 0532 03/21/22 0243  WBC 7.1  --  9.8 7.2  HGB 9.9* 10.5* 10.3* 10.1*  HCT 30.9* 31.0* 31.9* 31.1*  PLT 112*  --  106* 97*  MCV 93.4  --  93.3 91.7  MCH 29.9  --  30.1 29.8  MCHC 32.0  --  32.3 32.5  RDW 16.1*  --  16.1* 15.9*  LYMPHSABS 1.8  --   --   --   MONOABS 1.6*  --   --   --   EOSABS 0.0  --   --    --   BASOSABS 0.0  --   --   --      Recent Labs  Lab 03/19/22 1615 03/19/22 1652 03/20/22 0532 03/21/22 0243  NA 136 140 138 138  K 3.6 3.7 3.4* 3.4*  CL 100 98 108 104  CO2 28  --  28 27  GLUCOSE 375* 378* 122* 125*  BUN '18 16 13 9  '$ CREATININE 1.52* 1.60* 1.14 1.03  CALCIUM 8.3*  --  8.0* 8.1*  AST 25  --   --   --   ALT 16  --   --   --   ALKPHOS 101  --   --   --   BILITOT 0.4  --   --   --   ALBUMIN 3.5  --   --   --   INR 1.1  --   --   --      ------------------------------------------------------------------------------------------------------------------ No results for input(s): "CHOL", "HDL", "LDLCALC", "  TRIG", "CHOLHDL", "LDLDIRECT" in the last 72 hours.  Lab Results  Component Value Date   HGBA1C 9.1 (A) 01/26/2022   ------------------------------------------------------------------------------------------------------------------ No results for input(s): "TSH", "T4TOTAL", "T3FREE", "THYROIDAB" in the last 72 hours.  Invalid input(s): "FREET3"  Cardiac Enzymes No results for input(s): "CKMB", "TROPONINI", "MYOGLOBIN" in the last 168 hours.  Invalid input(s): "CK" ------------------------------------------------------------------------------------------------------------------ No results found for: "BNP"  CBG: Recent Labs  Lab 03/21/22 1300 03/21/22 1726 03/21/22 2232 03/22/22 0242 03/22/22 0647  GLUCAP 195* 208* 381* 111* 130*     Recent Results (from the past 240 hour(s))  Surgical pcr screen     Status: None   Collection Time: 03/22/22  3:31 AM   Specimen: Nasal Mucosa; Nasal Swab  Result Value Ref Range Status   MRSA, PCR NEGATIVE NEGATIVE Final   Staphylococcus aureus NEGATIVE NEGATIVE Final    Comment: (NOTE) The Xpert SA Assay (FDA approved for NASAL specimens in patients 64 years of age and older), is one component of a comprehensive surveillance program. It is not intended to diagnose infection nor to guide or monitor  treatment. Performed at [facility], [place] [address], [place], [place] [address]      Radiology Studies: No results found.   Holli Humbles DO [facility]  Between 7 am - 7 pm I am available, please contact me via Amion (for emergencies) or Securechat (non urgent messages)  Between 7 pm - 7 am I am not available, please contact night coverage on amion

## 2022-03-22 NOTE — Telephone Encounter (Signed)
Have you received anything from [employer] requesting notes from most recent visit?

## 2022-03-22 NOTE — Progress Notes (Signed)
  NEUROSURGERY PROGRESS NOTE   No issues overnight.   EXAM:  BP 110/63 (BP Location: Left Arm)   Pulse 68   Temp 98.6 F (37 C) (Axillary)   Resp 16   Ht '5\' 4"'$  (1.626 m)   Wt 52.2 kg   SpO2 99%   BMI 19.74 kg/m   Awake, alert, remains oriented x2 Speech somewhat dysarthric  CN grossly intact  MAE good strength  IMPRESSION:  81 y.o. male with acute on chronic left convexity SDH, appears minimally symptomatic from neurologic standpoint  PLAN: - Will plan on proceeding with left MMA embo today  Updated both wife and sister regarding the plan above. All questions were answered.   Consuella Lose, MD [facility]

## 2022-03-22 NOTE — Brief Op Note (Signed)
  NEUROSURGERY BRIEF OPERATIVE  NOTE   PREOP DX: Left subdural hematoma  POSTOP DX: Same  PROCEDURE: Onyx embolization of left middle meningeal artery  SURGEON: Dr. Consuella Lose, MD  ANESTHESIA: GETA  APPROACH: Right trans-femoral  EBL: Minimal  SPECIMENS: None  COMPLICATIONS: None  CONDITION: Stable to recovery  FINDINGS (Full report in [facility]): 1. Successful Onyx embolization of left middle meningeal artery    Consuella Lose, MD [facility]

## 2022-03-22 NOTE — Progress Notes (Signed)
Patient ID: [name], male   DOB: [birth year], 81 y.o.   MRN: [id number]    Progress Note from the Palliative Medicine Team at [facility]   Patient Name: Jacob Barnes        Date: 03/22/2022 DOB: [birth year]  Age: 81 y.o. MRN#: [id number] Attending Physician: Little Ishikawa, MD Primary Care Physician: Fayrene Helper, MD Admit Date: 03/19/2022   Medical records reviewed, assessed patient and discussed with treatment team  81 year old male with history of mild dementia, DM2, HTN, CKD 3A with baseline creatinine 1.1-1.3, comes into the hospital with increased confusion, poor coordination per family. He initially presented to [facility] ED, found to have a subdural hematoma, and then transferred to [facility] for neurosurgery evaluation.  He lives at home with his wife.    This NP assessed patient at the bedside as a follow up to  yesterday's [place], and to meet with patient's wife as scheduled for continued conversation regarding current medical situation.   Wife and patient's sister at bedside.   Education offered on current medical situation, patient awaiting surgery for embolization of left middle meningeal artery.  Patient is pleasantly confused.  All family recognize patient's slow, physical functional and cognitive decline over the past many months.  All recognize the increasing care needs in the home.  Again conversation was had regarding importance of conversation and documentation of advance directives.  Concepts specific to CODE STATU artificial feeding and hydration, continued IV antibiotics and rehospitalization was had.       MOST form introduced   Plan of care -Full code       -Educated patient/family to consider DNR/DNI status understanding evidenced based poor outcomes in similar hospitalized patient, as the cause of arrest is likely associated with advanced chronic illness rather than an easily reversible acute cardio-pulmonary event. -Treat the treatable hope for  improvement -SNF for short-term rehab -Ultimately return home previous living arrangement    Education offered today regarding  the importance of continued conversation with family and their  medical providers regarding overall plan of care and treatment options,  ensuring decisions are within the context of the patients values and GOCs.  Questions and concerns addressed   Discussed with Dr Avon Gully  50 minutes  Wadie Lessen NP  Palliative Medicine Team Team Phone # [phone] Pager [phone]

## 2022-03-22 NOTE — Anesthesia Postprocedure Evaluation (Signed)
Anesthesia Post Note  Patient: Jacob Barnes  Procedure(s) Performed: LEFT MMA EMBOLIZATION (Left)     Patient location during evaluation: PACU Anesthesia Type: General Level of consciousness: awake Pain management: pain level controlled Vital Signs Assessment: post-procedure vital signs reviewed and stable Respiratory status: spontaneous breathing Cardiovascular status: stable Postop Assessment: no apparent nausea or vomiting Anesthetic complications: no   No notable events documented.  Last Vitals:  Vitals:   03/22/22 1401 03/22/22 1405  BP: 127/71 121/71  Pulse: 64 65  Resp: 17 18  Temp: 36.8 C   SpO2: 98% 99%    Last Pain:  Vitals:   03/22/22 1405  TempSrc:   PainSc: 0-No pain                 Raziyah Vanvleck

## 2022-03-22 NOTE — Anesthesia Procedure Notes (Signed)
Procedure Name: Intubation Date/Time: 03/22/2022 4:06 PM  Performed by: Inda Coke, CRNAPre-anesthesia Checklist: Patient identified, Emergency Drugs available, Suction available, Timeout performed and Patient being monitored Patient Re-evaluated:Patient Re-evaluated prior to induction Oxygen Delivery Method: Circle system utilized Preoxygenation: Pre-oxygenation with 100% oxygen Induction Type: IV induction Ventilation: Mask ventilation without difficulty Laryngoscope Size: Mac and 4 Grade View: Grade I Tube type: Oral Tube size: 8.0 mm Number of attempts: 1 Airway Equipment and Method: Stylet Placement Confirmation: ETT inserted through vocal cords under direct vision, positive ETCO2, CO2 detector and breath sounds checked- equal and bilateral Secured at: 22 cm Tube secured with: Tape Dental Injury: Teeth and Oropharynx as per pre-operative assessment

## 2022-03-22 NOTE — Anesthesia Preprocedure Evaluation (Addendum)
Anesthesia Evaluation  Patient identified by MRN, date of birth, ID band Patient awake    Reviewed: Allergy & Precautions, NPO status , Patient's Chart, lab work & pertinent test results  Airway Mallampati: I  TM Distance: >3 FB Neck ROM: Full    Dental  (+) Upper Dentures, Dental Advisory Given   Pulmonary neg pulmonary ROS    + decreased breath sounds      Cardiovascular hypertension, Pt. on medications  Rhythm:Regular Rate:Normal     Neuro/Psych  PSYCHIATRIC DISORDERS         GI/Hepatic Neg liver ROS,GERD  Medicated,,  Endo/Other  diabetes, Type 2, Insulin Dependent, Oral Hypoglycemic Agents    Renal/GU Renal disease     Musculoskeletal  (+) Arthritis ,    Abdominal   Peds  Hematology negative hematology ROS (+)   Anesthesia Other Findings   Reproductive/Obstetrics                             Anesthesia Physical Anesthesia Plan  ASA: 3  Anesthesia Plan: General   Post-op Pain Management:    Induction: Intravenous  PONV Risk Score and Plan: 3 and Ondansetron and Treatment may vary due to age or medical condition  Airway Management Planned: Oral ETT  Additional Equipment: Arterial line  Intra-op Plan:   Post-operative Plan: Extubation in OR  Informed Consent: I have reviewed the patients History and Physical, chart, labs and discussed the procedure including the risks, benefits and alternatives for the proposed anesthesia with the patient or authorized representative who has indicated his/her understanding and acceptance.     Dental advisory given  Plan Discussed with: CRNA  Anesthesia Plan Comments: (Attempted to contact family, no answer. )        Anesthesia Quick Evaluation

## 2022-03-22 NOTE — Transfer of Care (Signed)
Immediate Anesthesia Transfer of Care Note  Patient: Jacob Barnes  Procedure(s) Performed: LEFT MMA EMBOLIZATION (Left)  Patient Location: PACU  Anesthesia Type:General  Level of Consciousness: awake  Airway & Oxygen Therapy: Patient Spontanous Breathing and Patient connected to face mask oxygen  Post-op Assessment: Report given to RN  Post vital signs: Reviewed and stable  Last Vitals:  Vitals Value Taken Time  BP 95/70 03/22/22 1812  Temp    Pulse 70 03/22/22 1815  Resp 11 03/22/22 1815  SpO2 100 % 03/22/22 1815  Vitals shown include unvalidated device data.  Last Pain:  Vitals:   03/22/22 1405  TempSrc:   PainSc: 0-No pain         Complications: No notable events documented.

## 2022-03-22 NOTE — Telephone Encounter (Signed)
F/u   Robertson,Devern sister returning call back to the nurse from yesterday.

## 2022-03-22 NOTE — Anesthesia Procedure Notes (Signed)
Arterial Line Insertion Start/End12/13/2023 3:15 PM, 03/22/2022 3:30 PM Performed by: Inda Coke, CRNA, CRNA  Patient location: Pre-op. Preanesthetic checklist: patient identified, IV checked, site marked, risks and benefits discussed, surgical consent, monitors and equipment checked, pre-op evaluation, timeout performed and anesthesia consent Right, radial was placed Catheter size: 20 G Hand hygiene performed  and maximum sterile barriers used  Allen's test indicative of satisfactory collateral circulation Attempts: 1 Procedure performed without using ultrasound guided technique. Following insertion, dressing applied and Biopatch. Post procedure assessment: normal  Patient tolerated the procedure well with no immediate complications.

## 2022-03-22 NOTE — Progress Notes (Signed)
I have spoken to the patient's wife over the phone [phone]) regarding the planned MMA embolization today. We reviewed the situation inculding imaging thus far and the treatment options including open surgical evacuation, observation, and endovascular MMA embolization. With his current neurologic condition being quite good, I did recommend MMA embolization. We discussed the details of the procedure including postop course. We discussed risks including stroke, dissection, and groin hematoma. We also reviewed possible outcomes including need for open surgery at a later time. All her questions were answered and she provided verbal consent to proceed.  Consuella Lose, MD [facility]

## 2022-03-23 ENCOUNTER — Encounter (HOSPITAL_COMMUNITY): Payer: Self-pay | Admitting: Neurosurgery

## 2022-03-23 LAB — GLUCOSE, CAPILLARY
Glucose-Capillary: 187 mg/dL — ABNORMAL HIGH (ref 70–99)
Glucose-Capillary: 175 mg/dL — ABNORMAL HIGH (ref 70–99)
Glucose-Capillary: 125 mg/dL — ABNORMAL HIGH (ref 70–99)
Glucose-Capillary: 288 mg/dL — ABNORMAL HIGH (ref 70–99)
Glucose-Capillary: 302 mg/dL — ABNORMAL HIGH (ref 70–99)

## 2022-03-23 MED ORDER — METOPROLOL TARTRATE 5 MG/5ML IV SOLN: 5.0000 mg | Freq: Four times a day (QID) | INTRAVENOUS | Status: DC

## 2022-03-23 MED ORDER — INSULIN ASPART 100 UNIT/ML IJ SOLN
0.0000 [IU] | Freq: Three times a day (TID) | INTRAMUSCULAR | Status: DC
  Administered 2022-03-23: 7 [IU] via SUBCUTANEOUS
  Administered 2022-03-24: 5 [IU] via SUBCUTANEOUS
  Administered 2022-03-24: 1 [IU] via SUBCUTANEOUS
  Administered 2022-03-24: 5 [IU] via SUBCUTANEOUS
  Administered 2022-03-24: 3 [IU] via SUBCUTANEOUS
  Administered 2022-03-25: 5 [IU] via SUBCUTANEOUS
  Administered 2022-03-25: 3 [IU] via SUBCUTANEOUS
  Administered 2022-03-25: 1 [IU] via SUBCUTANEOUS
  Administered 2022-03-25: 3 [IU] via SUBCUTANEOUS
  Administered 2022-03-26: 7 [IU] via SUBCUTANEOUS
  Administered 2022-03-26: 5 [IU] via SUBCUTANEOUS
  Administered 2022-03-26 – 2022-03-27 (×2): 3 [IU] via SUBCUTANEOUS
  Administered 2022-03-27: 7 [IU] via SUBCUTANEOUS
  Administered 2022-03-27 – 2022-03-28 (×2): 5 [IU] via SUBCUTANEOUS

## 2022-03-23 MED ORDER — METOPROLOL TARTRATE 5 MG/5ML IV SOLN
10.0000 mg | Freq: Four times a day (QID) | INTRAVENOUS | Status: DC
  Administered 2022-03-23: 10 mg via INTRAVENOUS
  Filled 2022-03-23: qty 10

## 2022-03-23 NOTE — Plan of Care (Signed)

## 2022-03-23 NOTE — Telephone Encounter (Signed)
I faxed last office note Tues but that is the only request I've had

## 2022-03-23 NOTE — Plan of Care (Signed)
  Problem: Fluid Volume: Goal: Ability to maintain a balanced intake and output will improve Outcome: Progressing   Problem: Metabolic: Goal: Ability to maintain appropriate glucose levels will improve Outcome: Progressing   Problem: Nutritional: Goal: Maintenance of adequate nutrition will improve Outcome: Progressing   Problem: Skin Integrity: Goal: Risk for impaired skin integrity will decrease Outcome: Progressing   Problem: Tissue Perfusion: Goal: Adequacy of tissue perfusion will improve Outcome: Progressing   Problem: Health Behavior/Discharge Planning: Goal: Ability to manage health-related needs will improve Outcome: Progressing   Problem: Clinical Measurements: Goal: Ability to maintain clinical measurements within normal limits will improve Outcome: Progressing Goal: Will remain free from infection Outcome: Progressing

## 2022-03-23 NOTE — NC FL2 (Signed)
[place] LEVEL OF CARE FORM     IDENTIFICATION  Patient Name: Jacob Barnes Birthdate: [birth year] Sex: male Admission Date (Current Location): 03/19/2022  [place] and [place] Number:   ([place])   Facility and Address:  [facility] [place], [place] [address], [place], [place] [address]      Provider Number: [id number]  Attending Physician Name and Address:  Little Ishikawa, MD  Relative Name and Phone Number:       Current Level of Care: Hospital Recommended Level of Care: [place] Prior Approval Number:    Date Approved/Denied:   PASRR Number:    Discharge Plan: SNF    Current Diagnoses: Patient Active Problem List   Diagnosis Date Noted   Subdural hematoma ([place]) 03/19/2022   Acute hypoxic respiratory failure ([place]) 03/19/2022   Allergy to alpha-gal 03/13/2022   Loss of weight 12/04/2021   Dyslipidemia 10/24/2021   Diarrhea 09/30/2021   Encounter for annual general medical examination with abnormal findings in adult 09/06/2021   Traumatic retroperitoneal hematoma 05/03/2021   Dementia ([place]) 01/24/2021   Frequent stools 06/18/2020   At high risk for falls 04/09/2020   Uncontrolled type 2 diabetes mellitus with hyperglycemia ([place]) 01/28/2020   Osteopenia 01/28/2020   Compression fracture of L1 lumbar vertebra ([place]) 01/09/2020   After-cataract with vision obscured 02/10/2019   Diabetic macular edema ([place]) 02/10/2019   Dry eye syndrome of bilateral lacrimal glands 02/10/2019   Lens replaced by other means 02/10/2019   Open-angle glaucoma 02/10/2019   Presence of intraocular lens 02/10/2019   Unspecified visual loss 02/10/2019   Type 2 diabetes mellitus with proliferative retinopathy of both eyes and macular edema ([place]) 02/10/2019   Type 2 diabetes mellitus with vascular disease ([place]) 10/20/2018   Bilateral pseudophakia 02/08/2016   Proliferative retinopathy with retinal edema due to type 2 diabetes mellitus  ([place]) 02/08/2016   Primary open angle glaucoma of both eyes 02/08/2016   CKD (chronic kidney disease) stage 3, GFR 30-59 ml/min ([place]) 12/15/2013   Anemia 12/11/2013   Mild protein-calorie malnutrition ([place]) 12/11/2013   Mixed hyperlipidemia 07/16/2013   GERD (gastroesophageal reflux disease) 06/27/2012   Background diabetic retinopathy ([place]) 03/28/2012   Retinal edema 03/28/2012   Tear film insufficiency 08/07/2011   Borderline glaucoma, open angle with borderline findings 03/22/2011   Essential hypertension, benign 01/02/2009    Orientation RESPIRATION BLADDER Height & Weight     Self, Time, Situation  Normal Incontinent Weight: 52.2 kg Height:  '5\' 4"'$  (162.6 cm)  BEHAVIORAL SYMPTOMS/MOOD NEUROLOGICAL BOWEL NUTRITION STATUS      Continent Diet (Regular)  AMBULATORY STATUS COMMUNICATION OF NEEDS Skin   Limited Assist Verbally Skin abrasions, Surgical wounds (skin tear to rt pretibial/ puncture site to rt groin)                       Personal Care Assistance Level of Assistance  Bathing, Feeding, Dressing Bathing Assistance: Limited assistance Feeding assistance: Independent Dressing Assistance: Limited assistance     Functional Limitations Info  Sight, Hearing, Speech Sight Info: Impaired Hearing Info: Adequate Speech Info: Adequate    SPECIAL CARE FACTORS FREQUENCY  PT (By licensed PT), OT (By licensed OT)     PT Frequency: 5x/wk OT Frequency: 5x/wk            Contractures Contractures Info: Not present    Additional Factors Info  Code Status, Allergies, Insulin Sliding Scale Code Status Info: Full Allergies Info:  Alpha-gal   Insulin Sliding Scale Info: Novolog 0-9 units SQ three times a day       Current Medications (03/23/2022):  This is the current hospital active medication list Current Facility-Administered Medications  Medication Dose Route Frequency Provider Last Rate Last Admin   acetaminophen (TYLENOL) tablet 650 mg  650 mg Oral Q6H PRN  Emokpae, Ejiroghene E, MD   650 mg at 03/22/22 0459   Or   acetaminophen (TYLENOL) suppository 650 mg  650 mg Rectal Q6H PRN Emokpae, Ejiroghene E, MD       amLODipine (NORVASC) tablet 5 mg  5 mg Oral Daily Caren Griffins, MD   5 mg at 03/23/22 7711   hydrALAZINE (APRESOLINE) injection 10 mg  10 mg Intravenous Q6H PRN Emokpae, Courage, MD   10 mg at 03/21/22 0015   insulin aspart (novoLOG) injection 0-9 Units  0-9 Units Subcutaneous TID AC & HS Little Ishikawa, MD       levETIRAcetam (KEPPRA) IVPB 500 mg/100 mL premix  500 mg Intravenous Q12H Emokpae, Ejiroghene E, MD 400 mL/hr at 03/23/22 1440 500 mg at 03/23/22 1440   LORazepam (ATIVAN) injection 0.5 mg  0.5 mg Intravenous Q4H PRN Emokpae, Ejiroghene E, MD   0.5 mg at 03/19/22 2048   ondansetron (ZOFRAN) tablet 4 mg  4 mg Oral Q6H PRN Emokpae, Ejiroghene E, MD       Or   ondansetron (ZOFRAN) injection 4 mg  4 mg Intravenous Q6H PRN Emokpae, Ejiroghene E, MD         Discharge Medications: Please see discharge summary for a list of discharge medications.  Relevant Imaging Results:  Relevant Lab Results:   Additional Information SS#:  [id number]  [name], RN

## 2022-03-23 NOTE — Progress Notes (Signed)
  NEUROSURGERY PROGRESS NOTE   No issues overnight. Pt sitting in bedside chair this evening, no c/o HA. Reports feeling well.  EXAM:  BP 116/69 (BP Location: Right Arm)   Pulse (!) 59   Temp 98 F (36.7 C) (Oral)   Resp 20   Ht '5\' 4"'$  (1.626 m)   Wt 52.2 kg   SpO2 99%   BMI 19.74 kg/m   Awake, alert, oriented  Speech fluent, appropriate  CN grossly intact  5/5 BUE/BLE   IMPRESSION:  81 y.o. male POD#1 s/p embolization of Left MMA for cSDH, doing well  PLAN: - Can d/c from neurosurgical standpoint, plan on outpatient follow-up in 3-4 weeks with repeat CTH without contrast.   Consuella Lose, MD [facility]

## 2022-03-23 NOTE — TOC Initial Note (Signed)
Transition of Care [facility]) - Initial/Assessment Note    Patient Details  Name: Jacob Barnes MRN: [id number] Date of Birth: [birth year]  Transition of Care [facility] CM/SW Contact:    [name], RN Phone Number: 03/23/2022, 4:33 PM  Clinical Narrative:                 Pt is from home with his spouse.. Current recommendations are for SNF. CM spoke to patients spouse and they live in [place], [place]. She asked that he be faxed out in the [place] area.  TOC will f/u in am for bed offers and start insurance auth once facility selected.   Expected Discharge Plan: Skilled Nursing Facility Barriers to Discharge: Continued Medical Work up   Patient Goals and CMS Choice   CMS Medicare.gov Compare Post Acute Care list provided to:: Patient Represenative (must comment) Choice offered to / list presented to : Spouse  Expected Discharge Plan and Services Expected Discharge Plan: [place] In-house Referral: Clinical Social Work Discharge Planning Services: CM Consult Post Acute Care Choice: [place] arrangements for the past 2 months: [place]                                      Prior Living Arrangements/Services Living arrangements for the past 2 months: Single Family Home Lives with:: Spouse Patient language and need for interpreter reviewed:: Yes Do you feel safe going back to the place where you live?: Yes            Criminal Activity/Legal Involvement Pertinent to Current Situation/Hospitalization: No - Comment as needed  Activities of Daily Living Home Assistive Devices/Equipment: Cane (specify quad or straight) ADL Screening (condition at time of admission) Patient's cognitive ability adequate to safely complete daily activities?: No Is the patient deaf or have difficulty hearing?: No Does the patient have difficulty seeing, even when wearing glasses/contacts?: No Does the patient have difficulty concentrating,  remembering, or making decisions?: Yes Patient able to express need for assistance with ADLs?: Yes Does the patient have difficulty dressing or bathing?: Yes Independently performs ADLs?: No Does the patient have difficulty walking or climbing stairs?: No Weakness of Legs: None Weakness of Arms/Hands: None  Permission Sought/Granted                  Emotional Assessment Appearance:: Appears stated age     Orientation: : Oriented to Self, Oriented to Place, Oriented to  Time      Admission diagnosis:  Subdural hematoma ([place]) [S06.5XAA] Hyperglycemia [R73.9] Patient Active Problem List   Diagnosis Date Noted   Subdural hematoma ([place]) 03/19/2022   Acute hypoxic respiratory failure ([place]) 03/19/2022   Allergy to alpha-gal 03/13/2022   Loss of weight 12/04/2021   Dyslipidemia 10/24/2021   Diarrhea 09/30/2021   Encounter for annual general medical examination with abnormal findings in adult 09/06/2021   Traumatic retroperitoneal hematoma 05/03/2021   Dementia ([place]) 01/24/2021   Frequent stools 06/18/2020   At high risk for falls 04/09/2020   Uncontrolled type 2 diabetes mellitus with hyperglycemia ([place]) 01/28/2020   Osteopenia 01/28/2020   Compression fracture of L1 lumbar vertebra ([place]) 01/09/2020   After-cataract with vision obscured 02/10/2019   Diabetic macular edema ([place]) 02/10/2019   Dry eye syndrome of bilateral lacrimal glands 02/10/2019   Lens replaced by other means 02/10/2019   Open-angle glaucoma 02/10/2019   Presence of intraocular  lens 02/10/2019   Unspecified visual loss 02/10/2019   Type 2 diabetes mellitus with proliferative retinopathy of both eyes and macular edema ([place]) 02/10/2019   Type 2 diabetes mellitus with vascular disease ([place]) 10/20/2018   Bilateral pseudophakia 02/08/2016   Proliferative retinopathy with retinal edema due to type 2 diabetes mellitus ([place]) 02/08/2016   Primary open angle glaucoma of both eyes 02/08/2016   CKD (chronic kidney  disease) stage 3, GFR 30-59 ml/min (HCC) 12/15/2013   Anemia 12/11/2013   Mild protein-calorie malnutrition ([place]) 12/11/2013   Mixed hyperlipidemia 07/16/2013   GERD (gastroesophageal reflux disease) 06/27/2012   Background diabetic retinopathy ([place]) 03/28/2012   Retinal edema 03/28/2012   Tear film insufficiency 08/07/2011   Borderline glaucoma, open angle with borderline findings 03/22/2011   Essential hypertension, benign 01/02/2009   PCP:  Fayrene Helper, MD Pharmacy:   [id number] [id number]: [phone]: [phone]    Social Determinants of Health (SDOH) Interventions    Readmission Risk Interventions     No data to display

## 2022-03-23 NOTE — Progress Notes (Signed)
PROGRESS NOTE  Jacob Barnes VOZ:[id number] DOB: 1941-02-14 DOA: 03/19/2022 PCP: Fayrene Helper, MD   LOS: 4 days   Brief Narrative / Interim history: 81 year old male with history of mild dementia, DM2, HTN, CKD 3A with baseline creatinine 1.1-1.3, comes into the hospital with increased confusion, poor coordination per family.  He initially presented to [facility] ED, found to have a subdural hematoma, and then transferred to [facility] for neurosurgery evaluation  Subjective / 24h Interval events: No acute issues or events overnight denies nausea vomiting diarrhea constipation fevers chills or chest pain.  Patient does report bilateral frontal headache improved with Tylenol.  Assesement and Plan: Principal Problem:   Subdural hematoma (HCC) Active Problems:   Acute hypoxic respiratory failure (HCC)   Essential hypertension, benign   CKD (chronic kidney disease) stage 3, GFR 30-59 ml/min (HCC)   Uncontrolled type 2 diabetes mellitus with hyperglycemia (HCC)   Dementia (HCC)  Subdural hematoma  -Without reported trauma -CT scan on admission showed left cerebral subdural hematoma with mass effect 4 - 5 millimeters rightward midline shift.  Repeat CT scan shows stability.   -Neurosurgery following, successful Onyx embolization of left middle meningeal artery 12/13 -Well-controlled on amlodipine  Acute metabolic encephalopathy-improving likely due to above.  Urinalysis not suggestive of UTI, chest x-ray without evidence of significant infection.  Hypoxemia-resolved, transient episode - now on room air  Mild dementia-apparently was quite functional prior to this presentation, able to take care of himself, performing all his ADLs, driving independently  DM2-uncontrolled, with hyperglycemia.  Placed on sliding scale, hypoglycemic protocol  CBG (last 3)   Acute kidney injury, resolved Patient carries diagnosis of CKD 3A in the chart however his GFR is >60 which appears to be his  baseline.  Would have patient follow-up with PCP and possibly nephrology for further discussion on this diagnosis  Hypokalemia-repleted -follow repeat labs  Essential hypertension-blood pressure controlled, start amlodipine  Chronic anemia-normocytic, hemoglobin stable, no bleeding  Scheduled Meds:  amLODipine  5 mg Oral Daily   insulin aspart  0-9 Units Subcutaneous Q6H   metoprolol tartrate  10 mg Intravenous Q6H   Continuous Infusions:  levETIRAcetam 500 mg (03/23/22 0238)   PRN Meds:.acetaminophen **OR** acetaminophen, hydrALAZINE, LORazepam, ondansetron **OR** ondansetron (ZOFRAN) IV  Current Outpatient Medications  Medication Instructions   benazepril (LOTENSIN) 20 mg, Oral, Daily   CALCIUM PO 1 tablet, Oral, 3 times weekly   Cholecalciferol (VITAMIN D3 PO) 1 tablet, Oral, 3 times weekly   diphenoxylate-atropine (LOMOTIL) 2.5-0.025 MG tablet TAKE ONE TABLET BY MOUTH THREE TIMES WEEKLY AS NEEDED, FOR LOOSE STOOL   donepezil (ARICEPT) 10 mg, Oral, Daily at bedtime   dorzolamide-timolol (COSOPT) 22.3-6.8 MG/ML ophthalmic solution 1 drop, 3 times weekly   Insulin Pen Needle (PEN NEEDLES) 31G X 6 MM MISC 1 each, Does not apply, Daily at bedtime   Iron, Ferrous Sulfate, 325 (65 Fe) MG TABS Take one tablet by mouth once daily   latanoprost (XALATAN) 0.005 % ophthalmic solution 1 drop, 3 times weekly   linagliptin (TRADJENTA) 5 mg, Oral, Daily with breakfast   pantoprazole (PROTONIX) 40 MG tablet TAKE 1 TABLET BY MOUTH EVERY DAY   rosuvastatin (CRESTOR) 5 MG tablet Take one tablet by mouth every Monday , Wednesday and Friday   Tresiba FlexTouch 5 Units, Subcutaneous, Daily at bedtime    Diet Orders (From admission, onward)     Start     Ordered   03/22/22 2119  Diet regular Room service appropriate? Yes; Fluid consistency:  Thin  Diet effective now       Question Answer Comment  Room service appropriate? Yes   Fluid consistency: Thin      03/22/22 2118            DVT  prophylaxis: SCDs Start: 03/19/22 2116   Lab Results  Component Value Date   PLT 97 (L) 03/21/2022      Code Status: Full Code  Family Communication: Sister was present at bedside Status is: Inpatient Remains inpatient appropriate because: severity of illness  Level of care: Progressive  Consultants:  Neurosurgery   Objective: Vitals:   03/22/22 2239 03/22/22 2342 03/23/22 0115 03/23/22 0213  BP: 118/79 118/71 105/60 113/65  Pulse: 84 73 61 68  Resp: '16 16 16 18  '$ Temp: 97.9 F (36.6 C) 98 F (36.7 C) 98.4 F (36.9 C) 98.2 F (36.8 C)  TempSrc: Oral Axillary Axillary Oral  SpO2: 97% 100% 92% 100%  Weight:      Height:        Intake/Output Summary (Last 24 hours) at 03/23/2022 0654 Last data filed at 03/22/2022 1945 Gross per 24 hour  Intake 900 ml  Output 755 ml  Net 145 ml    Wt Readings from Last 3 Encounters:  03/22/22 52.2 kg  03/13/22 52.1 kg  02/16/22 51.3 kg    Examination:  General:  Pleasantly resting in bed, No acute distress. HEENT:  Normocephalic atraumatic.  Sclerae nonicteric, noninjected.  Extraocular movements intact bilaterally. Neck:  Without mass or deformity.  Trachea is midline. Lungs:  Clear to auscultate bilaterally without rhonchi, wheeze, or rales. Heart:  Regular rate and rhythm.  Without murmurs, rubs, or gallops. Abdomen:  Soft, nontender, nondistended.  Without guarding or rebound. Extremities: Without cyanosis, clubbing, edema, or obvious deformity. Vascular:  Dorsalis pedis and posterior tibial pulses palpable bilaterally.  Data Reviewed: I have independently reviewed following labs and imaging studies   CBC Recent Labs  Lab 03/19/22 1615 03/19/22 1652 03/20/22 0532 03/21/22 0243  WBC 7.1  --  9.8 7.2  HGB 9.9* 10.5* 10.3* 10.1*  HCT 30.9* 31.0* 31.9* 31.1*  PLT 112*  --  106* 97*  MCV 93.4  --  93.3 91.7  MCH 29.9  --  30.1 29.8  MCHC 32.0  --  32.3 32.5  RDW 16.1*  --  16.1* 15.9*  LYMPHSABS 1.8  --   --    --   MONOABS 1.6*  --   --   --   EOSABS 0.0  --   --   --   BASOSABS 0.0  --   --   --      Recent Labs  Lab 03/19/22 1615 03/19/22 1652 03/20/22 0532 03/21/22 0243  NA 136 140 138 138  K 3.6 3.7 3.4* 3.4*  CL 100 98 108 104  CO2 28  --  28 27  GLUCOSE 375* 378* 122* 125*  BUN '18 16 13 9  '$ CREATININE 1.52* 1.60* 1.14 1.03  CALCIUM 8.3*  --  8.0* 8.1*  AST 25  --   --   --   ALT 16  --   --   --   ALKPHOS 101  --   --   --   BILITOT 0.4  --   --   --   ALBUMIN 3.5  --   --   --   INR 1.1  --   --   --      ------------------------------------------------------------------------------------------------------------------ No results for input(s): "  CHOL", "HDL", "LDLCALC", "TRIG", "CHOLHDL", "LDLDIRECT" in the last 72 hours.  Lab Results  Component Value Date   HGBA1C 9.1 (A) 01/26/2022   ------------------------------------------------------------------------------------------------------------------ No results for input(s): "TSH", "T4TOTAL", "T3FREE", "THYROIDAB" in the last 72 hours.  Invalid input(s): "FREET3"  Cardiac Enzymes No results for input(s): "CKMB", "TROPONINI", "MYOGLOBIN" in the last 168 hours.  Invalid input(s): "CK" ------------------------------------------------------------------------------------------------------------------ No results found for: "BNP"  CBG: Recent Labs  Lab 03/21/22 2232 03/22/22 0242 03/22/22 0647 03/22/22 1416 03/22/22 1813  GLUCAP 381* 111* 130* 121* 133*     Recent Results (from the past 240 hour(s))  Surgical pcr screen     Status: None   Collection Time: 03/22/22  3:31 AM   Specimen: Nasal Mucosa; Nasal Swab  Result Value Ref Range Status   MRSA, PCR NEGATIVE NEGATIVE Final   Staphylococcus aureus NEGATIVE NEGATIVE Final    Comment: (NOTE) The Xpert SA Assay (FDA approved for NASAL specimens in patients 6 years of age and older), is one component of a comprehensive surveillance program. It is not intended  to diagnose infection nor to guide or monitor treatment. Performed at [facility], [place] [address] [place], [place] [address]      Radiology Studies: No results found.   Holli Humbles DO [facility]  Between 7 am - 7 pm I am available, please contact me via Amion (for emergencies) or Securechat (non urgent messages)  Between 7 pm - 7 am I am not available, please contact night coverage on amion

## 2022-03-23 NOTE — Evaluation (Signed)
Occupational Therapy Evaluation Patient Details Name: NICKOLAI RINKS MRN: [id number] DOB: 13-Feb-1941 Today's Date: 03/23/2022   History of Present Illness Pt is 81 yo male admitted 03/19/22 with subdural hematoma.  Neurosurgery consulted and pt for possible MMA embolization.  Pt with hx including but not limited to mild dementia, DM2, HTN, CKD, DM, and arthritis   Clinical Impression   PTA, pt lived with wife who assisted with ADL and IADL; per wife pt was occasionally driving but she encouraged him not to. Upon eval, pt presents with decreased activity tolerance, safety, awareness, cognition, and balance. Performing LB ADL with min-mod A and cues for safety, and UB ADL with set-up. Pt requires cues to follow commands, attention to task, and safety with all transfers and functional mobility. Recommending ST-SNF to optimize safety, family education, and independence in ADL and IADL.      Recommendations for follow up therapy are one component of a multi-disciplinary discharge planning process, led by the attending physician.  Recommendations may be updated based on patient status, additional functional criteria and insurance authorization.   Follow Up Recommendations  Skilled nursing-short term rehab (<3 hours/day)     Assistance Recommended at Discharge Frequent or constant Supervision/Assistance  Patient can return home with the following A little help with walking and/or transfers;A little help with bathing/dressing/bathroom;Assistance with cooking/housework;Direct supervision/assist for medications management;Direct supervision/assist for financial management;Assist for transportation;Help with stairs or ramp for entrance;Assistance with feeding    Functional Status Assessment  Patient has had a recent decline in their functional status and demonstrates the ability to make significant improvements in function in a reasonable and predictable amount of time.  Equipment Recommendations  Other  (comment) (defer to next venue)    Recommendations for Other Services       Precautions / Restrictions Precautions Precautions: Fall;Other (comment) Precaution Comments: systolic BP <381 Restrictions Weight Bearing Restrictions: No      Mobility Bed Mobility Overal bed mobility: Needs Assistance Bed Mobility: Supine to Sit, Sit to Supine     Supine to sit: Supervision Sit to supine: Supervision   General bed mobility comments: No assist but pt has very little eccentric quad control when going from sittting to standing resulting in pt sitting but rocking back into the bed.    Transfers Overall transfer level: Needs assistance Equipment used: Rolling walker (2 wheels) Transfers: Sit to/from Stand Sit to Stand: Min guard           General transfer comment: guarding due to posterior lean intermittently placing pt at increased risk for falls.      Balance Overall balance assessment: Needs assistance Sitting-balance support: No upper extremity supported Sitting balance-Leahy Scale: Good     Standing balance support: Bilateral upper extremity supported, No upper extremity supported Standing balance-Leahy Scale: Fair Standing balance comment: Pt continues with posterior lean with standing requiring contact guard assist in order to maintain upright balance intermittently with and without UE support.                           ADL either performed or assessed with clinical judgement   ADL Overall ADL's : Needs assistance/impaired Eating/Feeding: Sitting;Set up Eating/Feeding Details (indicate cue type and reason): moderate spills Grooming: Wash/dry hands;Min guard;Standing Grooming Details (indicate cue type and reason): Min gaurd A for safety; mod cues for locating automatic soap despenser Upper Body Bathing: Set up;Sitting   Lower Body Bathing: Sit to/from stand;Minimal assistance   Upper Body Dressing :  Set up;Sitting   Lower Body Dressing: Min  guard;Sit to/from stand   Toilet Transfer: Min guard;Minimal assistance;Ambulation;Rolling walker (2 wheels);Comfort height toilet Toilet Transfer Details (indicate cue type and reason): Min guard A for safety Toileting- Clothing Manipulation and Hygiene: Set up;Sitting/lateral lean       Functional mobility during ADLs: Min guard;Minimal assistance;Rolling walker (2 wheels) General ADL Comments: Intermittent min A during functional mobility due tto minor LOB when scanning for items in hall     Vision Baseline Vision/History: 1 Wears glasses Vision Assessment?: Vision impaired- to be further tested in functional context Additional Comments: Pt not reporting any changes in vision, glasses not present; able to scan to locate items in hall.     Perception Perception Perception Tested?: No   Praxis Praxis Praxis tested?: Not tested    Pertinent Vitals/Pain Pain Assessment Pain Assessment: No/denies pain     Hand Dominance Right   Extremity/Trunk Assessment Upper Extremity Assessment Upper Extremity Assessment: Generalized weakness;RUE deficits/detail (BUE with decr dexterity and ROM, but likely near baseline.) RUE Deficits / Details: Prior R MF amputation.   Lower Extremity Assessment Lower Extremity Assessment: Defer to PT evaluation   Cervical / Trunk Assessment Cervical / Trunk Assessment: Normal   Communication Communication Communication: HOH;Expressive difficulties (Some expressive difficulties)   Cognition Arousal/Alertness: Awake/alert Behavior During Therapy: WFL for tasks assessed/performed Overall Cognitive Status: No family/caregiver present to determine baseline cognitive functioning                                 General Comments: Hx of dementia; followed basic commands; Oriented to self, location. Unable to follow any multistep commands, and requires up to max cues for attention to task     General Comments  VSS    Exercises      Shoulder Instructions      Home Living Family/patient expects to be discharged to:: Private residence Living Arrangements: Spouse/significant other Available Help at Discharge: Family;Available 24 hours/day Type of Home: Mobile home Home Access: Stairs to enter Entrance Stairs-Number of Steps: 4 Entrance Stairs-Rails: Right;Left;Can reach both Home Layout: One level     Bathroom Shower/Tub: Walk-in shower;Tub/shower unit   Bathroom Toilet: Standard     Home Equipment: Shower seat;Cane - single Barista (2 wheels)   Additional Comments: spouse provided history as pt hard of hearing and also finishing lunch; inability to multitask and potentially poor historian      Prior Functioning/Environment Prior Level of Function : Independent/Modified Independent             Mobility Comments: Pt ambulated with cane ; wife reports unsteady at times - states he normally catches himself but has fallen (had severe fall off step stool in Jan 2023) ADLs Comments: Pt's wife assist LB ADl and attempts to assist with medication management, but reports pt with poor compliance and not taking when she asks recently.        OT Problem List: Decreased strength;Decreased activity tolerance;Impaired balance (sitting and/or standing);Decreased coordination;Decreased cognition;Decreased safety awareness;Decreased knowledge of use of DME or AE      OT Treatment/Interventions: Self-care/ADL training;Therapeutic exercise;DME and/or AE instruction;Therapeutic activities;Cognitive remediation/compensation;Patient/family education;Balance training    OT Goals(Current goals can be found in the care plan section) Acute Rehab OT Goals Patient Stated Goal: none stated OT Goal Formulation: With patient Time For Goal Achievement: 04/06/22 Potential to Achieve Goals: Good  OT Frequency: Min 2X/week    Co-evaluation  AM-PAC OT "6 Clicks" Daily Activity     Outcome Measure  Help from another person eating meals?: A Little Help from another person taking care of personal grooming?: A Little Help from another person toileting, which includes using toliet, bedpan, or urinal?: A Little Help from another person bathing (including washing, rinsing, drying)?: A Little Help from another person to put on and taking off regular upper body clothing?: A Little Help from another person to put on and taking off regular lower body clothing?: A Little 6 Click Score: 18   End of Session Equipment Utilized During Treatment: Gait belt;Rolling walker (2 wheels) Nurse Communication: Mobility status  Activity Tolerance: Patient tolerated treatment well Patient left: in chair;with call bell/phone within reach;with chair alarm set;with family/visitor present  OT Visit Diagnosis: Unsteadiness on feet (R26.81);Other abnormalities of gait and mobility (R26.89);Muscle weakness (generalized) (M62.81);History of falling (Z91.81);Other symptoms and signs involving cognitive function                Time: 1400-1436 OT Time Calculation (min): 36 min Charges:  OT General Charges $OT Visit: 1 Visit OT Evaluation $OT Eval Moderate Complexity: 1 Mod OT Treatments $Self Care/Home Management : 8-22 mins  Elder Cyphers, OTR/L [facility] Acute Rehabilitation Office: [phone]   Magnus Ivan 03/23/2022, 4:13 PM

## 2022-03-23 NOTE — Telephone Encounter (Signed)
OK. Thanks. From my understanding that's what they needed.

## 2022-03-23 NOTE — Progress Notes (Signed)
Physical Therapy Treatment Patient Details Name: Jacob Barnes MRN: [id number] DOB: 1940/06/28 Today's Date: 03/23/2022   History of Present Illness Pt is 81 yo male admitted 03/19/22 with subdural hematoma.  Neurosurgery consulted and pt for possible MMA embolization.  Pt with hx including but not limited to mild dementia, DM2, HTN, CKD, DM, and arthritis    PT Comments    Pt is progressing well towards goals. He continues with posterior lean in standing demonstrating an increased risk for falls with overall a need for CGA for all activities. Pt will benefit from continued skilled physical therapy services on discharge from acute care hospital setting. If pt has 24/7 physical assistance at home then he would benefit from [facility] but if pt does not have physical assistance at home he will benefit from short stay in SNF prior to discharge home in order to decrease risk for falls, injury and re-hospitalization. Pt demonstrates no signs/symptoms of cardiac/respiratory distress with activities this session. Pt continues with significant quad weakness making it unsafe when pt is going from standing to sitting and descending stairs.     Recommendations for follow up therapy are one component of a multi-disciplinary discharge planning process, led by the attending physician.  Recommendations may be updated based on patient status, additional functional criteria and insurance authorization.  Follow Up Recommendations  Skilled nursing-short term rehab (<3 hours/day) Can patient physically be transported by private vehicle: Yes   Assistance Recommended at Discharge Frequent or constant Supervision/Assistance  Patient can return home with the following A lot of help with bathing/dressing/bathroom;A lot of help with walking and/or transfers;Assistance with cooking/housework;Help with stairs or ramp for entrance   Equipment Recommendations  None recommended by PT    Recommendations for Other Services        Precautions / Restrictions Precautions Precautions: Fall;Other (comment) Precaution Comments: systolic BP <791 Restrictions Weight Bearing Restrictions: No     Mobility  Bed Mobility Overal bed mobility: Needs Assistance Bed Mobility: Supine to Sit, Sit to Supine     Supine to sit: Supervision Sit to supine: Supervision   General bed mobility comments: No assist but pt has very little eccentric quad control when going from sittting to standing resulting in pt sitting but rocking back into the bed. Patient Response: Cooperative  Transfers Overall transfer level: Needs assistance Equipment used: Rolling walker (2 wheels) Transfers: Sit to/from Stand Sit to Stand: Min guard           General transfer comment: guarding due to posterior lean intermittently placing pt at increased risk for falls.    Ambulation/Gait Ambulation/Gait assistance: Min guard Gait Distance (Feet): 200 Feet Assistive device: Rolling walker (2 wheels) Gait Pattern/deviations: Step-through pattern, Decreased stride length, Narrow base of support Gait velocity: decreased cadence. Gait velocity interpretation: <1.8 ft/sec, indicate of risk for recurrent falls   General Gait Details: Pt has intermittent posterior LOB requiring CGA, very narrow BOS with gait   Stairs Stairs: Yes Stairs assistance: Min guard Stair Management: Two rails, Alternating pattern Number of Stairs: 2 General stair comments: Pt has more difficulty with poor eccentric quad control on descending stairs with CGA but no LOB for 2 stairs.   Wheelchair Mobility    Modified Rankin (Stroke Patients Only)       Balance Overall balance assessment: Needs assistance Sitting-balance support: No upper extremity supported Sitting balance-Leahy Scale: Good     Standing balance support: Bilateral upper extremity supported, No upper extremity supported Standing balance-Leahy Scale: Fair Standing balance comment:  Pt continues with  posterior lean with standing requiring contact guard assist in order to maintain upright balance intermittently with and without UE support.            Cognition Arousal/Alertness: Awake/alert Behavior During Therapy: WFL for tasks assessed/performed Overall Cognitive Status: No family/caregiver present to determine baseline cognitive functioning             General Comments: Hx of dementia; followed basic commands; Oriented to self, location               Pertinent Vitals/Pain Pain Assessment Pain Assessment: No/denies pain     PT Goals (current goals can now be found in the care plan section) Acute Rehab PT Goals Patient Stated Goal: return home if able; spoke with spouse who is agreeable to rehab if needed but prefers somewhere in [place] PT Goal Formulation: With patient/family Time For Goal Achievement: 04/04/22 Potential to Achieve Goals: Good Progress towards PT goals: Progressing toward goals    Frequency    Min 3X/week      PT Plan Current plan remains appropriate       AM-PAC PT "6 Clicks" Mobility   Outcome Measure  Help needed turning from your back to your side while in a flat bed without using bedrails?: A Little Help needed moving from lying on your back to sitting on the side of a flat bed without using bedrails?: A Little Help needed moving to and from a bed to a chair (including a wheelchair)?: A Little Help needed standing up from a chair using your arms (e.g., wheelchair or bedside chair)?: A Little Help needed to walk in hospital room?: A Little Help needed climbing 3-5 steps with a railing? : A Little 6 Click Score: 18    End of Session Equipment Utilized During Treatment: Gait belt Activity Tolerance: Patient tolerated treatment well Patient left: in bed;with call bell/phone within reach;with bed alarm set Nurse Communication: Mobility status PT Visit Diagnosis: Unsteadiness on feet (R26.81);History of falling (Z91.81)      Time: 1020-1040 PT Time Calculation (min) (ACUTE ONLY): 20 min  Charges:  $Gait Training: 8-22 mins                     Tomma Rakers, DPT, [place] Office: [phone] (Secure chat preferred)    Ander Purpura 03/23/2022, 12:20 PM

## 2022-03-24 DIAGNOSIS — S065XAA Traumatic subdural hemorrhage with loss of consciousness status unknown, initial encounter: Secondary | ICD-10-CM | POA: Diagnosis not present

## 2022-03-24 LAB — GLUCOSE, CAPILLARY
Glucose-Capillary: 134 mg/dL — ABNORMAL HIGH (ref 70–99)
Glucose-Capillary: 248 mg/dL — ABNORMAL HIGH (ref 70–99)
Glucose-Capillary: 259 mg/dL — ABNORMAL HIGH (ref 70–99)

## 2022-03-24 NOTE — Plan of Care (Signed)

## 2022-03-24 NOTE — Progress Notes (Signed)
PROGRESS NOTE  Jacob Barnes KXF:[id number] DOB: 1941-03-28 DOA: 03/19/2022 PCP: Fayrene Helper, MD   LOS: 5 days   Brief Narrative / Interim history: 81 year old male with history of mild dementia, DM2, HTN, CKD 3A with baseline creatinine 1.1-1.3, comes into the hospital with increased confusion, poor coordination per family.  He initially presented to [facility] ED, found to have a subdural hematoma, and then transferred to [facility] for neurosurgery evaluation.  Patient subdural hematoma appears of stabilized, status post middle meningeal artery embolization on the 13th otherwise stable and for discharge per neurosurgery.  Currently awaiting safe disposition at nursing facility/rehab.  Subjective / 24h Interval events: No acute issues or events overnight denies nausea vomiting diarrhea constipation fevers chills or chest pain.  Patient does report bilateral frontal headache improved with Tylenol.  Assesement and Plan: Principal Problem:   Subdural hematoma (HCC) Active Problems:   Acute hypoxic respiratory failure (HCC)   Essential hypertension, benign   CKD (chronic kidney disease) stage 3, GFR 30-59 ml/min (HCC)   Uncontrolled type 2 diabetes mellitus with hyperglycemia (HCC)   Dementia (HCC)  Subdural hematoma  -Without reported trauma -CT scan on admission showed left cerebral subdural hematoma with mass effect 4 - 5 millimeters rightward midline shift.  Repeat CT scan shows stability.   -Neurosurgery following, successful Onyx embolization of left middle meningeal artery 12/13 -Well-controlled on amlodipine  Acute metabolic encephalopathy-improving likely due to above.  Urinalysis not suggestive of UTI, chest x-ray without evidence of significant infection.  Hypoxemia-resolved, transient episode - now on room air  Mild dementia-apparently was quite functional prior to this presentation, able to take care of himself, performing all his ADLs, driving  independently  DM2-uncontrolled, with hyperglycemia.  Placed on sliding scale, hypoglycemic protocol  CBG (last 3)   Acute kidney injury, resolved Patient carries diagnosis of CKD 3A in the chart however his baseline GFR is >60.  Would have patient follow-up with PCP and possibly nephrology for further discussion on this diagnosis  Hypokalemia-repleted -follow repeat labs  Essential hypertension-blood pressure controlled, start amlodipine  Chronic anemia-normocytic, hemoglobin stable, no bleeding  Scheduled Meds:  amLODipine  5 mg Oral Daily   insulin aspart  0-9 Units Subcutaneous TID AC & HS   Continuous Infusions:  levETIRAcetam 500 mg (03/24/22 0359)   PRN Meds:.acetaminophen **OR** acetaminophen, hydrALAZINE, LORazepam, ondansetron **OR** ondansetron (ZOFRAN) IV  Current Outpatient Medications  Medication Instructions   benazepril (LOTENSIN) 20 mg, Oral, Daily   CALCIUM PO 1 tablet, Oral, 3 times weekly   Cholecalciferol (VITAMIN D3 PO) 1 tablet, Oral, 3 times weekly   diphenoxylate-atropine (LOMOTIL) 2.5-0.025 MG tablet TAKE ONE TABLET BY MOUTH THREE TIMES WEEKLY AS NEEDED, FOR LOOSE STOOL   donepezil (ARICEPT) 10 mg, Oral, Daily at bedtime   dorzolamide-timolol (COSOPT) 22.3-6.8 MG/ML ophthalmic solution 1 drop, 3 times weekly   Insulin Pen Needle (PEN NEEDLES) 31G X 6 MM MISC 1 each, Does not apply, Daily at bedtime   Iron, Ferrous Sulfate, 325 (65 Fe) MG TABS Take one tablet by mouth once daily   latanoprost (XALATAN) 0.005 % ophthalmic solution 1 drop, 3 times weekly   linagliptin (TRADJENTA) 5 mg, Oral, Daily with breakfast   pantoprazole (PROTONIX) 40 MG tablet TAKE 1 TABLET BY MOUTH EVERY DAY   rosuvastatin (CRESTOR) 5 MG tablet Take one tablet by mouth every Monday , Wednesday and Friday   Tresiba FlexTouch 5 Units, Subcutaneous, Daily at bedtime    Diet Orders (From admission, onward)  Start     Ordered   03/22/22 2119  Diet regular Room service  appropriate? Yes; Fluid consistency: Thin  Diet effective now       Question Answer Comment  Room service appropriate? Yes   Fluid consistency: Thin      03/22/22 2118            DVT prophylaxis: SCDs Start: 03/19/22 2116   Lab Results  Component Value Date   PLT 97 (L) 03/21/2022      Code Status: Full Code  Family Communication: Sister at bedside Status is: Inpatient Remains inpatient appropriate because: severity of illness  Level of care: Progressive  Consultants:  Neurosurgery   Objective: Vitals:   03/23/22 0800 03/23/22 1941 03/23/22 2350 03/24/22 0320  BP: 116/69 119/66 114/61 100/60  Pulse: (!) 59 71 68 65  Resp: '20 16 16 16  '$ Temp: 98 F (36.7 C) 98.1 F (36.7 C) 98.3 F (36.8 C) 97.9 F (36.6 C)  TempSrc: Oral Oral Oral Oral  SpO2: 99% 100% 100% 100%  Weight:      Height:        Intake/Output Summary (Last 24 hours) at 03/24/2022 0754 Last data filed at 03/23/2022 1440 Gross per 24 hour  Intake 505.34 ml  Output --  Net 505.34 ml    Wt Readings from Last 3 Encounters:  03/22/22 52.2 kg  03/13/22 52.1 kg  02/16/22 51.3 kg    Examination:  General:  Pleasantly resting in bed, No acute distress. HEENT:  Normocephalic atraumatic.  Sclerae nonicteric, noninjected.  Extraocular movements intact bilaterally. Neck:  Without mass or deformity.  Trachea is midline. Lungs:  Clear to auscultate bilaterally without rhonchi, wheeze, or rales. Heart:  Regular rate and rhythm.  Without murmurs, rubs, or gallops. Abdomen:  Soft, nontender, nondistended.  Without guarding or rebound. Extremities: Without cyanosis, clubbing, edema, or obvious deformity. Vascular:  Dorsalis pedis and posterior tibial pulses palpable bilaterally.  Data Reviewed: I have independently reviewed following labs and imaging studies   CBC Recent Labs  Lab 03/19/22 1615 03/19/22 1652 03/20/22 0532 03/21/22 0243  WBC 7.1  --  9.8 7.2  HGB 9.9* 10.5* 10.3* 10.1*  HCT  30.9* 31.0* 31.9* 31.1*  PLT 112*  --  106* 97*  MCV 93.4  --  93.3 91.7  MCH 29.9  --  30.1 29.8  MCHC 32.0  --  32.3 32.5  RDW 16.1*  --  16.1* 15.9*  LYMPHSABS 1.8  --   --   --   MONOABS 1.6*  --   --   --   EOSABS 0.0  --   --   --   BASOSABS 0.0  --   --   --      Recent Labs  Lab 03/19/22 1615 03/19/22 1652 03/20/22 0532 03/21/22 0243  NA 136 140 138 138  K 3.6 3.7 3.4* 3.4*  CL 100 98 108 104  CO2 28  --  28 27  GLUCOSE 375* 378* 122* 125*  BUN '18 16 13 9  '$ CREATININE 1.52* 1.60* 1.14 1.03  CALCIUM 8.3*  --  8.0* 8.1*  AST 25  --   --   --   ALT 16  --   --   --   ALKPHOS 101  --   --   --   BILITOT 0.4  --   --   --   ALBUMIN 3.5  --   --   --   INR 1.1  --   --   --      ------------------------------------------------------------------------------------------------------------------  No results for input(s): "CHOL", "HDL", "LDLCALC", "TRIG", "CHOLHDL", "LDLDIRECT" in the last 72 hours.  Lab Results  Component Value Date   HGBA1C 9.1 (A) 01/26/2022   ------------------------------------------------------------------------------------------------------------------ No results for input(s): "TSH", "T4TOTAL", "T3FREE", "THYROIDAB" in the last 72 hours.  Invalid input(s): "FREET3"  Cardiac Enzymes No results for input(s): "CKMB", "TROPONINI", "MYOGLOBIN" in the last 168 hours.  Invalid input(s): "CK" ------------------------------------------------------------------------------------------------------------------ No results found for: "BNP"  CBG: Recent Labs  Lab 03/23/22 0216 03/23/22 0844 03/23/22 1316 03/23/22 1941 03/24/22 0603  GLUCAP 175* 125* 288* 302* 134*     Recent Results (from the past 240 hour(s))  Surgical pcr screen     Status: None   Collection Time: 03/22/22  3:31 AM   Specimen: Nasal Mucosa; Nasal Swab  Result Value Ref Range Status   MRSA, PCR NEGATIVE NEGATIVE Final   Staphylococcus aureus NEGATIVE NEGATIVE Final     Comment: (NOTE) The Xpert SA Assay (FDA approved for NASAL specimens in patients 76 years of age and older), is one component of a comprehensive surveillance program. It is not intended to diagnose infection nor to guide or monitor treatment. Performed at [facility], [place] [address] [place], [place] [address]      Radiology Studies: No results found.   Holli Humbles DO [facility]  Between 7 am - 7 pm I am available, please contact me via Amion (for emergencies) or Securechat (non urgent messages) Between 7 pm - 7 am I am not available, please contact night coverage on amion

## 2022-03-24 NOTE — Plan of Care (Signed)
  Problem: Coping: Goal: Ability to adjust to condition or change in health will improve Outcome: Progressing   Problem: Fluid Volume: Goal: Ability to maintain a balanced intake and output will improve Outcome: Progressing   Problem: Metabolic: Goal: Ability to maintain appropriate glucose levels will improve Outcome: Progressing   Problem: Nutritional: Goal: Maintenance of adequate nutrition will improve Outcome: Progressing

## 2022-03-24 NOTE — TOC Progression Note (Addendum)
Transition of Care [facility]) - Progression Note    Patient Details  Name: Jacob Barnes MRN: [id number] Date of Birth: 01/26/1941  Transition of Care [facility]) CM/SW Contact  [name], RN Phone Number: 03/24/2022, 1:44 PM  Clinical Narrative:    CM provided bed offers to patients spouse. She selected [facility] will update [facility] and ask the [facility] MOA to begin insurance authorization. CM received notification that pts sister wanted to be in the loop on d/c planning. CM has asked pt's spouse to update her on the d/c plan. Wife to talk with sister.  TOC following.  1350: [facility] admissions, Darol Destine has verified they have a bed for patient once Josem Kaufmann is approved.   Expected Discharge Plan: [place] Barriers to Discharge: Continued Medical Work up  Expected Discharge Plan and Services Expected Discharge Plan: [place] In-house Referral: Clinical Social Work Discharge Planning Services: CM Consult Post Acute Care Choice: [place] arrangements for the past 2 months: Single Family Home                                       Social Determinants of Health (SDOH) Interventions    Readmission Risk Interventions     No data to display

## 2022-03-25 DIAGNOSIS — S065XAA Traumatic subdural hemorrhage with loss of consciousness status unknown, initial encounter: Secondary | ICD-10-CM | POA: Diagnosis not present

## 2022-03-25 LAB — GLUCOSE, CAPILLARY
Glucose-Capillary: 150 mg/dL — ABNORMAL HIGH (ref 70–99)
Glucose-Capillary: 205 mg/dL — ABNORMAL HIGH (ref 70–99)

## 2022-03-25 NOTE — Progress Notes (Signed)
PROGRESS NOTE  Jacob Barnes:[id number] DOB: 1940/06/24 DOA: 03/19/2022 PCP: Fayrene Helper, MD   LOS: 6 days   Brief Narrative / Interim history: 81 year old male with history of mild dementia, DM2, HTN, CKD 3A with baseline creatinine 1.1-1.3, comes into the hospital with increased confusion, poor coordination per family.  He initially presented to [facility] ED, found to have a subdural hematoma, and then transferred to [facility] for neurosurgery evaluation.  Patient subdural hematoma appears of stabilized, status post middle meningeal artery embolization on the 13th otherwise stable and for discharge per neurosurgery.  Currently awaiting safe disposition at nursing facility/rehab.  Subjective / 24h Interval events: No acute issues or events overnight denies nausea vomiting diarrhea constipation fevers chills or chest pain.  Patient does report bilateral frontal headache improved with Tylenol.  Assesement and Plan: Principal Problem:   Subdural hematoma (HCC) Active Problems:   Acute hypoxic respiratory failure (HCC)   Essential hypertension, benign   CKD (chronic kidney disease) stage 3, GFR 30-59 ml/min (HCC)   Uncontrolled type 2 diabetes mellitus with hyperglycemia (HCC)   Dementia (HCC)  Subdural hematoma  -Without reported trauma -CT scan on admission showed left cerebral subdural hematoma with mass effect 4 - 5 millimeters rightward midline shift.  Repeat CT scan shows stability.   -Neurosurgery following, successful Onyx embolization of left middle meningeal artery 12/13 -Well-controlled on amlodipine  Acute metabolic encephalopathy-improving likely due to above.  Urinalysis not suggestive of UTI, chest x-ray without evidence of significant infection.  Hypoxemia-resolved, transient episode - now on room air  Mild dementia-apparently was quite functional prior to this presentation, able to take care of himself, performing all his ADLs, driving  independently  DM2-uncontrolled, with hyperglycemia.  Placed on sliding scale, hypoglycemic protocol  CBG (last 3)   Acute kidney injury, resolved Patient carries diagnosis of CKD 3A in the chart however his baseline GFR is >60.  Would have patient follow-up with PCP and possibly nephrology for further discussion on this diagnosis  Hypokalemia-repleted -follow repeat labs  Essential hypertension-blood pressure controlled, start amlodipine  Chronic anemia-normocytic, hemoglobin stable, no bleeding  Scheduled Meds:  amLODipine  5 mg Oral Daily   insulin aspart  0-9 Units Subcutaneous TID AC & HS   Continuous Infusions:  levETIRAcetam 500 mg (03/25/22 0534)   PRN Meds:.acetaminophen **OR** acetaminophen, hydrALAZINE, LORazepam, ondansetron **OR** ondansetron (ZOFRAN) IV  Current Outpatient Medications  Medication Instructions   benazepril (LOTENSIN) 20 mg, Oral, Daily   CALCIUM PO 1 tablet, Oral, 3 times weekly   Cholecalciferol (VITAMIN D3 PO) 1 tablet, Oral, 3 times weekly   diphenoxylate-atropine (LOMOTIL) 2.5-0.025 MG tablet TAKE ONE TABLET BY MOUTH THREE TIMES WEEKLY AS NEEDED, FOR LOOSE STOOL   donepezil (ARICEPT) 10 mg, Oral, Daily at bedtime   dorzolamide-timolol (COSOPT) 22.3-6.8 MG/ML ophthalmic solution 1 drop, 3 times weekly   Insulin Pen Needle (PEN NEEDLES) 31G X 6 MM MISC 1 each, Does not apply, Daily at bedtime   Iron, Ferrous Sulfate, 325 (65 Fe) MG TABS Take one tablet by mouth once daily   latanoprost (XALATAN) 0.005 % ophthalmic solution 1 drop, 3 times weekly   linagliptin (TRADJENTA) 5 mg, Oral, Daily with breakfast   pantoprazole (PROTONIX) 40 MG tablet TAKE 1 TABLET BY MOUTH EVERY DAY   rosuvastatin (CRESTOR) 5 MG tablet Take one tablet by mouth every Monday , Wednesday and Friday   Tresiba FlexTouch 5 Units, Subcutaneous, Daily at bedtime    Diet Orders (From admission, onward)  Start     Ordered   03/24/22 1020  Diet regular Room service  appropriate? Yes; Fluid consistency: Thin  Diet effective now       Comments: No beef, pork or lamb.  Pt and family said that he has allergies to these.  Question Answer Comment  Room service appropriate? Yes   Fluid consistency: Thin      03/24/22 1020            DVT prophylaxis: SCDs Start: 03/19/22 2116   Lab Results  Component Value Date   PLT 97 (L) 03/21/2022      Code Status: Full Code  Family Communication: Sister at bedside Status is: Inpatient Remains inpatient appropriate because: severity of illness  Level of care: Progressive  Consultants:  Neurosurgery   Objective: Vitals:   03/24/22 1615 03/24/22 2000 03/25/22 0000 03/25/22 0400  BP: 128/72 106/63 114/68 120/70  Pulse: 66 72 70 69  Resp: '14 16 16 16  '$ Temp: 98.3 F (36.8 C) 98.7 F (37.1 C) 98.6 F (37 C) 98.4 F (36.9 C)  TempSrc: Oral Oral Oral Oral  SpO2: 100% 100% 100% 100%  Weight:      Height:        Intake/Output Summary (Last 24 hours) at 03/25/2022 0725 Last data filed at 03/24/2022 1751 Gross per 24 hour  Intake 630 ml  Output 525 ml  Net 105 ml    Wt Readings from Last 3 Encounters:  03/22/22 52.2 kg  03/13/22 52.1 kg  02/16/22 51.3 kg    Examination:  General:  Pleasantly resting in bed, No acute distress. HEENT:  Normocephalic atraumatic.  Sclerae nonicteric, noninjected.  Extraocular movements intact bilaterally. Neck:  Without mass or deformity.  Trachea is midline. Lungs:  Clear to auscultate bilaterally without rhonchi, wheeze, or rales. Heart:  Regular rate and rhythm.  Without murmurs, rubs, or gallops. Abdomen:  Soft, nontender, nondistended.  Without guarding or rebound. Extremities: Without cyanosis, clubbing, edema, or obvious deformity. Vascular:  Dorsalis pedis and posterior tibial pulses palpable bilaterally.  Data Reviewed: I have independently reviewed following labs and imaging studies   CBC Recent Labs  Lab 03/19/22 1615 03/19/22 1652  03/20/22 0532 03/21/22 0243  WBC 7.1  --  9.8 7.2  HGB 9.9* 10.5* 10.3* 10.1*  HCT 30.9* 31.0* 31.9* 31.1*  PLT 112*  --  106* 97*  MCV 93.4  --  93.3 91.7  MCH 29.9  --  30.1 29.8  MCHC 32.0  --  32.3 32.5  RDW 16.1*  --  16.1* 15.9*  LYMPHSABS 1.8  --   --   --   MONOABS 1.6*  --   --   --   EOSABS 0.0  --   --   --   BASOSABS 0.0  --   --   --      Recent Labs  Lab 03/19/22 1615 03/19/22 1652 03/20/22 0532 03/21/22 0243  NA 136 140 138 138  K 3.6 3.7 3.4* 3.4*  CL 100 98 108 104  CO2 28  --  28 27  GLUCOSE 375* 378* 122* 125*  BUN '18 16 13 9  '$ CREATININE 1.52* 1.60* 1.14 1.03  CALCIUM 8.3*  --  8.0* 8.1*  AST 25  --   --   --   ALT 16  --   --   --   ALKPHOS 101  --   --   --   BILITOT 0.4  --   --   --  ALBUMIN 3.5  --   --   --   INR 1.1  --   --   --      ------------------------------------------------------------------------------------------------------------------ No results for input(s): "CHOL", "HDL", "LDLCALC", "TRIG", "CHOLHDL", "LDLDIRECT" in the last 72 hours.  Lab Results  Component Value Date   HGBA1C 9.1 (A) 01/26/2022   ------------------------------------------------------------------------------------------------------------------ No results for input(s): "TSH", "T4TOTAL", "T3FREE", "THYROIDAB" in the last 72 hours.  Invalid input(s): "FREET3"  Cardiac Enzymes No results for input(s): "CKMB", "TROPONINI", "MYOGLOBIN" in the last 168 hours.  Invalid input(s): "CK" ------------------------------------------------------------------------------------------------------------------ No results found for: "BNP"  CBG: Recent Labs  Lab 03/23/22 1941 03/24/22 0603 03/24/22 1129 03/24/22 1532 03/25/22 0647  GLUCAP 302* 134* 248* 259* 150*     Recent Results (from the past 240 hour(s))  Surgical pcr screen     Status: None   Collection Time: 03/22/22  3:31 AM   Specimen: Nasal Mucosa; Nasal Swab  Result Value Ref Range Status    MRSA, PCR NEGATIVE NEGATIVE Final   Staphylococcus aureus NEGATIVE NEGATIVE Final    Comment: (NOTE) The Xpert SA Assay (FDA approved for NASAL specimens in patients 82 years of age and older), is one component of a comprehensive surveillance program. It is not intended to diagnose infection nor to guide or monitor treatment. Performed at [facility], [place] [address] [place], [place] [address]      Radiology Studies: No results found.   Holli Humbles DO [facility]  Between 7 am - 7 pm I am available, please contact me via Amion (for emergencies) or Securechat (non urgent messages) Between 7 pm - 7 am I am not available, please contact night coverage on amion

## 2022-03-26 ENCOUNTER — Encounter (HOSPITAL_COMMUNITY): Payer: Self-pay

## 2022-03-26 DIAGNOSIS — S065XAA Traumatic subdural hemorrhage with loss of consciousness status unknown, initial encounter: Secondary | ICD-10-CM | POA: Diagnosis not present

## 2022-03-26 HISTORY — PX: IR ANGIO INTRA EXTRACRAN SEL INTERNAL CAROTID UNI L MOD SED: IMG5361

## 2022-03-26 HISTORY — PX: IR TRANSCATH/EMBOLIZ: IMG695

## 2022-03-26 HISTORY — PX: IR NEURO EACH ADD'L AFTER BASIC UNI LEFT (MS): IMG5373

## 2022-03-26 HISTORY — PX: IR ANGIOGRAM FOLLOW UP STUDY: IMG697

## 2022-03-26 LAB — GLUCOSE, CAPILLARY
Glucose-Capillary: 119 mg/dL — ABNORMAL HIGH (ref 70–99)
Glucose-Capillary: 296 mg/dL — ABNORMAL HIGH (ref 70–99)
Glucose-Capillary: 303 mg/dL — ABNORMAL HIGH (ref 70–99)
Glucose-Capillary: 201 mg/dL — ABNORMAL HIGH (ref 70–99)

## 2022-03-26 NOTE — Progress Notes (Signed)
Mobility Specialist Progress Note   03/26/22 1300  Mobility  Activity Ambulated with assistance in room;Ambulated with assistance to bathroom (in recliner before and after)  Level of Assistance Contact guard assist, steadying assist  Assistive Device Front wheel walker  Distance Ambulated (ft) 25 ft  Range of Motion/Exercises Active;All extremities  Activity Response Tolerated well   Patient received in recliner requesting assistance to restroom. Ambulated to and from restroom with slow steady gait. Tolerated without complaint or incident. Was left in recliner with all needs met, call bell in reach.   [place] Rommie Dunn, BS EXP Mobility Specialist Please contact via SecureChat or Rehab office at [phone]

## 2022-03-26 NOTE — Progress Notes (Signed)
PROGRESS NOTE  Jacob Barnes QVZ:[id number] DOB: Aug 25, 1940 DOA: 03/19/2022 PCP: Fayrene Helper, MD   LOS: 7 days   Brief Narrative / Interim history: 81 year old male with history of mild dementia, DM2, HTN, CKD 3A with baseline creatinine 1.1-1.3, comes into the hospital with increased confusion, poor coordination per family.  He initially presented to [facility] ED, found to have a subdural hematoma, and then transferred to [facility] for neurosurgery evaluation.  Patient subdural hematoma appears of stabilized, status post middle meningeal artery embolization on the 13th otherwise stable and for discharge per neurosurgery.  Currently awaiting safe disposition at nursing facility/rehab.  Subjective / 24h Interval events: No acute issues or events overnight denies nausea vomiting diarrhea constipation fevers chills or chest pain.  Patient does report bilateral frontal headache improved with Tylenol.  Assesement and Plan: Principal Problem:   Subdural hematoma (HCC) Active Problems:   Acute hypoxic respiratory failure (HCC)   Essential hypertension, benign   CKD (chronic kidney disease) stage 3, GFR 30-59 ml/min (HCC)   Uncontrolled type 2 diabetes mellitus with hyperglycemia (HCC)   Dementia (HCC)  Subdural hematoma  -Without reported trauma -CT scan on admission showed left cerebral subdural hematoma with mass effect 4 - 5 millimeters rightward midline shift.  Repeat CT scan shows stability.   -Neurosurgery following, successful Onyx embolization of left middle meningeal artery 12/13 -Well-controlled on amlodipine  Acute metabolic encephalopathy-improving likely due to above.  Urinalysis not suggestive of UTI, chest x-ray without evidence of significant infection.  Hypoxemia-resolved, transient episode - now on room air  Mild dementia-apparently was quite functional prior to this presentation, able to take care of himself, performing all his ADLs, driving  independently  DM2-uncontrolled, with hyperglycemia.  A1c 9.3 in the past few weeks.  Placed on sliding scale, hypoglycemic protocol  CBG (last 3)   Acute kidney injury, resolved Patient carries diagnosis of CKD 3A in the chart however his baseline GFR is >60.  Would have patient follow-up with PCP and possibly nephrology for further discussion on this diagnosis  Hypokalemia-repleted -follow repeat labs  Essential hypertension-blood pressure controlled, start amlodipine  Chronic anemia-normocytic, hemoglobin stable, no bleeding  Scheduled Meds:  amLODipine  5 mg Oral Daily   insulin aspart  0-9 Units Subcutaneous TID AC & HS   Continuous Infusions:  levETIRAcetam 500 mg (03/26/22 0358)   PRN Meds:.acetaminophen **OR** acetaminophen, hydrALAZINE, LORazepam, ondansetron **OR** ondansetron (ZOFRAN) IV  Current Outpatient Medications  Medication Instructions   benazepril (LOTENSIN) 20 mg, Oral, Daily   CALCIUM PO 1 tablet, Oral, 3 times weekly   Cholecalciferol (VITAMIN D3 PO) 1 tablet, Oral, 3 times weekly   diphenoxylate-atropine (LOMOTIL) 2.5-0.025 MG tablet TAKE ONE TABLET BY MOUTH THREE TIMES WEEKLY AS NEEDED, FOR LOOSE STOOL   donepezil (ARICEPT) 10 mg, Oral, Daily at bedtime   dorzolamide-timolol (COSOPT) 22.3-6.8 MG/ML ophthalmic solution 1 drop, 3 times weekly   Insulin Pen Needle (PEN NEEDLES) 31G X 6 MM MISC 1 each, Does not apply, Daily at bedtime   Iron, Ferrous Sulfate, 325 (65 Fe) MG TABS Take one tablet by mouth once daily   latanoprost (XALATAN) 0.005 % ophthalmic solution 1 drop, 3 times weekly   linagliptin (TRADJENTA) 5 mg, Oral, Daily with breakfast   pantoprazole (PROTONIX) 40 MG tablet TAKE 1 TABLET BY MOUTH EVERY DAY   rosuvastatin (CRESTOR) 5 MG tablet Take one tablet by mouth every Monday , Wednesday and Friday   Tresiba FlexTouch 5 Units, Subcutaneous, Daily at bedtime  Diet Orders (From admission, onward)     Start     Ordered   03/25/22 0928   Diet regular Room service appropriate? No; Fluid consistency: Thin  Diet effective now       Comments: No beef, pork or lamb.  Pt and family said that he has allergies to these.  Question Answer Comment  Room service appropriate? No   Fluid consistency: Thin      03/25/22 0928            DVT prophylaxis: SCDs Start: 03/19/22 2116   Lab Results  Component Value Date   PLT 97 (L) 03/21/2022      Code Status: Full Code  Family Communication: Daughter at bedside Status is: Inpatient -medically stable for discharge Remains inpatient appropriate because: severity of illness, ongoing ambulatory dysfunction and need for further monitoring and treatment with physical therapy  Level of care: Progressive  Consultants:  Neurosurgery   Objective: Vitals:   03/25/22 1551 03/25/22 2000 03/26/22 0000 03/26/22 0400  BP: 119/70 127/60 120/72 122/74  Pulse: (!) 59 71 74 75  Resp: '17 18 18 18  '$ Temp: (!) 97.5 F (36.4 C) 97.9 F (36.6 C) 98 F (36.7 C) 97.9 F (36.6 C)  TempSrc: Oral Oral Oral Oral  SpO2: 98% 100% 99% 100%  Weight:      Height:        Intake/Output Summary (Last 24 hours) at 03/26/2022 0757 Last data filed at 03/25/2022 1800 Gross per 24 hour  Intake 880 ml  Output --  Net 880 ml    Wt Readings from Last 3 Encounters:  03/22/22 52.2 kg  03/13/22 52.1 kg  02/16/22 51.3 kg    Examination:  General:  Pleasantly resting in bed, No acute distress. HEENT:  Normocephalic atraumatic.  Sclerae nonicteric, noninjected.  Extraocular movements intact bilaterally. Neck:  Without mass or deformity.  Trachea is midline. Lungs:  Clear to auscultate bilaterally without rhonchi, wheeze, or rales. Heart:  Regular rate and rhythm.  Without murmurs, rubs, or gallops. Abdomen:  Soft, nontender, nondistended.  Without guarding or rebound. Extremities: Without cyanosis, clubbing, edema, or obvious deformity. Vascular:  Dorsalis pedis and posterior tibial pulses palpable  bilaterally. Skin:  Warm and dry, no erythema, no ulcerations.  Data Reviewed: I have independently reviewed following labs and imaging studies   CBC Recent Labs  Lab 03/19/22 1615 03/19/22 1652 03/20/22 0532 03/21/22 0243  WBC 7.1  --  9.8 7.2  HGB 9.9* 10.5* 10.3* 10.1*  HCT 30.9* 31.0* 31.9* 31.1*  PLT 112*  --  106* 97*  MCV 93.4  --  93.3 91.7  MCH 29.9  --  30.1 29.8  MCHC 32.0  --  32.3 32.5  RDW 16.1*  --  16.1* 15.9*  LYMPHSABS 1.8  --   --   --   MONOABS 1.6*  --   --   --   EOSABS 0.0  --   --   --   BASOSABS 0.0  --   --   --      Recent Labs  Lab 03/19/22 1615 03/19/22 1652 03/20/22 0532 03/21/22 0243  NA 136 140 138 138  K 3.6 3.7 3.4* 3.4*  CL 100 98 108 104  CO2 28  --  28 27  GLUCOSE 375* 378* 122* 125*  BUN '18 16 13 9  '$ CREATININE 1.52* 1.60* 1.14 1.03  CALCIUM 8.3*  --  8.0* 8.1*  AST 25  --   --   --  ALT 16  --   --   --   ALKPHOS 101  --   --   --   BILITOT 0.4  --   --   --   ALBUMIN 3.5  --   --   --   INR 1.1  --   --   --     CBG: Recent Labs  Lab 03/24/22 1129 03/24/22 1532 03/25/22 0647 03/25/22 2222 03/26/22 0654  GLUCAP 248* 259* 150* 205* 119*     Recent Results (from the past 240 hour(s))  Surgical pcr screen     Status: None   Collection Time: 03/22/22  3:31 AM   Specimen: Nasal Mucosa; Nasal Swab  Result Value Ref Range Status   MRSA, PCR NEGATIVE NEGATIVE Final   Staphylococcus aureus NEGATIVE NEGATIVE Final    Comment: (NOTE) The Xpert SA Assay (FDA approved for NASAL specimens in patients 57 years of age and older), is one component of a comprehensive surveillance program. It is not intended to diagnose infection nor to guide or monitor treatment. Performed at [facility], [place] [address] [place], [place] [address]      Radiology Studies: No results found.   Holli Humbles DO [facility]  Between 7 am - 7 pm I am available, please contact me via Amion (for emergencies) or  Securechat (non urgent messages) Between 7 pm - 7 am I am not available, please contact night coverage on amion

## 2022-03-27 DIAGNOSIS — S065XAA Traumatic subdural hemorrhage with loss of consciousness status unknown, initial encounter: Secondary | ICD-10-CM | POA: Diagnosis not present

## 2022-03-27 LAB — GLUCOSE, CAPILLARY
Glucose-Capillary: 215 mg/dL — ABNORMAL HIGH (ref 70–99)
Glucose-Capillary: 212 mg/dL — ABNORMAL HIGH (ref 70–99)
Glucose-Capillary: 297 mg/dL — ABNORMAL HIGH (ref 70–99)
Glucose-Capillary: 107 mg/dL — ABNORMAL HIGH (ref 70–99)
Glucose-Capillary: 114 mg/dL — ABNORMAL HIGH (ref 70–99)
Glucose-Capillary: 309 mg/dL — ABNORMAL HIGH (ref 70–99)
Glucose-Capillary: 235 mg/dL — ABNORMAL HIGH (ref 70–99)
Glucose-Capillary: 272 mg/dL — ABNORMAL HIGH (ref 70–99)

## 2022-03-27 MED ORDER — INSULIN GLARGINE-YFGN 100 UNIT/ML ~~LOC~~ SOLN
5.0000 [IU] | Freq: Every day | SUBCUTANEOUS | Status: DC
  Administered 2022-03-27: 5 [IU] via SUBCUTANEOUS
  Filled 2022-03-27 (×2): qty 0.05

## 2022-03-27 NOTE — Progress Notes (Signed)
Inpatient Diabetes Program Recommendations  AACE/ADA: New Consensus Statement on Inpatient Glycemic Control (2015)  Target Ranges:  Prepandial:   less than 140 mg/dL      Peak postprandial:   less than 180 mg/dL (1-2 hours)      Critically ill patients:  140 - 180 mg/dL   Lab Results  Component Value Date   GLUCAP 309 (H) 03/27/2022   HGBA1C 9.1 (A) 01/26/2022    Review of Glycemic Control  Latest Reference Range & Units 03/26/22 06:54 03/26/22 12:28 03/26/22 16:33 03/26/22 22:04 03/27/22 06:28 03/27/22 11:44  Glucose-Capillary 70 - 99 mg/dL 119 (H) 296 (H) 303 (H) 201 (H) 114 (H) 309 (H)   Diabetes history: DM 2 Outpatient Diabetes medications:  Tresiba 5 units daily Tradjenta 5 mg daily  Current orders for Inpatient glycemic control:  Novolog sensitive tid with meals and HS Semglee 5 units daily  Inpatient Diabetes Program Recommendations:    Consider changing HS Novolog coverage to (0-5 units) bedtime coverage.  Also consider adding Novolog meal coverage 2 units tid with meals (hold if patient eats less than 50% or NPO).  Thanks,  Adah Perl, RN, BC-ADM Inpatient Diabetes Coordinator Pager [phone]  (8a-5p)

## 2022-03-27 NOTE — TOC Progression Note (Signed)
Transition of Care [facility]) - Progression Note    Patient Details  Name: HAYES CZAJA MRN: [id number] Date of Birth: October 05, 1940  Transition of Care [facility]) CM/SW Contact  [name], RN Phone Number: 03/27/2022, 3:55 PM  Clinical Narrative:    Pt's authorization for [facility] is still pending.  TOC following   Expected Discharge Plan: [place] Barriers to Discharge: Continued Medical Work up  Expected Discharge Plan and Services Expected Discharge Plan: [place] In-house Referral: Clinical Social Work Discharge Planning Services: CM Consult Post Acute Care Choice: [place] arrangements for the past 2 months: Single Family Home                                       Social Determinants of Health (SDOH) Interventions    Readmission Risk Interventions     No data to display

## 2022-03-27 NOTE — Progress Notes (Signed)
PT Cancellation Note  Patient Details Name: SEABRON IANNELLO MRN: [id number] DOB: Mar 08, 1941   Cancelled Treatment:    Reason Eval/Treat Not Completed: (P) Other (comment) (pt declining as having recenelty worked with mobility team) Will check back as schedule allows to continue with PT POC.  Audry Riles. PTA Acute Rehabilitation Services Office: [place] 03/27/2022, 4:22 PM

## 2022-03-27 NOTE — Progress Notes (Signed)
Mobility Specialist: Progress Note   03/27/22 1458  Mobility  Activity Ambulated with assistance in hallway  Level of Assistance Contact guard assist, steadying assist  Assistive Device Front wheel walker  Distance Ambulated (ft) 500 ft  Activity Response Tolerated well  Mobility Referral Yes  $Mobility charge 1 Mobility   Post-Mobility: 88 HR, 130/62 (83) BP, 99% SpO2  Received pt in chair having no complaints and agreeable to mobility. Pt was asymptomatic throughout ambulation and returned to room w/o fault. Left in chair w/ call bell in reach and all needs met.  [place] Talina Pleitez Mobility Specialist Please contact via SecureChat or Rehab office at [phone]

## 2022-03-27 NOTE — Progress Notes (Signed)
PROGRESS NOTE  Jacob Barnes GYI:[id number] DOB: May 10, 1940 DOA: 03/19/2022 PCP: Fayrene Helper, MD   LOS: 8 days   Brief Narrative / Interim history: 81 year old male with history of mild dementia, DM2, HTN, CKD 3A with baseline creatinine 1.1-1.3, comes into the hospital with increased confusion, poor coordination per family.  He initially presented to [facility], found to have a subdural hematoma, and then transferred to [facility] for neurosurgery evaluation.  Patient subdural hematoma appears of stabilized, status post middle meningeal artery embolization on the 13th otherwise stable and for discharge per neurosurgery.  Currently awaiting safe disposition at nursing facility/rehab.  Subjective / 24h Interval events: No acute issues or events overnight  Assesement and Plan: Principal Problem:   Subdural hematoma (HCC) Active Problems:   Acute hypoxic respiratory failure (HCC)   Essential hypertension, benign   CKD (chronic kidney disease) stage 3, GFR 30-59 ml/min (HCC)   Uncontrolled type 2 diabetes mellitus with hyperglycemia (HCC)   Dementia (HCC)  Subdural hematoma  -Without reported trauma -CT scan on admission showed left cerebral subdural hematoma with mass effect 4 - 5 millimeters rightward midline shift.  Repeat CT scan shows stability.   -Neurosurgery following, successful Onyx embolization of left middle meningeal artery 12/13 -Well-controlled on amlodipine  Acute metabolic encephalopathy-improving likely due to above.  Urinalysis not suggestive of UTI, chest x-ray without evidence of significant infection.  Hypoxemia-resolved, transient episode - now on room air  Mild dementia-apparently was quite functional prior to this presentation, able to take care of himself, performing all his ADLs, driving independently  DM2-uncontrolled, with hyperglycemia.  A1c 9.3 in the past few weeks.  Placed on sliding scale, hypoglycemic protocol; start glargine insulin 5u daily and  titrate.  Acute kidney injury, resolved Patient carries diagnosis of CKD 3A in the chart however his baseline GFR is >60.  Would have patient follow-up with PCP and possibly nephrology for further discussion on this diagnosis  Hypokalemia-repleted -follow repeat labs  Essential hypertension-blood pressure controlled, start amlodipine  Chronic anemia-normocytic, hemoglobin stable, no bleeding  Scheduled Meds:  amLODipine  5 mg Oral Daily   insulin aspart  0-9 Units Subcutaneous TID AC & HS   Continuous Infusions:  levETIRAcetam 500 mg (03/27/22 0316)   PRN Meds:.acetaminophen **OR** acetaminophen, hydrALAZINE, LORazepam, ondansetron **OR** ondansetron (ZOFRAN) IV  Current Outpatient Medications  Medication Instructions   benazepril (LOTENSIN) 20 mg, Oral, Daily   CALCIUM PO 1 tablet, Oral, 3 times weekly   Cholecalciferol (VITAMIN D3 PO) 1 tablet, Oral, 3 times weekly   diphenoxylate-atropine (LOMOTIL) 2.5-0.025 MG tablet TAKE ONE TABLET BY MOUTH THREE TIMES WEEKLY AS NEEDED, FOR LOOSE STOOL   donepezil (ARICEPT) 10 mg, Oral, Daily at bedtime   dorzolamide-timolol (COSOPT) 22.3-6.8 MG/ML ophthalmic solution 1 drop, 3 times weekly   Insulin Pen Needle (PEN NEEDLES) 31G X 6 MM MISC 1 each, Does not apply, Daily at bedtime   Iron, Ferrous Sulfate, 325 (65 Fe) MG TABS Take one tablet by mouth once daily   latanoprost (XALATAN) 0.005 % ophthalmic solution 1 drop, 3 times weekly   linagliptin (TRADJENTA) 5 mg, Oral, Daily with breakfast   pantoprazole (PROTONIX) 40 MG tablet TAKE 1 TABLET BY MOUTH EVERY DAY   rosuvastatin (CRESTOR) 5 MG tablet Take one tablet by mouth every Monday , Wednesday and Friday   Tresiba FlexTouch 5 Units, Subcutaneous, Daily at bedtime    Diet Orders (From admission, onward)     Start     Ordered   03/25/22  0928  Diet regular Room service appropriate? No; Fluid consistency: Thin  Diet effective now       Comments: No beef, pork or lamb.  Pt and family said  that he has allergies to these.  Question Answer Comment  Room service appropriate? No   Fluid consistency: Thin      03/25/22 0928            DVT prophylaxis: SCDs Start: 03/19/22 2116   Lab Results  Component Value Date   PLT 97 (L) 03/21/2022      Code Status: Full Code  Family Communication: Daughter at bedside Status is: Inpatient -medically stable for discharge Remains inpatient appropriate because: severity of illness, ongoing ambulatory dysfunction and need for further monitoring and treatment with physical therapy  Level of care: Progressive  Consultants:  Neurosurgery   Objective: Vitals:   03/26/22 1635 03/26/22 2007 03/26/22 2339 03/27/22 0405  BP: 102/62 97/61 (!) 112/58 119/67  Pulse: 79 88 81 71  Resp: '16 16  16  '$ Temp: (!) 96.2 F (35.7 C) 99.1 F (37.3 C) 98.9 F (37.2 C) 98.5 F (36.9 C)  TempSrc: Axillary Oral Oral Oral  SpO2: 100% 100% 100% 99%  Weight:      Height:        Intake/Output Summary (Last 24 hours) at 03/27/2022 0732 Last data filed at 03/26/2022 1800 Gross per 24 hour  Intake 820 ml  Output --  Net 820 ml    Wt Readings from Last 3 Encounters:  03/22/22 52.2 kg  03/13/22 52.1 kg  02/16/22 51.3 kg    Examination:  General:  Pleasantly resting in bed, No acute distress. HEENT:  Normocephalic atraumatic.  Sclerae nonicteric, noninjected.  Extraocular movements intact bilaterally. Neck:  Without mass or deformity.  Trachea is midline. Lungs:  Clear to auscultate bilaterally without rhonchi, wheeze, or rales. Heart:  Regular rate and rhythm.  Without murmurs, rubs, or gallops. Abdomen:  Soft, nontender, nondistended.  Without guarding or rebound. Extremities: Without cyanosis, clubbing, edema, or obvious deformity. Vascular:  Dorsalis pedis and posterior tibial pulses palpable bilaterally. Skin:  Warm and dry, no erythema, no ulcerations.  Data Reviewed: I have independently reviewed following labs and imaging  studies   CBC Recent Labs  Lab 03/21/22 0243  WBC 7.2  HGB 10.1*  HCT 31.1*  PLT 97*  MCV 91.7  MCH 29.8  MCHC 32.5  RDW 15.9*     Recent Labs  Lab 03/21/22 0243  NA 138  K 3.4*  CL 104  CO2 27  GLUCOSE 125*  BUN 9  CREATININE 1.03  CALCIUM 8.1*    CBG: Recent Labs  Lab 03/26/22 0654 03/26/22 1228 03/26/22 1633 03/26/22 2204 03/27/22 0628  GLUCAP 119* 296* 303* 201* 114*     Recent Results (from the past 240 hour(s))  Surgical pcr screen     Status: None   Collection Time: 03/22/22  3:31 AM   Specimen: Nasal Mucosa; Nasal Swab  Result Value Ref Range Status   MRSA, PCR NEGATIVE NEGATIVE Final   Staphylococcus aureus NEGATIVE NEGATIVE Final    Comment: (NOTE) The Xpert SA Assay (FDA approved for NASAL specimens in patients 45 years of age and older), is one component of a comprehensive surveillance program. It is not intended to diagnose infection nor to guide or monitor treatment. Performed at [facility], [place] [address], [place], [place] [address]      Radiology Studies: No results found.   Holli Humbles DO  [facility]  Between 7 am - 7 pm I am available, please contact me via Amion (for emergencies) or Securechat (non urgent messages) Between 7 pm - 7 am I am not available, please contact night coverage on amion

## 2022-03-28 DIAGNOSIS — F03A0 Unspecified dementia, mild, without behavioral disturbance, psychotic disturbance, mood disturbance, and anxiety: Secondary | ICD-10-CM | POA: Diagnosis not present

## 2022-03-28 DIAGNOSIS — S065XAA Traumatic subdural hemorrhage with loss of consciousness status unknown, initial encounter: Secondary | ICD-10-CM | POA: Diagnosis not present

## 2022-03-28 DIAGNOSIS — N183 Chronic kidney disease, stage 3 unspecified: Secondary | ICD-10-CM | POA: Diagnosis not present

## 2022-03-28 LAB — GLUCOSE, CAPILLARY
Glucose-Capillary: 100 mg/dL — ABNORMAL HIGH (ref 70–99)
Glucose-Capillary: 251 mg/dL — ABNORMAL HIGH (ref 70–99)
Glucose-Capillary: 263 mg/dL — ABNORMAL HIGH (ref 70–99)

## 2022-03-28 MED ORDER — LEVETIRACETAM 500 MG PO TABS: 500.0000 mg | ORAL_TABLET | Freq: Two times a day (BID) | ORAL | 1 refills | Status: DC

## 2022-03-28 MED ORDER — INSULIN GLARGINE-YFGN 100 UNIT/ML ~~LOC~~ SOLN
10.0000 [IU] | Freq: Every day | SUBCUTANEOUS | Status: DC
  Administered 2022-03-28: 10 [IU] via SUBCUTANEOUS
  Filled 2022-03-28: qty 0.1

## 2022-03-28 MED ORDER — AMLODIPINE BESYLATE 5 MG PO TABS: 5.0000 mg | ORAL_TABLET | Freq: Every day | ORAL | 0 refills | Status: DC

## 2022-03-28 MED ORDER — TRESIBA FLEXTOUCH 100 UNIT/ML ~~LOC~~ SOPN: 10.0000 [IU] | PEN_INJECTOR | Freq: Every day | SUBCUTANEOUS | 3 refills | Status: DC

## 2022-03-28 NOTE — TOC Transition Note (Signed)
Transition of Care [facility]) - CM/SW Discharge Note   Patient Details  Name: Jacob Barnes MRN: [id number] Date of Birth: July 06, 1940  [facility] CM/SW Contact:  [name], RN Phone Number: 03/28/2022, 1:12 PM   Clinical Narrative:    Pt is discharging to [facility]. He is transporting via [place]. Wife has been updated and bedside RN aware.   Number for report: [phone]   Final next level of care: Skilled Nursing Facility Barriers to Discharge: No Barriers Identified   Patient Goals and CMS Choice   CMS Medicare.gov Compare Post Acute Care list provided to:: Patient Represenative (must comment) Choice offered to / list presented to : Spouse  Discharge Placement              Patient chooses bed at: [facility] [place] Patient to be transferred to facility by: [place] Name of family member notified: Mary--spouse Patient and family notified of of transfer: 03/28/22  Discharge Plan and Services In-house Referral: Clinical Social Work Discharge Planning Services: CM Consult Post Acute Care Choice: [place]                               Social Determinants of Health (SDOH) Interventions     Readmission Risk Interventions     No data to display

## 2022-03-28 NOTE — Progress Notes (Signed)
Occupational Therapy Treatment Patient Details Name: Jacob Barnes MRN: [id number] DOB: 1940-12-06 Today's Date: 03/28/2022   History of present illness Pt is 81 yo male admitted 03/19/22 with subdural hematoma.  Neurosurgery consulted and pt for possible MMA embolization.  Pt with hx including but not limited to mild dementia, DM2, HTN, CKD, DM, and arthritis   OT comments  Pt mod A for bed mobility today requiring multimodal cues for sequencing. He was eager to get up and use the bathroom, min guard assist for transfers and in room mobility. Max A for rear peri care, and able to complete standing grooming with cues. Pt very pleasant and cooperative and walking with PT at end of OT session. OT will continue to follow acute and Pt continues to require SNF post-acute to maximize safety and independence in ADL and functional transfers.    Recommendations for follow up therapy are one component of a multi-disciplinary discharge planning process, led by the attending physician.  Recommendations may be updated based on patient status, additional functional criteria and insurance authorization.    Follow Up Recommendations  Skilled nursing-short term rehab (<3 hours/day)     Assistance Recommended at Discharge Frequent or constant Supervision/Assistance  Patient can return home with the following  A little help with walking and/or transfers;A little help with bathing/dressing/bathroom;Assistance with cooking/housework;Direct supervision/assist for medications management;Direct supervision/assist for financial management;Assist for transportation;Help with stairs or ramp for entrance;Assistance with feeding   Equipment Recommendations  Other (comment) (defer to next venue of care)    Recommendations for Other Services      Precautions / Restrictions Precautions Precautions: Fall;Other (comment) Precaution Comments: systolic BP <030 Restrictions Weight Bearing Restrictions: No        Mobility Bed Mobility Overal bed mobility: Needs Assistance Bed Mobility: Supine to Sit     Supine to sit: Mod assist     General bed mobility comments: trunk elevation and coordination to bring hips EOB with bed pad    Transfers Overall transfer level: Needs assistance Equipment used: Rolling walker (2 wheels) Transfers: Sit to/from Stand Sit to Stand: Min assist           General transfer comment: pt tries to pull up on RW with posterior imbalance; vc for safe hand placement     Balance Overall balance assessment: Needs assistance Sitting-balance support: No upper extremity supported Sitting balance-Leahy Scale: Good   Postural control: Posterior lean Standing balance support: No upper extremity supported, During functional activity Standing balance-Leahy Scale: Fair Standing balance comment: Pt continues with posterior lean with standing requiring contact guard assist in order to maintain upright balance intermittently with and without UE support.                           ADL either performed or assessed with clinical judgement   ADL Overall ADL's : Needs assistance/impaired     Grooming: Wash/dry hands;Min guard;Standing Grooming Details (indicate cue type and reason): Min gaurd A for safety; mod cues for locating automatic soap despenser Upper Body Bathing: Moderate assistance Upper Body Bathing Details (indicate cue type and reason): for back     Upper Body Dressing : Set up;Sitting Upper Body Dressing Details (indicate cue type and reason): new gown     Toilet Transfer: Min guard;Minimal assistance;Ambulation;Rolling walker (2 wheels);Comfort height toilet Toilet Transfer Details (indicate cue type and reason): Min guard A for safety Toileting- Clothing Manipulation and Hygiene: Maximal assistance;Sit to/from stand [place]  Manipulation Details (indicate cue type and reason): Pt able to stand with grab bars, OT performed rear peri  care in standing     Functional mobility during ADLs: Min guard;Minimal assistance;Rolling walker (2 wheels) General ADL Comments: min guard A for balance with RW    Extremity/Trunk Assessment              Vision       Perception     Praxis      Cognition Arousal/Alertness: Awake/alert Behavior During Therapy: WFL for tasks assessed/performed Overall Cognitive Status: No family/caregiver present to determine baseline cognitive functioning                                 General Comments: Hx of dementia; followed basic commands; Oriented to self, location.        Exercises      Shoulder Instructions       General Comments      Pertinent Vitals/ Pain       Pain Assessment Pain Assessment: No/denies pain  Home Living                                          Prior Functioning/Environment              Frequency  Min 2X/week        Progress Toward Goals  OT Goals(current goals can now be found in the care plan section)  Progress towards OT goals: Progressing toward goals  Acute Rehab OT Goals Patient Stated Goal: get some hot coffee OT Goal Formulation: With patient Time For Goal Achievement: 04/06/22 Potential to Achieve Goals: Good  Plan Discharge plan remains appropriate;Frequency remains appropriate    Co-evaluation                 AM-PAC OT "6 Clicks" Daily Activity     Outcome Measure   Help from another person eating meals?: A Little Help from another person taking care of personal grooming?: A Little Help from another person toileting, which includes using toliet, bedpan, or urinal?: A Little Help from another person bathing (including washing, rinsing, drying)?: A Little Help from another person to put on and taking off regular upper body clothing?: A Little Help from another person to put on and taking off regular lower body clothing?: A Little 6 Click Score: 18    End of Session  Equipment Utilized During Treatment: Gait belt;Rolling walker (2 wheels)  OT Visit Diagnosis: Unsteadiness on feet (R26.81);Other abnormalities of gait and mobility (R26.89);Muscle weakness (generalized) (M62.81);History of falling (Z91.81);Other symptoms and signs involving cognitive function   Activity Tolerance Patient tolerated treatment well   Patient Left Other (comment) (walking in hallway with PT)   Nurse Communication Mobility status        Time: 3762-8315 OT Time Calculation (min): 19 min  Charges: OT General Charges $OT Visit: 1 Visit OT Treatments $Self Care/Home Management : 8-22 mins  Jesse Sans OTR/L Acute Rehabilitation Services Office: [place] 03/28/2022, 12:24 PM

## 2022-03-28 NOTE — Inpatient Diabetes Management (Addendum)
Inpatient Diabetes Program Recommendations  AACE/ADA: New Consensus Statement on Inpatient Glycemic Control (2015)  Target Ranges:  Prepandial:   less than 140 mg/dL      Peak postprandial:   less than 180 mg/dL (1-2 hours)      Critically ill patients:  140 - 180 mg/dL   Lab Results  Component Value Date   GLUCAP 100 (H) 03/28/2022   HGBA1C 9.1 (A) 01/26/2022    Review of Glycemic Control  Diabetes history: DM 2 Outpatient Diabetes medications:  Tresiba 5 units daily Tradjenta 5 mg daily   Current orders for Inpatient glycemic control:  Novolog sensitive tid with meals and HS Semglee 10 units daily   Inpatient Diabetes Program Recommendations:     Novolog meal coverage 2 units tid with meals (hold if patient eats less than 50% or NPO).  Will continue to follow while inpatient.  Thank you, Reche Dixon, MSN, [place] Diabetes Coordinator Inpatient Diabetes Program [phone] (team pager from 8a-5p)

## 2022-03-28 NOTE — Discharge Summary (Signed)
Physician Discharge Summary  Jacob Barnes DPO:[id number] DOB: [birth year] DOA: 03/19/2022  PCP: Fayrene Helper, MD  Admit date: 03/19/2022 Discharge date: 03/28/2022  Admitted From: Home Disposition: SNF  Recommendations for Outpatient Follow-up:  Follow up with PCP in 1-2 weeks Follow-up with neurology as scheduled  Discharge Condition: Stable CODE STATUS: Full Diet recommendation:    Brief/Interim Summary: Low-salt low-fat low-carb diet -no beef pork or lamb 81 year old male with history of mild dementia, DM2, HTN, CKD 3A with baseline creatinine 1.1-1.3, comes into the hospital with increased confusion, poor coordination per family. He initially presented to [facility] ED, found to have a subdural hematoma, and then transferred to [facility] for neurosurgery evaluation. Patient subdural hematoma appears of stabilized, status post middle meningeal artery embolization on the 13th otherwise stable and for discharge per neurosurgery. Currently awaiting safe disposition at nursing facility/rehab.   Discharge Diagnoses:  Principal Problem:   Subdural hematoma (HCC) Active Problems:   Acute hypoxic respiratory failure (HCC)   Essential hypertension, benign   CKD (chronic kidney disease) stage 3, GFR 30-59 ml/min (HCC)   Uncontrolled type 2 diabetes mellitus with hyperglycemia (HCC)   Dementia (HCC)  Subdural hematoma  -Without reported trauma -CT scan on admission showed left cerebral subdural hematoma with mass effect 4 - 5 millimeters rightward midline shift.  Repeat CT scan stabile.   -Neurosurgery following, successful Onyx embolization of left middle meningeal artery 12/13 -BP well controlled on amlodipine   Ambulatory dysfunction -Secondary to above - PT/OT ongoing - recommending SNF  Acute metabolic encephalopathy Resolved - likely due to above.  Urinalysis not suggestive of UTI, chest x-ray without evidence of significant infection.   Hypoxemia-resolved, transient episode -  now on room air   Mild dementia-apparently was quite functional prior to this presentation, able to take care of himself, performing all his ADLs, driving independently   DM2-uncontrolled, with hyperglycemia.  A1c 9.3 in the past few weeks.  Placed on sliding scale, hypoglycemic protocol; start glargine insulin 5u daily and titrate.   Acute kidney injury, resolved Patient carries diagnosis of CKD 3A in the chart however his baseline GFR is >60.  Would have patient follow-up with PCP and possibly nephrology for further discussion on this diagnosis   Hypokalemia-repleted -follow repeat labs   Essential hypertension-blood pressure controlled, start amlodipine   Chronic anemia-normocytic, hemoglobin stable, no bleeding   Discharge Instructions   Allergies as of 03/28/2022       Reactions   Alpha-gal         Medication List     STOP taking these medications    benazepril 20 MG tablet Commonly known as: LOTENSIN   diphenoxylate-atropine 2.5-0.025 MG tablet Commonly known as: LOMOTIL   donepezil 10 MG tablet Commonly known as: ARICEPT   dorzolamide-timolol 2-0.5 % ophthalmic solution Commonly known as: COSOPT   Iron (Ferrous Sulfate) 325 (65 Fe) MG Tabs   latanoprost 0.005 % ophthalmic solution Commonly known as: XALATAN   pantoprazole 40 MG tablet Commonly known as: PROTONIX       TAKE these medications    amLODipine 5 MG tablet Commonly known as: NORVASC Take 1 tablet (5 mg total) by mouth daily. Start taking on: March 29, 2022   CALCIUM PO Take 1 tablet by mouth 3 (three) times a week.   levETIRAcetam 500 MG tablet Commonly known as: Keppra Take 1 tablet (500 mg total) by mouth 2 (two) times daily.   linagliptin 5 MG Tabs tablet Commonly known as: Tradjenta Take 1  tablet (5 mg total) by mouth daily with breakfast.   Pen Needles 31G X 6 MM Misc 1 each by Does not apply route at bedtime.   rosuvastatin 5 MG tablet Commonly known as:  CRESTOR Take one tablet by mouth every Monday , Wednesday and Friday   Tresiba FlexTouch 100 UNIT/ML FlexTouch Pen Generic drug: insulin degludec Inject 10 Units into the skin at bedtime. What changed: how much to take   VITAMIN D3 PO Take 1 tablet by mouth 3 (three) times a week.        Follow-up Information     Consuella Lose, MD Follow up in 3 week(s).   Specialty: Neurosurgery Contact information: [address] [place] [address] [phone]                Allergies  Allergen Reactions   Alpha-Gal     Consultations: Neuro Surgery  Procedures/Studies: IR ANGIO EXTERNAL CAROTID SEL EXT CAROTID UNI L MOD SED  Result Date: 03/24/2022 PROCEDURE: DIAGNOSTIC CEREBRAL ANGIOGRAM ONYX EMBOLIZATION OF LEFT MIDDLE MENINGEAL ARTERY HISTORY: The patient is a 81 year old man presenting to the hospital with some confusion and mild right-sided weakness. Imaging revealed a relatively large left convexity subdural hematoma. After a lengthy discussion of treatment options, we elected to proceed with middle meningeal artery embolization in order to attempt to avoid open operative evacuation. ACCESS: The technical aspects of the procedure as well as its potential risks and benefits were reviewed with the patient, his wife, and his sister. These risks included but were not limited bleeding, infection, allergic reaction, damage to organs or vital structures, stroke, non-diagnostic procedure, and the catastrophic outcomes of heart attack, coma, and death. With an understanding of these risks, informed consent was obtained and witnessed. The patient was placed in the supine position on the angiography table and the skin of right groin prepped in the usual sterile fashion. The procedure was performed under general anesthesia monitored by the anesthesia service. A 5- French sheath was introduced in the right common femoral artery using Seldinger technique. A fluoro-phase  sequence was used to document the sheath position. MEDICATIONS: HEPARIN: 3000 units total. CONTRAST:  cc, Omnipaque 300 FLUOROSCOPY TIME:  FLUOROSCOPY TIME: See IR records TECHNIQUE: CATHETERS AND WIRES 5-French vert catheter Five French straight Envoy guide catheter Apollo microcatheter [place] 10 microwire 0.035" exchange glidewire VESSELS CATHETERIZED Left internal carotid Left external carotid Left middle meningeal artery Right common femoral VESSELS STUDIED Left internal carotid, head Left external carotid, head Left middle meningeal artery (pre embolization) Left external carotid, head (post embolization) Left internal carotid, head (final control) Right femoral PROCEDURAL NARRATIVE A 5-Fr vert catheter was advanced over a 0.035 glidewire into the aortic arch. The left internal carotid artery was catheterized and angiogram taken. The catheter was then used to selectively catheterize the left external carotid artery and subsequent angiogram was taken. We then elected to proceed with embolization of the middle meningeal artery. The exchange length Glidewire was placed into the distal cervical external carotid artery. The vert catheter was then exchanged for the Envoy guide catheter. Under roadmap guidance, the Apollo microcatheter was navigated into the left middle meningeal artery. Microcatheter run was taken. The catheter was then flushed with DMSO, and the left middle meningeal artery was embolized utilizing onyx 18 under standard blank roadmap technique. After embolization of the artery, the Apollo microcatheter was removed without incident. Post embolization angiogram was taken through the Envoy guide catheter in the left external carotid. The guide catheter  was then advanced into the left internal carotid artery and final control angiogram was taken. After review of the images the catheter was removed without incident. FINDINGS: Left internal carotid, head: Injection reveals the presence of a widely patent  ICA, A1, and M1 segments and their branches. No aneurysms, AVMs, or high-flow fistulas are seen. There is mass effect upon the left frontoparietal convexity presumably from the underlying chronic subdural hematoma. The parenchymal and venous phases are normal. The venous sinuses are widely patent. Left external carotid, head: Visualized distal cervical and cranial branches of the left external carotid artery are unremarkable. Of note, there is no brain perfusion or opacification of the intracranial dural venous sinuses seen from this left-sided external carotid injection. Left middle meningeal artery, head: Microcatheter run taken from the left middle meningeal artery does not reveal any perfusion of the underlying brain. There is contrast blush around the left frontoparietal convexity indicating perfusion of the chronic subdural hematoma membrane. Left external carotid, head (post embolization) Onyx cast is seen within the midportion of the left middle meningeal artery. The middle meningeal artery is occluded at the level of the skull base. No further visualization of the subdural hematoma membrane is identified. The remaining branches of the left external carotid artery are unremarkable. Left internal carotid, head (final control) Injection reveals the presence of a widely patent ICA, A1, and M1 segments and their branches. No aneurysms, AVMs, or high-flow fistulas are seen. Again noted is similar appearance of mass effect upon the left frontoparietal convexity. Capillary phase does not demonstrate any branch occlusions or perfusion deficits. Venous sinuses remain patent. Right femoral: Normal vessel. No significant atherosclerotic disease. Arterial sheath in adequate position. DISPOSITION: Upon completion of the study, the femoral sheath was removed and hemostasis obtained using a 5-Fr ExoSeal closure device. Good proximal and distal lower extremity pulses were documented upon achievement of hemostasis. The  procedure was well tolerated and no early complications were observed. The patient was transferred to the postanesthesia care unit in stable hemodynamic condition. IMPRESSION: 1. Successful onyx embolization of the left middle meningeal artery for chronic subdural hematoma. The preliminary results of this procedure were shared with the patient and the patient's family. Electronically Signed   By: Consuella Lose   On: 03/24/2022 11:37   CT HEAD WO CONTRAST (5MM)  Result Date: 03/20/2022 CLINICAL DATA:  Subdural hematoma EXAM: CT HEAD WITHOUT CONTRAST TECHNIQUE: Contiguous axial images were obtained from the base of the skull through the vertex without intravenous contrast. RADIATION DOSE REDUCTION: This exam was performed according to the departmental dose-optimization program which includes automated exposure control, adjustment of the mA and/or kV according to patient size and/or use of iterative reconstruction technique. COMPARISON:  CT head 03/19/2022. FINDINGS: Brain: When remeasured on the prior, similar size of a left cerebral convexity mixed density subdural hematoma which measures up to 1.6 cm in thickness. Similar versus slightly improved mass effect with 4-5 mm of rightward midline shift. No evidence of acute large vascular territory infarct filled vessel lesion, or hydrocephalus. Similar effacement of the left lateral ventricle. Vascular: No hyperdense vessel. Skull: No acute fracture. Sinuses/Orbits: Clear sinuses.  No acute orbital findings. Other: No mastoid effusions. IMPRESSION: When remeasured on the prior, similar size of a left cerebral convexity mixed density subdural hematoma which measures up to 1.6 cm in thickness. Similar versus slightly improved mass effect with 4-5 mm of rightward midline shift. Electronically Signed   By: Margaretha Sheffield M.D.   On: 03/20/2022 09:18  DG CHEST PORT 1 VIEW  Result Date: 03/19/2022 CLINICAL DATA:  Hypoxia EXAM: PORTABLE CHEST 1 VIEW  COMPARISON:  05/04/2021 FINDINGS: Cardiac shadow is stable. Aortic calcifications are noted. Mild increased interstitial changes are noted consistent with edema. No focal infiltrate or effusion is noted. No bony abnormality is noted. IMPRESSION: Changes consistent with mild parenchymal edema. Electronically Signed   By: Inez Catalina M.D.   On: 03/19/2022 21:01   DG Tibia/Fibula Right  Result Date: 03/19/2022 CLINICAL DATA:  Abrasion, evaluate for trauma, altered mental status EXAM: RIGHT TIBIA AND FIBULA - 2 VIEW COMPARISON:  None Available. FINDINGS: There is no evidence of fracture or other focal bone lesions. Small metallic foreign body adjacent to the mid fibular diaphysis. Soft tissues otherwise unremarkable. IMPRESSION: 1.  No fracture or dislocation of the right tibia or fibula. 2. Small metallic foreign body adjacent to the mid fibular diaphysis, of uncertain nature or chronicity. Electronically Signed   By: Delanna Ahmadi M.D.   On: 03/19/2022 17:30   CT ANGIO HEAD NECK W WO CM W PERF (CODE STROKE)  Result Date: 03/19/2022 CLINICAL DATA:  Neuro deficit, acute, stroke suspected. EXAM: CT ANGIOGRAPHY HEAD AND NECK CT PERFUSION BRAIN TECHNIQUE: Multidetector CT imaging of the head and neck was performed using the standard protocol during bolus administration of intravenous contrast. Multiplanar CT image reconstructions and MIPs were obtained to evaluate the vascular anatomy. Carotid stenosis measurements (when applicable) are obtained utilizing NASCET criteria, using the distal internal carotid diameter as the denominator. Multiphase CT imaging of the brain was performed following IV bolus contrast injection. Subsequent parametric perfusion maps were calculated using RAPID software. RADIATION DOSE REDUCTION: This exam was performed according to the departmental dose-optimization program which includes automated exposure control, adjustment of the mA and/or kV according to patient size and/or use of  iterative reconstruction technique. CONTRAST:  172m OMNIPAQUE IOHEXOL 350 MG/ML SOLN COMPARISON:  Head CT immediately prior FINDINGS: CTA NECK FINDINGS Aortic arch: Minimal atherosclerosis. Branching pattern is normal without flow limiting stenosis. Right carotid system: Common carotid artery widely patent to the bifurcation. Carotid bifurcation is normal without soft or calcified plaque. Cervical ICA is normal. Left carotid system: Common carotid artery widely patent to the bifurcation. Minimal scattered plaque but no stenosis. Soft and calcified plaque at the carotid bifurcation but no stenosis. Cervical ICA widely patent. Vertebral arteries: Both vertebral artery origins are widely patent. The right is dominant. Both vessels are widely patent through the cervical region to the foramen magnum. Skeleton: Ordinary spondylosis. Other neck: No mass or lymphadenopathy. Upper chest: Mild upper lobe scarring.  No active process. Review of the MIP images confirms the above findings CTA HEAD FINDINGS Anterior circulation: Both internal carotid arteries are widely patent through the skull base and siphon regions. Mild siphon atherosclerotic calcification but no stenosis. The anterior and middle cerebral vessels are patent. No large vessel occlusion or proximal stenosis. No aneurysm or vascular malformation. Displacement of the brain and vascular structures on the left because of the left convexity subdural hematoma. No evidence of gross active extravasation. Maximal thickness of the hematoma remains 18 mm. Posterior circulation: Both vertebral arteries patent through the foramen magnum. The left terminates in PICA. The right supplies the basilar artery. Both superior cerebellar and posterior cerebral arteries are patent. Posterior cerebral arteries receive most of there supply from the anterior circulation. Venous sinuses: Patent and normal. Anatomic variants: None significant. Review of the MIP images confirms the above  findings CT Brain Perfusion Findings: ASPECTS:  10 CBF (<30%) Volume: 0 mL, allowing for artifact trip beaded to the subdural hematoma. Perfusion (Tmax>6.0s) volume: 45m, allowing for artifact trip viewed to the subdural hematoma. Mismatch Volume: 077mInfarction Location:None IMPRESSION: 1. No large vessel occlusion or proximal stenosis. 2. No aneurysm or vascular malformation. 3. No evidence of core infarction or penumbra. 4. Large left convexity subdural hematoma with maximal thickness of 18 mm. Displacement of the brain and vascular structures on the left because of the subdural hematoma. No evidence of gross active extravasation. No change in size from the prior CT. 5. These results were communicated to Dr. KiLeonel Ramsayt 5:01 pm on 03/19/2022 by text page via the [facility] system. 6. Aortic atherosclerosis. Aortic Atherosclerosis (ICD10-I70.0). Electronically Signed   By: MaNelson Chimes.D.   On: 03/19/2022 17:01   CT HEAD CODE STROKE WO CONTRAST  Result Date: 03/19/2022 CLINICAL DATA:  Code stroke.  Altered mental status.  Subdural EXAM: CT HEAD WITHOUT CONTRAST TECHNIQUE: Contiguous axial images were obtained from the base of the skull through the vertex without intravenous contrast. RADIATION DOSE REDUCTION: This exam was performed according to the departmental dose-optimization program which includes automated exposure control, adjustment of the mA and/or kV according to patient size and/or use of iterative reconstruction technique. COMPARISON:  01/30/14 CT head FINDINGS: Brain: There is a 1.1 cm mixed density subdural hematoma along the left cerebral convexity. There is approximately 6 mm rightward midline shift. There is effacement of the sulci in the left cerebral hemisphere. There is chronic mineralization of the bilateral basal ganglia. Compared to 2015 there is asymmetric enlargement of the right lateral ventricle, which is likely related to interval volume loss and mass effect on the left  lateral ventricle. Vascular: No hyperdense vessel or unexpected calcification. Skull: Normal. Negative for fracture or focal lesion. Sinuses/Orbits: No acute finding.  Bilateral lens replacement. Other: None. ASPECTS ([place] Program Early CT Score): 10 IMPRESSION: 1. No CT evidence of an acute infarct. 2. 1.1 cm mixed density subdural hematoma along the left cerebral convexity with 6 mm rightward midline shift and effacement of the sulci in the left cerebral hemisphere. 3. ASPECTS is 10. Findings were discussed with Dr. KiMaylon Peppersn 03/19/22 at 4:43 PM Electronically Signed   By: HeMarin Roberts.D.   On: 03/19/2022 16:43     Subjective: No acute issues/events overnight   Discharge Exam: Vitals:   03/28/22 0803 03/28/22 1139  BP: 118/65 116/65  Pulse: 63 71  Resp: 17 19  Temp: 97.6 F (36.4 C) 98 F (36.7 C)  SpO2: 100% 96%   Vitals:   03/28/22 0331 03/28/22 0600 03/28/22 0803 03/28/22 1139  BP: 136/77  118/65 116/65  Pulse: 70  63 71  Resp: '14  17 19  '$ Temp: 98.4 F (36.9 C)  97.6 F (36.4 C) 98 F (36.7 C)  TempSrc: Oral  Oral Oral  SpO2: 98%  100% 96%  Weight:  52.9 kg    Height:        General: Pt is alert, awake, not in acute distress Cardiovascular: RRR, S1/S2 +, no rubs, no gallops Respiratory: CTA bilaterally, no wheezing, no rhonchi Abdominal: Soft, NT, ND, bowel sounds + Extremities: no edema, no cyanosis    The results of significant diagnostics from this hospitalization (including imaging, microbiology, ancillary and laboratory) are listed below for reference.     Microbiology: Recent Results (from the past 240 hour(s))  Surgical pcr screen     Status: None   Collection Time: 03/22/22  3:31 AM   Specimen: Nasal Mucosa; Nasal Swab  Result Value Ref Range Status   MRSA, PCR NEGATIVE NEGATIVE Final   Staphylococcus aureus NEGATIVE NEGATIVE Final    Comment: (NOTE) The Xpert SA Assay (FDA approved for NASAL specimens in patients 48 years of age and  older), is one component of a comprehensive surveillance program. It is not intended to diagnose infection nor to guide or monitor treatment. Performed at [facility], [place] [address] [place], [place] [address]      Labs: BNP (last 3 results) No results for input(s): "BNP" in the last 8760 hours. Basic Metabolic Panel: No results for input(s): "NA", "K", "CL", "CO2", "GLUCOSE", "BUN", "CREATININE", "CALCIUM", "MG", "PHOS" in the last 168 hours. Liver Function Tests: No results for input(s): "AST", "ALT", "ALKPHOS", "BILITOT", "PROT", "ALBUMIN" in the last 168 hours. No results for input(s): "LIPASE", "AMYLASE" in the last 168 hours. No results for input(s): "AMMONIA" in the last 168 hours. CBC: No results for input(s): "WBC", "NEUTROABS", "HGB", "HCT", "MCV", "PLT" in the last 168 hours. Cardiac Enzymes: No results for input(s): "CKTOTAL", "CKMB", "CKMBINDEX", "TROPONINI" in the last 168 hours. BNP: Invalid input(s): "POCBNP" CBG: Recent Labs  Lab 03/27/22 1144 03/27/22 1727 03/27/22 2148 03/28/22 0620 03/28/22 1143  GLUCAP 309* 235* 272* 100* 251*   D-Dimer No results for input(s): "DDIMER" in the last 72 hours. Hgb A1c No results for input(s): "HGBA1C" in the last 72 hours. Lipid Profile No results for input(s): "CHOL", "HDL", "LDLCALC", "TRIG", "CHOLHDL", "LDLDIRECT" in the last 72 hours. Thyroid function studies No results for input(s): "TSH", "T4TOTAL", "T3FREE", "THYROIDAB" in the last 72 hours.  Invalid input(s): "FREET3" Anemia work up No results for input(s): "VITAMINB12", "FOLATE", "FERRITIN", "TIBC", "IRON", "RETICCTPCT" in the last 72 hours. Urinalysis    Component Value Date/Time   COLORURINE STRAW (A) 03/19/2022 1730   APPEARANCEUR CLEAR 03/19/2022 1730   LABSPEC 1.030 03/19/2022 1730   PHURINE 7.0 03/19/2022 1730   GLUCOSEU >=500 (A) 03/19/2022 1730   HGBUR MODERATE (A) 03/19/2022 1730   BILIRUBINUR NEGATIVE 03/19/2022 1730   KETONESUR  NEGATIVE 03/19/2022 1730   PROTEINUR 100 (A) 03/19/2022 1730   UROBILINOGEN 0.2 12/11/2013 1848   NITRITE NEGATIVE 03/19/2022 1730   LEUKOCYTESUR NEGATIVE 03/19/2022 1730   Sepsis Labs No results for input(s): "WBC" in the last 168 hours.  Invalid input(s): "PROCALCITONIN", "LACTICIDVEN" Microbiology Recent Results (from the past 240 hour(s))  Surgical pcr screen     Status: None   Collection Time: 03/22/22  3:31 AM   Specimen: Nasal Mucosa; Nasal Swab  Result Value Ref Range Status   MRSA, PCR NEGATIVE NEGATIVE Final   Staphylococcus aureus NEGATIVE NEGATIVE Final    Comment: (NOTE) The Xpert SA Assay (FDA approved for NASAL specimens in patients 28 years of age and older), is one component of a comprehensive surveillance program. It is not intended to diagnose infection nor to guide or monitor treatment. Performed at [facility], [place] [address] [place], [place] [address]      Time coordinating discharge: Over 30 minutes  SIGNED:   Little Ishikawa, DO [facility] 03/28/2022, 12:40 PM Pager   If 7PM-7AM, please contact night-coverage [email or web address]

## 2022-03-28 NOTE — Plan of Care (Signed)
  Problem: Education: Goal: Ability to describe self-care measures that may prevent or decrease complications (Diabetes Survival Skills Education) will improve Outcome: Adequate for Discharge Goal: Individualized Educational Video(s) Outcome: Adequate for Discharge   Problem: Coping: Goal: Ability to adjust to condition or change in health will improve Outcome: Adequate for Discharge   Problem: Fluid Volume: Goal: Ability to maintain a balanced intake and output will improve Outcome: Adequate for Discharge   Problem: Health Behavior/Discharge Planning: Goal: Ability to identify and utilize available resources and services will improve Outcome: Adequate for Discharge Goal: Ability to manage health-related needs will improve Outcome: Adequate for Discharge   Problem: Metabolic: Goal: Ability to maintain appropriate glucose levels will improve Outcome: Adequate for Discharge   Problem: Nutritional: Goal: Maintenance of adequate nutrition will improve Outcome: Adequate for Discharge Goal: Progress toward achieving an optimal weight will improve Outcome: Adequate for Discharge   Problem: Skin Integrity: Goal: Risk for impaired skin integrity will decrease Outcome: Adequate for Discharge   Problem: Tissue Perfusion: Goal: Adequacy of tissue perfusion will improve Outcome: Adequate for Discharge   Problem: Education: Goal: Knowledge of General Education information will improve Description: Including pain rating scale, medication(s)/side effects and non-pharmacologic comfort measures Outcome: Adequate for Discharge   Problem: Health Behavior/Discharge Planning: Goal: Ability to manage health-related needs will improve Outcome: Adequate for Discharge   Problem: Clinical Measurements: Goal: Ability to maintain clinical measurements within normal limits will improve Outcome: Adequate for Discharge Goal: Will remain free from infection Outcome: Adequate for Discharge Goal:  Diagnostic test results will improve Outcome: Adequate for Discharge Goal: Respiratory complications will improve Outcome: Adequate for Discharge Goal: Cardiovascular complication will be avoided Outcome: Adequate for Discharge   Problem: Activity: Goal: Risk for activity intolerance will decrease Outcome: Adequate for Discharge   Problem: Nutrition: Goal: Adequate nutrition will be maintained Outcome: Adequate for Discharge   Problem: Coping: Goal: Level of anxiety will decrease Outcome: Adequate for Discharge   Problem: Elimination: Goal: Will not experience complications related to bowel motility Outcome: Adequate for Discharge Goal: Will not experience complications related to urinary retention Outcome: Adequate for Discharge   Problem: Pain Managment: Goal: General experience of comfort will improve Outcome: Adequate for Discharge   Problem: Safety: Goal: Ability to remain free from injury will improve Outcome: Adequate for Discharge   Problem: Skin Integrity: Goal: Risk for impaired skin integrity will decrease Outcome: Adequate for Discharge   Problem: Education: Goal: Understanding of CV disease, CV risk reduction, and recovery process will improve Outcome: Adequate for Discharge Goal: Individualized Educational Video(s) Outcome: Adequate for Discharge   Problem: Activity: Goal: Ability to return to baseline activity level will improve Outcome: Adequate for Discharge   Problem: Cardiovascular: Goal: Ability to achieve and maintain adequate cardiovascular perfusion will improve Outcome: Adequate for Discharge Goal: Vascular access site(s) Level 0-1 will be maintained Outcome: Adequate for Discharge   Problem: Health Behavior/Discharge Planning: Goal: Ability to safely manage health-related needs after discharge will improve Outcome: Adequate for Discharge   

## 2022-03-28 NOTE — Progress Notes (Signed)
Patient ID: [name], male   DOB: [birth year], 81 y.o.   MRN: [id number]    Progress Note from the Palliative Medicine Team at [facility]   Patient Name: Jacob Barnes        Date: 03/28/2022 DOB: Aug 17, 1940  Age: 81 y.o. MRN#: [id number] Attending Physician: Little Ishikawa, MD Primary Care Physician: Fayrene Helper, MD Admit Date: 03/19/2022   Medical records reviewed, assessed patient and discussed with treatment team  81 year old male with history of mild dementia, DM2, HTN, CKD 3A with baseline creatinine 1.1-1.3, comes into the hospital with increased confusion, poor coordination per family. He initially presented to [facility] ED, found to have a subdural hematoma, and then transferred to [facility] for [facility] evaluation.  He lives at home with his wife.    This NP assessed patient at the bedside as a follow up for palliative medicine  needs and emotional support.   Patient is alert but remains pleasantly confused, no family at bedside.  Plan is for discharge to skilled nursing facility today.  I spoke to patient's wife by phone for continued conversation regarding current medical situation.     Education offered on current medical situation, specific to his continued physical functional and cognitive decline.,    Family can likely anticipate increased nursing care needs into the future   Again conversation was had regarding importance of conversation and documentation of advance directives.  Concepts specific to CODE STATU artificial feeding and hydration, continued IV antibiotics and rehospitalization was had.          Plan of care -Full code       -Educated patient/family to consider DNR/DNI status understanding evidenced based poor outcomes in similar hospitalized patient, as the cause of arrest is likely associated with advanced chronic illness rather than an easily reversible acute cardio-pulmonary event. -Treat the treatable hope for improvement -SNF for  short-term rehab -Ultimately return home previous living arrangement   Education offered today regarding  the importance of continued conversation with family and their  medical providers regarding overall plan of care and treatment options,  ensuring decisions are within the context of the patients values and GOCs.  Questions and concerns addressed   Discussed with Dr Avon Gully  35 minutes  Wadie Lessen NP  Palliative Medicine Team Team Phone # [phone] Pager [phone]

## 2022-03-28 NOTE — Progress Notes (Signed)
Physical Therapy Treatment Patient Details Name: Jacob Barnes MRN: [id number] DOB: 12/21/1940 Today's Date: 03/28/2022   History of Present Illness Pt is 82 yo male admitted 03/19/22 with subdural hematoma.  Neurosurgery consulted and pt for possible MMA embolization.  Pt with hx including but not limited to mild dementia, DM2, HTN, CKD, DM, and arthritis    PT Comments    Patient with OT at sink and able to transition to begin PT session. Patient continues to require cues for proper/safe use of RW during gait and transfers. He continues with posterior bias as coming to stand, and even compensates by bracing the back of his legs against recliner. When practicing repeated sit to stand, pt will adjust hips backward prior to coming to stand in order to enable himself to push legs against surface (even when cued to sit forward on EOC to prevent this behavior). Requires more repetition for carryover of proper/safe techniques.     Recommendations for follow up therapy are one component of a multi-disciplinary discharge planning process, led by the attending physician.  Recommendations may be updated based on patient status, additional functional criteria and insurance authorization.  Follow Up Recommendations  Skilled nursing-short term rehab (<3 hours/day) Can patient physically be transported by private vehicle: Yes   Assistance Recommended at Discharge Frequent or constant Supervision/Assistance  Patient can return home with the following A lot of help with bathing/dressing/bathroom;Assistance with cooking/housework;Help with stairs or ramp for entrance;A little help with walking and/or transfers;Direct supervision/assist for medications management;Direct supervision/assist for financial management;Assist for transportation   Equipment Recommendations  None recommended by PT    Recommendations for Other Services       Precautions / Restrictions Precautions Precautions: Fall;Other  (comment) Precaution Comments: systolic BP <315 Restrictions Weight Bearing Restrictions: No     Mobility  Bed Mobility               General bed mobility comments: up in room with OT on arrival    Transfers Overall transfer level: Needs assistance Equipment used: Rolling walker (2 wheels) Transfers: Sit to/from Stand Sit to Stand: Min assist           General transfer comment: pt tries to pull up on RW with posterior imbalance; vc for hand placement; pt using back of his legs against chair to assist with rising to standing and overcoming posterior lean    Ambulation/Gait Ambulation/Gait assistance: Min guard Gait Distance (Feet): 200 Feet Assistive device: Rolling walker (2 wheels) Gait Pattern/deviations: Step-through pattern, Decreased stride length, Narrow base of support   Gait velocity interpretation: 1.31 - 2.62 ft/sec, indicative of limited community ambulator   General Gait Details: slight steppage gait with RLE and then lands in foot flat; cues for heelstrike does not improve gait pattern; vc for proximity to [employer] assistance: Min guard Stair Management: Two rails, Alternating pattern Number of Stairs: 5 General stair comments: pt continues with quad weakness, especially on right; had pt lead with RLE when ascending to strengthen quads   Wheelchair Mobility    Modified Rankin (Stroke Patients Only)       Balance Overall balance assessment: Needs assistance Sitting-balance support: No upper extremity supported Sitting balance-Leahy Scale: Good     Standing balance support: No upper extremity supported, During functional activity Standing balance-Leahy Scale: Fair Standing balance comment: Pt continues with posterior lean with standing requiring contact guard assist in order to maintain upright balance intermittently with and without UE support.  Cognition Arousal/Alertness:  Awake/alert Behavior During Therapy: WFL for tasks assessed/performed Overall Cognitive Status: No family/caregiver present to determine baseline cognitive functioning                                 General Comments: Hx of dementia; followed basic commands; Oriented to self, location.        Exercises Other Exercises Other Exercises: in addition to stair training, step up/down on single step for RLE strengthening (both concentric and eccentric) x 5 reps Other Exercises: sit to stand without use of UEs x 10 reps; pt resorts to pushing with back of legs against chair to correct his posterior imbalance    General Comments        Pertinent Vitals/Pain Pain Assessment Pain Assessment: No/denies pain    Home Living                          Prior Function            PT Goals (current goals can now be found in the care plan section) Acute Rehab PT Goals Patient Stated Goal: return home if able; spoke with spouse who is agreeable to rehab if needed but prefers somewhere in [place] Time For Goal Achievement: 04/04/22 Potential to Achieve Goals: Good Progress towards PT goals: Progressing toward goals    Frequency    Min 3X/week      PT Plan Current plan remains appropriate    Co-evaluation              AM-PAC PT "6 Clicks" Mobility   Outcome Measure  Help needed turning from your back to your side while in a flat bed without using bedrails?: A Little Help needed moving from lying on your back to sitting on the side of a flat bed without using bedrails?: A Little Help needed moving to and from a bed to a chair (including a wheelchair)?: A Little Help needed standing up from a chair using your arms (e.g., wheelchair or bedside chair)?: A Little Help needed to walk in hospital room?: A Little Help needed climbing 3-5 steps with a railing? : A Little 6 Click Score: 18    End of Session Equipment Utilized During Treatment: Gait  belt Activity Tolerance: Patient tolerated treatment well Patient left: with call bell/phone within reach;in chair;with chair alarm set Nurse Communication: Mobility status PT Visit Diagnosis: Unsteadiness on feet (R26.81);History of falling (Z91.81)     Time: 2458-0998 PT Time Calculation (min) (ACUTE ONLY): 9 min  Charges:  $Therapeutic Exercise: 8-22 mins                      Arby Barrette, PT Acute Rehabilitation Services  Office [phone]    Rexanne Mano 03/28/2022, 11:33 AM

## 2022-04-14 ENCOUNTER — Telehealth: Payer: Self-pay | Admitting: Family Medicine

## 2022-04-14 NOTE — Telephone Encounter (Signed)
Lvm advising she will sign plan of care.

## 2022-04-14 NOTE — Telephone Encounter (Signed)
Jacob Barnes, [phone]   Wants to know if Dr. Moshe Cipro will be will to sign plan of care once it is developed?

## 2022-04-19 ENCOUNTER — Encounter: Payer: Self-pay | Admitting: Family Medicine

## 2022-04-19 ENCOUNTER — Ambulatory Visit (INDEPENDENT_AMBULATORY_CARE_PROVIDER_SITE_OTHER): Payer: Medicare HMO | Admitting: Family Medicine

## 2022-04-19 VITALS — BP 130/72 | HR 72 | Ht 64.0 in | Wt 114.0 lb

## 2022-04-19 DIAGNOSIS — S065XAD Traumatic subdural hemorrhage with loss of consciousness status unknown, subsequent encounter: Secondary | ICD-10-CM

## 2022-04-19 DIAGNOSIS — I10 Essential (primary) hypertension: Secondary | ICD-10-CM

## 2022-04-19 DIAGNOSIS — Z09 Encounter for follow-up examination after completed treatment for conditions other than malignant neoplasm: Secondary | ICD-10-CM

## 2022-04-19 DIAGNOSIS — J9601 Acute respiratory failure with hypoxia: Secondary | ICD-10-CM | POA: Diagnosis not present

## 2022-04-19 DIAGNOSIS — H9120 Sudden idiopathic hearing loss, unspecified ear: Secondary | ICD-10-CM | POA: Diagnosis not present

## 2022-04-19 DIAGNOSIS — S065XAA Traumatic subdural hemorrhage with loss of consciousness status unknown, initial encounter: Secondary | ICD-10-CM

## 2022-04-19 DIAGNOSIS — E1159 Type 2 diabetes mellitus with other circulatory complications: Secondary | ICD-10-CM | POA: Diagnosis not present

## 2022-04-19 DIAGNOSIS — H6122 Impacted cerumen, left ear: Secondary | ICD-10-CM

## 2022-04-19 NOTE — Patient Instructions (Addendum)
F/U in 2 months, call if you need me sooner  We will flush left ear today, but you do need evaluation by ENT and I have referred  you   Family please check his insurance to see id they will cover in home sitting , let us know if yes  Nurse pls verify from [facility] medication filled and what he is taking   Careful not to fall

## 2022-04-24 DIAGNOSIS — H6120 Impacted cerumen, unspecified ear: Secondary | ICD-10-CM | POA: Insufficient documentation

## 2022-04-24 DIAGNOSIS — H9120 Sudden idiopathic hearing loss, unspecified ear: Secondary | ICD-10-CM | POA: Insufficient documentation

## 2022-04-24 DIAGNOSIS — H6122 Impacted cerumen, left ear: Secondary | ICD-10-CM | POA: Insufficient documentation

## 2022-04-24 NOTE — Assessment & Plan Note (Signed)
resolved 

## 2022-04-24 NOTE — Assessment & Plan Note (Signed)
Successful flush by nursing with improvement in hearing, atraumatic

## 2022-04-24 NOTE — Assessment & Plan Note (Signed)
Neurologically stable , no deficits oin exam, new is kepra, has neurosurg follow up

## 2022-04-24 NOTE — Progress Notes (Signed)
Jacob Barnes     MRN: [id number]      DOB: [birth year]   HPI Mr. Helfman is here for follow up and re-evaluation of chronic medical conditions, medication management and review of any available recent lab and radiology data.  Preventive health is updated, specifically  Cancer screening and Immunization.   Hospitalized from 12/1 0/to 03/28/2022 , discharged to SNF and went home thi past weekend when he was admitted for confusion secondary to a subdural hematoma, and acute hypoxic respiratory failure.Other problems are uncontrolled diabetes and stage 3 CKD and dementia He had embolization of the middle meningeal artery on 12/ 13 and was discharged in a stable condition Stable at home to this point, but needs more supervision than currently getting, sister will work with his children to see if this can be arranged C/o markedly reduced hearing since the incident  ROS Denies recent fever or chills. Denies sinus pressure, nasal congestion, ear pain or sore throat. Denies chest congestion, productive cough or wheezing. Denies chest pains, palpitations and leg swelling Denies abdominal pain, nausea, vomiting,diarrhea or constipation.   Denies dysuria, frequency, hesitancy or incontinence. Denies joint pain, swelling and limitation in mobility. Denies headaches, seizures, numbness, or tingling. Denies depression, anxiety or insomnia. Denies skin break down or rash.   PE  BP 130/72 (BP Location: Right Arm, Patient Position: Sitting, Cuff Size: Normal)   Pulse 72   Ht '5\' 4"'$  (1.626 m)   Wt 114 lb (51.7 kg)   SpO2 96%   BMI 19.57 kg/m   Patient alert and oriented and in no cardiopulmonary distress.  HEENT: No facial asymmetry, EOMI,     Neck supple .Left TM occluded by cerumen, right clear  Chest: Clear to auscultation bilaterally.  CVS: S1, S2 no murmurs, no S3.Regular rate.  ABD: Soft non tender.   Ext: No edema  MS: Adequate ROM spine, shoulders, hips and knees.  Skin: Intact, no  ulcerations or rash noted.  Psych: Good eye contact, normal affect.  not anxious or depressed appearing.  CNS: CN 2-12 intact, power,  normal throughout.no focal deficits noted.   Assessment & Plan  Essential hypertension, benign DASH diet and commitment to daily physical activity for a minimum of 30 minutes discussed and encouraged, as a part of hypertension management. The importance of attaining a healthy weight is also discussed.     04/19/2022    1:54 PM 03/28/2022   11:39 AM 03/28/2022    8:03 AM 03/28/2022    6:00 AM 03/28/2022    3:31 AM 03/28/2022   12:05 AM 03/27/2022    7:52 PM  BP/Weight  Systolic BP 939 030 092  330 076 226  Diastolic BP 72 65 65  77 70 73  Wt. (Lbs) 114   116.62     BMI 19.57 kg/m2   20.02 kg/m2          Type 2 diabetes mellitus with vascular disease [facility]) Mr. Crosson is reminded of the importance of commitment to daily physical activity for 30 minutes or more, as able and the need to limit carbohydrate intake to 30 to 60 grams per meal to help with blood sugar control.   The need to take medication as prescribed, test blood sugar as directed, and to call between visits if there is a concern that blood sugar is uncontrolled is also discussed.   Mr. Klabunde is reminded of the importance of daily foot exam, annual eye examination, and good blood sugar, blood pressure and  cholesterol control.     Latest Ref Rng & Units 03/21/2022    2:43 AM 03/20/2022    5:32 AM 03/19/2022    4:52 PM 03/19/2022    4:15 PM 01/26/2022    2:34 PM  Diabetic Labs  HbA1c 4.0 - 5.6 %     9.1   Creatinine 0.61 - 1.24 mg/dL 1.03  1.14  1.60  1.52        04/19/2022    1:54 PM 03/28/2022   11:39 AM 03/28/2022    8:03 AM 03/28/2022    6:00 AM 03/28/2022    3:31 AM 03/28/2022   12:05 AM 03/27/2022    7:52 PM  BP/Weight  Systolic BP 098 119 147  829 562 130  Diastolic BP 72 65 65  77 70 73  Wt. (Lbs) 114   116.62     BMI 19.57 kg/m2   20.02 kg/m2          Latest Ref Rng & Units 01/26/2022    2:00 PM 01/17/2022   12:00 AM  Foot/eye exam completion dates  Eye Exam No Retinopathy  Retinopathy      Foot Form Completion  Done      This result is from an external source.      Uncontrolled , needs to f/u with Endo  Acute hypoxic respiratory failure ([place]) resolved  Subdural hematoma ([place]) Neurologically stable , no deficits oin exam, new is kepra, has neurosurg follow up  Hearing loss of left ear due to cerumen impaction Successful flush by nursing with improvement in hearing, atraumatic  Sudden idiopathic hearing loss, unspecified ear Chronic hearing loss , reported acutely worse following recent subdural hematoma, some improvement post ear flush, needs ENT eval  Hospital discharge follow-up Patient in for follow up of recent hospitalization. Discharge summary, and laboratory and radiology data are reviewed, and any questions or concerns  are discussed. Specific issues requiring follow up are specifically addressed.   Cerumen impaction Successful atraumatic ear flush, improvement in hearing post flush

## 2022-04-24 NOTE — Assessment & Plan Note (Signed)
Mr. Collymore is reminded of the importance of commitment to daily physical activity for 30 minutes or more, as able and the need to limit carbohydrate intake to 30 to 60 grams per meal to help with blood sugar control.   The need to take medication as prescribed, test blood sugar as directed, and to call between visits if there is a concern that blood sugar is uncontrolled is also discussed.   Mr. Ayer is reminded of the importance of daily foot exam, annual eye examination, and good blood sugar, blood pressure and cholesterol control.     Latest Ref Rng & Units 03/21/2022    2:43 AM 03/20/2022    5:32 AM 03/19/2022    4:52 PM 03/19/2022    4:15 PM 01/26/2022    2:34 PM  Diabetic Labs  HbA1c 4.0 - 5.6 %     9.1   Creatinine 0.61 - 1.24 mg/dL 1.03  1.14  1.60  1.52        04/19/2022    1:54 PM 03/28/2022   11:39 AM 03/28/2022    8:03 AM 03/28/2022    6:00 AM 03/28/2022    3:31 AM 03/28/2022   12:05 AM 03/27/2022    7:52 PM  BP/Weight  Systolic BP 546 568 127  517 001 749  Diastolic BP 72 65 65  77 70 73  Wt. (Lbs) 114   116.62     BMI 19.57 kg/m2   20.02 kg/m2         Latest Ref Rng & Units 01/26/2022    2:00 PM 01/17/2022   12:00 AM  Foot/eye exam completion dates  Eye Exam No Retinopathy  Retinopathy      Foot Form Completion  Done      This result is from an external source.      Uncontrolled , needs to f/u with Endo

## 2022-04-24 NOTE — Assessment & Plan Note (Signed)
DASH diet and commitment to daily physical activity for a minimum of 30 minutes discussed and encouraged, as a part of hypertension management. The importance of attaining a healthy weight is also discussed.     04/19/2022    1:54 PM 03/28/2022   11:39 AM 03/28/2022    8:03 AM 03/28/2022    6:00 AM 03/28/2022    3:31 AM 03/28/2022   12:05 AM 03/27/2022    7:52 PM  BP/Weight  Systolic BP 815 947 076  151 834 373  Diastolic BP 72 65 65  77 70 73  Wt. (Lbs) 114   116.62     BMI 19.57 kg/m2   20.02 kg/m2

## 2022-04-24 NOTE — Assessment & Plan Note (Signed)
Successful atraumatic ear flush, improvement in hearing post flush

## 2022-04-24 NOTE — Assessment & Plan Note (Signed)
Patient in for follow up of recent hospitalization. Discharge summary, and laboratory and radiology data are reviewed, and any questions or concerns  are discussed. Specific issues requiring follow up are specifically addressed.  

## 2022-04-24 NOTE — Assessment & Plan Note (Signed)
Chronic hearing loss , reported acutely worse following recent subdural hematoma, some improvement post ear flush, needs ENT eval

## 2022-04-27 ENCOUNTER — Ambulatory Visit: Payer: Medicare HMO | Admitting: Family Medicine

## 2022-04-27 ENCOUNTER — Other Ambulatory Visit: Payer: Self-pay | Admitting: *Deleted

## 2022-04-27 ENCOUNTER — Telehealth: Payer: Self-pay | Admitting: Nurse Practitioner

## 2022-04-27 DIAGNOSIS — E1159 Type 2 diabetes mellitus with other circulatory complications: Secondary | ICD-10-CM

## 2022-04-27 MED ORDER — BLOOD GLUCOSE METER KIT: PACK | 0 refills | Status: AC

## 2022-04-27 NOTE — Telephone Encounter (Signed)
Pt was recently admitted to Rehab and lost his reader. Can you send another into [facility], per pt's request

## 2022-04-27 NOTE — Telephone Encounter (Signed)
I checked [employer] and there was an order for Jacob Barnes but it says canceled, so I also need to know where he got it so I can make sure to send it to the right place.  My note shows Jacob Barnes 2 right?  Also Medicare will likely not cover it and he will have to pay out of pocket for it.

## 2022-04-27 NOTE — Telephone Encounter (Signed)
A Jacob Barnes left a message stating that the patient needed another glucose meter called in ,as he lost his since he was in the hospital. She ask that we send in another one and call and let her know.

## 2022-04-28 NOTE — Telephone Encounter (Signed)
I also received a call from a [employer]. She shared that Mr. Chuba had been in a hospital for a while. When he returned home his Elenor Legato, cell phone and some other things were missing. They have been unable to locate them. She ask if we could call in another CGM. Joy Smith,LPN sent a detailed message to the person at [facility], she has not heard back. I called Mrs. Alford Highland back and shared with her that it may be that the insurance will not cover a new and that he would have to pay out of pocket for it. I did send in a prescription for a meter and supplies to [facility]. Mrs. Alford Highland is aware and also that we would contact her with the results fro [employer].

## 2022-05-02 ENCOUNTER — Ambulatory Visit: Payer: Medicare HMO | Admitting: Nurse Practitioner

## 2022-05-02 ENCOUNTER — Other Ambulatory Visit (HOSPITAL_COMMUNITY): Payer: Self-pay | Admitting: Neurosurgery

## 2022-05-02 DIAGNOSIS — S065XAA Traumatic subdural hemorrhage with loss of consciousness status unknown, initial encounter: Secondary | ICD-10-CM

## 2022-05-04 ENCOUNTER — Other Ambulatory Visit: Payer: Self-pay | Admitting: *Deleted

## 2022-05-04 NOTE — Telephone Encounter (Signed)
Ms. Alford Highland was called back. We are going to send in a prescription for a new Freestyle reader to [facility]

## 2022-05-04 NOTE — Telephone Encounter (Signed)
F/u   Ms. Jacob Barnes is asking for a call back to discuss meter and supplies.

## 2022-05-12 ENCOUNTER — Ambulatory Visit (HOSPITAL_COMMUNITY)
Admission: RE | Admit: 2022-05-12 | Discharge: 2022-05-12 | Disposition: A | Discharge status: Home / Self Care | Payer: Medicare HMO | Source: Ambulatory Visit | Attending: Neurosurgery | Admitting: Neurosurgery

## 2022-05-12 DIAGNOSIS — S065XAA Traumatic subdural hemorrhage with loss of consciousness status unknown, initial encounter: Secondary | ICD-10-CM | POA: Insufficient documentation

## 2022-05-13 ENCOUNTER — Other Ambulatory Visit: Payer: Self-pay | Admitting: Family Medicine

## 2022-05-18 ENCOUNTER — Ambulatory Visit: Payer: Medicare HMO | Admitting: Nurse Practitioner

## 2022-05-18 DIAGNOSIS — I10 Essential (primary) hypertension: Secondary | ICD-10-CM

## 2022-05-18 DIAGNOSIS — E782 Mixed hyperlipidemia: Secondary | ICD-10-CM

## 2022-05-18 DIAGNOSIS — E1159 Type 2 diabetes mellitus with other circulatory complications: Secondary | ICD-10-CM

## 2022-06-08 ENCOUNTER — Encounter: Payer: Self-pay | Admitting: Radiology

## 2022-06-14 ENCOUNTER — Telehealth: Payer: Self-pay | Admitting: Family Medicine

## 2022-06-14 ENCOUNTER — Other Ambulatory Visit: Payer: Self-pay | Admitting: Family Medicine

## 2022-06-14 MED ORDER — LEVETIRACETAM 500 MG PO TABS: 500.0000 mg | ORAL_TABLET | Freq: Two times a day (BID) | ORAL | 5 refills | Status: DC

## 2022-06-14 MED ORDER — AMLODIPINE BESYLATE 5 MG PO TABS: 5.0000 mg | ORAL_TABLET | Freq: Every day | ORAL | 5 refills | Status: DC

## 2022-06-14 NOTE — Telephone Encounter (Signed)
Patient daughter Joaquin Bend called confused what medicine does patient need to take can he mix the 2 medicines Amlodipine (completely out of this medicine since this morning) and Benazepril (patient has at home now) for patient to take please return patient call at [phone].   Patient need med refill:  levETIRAcetam (KEPPRA) 500 MG tablet [id number]    amLODipine (NORVASC) 5 MG tablet [id number]   [id number] [place] [place]: [phone] Fax: [phone] #: [id number]

## 2022-06-14 NOTE — Telephone Encounter (Signed)
Called back in regard to previous tele message.  Has not heard back.

## 2022-06-15 ENCOUNTER — Other Ambulatory Visit: Payer: Self-pay | Admitting: Family Medicine

## 2022-06-15 NOTE — Telephone Encounter (Signed)
Patients daughter aware medication sent

## 2022-06-15 NOTE — Telephone Encounter (Signed)
[id number]

## 2022-06-20 ENCOUNTER — Ambulatory Visit: Payer: Medicare HMO | Admitting: Family Medicine

## 2022-06-21 ENCOUNTER — Ambulatory Visit (INDEPENDENT_AMBULATORY_CARE_PROVIDER_SITE_OTHER): Payer: Medicare HMO | Admitting: Family Medicine

## 2022-06-21 ENCOUNTER — Encounter: Payer: Self-pay | Admitting: Family Medicine

## 2022-06-21 ENCOUNTER — Ambulatory Visit: Payer: Medicare HMO | Admitting: Nurse Practitioner

## 2022-06-21 ENCOUNTER — Ambulatory Visit: Payer: Medicare HMO | Admitting: Family Medicine

## 2022-06-21 VITALS — BP 154/82 | HR 75 | Ht 64.0 in | Wt 111.1 lb

## 2022-06-21 DIAGNOSIS — E1159 Type 2 diabetes mellitus with other circulatory complications: Secondary | ICD-10-CM | POA: Diagnosis not present

## 2022-06-21 DIAGNOSIS — L03211 Cellulitis of face: Secondary | ICD-10-CM

## 2022-06-21 DIAGNOSIS — L0201 Cutaneous abscess of face: Secondary | ICD-10-CM

## 2022-06-21 DIAGNOSIS — I10 Essential (primary) hypertension: Secondary | ICD-10-CM | POA: Diagnosis not present

## 2022-06-21 DIAGNOSIS — E1165 Type 2 diabetes mellitus with hyperglycemia: Secondary | ICD-10-CM

## 2022-06-21 MED ORDER — CEPHALEXIN 500 MG PO CAPS: 500.0000 mg | ORAL_CAPSULE | Freq: Three times a day (TID) | ORAL | 0 refills | Status: DC

## 2022-06-21 NOTE — Patient Instructions (Addendum)
F/U 03/18 re evaluate abscess on right forehead, call if you need me sooner  I am asking that your daughter who is responsible for you accompany you to the next visit  CBC and diff , cmp and eGr and hBA1C today  You have a skin infection and abscess on right side of forehead  10 day course of keflex is prescribed, take as directed 3 times daily please   Thanks for choosing [facility], we consider it a privelige to serve you.

## 2022-06-21 NOTE — Progress Notes (Signed)
Jacob Barnes     MRN: [id number]      DOB: [birth year]   HPI Jacob Barnes is here swelling and redness  of right cheek which has developed into an infected cyst, daughter who cares for him has sent a note that it started as a blackhead Sister who brings him states he has been receiving treatmented for eye infection with drops over the past several weeks. No fever noted Sister states he lies in bed all the time, thinks he is epressed , is concerned as here is next to no communication between the daughter who he lives with and other family members, she feels he is depresse   ROS See HPI  Denies recent fever or chills. Denies sinus pressure, nasal congestion, ear pain or sore throat. Denies chest congestion, productive cough or wheezing. Denies chest pains, palpitations and leg swelling Denies abdominal pain, nausea, vomiting,diarrhea or constipation.   Denies dysuria, frequency, hesitancy or incontinence. Chronic joint pain and  limitation in mobility. Denies headaches, seizures, numbness, or tingling.  PE  BP (!) 154/82 (BP Location: Left Arm, Patient Position: Sitting, Cuff Size: Normal)   Pulse 75   Ht 5\' 4"  (1.626 m)   Wt 111 lb 1.9 oz (50.4 kg)   SpO2 97%   BMI 19.07 kg/m   Patient alert and  no cardiopulmonary distress.  HEENT: No facial asymmetry, EOMI,     Neck supple .Eryhtema of right cheek, abscess/ infected cyst  on right side of forehead unable to extrude material on direct pressure  Chest: Clear to auscultation bilaterally.  CVS: S1, S2 no murmurs, no S3.Regular rate.  ABD: Soft non tender.   Ext: No edema  MS: decreased  ROM spine, shoulders, hips and knees.  Skin: cellulitis left cheek with left abscess supraorbital Psych: Good eye contact, normal affect. Memory intact not anxious or depressed appearing.  CNS: CN 2-12 intact, power,  normal throughout.no focal deficits noted.   Assessment & Plan  Essential hypertension, benign Uncontrolled , needs to  bring medication so that adjustmment can be made  to meds DASH diet and commitment to daily physical activity for a minimum of 30 minutes discussed and encouraged, as a part of hypertension management. The importance of attaining a healthy weight is also discussed.     06/21/2022    4:11 PM 06/21/2022    4:00 PM 04/19/2022    1:54 PM 03/28/2022   11:39 AM 03/28/2022    8:03 AM 03/28/2022    6:00 AM 03/28/2022    3:31 AM  BP/Weight  Systolic BP 123456 0000000 [id number]  [id number]  Diastolic BP 82 82 72 65 65  77  Wt. (Lbs)  111.12 114   116.62   BMI  19.07 kg/m2 19.57 kg/m2   20.02 kg/m2        Uncontrolled type 2 diabetes mellitus with hyperglycemia [facility]) Jacob Barnes is reminded of the importance of commitment to daily physical activity for 30 minutes or more, as able and the need to limit carbohydrate intake to 30 to 60 grams per meal to help with blood sugar control.   The need to take medication as prescribed, test blood sugar as directed, and to call between visits if there is a concern that blood sugar is uncontrolled is also discussed.   Jacob Barnes is reminded of the importance of daily foot exam, annual eye examination, and good blood sugar, blood pressure and cholesterol control. Worsening, needs Endo follow up and management ,  an ongoing challenge, the hope is that family member who cares for him will be present at next visit     Latest Ref Rng & Units 06/21/2022    4:41 PM 03/21/2022    2:43 AM 03/20/2022    5:32 AM 03/19/2022    4:52 PM 03/19/2022    4:15 PM  Diabetic Labs  HbA1c 4.8 - 5.6 % 11.0       Creatinine 0.76 - 1.27 mg/dL 1.39  1.03  1.14  1.60  1.52       06/21/2022    4:11 PM 06/21/2022    4:00 PM 04/19/2022    1:54 PM 03/28/2022   11:39 AM 03/28/2022    8:03 AM 03/28/2022    6:00 AM 03/28/2022    3:31 AM  BP/Weight  Systolic BP 123456 0000000 [id number] [id number]  [id number]  Diastolic BP 82 82 72 65 65  77  Wt. (Lbs)  111.12 114   116.62   BMI  19.07 kg/m2 19.57 kg/m2   20.02 kg/m2        Latest Ref Rng & Units 01/26/2022    2:00 PM 01/17/2022   12:00 AM  Foot/eye exam completion dates  Eye Exam No Retinopathy  Retinopathy      Foot Form Completion  Done      This result is from an external source.        Cellulitis, face Keflex x 10 days f/u in  5 days  Acute abscess of face cBC and diff and keflex prescribed with close follow up, ED if worsens

## 2022-06-23 LAB — CBC WITH DIFFERENTIAL/PLATELET
Basophils Absolute: 0 10*3/uL (ref 0.0–0.2)
Basos: 0 %
EOS (ABSOLUTE): 0 10*3/uL (ref 0.0–0.4)
Eos: 0 %
Hematocrit: 32.3 % — ABNORMAL LOW (ref 37.5–51.0)
Hemoglobin: 10.9 g/dL — ABNORMAL LOW (ref 13.0–17.7)
Immature Grans (Abs): 0.3 10*3/uL — ABNORMAL HIGH (ref 0.0–0.1)
Immature Granulocytes: 3 %
Lymphocytes Absolute: 2.4 10*3/uL (ref 0.7–3.1)
Lymphs: 21 %
MCH: 30.7 pg (ref 26.6–33.0)
MCHC: 33.7 g/dL (ref 31.5–35.7)
MCV: 91 fL (ref 79–97)
Monocytes Absolute: 2.8 10*3/uL — ABNORMAL HIGH (ref 0.1–0.9)
Monocytes: 25 %
Neutrophils Absolute: 5.9 10*3/uL (ref 1.4–7.0)
Neutrophils: 51 %
Platelets: 184 10*3/uL (ref 150–450)
RBC: 3.55 x10E6/uL — ABNORMAL LOW (ref 4.14–5.80)
RDW: 14.4 % (ref 11.6–15.4)
WBC: 11.5 10*3/uL — ABNORMAL HIGH (ref 3.4–10.8)

## 2022-06-23 LAB — BMP8+EGFR
BUN/Creatinine Ratio: 9 — ABNORMAL LOW (ref 10–24)
BUN: 12 mg/dL (ref 8–27)
CO2: 26 mmol/L (ref 20–29)
Calcium: 9.1 mg/dL (ref 8.6–10.2)
Chloride: 101 mmol/L (ref 96–106)
Creatinine, Ser: 1.39 mg/dL — ABNORMAL HIGH (ref 0.76–1.27)
Glucose: 243 mg/dL — ABNORMAL HIGH (ref 70–99)
Potassium: 4.4 mmol/L (ref 3.5–5.2)
Sodium: 136 mmol/L (ref 134–144)
eGFR: 51 mL/min/{1.73_m2} — ABNORMAL LOW (ref >59)

## 2022-06-23 LAB — HEMOGLOBIN A1C
Est. average glucose Bld gHb Est-mCnc: 269 mg/dL
Hgb A1c MFr Bld: 11 % — ABNORMAL HIGH (ref 4.8–5.6)

## 2022-06-25 ENCOUNTER — Encounter: Payer: Self-pay | Admitting: Family Medicine

## 2022-06-25 DIAGNOSIS — L0201 Cutaneous abscess of face: Secondary | ICD-10-CM | POA: Insufficient documentation

## 2022-06-25 DIAGNOSIS — L03211 Cellulitis of face: Secondary | ICD-10-CM | POA: Insufficient documentation

## 2022-06-25 NOTE — Assessment & Plan Note (Signed)
Keflex x 10 days f/u in  5 days

## 2022-06-25 NOTE — Assessment & Plan Note (Signed)
cBC and diff and keflex prescribed with close follow up, ED if worsens

## 2022-06-25 NOTE — Assessment & Plan Note (Signed)
Jacob Barnes is reminded of the importance of commitment to daily physical activity for 30 minutes or more, as able and the need to limit carbohydrate intake to 30 to 60 grams per meal to help with blood sugar control.   The need to take medication as prescribed, test blood sugar as directed, and to call between visits if there is a concern that blood sugar is uncontrolled is also discussed.   Jacob Barnes is reminded of the importance of daily foot exam, annual eye examination, and good blood sugar, blood pressure and cholesterol control. Worsening, needs Endo follow up and management , an ongoing challenge, the hope is that family member who cares for him will be present at next visit     Latest Ref Rng & Units 06/21/2022    4:41 PM 03/21/2022    2:43 AM 03/20/2022    5:32 AM 03/19/2022    4:52 PM 03/19/2022    4:15 PM  Diabetic Labs  HbA1c 4.8 - 5.6 % 11.0       Creatinine 0.76 - 1.27 mg/dL 1.39  1.03  1.14  1.60  1.52       06/21/2022    4:11 PM 06/21/2022    4:00 PM 04/19/2022    1:54 PM 03/28/2022   11:39 AM 03/28/2022    8:03 AM 03/28/2022    6:00 AM 03/28/2022    3:31 AM  BP/Weight  Systolic BP 123456 0000000 [id number] [id number]  [id number]  Diastolic BP 82 82 72 65 65  77  Wt. (Lbs)  111.12 114   116.62   BMI  19.07 kg/m2 19.57 kg/m2   20.02 kg/m2       Latest Ref Rng & Units 01/26/2022    2:00 PM 01/17/2022   12:00 AM  Foot/eye exam completion dates  Eye Exam No Retinopathy  Retinopathy      Foot Form Completion  Done      This result is from an external source.

## 2022-06-25 NOTE — Assessment & Plan Note (Signed)
Uncontrolled , needs to bring medication so that adjustmment can be made  to meds DASH diet and commitment to daily physical activity for a minimum of 30 minutes discussed and encouraged, as a part of hypertension management. The importance of attaining a healthy weight is also discussed.     06/21/2022    4:11 PM 06/21/2022    4:00 PM 04/19/2022    1:54 PM 03/28/2022   11:39 AM 03/28/2022    8:03 AM 03/28/2022    6:00 AM 03/28/2022    3:31 AM  BP/Weight  Systolic BP 123456 0000000 [id number] [id number]  [id number]  Diastolic BP 82 82 72 65 65  77  Wt. (Lbs)  111.12 114   116.62   BMI  19.07 kg/m2 19.57 kg/m2   20.02 kg/m2

## 2022-06-29 ENCOUNTER — Ambulatory Visit: Payer: Medicare HMO | Admitting: Nurse Practitioner

## 2022-06-29 ENCOUNTER — Ambulatory Visit (INDEPENDENT_AMBULATORY_CARE_PROVIDER_SITE_OTHER): Payer: Medicare HMO | Admitting: Family Medicine

## 2022-06-29 ENCOUNTER — Other Ambulatory Visit (HOSPITAL_COMMUNITY): Payer: Self-pay | Admitting: Neurosurgery

## 2022-06-29 ENCOUNTER — Encounter: Payer: Self-pay | Admitting: Family Medicine

## 2022-06-29 VITALS — BP 113/63 | HR 66 | Ht 64.0 in | Wt 110.1 lb

## 2022-06-29 DIAGNOSIS — E1165 Type 2 diabetes mellitus with hyperglycemia: Secondary | ICD-10-CM | POA: Diagnosis not present

## 2022-06-29 DIAGNOSIS — E1159 Type 2 diabetes mellitus with other circulatory complications: Secondary | ICD-10-CM

## 2022-06-29 DIAGNOSIS — L03211 Cellulitis of face: Secondary | ICD-10-CM | POA: Diagnosis not present

## 2022-06-29 DIAGNOSIS — E785 Hyperlipidemia, unspecified: Secondary | ICD-10-CM

## 2022-06-29 DIAGNOSIS — E782 Mixed hyperlipidemia: Secondary | ICD-10-CM

## 2022-06-29 DIAGNOSIS — I10 Essential (primary) hypertension: Secondary | ICD-10-CM | POA: Diagnosis not present

## 2022-06-29 DIAGNOSIS — S065XAA Traumatic subdural hemorrhage with loss of consciousness status unknown, initial encounter: Secondary | ICD-10-CM

## 2022-06-29 DIAGNOSIS — L0201 Cutaneous abscess of face: Secondary | ICD-10-CM

## 2022-06-29 MED ORDER — CEPHALEXIN 500 MG PO CAPS: 500.0000 mg | ORAL_CAPSULE | Freq: Four times a day (QID) | ORAL | 0 refills | Status: DC

## 2022-06-29 NOTE — Patient Instructions (Addendum)
Please cancel April follow up  and reschedule for mid May, all if you need me sooner  Infection is improving, and additional 10 day course of keflex is prescribed, if starts to get worse then he will need surgeon to clear up  [place] you keep appointment with Diabetes doctor you will get number to call from Nurse on you r way out    ,Thanks for choosing [facility], we consider it a privelige to serve you.

## 2022-07-01 NOTE — Assessment & Plan Note (Signed)
Controlled, no change in medication  

## 2022-07-01 NOTE — Progress Notes (Signed)
Jacob Barnes     MRN: [id number]      DOB: Aug 25, 1940   HPI Jacob Barnes is here for follow up of abscess and skin infection of right side of face. Daughter accompanies him She reports good drainage from the area, and has been applying topical prep also Denies fever or chills Discussed briefly his absolute need to get blood sugar controlled, states she cannot keep appt with Endo today, will re schedule as he has an eye exam also States her step mother will soon be caring for him again and they are seriously considering finding a new Provider nearer home which I totally support ROS Denies recent fever or chills. Denies sinus pressure, nasal congestion, ear pain or sore throat. Denies chest congestion, productive cough or wheezing. Denies chest pains, palpitations and leg swelling Denies abdominal pain, nausea, vomiting,diarrhea or constipation.   Denies dysuria, frequency, hesitancy or incontinence. Denies joint pain, swelling and limitation in mobility. Denies headaches, seizures, numbness, or tingling. Denies depression, anxiety or insomnia. ash.   PE  BP 113/63 (BP Location: Right Arm, Patient Position: Sitting, Cuff Size: Normal)   Pulse 66   Ht 5\' 4"  (1.626 m)   Wt 110 lb 1.3 oz (49.9 kg)   SpO2 96%   BMI 18.90 kg/m   Patient alert and oriented and in no cardiopulmonary distress.  HEENT: No facial asymmetry, EOMI,     Neck supple .  Chest: Clear to auscultation bilaterally.  CVS: S1, S2 no murmurs, no S3.Regular rate.  ABD: Soft non tender.   Ext: No edema  MS: Adequate ROM spine, shoulders, hips and knees.  Skin: Iapprox 60 % reduction in  right facial abscess from prior visit,  with less erythema, cyst max diameter approx 5 cm .  Psych: Good eye contact, normal affect.  not anxious or depressed appearing.  CNS: CN 2-12 intact, power,  normal throughout.no focal deficits noted.   Assessment & Plan  Cellulitis, face Marked improvement , however peristent  infection/ abscess, extend antibiotic course for an additional 10 days, shoul totally clear  by then if worsens or persists will need I/d  or surgical mangement, daughter aware and understands  Acute abscess of face 60 % reduction in size from initial presentation and draining, unable to extrude pus t visit however daughter reports she is getting purulent material from site Additional antibiotic course is prescribed  Uncontrolled type 2 diabetes mellitus with hyperglycemia Jacob Barnes) Jacob Barnes is reminded of the importance of commitment to daily physical activity for 30 minutes or more, as able and the need to limit carbohydrate intake to 30 to 60 grams per meal to help with blood sugar control.   The need to take medication as prescribed, test blood sugar as directed, and to call between visits if there is a concern that blood sugar is uncontrolled is also discussed.   Jacob Barnes is reminded of the importance of daily foot exam, annual eye examination, and good blood sugar, blood pressure and cholesterol control.     Latest Ref Rng & Units 06/21/2022    4:41 PM 03/21/2022    2:43 AM 03/20/2022    5:32 AM 03/19/2022    4:52 PM 03/19/2022    4:15 PM  Diabetic Labs  HbA1c 4.8 - 5.6 % 11.0       Creatinine 0.76 - 1.27 mg/dL 1.39  1.03  1.14  1.60  1.52       06/29/2022    1:50 PM 06/21/2022  4:11 PM 06/21/2022    4:00 PM 04/19/2022    1:54 PM 03/28/2022   11:39 AM 03/28/2022    8:03 AM 03/28/2022    6:00 AM  BP/Weight  Systolic BP 123456 123456 0000000 [id number] 123456   Diastolic BP 63 82 82 72 65 65   Wt. (Lbs) 110.08  111.12 114   116.62  BMI 18.9 kg/m2  19.07 kg/m2 19.57 kg/m2   20.02 kg/m2      Latest Ref Rng & Units 01/26/2022    2:00 PM 01/17/2022   12:00 AM  Foot/eye exam completion dates  Eye Exam No Retinopathy  Retinopathy      Foot Form Completion  Done      This result is from an external source.      Uncontrolled , importance of Endo management is stressed, today's appt is  being re scheduled by daughter as she states he ahs appt for glasses  Essential hypertension, benign Controlled, no change in medication   Dyslipidemia Hyperlipidemia:Low fat diet discussed and encouraged.   Lipid Panel  Lab Results  Component Value Date   CHOL 102 10/14/2021   HDL 51 10/14/2021   LDLCALC 40 10/14/2021   TRIG 40 10/14/2021   CHOLHDL 2.0 10/14/2021     Controlled, no change in medication

## 2022-07-01 NOTE — Assessment & Plan Note (Signed)
Jacob Barnes is reminded of the importance of commitment to daily physical activity for 30 minutes or more, as able and the need to limit carbohydrate intake to 30 to 60 grams per meal to help with blood sugar control.   The need to take medication as prescribed, test blood sugar as directed, and to call between visits if there is a concern that blood sugar is uncontrolled is also discussed.   Jacob Barnes is reminded of the importance of daily foot exam, annual eye examination, and good blood sugar, blood pressure and cholesterol control.     Latest Ref Rng & Units 06/21/2022    4:41 PM 03/21/2022    2:43 AM 03/20/2022    5:32 AM 03/19/2022    4:52 PM 03/19/2022    4:15 PM  Diabetic Labs  HbA1c 4.8 - 5.6 % 11.0       Creatinine 0.76 - 1.27 mg/dL 1.39  1.03  1.14  1.60  1.52       06/29/2022    1:50 PM 06/21/2022    4:11 PM 06/21/2022    4:00 PM 04/19/2022    1:54 PM 03/28/2022   11:39 AM 03/28/2022    8:03 AM 03/28/2022    6:00 AM  BP/Weight  Systolic BP 123456 123456 [id number] 123456   Diastolic BP 63 82 82 72 65 65   Wt. (Lbs) 110.08  111.12 114   116.62  BMI 18.9 kg/m2  19.07 kg/m2 19.57 kg/m2   20.02 kg/m2      Latest Ref Rng & Units 01/26/2022    2:00 PM 01/17/2022   12:00 AM  Foot/eye exam completion dates  Eye Exam No Retinopathy  Retinopathy      Foot Form Completion  Done      This result is from an external source.      Uncontrolled , importance of Endo management is stressed, today's appt is being re scheduled by daughter as she states he ahs appt for glasses

## 2022-07-01 NOTE — Assessment & Plan Note (Signed)
Hyperlipidemia:Low fat diet discussed and encouraged.   Lipid Panel  Lab Results  Component Value Date   CHOL 102 10/14/2021   HDL 51 10/14/2021   LDLCALC 40 10/14/2021   TRIG 40 10/14/2021   CHOLHDL 2.0 10/14/2021     Controlled, no change in medication  

## 2022-07-01 NOTE — Assessment & Plan Note (Signed)
60 % reduction in size from initial presentation and draining, unable to extrude pus t visit however daughter reports she is getting purulent material from site Additional antibiotic course is prescribed

## 2022-07-01 NOTE — Assessment & Plan Note (Signed)
Marked improvement , however peristent infection/ abscess, extend antibiotic course for an additional 10 days, shoul totally clear  by then if worsens or persists will need I/d  or surgical mangement, daughter aware and understands

## 2022-07-13 ENCOUNTER — Encounter: Payer: Self-pay | Admitting: Family Medicine

## 2022-07-13 ENCOUNTER — Ambulatory Visit (HOSPITAL_COMMUNITY)
Admission: RE | Admit: 2022-07-13 | Discharge: 2022-07-13 | Disposition: A | Discharge status: Home / Self Care | Payer: Medicare HMO | Source: Ambulatory Visit | Attending: Neurosurgery | Admitting: Neurosurgery

## 2022-07-13 ENCOUNTER — Ambulatory Visit (INDEPENDENT_AMBULATORY_CARE_PROVIDER_SITE_OTHER): Payer: Medicare HMO | Admitting: Family Medicine

## 2022-07-13 VITALS — Ht 64.0 in | Wt 113.0 lb

## 2022-07-13 DIAGNOSIS — Z0000 Encounter for general adult medical examination without abnormal findings: Secondary | ICD-10-CM

## 2022-07-13 DIAGNOSIS — S065XAA Traumatic subdural hemorrhage with loss of consciousness status unknown, initial encounter: Secondary | ICD-10-CM | POA: Insufficient documentation

## 2022-07-13 NOTE — Progress Notes (Signed)
Subjective:   Jacob Barnes is a 82 y.o. male who presents for Medicare Annual/Subsequent preventive examination.  Review of Systems    Patient denies pain, fever, chills, chest pain, palpations ,shortness of breath, blurred vision,cough, abdominal pain, nausea, vomiting, headache, dizziness. Patient is not feeling nervous or anxious.        Objective:    There were no vitals filed for this visit. There is no height or weight on file to calculate BMI.     03/21/2022    6:00 PM 01/13/2022    6:33 AM 01/10/2022   11:33 AM 07/04/2021   10:30 AM 07/04/2021   10:29 AM 05/03/2021    1:58 PM 02/11/2020   11:45 AM  Advanced Directives  Does Patient Have a Medical Advance Directive? No No No No No No No  Would patient like information on creating a medical advance directive? No - Patient declined  No - Patient declined Yes (ED - Information included in AVS)  No - Patient declined No - Patient declined    Current Medications (verified) Outpatient Encounter Medications as of 07/13/2022  Medication Sig   amLODipine (NORVASC) 5 MG tablet Take 1 tablet (5 mg total) by mouth daily.   atorvastatin (LIPITOR) 10 MG tablet GIVE 1 TABLET BY MOUTH AT BEDTIME EVERY MONDAY/WEDNESDAY/FRIDAY FOR HIGH CHOLESTEROL   blood glucose meter kit and supplies Dispense based on patient and insurance preference. Use up to two times daily as directed. (FOR ICD-10 E10.9, E11.9).   CALCIUM PO Take 1 tablet by mouth 3 (three) times a week.   cephALEXin (KEFLEX) 500 MG capsule Take 1 capsule (500 mg total) by mouth 3 (three) times daily.   cephALEXin (KEFLEX) 500 MG capsule Take 1 capsule (500 mg total) by mouth 4 (four) times daily.   donepezil (ARICEPT) 5 MG tablet TAKE 1 TABLET BY MOUTH EVERYDAY AT BEDTIME   dorzolamide-timolol (COSOPT) 2-0.5 % ophthalmic solution 1 drop 2 (two) times daily.   insulin degludec (TRESIBA FLEXTOUCH) 100 UNIT/ML FlexTouch Pen Inject 10 Units into the skin at bedtime.   Insulin Glargine  (BASAGLAR KWIKPEN) 100 UNIT/ML Inject 10 Units into the skin at bedtime.   Insulin Pen Needle (PEN NEEDLES) 31G X 6 MM MISC 1 each by Does not apply route at bedtime.   latanoprost (XALATAN) 0.005 % ophthalmic solution 1 drop at bedtime.   levETIRAcetam (KEPPRA) 500 MG tablet Take 1 tablet (500 mg total) by mouth 2 (two) times daily.   neomycin-polymyxin b-dexamethasone (MAXITROL) 3.5-10000-0.1 SUSP Place into the right eye.   rosuvastatin (CRESTOR) 5 MG tablet Take one tablet by mouth every Monday , Wednesday and Friday   sitaGLIPtin (JANUVIA) 100 MG tablet GIVE 1 TABLET BY MOUTH DAILY FOR DIABETES   No facility-administered encounter medications on file as of 07/13/2022.    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/2009   Qualifier: Diagnosis of  By: Moshe Cipro MD, Margaret     GERD (gastroesophageal reflux  disease)    Glaucoma    both eyes   History of kidney stones    Hyperlipemia    Hypertension    Iron deficiency anemia 01/10/2014   Other pancytopenia ([place]) 01/10/2014   New in 01/2014, undergoing pathology review    Rash    front abdomen no drainage   Past Surgical History:  Procedure Laterality Date   AMPUTATION Right 09/01/2016   Procedure: AMPUTATION RIGHT MIDDLE FINGER TIP;  Surgeon: Mcarthur Rossetti, MD;  Location: [facility];  Service: Orthopedics;  Laterality: Right;   AMPUTATION Right 10/10/2016   Procedure: REPEAT IRRIGATION AND DEBRIDEMENT RIGHT MIDDLE FINGER WITH AMPUTATION THROUGH PROXIMAL PHALANX;  Surgeon: Mcarthur Rossetti, MD;  Location: [facility];  Service: Orthopedics;  Laterality: Right;   APPENDECTOMY     BIOPSY  01/13/2022   Procedure: BIOPSY;  Surgeon: Harvel Quale, MD;  Location: [facility];  Service: Gastroenterology;;   COLONOSCOPY  09/06/2011   Procedure: COLONOSCOPY;  Surgeon: Rogene Houston, MD;  [facility];  Service: Endoscopy;  Laterality: N/A;  830   COLONOSCOPY WITH PROPOFOL N/A 01/13/2022   Procedure: COLONOSCOPY WITH PROPOFOL;  Surgeon: Harvel Quale, MD;  [facility];  Service: Gastroenterology;  Laterality: N/A;  200 ASA 3   CYSTOSCOPY/RETROGRADE/URETEROSCOPY/STONE EXTRACTION WITH BASKET     ESOPHAGOGASTRODUODENOSCOPY N/A 01/15/2014   Procedure: ESOPHAGOGASTRODUODENOSCOPY (EGD);  Surgeon: Rogene Houston, MD;  [facility];  Service: Endoscopy;  Laterality: N/A;  200   ESOPHAGOGASTRODUODENOSCOPY N/A 04/22/2014   Procedure: ESOPHAGOGASTRODUODENOSCOPY (EGD);  Surgeon: Rogene Houston, MD;  [facility];  Service: Endoscopy;  Laterality: N/A;  240   EYE SURGERY Bilateral yrs ago   ioc for cataract   IR ANGIO EXTERNAL CAROTID SEL EXT CAROTID UNI L MOD SED  03/22/2022   IR ANGIO INTRA EXTRACRAN SEL INTERNAL CAROTID UNI L MOD SED  03/26/2022   IR ANGIOGRAM FOLLOW UP STUDY  03/26/2022   IR NEURO EACH ADD'L AFTER BASIC UNI LEFT (MS)  03/26/2022   IR TRANSCATH/EMBOLIZ  03/26/2022   MALONEY DILATION N/A 01/15/2014   Procedure: Venia Minks DILATION;  Surgeon: Rogene Houston, MD;  [facility];  Service: Endoscopy;  Laterality: N/A;   MALONEY DILATION  04/22/2014   Procedure: Venia Minks DILATION;  Surgeon: Rogene Houston, MD;  [facility];  Service: Endoscopy;;   POLYPECTOMY  01/13/2022   Procedure: POLYPECTOMY;  Surgeon: Montez Morita, Quillian Quince, MD;  [facility];  Service: Gastroenterology;;   RADIOLOGY WITH ANESTHESIA Left 03/22/2022   Procedure: LEFT MMA EMBOLIZATION;  Surgeon: Consuella Lose, MD;  Location: [facility];  Service: Radiology;  Laterality: Left;   surgery for left eye bleedf  2017   Family History  Problem Relation Age of Onset   Diabetes Mother    Hypertension Mother    Hyperlipidemia Mother    Stroke Mother    Heart failure Mother    Diabetes Father    Heart disease Father    Hypertension Father    Hyperlipidemia Father    Heart attack Father    Diabetes Brother    Diabetes Brother    Diabetes Son    Social History   Socioeconomic History   Marital status: Married    Spouse name: Not on file   Number of children: Not on file   Years of education: Not on file   Highest education level: Not on file  Occupational History   Not  on file  Tobacco Use   Smoking status: Never    Passive exposure: Never   Smokeless tobacco: Never  Vaping Use   Vaping Use: Never used  Substance and Sexual Activity   Alcohol use: No   Drug use: No   Sexual activity: Yes  Other Topics Concern   Not on file  Social History Narrative   Not on file   Social Determinants of Health   Financial Resource Strain: Low Risk  (04/06/2020)   Overall Financial Resource Strain (CARDIA)    Difficulty of Paying Living Expenses: Not hard at all  Food Insecurity: No Food Insecurity (03/21/2022)   Hunger Vital Sign    Worried About Running Out of Food in the Last Year: Never true    Ran Out of Food in the Last Year: Never true  Transportation Needs: No Transportation Needs (03/21/2022)   PRAPARE - Hydrologist (Medical): No    Lack of Transportation (Non-Medical): No  Physical Activity: Insufficiently Active (04/06/2020)   Exercise Vital Sign    Days of Exercise per Week: 5 days    Minutes of Exercise per Session: 10 min  Stress: No Stress Concern Present (04/06/2020)   [place]    Feeling of Stress : Not at all  Social Connections: Moderately Isolated (04/06/2020)   Social  Connection and Isolation Panel [NHANES]    Frequency of Communication with Friends and Family: More than three times a week    Frequency of Social Gatherings with Friends and Family: More than three times a week    Attends Religious Services: Never    Marine scientist or Organizations: No    Attends Archivist Meetings: Never    Marital Status: Married    Tobacco Counseling Counseling given: Not Answered   Clinical Intake:               Diabetic? Hemoglobin A1c 11.0         Activities of Daily Living    03/21/2022    1:21 AM 03/21/2022    1:18 AM  In your present state of health, do you have any difficulty performing the following activities:  Hearing?  0  Vision?  0  Difficulty concentrating or making decisions?  1  Walking or climbing stairs?  0  Dressing or bathing?  1  Doing errands, shopping? 1     Patient Care Team: Fayrene Helper, MD as PCP - General Zigmund Daniel Chrystie Nose, MD as Consulting Physician (Ophthalmology)  Indicate any recent Medical Services you may have received from other than [facility] providers in the past year (date may be approximate).     Assessment:   This is a routine wellness examination for [place].  Hearing/Vision screen No results found.  Dietary issues and exercise activities discussed:  Discussed DASH diet which includes vegetables,fruits,whole grains, fat free or low fat diary,fish,poultry,beans,nuts and seeds,vegetable oils. Find an activity that you will enjoy and start to be active at least 5 days a week for 30 minutes each day.    Goals Addressed   None    Depression Screen    06/29/2022    1:52 PM 06/21/2022    4:02 PM 04/19/2022    1:57 PM 02/16/2022    1:12 PM 10/18/2021    3:31 PM 09/30/2021   11:11 AM 07/04/2021   10:31 AM  PHQ 2/9 Scores  PHQ - 2 Score 0 3 3 0  0 0 0  PHQ- 9 Score 0 18 4        Fall Risk    06/29/2022    1:52 PM 06/21/2022    4:02 PM 04/19/2022    1:57 PM 02/16/2022    1:12 PM  10/18/2021    3:31 PM  [place] in the past year? 1 1 1 1  0  Number falls in past yr: 0 0 0 0 0  Injury with Fall? 0 1 1 1  0  Risk for fall due to : History of fall(s);Impaired balance/gait;Impaired mobility Impaired balance/gait;History of fall(s) Impaired balance/gait;History of fall(s) History of fall(s) No Fall Risks  Follow up Falls evaluation completed Falls evaluation completed Falls evaluation completed Falls evaluation completed Falls evaluation completed    [place]:  Any stairs in or around the home? Yes  If so, are there any without handrails? No  Home free of loose throw rugs in walkways, pet beds, electrical cords, etc? Yes  Adequate lighting in your home to reduce risk of falls? Yes   ASSISTIVE DEVICES UTILIZED TO PREVENT FALLS:  Life alert? No  Use of a cane, walker or w/c? Yes  Grab bars in the bathroom? No  Shower chair or bench in shower? No  Elevated toilet seat or a handicapped toilet? No   TIMED UP AND GO:  Was the test performed? Yes .  Length of time to ambulate 10 feet: 7 sec.   Gait slow and steady with assistive device  Cognitive Function:    01/20/2021    3:30 PM 10/15/2018   11:54 AM  MMSE - Mini Mental State Exam  Orientation to time 4 5  Orientation to Place 3 5  Registration 3 3  Attention/ Calculation 2 5  Recall 0 3  Language- name 2 objects 2 2  Language- repeat 1 1  Language- follow 3 step command 3 3  Language- read & follow direction 1 1  Write a sentence 1 1  Copy design 0 0  Total score 20 29        07/04/2021   10:39 AM 02/11/2020   11:47 AM 02/10/2019   12:04 PM 01/21/2018    1:42 PM  6CIT Screen  What Year? 0 points 0 points 0 points 0 points  What month? 0 points 0 points 0 points 0 points  What time? 0 points 0 points 0 points 0 points  Count back from 20 0 points 4 points 0 points 0 points  Months in reverse 0 points 4 points 0 points 0 points  Repeat phrase 6 points 2  points 0 points 4 points  Total Score 6 points 10 points 0 points 4 points    Immunizations Immunization History  Administered Date(s) Administered   Fluad Quad(high Dose 65+) 02/27/2019, 01/20/2021, 02/16/2022   Influenza,inj,Quad PF,6+ Mos 03/17/2013, 03/17/2014, 03/18/2015, 01/04/2016, 12/05/2016, 12/02/2019   Influenza-Unspecified 01/17/2017   Moderna Sars-Covid-2 Vaccination 06/23/2019, 07/21/2019, 02/12/2020   PPD Test 08/16/2010, 08/18/2010, 11/03/2013   Pneumococcal Conjugate-13 11/03/2013   Pneumococcal Polysaccharide-23 12/21/2008   Td 12/21/2008    TDAP status: Due, Education has been provided regarding the importance of this vaccine. Advised may receive this vaccine at local pharmacy or Health Dept. Aware to provide a copy of the vaccination record if obtained from local pharmacy or Health Dept. Verbalized acceptance and understanding.  Flu Vaccine status: Due, Education has been provided regarding the importance of this vaccine. Advised may receive this vaccine  at local pharmacy or Health Dept. Aware to provide a copy of the vaccination record if obtained from local pharmacy or Health Dept. Verbalized acceptance and understanding.  Pneumococcal vaccine status: Up to date  Covid-19 vaccine status: Information provided on how to obtain vaccines.   Qualifies for Shingles Vaccine? Yes   Zostavax completed No   Shingrix Completed?: No.    Education has been provided regarding the importance of this vaccine. Patient has been advised to call insurance company to determine out of pocket expense if they have not yet received this vaccine. Advised may also receive vaccine at local pharmacy or Health Dept. Verbalized acceptance and understanding.  Screening Tests Health Maintenance  Topic Date Due   Zoster Vaccines- Shingrix (1 of 2) Never done   DTaP/Tdap/Td (2 - Tdap) 12/22/2018   COVID-19 Vaccine (4 - 2023-24 season) 12/09/2021   Diabetic kidney evaluation - Urine ACR   06/15/2022   Medicare Annual Wellness (AWV)  07/05/2022   INFLUENZA VACCINE  11/09/2022   HEMOGLOBIN A1C  12/22/2022   OPHTHALMOLOGY EXAM  01/18/2023   FOOT EXAM  01/27/2023   Diabetic kidney evaluation - eGFR measurement  06/21/2023   Pneumonia Vaccine 13+ Years old  Completed   HPV VACCINES  Aged Out    Health Maintenance  Health Maintenance Due  Topic Date Due   Zoster Vaccines- Shingrix (1 of 2) Never done   DTaP/Tdap/Td (2 - Tdap) 12/22/2018   COVID-19 Vaccine (4 - 2023-24 season) 12/09/2021   Diabetic kidney evaluation - Urine ACR  06/15/2022   Medicare Annual Wellness (AWV)  07/05/2022    Colorectal cancer screening: No longer required.   Lung Cancer Screening: (Low Dose CT Chest recommended if Age 24-80 years, 30 pack-year currently smoking OR have quit w/in 15years.) does not qualify.   Lung Cancer Screening Referral: N/A  Additional Screening:  Hepatitis C Screening: does qualify; Not Completed   Vision Screening: Recommended annual ophthalmology exams for early detection of glaucoma and other disorders of the eye. Is the patient up to date with their annual eye exam?  Yes  Who is the provider or what is the name of the office in which the patient attends annual eye exams? [facility] If pt is not established with a provider, would they like to be referred to a provider to establish care? No .   Dental Screening: Recommended annual dental exams for proper oral hygiene  Community Resource Referral / Chronic Care Management: CRR required this visit?  No   CCM required this visit?  No      Plan:     I have personally reviewed and noted the following in the patient's chart:   Medical and social history Use of alcohol, tobacco or illicit drugs  Current medications and supplements including opioid prescriptions. Patient is not currently taking opioid prescriptions. Functional ability and status Nutritional status Physical activity Advanced  directives List of other physicians Hospitalizations, surgeries, and ER visits in previous 12 months Vitals Screenings to include cognitive, depression, and falls Referrals and appointments  In addition, I have reviewed and discussed with patient certain preventive protocols, quality metrics, and best practice recommendations. A written personalized care plan for preventive services as well as general preventive health recommendations were provided to patient.     [place], [place]   07/13/2022

## 2022-07-19 ENCOUNTER — Ambulatory Visit: Payer: Medicare HMO | Admitting: Nurse Practitioner

## 2022-07-19 DIAGNOSIS — I10 Essential (primary) hypertension: Secondary | ICD-10-CM

## 2022-07-19 DIAGNOSIS — E782 Mixed hyperlipidemia: Secondary | ICD-10-CM

## 2022-07-19 DIAGNOSIS — E1159 Type 2 diabetes mellitus with other circulatory complications: Secondary | ICD-10-CM

## 2022-07-20 ENCOUNTER — Ambulatory Visit: Payer: Medicare HMO | Admitting: Nurse Practitioner

## 2022-07-20 ENCOUNTER — Ambulatory Visit: Payer: Medicare HMO | Admitting: Family Medicine

## 2022-07-20 ENCOUNTER — Encounter: Payer: Self-pay | Admitting: Nurse Practitioner

## 2022-07-20 VITALS — BP 136/84 | HR 66 | Ht 64.0 in | Wt 109.4 lb

## 2022-07-20 DIAGNOSIS — I10 Essential (primary) hypertension: Secondary | ICD-10-CM | POA: Diagnosis not present

## 2022-07-20 DIAGNOSIS — E1165 Type 2 diabetes mellitus with hyperglycemia: Secondary | ICD-10-CM

## 2022-07-20 DIAGNOSIS — E782 Mixed hyperlipidemia: Secondary | ICD-10-CM

## 2022-07-20 DIAGNOSIS — E1159 Type 2 diabetes mellitus with other circulatory complications: Secondary | ICD-10-CM

## 2022-07-20 MED ORDER — SITAGLIPTIN PHOSPHATE 100 MG PO TABS: 100.0000 mg | ORAL_TABLET | Freq: Every day | ORAL | 1 refills | Status: DC

## 2022-07-20 MED ORDER — TRESIBA FLEXTOUCH 100 UNIT/ML ~~LOC~~ SOPN: 14.0000 [IU] | PEN_INJECTOR | Freq: Every day | SUBCUTANEOUS | 3 refills | Status: DC

## 2022-07-20 NOTE — Progress Notes (Signed)
Endocrinology Follow Up Visit       07/20/2022, 4:35 PM   Subjective:    Patient ID: Jacob Barnes, male    DOB: 1940-06-10.  Jacob Barnes is being seen in follow up after being seen in consultation for management of currently uncontrolled symptomatic diabetes requested by  Jacob Barnes.   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   Anemia    Arthritis    hands   Carpal tunnel syndrome 08/05/2009   Qualifier: Diagnosis of  By: Romeo Apple Barnes, [name]     Chronic kidney disease    Diabetes mellitus    says since 1979 type 2   Diabetes mellitus without complication    Phreesia 04/03/2020   ERECTILE DYSFUNCTION, ORGANIC 01/20/2009   Qualifier: Diagnosis of  By: Lodema Hong Barnes, Margaret     GERD (gastroesophageal reflux disease)    Glaucoma    both eyes   History of kidney stones    Hyperlipemia    Hypertension    Iron deficiency anemia 01/10/2014   Other pancytopenia 01/10/2014   New in 01/2014, undergoing pathology review    Rash    front abdomen no drainage    Past Surgical History:  Procedure Laterality Date   AMPUTATION Right 09/01/2016   Procedure: AMPUTATION RIGHT MIDDLE FINGER TIP;  Surgeon: Kathryne Hitch, Barnes;  Location: [facility];  Service: Orthopedics;  Laterality: Right;   AMPUTATION Right 10/10/2016   Procedure: REPEAT IRRIGATION AND DEBRIDEMENT RIGHT MIDDLE FINGER WITH AMPUTATION THROUGH PROXIMAL PHALANX;  Surgeon: Kathryne Hitch, Barnes;  Location: [facility];  Service: Orthopedics;  Laterality: Right;   APPENDECTOMY     BIOPSY  01/13/2022   Procedure: BIOPSY;  Surgeon: Dolores Frame, Barnes;  Location: [facility];  Service: Gastroenterology;;   COLONOSCOPY  09/06/2011   Procedure: COLONOSCOPY;  Surgeon: Malissa Hippo, Barnes;  Location: [facility];  Service: Endoscopy;   Laterality: N/A;  830   COLONOSCOPY WITH PROPOFOL N/A 01/13/2022   Procedure: COLONOSCOPY WITH PROPOFOL;  Surgeon: Dolores Frame, Barnes;  Location: [facility];  Service: Gastroenterology;  Laterality: N/A;  200 ASA 3   CYSTOSCOPY/RETROGRADE/URETEROSCOPY/STONE EXTRACTION WITH BASKET     ESOPHAGOGASTRODUODENOSCOPY N/A 01/15/2014   Procedure: ESOPHAGOGASTRODUODENOSCOPY (EGD);  Surgeon: Malissa Hippo, Barnes;  Location: [facility];  Service: Endoscopy;  Laterality: N/A;  200   ESOPHAGOGASTRODUODENOSCOPY N/A 04/22/2014   Procedure: ESOPHAGOGASTRODUODENOSCOPY (EGD);  Surgeon: Malissa Hippo, Barnes;  Location: [facility];  Service: Endoscopy;  Laterality: N/A;  240   EYE SURGERY Bilateral yrs ago   ioc for cataract   IR ANGIO EXTERNAL CAROTID SEL EXT CAROTID UNI L MOD SED  03/22/2022   IR ANGIO INTRA EXTRACRAN SEL INTERNAL CAROTID UNI L MOD SED  03/26/2022   IR ANGIOGRAM FOLLOW UP STUDY  03/26/2022   IR NEURO EACH ADD'L AFTER BASIC UNI LEFT (MS)  03/26/2022   IR TRANSCATH/EMBOLIZ  03/26/2022   MALONEY DILATION N/A 01/15/2014   Procedure: Elease Hashimoto DILATION;  Surgeon: Malissa Hippo, Barnes;  [facility];  Service: Endoscopy;  Laterality: N/A;   MALONEY DILATION  04/22/2014   Procedure: Elease HashimotoMALONEY DILATION;  Surgeon: Malissa HippoNajeeb U [name], Barnes;  Location: [facility];  Service: Endoscopy;;   POLYPECTOMY  01/13/2022   Procedure: POLYPECTOMY;  Surgeon: Marguerita Merlesastaneda Mayorga, Reuel Boomaniel, Barnes;  Location: [facility];  Service: Gastroenterology;;   RADIOLOGY WITH ANESTHESIA Left 03/22/2022   Procedure: LEFT MMA EMBOLIZATION;  Surgeon: Lisbeth RenshawNundkumar, Neelesh, Barnes;  [facility];  Service: Radiology;  Laterality: Left;   surgery for left eye bleedf  2017    Social History   Socioeconomic History   Marital status: Married    Spouse name: Not on file   Number of children: Not on file   Years of education: Not on file   Highest education level: Not on file  Occupational History   Not on file  Tobacco  Use   Smoking status: Never    Passive exposure: Never   Smokeless tobacco: Never  Vaping Use   Vaping Use: Never used  Substance and Sexual Activity   Alcohol use: No   Drug use: No   Sexual activity: Yes  Other Topics Concern   Not on file  Social History Narrative   Not on file   Social Determinants of Health   Financial Resource Strain: Low Risk  (07/13/2022)   Overall Financial Resource Strain (CARDIA)    Difficulty of Paying Living Expenses: Not hard at all  Food Insecurity: No Food Insecurity (07/13/2022)   Hunger Vital Sign    Worried About Running Out of Food in the Last Year: Never true    Ran Out of Food in the Last Year: Never true  Transportation Needs: No Transportation Needs (07/13/2022)   PRAPARE - Administrator, Civil ServiceTransportation    Lack of Transportation (Medical): No    Lack of Transportation (Non-Medical): No  Physical Activity: Sufficiently Active (07/13/2022)   Exercise Vital Sign    Days of Exercise per Week: 4 days    Minutes of Exercise per Session: 60 min  Stress: No Stress Concern Present (07/13/2022)   [employer] - Occupational Stress Questionnaire    Feeling of Stress : Not at all  Social Connections: Socially Integrated (07/13/2022)   Social Connection and Isolation Panel [NHANES]    Frequency of Communication with Friends and Family: More than three times a week    Frequency of Social Gatherings with Friends and Family: Once a week    Attends Religious Services: More than 4 times per year    Active Member of [employer]ubs or Organizations: No    Attends Engineer, structuralClub or Organization Meetings: 1 to 4 times per year    Marital Status: Married    Family History  Problem Relation Age of Onset   Diabetes Mother    Hypertension Mother    Hyperlipidemia Mother    Stroke Mother    Heart failure Mother    Diabetes Father    Heart disease Father    Hypertension Father    Hyperlipidemia Father    Heart attack Father    Diabetes Brother    Diabetes Brother     Diabetes Son     Outpatient Encounter Medications as of 07/20/2022  Medication Sig   amLODipine (NORVASC) 5 MG tablet Take 1 tablet (5 mg total) by mouth daily.   atorvastatin (LIPITOR) 10 MG tablet GIVE 1 TABLET BY MOUTH AT BEDTIME EVERY MONDAY/WEDNESDAY/FRIDAY FOR HIGH CHOLESTEROL   blood glucose meter kit and supplies Dispense based on  patient and insurance preference. Use up to two times daily as directed. (FOR ICD-10 E10.9, E11.9).   donepezil (ARICEPT) 5 MG tablet TAKE 1 TABLET BY MOUTH EVERYDAY AT BEDTIME   dorzolamide-timolol (COSOPT) 2-0.5 % ophthalmic solution 1 drop 2 (two) times daily.   Insulin Pen Needle (PEN NEEDLES) 31G X 6 MM MISC 1 each by Does not apply route at bedtime.   latanoprost (XALATAN) 0.005 % ophthalmic solution 1 drop at bedtime.   levETIRAcetam (KEPPRA) 500 MG tablet Take 1 tablet (500 mg total) by mouth 2 (two) times daily.   [DISCONTINUED] insulin degludec (TRESIBA FLEXTOUCH) 100 UNIT/ML FlexTouch Pen Inject 10 Units into the skin at bedtime.   [DISCONTINUED] sitaGLIPtin (JANUVIA) 100 MG tablet GIVE 1 TABLET BY MOUTH DAILY FOR DIABETES   CALCIUM PO Take 1 tablet by mouth 3 (three) times a week. (Patient not taking: Reported on 07/20/2022)   cephALEXin (KEFLEX) 500 MG capsule Take 1 capsule (500 mg total) by mouth 3 (three) times daily. (Patient not taking: Reported on 07/20/2022)   cephALEXin (KEFLEX) 500 MG capsule Take 1 capsule (500 mg total) by mouth 4 (four) times daily. (Patient not taking: Reported on 07/20/2022)   insulin degludec (TRESIBA FLEXTOUCH) 100 UNIT/ML FlexTouch Pen Inject 14 Units into the skin at bedtime.   neomycin-polymyxin b-dexamethasone (MAXITROL) 3.5-10000-0.1 SUSP Place into the right eye. (Patient not taking: Reported on 07/20/2022)   rosuvastatin (CRESTOR) 5 MG tablet Take one tablet by mouth every Monday , Wednesday and Friday (Patient not taking: Reported on 07/20/2022)   sitaGLIPtin (JANUVIA) 100 MG tablet Take 1 tablet (100 mg total)  by mouth daily.   [DISCONTINUED] Insulin Glargine (BASAGLAR KWIKPEN) 100 UNIT/ML Inject 10 Units into the skin at bedtime. (Patient not taking: Reported on 07/20/2022)   No facility-administered encounter medications on file as of 07/20/2022.    ALLERGIES: Allergies  Allergen Reactions   Alpha-Gal      VACCINATION STATUS: Immunization History  Administered Date(s) Administered   Fluad Quad(high Dose 65+) 02/27/2019, 01/20/2021, 02/16/2022   Influenza,inj,Quad PF,6+ Mos 03/17/2013, 03/17/2014, 03/18/2015, 01/04/2016, 12/05/2016, 12/02/2019   Influenza-Unspecified 01/17/2017   Moderna Sars-Covid-2 Vaccination 06/23/2019, 07/21/2019, 02/12/2020   PPD Test 08/16/2010, 08/18/2010, 11/03/2013   Pneumococcal Conjugate-13 11/03/2013   Pneumococcal Polysaccharide-23 12/21/2008   Td 12/21/2008    Diabetes He presents for his follow-up diabetic visit. He has type 2 diabetes mellitus. Onset time: He was diagnosed at approximate age of 49 years. His disease course has been improving. There are no hypoglycemic associated symptoms. Pertinent negatives for hypoglycemia include no confusion, headaches, pallor or seizures. Associated symptoms include blurred vision and fatigue. Pertinent negatives for diabetes include no chest pain, no polydipsia, no polyphagia, no polyuria, no weakness and no weight loss. There are no hypoglycemic complications. Symptoms are stable. Diabetic complications include impotence, nephropathy, peripheral neuropathy and PVD. Risk factors for coronary artery disease include diabetes mellitus, dyslipidemia, hypertension, male sex and sedentary lifestyle. Current diabetic treatment includes oral agent (monotherapy) and insulin injections. He is compliant with treatment most of the time. His weight is fluctuating minimally. He is following a generally healthy diet. When asked about meal planning, he reported none. He has not had a previous visit with a dietitian. He never participates  in exercise. His home blood glucose trend is decreasing steadily. His overall blood glucose range is >200 mg/dl. (He presents today, accompanied by his daughter, with his CGM showing slightly above target glycemic profile.  His most recent A1c was 11% on 3/13.  He was  hospitalized for stroke between visits and has been in rehab facility as well.  His daughter is helping him manage his health.  Analysis of his CGM shows TIR 22%, TAR 78%, TBR 0% with a GMI of 8.7%.  His Tradjenta was changed by his PCP to Januvia (likely due to ins pref and cost).) An ACE inhibitor/angiotensin II receptor blocker is being taken. He does not see a podiatrist.Eye exam is current.  Hyperlipidemia This is a chronic problem. The current episode started more than 1 year ago. The problem is controlled. Recent lipid tests were reviewed and are normal. Exacerbating diseases include chronic renal disease and diabetes. There are no known factors aggravating his hyperlipidemia. Pertinent negatives include no chest pain, myalgias or shortness of breath. Current antihyperlipidemic treatment includes statins. The current treatment provides moderate improvement of lipids. Compliance problems include adherence to diet.  Risk factors for coronary artery disease include dyslipidemia, diabetes mellitus, hypertension, male sex, a sedentary lifestyle and family history.  Hypertension This is a chronic problem. The current episode started more than 1 year ago. The problem has been resolved since onset. The problem is controlled. Associated symptoms include blurred vision. Pertinent negatives include no chest pain, headaches, neck pain, palpitations or shortness of breath. There are no associated agents to hypertension. Risk factors for coronary artery disease include diabetes mellitus, dyslipidemia, male gender and sedentary lifestyle. Past treatments include ACE inhibitors. The current treatment provides moderate improvement. There are no compliance  problems.  Hypertensive end-organ damage includes kidney disease and PVD. Identifiable causes of hypertension include chronic renal disease.      Review of systems  Constitutional: + stable body weight,  current Body mass index is 18.78 kg/m. , no fatigue, no subjective hyperthermia, no subjective hypothermia, + decreased appetite Eyes: no blurry vision, no xerophthalmia ENT: no sore throat, no nodules palpated in throat, no dysphagia/odynophagia, no hoarseness Cardiovascular: no chest pain, no shortness of breath, no palpitations, no leg swelling Respiratory: no cough, no shortness of breath Gastrointestinal: no nausea/vomiting, + chronic diarrhea Musculoskeletal: no muscle/joint aches, reports multiple falls Skin: no rashes, no hyperemia Neurological: no tremors, no numbness, no tingling, no dizziness Psychiatric: no depression, no anxiety    Objective:    BP 136/84 (BP Location: Left Arm, Patient Position: Sitting, Cuff Size: Normal)   Pulse 66   Ht 5\' 4"  (1.626 m)   Wt 109 lb 6.4 oz (49.6 kg)   BMI 18.78 kg/m   Wt Readings from Last 3 Encounters:  07/20/22 109 lb 6.4 oz (49.6 kg)  07/13/22 113 lb (51.3 kg)  06/29/22 110 lb 1.3 oz (49.9 kg)    BP Readings from Last 3 Encounters:  07/20/22 136/84  06/29/22 113/63  06/21/22 (!) 154/82     Physical Exam- Limited  Constitutional:  Body mass index is 18.78 kg/m. , not in acute distress, normal state of mind Eyes:  EOMI, no exophthalmos Musculoskeletal: missing tips of some fingers, strength intact in all four extremities, no gross restriction of joint movements Skin:  no rashes, no hyperemia Neurological: no tremor with outstretched hands   Diabetic Foot Exam - Simple   No data filed     CMP ( most recent) CMP     Component Value Date/Time   NA 136 06/21/2022 1641   K 4.4 06/21/2022 1641   CL 101 06/21/2022 1641   CO2 26 06/21/2022 1641   GLUCOSE 243 (H) 06/21/2022 1641   GLUCOSE 125 (H) 03/21/2022 0243    BUN 12 06/21/2022  1641   CREATININE 1.39 (H) 06/21/2022 1641   CREATININE 1.36 (H) 09/30/2019 1008   CALCIUM 9.1 06/21/2022 1641   PROT 8.2 (H) 03/19/2022 1615   PROT 8.1 10/14/2021 0949   ALBUMIN 3.5 03/19/2022 1615   ALBUMIN 4.0 10/14/2021 0949   AST 25 03/19/2022 1615   ALT 16 03/19/2022 1615   ALKPHOS 101 03/19/2022 1615   BILITOT 0.4 03/19/2022 1615   BILITOT 0.4 10/14/2021 0949   GFRNONAA >60 03/21/2022 0243   GFRNONAA 49 (L) 09/30/2019 1008   GFRAA 59 (L) 03/31/2020 0848   GFRAA 57 (L) 09/30/2019 1008     Diabetic Labs (most recent): Lab Results  Component Value Date   HGBA1C 11.0 (H) 06/21/2022   HGBA1C 9.1 (A) 01/26/2022   HGBA1C 9.4 10/18/2021   MICROALBUR 150 07/14/2020   MICROALBUR 8.5 09/23/2018   MICROALBUR 127.6 (H) 06/20/2018     Lipid Panel ( most recent) Lipid Panel     Component Value Date/Time   CHOL 102 10/14/2021 0949   TRIG 40 10/14/2021 0949   HDL 51 10/14/2021 0949   CHOLHDL 2.0 10/14/2021 0949   CHOLHDL 2.6 09/30/2019 1008   VLDL 7 11/27/2016 1000   LDLCALC 40 10/14/2021 0949   LDLCALC 58 09/30/2019 1008   LABVLDL 11 10/14/2021 0949      Lab Results  Component Value Date   TSH 0.905 03/29/2021   TSH 1.300 03/31/2020   TSH 1.77 09/23/2018   TSH 0.61 07/19/2015   TSH 0.952 03/18/2015   TSH 0.921 12/11/2013   TSH 1.392 10/29/2012   TSH 0.615 09/26/2011   TSH 1.573 12/21/2008   FREET4 1.26 03/31/2020      Assessment & Plan:   1) Uncontrolled type 2 diabetes mellitus with hyperglycemia (HCC)  - Lemonte SHYLER HOLZMAN has currently uncontrolled symptomatic type 2 DM since  82 years of age.  He presents today, accompanied by his daughter, with his CGM showing slightly above target glycemic profile.  His most recent A1c was 11% on 3/13.  He was hospitalized for stroke between visits and has been in rehab facility as well.  His daughter is helping him manage his health.  Analysis of his CGM shows TIR 22%, TAR 78%, TBR 0% with a GMI of 8.7%.   His Tradjenta was changed by his PCP to Januvia (likely due to ins pref and cost).  Daughter will sometimes hold insulin injections if glucose is tighter in the evening in efforts to avoid hypoglycemia.   Recent labs reviewed.  - I had a long discussion with him about the progressive nature of diabetes and the pathology behind its complications. -his diabetes is complicated by peripheral arterial disease and he remains at a high risk for more acute and chronic complications which include CAD, CVA, CKD, retinopathy, and neuropathy. These are all discussed in detail with him.  - Nutritional counseling repeated at each appointment due to patients tendency to fall back in to old habits.  - The patient admits there is a room for improvement in their diet and drink choices. -  Suggestion is made for the patient to avoid simple carbohydrates from their diet including Cakes, Sweet Desserts / Pastries, Ice Cream, Soda (diet and regular), Sweet Tea, Candies, Chips, Cookies, Sweet Pastries, Store Bought Juices, Alcohol in Excess of 1-2 drinks a day, Artificial Sweeteners, Coffee Creamer, and "Sugar-free" Products. This will help patient to have stable blood glucose profile and potentially avoid unintended weight gain.   - I encouraged the patient to  switch to unprocessed or minimally processed complex starch and increased protein intake (animal or plant source), fruits, and vegetables.   - Patient is advised to stick to a routine mealtimes to eat 3 meals a day and avoid unnecessary snacks (to snack only to correct hypoglycemia).  -I did give some suggestions for plant based protein shakes as the whey protein in the products he has tried is known to cause some GI distress if sensitive to lactose.  - I have approached him with the following individualized plan to manage  his diabetes and patient agrees:   -In light of his advanced age and light body weight, avoiding hypoglycemia should be the number 1  priority in his care.   -He is advised to continue his prescribed amount of Tresiba 10 units SQ nightly and Januvia 100 mg po daily.    -He is encouraged to continue monitoring blood glucose at least twice daily (using his CGM), before breakfast and before bed, and to call the clinic if he has readings less than 70 or above 300 for 3 tests in a row.    - he is not a candidate for Metformin due to concurrent renal insufficiency.  - he is not a candidate for incretin therapy due to his body habitus.   - Specific targets for  A1c;  LDL, HDL,  and Triglycerides were discussed with the patient.  2) Blood Pressure /Hypertension:   his blood pressure is controlled to target for his age.   he is advised to continue his current medications including Benazepril 10 mg p.o. daily with breakfast .  3) Lipids/Hyperlipidemia:    His most recent lipid panel from 10/14/21 shows controlled LDL at 40.  He is advised to continue Crestor 5 mg po daily at bedtime.  Side effects and precautions discussed with him.  4)  Weight/Diet:  His Body mass index is 18.78 kg/m.   he is not a candidate for weight loss.   Exercise, and detailed carbohydrates information provided  -  detailed on discharge instructions.  5) Osteopenia: -He will not need bisphosphonate intervention at this time.  He is advised to continue on his Vitamin D 4000 units daily, as well as Calcium carbonate 600 mg p.o. daily.  6) Chronic Care/Health Maintenance: -he is on ACEI/ARB and Statin medications and is encouraged to initiate and continue to follow up with Ophthalmology, Dentist, Podiatrist at least yearly or according to recommendations, and advised to stay away from smoking. I have recommended yearly flu vaccine and pneumonia vaccine at least every 5 years; moderate intensity exercise for up to 150 minutes weekly; and  sleep for at least 7 hours a day.  - he is advised to maintain close follow up with Jacob Barnes for primary care  needs, as well as his other providers for optimal and coordinated care.      I spent  44  minutes in the care of the patient today including review of labs from CMP, Lipids, Thyroid Function, Hematology (current and previous including abstractions from other facilities); face-to-face time discussing  his blood glucose readings/logs, discussing hypoglycemia and hyperglycemia episodes and symptoms, medications doses, his options of short and long term treatment based on the latest standards of care / guidelines;  discussion about incorporating lifestyle medicine;  and documenting the encounter. Risk reduction counseling performed per USPSTF guidelines to reduce obesity and cardiovascular risk factors.     Please refer to Patient Instructions for Blood Glucose Monitoring and Insulin/Medications Dosing Guide"  in media tab for additional information. Please  also refer to " Patient Self Inventory" in the Media  tab for reviewed elements of pertinent patient history.  Jacob Barnes participated in the discussions, expressed understanding, and voiced agreement with the above plans.  All questions were answered to his satisfaction. he is encouraged to contact clinic should he have any questions or concerns prior to his return visit.   Follow up plan: - Return in about 3 months (around 10/19/2022) for Diabetes F/U with A1c in office, No previsit labs, Bring meter and logs.  Jacob Barnes, [facility] [address] [place], [name] [address] Phone: [phone] Fax: [phone]  07/20/2022, 4:35 PM

## 2022-07-25 ENCOUNTER — Other Ambulatory Visit: Payer: Self-pay | Admitting: Family Medicine

## 2022-08-14 ENCOUNTER — Other Ambulatory Visit (HOSPITAL_COMMUNITY): Payer: Self-pay | Admitting: Neurosurgery

## 2022-08-14 DIAGNOSIS — S065XAA Traumatic subdural hemorrhage with loss of consciousness status unknown, initial encounter: Secondary | ICD-10-CM

## 2022-08-23 ENCOUNTER — Other Ambulatory Visit: Payer: Self-pay | Admitting: Family Medicine

## 2022-08-24 ENCOUNTER — Encounter: Payer: Self-pay | Admitting: Family Medicine

## 2022-08-24 ENCOUNTER — Ambulatory Visit (INDEPENDENT_AMBULATORY_CARE_PROVIDER_SITE_OTHER): Payer: Medicare HMO | Admitting: Family Medicine

## 2022-08-24 ENCOUNTER — Ambulatory Visit: Payer: Medicare HMO | Admitting: Family Medicine

## 2022-08-24 VITALS — BP 116/65 | HR 69 | Ht 64.0 in | Wt 109.0 lb

## 2022-08-24 DIAGNOSIS — F03A0 Unspecified dementia, mild, without behavioral disturbance, psychotic disturbance, mood disturbance, and anxiety: Secondary | ICD-10-CM | POA: Diagnosis not present

## 2022-08-24 DIAGNOSIS — E559 Vitamin D deficiency, unspecified: Secondary | ICD-10-CM | POA: Diagnosis not present

## 2022-08-24 DIAGNOSIS — E785 Hyperlipidemia, unspecified: Secondary | ICD-10-CM | POA: Diagnosis not present

## 2022-08-24 DIAGNOSIS — Z794 Long term (current) use of insulin: Secondary | ICD-10-CM

## 2022-08-24 DIAGNOSIS — I10 Essential (primary) hypertension: Secondary | ICD-10-CM | POA: Diagnosis not present

## 2022-08-24 DIAGNOSIS — S065XAA Traumatic subdural hemorrhage with loss of consciousness status unknown, initial encounter: Secondary | ICD-10-CM

## 2022-08-24 DIAGNOSIS — E1165 Type 2 diabetes mellitus with hyperglycemia: Secondary | ICD-10-CM

## 2022-08-24 MED ORDER — DONEPEZIL HCL 10 MG PO TABS
10.0000 mg | ORAL_TABLET | Freq: Every day | ORAL | 3 refills | Status: DC
Start: 2022-09-16 — End: 2023-09-13

## 2022-08-24 NOTE — Assessment & Plan Note (Signed)
Increase aricept to treating dose of 10 mg daily

## 2022-08-24 NOTE — Assessment & Plan Note (Signed)
Jacob Barnes is reminded of the importance of commitment to daily physical activity for 30 minutes or more, as able and the need to limit carbohydrate intake to 30 to 60 grams per meal to help with blood sugar control.   The need to take medication as prescribed, test blood sugar as directed, and to call between visits if there is a concern that blood sugar is uncontrolled is also discussed.   Jacob Barnes is reminded of the importance of daily foot exam, annual eye examination, and good blood sugar, blood pressure and cholesterol control. Managed by Endo and has  upcoming appt     Latest Ref Rng & Units 06/21/2022    4:41 PM 03/21/2022    2:43 AM 03/20/2022    5:32 AM 03/19/2022    4:52 PM 03/19/2022    4:15 PM  Diabetic Labs  HbA1c 4.8 - 5.6 % 11.0       Creatinine 0.76 - 1.27 mg/dL 1.61  0.96  0.45  4.09  1.52       08/24/2022    9:01 AM 07/20/2022    3:11 PM 07/13/2022    9:41 AM 06/29/2022    1:50 PM 06/21/2022    4:11 PM 06/21/2022    4:00 PM 04/19/2022    1:54 PM  BP/Weight  Systolic BP 116 136  113 154 179 130  Diastolic BP 65 84  63 82 82 72  Wt. (Lbs) 109.04 109.4 113 110.08  111.12 114  BMI 18.72 kg/m2 18.78 kg/m2 19.4 kg/m2 18.9 kg/m2  19.07 kg/m2 19.57 kg/m2      Latest Ref Rng & Units 01/26/2022    2:00 PM 01/17/2022   12:00 AM  Foot/eye exam completion dates  Eye Exam No Retinopathy  Retinopathy      Foot Form Completion  Done      This result is from an external source.

## 2022-08-24 NOTE — Assessment & Plan Note (Signed)
Hyperlipidemia:Low fat diet discussed and encouraged.   Lipid Panel  Lab Results  Component Value Date   CHOL 102 10/14/2021   HDL 51 10/14/2021   LDLCALC 40 10/14/2021   TRIG 40 10/14/2021   CHOLHDL 2.0 10/14/2021     Updated lab needed

## 2022-08-24 NOTE — Progress Notes (Signed)
Jacob Barnes     MRN: [id number]      DOB: 08-27-1940  Chief Complaint  Patient presents with   Follow-up    Sore on the inside of his nose    HPI [name] is here for follow up and re-evaluation of chronic medical conditions, medication management and review of any available recent lab and radiology data.   Questions or concerns regarding consultations or procedures which the PT has had in the interim are  addressed.Recently had an eye exam The PT denies any adverse reactions to current medications since the last visit.  Soreness and redness of ledft nostril x 1 wewek states bitten by a fly, daughter has been applying neosporin ,  jhas keflex remaining but he resists taking this Blood sugar still fluyctuating  ROS Denies recent fever or chills. Denies sinus pressure, nasal congestion, ear pain or sore throat. Denies chest congestion, productive cough or wheezing. Denies chest pains, palpitations and leg swelling Denies abdominal pain, nausea, vomiting,diarrhea or constipation.   Denies dysuria, frequency, hesitancy or incontinence. Chronic  joint pain,  and limitation in mobility. Denies headaches, seizures, numbness, or tingling. Denies depression, anxiety or insomnia.   PE  BP 116/65 (BP Location: Right Arm, Patient Position: Sitting, Cuff Size: Normal)   Pulse 69   Ht 5\' 4"  (1.626 m)   Wt 109 lb 0.6 oz (49.5 kg)   SpO2 95%   BMI 18.72 kg/m   Patient alert and oriented and in no cardiopulmonary distress.  HEENT: No facial asymmetry, EOMI,     Neck supple .  Chest: Clear to auscultation bilaterally.  CVS: S1, S2 no murmurs, no S3.Regular rate.  ABD: Soft non tender.   Ext: No edema  MS: Adequate  though reduced ROM spine, shoulders, hips and knees.  Skin: Intact, no ulcerations or rash noted.  Psych: Good eye contact, normal affect. t not anxious or depressed appearing.  CNS: CN 2-12 intact, power,  normal throughout.no focal deficits noted.   Assessment  & Plan  Dementia (HCC) Increase aricept to treating dose of 10 mg daily  Hyperlipidemia LDL goal <100 Hyperlipidemia:Low fat diet discussed and encouraged.   Lipid Panel  Lab Results  Component Value Date   CHOL 102 10/14/2021   HDL 51 10/14/2021   LDLCALC 40 10/14/2021   TRIG 40 10/14/2021   CHOLHDL 2.0 10/14/2021     Updated lab needed   Subdural hematoma (HCC) Being monitored and is reducing in size   Uncontrolled type 2 diabetes mellitus with hyperglycemia [facility]) [name] is reminded of the importance of commitment to daily physical activity for 30 minutes or more, as able and the need to limit carbohydrate intake to 30 to 60 grams per meal to help with blood sugar control.   The need to take medication as prescribed, test blood sugar as directed, and to call between visits if there is a concern that blood sugar is uncontrolled is also discussed.   [name] is reminded of the importance of daily foot exam, annual eye examination, and good blood sugar, blood pressure and cholesterol control. Managed by Endo and has  upcoming appt     Latest Ref Rng & Units 06/21/2022    4:41 PM 03/21/2022    2:43 AM 03/20/2022    5:32 AM 03/19/2022    4:52 PM 03/19/2022    4:15 PM  Diabetic Labs  HbA1c 4.8 - 5.6 % 11.0       Creatinine 0.76 - 1.27  mg/dL 0.98  1.19  1.47  8.29  1.52       08/24/2022    9:01 AM 07/20/2022    3:11 PM 07/13/2022    9:41 AM 06/29/2022    1:50 PM 06/21/2022    4:11 PM 06/21/2022    4:00 PM 04/19/2022    1:54 PM  BP/Weight  Systolic BP 116 136  113 154 179 130  Diastolic BP 65 84  63 82 82 72  Wt. (Lbs) 109.04 109.4 113 110.08  111.12 114  BMI 18.72 kg/m2 18.78 kg/m2 19.4 kg/m2 18.9 kg/m2  19.07 kg/m2 19.57 kg/m2      Latest Ref Rng & Units 01/26/2022    2:00 PM 01/17/2022   12:00 AM  Foot/eye exam completion dates  Eye Exam No Retinopathy  Retinopathy      Foot Form Completion  Done      This result is from an external source.

## 2022-08-24 NOTE — Patient Instructions (Addendum)
Annual exam end August call if you need me sooner    Blood pressure excellent  Dose increase of aricept to treating dose of 10 mg daily when you next  fill in June  Use bactroban ointment twice daily top sore inside of nose, and take the antibiotic that tyou have keflex one every day until they are done. Do NOT pick your nose   Labs today, lipid, cmp and EGFr,  vit D and TSH  Thanks for choosing [facility], we consider it a privelige to serve you.  HAPPY BIRTHDAY, and MANY MORE!!

## 2022-08-24 NOTE — Assessment & Plan Note (Signed)
Being monitored and is reducing in size

## 2022-08-25 LAB — LIPID PANEL
Chol/HDL Ratio: 1.8 ratio (ref 0.0–5.0)
Cholesterol, Total: 70 mg/dL — ABNORMAL LOW (ref 100–199)
HDL: 39 mg/dL — ABNORMAL LOW (ref >39)
LDL Chol Calc (NIH): 19 mg/dL (ref 0–99)
Triglycerides: 43 mg/dL (ref 0–149)
VLDL Cholesterol Cal: 12 mg/dL (ref 5–40)

## 2022-08-25 LAB — CMP14+EGFR
ALT: 10 IU/L (ref 0–44)
AST: 36 IU/L (ref 0–40)
Albumin/Globulin Ratio: 0.8 — ABNORMAL LOW (ref 1.2–2.2)
Albumin: 3.9 g/dL (ref 3.7–4.7)
Alkaline Phosphatase: 82 IU/L (ref 44–121)
BUN/Creatinine Ratio: 10 (ref 10–24)
BUN: 13 mg/dL (ref 8–27)
Bilirubin Total: 0.3 mg/dL (ref 0.0–1.2)
CO2: 21 mmol/L (ref 20–29)
Calcium: 9 mg/dL (ref 8.6–10.2)
Chloride: 101 mmol/L (ref 96–106)
Creatinine, Ser: 1.34 mg/dL — ABNORMAL HIGH (ref 0.76–1.27)
Globulin, Total: 4.6 g/dL — ABNORMAL HIGH (ref 1.5–4.5)
Glucose: 142 mg/dL — ABNORMAL HIGH (ref 70–99)
Potassium: 5 mmol/L (ref 3.5–5.2)
Sodium: 138 mmol/L (ref 134–144)
Total Protein: 8.5 g/dL (ref 6.0–8.5)
eGFR: 53 mL/min/{1.73_m2} — ABNORMAL LOW (ref >59)

## 2022-08-25 LAB — VITAMIN D 25 HYDROXY (VIT D DEFICIENCY, FRACTURES): Vit D, 25-Hydroxy: 15.9 ng/mL — ABNORMAL LOW (ref 30.0–100.0)

## 2022-08-25 LAB — TSH: TSH: 2.35 u[IU]/mL (ref 0.450–4.500)

## 2022-08-26 MED ORDER — VITAMIN D (ERGOCALCIFEROL) 1.25 MG (50000 UNIT) PO CAPS: 50000.0000 [IU] | ORAL_CAPSULE | ORAL | 8 refills | Status: DC

## 2022-08-26 NOTE — Addendum Note (Signed)
Addended by: Kerri Perches on: 08/26/2022 05:47 PM   Modules accepted: Orders

## 2022-08-29 ENCOUNTER — Telehealth: Payer: Self-pay | Admitting: Nurse Practitioner

## 2022-08-29 ENCOUNTER — Telehealth: Payer: Self-pay | Admitting: Family Medicine

## 2022-08-29 NOTE — Telephone Encounter (Signed)
Son [name] called ion on patient behalf. Wants a call back has a few questions in regard to patients last visit

## 2022-08-29 NOTE — Telephone Encounter (Signed)
Spoke to patient's son  

## 2022-08-29 NOTE — Telephone Encounter (Signed)
Not sure where I sent it previously.  I see it in [employer] system but says it was canceled?  I sent message for refill through them anyways.

## 2022-08-29 NOTE — Telephone Encounter (Signed)
Pt's daughter called and said that she needs a refill on his [employer] 2. She said you were know where to send those too.

## 2022-09-11 ENCOUNTER — Telehealth: Payer: Self-pay | Admitting: Nurse Practitioner

## 2022-09-11 NOTE — Telephone Encounter (Signed)
Do you have a PA for this pt for the [place] sensors?

## 2022-09-12 ENCOUNTER — Other Ambulatory Visit (HOSPITAL_COMMUNITY): Payer: Self-pay

## 2022-09-12 ENCOUNTER — Telehealth: Payer: Self-pay | Admitting: Nurse Practitioner

## 2022-09-12 MED ORDER — FREESTYLE LIBRE 2 SENSOR MISC: 3 refills | Status: DC

## 2022-09-12 NOTE — Telephone Encounter (Signed)
Daughter called and said they are still waiting on PA, I sent a note to the PA team and haven't gotten a response.  Any ideas?

## 2022-09-12 NOTE — Telephone Encounter (Signed)
So does the Rx need to be sent somewhere else or is this through [employer] too?

## 2022-09-12 NOTE — Telephone Encounter (Signed)
Called daughter to relay Whitney's note.  Pts daughter did not have vm set up.

## 2022-09-12 NOTE — Telephone Encounter (Signed)
I just sent another message through [employer] to check on sensor refill (sent one previously), I see I previously sent the order there for fulfillment but it does note it was canceled.  I do not see anything in Epic that says a PA has been started.  I will escalate to [facility] with [employer] if I do not get a response in a timely manner.

## 2022-09-12 NOTE — Telephone Encounter (Signed)
Well, I sent it to their local pharmacy so I guess we will find out.  I havent heard anything back from [employer], so maybe he was getting it from the pharmacy to begin with.

## 2022-09-18 ENCOUNTER — Telehealth: Payer: Self-pay | Admitting: Family Medicine

## 2022-09-18 NOTE — Telephone Encounter (Signed)
Wants a call back. Looking to have patient toenails cut in [place] instead of [place].

## 2022-09-19 ENCOUNTER — Other Ambulatory Visit: Payer: Self-pay | Admitting: Family Medicine

## 2022-09-19 DIAGNOSIS — E1159 Type 2 diabetes mellitus with other circulatory complications: Secondary | ICD-10-CM

## 2022-09-19 NOTE — Telephone Encounter (Signed)
Referral sent for [facility] podiatrist

## 2022-09-25 ENCOUNTER — Ambulatory Visit (INDEPENDENT_AMBULATORY_CARE_PROVIDER_SITE_OTHER): Payer: Medicare HMO | Admitting: Gastroenterology

## 2022-09-25 ENCOUNTER — Encounter (INDEPENDENT_AMBULATORY_CARE_PROVIDER_SITE_OTHER): Payer: Self-pay | Admitting: Gastroenterology

## 2022-09-25 VITALS — BP 127/63 | HR 78 | Temp 97.5°F | Ht 65.0 in | Wt 110.6 lb

## 2022-09-25 DIAGNOSIS — Z91018 Allergy to other foods: Secondary | ICD-10-CM

## 2022-09-25 DIAGNOSIS — R634 Abnormal weight loss: Secondary | ICD-10-CM

## 2022-09-25 DIAGNOSIS — K529 Noninfective gastroenteritis and colitis, unspecified: Secondary | ICD-10-CM

## 2022-09-25 DIAGNOSIS — E441 Mild protein-calorie malnutrition: Secondary | ICD-10-CM | POA: Diagnosis not present

## 2022-09-25 NOTE — Patient Instructions (Signed)
Start sugar free Ensure/Boost once at night Continue with Imodium as needed for diarrhea Avoid red meat (pork, beef, lamb)

## 2022-09-25 NOTE — Progress Notes (Signed)
Jacob Barnes, M.D. Gastroenterology & Hepatology [facility] Gastroenterology [address] [place], [name] [address]  Primary Care Physician: Jacob Perches, MD [address] [name] [address]  I will communicate my assessment and recommendations to the referring MD via EMR.  Problems: Alpha gal intolerance  History of Present Illness: Jacob Barnes is a 82 y.o. male with past medical history arthritis, carpal tunnel, DM, ED, GERD, glaucoma, HLD, HTN, IDA, pancytopenia, hx gastric ulcers, who presents for follow up of chronic diarrhea due to alpha gal.  The patient was last seen on 03/13/2022. At that time, the patient was advised to avoid red meat and pork.  He was advised to continue Imodium as needed.  Daughter comes to the appointment with Jacob Barnes.  She has provided some of the medical information.  Patient reports that 1-2 times a month he presents episodes of diarrhea. He takes Imodium when this happens and the diarrhea subsides. He may have a couple episodes of diarrhea but it is much better compared to what it was.  Overall, they feel that he has presented control of his symptoms and is doing quite well.  The patient denies having any nausea, vomiting, fever, chills, hematochezia, melena, hematemesis, abdominal distention, abdominal pain, diarrhea, jaundice, pruritus . Lost 4 lb since the last appointment, which has been somewhat concerning for his daughter.  Last Colonoscopy:10/2023Three 3 to 6 mm polyps in the rectum, in the descending colon and in the transverse - The rest of the examined colon is normal. Biopsied. - The distal rectum and anal verge are normal on retroflexion view. 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   Anemia    Arthritis    hands    Carpal tunnel syndrome 08/05/2009   Qualifier: Diagnosis of  By: Romeo Apple MD, [name]     Chronic kidney disease    Diabetes mellitus    says since 1979 type 2   Diabetes mellitus without complication (HCC)    Phreesia 04/03/2020   ERECTILE DYSFUNCTION, ORGANIC 01/20/2009   Qualifier: Diagnosis of  By: Lodema Hong MD, Margaret     GERD (gastroesophageal reflux disease)    Glaucoma    both eyes   History of kidney stones    Hyperlipemia    Hypertension    Iron deficiency anemia 01/10/2014   Other pancytopenia (HCC) 01/10/2014   New in 01/2014, undergoing pathology review    Rash    front abdomen no drainage    Past Surgical History: Past Surgical History:  Procedure Laterality Date   AMPUTATION Right 09/01/2016   Procedure: AMPUTATION RIGHT MIDDLE FINGER TIP;  Surgeon: Kathryne Hitch, MD;  Location: [facility] OR[facility];  Service: Orthopedics;  Laterality: Right;   AMPUTATION Right 10/10/2016   Procedure: REPEAT IRRIGATION AND DEBRIDEMENT RIGHT MIDDLE FINGER WITH AMPUTATION THROUGH PROXIMAL PHALANX;  Surgeon: Kathryne Hitch, MD;  Location: [facility] OR;  Service: Orthopedics;  Laterality: Right;   APPENDECTOMY     BIOPSY  01/13/2022   Procedure: BIOPSY;  Surgeon: Dolores Frame, MD;  Location: [facility];  Service: Gastroenterology;;   COLONOSCOPY  09/06/2011   Procedure: COLONOSCOPY;  Surgeon: Malissa Hippo, MD;  Location: [facility];  Service: Endoscopy;  Laterality: N/A;  830   COLONOSCOPY  WITH PROPOFOL N/A 01/13/2022   Procedure: COLONOSCOPY WITH PROPOFOL;  Surgeon: Dolores Frame, MD;  Location: [facility];  Service: Gastroenterology;  Laterality: N/A;  200 ASA 3   CYSTOSCOPY/RETROGRADE/URETEROSCOPY/STONE EXTRACTION WITH BASKET     ESOPHAGOGASTRODUODENOSCOPY N/A 01/15/2014   Procedure: ESOPHAGOGASTRODUODENOSCOPY (EGD);  Surgeon: Malissa Hippo, MD;  [facility]: [facility];  Service: Endoscopy;  Laterality: N/A;  200   ESOPHAGOGASTRODUODENOSCOPY N/A  04/22/2014   Procedure: ESOPHAGOGASTRODUODENOSCOPY (EGD);  Surgeon: Malissa Hippo, MD;  [facility]: [facility];  Service: Endoscopy;  Laterality: N/A;  240   EYE SURGERY Bilateral yrs ago   ioc for cataract   IR ANGIO EXTERNAL CAROTID SEL EXT CAROTID UNI L MOD SED  03/22/2022   IR ANGIO INTRA EXTRACRAN SEL INTERNAL CAROTID UNI L MOD SED  03/26/2022   IR ANGIOGRAM FOLLOW UP STUDY  03/26/2022   IR NEURO EACH ADD'L AFTER BASIC UNI LEFT (MS)  03/26/2022   IR TRANSCATH/EMBOLIZ  03/26/2022   MALONEY DILATION N/A 01/15/2014   Procedure: Elease Hashimoto DILATION;  Surgeon: Malissa Hippo, MD;  [facility];  Service: Endoscopy;  Laterality: N/A;   MALONEY DILATION  04/22/2014   Procedure: Elease Hashimoto DILATION;  Surgeon: Malissa Hippo, MD;  [facility]: [facility];  Service: Endoscopy;;   POLYPECTOMY  01/13/2022   Procedure: POLYPECTOMY;  Surgeon: Marguerita Merles, Reuel Boom, MD;  Location: [facility];  Service: Gastroenterology;;   RADIOLOGY WITH ANESTHESIA Left 03/22/2022   Procedure: LEFT MMA EMBOLIZATION;  Surgeon: Lisbeth Renshaw, MD;  Location[facility] OR;  Service: Radiology;  Laterality: Left;   surgery for left eye bleedf  2017    Family History: Family History  Problem Relation Age of Onset   Diabetes Mother    Hypertension Mother    Hyperlipidemia Mother    Stroke Mother    Heart failure Mother    Diabetes Father    Heart disease Father    Hypertension Father    Hyperlipidemia Father    Heart attack Father    Diabetes Brother    Diabetes Brother    Diabetes Son     Social History: Social History   Tobacco Use  Smoking Status Never   Passive exposure: Never  Smokeless Tobacco Never   Social History   Substance and Sexual Activity  Alcohol Use No   Social History   Substance and Sexual Activity  Drug Use No    Allergies: Allergies  Allergen Reactions   Alpha-Gal     Medications: Current Outpatient Medications  Medication Sig Dispense Refill    amLODipine (NORVASC) 5 MG tablet Take 1 tablet (5 mg total) by mouth daily. 30 tablet 5   atorvastatin (LIPITOR) 10 MG tablet GIVE 1 TABLET BY MOUTH AT BEDTIME EVERY MONDAY/WEDNESDAY/FRIDAY FOR HIGH CHOLESTEROL 45 tablet 1   blood glucose meter kit and supplies Dispense based on patient and insurance preference. Use up to two times daily as directed. (FOR ICD-10 E10.9, E11.9). 1 each 0   Continuous Glucose Sensor (FREESTYLE LIBRE 2 SENSOR) MISC Change sensor every 14 days 6 each 3   donepezil (ARICEPT) 10 MG tablet Take 1 tablet (10 mg total) by mouth at bedtime. 90 tablet 3   dorzolamide-timolol (COSOPT) 2-0.5 % ophthalmic solution 1 drop 2 (two) times daily.     insulin degludec (TRESIBA FLEXTOUCH) 100 UNIT/ML FlexTouch Pen Inject 14 Units into the skin at bedtime. 12 mL 3   Insulin Pen Needle (PEN NEEDLES) 31G X 6 MM MISC 1 each by  Does not apply route at bedtime. 100 each 2   latanoprost (XALATAN) 0.005 % ophthalmic solution 1 drop at bedtime.     levETIRAcetam (KEPPRA) 500 MG tablet Take 1 tablet (500 mg total) by mouth 2 (two) times daily. 60 tablet 5   sitaGLIPtin (JANUVIA) 100 MG tablet Take 1 tablet (100 mg total) by mouth daily. 90 tablet 1   Vitamin D, Ergocalciferol, (DRISDOL) 1.25 MG (50000 UNIT) CAPS capsule Take 1 capsule (50,000 Units total) by mouth every 7 (seven) days. 5 capsule 8   No current facility-administered medications for this visit.    Review of Systems: GENERAL: negative for malaise, night sweats HEENT: No changes in hearing or vision, no nose bleeds or other nasal problems. NECK: Negative for lumps, goiter, pain and significant neck swelling RESPIRATORY: Negative for cough, wheezing CARDIOVASCULAR: Negative for chest pain, leg swelling, palpitations, orthopnea GI: SEE HPI MUSCULOSKELETAL: Negative for joint pain or swelling, back pain, and muscle pain. SKIN: Negative for lesions, rash PSYCH: Negative for sleep disturbance, mood disorder and recent psychosocial  stressors. HEMATOLOGY Negative for prolonged bleeding, bruising easily, and swollen nodes. ENDOCRINE: Negative for cold or heat intolerance, polyuria, polydipsia and goiter. NEURO: negative for tremor, gait imbalance, syncope and seizures. The remainder of the review of systems is noncontributory.   Physical Exam: BP 127/63 (BP Location: Left Arm, Patient Position: Sitting, Cuff Size: Normal)   Pulse 78   Temp (!) 97.5 F (36.4 C) (Temporal)   Ht 5\' 5"  (1.651 m)   Wt 110 lb 9.6 oz (50.2 kg)   BMI 18.40 kg/m  GENERAL: The patient is AO x3, in no acute distress. Underweight. HEENT: Head is normocephalic and atraumatic. EOMI are intact. Mouth is well hydrated and without lesions. NECK: Supple. No masses LUNGS: Clear to auscultation. No presence of rhonchi/wheezing/rales. Adequate chest expansion HEART: RRR, normal s1 and s2. ABDOMEN: Soft, nontender, no guarding, no peritoneal signs, and nondistended. BS +. No masses. EXTREMITIES: Without any cyanosis, clubbing, rash, lesions or edema. NEUROLOGIC: AOx3, no focal motor deficit. SKIN: no jaundice, no rashes  Imaging/Labs: as above  I personally reviewed and interpreted the available labs, imaging and endoscopic files.  Impression and Plan: Jacob Barnes is a 82 y.o. male with past medical history arthritis, carpal tunnel, DM, ED, GERD, glaucoma, HLD, HTN, IDA, pancytopenia, hx gastric ulcers, who presents for follow up of chronic diarrhea due to alpha gal.  Patient has presented significant improvement of his symptoms while avoiding red meat and pork.  I encouraged him to continue doing this.  He can take Imodium as needed for breakthrough episodes of diarrhea for now.  He has presented some progressive weight loss for the last few months.  It is unclear why he has presented this as his appetite is intact.  I advised the daughter to restart protein shakes on a daily basis.  - Start sugar free Ensure/Boost once at night - Continue with  Imodium as needed for diarrhea - Avoid red meat (pork, beef, lamb)  All questions were answered.      Jacob Blazing, MD Gastroenterology and Hepatology [facility] Gastroenterology

## 2022-10-03 ENCOUNTER — Ambulatory Visit (HOSPITAL_COMMUNITY): Payer: Medicare HMO

## 2022-10-03 ENCOUNTER — Encounter (HOSPITAL_COMMUNITY): Payer: Self-pay

## 2022-10-05 ENCOUNTER — Ambulatory Visit (HOSPITAL_COMMUNITY)
Admission: RE | Admit: 2022-10-05 | Discharge: 2022-10-05 | Disposition: A | Discharge status: Home / Self Care | Payer: Medicare HMO | Source: Ambulatory Visit | Attending: Neurosurgery | Admitting: Neurosurgery

## 2022-10-05 DIAGNOSIS — S065XAA Traumatic subdural hemorrhage with loss of consciousness status unknown, initial encounter: Secondary | ICD-10-CM | POA: Insufficient documentation

## 2022-10-31 ENCOUNTER — Ambulatory Visit: Payer: Medicare HMO | Admitting: Nurse Practitioner

## 2022-10-31 ENCOUNTER — Encounter: Payer: Self-pay | Admitting: Nurse Practitioner

## 2022-10-31 VITALS — BP 127/72 | HR 65 | Ht 65.0 in | Wt 108.4 lb

## 2022-10-31 DIAGNOSIS — E782 Mixed hyperlipidemia: Secondary | ICD-10-CM

## 2022-10-31 DIAGNOSIS — E1159 Type 2 diabetes mellitus with other circulatory complications: Secondary | ICD-10-CM

## 2022-10-31 DIAGNOSIS — Z7984 Long term (current) use of oral hypoglycemic drugs: Secondary | ICD-10-CM

## 2022-10-31 DIAGNOSIS — Z794 Long term (current) use of insulin: Secondary | ICD-10-CM

## 2022-10-31 DIAGNOSIS — I10 Essential (primary) hypertension: Secondary | ICD-10-CM | POA: Diagnosis not present

## 2022-10-31 LAB — POCT GLYCOSYLATED HEMOGLOBIN (HGB A1C): Hemoglobin A1C: 8.8 % — AB (ref 4.0–5.6)

## 2022-10-31 MED ORDER — SITAGLIPTIN PHOSPHATE 100 MG PO TABS: 100.0000 mg | ORAL_TABLET | Freq: Every day | ORAL | 1 refills | Status: DC

## 2022-10-31 NOTE — Progress Notes (Signed)
Endocrinology Follow Up Visit       10/31/2022, 4:08 PM   Subjective:    Patient ID: Jacob Barnes, male    DOB: 03/13/1941.  Jacob Barnes is being seen in follow up after being seen in consultation for management of currently uncontrolled symptomatic diabetes requested by  Kerri Perches, MD.   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   Anemia    Arthritis    hands   Carpal tunnel syndrome 08/05/2009   Qualifier: Diagnosis of  By: Romeo Apple MD, [name]     Chronic kidney disease    Diabetes mellitus    says since 1979 type 2   Diabetes mellitus without complication (HCC)    Phreesia 04/03/2020   ERECTILE DYSFUNCTION, ORGANIC 01/20/2009   Qualifier: Diagnosis of  By: Lodema Hong MD, Margaret     GERD (gastroesophageal reflux disease)    Glaucoma    both eyes   History of kidney stones    Hyperlipemia    Hypertension    Iron deficiency anemia 01/10/2014   Other pancytopenia (HCC) 01/10/2014   New in 01/2014, undergoing pathology review    Rash    front abdomen no drainage    Past Surgical History:  Procedure Laterality Date   AMPUTATION Right 09/01/2016   Procedure: AMPUTATION RIGHT MIDDLE FINGER TIP;  Surgeon: Kathryne Hitch, MD;  Location: [facility];  Service: Orthopedics;  Laterality: Right;   AMPUTATION Right 10/10/2016   Procedure: REPEAT IRRIGATION AND DEBRIDEMENT RIGHT MIDDLE FINGER WITH AMPUTATION THROUGH PROXIMAL PHALANX;  Surgeon: Kathryne Hitch, MD;  Location: [facility];  Service: Orthopedics;  Laterality: Right;   APPENDECTOMY     BIOPSY  01/13/2022   Procedure: BIOPSY;  Surgeon: Dolores Frame, MD;  Location: [facility];  Service: Gastroenterology;;   COLONOSCOPY  09/06/2011   Procedure: COLONOSCOPY;  Surgeon: Malissa Hippo, MD;  Location: [facility];  Service:  Endoscopy;  Laterality: N/A;  830   COLONOSCOPY WITH PROPOFOL N/A 01/13/2022   Procedure: COLONOSCOPY WITH PROPOFOL;  Surgeon: Dolores Frame, MD;  Location: [facility];  Service: Gastroenterology;  Laterality: N/A;  200 ASA 3   CYSTOSCOPY/RETROGRADE/URETEROSCOPY/STONE EXTRACTION WITH BASKET     ESOPHAGOGASTRODUODENOSCOPY N/A 01/15/2014   Procedure: ESOPHAGOGASTRODUODENOSCOPY (EGD);  Surgeon: Malissa Hippo, MD;  Location: [facility];  Service: Endoscopy;  Laterality: N/A;  200   ESOPHAGOGASTRODUODENOSCOPY N/A 04/22/2014   Procedure: ESOPHAGOGASTRODUODENOSCOPY (EGD);  Surgeon: Malissa Hippo, MD;  Location: [facility];  Service: Endoscopy;  Laterality: N/A;  240   EYE SURGERY Bilateral yrs ago   ioc for cataract   IR ANGIO EXTERNAL CAROTID SEL EXT CAROTID UNI L MOD SED  03/22/2022   IR ANGIO INTRA EXTRACRAN SEL INTERNAL CAROTID UNI L MOD SED  03/26/2022   IR ANGIOGRAM FOLLOW UP STUDY  03/26/2022   IR NEURO EACH ADD'L AFTER BASIC UNI LEFT (MS)  03/26/2022   IR TRANSCATH/EMBOLIZ  03/26/2022   MALONEY DILATION N/A 01/15/2014   Procedure: Elease Hashimoto DILATION;  Surgeon: Malissa Hippo, MD;  [facility]: [facility];  Service: Endoscopy;  Laterality: N/A;   MALONEY DILATION  04/22/2014   Procedure: Elease Hashimoto DILATION;  Surgeon: Malissa Hippo, MD;  Location: [facility];  Service: Endoscopy;;   POLYPECTOMY  01/13/2022   Procedure: POLYPECTOMY;  Surgeon: Marguerita Merles, Reuel Boom, MD;  Location: [facility];  Service: Gastroenterology;;   RADIOLOGY WITH ANESTHESIA Left 03/22/2022   Procedure: LEFT MMA EMBOLIZATION;  Surgeon: Lisbeth Renshaw, MD;  Location[facility];  Service: Radiology;  Laterality: Left;   surgery for left eye bleedf  2017    Social History   Socioeconomic History   Marital status: Married    Spouse name: Not on file   Number of children: Not on file   Years of education: Not on file   Highest education level: Not on file  Occupational History   Not on  file  Tobacco Use   Smoking status: Never    Passive exposure: Never   Smokeless tobacco: Never  Vaping Use   Vaping status: Never Used  Substance and Sexual Activity   Alcohol use: No   Drug use: No   Sexual activity: Yes  Other Topics Concern   Not on file  Social History Narrative   Not on file   Social Determinants of Health   Financial Resource Strain: Low Risk  (07/13/2022)   Overall Financial Resource Strain (CARDIA)    Difficulty of Paying Living Expenses: Not hard at all  Food Insecurity: No Food Insecurity (07/13/2022)   Hunger Vital Sign    Worried About Running Out of Food in the Last Year: Never true    Ran Out of Food in the Last Year: Never true  Transportation Needs: No Transportation Needs (07/13/2022)   PRAPARE - Administrator, Civil Service (Medical): No    Lack of Transportation (Non-Medical): No  Physical Activity: Sufficiently Active (07/13/2022)   Exercise Vital Sign    Days of Exercise per Week: 4 days    Minutes of Exercise per Session: 60 min  Stress: No Stress Concern Present (07/13/2022)   [employer] of Occupational Health - Occupational Stress Questionnaire    Feeling of Stress : Not at all  Social Connections: Socially Integrated (07/13/2022)   Social Connection and Isolation Panel [NHANES]    Frequency of Communication with Friends and Family: More than three times a week    Frequency of Social Gatherings with Friends and Family: Once a week    Attends Religious Services: More than 4 times per year    Active Member of [employer] or Organizations: No    Attends Engineer, structural: 1 to 4 times per year    Marital Status: Married    Family History  Problem Relation Age of Onset   Diabetes Mother    Hypertension Mother    Hyperlipidemia Mother    Stroke Mother    Heart failure Mother    Diabetes Father    Heart disease Father    Hypertension Father    Hyperlipidemia Father    Heart attack Father    Diabetes Brother     Diabetes Brother    Diabetes Son     Outpatient Encounter Medications as of 10/31/2022  Medication Sig   amLODipine (NORVASC) 5 MG tablet Take 1 tablet (5 mg total) by mouth daily.   atorvastatin (LIPITOR) 10 MG tablet GIVE 1 TABLET BY MOUTH AT BEDTIME EVERY MONDAY/WEDNESDAY/FRIDAY FOR HIGH CHOLESTEROL   blood glucose meter kit and supplies Dispense based  on patient and insurance preference. Use up to two times daily as directed. (FOR ICD-10 E10.9, E11.9).   Continuous Glucose Sensor (FREESTYLE LIBRE 2 SENSOR) MISC Change sensor every 14 days   donepezil (ARICEPT) 10 MG tablet Take 1 tablet (10 mg total) by mouth at bedtime.   dorzolamide-timolol (COSOPT) 2-0.5 % ophthalmic solution 1 drop 2 (two) times daily.   insulin degludec (TRESIBA FLEXTOUCH) 100 UNIT/ML FlexTouch Pen Inject 14 Units into the skin at bedtime.   Insulin Pen Needle (PEN NEEDLES) 31G X 6 MM MISC 1 each by Does not apply route at bedtime.   latanoprost (XALATAN) 0.005 % ophthalmic solution 1 drop at bedtime.   levETIRAcetam (KEPPRA) 500 MG tablet Take 1 tablet (500 mg total) by mouth 2 (two) times daily.   Vitamin D, Ergocalciferol, (DRISDOL) 1.25 MG (50000 UNIT) CAPS capsule Take 1 capsule (50,000 Units total) by mouth every 7 (seven) days.   [DISCONTINUED] sitaGLIPtin (JANUVIA) 100 MG tablet Take 1 tablet (100 mg total) by mouth daily.   sitaGLIPtin (JANUVIA) 100 MG tablet Take 1 tablet (100 mg total) by mouth daily.   No facility-administered encounter medications on file as of 10/31/2022.    ALLERGIES: Allergies  Allergen Reactions   Alpha-Gal      VACCINATION STATUS: Immunization History  Administered Date(s) Administered   Fluad Quad(high Dose 65+) 02/27/2019, 01/20/2021, 02/16/2022   Influenza,inj,Quad PF,6+ Mos 03/17/2013, 03/17/2014, 03/18/2015, 01/04/2016, 12/05/2016, 12/02/2019   Influenza-Unspecified 01/17/2017   Moderna Sars-Covid-2 Vaccination 06/23/2019, 07/21/2019, 02/12/2020   PPD Test  08/16/2010, 08/18/2010, 11/03/2013   Pneumococcal Conjugate-13 11/03/2013   Pneumococcal Polysaccharide-23 12/21/2008   Td 12/21/2008    Diabetes He presents for his follow-up diabetic visit. He has type 2 diabetes mellitus. Onset time: He was diagnosed at approximate age of 49 years. His disease course has been improving. There are no hypoglycemic associated symptoms. Pertinent negatives for hypoglycemia include no confusion, headaches, pallor or seizures. Associated symptoms include blurred vision and fatigue. Pertinent negatives for diabetes include no chest pain, no polydipsia, no polyphagia, no polyuria, no weakness and no weight loss. There are no hypoglycemic complications. Symptoms are stable. Diabetic complications include impotence, nephropathy, peripheral neuropathy and PVD. Risk factors for coronary artery disease include diabetes mellitus, dyslipidemia, hypertension, male sex and sedentary lifestyle. Current diabetic treatment includes oral agent (monotherapy) and insulin injections. He is compliant with treatment most of the time. His weight is fluctuating minimally. He is following a generally healthy diet. When asked about meal planning, he reported none. He has not had a previous visit with a dietitian. He never participates in exercise. His home blood glucose trend is decreasing steadily. His overall blood glucose range is >200 mg/dl. (He presents today, accompanied by his daughter, with his CGM showing slightly above target glycemic profile.  His POCT A1c today is 8.8%, improving from last visit of 11%.    His daughter is helping him manage his health.  Analysis of his CGM shows TIR 30%, TAR 70%, TBR 0% with a GMI of 8.4%.  Daughter does adjust insulin dosage based on night time glucose readings (to avoid hypoglycemia).) An ACE inhibitor/angiotensin II receptor blocker is being taken. He does not see a podiatrist.Eye exam is current.  Hyperlipidemia This is a chronic problem. The current  episode started more than 1 year ago. The problem is controlled. Recent lipid tests were reviewed and are normal. Exacerbating diseases include chronic renal disease and diabetes. There are no known factors aggravating his hyperlipidemia. Pertinent negatives include no chest  pain, myalgias or shortness of breath. Current antihyperlipidemic treatment includes statins. The current treatment provides moderate improvement of lipids. Compliance problems include adherence to diet.  Risk factors for coronary artery disease include dyslipidemia, diabetes mellitus, hypertension, male sex, a sedentary lifestyle and family history.  Hypertension This is a chronic problem. The current episode started more than 1 year ago. The problem has been resolved since onset. The problem is controlled. Associated symptoms include blurred vision. Pertinent negatives include no chest pain, headaches, neck pain, palpitations or shortness of breath. There are no associated agents to hypertension. Risk factors for coronary artery disease include diabetes mellitus, dyslipidemia, male gender and sedentary lifestyle. Past treatments include ACE inhibitors. The current treatment provides moderate improvement. There are no compliance problems.  Hypertensive end-organ damage includes kidney disease and PVD. Identifiable causes of hypertension include chronic renal disease.      Review of systems  Constitutional: + stable body weight,  current Body mass index is 18.04 kg/m. , no fatigue, no subjective hyperthermia, no subjective hypothermia, + decreased appetite Eyes: no blurry vision, no xerophthalmia ENT: no sore throat, no nodules palpated in throat, no dysphagia/odynophagia, no hoarseness Cardiovascular: no chest pain, no shortness of breath, no palpitations, no leg swelling Respiratory: no cough, no shortness of breath Gastrointestinal: no nausea/vomiting, + chronic diarrhea Musculoskeletal: no muscle/joint aches, reports multiple  falls Skin: no rashes, no hyperemia Neurological: no tremors, no numbness, no tingling, no dizziness Psychiatric: no depression, no anxiety    Objective:    BP 127/72 (BP Location: Left Arm, Patient Position: Sitting, Cuff Size: Normal)   Pulse 65   Ht 5\' 5"  (1.651 m)   Wt 108 lb 6.4 oz (49.2 kg)   BMI 18.04 kg/m   Wt Readings from Last 3 Encounters:  10/31/22 108 lb 6.4 oz (49.2 kg)  09/25/22 110 lb 9.6 oz (50.2 kg)  08/24/22 109 lb 0.6 oz (49.5 kg)    BP Readings from Last 3 Encounters:  10/31/22 127/72  09/25/22 127/63  08/24/22 116/65     Physical Exam- Limited  Constitutional:  Body mass index is 18.04 kg/m. , not in acute distress, normal state of mind Eyes:  EOMI, no exophthalmos Musculoskeletal: missing tips of some fingers, strength intact in all four extremities, no gross restriction of joint movements Skin:  no rashes, no hyperemia Neurological: no tremor with outstretched hands   Diabetic Foot Exam - Simple   No data filed     CMP ( most recent) CMP     Component Value Date/Time   NA 138 08/24/2022 0937   K 5.0 08/24/2022 0937   CL 101 08/24/2022 0937   CO2 21 08/24/2022 0937   GLUCOSE 142 (H) 08/24/2022 0937   GLUCOSE 125 (H) 03/21/2022 0243   BUN 13 08/24/2022 0937   CREATININE 1.34 (H) 08/24/2022 0937   CREATININE 1.36 (H) 09/30/2019 1008   CALCIUM 9.0 08/24/2022 0937   PROT 8.5 08/24/2022 0937   ALBUMIN 3.9 08/24/2022 0937   AST 36 08/24/2022 0937   ALT 10 08/24/2022 0937   ALKPHOS 82 08/24/2022 0937   BILITOT 0.3 08/24/2022 0937   GFRNONAA >60 03/21/2022 0243   GFRNONAA 49 (L) 09/30/2019 1008   GFRAA 59 (L) 03/31/2020 0848   GFRAA 57 (L) 09/30/2019 1008     Diabetic Labs (most recent): Lab Results  Component Value Date   HGBA1C 8.8 (A) 10/31/2022   HGBA1C 11.0 (H) 06/21/2022   HGBA1C 9.1 (A) 01/26/2022   MICROALBUR 150 07/14/2020  MICROALBUR 8.5 09/23/2018   MICROALBUR 127.6 (H) 06/20/2018     Lipid Panel ( most  recent) Lipid Panel     Component Value Date/Time   CHOL 70 (L) 08/24/2022 0937   TRIG 43 08/24/2022 0937   HDL 39 (L) 08/24/2022 0937   CHOLHDL 1.8 08/24/2022 0937   CHOLHDL 2.6 09/30/2019 1008   VLDL 7 11/27/2016 1000   LDLCALC 19 08/24/2022 0937   LDLCALC 58 09/30/2019 1008   LABVLDL 12 08/24/2022 0937      Lab Results  Component Value Date   TSH 2.350 08/24/2022   TSH 0.905 03/29/2021   TSH 1.300 03/31/2020   TSH 1.77 09/23/2018   TSH 0.61 07/19/2015   TSH 0.952 03/18/2015   TSH 0.921 12/11/2013   TSH 1.392 10/29/2012   TSH 0.615 09/26/2011   TSH 1.573 12/21/2008   FREET4 1.26 03/31/2020      Assessment & Plan:   1) Uncontrolled type 2 diabetes mellitus with hyperglycemia (HCC)  - Jacob Barnes has currently uncontrolled symptomatic type 2 DM since  82 years of age.  He presents today, accompanied by his daughter, with his CGM showing slightly above target glycemic profile.  His POCT A1c today is 8.8%, improving from last visit of 11%.    His daughter is helping him manage his health.  Analysis of his CGM shows TIR 30%, TAR 70%, TBR 0% with a GMI of 8.4%.  Daughter does adjust insulin dosage based on night time glucose readings (to avoid hypoglycemia).   Recent labs reviewed.  - I had a long discussion with him about the progressive nature of diabetes and the pathology behind its complications. -his diabetes is complicated by peripheral arterial disease and he remains at a high risk for more acute and chronic complications which include CAD, CVA, CKD, retinopathy, and neuropathy. These are all discussed in detail with him.  - Nutritional counseling repeated at each appointment due to patients tendency to fall back in to old habits.  - The patient admits there is a room for improvement in their diet and drink choices. -  Suggestion is made for the patient to avoid simple carbohydrates from their diet including Cakes, Sweet Desserts / Pastries, Ice Cream, Soda (diet  and regular), Sweet Tea, Candies, Chips, Cookies, Sweet Pastries, Store Bought Juices, Alcohol in Excess of 1-2 drinks a day, Artificial Sweeteners, Coffee Creamer, and "Sugar-free" Products. This will help patient to have stable blood glucose profile and potentially avoid unintended weight gain.   - I encouraged the patient to switch to unprocessed or minimally processed complex starch and increased protein intake (animal or plant source), fruits, and vegetables.   - Patient is advised to stick to a routine mealtimes to eat 3 meals a day and avoid unnecessary snacks (to snack only to correct hypoglycemia).  -I did give some suggestions for plant based protein shakes as the whey protein in the products he has tried is known to cause some GI distress if sensitive to lactose.  - I have approached him with the following individualized plan to manage  his diabetes and patient agrees:   -In light of his advanced age and light body weight, avoiding hypoglycemia should be the number 1 priority in his care.   -He is advised to continue his Tresiba 10 units SQ nightly (may cut dose in half if glucose is less than 150) and Januvia 100 mg po daily.    -He is encouraged to continue monitoring blood glucose at least  twice daily (using his CGM), before breakfast and before bed, and to call the clinic if he has readings less than 70 or above 300 for 3 tests in a row.    - he is not a candidate for Metformin due to concurrent renal insufficiency.  - he is not a candidate for incretin therapy due to his body habitus.   - Specific targets for  A1c;  LDL, HDL,  and Triglycerides were discussed with the patient.  2) Blood Pressure /Hypertension:   his blood pressure is controlled to target for his age.   he is advised to continue his current medications including Benazepril 10 mg p.o. daily with breakfast .  3) Lipids/Hyperlipidemia:    His most recent lipid panel from 08/24/22 shows controlled LDL at 19.   He  is advised to continue Crestor 5 mg po daily at bedtime.  Side effects and precautions discussed with him.  4)  Weight/Diet:  His Body mass index is 18.04 kg/m.   he is not a candidate for weight loss.   Exercise, and detailed carbohydrates information provided  -  detailed on discharge instructions.  5) Osteopenia: -He will not need bisphosphonate intervention at this time.  He is advised to continue on his Vitamin D 4000 units daily, as well as Calcium carbonate 600 mg p.o. daily.  His PCP started him on Ergocalciferol 50000 units weekly.  6) Chronic Care/Health Maintenance: -he is on ACEI/ARB and Statin medications and is encouraged to initiate and continue to follow up with Ophthalmology, Dentist, Podiatrist at least yearly or according to recommendations, and advised to stay away from smoking. I have recommended yearly flu vaccine and pneumonia vaccine at least every 5 years; moderate intensity exercise for up to 150 minutes weekly; and  sleep for at least 7 hours a day.  - he is advised to maintain close follow up with Kerri Perches, MD for primary care needs, as well as his other providers for optimal and coordinated care.      I spent  37  minutes in the care of the patient today including review of labs from CMP, Lipids, Thyroid Function, Hematology (current and previous including abstractions from other facilities); face-to-face time discussing  his blood glucose readings/logs, discussing hypoglycemia and hyperglycemia episodes and symptoms, medications doses, his options of short and long term treatment based on the latest standards of care / guidelines;  discussion about incorporating lifestyle medicine;  and documenting the encounter. Risk reduction counseling performed per USPSTF guidelines to reduce obesity and cardiovascular risk factors.     Please refer to Patient Instructions for Blood Glucose Monitoring and Insulin/Medications Dosing Guide"  in media tab for additional  information. Please  also refer to " Patient Self Inventory" in the Media  tab for reviewed elements of pertinent patient history.  Jacob Barnes participated in the discussions, expressed understanding, and voiced agreement with the above plans.  All questions were answered to his satisfaction. he is encouraged to contact clinic should he have any questions or concerns prior to his return visit.   Follow up plan: - Return in about 3 months (around 01/31/2023) for Diabetes F/U with A1c in office, No previsit labs, Bring meter and logs.  Ronny Bacon, [facility] [address] [place], [name] [address] Phone: [phone] Fax: [phone]  10/31/2022, 4:09 PM

## 2022-11-13 ENCOUNTER — Ambulatory Visit: Payer: Medicare HMO | Admitting: Podiatry

## 2022-11-13 ENCOUNTER — Encounter: Payer: Self-pay | Admitting: Podiatry

## 2022-11-13 DIAGNOSIS — M79675 Pain in left toe(s): Secondary | ICD-10-CM

## 2022-11-13 DIAGNOSIS — E1159 Type 2 diabetes mellitus with other circulatory complications: Secondary | ICD-10-CM

## 2022-11-13 DIAGNOSIS — M79674 Pain in right toe(s): Secondary | ICD-10-CM

## 2022-11-13 DIAGNOSIS — B351 Tinea unguium: Secondary | ICD-10-CM | POA: Diagnosis not present

## 2022-11-13 NOTE — Progress Notes (Signed)
This patient presents to the office with chief complaint of long thick nails and diabetic feet.  This patient  says there  is  no pain and discomfort in their feet.  This patient says there are long thick painful nails.  These nails are painful walking and wearing shoes.  Patient has no history of infection or drainage from both feet.  Patient is unable to  self treat his own nails .He presents to the office with his daughter.  He has not been seen in over 16 months. This patient presents  to the office today for treatment of the  long nails and a foot evaluation due to history of  diabetes.  General Appearance  Alert, conversant and in no acute stress.  Vascular  Dorsalis pedis and posterior tibial  pulses are weakly  palpable  bilaterally.  Capillary return is within normal limits  bilaterally. Temperature is within normal limits  bilaterally.  Neurologic  Senn-Weinstein monofilament wire test absent  bilaterally. Muscle power within normal limits bilaterally.  Nails Thick disfigured discolored nails with subungual debris  from hallux to fifth toes bilaterally. No evidence of bacterial infection or drainage bilaterally.  Orthopedic  No limitations of motion of motion feet .  No crepitus or effusions noted.  No bony pathology or digital deformities noted. HAV  B/L.  Skin  normotropic skin with no porokeratosis noted bilaterally.  No signs of infections or ulcers noted.     Onychomycosis  Diabetes with no foot complications  Debride nails x 10.  A diabetic foot exam was performed and there is no evidence of any vascular or neurologic pathology.  Debridement of nails with nail nipper followed by dremel tool.   RTC 3 months.   Helane Gunther DPM

## 2022-12-01 ENCOUNTER — Encounter: Payer: Medicare HMO | Admitting: Family Medicine

## 2022-12-06 ENCOUNTER — Other Ambulatory Visit: Payer: Self-pay | Admitting: Family Medicine

## 2022-12-14 ENCOUNTER — Ambulatory Visit: Payer: Medicare HMO | Admitting: Urology

## 2022-12-17 ENCOUNTER — Emergency Department (HOSPITAL_COMMUNITY)
Admission: EM | Admit: 2022-12-17 | Discharge: 2022-12-17 | Disposition: A | Discharge status: Home / Self Care | Payer: Medicare HMO | Attending: Student | Admitting: Student

## 2022-12-17 ENCOUNTER — Encounter (HOSPITAL_COMMUNITY): Payer: Self-pay

## 2022-12-17 ENCOUNTER — Emergency Department (HOSPITAL_COMMUNITY): Payer: Medicare HMO

## 2022-12-17 ENCOUNTER — Other Ambulatory Visit: Payer: Self-pay

## 2022-12-17 DIAGNOSIS — Z79899 Other long term (current) drug therapy: Secondary | ICD-10-CM | POA: Diagnosis not present

## 2022-12-17 DIAGNOSIS — I10 Essential (primary) hypertension: Secondary | ICD-10-CM | POA: Diagnosis not present

## 2022-12-17 DIAGNOSIS — N132 Hydronephrosis with renal and ureteral calculous obstruction: Secondary | ICD-10-CM | POA: Diagnosis not present

## 2022-12-17 DIAGNOSIS — R319 Hematuria, unspecified: Secondary | ICD-10-CM | POA: Diagnosis present

## 2022-12-17 DIAGNOSIS — J189 Pneumonia, unspecified organism: Secondary | ICD-10-CM

## 2022-12-17 DIAGNOSIS — J181 Lobar pneumonia, unspecified organism: Secondary | ICD-10-CM | POA: Diagnosis not present

## 2022-12-17 DIAGNOSIS — E119 Type 2 diabetes mellitus without complications: Secondary | ICD-10-CM | POA: Diagnosis not present

## 2022-12-17 DIAGNOSIS — N200 Calculus of kidney: Secondary | ICD-10-CM

## 2022-12-17 DIAGNOSIS — Z794 Long term (current) use of insulin: Secondary | ICD-10-CM | POA: Insufficient documentation

## 2022-12-17 LAB — URINALYSIS, W/ REFLEX TO CULTURE (INFECTION SUSPECTED)
Bacteria, UA: NONE SEEN
Bilirubin Urine: NEGATIVE
Glucose, UA: >=500 mg/dL — AB
Ketones, ur: NEGATIVE mg/dL
Leukocytes,Ua: NEGATIVE
Nitrite: NEGATIVE
Protein, ur: 100 mg/dL — AB
RBC / HPF: >50 RBC/hpf (ref 0–5)
Specific Gravity, Urine: 1.014 (ref 1.005–1.030)
pH: 6 (ref 5.0–8.0)

## 2022-12-17 LAB — BASIC METABOLIC PANEL
Anion gap: 8 (ref 5–15)
BUN: 18 mg/dL (ref 8–23)
CO2: 26 mmol/L (ref 22–32)
Calcium: 8.3 mg/dL — ABNORMAL LOW (ref 8.9–10.3)
Chloride: 100 mmol/L (ref 98–111)
Creatinine, Ser: 1.25 mg/dL — ABNORMAL HIGH (ref 0.61–1.24)
GFR, Estimated: 57 mL/min — ABNORMAL LOW (ref >60)
Glucose, Bld: 274 mg/dL — ABNORMAL HIGH (ref 70–99)
Potassium: 4.1 mmol/L (ref 3.5–5.1)
Sodium: 134 mmol/L — ABNORMAL LOW (ref 135–145)

## 2022-12-17 LAB — CBG MONITORING, ED: Glucose-Capillary: 253 mg/dL — ABNORMAL HIGH (ref 70–99)

## 2022-12-17 MED ORDER — LEVOFLOXACIN 750 MG PO TABS
750.0000 mg | ORAL_TABLET | Freq: Once | ORAL | Status: AC
  Administered 2022-12-17: 750 mg via ORAL
  Filled 2022-12-17: qty 1

## 2022-12-17 MED ORDER — LEVOFLOXACIN 750 MG PO TABS: 750.0000 mg | ORAL_TABLET | Freq: Every day | ORAL | 0 refills | Status: AC

## 2022-12-17 MED ORDER — IOHEXOL 300 MG/ML  SOLN
75.0000 mL | Freq: Once | INTRAMUSCULAR | Status: AC | PRN
  Administered 2022-12-17: 75 mL via INTRAVENOUS

## 2022-12-17 NOTE — ED Triage Notes (Signed)
Pt c/o hematuria x a few days.  Denies pain and other complaints.  Pt is not on blood thinners.

## 2022-12-17 NOTE — Discharge Instructions (Signed)
Is a pleasure taking care of you today you were seen for blood in your urine. Your urine showed blood with some white blood cells but no bacteria or other signs of infection.  Your urine was sent for culture.  The antibiotics you are started on will cover for a urinary tract infection. Your CT scan showed pneumonia in your left lower lobe, the antibiotics will also treat this infection.  It also showed a 1 cm stone in your left kidney.  This is probably the cause of the blood in your urine.  You need to follow-up with the urologist.  Come back to the ER if you have fever, vomiting, pain or any other new or worsening symptoms.

## 2022-12-17 NOTE — ED Provider Notes (Signed)
[place] EMERGENCY DEPARTMENT AT [facility] Provider Note   CSN: [id number] Arrival date & time: 12/17/22  1043     History  Chief Complaint  Patient presents with   Hematuria    Jacob Barnes is a 82 y.o. male.  The ER today for 1 to 2 days a history of blood in the urine, described as red-tinged.  No fevers or chills, no flank pain or abdominal pain, denies dysuria nausea vomiting or any other complaints.  This happened once before but only lasted for about a day and went away.  Has been drinking a lot of water without change.  The history is provided by the patient and a relative.  Hematuria       Home Medications Prior to Admission medications   Medication Sig Start Date End Date Taking? Authorizing Provider  levofloxacin (LEVAQUIN) 750 MG tablet Take 1 tablet (750 mg total) by mouth daily for 4 days. 12/18/22 12/22/22 Yes Amie Cowens A, PA-C  amLODipine (NORVASC) 5 MG tablet TAKE 1 TABLET (5 MG TOTAL) BY MOUTH DAILY. 12/06/22   Kerri Perches, MD  atorvastatin (LIPITOR) 10 MG tablet GIVE 1 TABLET BY MOUTH AT BEDTIME EVERY MONDAY/WEDNESDAY/FRIDAY FOR HIGH CHOLESTEROL 08/23/22   Kerri Perches, MD  blood glucose meter kit and supplies Dispense based on patient and insurance preference. Use up to two times daily as directed. (FOR ICD-10 E10.9, E11.9). 04/27/22   Dani Gobble, NP  Continuous Glucose Sensor (FREESTYLE LIBRE 2 SENSOR) MISC Change sensor every 14 days 09/12/22   Dani Gobble, NP  donepezil (ARICEPT) 10 MG tablet Take 1 tablet (10 mg total) by mouth at bedtime. 09/16/22   Kerri Perches, MD  dorzolamide-timolol (COSOPT) 2-0.5 % ophthalmic solution 1 drop 2 (two) times daily. 05/09/22   [provider]  insulin degludec (TRESIBA FLEXTOUCH) 100 UNIT/ML FlexTouch Pen Inject 14 Units into the skin at bedtime. 07/20/22   Dani Gobble, NP  Insulin Pen Needle (PEN NEEDLES) 31G X 6 MM MISC 1 each by Does not apply route at bedtime.  11/18/21   Dani Gobble, NP  latanoprost (XALATAN) 0.005 % ophthalmic solution 1 drop at bedtime. 05/31/22   [provider]  levETIRAcetam (KEPPRA) 500 MG tablet Take 1 tablet (500 mg total) by mouth 2 (two) times daily. 06/14/22   Kerri Perches, MD  sitaGLIPtin (JANUVIA) 100 MG tablet Take 1 tablet (100 mg total) by mouth daily. 10/31/22   Dani Gobble, NP  Vitamin D, Ergocalciferol, (DRISDOL) 1.25 MG (50000 UNIT) CAPS capsule Take 1 capsule (50,000 Units total) by mouth every 7 (seven) days. 08/28/22   Kerri Perches, MD      Allergies    Alpha-gal    Review of Systems   Review of Systems  Genitourinary:  Positive for hematuria.    Physical Exam Updated Vital Signs BP 119/71   Pulse 60   Temp 97.7 F (36.5 C) (Oral)   Resp 18   Ht 5\' 5"  (1.651 m)   Wt 49 kg   SpO2 99%   BMI 17.97 kg/m  Physical Exam Vitals and nursing note reviewed.  Constitutional:      General: He is not in acute distress.    Appearance: He is well-developed.  HENT:     Head: Normocephalic and atraumatic.  Eyes:     Conjunctiva/sclera: Conjunctivae normal.  Cardiovascular:     Rate and Rhythm: Normal rate and regular rhythm.  Heart sounds: No murmur heard. Pulmonary:     Effort: Pulmonary effort is normal. No respiratory distress.     Breath sounds: Normal breath sounds.  Abdominal:     Palpations: Abdomen is soft.     Tenderness: There is no abdominal tenderness. There is no right CVA tenderness, left CVA tenderness, guarding or rebound.  Musculoskeletal:        General: No swelling.     Cervical back: Neck supple.  Skin:    General: Skin is warm and dry.     Capillary Refill: Capillary refill takes less than 2 seconds.  Neurological:     General: No focal deficit present.     Mental Status: He is alert and oriented to person, place, and time.  Psychiatric:        Mood and Affect: Mood normal.     ED Results / Procedures / Treatments   Labs (all labs  ordered are listed, but only abnormal results are displayed) Labs Reviewed  URINALYSIS, W/ REFLEX TO CULTURE (INFECTION SUSPECTED) - Abnormal; Notable for the following components:      Result Value   Color, Urine AMBER (*)    APPearance HAZY (*)    Glucose, UA >=500 (*)    Hgb urine dipstick LARGE (*)    Protein, ur 100 (*)    All other components within normal limits  BASIC METABOLIC PANEL - Abnormal; Notable for the following components:   Sodium 134 (*)    Glucose, Bld 274 (*)    Creatinine, Ser 1.25 (*)    Calcium 8.3 (*)    GFR, Estimated 57 (*)    All other components within normal limits  CBG MONITORING, ED - Abnormal; Notable for the following components:   Glucose-Capillary 253 (*)    All other components within normal limits  URINE CULTURE    EKG None  Radiology CT ABDOMEN PELVIS W CONTRAST  Result Date: 12/17/2022 CLINICAL DATA:  Hematuria and abdominal pain. EXAM: CT ABDOMEN AND PELVIS WITH CONTRAST TECHNIQUE: Multidetector CT imaging of the abdomen and pelvis was performed using the standard protocol following bolus administration of intravenous contrast. RADIATION DOSE REDUCTION: This exam was performed according to the departmental dose-optimization program which includes automated exposure control, adjustment of the mA and/or kV according to patient size and/or use of iterative reconstruction technique. CONTRAST:  75mL OMNIPAQUE IOHEXOL 300 MG/ML  SOLN COMPARISON:  May 03, 2021 FINDINGS: Lower chest: Left lower lobe patchy airspace consolidation. Hepatobiliary: No focal liver abnormality is seen. No gallstones, gallbladder wall thickening, or biliary dilatation. Pancreas: Unremarkable. No pancreatic ductal dilatation or surrounding inflammatory changes. Spleen: Normal in size without focal abnormality. Adrenals/Urinary Tract: Normal adrenal glands. Normal right kidney. 1 cm partially obstructive stone in the pelvis of the left kidney. Minimal left hydronephrosis.  Diffuse circumferential mucosal thickening of the urinary bladder. Stomach/Bowel: Stomach is within normal limits. No evidence of bowel wall thickening, distention, or inflammatory changes. Vascular/Lymphatic: Aortic atherosclerosis. No enlarged abdominal or pelvic lymph nodes. Reproductive: Minimal enlargement of the prostate gland. Other: No abdominal wall hernia or abnormality. No abdominopelvic ascites. Musculoskeletal: Severe compression deformity of L1 vertebral body with approximately 80-90% height loss. No evidence of retropulsion. This finding is stable from January 2023. Healed complex fracture of the right iliac bone. IMPRESSION: 1. 1 cm partially obstructive stone in the pelvis of the left kidney. Minimal left hydronephrosis. 2. Diffuse circumferential mucosal thickening of the urinary bladder. This may be seen with cystitis. 3. Left lower lobe patchy  airspace consolidation, concerning for pneumonia. 4. Severe compression deformity of L1 vertebral body with approximately 80-90% height loss. No evidence of retropulsion. This finding is stable from January 2023. 5. Healed complex fracture of the right iliac bone. 6. Aortic atherosclerosis. Aortic Atherosclerosis (ICD10-I70.0). Electronically Signed   By: Ted Mcalpine M.D.   On: 12/17/2022 16:44    Procedures Procedures    Medications Ordered in ED Medications  iohexol (OMNIPAQUE) 300 MG/ML solution 75 mL (75 mLs Intravenous Contrast Given 12/17/22 1600)  levofloxacin (LEVAQUIN) tablet 750 mg (750 mg Oral Given 12/17/22 1728)    ED Course/ Medical Decision Making/ A&P                                 Medical Decision Making This patient presents to the ED for concern of illness hematuria x 1 to 2 days, this involves an extensive number of treatment options, and is a complaint that carries with it a high risk of complications and morbidity.  The differential diagnosis includes UTI, kidney stone, bladder carcinoma, residual cystitis,  other   Co morbidities that complicate the patient evaluation :   EMEA, hypertension, diabetes   Additional history obtained:  Additional history obtained from EMR External records from outside source obtained and reviewed including notes, prior radiology studies, prior labs   Lab Tests:  I Ordered, and personally interpreted labs.  The pertinent results include: Baseline renal function, urinalysis shows large glucose in the urine, greater than 50 red blood cells, 20-50 white blood cells with no bacteria and negative leukocytes and nitrite.   Imaging Studies ordered:  I ordered imaging studies including CT abdomen pelvis which shows left lower lobe pneumonia and the left renal pelvis stone 1 cm I independently visualized and interpreted imaging within scope of identifying emergent findings  I agree with the radiologist interpretation     Problem List / ED Course / Critical interventions / Medication management  .-Patient having no symptoms otherwise no flank pain, no chest pain, shortness of breath, no nausea or vomiting.  Urinalysis had blood and some white blood cells but no other signs of infection.  It was sent for culture.  CT ordered to rule out malignancy, stone or other process causing his hematuria.  He is left renal pelvis stone is 1 cm causing mild obstruction.  There is a likely cause of his hematuria.  He has some bladder wall thickening.  He is being started on Levaquin that would cover the pneumonia seen on his CT as well as possible UTI and referred to urology.  Given strict follow-up and return precautions.  He is very comfortable, well-appearing well-hydrated.  Also staffed with Dr. Hyacinth Meeker. Patient verbalized understanding of instructions.  He is going to follow-up with urology. I have reviewed the patients home medicines and have made adjustments as needed     Amount and/or Complexity of Data Reviewed Labs: ordered. Radiology: ordered.  Risk Prescription drug  management.           Final Clinical Impression(s) / ED Diagnoses Final diagnoses:  Calculus of left kidney  Pneumonia of left lower lobe due to infectious organism    Rx / DC Orders ED Discharge Orders          Ordered    levofloxacin (LEVAQUIN) 750 MG tablet  Daily        12/17/22 1656  Josem Kaufmann 12/17/22 1840    Eber Hong, MD 12/17/22 2259

## 2022-12-19 LAB — URINE CULTURE

## 2022-12-21 ENCOUNTER — Ambulatory Visit: Payer: Medicare HMO | Admitting: Urology

## 2022-12-21 ENCOUNTER — Other Ambulatory Visit: Payer: Self-pay

## 2022-12-21 ENCOUNTER — Encounter: Payer: Self-pay | Admitting: Urology

## 2022-12-21 ENCOUNTER — Ambulatory Visit (HOSPITAL_COMMUNITY)
Admission: RE | Admit: 2022-12-21 | Discharge: 2022-12-21 | Disposition: A | Discharge status: Home / Self Care | Payer: Medicare HMO | Source: Ambulatory Visit | Attending: Urology | Admitting: Urology

## 2022-12-21 ENCOUNTER — Telehealth: Payer: Self-pay

## 2022-12-21 VITALS — BP 115/69 | HR 81 | Ht 65.0 in | Wt 108.0 lb

## 2022-12-21 DIAGNOSIS — N200 Calculus of kidney: Secondary | ICD-10-CM

## 2022-12-21 NOTE — Progress Notes (Signed)
Subjective: 1. Renal stones      Consult requested by [facility]  Jacob Barnes is an 82 yo male who is sent from the ER for a 1cm stone in the left renal pelvis without obstruction.  He had hematuria but no pain.   He was also found to have a LLL pneumonia and his UA had blood and pus but the culture was negative.  He  may have had a stone in the past.  He hasn't had prior UTI's.  He is feeling well today.  He is voiding well with an IPSS of 3 and nocturia x 3. I got a KUB prior to this visit and there is a faint 9mm round opacity at the L3 transverse process on the left.   He has no fever or SOB.  He has had a prior cystoscopy with ureteroscopy. ROS:  Review of Systems  Constitutional:  Negative for chills and fever.  Respiratory:  Negative for cough and shortness of breath.   Cardiovascular:  Negative for chest pain.  Gastrointestinal:  Positive for diarrhea.  Genitourinary:  Positive for hematuria. Negative for flank pain.    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   Qualifier: Diagnosis of  By: Lodema Hong MD, Margaret     GERD (gastroesophageal reflux disease)    Glaucoma    both eyes   History of kidney stones    Hyperlipemia    Hypertension    Iron deficiency anemia 01/10/2014   Other pancytopenia (HCC) 01/10/2014   New in 01/2014, undergoing pathology review    Rash    front abdomen no drainage    Past Surgical History:  Procedure Laterality Date   AMPUTATION Right 09/01/2016   Procedure: AMPUTATION RIGHT MIDDLE FINGER  TIP;  Surgeon: Kathryne Hitch, MD;  Location: [facility];  Service: Orthopedics;  Laterality: Right;   AMPUTATION Right 10/10/2016   Procedure: REPEAT IRRIGATION AND DEBRIDEMENT RIGHT MIDDLE FINGER WITH AMPUTATION THROUGH PROXIMAL PHALANX;  Surgeon: Kathryne Hitch, MD;  Location: [facility];  Service: Orthopedics;  Laterality: Right;   APPENDECTOMY     BIOPSY  01/13/2022   Procedure: BIOPSY;  Surgeon: Dolores Frame, MD;  Location: [facility];  Service: Gastroenterology;;   COLONOSCOPY  09/06/2011   Procedure: COLONOSCOPY;  Surgeon: Malissa Hippo, MD;  Location: [facility];  Service: Endoscopy;  Laterality: N/A;  830   COLONOSCOPY WITH PROPOFOL N/A 01/13/2022   Procedure: COLONOSCOPY WITH PROPOFOL;  Surgeon: Dolores Frame, MD;  Location: [facility];  Service: Gastroenterology;  Laterality: N/A;  200 ASA 3   CYSTOSCOPY/RETROGRADE/URETEROSCOPY/STONE EXTRACTION WITH BASKET     ESOPHAGOGASTRODUODENOSCOPY N/A 01/15/2014   Procedure: ESOPHAGOGASTRODUODENOSCOPY (EGD);  Surgeon: Malissa Hippo, MD;  Location: [facility];  Service: Endoscopy;  Laterality: N/A;  200   ESOPHAGOGASTRODUODENOSCOPY N/A 04/22/2014   Procedure: ESOPHAGOGASTRODUODENOSCOPY (EGD);  Surgeon: Malissa Hippo, MD;  Location: [facility];  Service: Endoscopy;  Laterality: N/A;  240   EYE SURGERY Bilateral yrs ago   ioc for cataract   IR ANGIO EXTERNAL CAROTID SEL EXT CAROTID UNI L MOD SED  03/22/2022   IR ANGIO INTRA EXTRACRAN SEL INTERNAL CAROTID UNI L MOD SED  03/26/2022   IR ANGIOGRAM FOLLOW UP STUDY  03/26/2022   IR NEURO EACH ADD'L AFTER BASIC UNI LEFT (MS)  03/26/2022   IR TRANSCATH/EMBOLIZ  03/26/2022   MALONEY DILATION N/A 01/15/2014   Procedure: Elease Hashimoto DILATION;  Surgeon: Malissa Hippo, MD;  Location: [facility];  Service: Endoscopy;  Laterality: N/A;   MALONEY DILATION  04/22/2014   Procedure: Elease Hashimoto DILATION;  Surgeon: Malissa Hippo, MD;  Location: [facility];   Service: Endoscopy;;   POLYPECTOMY  01/13/2022   Procedure: POLYPECTOMY;  Surgeon: Marguerita Merles, Reuel Boom, MD;  Location: [facility];  Service: Gastroenterology;;   RADIOLOGY WITH ANESTHESIA Left 03/22/2022   Procedure: LEFT MMA EMBOLIZATION;  Surgeon: Lisbeth Renshaw, MD;  Location: [facility];  Service: Radiology;  Laterality: Left;   surgery for left eye bleedf  2017    Social History   Socioeconomic History   Marital status: Married    Spouse name: Not on file   Number of children: Not on file   Years of education: Not on file   Highest education level: Not on file  Occupational History   Not on file  Tobacco Use   Smoking status: Never    Passive exposure: Never   Smokeless tobacco: Never  Vaping Use   Vaping status: Never Used  Substance and Sexual Activity   Alcohol use: No   Drug use: No   Sexual activity: Yes  Other Topics Concern   Not on file  Social History Narrative   Not on file   Social Determinants of Health   Financial Resource Strain: Low Risk  (07/13/2022)   Overall Financial Resource Strain (CARDIA)    Difficulty of Paying Living Expenses: Not hard at all  Food Insecurity: No Food Insecurity (07/13/2022)   Hunger Vital Sign    Worried About Running Out of Food in the Last Year: Never true    Ran Out of Food in the Last Year: Never true  Transportation Needs: No Transportation Needs (07/13/2022)   PRAPARE - Administrator, Civil Service (Medical): No    Lack of Transportation (Non-Medical): No  Physical Activity: Sufficiently Active (07/13/2022)   Exercise Vital Sign    Days of Exercise per Week: 4 days    Minutes of Exercise per Session: 60 min  Stress: No Stress Concern Present (07/13/2022)   [employer] of Occupational Health - Occupational Stress Questionnaire    Feeling of Stress : Not at all  Social Connections: Socially Integrated (07/13/2022)   Social Connection and Isolation Panel [NHANES]    Frequency of Communication with  Friends and Family: More than three times a week    Frequency of Social Gatherings with Friends and Family: Once a week    Attends Religious Services: More than 4 times per year    Active Member of [employer] or Organizations: No    Attends Engineer, structural: 1 to 4 times per year    Marital Status: Married  Catering manager Violence: Not At Risk (07/13/2022)   Humiliation, Afraid, Rape, and Kick questionnaire    Fear of Current or Ex-Partner: No    Emotionally Abused: No    Physically Abused: No    Sexually  Abused: No    Family History  Problem Relation Age of Onset   Diabetes Mother    Hypertension Mother    Hyperlipidemia Mother    Stroke Mother    Heart failure Mother    Diabetes Father    Heart disease Father    Hypertension Father    Hyperlipidemia Father    Heart attack Father    Diabetes Brother    Diabetes Brother    Diabetes Son     Anti-infectives: Anti-infectives (From admission, onward)    None       Current Outpatient Medications  Medication Sig Dispense Refill   amLODipine (NORVASC) 5 MG tablet TAKE 1 TABLET (5 MG TOTAL) BY MOUTH DAILY. 90 tablet 1   atorvastatin (LIPITOR) 10 MG tablet GIVE 1 TABLET BY MOUTH AT BEDTIME EVERY MONDAY/WEDNESDAY/FRIDAY FOR HIGH CHOLESTEROL 45 tablet 1   blood glucose meter kit and supplies Dispense based on patient and insurance preference. Use up to two times daily as directed. (FOR ICD-10 E10.9, E11.9). 1 each 0   Continuous Glucose Sensor (FREESTYLE LIBRE 2 SENSOR) MISC Change sensor every 14 days 6 each 3   donepezil (ARICEPT) 10 MG tablet Take 1 tablet (10 mg total) by mouth at bedtime. 90 tablet 3   dorzolamide-timolol (COSOPT) 2-0.5 % ophthalmic solution 1 drop 2 (two) times daily.     insulin degludec (TRESIBA FLEXTOUCH) 100 UNIT/ML FlexTouch Pen Inject 14 Units into the skin at bedtime. 12 mL 3   Insulin Pen Needle (PEN NEEDLES) 31G X 6 MM MISC 1 each by Does not apply route at bedtime. 100 each 2    latanoprost (XALATAN) 0.005 % ophthalmic solution 1 drop at bedtime.     levETIRAcetam (KEPPRA) 500 MG tablet Take 1 tablet (500 mg total) by mouth 2 (two) times daily. 60 tablet 5   levofloxacin (LEVAQUIN) 750 MG tablet Take 1 tablet (750 mg total) by mouth daily for 4 days. 4 tablet 0   sitaGLIPtin (JANUVIA) 100 MG tablet Take 1 tablet (100 mg total) by mouth daily. 90 tablet 1   Vitamin D, Ergocalciferol, (DRISDOL) 1.25 MG (50000 UNIT) CAPS capsule Take 1 capsule (50,000 Units total) by mouth every 7 (seven) days. 5 capsule 8   No current facility-administered medications for this visit.     Objective: Vital signs in last 24 hours: BP 115/69   Pulse 81   Ht 5\' 5"  (1.651 m)   Wt 108 lb (49 kg)   BMI 17.97 kg/m   Intake/Output from previous day: No intake/output data recorded. Intake/Output this shift: @IOTHISSHIFT @   Physical Exam Vitals reviewed.  Constitutional:      Appearance: Normal appearance.  Cardiovascular:     Rate and Rhythm: Normal rate and regular rhythm.     Heart sounds: Normal heart sounds.  Pulmonary:     Effort: Pulmonary effort is normal.     Breath sounds: Normal breath sounds.  Abdominal:     General: Abdomen is flat.     Palpations: Abdomen is soft.     Tenderness: There is no abdominal tenderness.  Musculoskeletal:        General: No swelling or tenderness. Normal range of motion.  Skin:    General: Skin is warm and dry.  Neurological:     General: No focal deficit present.     Mental Status: He is alert and oriented to person, place, and time.     Lab Results:  No results found for this or any previous visit (  from the past 24 hour(s)).  BMET No results for input(s): "NA", "K", "CL", "CO2", "GLUCOSE", "BUN", "CREATININE", "CALCIUM" in the last 72 hours. PT/INR No results for input(s): "LABPROT", "INR" in the last 72 hours. ABG No results for input(s): "PHART", "HCO3" in the last 72 hours.  Invalid input(s): "PCO2", "PO2" Recent  Results (from the past 2160 hour(s))  HgB A1c     Status: Abnormal   Collection Time: 10/31/22  3:50 PM  Result Value Ref Range   Hemoglobin A1C 8.8 (A) 4.0 - 5.6 %   HbA1c POC (<> result, manual entry)     HbA1c, POC (prediabetic range)     HbA1c, POC (controlled diabetic range)    Urinalysis, w/ Reflex to Culture (Infection Suspected) -Urine, Clean Catch     Status: Abnormal   Collection Time: 12/17/22 12:08 PM  Result Value Ref Range   Specimen Source URINE, CLEAN CATCH    Color, Urine AMBER (A) YELLOW    Comment: BIOCHEMICALS MAY BE AFFECTED BY COLOR   APPearance HAZY (A) CLEAR   Specific Gravity, Urine 1.014 1.005 - 1.030   pH 6.0 5.0 - 8.0   Glucose, UA >=500 (A) NEGATIVE mg/dL   Hgb urine dipstick LARGE (A) NEGATIVE   Bilirubin Urine NEGATIVE NEGATIVE   Ketones, ur NEGATIVE NEGATIVE mg/dL   Protein, ur 536 (A) NEGATIVE mg/dL   Nitrite NEGATIVE NEGATIVE   Leukocytes,Ua NEGATIVE NEGATIVE   RBC / HPF >50 0 - 5 RBC/hpf   WBC, UA 21-50 0 - 5 WBC/hpf    Comment:        Reflex urine culture not performed if WBC <=10, OR if Squamous epithelial cells >5. If Squamous epithelial cells >5 suggest recollection.    Bacteria, UA NONE SEEN NONE SEEN   Squamous Epithelial / HPF 0-5 0 - 5 /HPF    Comment: Performed at [facility], [address], [place], [name] [address]  Urine Culture     Status: Abnormal   Collection Time: 12/17/22 12:08 PM   Specimen: Urine, Random  Result Value Ref Range   Specimen Description      URINE, RANDOM Performed at [facility], [address], [place], [name] [address]    Special Requests      NONE Reflexed from [id number] Performed at [facility], [address], [place], [name] [address]    Culture MULTIPLE SPECIES PRESENT, SUGGEST RECOLLECTION (A)    Report Status 12/19/2022 FINAL   Basic metabolic panel     Status: Abnormal   Collection Time: 12/17/22  3:23 PM  Result Value Ref Range   Sodium 134 (L) 135 - 145 mmol/L   Potassium 4.1 3.5 -  5.1 mmol/L   Chloride 100 98 - 111 mmol/L   CO2 26 22 - 32 mmol/L   Glucose, Bld 274 (H) 70 - 99 mg/dL    Comment: Glucose reference range applies only to samples taken after fasting for at least 8 hours.   BUN 18 8 - 23 mg/dL   Creatinine, Ser 2.95 (H) 0.61 - 1.24 mg/dL   Calcium 8.3 (L) 8.9 - 10.3 mg/dL   GFR, Estimated 57 (L) >60 mL/min    Comment: (NOTE) Calculated using the CKD-EPI Creatinine Equation (2021)    Anion gap 8 5 - 15    Comment: Performed at [facility], [address] [place], [name] [address]  CBG monitoring, ED     Status: Abnormal   Collection Time: 12/17/22  3:32 PM  Result Value Ref Range  Glucose-Capillary 253 (H) 70 - 99 mg/dL    Comment: Glucose reference range applies only to samples taken after fasting for at least 8 hours.    Studies/Results: No results found. CT ABDOMEN PELVIS W CONTRAST  Result Date: 12/17/2022 CLINICAL DATA:  Hematuria and abdominal pain. EXAM: CT ABDOMEN AND PELVIS WITH CONTRAST TECHNIQUE: Multidetector CT imaging of the abdomen and pelvis was performed using the standard protocol following bolus administration of intravenous contrast. RADIATION DOSE REDUCTION: This exam was performed according to the departmental dose-optimization program which includes automated exposure control, adjustment of the mA and/or kV according to patient size and/or use of iterative reconstruction technique. CONTRAST:  75mL OMNIPAQUE IOHEXOL 300 MG/ML  SOLN COMPARISON:  May 03, 2021 FINDINGS: Lower chest: Left lower lobe patchy airspace consolidation. Hepatobiliary: No focal liver abnormality is seen. No gallstones, gallbladder wall thickening, or biliary dilatation. Pancreas: Unremarkable. No pancreatic ductal dilatation or surrounding inflammatory changes. Spleen: Normal in size without focal abnormality. Adrenals/Urinary Tract: Normal adrenal glands. Normal right kidney. 1 cm partially obstructive stone in the pelvis of the left kidney. Minimal left  hydronephrosis. Diffuse circumferential mucosal thickening of the urinary bladder. Stomach/Bowel: Stomach is within normal limits. No evidence of bowel wall thickening, distention, or inflammatory changes. Vascular/Lymphatic: Aortic atherosclerosis. No enlarged abdominal or pelvic lymph nodes. Reproductive: Minimal enlargement of the prostate gland. Other: No abdominal wall hernia or abnormality. No abdominopelvic ascites. Musculoskeletal: Severe compression deformity of L1 vertebral body with approximately 80-90% height loss. No evidence of retropulsion. This finding is stable from January 2023. Healed complex fracture of the right iliac bone. IMPRESSION: 1. 1 cm partially obstructive stone in the pelvis of the left kidney. Minimal left hydronephrosis. 2. Diffuse circumferential mucosal thickening of the urinary bladder. This may be seen with cystitis. 3. Left lower lobe patchy airspace consolidation, concerning for pneumonia. 4. Severe compression deformity of L1 vertebral body with approximately 80-90% height loss. No evidence of retropulsion. This finding is stable from January 2023. 5. Healed complex fracture of the right iliac bone. 6. Aortic atherosclerosis. Aortic Atherosclerosis (ICD10-I70.0). Electronically Signed   By: Ted Mcalpine M.D.   On: 12/17/2022 16:44   CT HEAD WO CONTRAST ( )  Result Date: 10/13/2022 CLINICAL DATA:  Follow-up subdural hematoma. EXAM: CT HEAD WITHOUT CONTRAST TECHNIQUE: Contiguous axial images were obtained from the base of the skull through the vertex without intravenous contrast. RADIATION DOSE REDUCTION: This exam was performed according to the departmental dose-optimization program which includes automated exposure control, adjustment of the mA and/or kV according to patient size and/or use of iterative reconstruction technique. COMPARISON:  Head CT 07/13/2022 FINDINGS: Brain: The intermediate density subdural hematoma over the left cerebral convexity has slightly  further decreased in size, now measuring up to 4 mm in thickness. There is no midline shift or other significant mass effect. No acute infarct is evident. Mild cerebral atrophy is within normal limits for age. Patchy hypodensities in the cerebral white matter bilaterally are unchanged and nonspecific but compatible with moderate chronic small vessel ischemic disease. Vascular: Calcified atherosclerosis at the skull base. Prior left middle meningeal artery embolization. Skull: No fracture or suspicious osseous lesion. Sinuses/Orbits: Visualized paranasal sinuses and mastoid air cells are clear. Bilateral cataract extraction. Other: None. IMPRESSION: Further slightly decreased size of the left-sided subdural hematoma. No midline shift. Electronically Signed   By: Sebastian Ache M.D.   On: 10/13/2022 16:09     Assessment/Plan: 1cm left renal stone without obstruction.  The stone is about 300HU on CT  but  visible on KUB.  I discussed both ESWL and Ureteroscopy as options and reviewed the risks and benefits of both in detail.  I will get him scheduled for ESWL but will make sure Dr. Ronne Binning has an opportunity to look at the films and pivot to ureteroscopy if need be.    No orders of the defined types were placed in this encounter.    No orders of the defined types were placed in this encounter.    Return for Next available schedule lithotripsy with routine f/u.Marland Kitchen    CC: Dr. Syliva Overman.      Jacob Barnes 12/22/2022

## 2022-12-21 NOTE — Telephone Encounter (Signed)
Demographics and insurance efaxed to [employer] Release: [id number]

## 2022-12-21 NOTE — H&P (View-Only) (Signed)
Subjective: 1. Renal stones      Consult requested by [facility]  Jacob Barnes is an 82 yo male who is sent from the ER for a 1cm stone in the left renal pelvis without obstruction.  He had hematuria but no pain.   He was also found to have a LLL pneumonia and his UA had blood and pus but the culture was negative.  He  may have had a stone in the past.  He hasn't had prior UTI's.  He is feeling well today.  He is voiding well with an IPSS of 3 and nocturia x 3. I got a KUB prior to this visit and there is a faint 9mm round opacity at the L3 transverse process on the left.   He has no fever or SOB.  He has had a prior cystoscopy with ureteroscopy. ROS:  Review of Systems  Constitutional:  Negative for chills and fever.  Respiratory:  Negative for cough and shortness of breath.   Cardiovascular:  Negative for chest pain.  Gastrointestinal:  Positive for diarrhea.  Genitourinary:  Positive for hematuria. Negative for flank pain.    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   Qualifier: Diagnosis of  By: Lodema Hong MD, Margaret     GERD (gastroesophageal reflux disease)    Glaucoma    both eyes   History of kidney stones    Hyperlipemia    Hypertension    Iron deficiency anemia 01/10/2014   Other pancytopenia (HCC) 01/10/2014   New in 01/2014, undergoing pathology review    Rash    front abdomen no drainage    Past Surgical History:  Procedure Laterality Date   AMPUTATION Right 09/01/2016   Procedure: AMPUTATION RIGHT MIDDLE FINGER  TIP;  Surgeon: Kathryne Hitch, MD;  Location: [facility];  Service: Orthopedics;  Laterality: Right;   AMPUTATION Right 10/10/2016   Procedure: REPEAT IRRIGATION AND DEBRIDEMENT RIGHT MIDDLE FINGER WITH AMPUTATION THROUGH PROXIMAL PHALANX;  Surgeon: Kathryne Hitch, MD;  Location: [facility];  Service: Orthopedics;  Laterality: Right;   APPENDECTOMY     BIOPSY  01/13/2022   Procedure: BIOPSY;  Surgeon: Dolores Frame, MD;  Location: [facility];  Service: Gastroenterology;;   COLONOSCOPY  09/06/2011   Procedure: COLONOSCOPY;  Surgeon: Malissa Hippo, MD;  Location: [facility];  Service: Endoscopy;  Laterality: N/A;  830   COLONOSCOPY WITH PROPOFOL N/A 01/13/2022   Procedure: COLONOSCOPY WITH PROPOFOL;  Surgeon: Dolores Frame, MD;  Location: [facility];  Service: Gastroenterology;  Laterality: N/A;  200 ASA 3   CYSTOSCOPY/RETROGRADE/URETEROSCOPY/STONE EXTRACTION WITH BASKET     ESOPHAGOGASTRODUODENOSCOPY N/A 01/15/2014   Procedure: ESOPHAGOGASTRODUODENOSCOPY (EGD);  Surgeon: Malissa Hippo, MD;  Location: [facility];  Service: Endoscopy;  Laterality: N/A;  200   ESOPHAGOGASTRODUODENOSCOPY N/A 04/22/2014   Procedure: ESOPHAGOGASTRODUODENOSCOPY (EGD);  Surgeon: Malissa Hippo, MD;  Location: [facility];  Service: Endoscopy;  Laterality: N/A;  240   EYE SURGERY Bilateral yrs ago   ioc for cataract   IR ANGIO EXTERNAL CAROTID SEL EXT CAROTID UNI L MOD SED  03/22/2022   IR ANGIO INTRA EXTRACRAN SEL INTERNAL CAROTID UNI L MOD SED  03/26/2022   IR ANGIOGRAM FOLLOW UP STUDY  03/26/2022   IR NEURO EACH ADD'L AFTER BASIC UNI LEFT (MS)  03/26/2022   IR TRANSCATH/EMBOLIZ  03/26/2022   MALONEY DILATION N/A 01/15/2014   Procedure: Elease Hashimoto DILATION;  Surgeon: Malissa Hippo, MD;  [facility]: [facility];  Service: Endoscopy;  Laterality: N/A;   MALONEY DILATION  04/22/2014   Procedure: Elease Hashimoto DILATION;  Surgeon: Malissa Hippo, MD;  Location: [facility];   Service: Endoscopy;;   POLYPECTOMY  01/13/2022   Procedure: POLYPECTOMY;  Surgeon: Marguerita Merles, Reuel Boom, MD;  Location: [facility];  Service: Gastroenterology;;   RADIOLOGY WITH ANESTHESIA Left 03/22/2022   Procedure: LEFT MMA EMBOLIZATION;  Surgeon: Lisbeth Renshaw, MD;  Location: [facility] OR;  Service: Radiology;  Laterality: Left;   surgery for left eye bleedf  2017    Social History   Socioeconomic History   Marital status: Married    Spouse name: Not on file   Number of children: Not on file   Years of education: Not on file   Highest education level: Not on file  Occupational History   Not on file  Tobacco Use   Smoking status: Never    Passive exposure: Never   Smokeless tobacco: Never  Vaping Use   Vaping status: Never Used  Substance and Sexual Activity   Alcohol use: No   Drug use: No   Sexual activity: Yes  Other Topics Concern   Not on file  Social History Narrative   Not on file   Social Determinants of Health   Financial Resource Strain: Low Risk  (07/13/2022)   Overall Financial Resource Strain (CARDIA)    Difficulty of Paying Living Expenses: Not hard at all  Food Insecurity: No Food Insecurity (07/13/2022)   Hunger Vital Sign    Worried About Running Out of Food in the Last Year: Never true    Ran Out of Food in the Last Year: Never true  Transportation Needs: No Transportation Needs (07/13/2022)   PRAPARE - Administrator, Civil Service (Medical): No    Lack of Transportation (Non-Medical): No  Physical Activity: Sufficiently Active (07/13/2022)   Exercise Vital Sign    Days of Exercise per Week: 4 days    Minutes of Exercise per Session: 60 min  Stress: No Stress Concern Present (07/13/2022)   [employer] of Occupational Health - Occupational Stress Questionnaire    Feeling of Stress : Not at all  Social Connections: Socially Integrated (07/13/2022)   Social Connection and Isolation Panel [NHANES]    Frequency of Communication with  Friends and Family: More than three times a week    Frequency of Social Gatherings with Friends and Family: Once a week    Attends Religious Services: More than 4 times per year    Active Member of [employer] or Organizations: No    Attends Engineer, structural: 1 to 4 times per year    Marital Status: Married  Catering manager Violence: Not At Risk (07/13/2022)   Humiliation, Afraid, Rape, and Kick questionnaire    Fear of Current or Ex-Partner: No    Emotionally Abused: No    Physically Abused: No    Sexually  Abused: No    Family History  Problem Relation Age of Onset   Diabetes Mother    Hypertension Mother    Hyperlipidemia Mother    Stroke Mother    Heart failure Mother    Diabetes Father    Heart disease Father    Hypertension Father    Hyperlipidemia Father    Heart attack Father    Diabetes Brother    Diabetes Brother    Diabetes Son     Anti-infectives: Anti-infectives (From admission, onward)    None       Current Outpatient Medications  Medication Sig Dispense Refill   amLODipine (NORVASC) 5 MG tablet TAKE 1 TABLET (5 MG TOTAL) BY MOUTH DAILY. 90 tablet 1   atorvastatin (LIPITOR) 10 MG tablet GIVE 1 TABLET BY MOUTH AT BEDTIME EVERY MONDAY/WEDNESDAY/FRIDAY FOR HIGH CHOLESTEROL 45 tablet 1   blood glucose meter kit and supplies Dispense based on patient and insurance preference. Use up to two times daily as directed. (FOR ICD-10 E10.9, E11.9). 1 each 0   Continuous Glucose Sensor (FREESTYLE LIBRE 2 SENSOR) MISC Change sensor every 14 days 6 each 3   donepezil (ARICEPT) 10 MG tablet Take 1 tablet (10 mg total) by mouth at bedtime. 90 tablet 3   dorzolamide-timolol (COSOPT) 2-0.5 % ophthalmic solution 1 drop 2 (two) times daily.     insulin degludec (TRESIBA FLEXTOUCH) 100 UNIT/ML FlexTouch Pen Inject 14 Units into the skin at bedtime. 12 mL 3   Insulin Pen Needle (PEN NEEDLES) 31G X 6 MM MISC 1 each by Does not apply route at bedtime. 100 each 2    latanoprost (XALATAN) 0.005 % ophthalmic solution 1 drop at bedtime.     levETIRAcetam (KEPPRA) 500 MG tablet Take 1 tablet (500 mg total) by mouth 2 (two) times daily. 60 tablet 5   levofloxacin (LEVAQUIN) 750 MG tablet Take 1 tablet (750 mg total) by mouth daily for 4 days. 4 tablet 0   sitaGLIPtin (JANUVIA) 100 MG tablet Take 1 tablet (100 mg total) by mouth daily. 90 tablet 1   Vitamin D, Ergocalciferol, (DRISDOL) 1.25 MG (50000 UNIT) CAPS capsule Take 1 capsule (50,000 Units total) by mouth every 7 (seven) days. 5 capsule 8   No current facility-administered medications for this visit.     Objective: Vital signs in last 24 hours: BP 115/69   Pulse 81   Ht 5\' 5"  (1.651 m)   Wt 108 lb (49 kg)   BMI 17.97 kg/m   Intake/Output from previous day: No intake/output data recorded. Intake/Output this shift: @IOTHISSHIFT @   Physical Exam Vitals reviewed.  Constitutional:      Appearance: Normal appearance.  Cardiovascular:     Rate and Rhythm: Normal rate and regular rhythm.     Heart sounds: Normal heart sounds.  Pulmonary:     Effort: Pulmonary effort is normal.     Breath sounds: Normal breath sounds.  Abdominal:     General: Abdomen is flat.     Palpations: Abdomen is soft.     Tenderness: There is no abdominal tenderness.  Musculoskeletal:        General: No swelling or tenderness. Normal range of motion.  Skin:    General: Skin is warm and dry.  Neurological:     General: No focal deficit present.     Mental Status: He is alert and oriented to person, place, and time.     Lab Results:  No results found for this or any previous visit (  from the past 24 hour(s)).  BMET No results for input(s): "NA", "K", "CL", "CO2", "GLUCOSE", "BUN", "CREATININE", "CALCIUM" in the last 72 hours. PT/INR No results for input(s): "LABPROT", "INR" in the last 72 hours. ABG No results for input(s): "PHART", "HCO3" in the last 72 hours.  Invalid input(s): "PCO2", "PO2" Recent  Results (from the past 2160 hour(s))  HgB A1c     Status: Abnormal   Collection Time: 10/31/22  3:50 PM  Result Value Ref Range   Hemoglobin A1C 8.8 (A) 4.0 - 5.6 %   HbA1c POC (<> result, manual entry)     HbA1c, POC (prediabetic range)     HbA1c, POC (controlled diabetic range)    Urinalysis, w/ Reflex to Culture (Infection Suspected) -Urine, Clean Catch     Status: Abnormal   Collection Time: 12/17/22 12:08 PM  Result Value Ref Range   Specimen Source URINE, CLEAN CATCH    Color, Urine AMBER (A) YELLOW    Comment: BIOCHEMICALS MAY BE AFFECTED BY COLOR   APPearance HAZY (A) CLEAR   Specific Gravity, Urine 1.014 1.005 - 1.030   pH 6.0 5.0 - 8.0   Glucose, UA >=500 (A) NEGATIVE mg/dL   Hgb urine dipstick LARGE (A) NEGATIVE   Bilirubin Urine NEGATIVE NEGATIVE   Ketones, ur NEGATIVE NEGATIVE mg/dL   Protein, ur 562 (A) NEGATIVE mg/dL   Nitrite NEGATIVE NEGATIVE   Leukocytes,Ua NEGATIVE NEGATIVE   RBC / HPF >50 0 - 5 RBC/hpf   WBC, UA 21-50 0 - 5 WBC/hpf    Comment:        Reflex urine culture not performed if WBC <=10, OR if Squamous epithelial cells >5. If Squamous epithelial cells >5 suggest recollection.    Bacteria, UA NONE SEEN NONE SEEN   Squamous Epithelial / HPF 0-5 0 - 5 /HPF    Comment: Performed at [facility], [address] [place], [name] [address]  Urine Culture     Status: Abnormal   Collection Time: 12/17/22 12:08 PM   Specimen: Urine, Random  Result Value Ref Range   Specimen Description      URINE, RANDOM Performed at [facility], [address], [place], [name] [address]    Special Requests      NONE Reflexed from [id number] Performed at [facility], [address] [place], [name] [address]    Culture MULTIPLE SPECIES PRESENT, SUGGEST RECOLLECTION (A)    Report Status 12/19/2022 FINAL   Basic metabolic panel     Status: Abnormal   Collection Time: 12/17/22  3:23 PM  Result Value Ref Range   Sodium 134 (L) 135 - 145 mmol/L   Potassium 4.1 3.5 -  5.1 mmol/L   Chloride 100 98 - 111 mmol/L   CO2 26 22 - 32 mmol/L   Glucose, Bld 274 (H) 70 - 99 mg/dL    Comment: Glucose reference range applies only to samples taken after fasting for at least 8 hours.   BUN 18 8 - 23 mg/dL   Creatinine, Ser 4.40 (H) 0.61 - 1.24 mg/dL   Calcium 8.3 (L) 8.9 - 10.3 mg/dL   GFR, Estimated 57 (L) >60 mL/min    Comment: (NOTE) Calculated using the CKD-EPI Creatinine Equation (2021)    Anion gap 8 5 - 15    Comment: Performed at [facility], [address], [place], [name] [address]  CBG monitoring, ED     Status: Abnormal   Collection Time: 12/17/22  3:32 PM  Result Value Ref Range  Glucose-Capillary 253 (H) 70 - 99 mg/dL    Comment: Glucose reference range applies only to samples taken after fasting for at least 8 hours.    Studies/Results: No results found. CT ABDOMEN PELVIS W CONTRAST  Result Date: 12/17/2022 CLINICAL DATA:  Hematuria and abdominal pain. EXAM: CT ABDOMEN AND PELVIS WITH CONTRAST TECHNIQUE: Multidetector CT imaging of the abdomen and pelvis was performed using the standard protocol following bolus administration of intravenous contrast. RADIATION DOSE REDUCTION: This exam was performed according to the departmental dose-optimization program which includes automated exposure control, adjustment of the mA and/or kV according to patient size and/or use of iterative reconstruction technique. CONTRAST:  75mL OMNIPAQUE IOHEXOL 300 MG/ML  SOLN COMPARISON:  May 03, 2021 FINDINGS: Lower chest: Left lower lobe patchy airspace consolidation. Hepatobiliary: No focal liver abnormality is seen. No gallstones, gallbladder wall thickening, or biliary dilatation. Pancreas: Unremarkable. No pancreatic ductal dilatation or surrounding inflammatory changes. Spleen: Normal in size without focal abnormality. Adrenals/Urinary Tract: Normal adrenal glands. Normal right kidney. 1 cm partially obstructive stone in the pelvis of the left kidney. Minimal left  hydronephrosis. Diffuse circumferential mucosal thickening of the urinary bladder. Stomach/Bowel: Stomach is within normal limits. No evidence of bowel wall thickening, distention, or inflammatory changes. Vascular/Lymphatic: Aortic atherosclerosis. No enlarged abdominal or pelvic lymph nodes. Reproductive: Minimal enlargement of the prostate gland. Other: No abdominal wall hernia or abnormality. No abdominopelvic ascites. Musculoskeletal: Severe compression deformity of L1 vertebral body with approximately 80-90% height loss. No evidence of retropulsion. This finding is stable from January 2023. Healed complex fracture of the right iliac bone. IMPRESSION: 1. 1 cm partially obstructive stone in the pelvis of the left kidney. Minimal left hydronephrosis. 2. Diffuse circumferential mucosal thickening of the urinary bladder. This may be seen with cystitis. 3. Left lower lobe patchy airspace consolidation, concerning for pneumonia. 4. Severe compression deformity of L1 vertebral body with approximately 80-90% height loss. No evidence of retropulsion. This finding is stable from January 2023. 5. Healed complex fracture of the right iliac bone. 6. Aortic atherosclerosis. Aortic Atherosclerosis (ICD10-I70.0). Electronically Signed   By: Ted Mcalpine M.D.   On: 12/17/2022 16:44   CT HEAD WO CONTRAST ( )  Result Date: 10/13/2022 CLINICAL DATA:  Follow-up subdural hematoma. EXAM: CT HEAD WITHOUT CONTRAST TECHNIQUE: Contiguous axial images were obtained from the base of the skull through the vertex without intravenous contrast. RADIATION DOSE REDUCTION: This exam was performed according to the departmental dose-optimization program which includes automated exposure control, adjustment of the mA and/or kV according to patient size and/or use of iterative reconstruction technique. COMPARISON:  Head CT 07/13/2022 FINDINGS: Brain: The intermediate density subdural hematoma over the left cerebral convexity has slightly  further decreased in size, now measuring up to 4 mm in thickness. There is no midline shift or other significant mass effect. No acute infarct is evident. Mild cerebral atrophy is within normal limits for age. Patchy hypodensities in the cerebral white matter bilaterally are unchanged and nonspecific but compatible with moderate chronic small vessel ischemic disease. Vascular: Calcified atherosclerosis at the skull base. Prior left middle meningeal artery embolization. Skull: No fracture or suspicious osseous lesion. Sinuses/Orbits: Visualized paranasal sinuses and mastoid air cells are clear. Bilateral cataract extraction. Other: None. IMPRESSION: Further slightly decreased size of the left-sided subdural hematoma. No midline shift. Electronically Signed   By: Sebastian Ache M.D.   On: 10/13/2022 16:09     Assessment/Plan: 1cm left renal stone without obstruction.  The stone is about 300HU on CT  but  visible on KUB.  I discussed both ESWL and Ureteroscopy as options and reviewed the risks and benefits of both in detail.  I will get him scheduled for ESWL but will make sure Dr. Ronne Binning has an opportunity to look at the films and pivot to ureteroscopy if need be.    No orders of the defined types were placed in this encounter.    No orders of the defined types were placed in this encounter.    Return for Next available schedule lithotripsy with routine f/u.Marland Kitchen    CC: Dr. Syliva Overman.      Jacob Barnes 12/22/2022

## 2022-12-28 ENCOUNTER — Other Ambulatory Visit: Payer: Self-pay

## 2022-12-28 DIAGNOSIS — N200 Calculus of kidney: Secondary | ICD-10-CM

## 2022-12-29 ENCOUNTER — Encounter (HOSPITAL_COMMUNITY)
Admission: RE | Admit: 2022-12-29 | Discharge: 2022-12-29 | Disposition: A | Discharge status: Home / Self Care | Payer: Medicare HMO | Source: Ambulatory Visit | Attending: Urology

## 2023-01-02 ENCOUNTER — Encounter (HOSPITAL_COMMUNITY)
Admission: RE | Disposition: A | Discharge status: Home / Self Care | Payer: Self-pay | Source: Home / Self Care | Attending: Urology

## 2023-01-02 ENCOUNTER — Ambulatory Visit (HOSPITAL_COMMUNITY): Payer: Medicare HMO

## 2023-01-02 ENCOUNTER — Ambulatory Visit (HOSPITAL_COMMUNITY)
Admission: RE | Admit: 2023-01-02 | Discharge: 2023-01-02 | Disposition: A | Discharge status: Home / Self Care | Payer: Medicare HMO | Attending: Urology | Admitting: Urology

## 2023-01-02 ENCOUNTER — Encounter (HOSPITAL_COMMUNITY): Payer: Self-pay | Admitting: Urology

## 2023-01-02 DIAGNOSIS — E1122 Type 2 diabetes mellitus with diabetic chronic kidney disease: Secondary | ICD-10-CM | POA: Diagnosis not present

## 2023-01-02 DIAGNOSIS — J189 Pneumonia, unspecified organism: Secondary | ICD-10-CM | POA: Diagnosis not present

## 2023-01-02 DIAGNOSIS — N189 Chronic kidney disease, unspecified: Secondary | ICD-10-CM | POA: Diagnosis not present

## 2023-01-02 DIAGNOSIS — I129 Hypertensive chronic kidney disease with stage 1 through stage 4 chronic kidney disease, or unspecified chronic kidney disease: Secondary | ICD-10-CM | POA: Insufficient documentation

## 2023-01-02 DIAGNOSIS — N201 Calculus of ureter: Secondary | ICD-10-CM

## 2023-01-02 DIAGNOSIS — N132 Hydronephrosis with renal and ureteral calculous obstruction: Secondary | ICD-10-CM | POA: Diagnosis present

## 2023-01-02 HISTORY — PX: EXTRACORPOREAL SHOCK WAVE LITHOTRIPSY: SHX1557

## 2023-01-02 LAB — GLUCOSE, CAPILLARY: Glucose-Capillary: 144 mg/dL — ABNORMAL HIGH (ref 70–99)

## 2023-01-02 SURGERY — EXTRACORPOREAL SHOCK WAVE LITHOTRIPSY (ESWL)
Anesthesia: LOCAL | Laterality: Left

## 2023-01-02 MED ORDER — HYDROCODONE-ACETAMINOPHEN 5-325 MG PO TABS: 1.0000 | ORAL_TABLET | Freq: Four times a day (QID) | ORAL | 0 refills | Status: DC | PRN

## 2023-01-02 MED ORDER — DIAZEPAM 5 MG PO TABS: 10.0000 mg | ORAL_TABLET | ORAL | Status: AC

## 2023-01-02 MED ORDER — DIAZEPAM 5 MG PO TABS
ORAL_TABLET | ORAL | Status: AC
  Administered 2023-01-02: 10 mg via ORAL
  Filled 2023-01-02: qty 2

## 2023-01-02 MED ORDER — ONDANSETRON HCL 4 MG PO TABS: 4.0000 mg | ORAL_TABLET | Freq: Every day | ORAL | 1 refills | Status: DC | PRN

## 2023-01-02 MED ORDER — DIPHENHYDRAMINE HCL 25 MG PO CAPS
25.0000 mg | ORAL_CAPSULE | ORAL | Status: AC
  Administered 2023-01-02: 25 mg via ORAL
  Filled 2023-01-02: qty 1

## 2023-01-02 MED ORDER — TAMSULOSIN HCL 0.4 MG PO CAPS: 0.4000 mg | ORAL_CAPSULE | Freq: Every day | ORAL | 0 refills | Status: DC

## 2023-01-02 MED ORDER — SODIUM CHLORIDE 0.9 % IV SOLN: INTRAVENOUS | Status: DC

## 2023-01-02 NOTE — Interval H&P Note (Signed)
History and Physical Interval Note:  01/02/2023 8:42 AM  Jacob Barnes  has presented today for surgery, with the diagnosis of left ureteral stone.  The various methods of treatment have been discussed with the patient and family. After consideration of risks, benefits and other options for treatment, the patient has consented to  Procedure(s): EXTRACORPOREAL SHOCK WAVE LITHOTRIPSY (ESWL) (Left) as a surgical intervention.  The patient's history has been reviewed, patient examined, no change in status, stable for surgery.  I have reviewed the patient's chart and labs.  Questions were answered to the patient's satisfaction.     Wilkie Aye

## 2023-01-09 ENCOUNTER — Encounter (HOSPITAL_COMMUNITY): Payer: Self-pay | Admitting: Urology

## 2023-01-10 ENCOUNTER — Telehealth: Payer: Self-pay

## 2023-01-10 NOTE — Telephone Encounter (Signed)
Patient's daughter call and states that patient was passing blood after litho. Daughter is made aware for patient to increase fluids. Daughter voiced understanding.

## 2023-01-14 ENCOUNTER — Other Ambulatory Visit: Payer: Self-pay | Admitting: Family Medicine

## 2023-01-18 ENCOUNTER — Other Ambulatory Visit: Payer: Self-pay | Admitting: Urology

## 2023-01-18 DIAGNOSIS — N201 Calculus of ureter: Secondary | ICD-10-CM

## 2023-01-18 NOTE — Progress Notes (Signed)
Name: Jacob Barnes DOB: 01/26/1941 MRN: [id number]  Diagnoses: 1) Post-operative state  HPI: Jacob Barnes presents post-operatively. He is accompanied by his daughter Jacob Barnes. He underwent left ESWL procedure on 01/02/2023 by Dr. Ronne Binning for management of a left proximal stone.  Postop course: KUB today: Awaiting radiology read; no stone fragments appreciated.  Today He denies seeing any stones pass but he is having gross hematuria with some small clots so it's been hard to tell. He denies flank pain / abdominal pain. He denies increased urinary urgency, frequency, nocturia, dysuria, hesitancy, straining to void, or sensations of incomplete emptying.   Fall Screening: Do you usually have a device to assist in your mobility? Yes - cane   Medications: Current Outpatient Medications  Medication Sig Dispense Refill   sulfamethoxazole-trimethoprim (BACTRIM DS) 800-160 MG tablet Take 1 tablet by mouth every 12 (twelve) hours for 5 days. 10 tablet 0   amLODipine (NORVASC) 5 MG tablet TAKE 1 TABLET (5 MG TOTAL) BY MOUTH DAILY. 90 tablet 1   atorvastatin (LIPITOR) 10 MG tablet GIVE 1 TABLET BY MOUTH AT BEDTIME EVERY MONDAY/WEDNESDAY/FRIDAY FOR HIGH CHOLESTEROL 45 tablet 1   blood glucose meter kit and supplies Dispense based on patient and insurance preference. Use up to two times daily as directed. (FOR ICD-10 E10.9, E11.9). 1 each 0   Continuous Glucose Sensor (FREESTYLE LIBRE 2 SENSOR) MISC Change sensor every 14 days 6 each 3   donepezil (ARICEPT) 10 MG tablet Take 1 tablet (10 mg total) by mouth at bedtime. 90 tablet 3   dorzolamide-timolol (COSOPT) 2-0.5 % ophthalmic solution 1 drop 2 (two) times daily.     HYDROcodone-acetaminophen (NORCO) 5-325 MG tablet Take 1 tablet by mouth every 6 (six) hours as needed for moderate pain. 30 tablet 0   insulin degludec (TRESIBA FLEXTOUCH) 100 UNIT/ML FlexTouch Pen Inject 14 Units into the skin at bedtime. 12 mL 3   Insulin Pen Needle (PEN NEEDLES) 31G  X 6 MM MISC 1 each by Does not apply route at bedtime. 100 each 2   latanoprost (XALATAN) 0.005 % ophthalmic solution 1 drop at bedtime.     levETIRAcetam (KEPPRA) 500 MG tablet TAKE 1 TABLET BY MOUTH TWICE A DAY 180 tablet 1   ondansetron (ZOFRAN) 4 MG tablet Take 1 tablet (4 mg total) by mouth daily as needed for nausea or vomiting. 30 tablet 1   sitaGLIPtin (JANUVIA) 100 MG tablet Take 1 tablet (100 mg total) by mouth daily. 90 tablet 1   tamsulosin (FLOMAX) 0.4 MG CAPS capsule Take 1 capsule (0.4 mg total) by mouth daily after supper. 30 capsule 0   Vitamin D, Ergocalciferol, (DRISDOL) 1.25 MG (50000 UNIT) CAPS capsule Take 1 capsule (50,000 Units total) by mouth every 7 (seven) days. 5 capsule 8   No current facility-administered medications for this visit.    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   Qualifier: Diagnosis  of  By: Lodema Hong MD, Margaret     GERD (gastroesophageal reflux disease)    Glaucoma    both eyes   History of kidney stones    Hyperlipemia    Hypertension    Iron deficiency anemia 01/10/2014   Other pancytopenia (HCC) 01/10/2014   New in 01/2014, undergoing pathology review    Rash    front abdomen no drainage   Past Surgical History:  Procedure Laterality Date   AMPUTATION Right 09/01/2016   Procedure: AMPUTATION RIGHT MIDDLE FINGER TIP;  Surgeon: Kathryne Hitch, MD;  Location: [facility];  Service: Orthopedics;  Laterality: Right;   AMPUTATION Right 10/10/2016   Procedure:  REPEAT IRRIGATION AND DEBRIDEMENT RIGHT MIDDLE FINGER WITH AMPUTATION THROUGH PROXIMAL PHALANX;  Surgeon: Kathryne Hitch, MD;  Location: [facility];  Service: Orthopedics;  Laterality: Right;   APPENDECTOMY     BIOPSY  01/13/2022   Procedure: BIOPSY;  Surgeon: Dolores Frame, MD;  Location: [facility];  Service: Gastroenterology;;   COLONOSCOPY  09/06/2011   Procedure: COLONOSCOPY;  Surgeon: Malissa Hippo, MD;  Location: [facility];  Service: Endoscopy;  Laterality: N/A;  830   COLONOSCOPY WITH PROPOFOL N/A 01/13/2022   Procedure: COLONOSCOPY WITH PROPOFOL;  Surgeon: Dolores Frame, MD;  Location: [facility];  Service: Gastroenterology;  Laterality: N/A;  200 ASA 3   CYSTOSCOPY/RETROGRADE/URETEROSCOPY/STONE EXTRACTION WITH BASKET     ESOPHAGOGASTRODUODENOSCOPY N/A 01/15/2014   Procedure: ESOPHAGOGASTRODUODENOSCOPY (EGD);  Surgeon: Malissa Hippo, MD;  Location: [facility];  Service: Endoscopy;  Laterality: N/A;  200   ESOPHAGOGASTRODUODENOSCOPY N/A 04/22/2014   Procedure: ESOPHAGOGASTRODUODENOSCOPY (EGD);  Surgeon: Malissa Hippo, MD;  Location: [facility];  Service: Endoscopy;  Laterality: N/A;  240   EXTRACORPOREAL SHOCK WAVE LITHOTRIPSY Left 01/02/2023   Procedure: EXTRACORPOREAL SHOCK WAVE LITHOTRIPSY (ESWL);  Surgeon: Malen Gauze, MD;  Location: [facility];  Service: Urology;  Laterality: Left;   EYE SURGERY Bilateral yrs ago   ioc for cataract   IR ANGIO EXTERNAL CAROTID SEL EXT CAROTID UNI L MOD SED  03/22/2022   IR ANGIO INTRA EXTRACRAN SEL INTERNAL CAROTID UNI L MOD SED  03/26/2022   IR ANGIOGRAM FOLLOW UP STUDY  03/26/2022   IR NEURO EACH ADD'L AFTER BASIC UNI LEFT (MS)  03/26/2022   IR TRANSCATH/EMBOLIZ  03/26/2022   MALONEY DILATION N/A 01/15/2014   Procedure: Elease Hashimoto DILATION;  Surgeon: Malissa Hippo, MD;  Location: [facility];  Service: Endoscopy;  Laterality: N/A;   MALONEY DILATION  04/22/2014   Procedure: Elease Hashimoto DILATION;   Surgeon: Malissa Hippo, MD;  Location: [facility];  Service: Endoscopy;;   POLYPECTOMY  01/13/2022   Procedure: POLYPECTOMY;  Surgeon: Marguerita Merles, Reuel Boom, MD;  Location: [facility];  Service: Gastroenterology;;   RADIOLOGY WITH ANESTHESIA Left 03/22/2022   Procedure: LEFT MMA EMBOLIZATION;  Surgeon: Lisbeth Renshaw, MD;  [facility];  Service: Radiology;  Laterality: Left;   surgery for left eye bleedf  2017   Family History  Problem Relation Age of Onset   Diabetes Mother    Hypertension Mother    Hyperlipidemia Mother    Stroke Mother    Heart failure Mother    Diabetes Father    Heart disease Father    Hypertension Father    Hyperlipidemia Father    Heart attack Father    Diabetes Brother    Diabetes Brother    Diabetes Son    Social History  Socioeconomic History   Marital status: Married    Spouse name: Not on file   Number of children: Not on file   Years of education: Not on file   Highest education level: Not on file  Occupational History   Not on file  Tobacco Use   Smoking status: Never    Passive exposure: Never   Smokeless tobacco: Never  Vaping Use   Vaping status: Never Used  Substance and Sexual Activity   Alcohol use: No   Drug use: No   Sexual activity: Yes  Other Topics Concern   Not on file  Social History Narrative   Not on file   Social Determinants of Health   Financial Resource Strain: Low Risk  (07/13/2022)   Overall Financial Resource Strain (CARDIA)    Difficulty of Paying Living Expenses: Not hard at all  Food Insecurity: No Food Insecurity (07/13/2022)   Hunger Vital Sign    Worried About Running Out of Food in the Last Year: Never true    Ran Out of Food in the Last Year: Never true  Transportation Needs: No Transportation Needs (07/13/2022)   PRAPARE - Administrator, Civil Service (Medical): No    Lack of Transportation (Non-Medical): No  Physical Activity: Sufficiently Active (07/13/2022)   Exercise  Vital Sign    Days of Exercise per Week: 4 days    Minutes of Exercise per Session: 60 min  Stress: No Stress Concern Present (07/13/2022)   [employer] of Occupational Health - Occupational Stress Questionnaire    Feeling of Stress : Not at all  Social Connections: Socially Integrated (07/13/2022)   Social Connection and Isolation Panel [NHANES]    Frequency of Communication with Friends and Family: More than three times a week    Frequency of Social Gatherings with Friends and Family: Once a week    Attends Religious Services: More than 4 times per year    Active Member of [employer] or Organizations: No    Attends Engineer, structural: 1 to 4 times per year    Marital Status: Married  Catering manager Violence: Not At Risk (07/13/2022)   Humiliation, Afraid, Rape, and Kick questionnaire    Fear of Current or Ex-Partner: No    Emotionally Abused: No    Physically Abused: No    Sexually Abused: No    SUBJECTIVE  Review of Systems Constitutional: Patient denies any unintentional weight loss or change in strength lntegumentary: Patient denies any rashes or pruritus Cardiovascular: Patient denies chest pain or syncope Respiratory: Patient denies shortness of breath Gastrointestinal: Patient denies nausea, vomiting, constipation, or diarrhea Musculoskeletal: Patient denies muscle cramps or weakness Neurologic: Patient denies convulsions or seizures Allergic/Immunologic: Patient denies recent allergic reaction(s) Hematologic/Lymphatic: Patient denies bleeding tendencies Endocrine: Patient denies heat/cold intolerance  GU: As per HPI.  OBJECTIVE There were no vitals filed for this visit. There is no height or weight on file to calculate BMI.  Physical Examination Constitutional: No obvious distress; patient is non-toxic appearing  Cardiovascular: No visible lower extremity edema.  Respiratory: The patient does not have audible wheezing/stridor; respirations do not appear  labored  Gastrointestinal: Abdomen non-distended Musculoskeletal: Normal ROM of UEs  Skin: No obvious rashes/open sores  Neurologic: CN 2-12 grossly intact Psychiatric: Answered questions appropriately with normal affect  Hematologic/Lymphatic/Immunologic: No obvious bruises or sites of spontaneous bleeding  UA: RBC TNTC  ASSESSMENT Left ureteral stone - Plan: Urinalysis, Routine w reflex microscopic, Basic metabolic panel, Urine Culture, CBC, sulfamethoxazole-trimethoprim (  BACTRIM DS) 800-160 MG tablet  Postop check - Plan: sulfamethoxazole-trimethoprim (BACTRIM DS) 800-160 MG tablet  Gross hematuria - Plan: Basic metabolic panel, Urine Culture, CBC, sulfamethoxazole-trimethoprim (BACTRIM DS) 800-160 MG tablet  We reviewed the operative procedures and findings. We reviewed recent imaging results; awaiting radiology results, appears to have no acute findings.   He is concerned about the gross hematuria - we agreed to send urine for culture and to treat empirically with Bactrim while awaiting culture results and sensitivities. Will also check renal function & CBC.  We discussed the risk for clot retention and pt was advised to increase fluid intake to thin out clots. Pt was advised to go to the ER if they become unable to urinate due to clot retention, start having symptoms of anemia (which were discussed), or any other significant concerning acute symptoms.   Will plan to follow up for recheck in 2 weeks. If he is doing much better they can cancel that and follow up in 6 months with KUB for routine stone surveillance. Pt & daughter verbalized understanding and agreement. All questions were answered.  PLAN Advised the following: 1. Urine culture 2. Bactrim 2x/day x5 days. 3. CBC & BMP. 4. Return in about 2 weeks (around 02/09/2023) for UA, PVR, & f/u with Evette Georges NP.   Orders Placed This Encounter  Procedures   Urine Culture   Urinalysis, Routine w reflex microscopic   Basic  metabolic panel   CBC    It has been explained that the patient is to follow regularly with their PCP in addition to all other providers involved in their care and to follow instructions provided by these respective offices. Patient advised to contact urology clinic if any urologic-pertaining questions, concerns, new symptoms or problems arise in the interim period.  Patient Instructions  >80% of stones are calcium oxalate. This type of stones forms when body either isn't clearing oxalate well enough, is making too much oxalate, or too little citrate. This results in oxalate binding to form crystals, which continue to aggregate and form stones.  Limiting calcium does not help, but limiting oxalate in the diet can help. Increasing citric acid intake may also help.  The following measures may help to prevent the recurrence of stones: Increase water intake to 2-2.5 liters per day May add citrus juice (lemon, lime or orange juice) to water Moderation in dairy foods Decrease in salt content 5. Low Oxalate diet: Oxylates are found in foods like Tomato, Spinach, red wine and chocolate (see additional resources below).  Internet resources for information regarding low oxalate diet: https://[email or web address].yangchunwu.com https://my.VerticalStretch.be  Foods Low in Sodium or Oxalate Foods You Can Eat  Drinks Coffee, fruit and veggie juice (using the recommended veggies), fruit punch  Fruits Apples, apricots (fresh or canned), avocado, bananas, cherries (sweet), cranberries, grapefruit, red or green grapes, lemon and lime juice, melons, nectarines, papayas, peaches, pears, pineapples, oranges, strawberries (fresh), tangerines  Veggies Artichokes, asparagus, bamboo shoots, broccoli, brussels sprouts, cabbage, cauliflower, chayote squash, chicory, corn, cucumbers, endive, lettuce, lima beans, mushrooms, onions,  peas, peppers, potatoes, radishes, rutabagas, zucchini  Breads, Cereals, Grains Egg noodles, rye bread, cooked and dry cereals without nuts or bran, crackers with unsalted tops, white or wild rice  Meat, Meat Replacements, Fish, Recruitment consultant, fish, poultry, eggs, egg whites, egg replacements  Soup Homemade soup (using the recommended veggies and meat), low-sodium bouillon, low-sodium canned  Desserts Cookies, cakes, ice cream, pudding without chocolate or nuts, candy without chocolate or nuts  Fats and  Oils Butter, margarine, cream, oil, salad dressing, mayo  Other Foods Unsalted potato chips or pretzels, herbs (like garlic, garlic powder, onion powder), lemon juice, salt-free seasoning blends, vinegar  Other Foods Low in Oxalate Foods You Can Eat  Drinks Beer, cola, wine, buttermilk, lemonade or limeade (without added vitamin C), milk  Meat, Meat Replacements, Fish, [employer] meat, ham, bacon, hot dogs, bratwurst, sausage, chicken nuggets, cheddar cheese, canned fish and shellfish  Soup Tomato soup, cheese soup  Other Foods Coconuts, lemon or lime juices, sugar or sweeteners, jellies or jams (from the recommended list)   Moderate-Oxalate Foods Foods to Limit   Drinks Fruit and veggie juices (from the list below), chocolate milk, rice milk, hot cocoa, tea   Fruits Blackberries, blueberries, black currants, cherries (sour), fruit cocktail, mangoes, orange peel, prunes, purple plums   Veggies Baked beans, carrots, celery, green beans, parsnips, summer squash, tomatoes, turnips   Breads, Cereals, Grains White bread, cornbread or cornmeal, white English muffins, saltine or soda crackers, brown rice, vanilla wafers, spaghetti and other noodles, firm tofu, bagels, oatmeal   Meat/meat replacements, fish, poultry Sardines   Desserts Chocolate cake   Fats and Oils Macadamia nuts, pistachio nuts, English walnuts   Other Foods Jams or jellies (made with the fruits above), pepper    High-Oxalate  Foods Foods to Avoid Drinks Chocolate drink mixes, soy milk, Ovaltine, instant iced tea, fruit juices of fruits listed below Fruits Apricots (dried), red currants, figs, kiwi, plums, rhubarb Veggies Beans (wax, dried), beets and beet greens, chives, collard greens, eggplant, escarole, dark greens of all kinds, leeks, okra, parsley, rutabagas, spinach, Swiss chard, tomato paste, watercress Breads, Cereals, Grains Amaranth, barley, white corn flour, fried potatoes, fruitcake, grits, soybean products, sweet potatoes, wheat germ and bran, buckwheat flour, All Bran cereal, graham crackers, pretzels, whole wheat bread Meat/meat replacements, fish, poultry  Dried beans, peanut butter, soy burgers, miso  Desserts Carob, chocolate, marmalades Fats and Oils Nuts (peanuts, almonds, pecans, cashews, hazelnuts), nut butters, sesame seeds, tahini paste Other Foods Poppy seeds   Electronically signed by:  Donnita Falls, MSN, FNP-C, CUNP 01/26/2023 2:07 PM

## 2023-01-19 ENCOUNTER — Telehealth: Payer: Self-pay

## 2023-01-19 NOTE — Telephone Encounter (Signed)
-----   Message from Donnita Falls sent at 01/18/2023  4:56 PM EDT ----- Please advise pt to get KUB prior to postop visit on Friday 01/26/23

## 2023-01-19 NOTE — Telephone Encounter (Signed)
Left detailed message making patient aware

## 2023-01-24 ENCOUNTER — Other Ambulatory Visit: Payer: Self-pay | Admitting: Urology

## 2023-01-25 ENCOUNTER — Encounter: Payer: Medicare HMO | Admitting: Urology

## 2023-01-26 ENCOUNTER — Ambulatory Visit: Payer: Medicare HMO | Admitting: Urology

## 2023-01-26 ENCOUNTER — Ambulatory Visit (HOSPITAL_COMMUNITY)
Admission: RE | Admit: 2023-01-26 | Discharge: 2023-01-26 | Disposition: A | Discharge status: Home / Self Care | Payer: Medicare HMO | Source: Ambulatory Visit | Attending: Urology | Admitting: Urology

## 2023-01-26 DIAGNOSIS — Z09 Encounter for follow-up examination after completed treatment for conditions other than malignant neoplasm: Secondary | ICD-10-CM

## 2023-01-26 DIAGNOSIS — N201 Calculus of ureter: Secondary | ICD-10-CM

## 2023-01-26 DIAGNOSIS — R310 Gross hematuria: Secondary | ICD-10-CM

## 2023-01-26 DIAGNOSIS — Z87442 Personal history of urinary calculi: Secondary | ICD-10-CM

## 2023-01-26 LAB — URINALYSIS, ROUTINE W REFLEX MICROSCOPIC
Specific Gravity, UA: 1.02 (ref 1.005–1.030)
pH, UA: 6.5 (ref 5.0–7.5)

## 2023-01-26 MED ORDER — SULFAMETHOXAZOLE-TRIMETHOPRIM 800-160 MG PO TABS
1.0000 | ORAL_TABLET | Freq: Two times a day (BID) | ORAL | 0 refills | Status: AC
Start: 2023-01-26 — End: 2023-01-31

## 2023-01-26 NOTE — Patient Instructions (Signed)

## 2023-01-28 LAB — CBC
Hematocrit: 26.2 % — ABNORMAL LOW (ref 37.5–51.0)
Hemoglobin: 8.2 g/dL — ABNORMAL LOW (ref 13.0–17.7)
MCH: 30.8 pg (ref 26.6–33.0)
MCHC: 31.3 g/dL — ABNORMAL LOW (ref 31.5–35.7)
MCV: 99 fL — ABNORMAL HIGH (ref 79–97)
Platelets: 85 10*3/uL — CL (ref 150–450)
RBC: 2.66 x10E6/uL — CL (ref 4.14–5.80)
RDW: 15.1 % (ref 11.6–15.4)
WBC: 7.7 10*3/uL (ref 3.4–10.8)

## 2023-01-28 LAB — BASIC METABOLIC PANEL
BUN/Creatinine Ratio: 7 — ABNORMAL LOW (ref 10–24)
BUN: 11 mg/dL (ref 8–27)
CO2: 28 mmol/L (ref 20–29)
Calcium: 8.5 mg/dL — ABNORMAL LOW (ref 8.6–10.2)
Chloride: 100 mmol/L (ref 96–106)
Creatinine, Ser: 1.54 mg/dL — ABNORMAL HIGH (ref 0.76–1.27)
Glucose: 289 mg/dL — ABNORMAL HIGH (ref 70–99)
Potassium: 4.1 mmol/L (ref 3.5–5.2)
Sodium: 136 mmol/L (ref 134–144)
eGFR: 44 mL/min/{1.73_m2} — ABNORMAL LOW (ref >59)

## 2023-01-29 ENCOUNTER — Other Ambulatory Visit: Payer: Self-pay | Admitting: Urology

## 2023-01-29 DIAGNOSIS — D500 Iron deficiency anemia secondary to blood loss (chronic): Secondary | ICD-10-CM

## 2023-01-29 DIAGNOSIS — R310 Gross hematuria: Secondary | ICD-10-CM

## 2023-01-29 LAB — URINE CULTURE

## 2023-01-29 NOTE — Progress Notes (Signed)
Spoke with patient's daughter Jacob Barnes via phone. She was advised that his labs from Friday 01/26/2023 showed worsened anemia which is most likely from his gross hematuria. She reported that as of Friday after the gross hematuria had resolved and has not occurred since then. She said that Jacob Barnes has not had any shortness of breath, dyspnea on exertion, chest pain, dizziness, syncope, increased confusion, increased weakness, or any other acutely concerning new symptoms. She was advised that his urine culture was negative. Advised repeat labs today for recheck, however she does not reside with patient (who lives in [place] with his wife) and states that he will most likely be unable to obtain transportation to get blood work done prior to Thursday 02/01/2023 when she goes to take him for his Endocrinology appointment. She was advised to contact EMS to transport Jacob Barnes to the closest ER if he develops any new significantly concerning symptoms prior to Thursday. She verbalized understanding and agreement. All questions were answered.

## 2023-02-01 ENCOUNTER — Ambulatory Visit: Payer: Medicare HMO | Admitting: Nurse Practitioner

## 2023-02-01 ENCOUNTER — Encounter: Payer: Self-pay | Admitting: Nurse Practitioner

## 2023-02-01 VITALS — BP 110/70 | HR 64 | Ht 65.0 in | Wt 105.2 lb

## 2023-02-01 DIAGNOSIS — E1159 Type 2 diabetes mellitus with other circulatory complications: Secondary | ICD-10-CM

## 2023-02-01 DIAGNOSIS — E782 Mixed hyperlipidemia: Secondary | ICD-10-CM

## 2023-02-01 DIAGNOSIS — I10 Essential (primary) hypertension: Secondary | ICD-10-CM

## 2023-02-01 DIAGNOSIS — Z794 Long term (current) use of insulin: Secondary | ICD-10-CM | POA: Diagnosis not present

## 2023-02-01 DIAGNOSIS — Z7984 Long term (current) use of oral hypoglycemic drugs: Secondary | ICD-10-CM

## 2023-02-01 MED ORDER — FREESTYLE LIBRE 2 SENSOR MISC: 3 refills | Status: DC

## 2023-02-01 MED ORDER — TRESIBA FLEXTOUCH 100 UNIT/ML ~~LOC~~ SOPN: 14.0000 [IU] | PEN_INJECTOR | Freq: Every day | SUBCUTANEOUS | 3 refills | Status: DC

## 2023-02-01 MED ORDER — SITAGLIPTIN PHOSPHATE 100 MG PO TABS: 100.0000 mg | ORAL_TABLET | Freq: Every day | ORAL | 1 refills | Status: DC

## 2023-02-01 NOTE — Progress Notes (Signed)
Endocrinology Follow Up Visit       02/01/2023, 4:21 PM   Subjective:    Patient ID: Jacob Barnes, male    DOB: 1940/09/19.  Jacob Barnes is being seen in follow up after being seen in consultation for management of currently uncontrolled symptomatic diabetes requested by  Kerri Perches, MD.   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   Anemia    Arthritis    hands   Carpal tunnel syndrome 08/05/2009   Qualifier: Diagnosis of  By: Romeo Apple MD, [name]     Chronic kidney disease    Diabetes mellitus    says since 1979 type 2   Diabetes mellitus without complication (HCC)    Phreesia 04/03/2020   ERECTILE DYSFUNCTION, ORGANIC 01/20/2009   Qualifier: Diagnosis of  By: Lodema Hong MD, Margaret     GERD (gastroesophageal reflux disease)    Glaucoma    both eyes   History of kidney stones    Hyperlipemia    Hypertension    Iron deficiency anemia 01/10/2014   Other pancytopenia (HCC) 01/10/2014   New in 01/2014, undergoing pathology review    Rash    front abdomen no drainage    Past Surgical History:  Procedure Laterality Date   AMPUTATION Right 09/01/2016   Procedure: AMPUTATION RIGHT MIDDLE FINGER TIP;  Surgeon: Kathryne Hitch, MD;  Location: [facility];  Service: Orthopedics;  Laterality: Right;   AMPUTATION Right 10/10/2016   Procedure: REPEAT IRRIGATION AND DEBRIDEMENT RIGHT MIDDLE FINGER WITH AMPUTATION THROUGH PROXIMAL PHALANX;  Surgeon: Kathryne Hitch, MD;  Location: [facility];  Service: Orthopedics;  Laterality: Right;   APPENDECTOMY     BIOPSY  01/13/2022   Procedure: BIOPSY;  Surgeon: Dolores Frame, MD;  Location: [facility];  Service: Gastroenterology;;   COLONOSCOPY  09/06/2011   Procedure: COLONOSCOPY;  Surgeon: Malissa Hippo, MD;  Location: [facility];  Service:  Endoscopy;  Laterality: N/A;  830   COLONOSCOPY WITH PROPOFOL N/A 01/13/2022   Procedure: COLONOSCOPY WITH PROPOFOL;  Surgeon: Dolores Frame, MD;  Location: [facility];  Service: Gastroenterology;  Laterality: N/A;  200 ASA 3   CYSTOSCOPY/RETROGRADE/URETEROSCOPY/STONE EXTRACTION WITH BASKET     ESOPHAGOGASTRODUODENOSCOPY N/A 01/15/2014   Procedure: ESOPHAGOGASTRODUODENOSCOPY (EGD);  Surgeon: Malissa Hippo, MD;  [facility]: [facility];  Service: Endoscopy;  Laterality: N/A;  200   ESOPHAGOGASTRODUODENOSCOPY N/A 04/22/2014   Procedure: ESOPHAGOGASTRODUODENOSCOPY (EGD);  Surgeon: Malissa Hippo, MD;  [facility]: [facility];  Service: Endoscopy;  Laterality: N/A;  240   EXTRACORPOREAL SHOCK WAVE LITHOTRIPSY Left 01/02/2023   Procedure: EXTRACORPOREAL SHOCK WAVE LITHOTRIPSY (ESWL);  Surgeon: Malen Gauze, MD;  Location: [facility];  Service: Urology;  Laterality: Left;   EYE SURGERY Bilateral yrs ago   ioc for cataract   IR ANGIO EXTERNAL CAROTID SEL EXT CAROTID UNI L MOD SED  03/22/2022   IR ANGIO INTRA EXTRACRAN SEL INTERNAL CAROTID UNI L MOD SED  03/26/2022   IR ANGIOGRAM FOLLOW UP STUDY  03/26/2022   IR NEURO  EACH ADD'L AFTER BASIC UNI LEFT (MS)  03/26/2022   IR TRANSCATH/EMBOLIZ  03/26/2022   MALONEY DILATION N/A 01/15/2014   Procedure: Elease Hashimoto DILATION;  Surgeon: Malissa Hippo, MD;  [facility];  Service: Endoscopy;  Laterality: N/A;   MALONEY DILATION  04/22/2014   Procedure: Elease Hashimoto DILATION;  Surgeon: Malissa Hippo, MD;  Location: [facility];  Service: Endoscopy;;   POLYPECTOMY  01/13/2022   Procedure: POLYPECTOMY;  Surgeon: Marguerita Merles, Reuel Boom, MD;  Location: [facility];  Service: Gastroenterology;;   RADIOLOGY WITH ANESTHESIA Left 03/22/2022   Procedure: LEFT MMA EMBOLIZATION;  Surgeon: Lisbeth Renshaw, MD;  Location: [facility];  Service: Radiology;  Laterality: Left;   surgery for left eye bleedf  2017    Social History   Socioeconomic  History   Marital status: Married    Spouse name: Not on file   Number of children: Not on file   Years of education: Not on file   Highest education level: Not on file  Occupational History   Not on file  Tobacco Use   Smoking status: Never    Passive exposure: Never   Smokeless tobacco: Never  Vaping Use   Vaping status: Never Used  Substance and Sexual Activity   Alcohol use: No   Drug use: No   Sexual activity: Yes  Other Topics Concern   Not on file  Social History Narrative   Not on file   Social Determinants of Health   Financial Resource Strain: Low Risk  (07/13/2022)   Overall Financial Resource Strain (CARDIA)    Difficulty of Paying Living Expenses: Not hard at all  Food Insecurity: No Food Insecurity (07/13/2022)   Hunger Vital Sign    Worried About Running Out of Food in the Last Year: Never true    Ran Out of Food in the Last Year: Never true  Transportation Needs: No Transportation Needs (07/13/2022)   PRAPARE - Administrator, Civil Service (Medical): No    Lack of Transportation (Non-Medical): No  Physical Activity: Sufficiently Active (07/13/2022)   Exercise Vital Sign    Days of Exercise per Week: 4 days    Minutes of Exercise per Session: 60 min  Stress: No Stress Concern Present (07/13/2022)   [employer] of Occupational Health - Occupational Stress Questionnaire    Feeling of Stress : Not at all  Social Connections: Socially Integrated (07/13/2022)   Social Connection and Isolation Panel [NHANES]    Frequency of Communication with Friends and Family: More than three times a week    Frequency of Social Gatherings with Friends and Family: Once a week    Attends Religious Services: More than 4 times per year    Active Member of [employer] or Organizations: No    Attends Engineer, structural: 1 to 4 times per year    Marital Status: Married    Family History  Problem Relation Age of Onset   Diabetes Mother    Hypertension Mother     Hyperlipidemia Mother    Stroke Mother    Heart failure Mother    Diabetes Father    Heart disease Father    Hypertension Father    Hyperlipidemia Father    Heart attack Father    Diabetes Brother    Diabetes Brother    Diabetes Son     Outpatient Encounter Medications as of 02/01/2023  Medication Sig   amLODipine (NORVASC) 5 MG tablet TAKE 1 TABLET (5 MG  TOTAL) BY MOUTH DAILY.   atorvastatin (LIPITOR) 10 MG tablet GIVE 1 TABLET BY MOUTH AT BEDTIME EVERY MONDAY/WEDNESDAY/FRIDAY FOR HIGH CHOLESTEROL   blood glucose meter kit and supplies Dispense based on patient and insurance preference. Use up to two times daily as directed. (FOR ICD-10 E10.9, E11.9).   donepezil (ARICEPT) 10 MG tablet Take 1 tablet (10 mg total) by mouth at bedtime.   dorzolamide-timolol (COSOPT) 2-0.5 % ophthalmic solution 1 drop 2 (two) times daily.   HYDROcodone-acetaminophen (NORCO) 5-325 MG tablet Take 1 tablet by mouth every 6 (six) hours as needed for moderate pain.   Insulin Pen Needle (PEN NEEDLES) 31G X 6 MM MISC 1 each by Does not apply route at bedtime.   latanoprost (XALATAN) 0.005 % ophthalmic solution 1 drop at bedtime.   levETIRAcetam (KEPPRA) 500 MG tablet TAKE 1 TABLET BY MOUTH TWICE A DAY   ondansetron (ZOFRAN) 4 MG tablet Take 1 tablet (4 mg total) by mouth daily as needed for nausea or vomiting.   tamsulosin (FLOMAX) 0.4 MG CAPS capsule TAKE 1 CAPSULE BY MOUTH EVERY DAY AFTER SUPPER   Vitamin D, Ergocalciferol, (DRISDOL) 1.25 MG (50000 UNIT) CAPS capsule Take 1 capsule (50,000 Units total) by mouth every 7 (seven) days.   [DISCONTINUED] Continuous Glucose Sensor (FREESTYLE LIBRE 2 SENSOR) MISC Change sensor every 14 days   [DISCONTINUED] insulin degludec (TRESIBA FLEXTOUCH) 100 UNIT/ML FlexTouch Pen Inject 14 Units into the skin at bedtime.   [DISCONTINUED] sitaGLIPtin (JANUVIA) 100 MG tablet Take 1 tablet (100 mg total) by mouth daily.   Continuous Glucose Sensor (FREESTYLE LIBRE 2 SENSOR) MISC  Change sensor every 14 days   insulin degludec (TRESIBA FLEXTOUCH) 100 UNIT/ML FlexTouch Pen Inject 14 Units into the skin at bedtime.   sitaGLIPtin (JANUVIA) 100 MG tablet Take 1 tablet (100 mg total) by mouth daily.   [EXPIRED] sulfamethoxazole-trimethoprim (BACTRIM DS) 800-160 MG tablet Take 1 tablet by mouth every 12 (twelve) hours for 5 days.   No facility-administered encounter medications on file as of 02/01/2023.    ALLERGIES: Allergies  Allergen Reactions   Alpha-Gal      VACCINATION STATUS: Immunization History  Administered Date(s) Administered   Fluad Quad(high Dose 65+) 02/27/2019, 01/20/2021, 02/16/2022   Influenza,inj,Quad PF,6+ Mos 03/17/2013, 03/17/2014, 03/18/2015, 01/04/2016, 12/05/2016, 12/02/2019   Influenza-Unspecified 01/17/2017   Moderna Sars-Covid-2 Vaccination 06/23/2019, 07/21/2019, 02/12/2020   PPD Test 08/16/2010, 08/18/2010, 11/03/2013   Pneumococcal Conjugate-13 11/03/2013   Pneumococcal Polysaccharide-23 12/21/2008   Td 12/21/2008    Diabetes He presents for his follow-up diabetic visit. He has type 2 diabetes mellitus. Onset time: He was diagnosed at approximate age of 49 years. His disease course has been improving. There are no hypoglycemic associated symptoms. Pertinent negatives for hypoglycemia include no confusion, headaches, pallor or seizures. Associated symptoms include blurred vision and fatigue. Pertinent negatives for diabetes include no chest pain, no polydipsia, no polyphagia, no polyuria, no weakness and no weight loss. There are no hypoglycemic complications. Symptoms are stable. Diabetic complications include impotence, nephropathy, peripheral neuropathy and PVD. Risk factors for coronary artery disease include diabetes mellitus, dyslipidemia, hypertension, male sex and sedentary lifestyle. Current diabetic treatment includes oral agent (monotherapy) and insulin injections. He is compliant with treatment most of the time. His weight is  fluctuating minimally. He is following a generally healthy diet. When asked about meal planning, he reported none. He has not had a previous visit with a dietitian. He never participates in exercise. His home blood glucose trend is fluctuating minimally. His overall  blood glucose range is >200 mg/dl. (He presents today, accompanied by his daughter, with his CGM showing slightly above target glycemic profile.  His POCT A1c today is 7.6%, improving from last visit of 8.8%.    His daughter is helping him manage his health.  Analysis of his CGM shows TIR 30%, TAR 70%, TBR 0% with a GMI of 8.4%.  Daughter does adjust insulin dosage based on night time glucose readings (to avoid hypoglycemia).  He has been in the hospital for a kidney stone and for pneumonia recently.) An ACE inhibitor/angiotensin II receptor blocker is being taken. He does not see a podiatrist.Eye exam is current.  Hyperlipidemia This is a chronic problem. The current episode started more than 1 year ago. The problem is controlled. Recent lipid tests were reviewed and are normal. Exacerbating diseases include chronic renal disease and diabetes. There are no known factors aggravating his hyperlipidemia. Pertinent negatives include no chest pain, myalgias or shortness of breath. Current antihyperlipidemic treatment includes statins. The current treatment provides moderate improvement of lipids. Compliance problems include adherence to diet.  Risk factors for coronary artery disease include dyslipidemia, diabetes mellitus, hypertension, male sex, a sedentary lifestyle and family history.  Hypertension This is a chronic problem. The current episode started more than 1 year ago. The problem has been resolved since onset. The problem is controlled. Associated symptoms include blurred vision. Pertinent negatives include no chest pain, headaches, neck pain, palpitations or shortness of breath. There are no associated agents to hypertension. Risk factors for  coronary artery disease include diabetes mellitus, dyslipidemia, male gender and sedentary lifestyle. Past treatments include ACE inhibitors. The current treatment provides moderate improvement. There are no compliance problems.  Hypertensive end-organ damage includes kidney disease and PVD. Identifiable causes of hypertension include chronic renal disease.      Review of systems  Constitutional: + stable body weight,  current Body mass index is 17.51 kg/m. , no fatigue, no subjective hyperthermia, no subjective hypothermia, + decreased appetite Eyes: no blurry vision, no xerophthalmia ENT: no sore throat, no nodules palpated in throat, no dysphagia/odynophagia, no hoarseness Cardiovascular: no chest pain, no shortness of breath, no palpitations, no leg swelling Respiratory: no cough, no shortness of breath Gastrointestinal: no nausea/vomiting, + chronic diarrhea Musculoskeletal: no muscle/joint aches, reports multiple falls Skin: no rashes, no hyperemia Neurological: no tremors, no numbness, no tingling, no dizziness Psychiatric: no depression, no anxiety    Objective:    BP 110/70 (BP Location: Left Arm, Patient Position: Sitting, Cuff Size: Large)   Pulse 64   Ht 5\' 5"  (1.651 m)   Wt 105 lb 3.2 oz (47.7 kg)   BMI 17.51 kg/m   Wt Readings from Last 3 Encounters:  02/01/23 105 lb 3.2 oz (47.7 kg)  01/02/23 107 lb 12.9 oz (48.9 kg)  12/21/22 108 lb (49 kg)    BP Readings from Last 3 Encounters:  02/01/23 110/70  01/02/23 116/65  12/21/22 115/69     Physical Exam- Limited  Constitutional:  Body mass index is 17.51 kg/m. , not in acute distress, normal state of mind Eyes:  EOMI, no exophthalmos Musculoskeletal: missing tips of some fingers, strength intact in all four extremities, no gross restriction of joint movements Skin:  no rashes, no hyperemia Neurological: no tremor with outstretched hands   Diabetic Foot Exam - Simple   No data filed     CMP ( most  recent) CMP     Component Value Date/Time   NA 136 01/26/2023 1415  K 4.1 01/26/2023 1415   CL 100 01/26/2023 1415   CO2 28 01/26/2023 1415   GLUCOSE 289 (H) 01/26/2023 1415   GLUCOSE 274 (H) 12/17/2022 1523   BUN 11 01/26/2023 1415   CREATININE 1.54 (H) 01/26/2023 1415   CREATININE 1.36 (H) 09/30/2019 1008   CALCIUM 8.5 (L) 01/26/2023 1415   PROT 8.5 08/24/2022 0937   ALBUMIN 3.9 08/24/2022 0937   AST 36 08/24/2022 0937   ALT 10 08/24/2022 0937   ALKPHOS 82 08/24/2022 0937   BILITOT 0.3 08/24/2022 0937   GFRNONAA 57 (L) 12/17/2022 1523   GFRNONAA 49 (L) 09/30/2019 1008   GFRAA 59 (L) 03/31/2020 0848   GFRAA 57 (L) 09/30/2019 1008     Diabetic Labs (most recent): Lab Results  Component Value Date   HGBA1C 8.8 (A) 10/31/2022   HGBA1C 11.0 (H) 06/21/2022   HGBA1C 9.1 (A) 01/26/2022   MICROALBUR 150 07/14/2020   MICROALBUR 8.5 09/23/2018   MICROALBUR 127.6 (H) 06/20/2018     Lipid Panel ( most recent) Lipid Panel     Component Value Date/Time   CHOL 70 (L) 08/24/2022 0937   TRIG 43 08/24/2022 0937   HDL 39 (L) 08/24/2022 0937   CHOLHDL 1.8 08/24/2022 0937   CHOLHDL 2.6 09/30/2019 1008   VLDL 7 11/27/2016 1000   LDLCALC 19 08/24/2022 0937   LDLCALC 58 09/30/2019 1008   LABVLDL 12 08/24/2022 0937      Lab Results  Component Value Date   TSH 2.350 08/24/2022   TSH 0.905 03/29/2021   TSH 1.300 03/31/2020   TSH 1.77 09/23/2018   TSH 0.61 07/19/2015   TSH 0.952 03/18/2015   TSH 0.921 12/11/2013   TSH 1.392 10/29/2012   TSH 0.615 09/26/2011   TSH 1.573 12/21/2008   FREET4 1.26 03/31/2020      Assessment & Plan:   1) Uncontrolled type 2 diabetes mellitus with hyperglycemia (HCC)  - Jacob Barnes has currently uncontrolled symptomatic type 2 DM since  82 years of age.  He presents today, accompanied by his daughter, with his CGM showing slightly above target glycemic profile.  His POCT A1c today is 7.6%, improving from last visit of 8.8%.    His daughter  is helping him manage his health.  Analysis of his CGM shows TIR 30%, TAR 70%, TBR 0% with a GMI of 8.4%.  Daughter does adjust insulin dosage based on night time glucose readings (to avoid hypoglycemia).  He has been in the hospital for a kidney stone and for pneumonia recently.   Recent labs reviewed.  - I had a long discussion with him about the progressive nature of diabetes and the pathology behind its complications. -his diabetes is complicated by peripheral arterial disease and he remains at a high risk for more acute and chronic complications which include CAD, CVA, CKD, retinopathy, and neuropathy. These are all discussed in detail with him.  - Nutritional counseling repeated at each appointment due to patients tendency to fall back in to old habits.  - The patient admits there is a room for improvement in their diet and drink choices. -  Suggestion is made for the patient to avoid simple carbohydrates from their diet including Cakes, Sweet Desserts / Pastries, Ice Cream, Soda (diet and regular), Sweet Tea, Candies, Chips, Cookies, Sweet Pastries, Store Bought Juices, Alcohol in Excess of 1-2 drinks a day, Artificial Sweeteners, Coffee Creamer, and "Sugar-free" Products. This will help patient to have stable blood glucose profile and potentially avoid unintended  weight gain.   - I encouraged the patient to switch to unprocessed or minimally processed complex starch and increased protein intake (animal or plant source), fruits, and vegetables.   - Patient is advised to stick to a routine mealtimes to eat 3 meals a day and avoid unnecessary snacks (to snack only to correct hypoglycemia).  - I have approached him with the following individualized plan to manage  his diabetes and patient agrees:   -In light of his advanced age and light body weight, avoiding hypoglycemia should be the number 1 priority in his care.   -He is advised to continue his Tresiba 10 units SQ nightly (may cut dose in  half if glucose is less than 150) and Januvia 100 mg po daily.    -He is encouraged to continue monitoring blood glucose at least twice daily (using his CGM), before breakfast and before bed, and to call the clinic if he has readings less than 70 or above 300 for 3 tests in a row.    - he is not a candidate for Metformin due to concurrent renal insufficiency.  - he is not a candidate for incretin therapy due to his body habitus.   - Specific targets for  A1c;  LDL, HDL,  and Triglycerides were discussed with the patient.  2) Blood Pressure /Hypertension:   his blood pressure is controlled to target for his age.   he is advised to continue his current medications including Benazepril 10 mg p.o. daily with breakfast .  3) Lipids/Hyperlipidemia:    His most recent lipid panel from 08/24/22 shows controlled LDL at 19.   He is advised to continue Crestor 5 mg po daily at bedtime.  Side effects and precautions discussed with him.  4)  Weight/Diet:  His Body mass index is 17.51 kg/m.   he is not a candidate for weight loss.   Exercise, and detailed carbohydrates information provided  -  detailed on discharge instructions.  5) Osteopenia: -He will not need bisphosphonate intervention at this time.  He is advised to continue on his Vitamin D 4000 units daily, as well as Calcium carbonate 600 mg p.o. daily.  His PCP started him on Ergocalciferol 50000 units weekly.  6) Chronic Care/Health Maintenance: -he is on ACEI/ARB and Statin medications and is encouraged to initiate and continue to follow up with Ophthalmology, Dentist, Podiatrist at least yearly or according to recommendations, and advised to stay away from smoking. I have recommended yearly flu vaccine and pneumonia vaccine at least every 5 years; moderate intensity exercise for up to 150 minutes weekly; and  sleep for at least 7 hours a day.  - he is advised to maintain close follow up with Kerri Perches, MD for primary care needs, as  well as his other providers for optimal and coordinated care.     I spent  41  minutes in the care of the patient today including review of labs from CMP, Lipids, Thyroid Function, Hematology (current and previous including abstractions from other facilities); face-to-face time discussing  his blood glucose readings/logs, discussing hypoglycemia and hyperglycemia episodes and symptoms, medications doses, his options of short and long term treatment based on the latest standards of care / guidelines;  discussion about incorporating lifestyle medicine;  and documenting the encounter. Risk reduction counseling performed per USPSTF guidelines to reduce obesity and cardiovascular risk factors.     Please refer to Patient Instructions for Blood Glucose Monitoring and Insulin/Medications Dosing Guide"  in media tab  for additional information. Please  also refer to " Patient Self Inventory" in the Media  tab for reviewed elements of pertinent patient history.  Alda Lea participated in the discussions, expressed understanding, and voiced agreement with the above plans.  All questions were answered to his satisfaction. he is encouraged to contact clinic should he have any questions or concerns prior to his return visit.   Follow up plan: - Return in about 4 months (around 06/04/2023) for Diabetes F/U with A1c in office, No previsit labs, Bring meter and logs.  Ronny Bacon, [facility] [address] [place], [name] [address] Phone: [phone] Fax: [phone]  02/01/2023, 4:21 PM

## 2023-02-01 NOTE — Progress Notes (Deleted)
Name: Jacob Barnes DOB: [birth year] MRN: [id number]  History of Present Illness: [name] is a 82 y.o. male who presents today for follow up visit at [facility]. He is accompanied by his daughter [name], who assists with providing history due to patient's dementia.  - GU History: 1. Kidney stones.  Recent history:  > 01/02/2023: Underwent left ESWL procedure by Dr. Ronne Binning for management of a left proximal stone.   > 01/26/2023: Postop visit. - Denied seeing any stones pass but having gross hematuria with some small clots so hard to tell. Otherwise asymptomatic. - No stone fragments seen on KUB (unofficial interpretation).  - Patient & daughter were quite concerned about possible UTI due to the gross hematuria. Agreed to send urine for culture and to treat empirically with Bactrim. - Advised the following: 1. Urine culture (negative) 2. Bactrim 2x/day x5 days. 3. CBC & BMP. 4. Return in about 2 weeks (around 02/09/2023) for UA, PVR, & f/u with Evette Georges NP  Since last visit: > 01/29/2023: Spoke with daughter [name] via phone. She was advised that his labs from 01/26/2023 showed worsened anemia r/t his gross hematuria. She reported that as of Friday after the gross hematuria had resolved and has not occurred since then. She said that he has not had any symptoms of anemia. She was advised that his urine culture was negative. Advised repeat labs today for recheck, however she does not reside with patient (who lives in [place] with his wife) and states that he will most likely be unable to obtain transportation to get blood work done prior to Thursday 02/01/2023 when she goes to take him for his Endocrinology appointment. She was advised to contact EMS to transport Jacob Barnes to the closest ER if he develops any new significantly concerning symptoms prior.  ***was KUB from 10/18 read yet? (Not as of 10/30) ***has the gross hematuria continued to be  resolved? ***did he get the repeat CBC & BMP done? (Not as of 10/24)  Today: He {Actions; denies-reports:120008} recent stone passage. He {Actions; denies-reports:120008} flank pain or abdominal pain. He {Actions; denies-reports:120008} fevers, nausea, or vomiting.  He {Actions; denies-reports:120008} increased urinary urgency, frequency, nocturia, dysuria, gross hematuria, hesitancy, straining to void, or sensations of incomplete emptying.   Fall Screening: Do you usually have a device to assist in your mobility? {yes/no:20286}  ***cane / ***walker / ***wheelchair  Medications: Current Outpatient Medications  Medication Sig Dispense Refill   amLODipine (NORVASC) 5 MG tablet TAKE 1 TABLET (5 MG TOTAL) BY MOUTH DAILY. 90 tablet 1   atorvastatin (LIPITOR) 10 MG tablet GIVE 1 TABLET BY MOUTH AT BEDTIME EVERY MONDAY/WEDNESDAY/FRIDAY FOR HIGH CHOLESTEROL 45 tablet 1   blood glucose meter kit and supplies Dispense based on patient and insurance preference. Use up to two times daily as directed. (FOR ICD-10 E10.9, E11.9). 1 each 0   Continuous Glucose Sensor (FREESTYLE LIBRE 2 SENSOR) MISC Change sensor every 14 days 6 each 3   donepezil (ARICEPT) 10 MG tablet Take 1 tablet (10 mg total) by mouth at bedtime. 90 tablet 3   dorzolamide-timolol (COSOPT) 2-0.5 % ophthalmic solution 1 drop 2 (two) times daily.     HYDROcodone-acetaminophen (NORCO) 5-325 MG tablet Take 1 tablet by mouth every 6 (six) hours as needed for moderate pain. 30 tablet 0   insulin degludec (TRESIBA FLEXTOUCH) 100 UNIT/ML FlexTouch Pen Inject 14 Units into the skin at bedtime. 12 mL 3   Insulin Pen Needle (PEN NEEDLES) 31G X  6 MM MISC 1 each by Does not apply route at bedtime. 100 each 2   latanoprost (XALATAN) 0.005 % ophthalmic solution 1 drop at bedtime.     levETIRAcetam (KEPPRA) 500 MG tablet TAKE 1 TABLET BY MOUTH TWICE A DAY 180 tablet 1   ondansetron (ZOFRAN) 4 MG tablet Take 1 tablet (4 mg total) by mouth daily as  needed for nausea or vomiting. 30 tablet 1   sitaGLIPtin (JANUVIA) 100 MG tablet Take 1 tablet (100 mg total) by mouth daily. 90 tablet 1   tamsulosin (FLOMAX) 0.4 MG CAPS capsule TAKE 1 CAPSULE BY MOUTH EVERY DAY AFTER SUPPER 90 capsule 1   Vitamin D, Ergocalciferol, (DRISDOL) 1.25 MG (50000 UNIT) CAPS capsule Take 1 capsule (50,000 Units total) by mouth every 7 (seven) days. 5 capsule 8   No current facility-administered medications for this visit.    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   Qualifier: Diagnosis of  By: Lodema Hong MD, Margaret     GERD (gastroesophageal reflux disease)    Glaucoma    both eyes   History of kidney stones    Hyperlipemia    Hypertension    Iron deficiency anemia 01/10/2014   Other pancytopenia (HCC) 01/10/2014   New in 01/2014, undergoing pathology review    Rash    front abdomen no drainage   Past Surgical History:  Procedure Laterality Date   AMPUTATION Right 09/01/2016   Procedure: AMPUTATION RIGHT MIDDLE FINGER TIP;  Surgeon: Kathryne Hitch, MD;  Location: [facility];  Service: Orthopedics;  Laterality: Right;   AMPUTATION Right 10/10/2016   Procedure: REPEAT IRRIGATION AND DEBRIDEMENT RIGHT MIDDLE FINGER WITH AMPUTATION THROUGH PROXIMAL PHALANX;  Surgeon: Kathryne Hitch, MD;  Location: [facility];  Service: Orthopedics;  Laterality: Right;   APPENDECTOMY     BIOPSY  01/13/2022   Procedure: BIOPSY;  Surgeon: Dolores Frame, MD;  Location: [facility];   Service: Gastroenterology;;   COLONOSCOPY  09/06/2011   Procedure: COLONOSCOPY;  Surgeon: Malissa Hippo, MD;  Location: [facility];  Service: Endoscopy;  Laterality: N/A;  830   COLONOSCOPY WITH PROPOFOL N/A 01/13/2022   Procedure: COLONOSCOPY WITH PROPOFOL;  Surgeon: Dolores Frame, MD;  Location: [facility];  Service: Gastroenterology;  Laterality: N/A;  200 ASA 3   CYSTOSCOPY/RETROGRADE/URETEROSCOPY/STONE EXTRACTION WITH BASKET     ESOPHAGOGASTRODUODENOSCOPY N/A 01/15/2014   Procedure: ESOPHAGOGASTRODUODENOSCOPY (EGD);  Surgeon: Malissa Hippo, MD;  [facility]: [facility];  Service: Endoscopy;  Laterality: N/A;  200   ESOPHAGOGASTRODUODENOSCOPY N/A 04/22/2014   Procedure: ESOPHAGOGASTRODUODENOSCOPY (EGD);  Surgeon: Malissa Hippo, MD;  [facility]: [facility];  Service: Endoscopy;  Laterality: N/A;  240   EXTRACORPOREAL SHOCK WAVE LITHOTRIPSY Left 01/02/2023   Procedure: EXTRACORPOREAL SHOCK WAVE LITHOTRIPSY (ESWL);  Surgeon: Malen Gauze, MD;  Location: [facility];  Service: Urology;  Laterality: Left;   EYE SURGERY Bilateral yrs  ago   ioc for cataract   IR ANGIO EXTERNAL CAROTID SEL EXT CAROTID UNI L MOD SED  03/22/2022   IR ANGIO INTRA EXTRACRAN SEL INTERNAL CAROTID UNI L MOD SED  03/26/2022   IR ANGIOGRAM FOLLOW UP STUDY  03/26/2022   IR NEURO EACH ADD'L AFTER BASIC UNI LEFT (MS)  03/26/2022   IR TRANSCATH/EMBOLIZ  03/26/2022   MALONEY DILATION N/A 01/15/2014   Procedure: Elease Hashimoto DILATION;  Surgeon: Malissa Hippo, MD;  [facility];  Service: Endoscopy;  Laterality: N/A;   MALONEY DILATION  04/22/2014   Procedure: Elease Hashimoto DILATION;  Surgeon: Malissa Hippo, MD;  [facility];  Service: Endoscopy;;   POLYPECTOMY  01/13/2022   Procedure: POLYPECTOMY;  Surgeon: Marguerita Merles, Reuel Boom, MD;  Location: [facility];  Service: Gastroenterology;;   RADIOLOGY WITH ANESTHESIA Left 03/22/2022   Procedure: LEFT MMA EMBOLIZATION;  Surgeon: Lisbeth Renshaw, MD;  Location[facility] OR;  Service: Radiology;  Laterality: Left;   surgery for left eye bleedf  2017   Family History  Problem Relation Age of Onset   Diabetes Mother    Hypertension Mother    Hyperlipidemia Mother    Stroke Mother    Heart failure Mother    Diabetes Father    Heart disease Father    Hypertension Father    Hyperlipidemia Father    Heart attack Father    Diabetes Brother    Diabetes Brother    Diabetes Son    Social History   Socioeconomic History   Marital status: Married    Spouse name: Not on file   Number of children: Not on file   Years of education: Not on file   Highest education level: Not on file  Occupational History   Not on file  Tobacco Use   Smoking status: Never    Passive exposure: Never   Smokeless tobacco: Never  Vaping Use   Vaping status: Never Used  Substance and Sexual Activity   Alcohol use: No   Drug use: No   Sexual activity: Yes  Other Topics Concern   Not on file  Social History Narrative   Not on file   Social Determinants of Health   Financial Resource Strain: Low Risk  (07/13/2022)   Overall Financial Resource Strain (CARDIA)    Difficulty of Paying Living Expenses: Not hard at all  Food Insecurity: No Food Insecurity (07/13/2022)   Hunger Vital Sign    Worried About Running Out of Food in the Last Year: Never true    Ran Out of Food in the Last Year: Never true  Transportation Needs: No Transportation Needs (07/13/2022)   PRAPARE - Administrator, Civil Service (Medical): No    Lack of Transportation (Non-Medical): No  Physical Activity: Sufficiently Active (07/13/2022)   Exercise Vital Sign    Days of Exercise per Week: 4 days    Minutes of Exercise per Session: 60 min  Stress: No Stress Concern Present (07/13/2022)   [employer] of Occupational Health - Occupational Stress Questionnaire    Feeling of Stress : Not at all  Social Connections: Socially Integrated (07/13/2022)   Social Connection  and Isolation Panel [NHANES]    Frequency of Communication with Friends and Family: More than three times a week    Frequency of Social Gatherings with Friends and Family: Once a week    Attends Religious Services: More than 4 times per year    Active Member of [employer]  or Organizations: No    Attends Banker Meetings: 1 to 4 times per year    Marital Status: Married  Catering manager Violence: Not At Risk (07/13/2022)   Humiliation, Afraid, Rape, and Kick questionnaire    Fear of Current or Ex-Partner: No    Emotionally Abused: No    Physically Abused: No    Sexually Abused: No    SUBJECTIVE  Review of Systems*** Constitutional: Patient denies any unintentional weight loss or change in strength lntegumentary: Patient denies any rashes or pruritus Eyes: Patient denies ***dry eyes ENT: Patient ***denies dry mouth Cardiovascular: Patient denies chest pain or syncope Respiratory: Patient denies shortness of breath Gastrointestinal: Patient ***denies nausea, vomiting, constipation, or diarrhea Musculoskeletal: Patient denies muscle cramps or weakness Neurologic: Patient denies convulsions or seizures Allergic/Immunologic: Patient denies recent allergic reaction(s) Hematologic/Lymphatic: Patient denies bleeding tendencies Endocrine: Patient denies heat/cold intolerance  GU: As per HPI.  OBJECTIVE There were no vitals filed for this visit. There is no height or weight on file to calculate BMI.  Physical Examination*** Constitutional: No obvious distress; patient is non-toxic appearing  Cardiovascular: No visible lower extremity edema.  Respiratory: The patient does not have audible wheezing/stridor; respirations do not appear labored  Gastrointestinal: Abdomen non-distended Musculoskeletal: Normal ROM of UEs  Skin: No obvious rashes/open sores  Neurologic: CN 2-12 grossly intact Psychiatric: Answered questions appropriately with normal affect   Hematologic/Lymphatic/Immunologic: No obvious bruises or sites of spontaneous bleeding  UA: ***negative *** WBC/hpf, *** RBC/hpf, bacteria (***) PVR: *** ml  ASSESSMENT No diagnosis found.  ***We reviewed recent imaging results; ***awaiting radiology results, appears to have ***no acute findings.  ***For stone prevention: Advised adequate hydration and we discussed option to consider low oxalate diet given that calcium oxalate is the most common type of stone. Handout provided about stone prevention diet.  ***For recurrent stone formers: We discussed option to proceed with 24 hour urinalysis ([facility]) for metabolic evaluation, which may help with targeted recommendations for dietary I medication therapies for stone prevention. Patient elected to ***proceed/ ***hold off.  Will plan to follow up in ***6 months / ***1 year with ***KUB ***RUS for stone surveillance or sooner if needed.  Pt verbalized understanding and agreement. All questions were answered.  PLAN Advised the following: Maintain adequate fluid intake daily. Drink citrus juice (lemon, lime or orange juice) routinely. Low oxalate diet. No follow-ups on file.  No orders of the defined types were placed in this encounter.   It has been explained that the patient is to follow regularly with their PCP in addition to all other providers involved in their care and to follow instructions provided by these respective offices. Patient advised to contact urology clinic if any urologic-pertaining questions, concerns, new symptoms or problems arise in the interim period.  There are no Patient Instructions on file for this visit.  Electronically signed by:  Donnita Falls, MSN, FNP-C, CUNP 02/01/2023 2:11 PM

## 2023-02-09 ENCOUNTER — Ambulatory Visit: Payer: Medicare HMO | Admitting: Family Medicine

## 2023-02-09 ENCOUNTER — Telehealth: Payer: Self-pay | Admitting: Family Medicine

## 2023-02-09 ENCOUNTER — Other Ambulatory Visit: Payer: Self-pay

## 2023-02-09 ENCOUNTER — Encounter: Payer: Self-pay | Admitting: Family Medicine

## 2023-02-09 ENCOUNTER — Other Ambulatory Visit: Payer: Self-pay | Admitting: Family Medicine

## 2023-02-09 ENCOUNTER — Ambulatory Visit (HOSPITAL_COMMUNITY)
Admission: RE | Admit: 2023-02-09 | Discharge: 2023-02-09 | Disposition: A | Discharge status: Home / Self Care | Payer: Medicare HMO | Source: Ambulatory Visit | Attending: Family Medicine | Admitting: Family Medicine

## 2023-02-09 VITALS — BP 103/62 | HR 74 | Ht 65.0 in | Wt 101.1 lb

## 2023-02-09 DIAGNOSIS — R918 Other nonspecific abnormal finding of lung field: Secondary | ICD-10-CM

## 2023-02-09 DIAGNOSIS — E440 Moderate protein-calorie malnutrition: Secondary | ICD-10-CM

## 2023-02-09 DIAGNOSIS — Z0001 Encounter for general adult medical examination with abnormal findings: Secondary | ICD-10-CM

## 2023-02-09 DIAGNOSIS — R300 Dysuria: Secondary | ICD-10-CM

## 2023-02-09 DIAGNOSIS — E1165 Type 2 diabetes mellitus with hyperglycemia: Secondary | ICD-10-CM

## 2023-02-09 DIAGNOSIS — D649 Anemia, unspecified: Secondary | ICD-10-CM

## 2023-02-09 DIAGNOSIS — Z125 Encounter for screening for malignant neoplasm of prostate: Secondary | ICD-10-CM

## 2023-02-09 DIAGNOSIS — E1159 Type 2 diabetes mellitus with other circulatory complications: Secondary | ICD-10-CM

## 2023-02-09 DIAGNOSIS — E782 Mixed hyperlipidemia: Secondary | ICD-10-CM

## 2023-02-09 DIAGNOSIS — I10 Essential (primary) hypertension: Secondary | ICD-10-CM | POA: Diagnosis not present

## 2023-02-09 DIAGNOSIS — E559 Vitamin D deficiency, unspecified: Secondary | ICD-10-CM

## 2023-02-09 DIAGNOSIS — Z23 Encounter for immunization: Secondary | ICD-10-CM

## 2023-02-09 DIAGNOSIS — N1832 Chronic kidney disease, stage 3b: Secondary | ICD-10-CM

## 2023-02-09 DIAGNOSIS — E1065 Type 1 diabetes mellitus with hyperglycemia: Secondary | ICD-10-CM

## 2023-02-09 MED ORDER — AMLODIPINE BESYLATE 2.5 MG PO TABS: 2.5000 mg | ORAL_TABLET | Freq: Every day | ORAL | 3 refills | Status: DC

## 2023-02-09 NOTE — Telephone Encounter (Signed)
Radiology LVM wanting to bring attention to pt xray- call back # [phone]

## 2023-02-09 NOTE — Patient Instructions (Addendum)
F/U first week in December, call if tyou need me sooner  Flu vaccine today  New Lower dose of amlodipine since blood pressure is low, New is 2.5 mg daily, STOP amlodipine 5 mg tablet please  cBC, iron, ferritin, b12, cmp and EGFR, tSH, urine aCR, cCUA and PSA  You are referred to nutritionist due to weight loss   Good to hear that diabetes is improved  Examine your feet every day and wear shoes and socks to reduce risk of a cut   Best for the Season!  Thanks for choosing [facility], we consider it a privelige to serve you.

## 2023-02-09 NOTE — Progress Notes (Unsigned)
   Jacob Barnes     MRN: [id number]      DOB: February 08, 1941  Chief Complaint  Patient presents with   Annual Exam    CPE     HPI: Patient is in for annual physical exam. No other health concerns are expressed or addressed at the visit. Recent labs, if available are reviewed. Immunization is reviewed , and  updated if needed.    PE; Pleasant male, alert and oriented x 3, in no cardio-pulmonary distress. Afebrile. HEENT No facial trauma or asymetry. Sinuses non tender. EOMI External ears normal,  Neck: supple, no adenopathy,JVD or thyromegaly.No bruits.  Chest: Clear to ascultation bilaterally.No crackles or wheezes. Non tender to palpation  Cardiovascular system; Heart sounds normal,  S1 and  S2 ,no S3.  No murmur, or thrill. Apical beat not displaced Peripheral pulses normal.  Abdomen: Soft, non tender, no organomegaly or masses. No bruits. Bowel sounds normal. No guarding, tenderness or rebound.    Musculoskeletal exam: Full ROM of spine, hips , shoulders and knees. No deformity ,swelling or crepitus noted. No muscle wasting or atrophy.   Neurologic: Cranial nerves 2 to 12 intact. Power, tone ,sensation and reflexes normal throughout. No disturbance in gait. No tremor.  Skin: Intact, no ulceration, erythema , scaling or rash noted. Pigmentation normal throughout  Psych; Normal mood and affect. Judgement and concentration normal   Assessment & Plan:  No problem-specific Assessment & Plan notes found for this encounter.

## 2023-02-10 MED ORDER — VITAMIN D (ERGOCALCIFEROL) 1.25 MG (50000 UNIT) PO CAPS: 50000.0000 [IU] | ORAL_CAPSULE | ORAL | 8 refills | Status: DC

## 2023-02-11 DIAGNOSIS — E440 Moderate protein-calorie malnutrition: Secondary | ICD-10-CM | POA: Insufficient documentation

## 2023-02-11 DIAGNOSIS — R918 Other nonspecific abnormal finding of lung field: Secondary | ICD-10-CM | POA: Insufficient documentation

## 2023-02-11 DIAGNOSIS — Z23 Encounter for immunization: Secondary | ICD-10-CM | POA: Insufficient documentation

## 2023-02-11 DIAGNOSIS — Z0001 Encounter for general adult medical examination with abnormal findings: Secondary | ICD-10-CM | POA: Insufficient documentation

## 2023-02-11 LAB — URINE CULTURE

## 2023-02-11 NOTE — Assessment & Plan Note (Signed)
After obtaining informed consent, the vaccine is  administered , with no adverse effect noted at the time of administration.  

## 2023-02-11 NOTE — Assessment & Plan Note (Signed)
Rept cXR and now needs nipple marker to see if chest scan needed

## 2023-02-11 NOTE — Assessment & Plan Note (Signed)
Managed by Endo and improving reportedly Jacob Barnes is reminded of the importance of commitment to daily physical activity for 30 minutes or more, as able and the need to limit carbohydrate intake to 30 to 60 grams per meal to help with blood sugar control.   The need to take medication as prescribed, test blood sugar as directed, and to call between visits if there is a concern that blood sugar is uncontrolled is also discussed.   Jacob Barnes is reminded of the importance of daily foot exam, annual eye examination, and good blood sugar, blood pressure and cholesterol control.     Latest Ref Rng & Units 02/09/2023   10:10 AM 01/26/2023    2:15 PM 12/17/2022    3:23 PM 10/31/2022    3:50 PM 08/24/2022    9:37 AM  Diabetic Labs  HbA1c 4.0 - 5.6 %    8.8    Micro/Creat Ratio  WILL FOLLOW  P      Chol 100 - 199 mg/dL     70   HDL >40 mg/dL     39   Calc LDL 0 - 99 mg/dL     19   Triglycerides 0 - 149 mg/dL     43   Creatinine 9.81 - 1.27 mg/dL 1.91  4.78  2.95   6.21     P Preliminary result      02/09/2023    8:55 AM 02/01/2023    3:46 PM 01/02/2023    9:18 AM 01/02/2023    7:02 AM 01/02/2023    7:01 AM 12/21/2022    2:00 PM 12/17/2022    3:00 PM  BP/Weight  Systolic BP 103 110 116 93  115 119  Diastolic BP 62 70 65 58  69 71  Wt. (Lbs) 101.12 105.2   107.81 108   BMI 16.83 kg/m2 17.51 kg/m2   17.94 kg/m2 17.97 kg/m2       Latest Ref Rng & Units 01/26/2022    2:00 PM 01/17/2022   12:00 AM  Foot/eye exam completion dates  Eye Exam No Retinopathy  Retinopathy      Foot Form Completion  Done      This result is from an external source.

## 2023-02-11 NOTE — Assessment & Plan Note (Signed)
Refer to nephrology 

## 2023-02-11 NOTE — Assessment & Plan Note (Signed)
Annual exam as documented. . Regular seat belt use and home safety, is also discussed. Changes in health habits are decided on by the patient with goals and time frames  set for achieving them. Immunization  needs are specifically addressed at this visit.

## 2023-02-11 NOTE — Assessment & Plan Note (Signed)
Over corrected reduce amlodipine dose and re assess

## 2023-02-11 NOTE — Assessment & Plan Note (Signed)
Refer to Nutritionist for eval and management

## 2023-02-12 ENCOUNTER — Ambulatory Visit: Payer: Medicare HMO | Admitting: Urology

## 2023-02-12 ENCOUNTER — Other Ambulatory Visit: Payer: Self-pay | Admitting: Urology

## 2023-02-12 DIAGNOSIS — R310 Gross hematuria: Secondary | ICD-10-CM

## 2023-02-12 DIAGNOSIS — N201 Calculus of ureter: Secondary | ICD-10-CM

## 2023-02-12 DIAGNOSIS — Z09 Encounter for follow-up examination after completed treatment for conditions other than malignant neoplasm: Secondary | ICD-10-CM

## 2023-02-12 LAB — CMP14+EGFR
ALT: 22 [IU]/L (ref 0–44)
AST: 25 IU/L (ref 0–40)
Albumin: 4 g/dL (ref 3.7–4.7)
Alkaline Phosphatase: 79 [IU]/L (ref 44–121)
BUN/Creatinine Ratio: 9 — ABNORMAL LOW (ref 10–24)
BUN: 15 mg/dL (ref 8–27)
Bilirubin Total: 0.3 mg/dL (ref 0.0–1.2)
CO2: 20 mmol/L (ref 20–29)
Calcium: 9.1 mg/dL (ref 8.6–10.2)
Chloride: 105 mmol/L (ref 96–106)
Creatinine, Ser: 1.7 mg/dL — ABNORMAL HIGH (ref 0.76–1.27)
Globulin, Total: 4.5 g/dL (ref 1.5–4.5)
Glucose: 192 mg/dL — ABNORMAL HIGH (ref 70–99)
Potassium: 4.1 mmol/L (ref 3.5–5.2)
Sodium: 140 mmol/L (ref 134–144)
Total Protein: 8.5 g/dL (ref 6.0–8.5)
eGFR: 40 mL/min/{1.73_m2} — ABNORMAL LOW (ref >59)

## 2023-02-12 LAB — IRON: Iron: 45 ug/dL (ref 38–169)

## 2023-02-12 LAB — URINALYSIS
Bilirubin, UA: NEGATIVE
Glucose, UA: NEGATIVE
Ketones, UA: NEGATIVE
Leukocytes,UA: NEGATIVE
Nitrite, UA: NEGATIVE
Specific Gravity, UA: 1.03 (ref 1.005–1.030)
Urobilinogen, Ur: 0.2 mg/dL (ref 0.2–1.0)
pH, UA: 5.5 (ref 5.0–7.5)

## 2023-02-12 LAB — MICROALBUMIN / CREATININE URINE RATIO
Creatinine, Urine: 165.7 mg/dL
Microalb/Creat Ratio: 117 mg/g{creat} — ABNORMAL HIGH (ref 0–29)
Microalbumin, Urine: 193.7 ug/mL

## 2023-02-12 LAB — VITAMIN B12: Vitamin B-12: 1342 pg/mL — ABNORMAL HIGH (ref 232–1245)

## 2023-02-12 LAB — CBC
Hematocrit: 31.8 % — ABNORMAL LOW (ref 37.5–51.0)
Hemoglobin: 10.1 g/dL — ABNORMAL LOW (ref 13.0–17.7)
MCH: 31.2 pg (ref 26.6–33.0)
MCHC: 31.8 g/dL (ref 31.5–35.7)
MCV: 98 fL — ABNORMAL HIGH (ref 79–97)
Platelets: 101 10*3/uL — ABNORMAL LOW (ref 150–450)
RBC: 3.24 x10E6/uL — ABNORMAL LOW (ref 4.14–5.80)
RDW: 13.9 % (ref 11.6–15.4)
WBC: 5.6 10*3/uL (ref 3.4–10.8)

## 2023-02-12 LAB — PSA: Prostate Specific Ag, Serum: 1.3 ng/mL (ref 0.0–4.0)

## 2023-02-12 LAB — FERRITIN: Ferritin: 78 ng/mL (ref 30–400)

## 2023-02-12 LAB — TSH: TSH: 0.668 u[IU]/mL (ref 0.450–4.500)

## 2023-02-12 NOTE — Progress Notes (Signed)
Letter sent.

## 2023-02-14 ENCOUNTER — Ambulatory Visit (HOSPITAL_COMMUNITY)
Admission: RE | Admit: 2023-02-14 | Discharge: 2023-02-14 | Disposition: A | Discharge status: Home / Self Care | Payer: Medicare HMO | Source: Ambulatory Visit | Attending: Urology | Admitting: Urology

## 2023-02-14 ENCOUNTER — Ambulatory Visit (HOSPITAL_COMMUNITY)
Admission: RE | Admit: 2023-02-14 | Discharge: 2023-02-14 | Disposition: A | Discharge status: Home / Self Care | Payer: Medicare HMO | Source: Ambulatory Visit | Attending: Family Medicine | Admitting: Family Medicine

## 2023-02-14 DIAGNOSIS — N201 Calculus of ureter: Secondary | ICD-10-CM | POA: Insufficient documentation

## 2023-02-14 DIAGNOSIS — R918 Other nonspecific abnormal finding of lung field: Secondary | ICD-10-CM | POA: Insufficient documentation

## 2023-02-20 ENCOUNTER — Telehealth: Payer: Self-pay

## 2023-02-20 NOTE — Telephone Encounter (Signed)
Patient called and daughter answered phone. Daughter states she was not with patient at current time but will have patient contact the office concerning CT results and recommended treatment.

## 2023-02-20 NOTE — Telephone Encounter (Signed)
-----   Message from Donnita Falls sent at 02/15/2023  4:57 PM EST ----- Attempted to contact patient / family without success. Left VM at [phone] (M) requesting call back. The other number listed [phone] (H) is not in service.   When patient / family member calls back please let them know that his 8 mm distal left ureteral stone is still present per the CT done yesterday (02/14/2023). Dr. Ronne Binning was consulted and advised for him to be scheduled for ureteroscopic stone manipulation. If they are okay with that please let me know so I can submit surgery request, or if they prefer to discuss please ask for good call back number for that. ----- Message ----- From: Interface, Rad Results In Sent: 02/14/2023   1:33 PM EST To: Donnita Falls, FNP

## 2023-02-22 NOTE — Telephone Encounter (Signed)
A letter was sent today.

## 2023-02-22 NOTE — Progress Notes (Signed)
Letter sent.

## 2023-03-02 ENCOUNTER — Telehealth: Payer: Self-pay | Admitting: Urology

## 2023-03-02 ENCOUNTER — Encounter: Payer: Self-pay | Admitting: Urology

## 2023-03-02 NOTE — Progress Notes (Signed)
Spoke with patient's daughter Jacob Barnes via phone today. She was advised that his 8 mm distal left ureteral stone is still present per the CT done on 02/14/2023 and that Dr. Ronne Binning advised for Jacob Barnes to be scheduled for ureteroscopic stone manipulation surgery. We discussed that procedure and related risks. Also reviewed potential risks with non-treatment of the stone.  Jacob Barnes stated that Jacob Barnes' wife (Jacob Barnes) does not want him to have that done but that Jacob Barnes makes his own medical decisions. Patient has a documented history of dementia; it is unclear from today's conversation whether he has a medical POA and if so who that is (wife or daughter).   Jacob Barnes stated she will discuss the surgical recommendation with her father this weekend and notify our office of whether or not he elects to proceed with the left ureteroscopic stone manipulation procedure. If so, will submit surgery request. If not, will recommend follow up visit in office with repeat imaging.   She reports that [name] does not have a good phone number to call; she said the best contact is her cell phone [phone]).  All questions were answered.  Evette Georges, MSN, FNP-C, [facility] Nurse Practitioner [facility] [place]

## 2023-03-02 NOTE — Telephone Encounter (Signed)
Caller would not give details....wants Maralyn Sago to call today about Mr Aherne, stated it is urgent. [phone]

## 2023-03-07 ENCOUNTER — Telehealth: Payer: Self-pay | Admitting: Urology

## 2023-03-07 NOTE — Telephone Encounter (Signed)
Please see below.

## 2023-03-07 NOTE — Telephone Encounter (Signed)
I returned a call to [facility] and left a voicemail that Jacob Barnes was waiting for a decision on treatment.  I advised her to call back and provided our office hours for the holiday.

## 2023-03-07 NOTE — Telephone Encounter (Signed)
Jacob Barnes called she has been trying to reach Jacob Barnes to speak with her and she said no one is returning her call about his care.

## 2023-03-14 ENCOUNTER — Encounter: Payer: Self-pay | Admitting: Family Medicine

## 2023-03-14 ENCOUNTER — Ambulatory Visit (INDEPENDENT_AMBULATORY_CARE_PROVIDER_SITE_OTHER): Payer: Medicare HMO | Admitting: Family Medicine

## 2023-03-14 VITALS — BP 120/70 | HR 87 | Ht 65.0 in | Wt 105.0 lb

## 2023-03-14 DIAGNOSIS — E785 Hyperlipidemia, unspecified: Secondary | ICD-10-CM

## 2023-03-14 DIAGNOSIS — I10 Essential (primary) hypertension: Secondary | ICD-10-CM | POA: Diagnosis not present

## 2023-03-14 DIAGNOSIS — E1065 Type 1 diabetes mellitus with hyperglycemia: Secondary | ICD-10-CM

## 2023-03-14 DIAGNOSIS — F03A0 Unspecified dementia, mild, without behavioral disturbance, psychotic disturbance, mood disturbance, and anxiety: Secondary | ICD-10-CM | POA: Diagnosis not present

## 2023-03-14 DIAGNOSIS — Z794 Long term (current) use of insulin: Secondary | ICD-10-CM

## 2023-03-14 DIAGNOSIS — E118 Type 2 diabetes mellitus with unspecified complications: Secondary | ICD-10-CM

## 2023-03-14 DIAGNOSIS — N1832 Chronic kidney disease, stage 3b: Secondary | ICD-10-CM

## 2023-03-14 LAB — GLUCOSE, POCT (MANUAL RESULT ENTRY): POC Glucose: 204 mg/dL — AB (ref 70–99)

## 2023-03-14 NOTE — Patient Instructions (Signed)
Follow-up in 3-1/2 to 4 months, call if you need me sooner.  Blood pressure is excellent, continue current medication.  Random blood sugar is checked in the office today, it is important that you check your blood sugar regularly and the fasting blood sugar should be less than 150.  Best for 2025.  Vaccines are overdue and are recommended and are available at your pharmacy.  These are tetanus shingles vaccines and the COVID-vaccine.  An eye exam to assess for diabetic eye disease is needed please schedule this in the office at checkout if patient has not had it done this year.  Thanks for choosing [facility], we consider it a privelige to serve you.

## 2023-04-05 DIAGNOSIS — E118 Type 2 diabetes mellitus with unspecified complications: Secondary | ICD-10-CM | POA: Insufficient documentation

## 2023-04-05 NOTE — Progress Notes (Signed)
Jacob Barnes     MRN: [id number]      DOB: 1940-09-01  Chief Complaint  Patient presents with   Follow-up    Follow up    HPI [name] is here for follow up and re-evaluation of chronic medical conditions, medication management and review of any available recent lab and radiology data.  Preventive health is updated, specifically  Cancer screening and Immunization.   Questions or concerns regarding consultations or procedures which the PT has had in the interim are  addressed. The PT denies any adverse reactions to current medications since the last visit.  There are no new concerns.  There are no specific complaints  Denies polyuria, polydipsia, blurred vision , or hypoglycemic episodes.   ROS Denies recent fever or chills. Denies sinus pressure, nasal congestion, ear pain or sore throat. Denies chest congestion, productive cough or wheezing. Denies chest pains, palpitations and leg swelling Denies abdominal pain, nausea, vomiting,diarrhea or constipation.   Denies dysuria, frequency, hesitancy or incontinence. Denies  uncontrolled joint pain, swelling and limitation in mobility. Denies headaches, seizures, numbness, or tingling. Denies depression, anxiety or insomnia. Denies skin break down or rash.   PE  BP 120/70   Pulse 87   Ht 5\' 5"  (1.651 m)   Wt 105 lb (47.6 kg)   SpO2 92%   BMI 17.47 kg/m   Patient alert and oriented and in no cardiopulmonary distress.  HEENT: No facial asymmetry, EOMI,     Neck supple .  Chest: Clear to auscultation bilaterally.  CVS: S1, S2 no murmurs, no S3.Regular rate.  ABD: Soft non tender.   Ext: No edema  MS: Adequate ROM spine, shoulders, hips and knees.  Skin: Intact, no ulcerations or rash noted.  Psych: Good eye contact, normal affect. Memory intact not anxious or depressed appearing.  CNS: CN 2-12 intact, power,  normal throughout.no focal deficits noted.   Assessment & Plan  Essential hypertension,  benign Controlled, no change in medication   Diabetes mellitus with complication, with long-term current use of insulin (HCC) Diabetes associated with hypertension, hyperlipidemia, and neurological disease  [name] is reminded of the importance of commitment to daily physical activity for 30 minutes or more, as able and the need to limit carbohydrate intake to 30 to 60 grams per meal to help with blood sugar control.   The need to take medication as prescribed, test blood sugar as directed, and to call between visits if there is a concern that blood sugar is uncontrolled is also discussed.   [name] is reminded of the importance of daily foot exam, annual eye examination, and good blood sugar, blood pressure and cholesterol control.     Latest Ref Rng & Units 02/09/2023   10:10 AM 01/26/2023    2:15 PM 12/17/2022    3:23 PM 10/31/2022    3:50 PM 08/24/2022    9:37 AM  Diabetic Labs  HbA1c 4.0 - 5.6 %    8.8    Micro/Creat Ratio 0 - 29 mg/g creat 117       Chol 100 - 199 mg/dL     70   HDL >19 mg/dL     39   Calc LDL 0 - 99 mg/dL     19   Triglycerides 0 - 149 mg/dL     43   Creatinine 1.47 - 1.27 mg/dL 8.29  5.62  1.30   8.65       03/14/2023    9:37 AM  03/14/2023    8:54 AM 02/09/2023    8:55 AM 02/01/2023    3:46 PM 01/02/2023    9:18 AM 01/02/2023    7:02 AM 01/02/2023    7:01 AM  BP/Weight  Systolic BP 120 121 103 110 116 93   Diastolic BP 70 70 62 70 65 58   Wt. (Lbs)  105 101.12 105.2   107.81  BMI  17.47 kg/m2 16.83 kg/m2 17.51 kg/m2   17.94 kg/m2      Latest Ref Rng & Units 01/26/2022    2:00 PM 01/17/2022   12:00 AM  Foot/eye exam completion dates  Eye Exam No Retinopathy  Retinopathy      Foot Form Completion  Done      This result is from an external source.        Hyperlipidemia LDL goal <100 Hyperlipidemia:Low fat diet discussed and encouraged.   Lipid Panel  Lab Results  Component Value Date   CHOL 70 (L) 08/24/2022   HDL 39 (L) 08/24/2022    LDLCALC 19 08/24/2022   TRIG 43 08/24/2022   CHOLHDL 1.8 08/24/2022     No med change Updated lab needed at/ before next visit.   Dementia (HCC) Continue aricept at treating dose , he is tolerating this well  CKD (chronic kidney disease) stage 3, GFR 30-59 ml/min (HCC) Followed by Nephrology and stable Improved blood sugar necesary

## 2023-04-05 NOTE — Assessment & Plan Note (Signed)
Diabetes associated with hypertension, hyperlipidemia, and neurological disease  [name] is reminded of the importance of commitment to daily physical activity for 30 minutes or more, as able and the need to limit carbohydrate intake to 30 to 60 grams per meal to help with blood sugar control.   The need to take medication as prescribed, test blood sugar as directed, and to call between visits if there is a concern that blood sugar is uncontrolled is also discussed.   [name] is reminded of the importance of daily foot exam, annual eye examination, and good blood sugar, blood pressure and cholesterol control.     Latest Ref Rng & Units 02/09/2023   10:10 AM 01/26/2023    2:15 PM 12/17/2022    3:23 PM 10/31/2022    3:50 PM 08/24/2022    9:37 AM  Diabetic Labs  HbA1c 4.0 - 5.6 %    8.8    Micro/Creat Ratio 0 - 29 mg/g creat 117       Chol 100 - 199 mg/dL     70   HDL >65 mg/dL     39   Calc LDL 0 - 99 mg/dL     19   Triglycerides 0 - 149 mg/dL     43   Creatinine 7.84 - 1.27 mg/dL 6.96  2.95  2.84   1.32       03/14/2023    9:37 AM 03/14/2023    8:54 AM 02/09/2023    8:55 AM 02/01/2023    3:46 PM 01/02/2023    9:18 AM 01/02/2023    7:02 AM 01/02/2023    7:01 AM  BP/Weight  Systolic BP 120 121 103 110 116 93   Diastolic BP 70 70 62 70 65 58   Wt. (Lbs)  105 101.12 105.2   107.81  BMI  17.47 kg/m2 16.83 kg/m2 17.51 kg/m2   17.94 kg/m2      Latest Ref Rng & Units 01/26/2022    2:00 PM 01/17/2022   12:00 AM  Foot/eye exam completion dates  Eye Exam No Retinopathy  Retinopathy      Foot Form Completion  Done      This result is from an external source.

## 2023-04-05 NOTE — Assessment & Plan Note (Signed)
Hyperlipidemia:Low fat diet discussed and encouraged.   Lipid Panel  Lab Results  Component Value Date   CHOL 70 (L) 08/24/2022   HDL 39 (L) 08/24/2022   LDLCALC 19 08/24/2022   TRIG 43 08/24/2022   CHOLHDL 1.8 08/24/2022     No med change Updated lab needed at/ before next visit.

## 2023-04-05 NOTE — Assessment & Plan Note (Signed)
Controlled, no change in medication  

## 2023-04-05 NOTE — Assessment & Plan Note (Signed)
Continue aricept at treating dose , he is tolerating this well

## 2023-04-05 NOTE — Assessment & Plan Note (Signed)
Followed by Nephrology and stable Improved blood sugar necesary

## 2023-04-18 ENCOUNTER — Ambulatory Visit: Payer: Medicare HMO | Admitting: Urology

## 2023-04-24 ENCOUNTER — Ambulatory Visit: Payer: Medicare HMO | Admitting: Nutrition

## 2023-04-25 ENCOUNTER — Other Ambulatory Visit: Payer: Self-pay | Admitting: Family Medicine

## 2023-05-09 ENCOUNTER — Other Ambulatory Visit: Payer: Self-pay | Admitting: Family Medicine

## 2023-05-21 ENCOUNTER — Ambulatory Visit: Payer: Medicare HMO | Admitting: Urology

## 2023-06-04 ENCOUNTER — Ambulatory Visit (INDEPENDENT_AMBULATORY_CARE_PROVIDER_SITE_OTHER): Payer: Medicare HMO | Admitting: Nurse Practitioner

## 2023-06-04 ENCOUNTER — Encounter: Payer: Self-pay | Admitting: Nurse Practitioner

## 2023-06-04 VITALS — BP 102/62 | HR 60 | Ht 65.0 in | Wt 102.8 lb

## 2023-06-04 DIAGNOSIS — I10 Essential (primary) hypertension: Secondary | ICD-10-CM | POA: Diagnosis not present

## 2023-06-04 DIAGNOSIS — Z794 Long term (current) use of insulin: Secondary | ICD-10-CM | POA: Diagnosis not present

## 2023-06-04 DIAGNOSIS — E1159 Type 2 diabetes mellitus with other circulatory complications: Secondary | ICD-10-CM | POA: Diagnosis not present

## 2023-06-04 DIAGNOSIS — Z7984 Long term (current) use of oral hypoglycemic drugs: Secondary | ICD-10-CM

## 2023-06-04 DIAGNOSIS — E782 Mixed hyperlipidemia: Secondary | ICD-10-CM | POA: Diagnosis not present

## 2023-06-04 LAB — POCT GLYCOSYLATED HEMOGLOBIN (HGB A1C): Hemoglobin A1C: 12.3 % — AB (ref 4.0–5.6)

## 2023-06-04 MED ORDER — SITAGLIPTIN PHOSPHATE 100 MG PO TABS: 100.0000 mg | ORAL_TABLET | Freq: Every day | ORAL | 1 refills | Status: DC

## 2023-06-04 NOTE — Progress Notes (Signed)
 Endocrinology Follow Up Visit       06/04/2023, 5:07 PM   Subjective:    Patient ID: Jacob Barnes, male    DOB: 25-Jan-1941.  Jacob Barnes is being seen in follow up after being seen in consultation for management of currently uncontrolled symptomatic diabetes requested by  Kerri Perches, MD.   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   Anemia    Arthritis    hands   Carpal tunnel syndrome 08/05/2009   Qualifier: Diagnosis of  By: Romeo Apple MD, [name]     Chronic kidney disease    Diabetes mellitus    says since 1979 type 2   Diabetes mellitus without complication (HCC)    Phreesia 04/03/2020   ERECTILE DYSFUNCTION, ORGANIC 01/20/2009   Qualifier: Diagnosis of  By: Lodema Hong MD, Margaret     GERD (gastroesophageal reflux disease)    Glaucoma    both eyes   History of kidney stones    Hyperlipemia    Hypertension    Iron deficiency anemia 01/10/2014   Other pancytopenia (HCC) 01/10/2014   New in 01/2014, undergoing pathology review    Rash    front abdomen no drainage    Past Surgical History:  Procedure Laterality Date   AMPUTATION Right 09/01/2016   Procedure: AMPUTATION RIGHT MIDDLE FINGER TIP;  Surgeon: Kathryne Hitch, MD;  Location: [facility];  Service: Orthopedics;  Laterality: Right;   AMPUTATION Right 10/10/2016   Procedure: REPEAT IRRIGATION AND DEBRIDEMENT RIGHT MIDDLE FINGER WITH AMPUTATION THROUGH PROXIMAL PHALANX;  Surgeon: Kathryne Hitch, MD;  Location: [facility];  Service: Orthopedics;  Laterality: Right;   APPENDECTOMY     BIOPSY  01/13/2022   Procedure: BIOPSY;  Surgeon: Dolores Frame, MD;  Location: [facility];  Service: Gastroenterology;;   COLONOSCOPY  09/06/2011   Procedure: COLONOSCOPY;  Surgeon: Malissa Hippo, MD;  Location: [facility];  Service:  Endoscopy;  Laterality: N/A;  830   COLONOSCOPY WITH PROPOFOL N/A 01/13/2022   Procedure: COLONOSCOPY WITH PROPOFOL;  Surgeon: Dolores Frame, MD;  Location: [facility];  Service: Gastroenterology;  Laterality: N/A;  200 ASA 3   CYSTOSCOPY/RETROGRADE/URETEROSCOPY/STONE EXTRACTION WITH BASKET     ESOPHAGOGASTRODUODENOSCOPY N/A 01/15/2014   Procedure: ESOPHAGOGASTRODUODENOSCOPY (EGD);  Surgeon: Malissa Hippo, MD;  Location: [facility];  Service: Endoscopy;  Laterality: N/A;  200   ESOPHAGOGASTRODUODENOSCOPY N/A 04/22/2014   Procedure: ESOPHAGOGASTRODUODENOSCOPY (EGD);  Surgeon: Malissa Hippo, MD;  Location: [facility];  Service: Endoscopy;  Laterality: N/A;  240   EXTRACORPOREAL SHOCK WAVE LITHOTRIPSY Left 01/02/2023   Procedure: EXTRACORPOREAL SHOCK WAVE LITHOTRIPSY (ESWL);  Surgeon: Malen Gauze, MD;  Location: [facility];  Service: Urology;  Laterality: Left;   EYE SURGERY Bilateral yrs ago   ioc for cataract   IR ANGIO EXTERNAL CAROTID SEL EXT CAROTID UNI L MOD SED  03/22/2022   IR ANGIO INTRA EXTRACRAN SEL INTERNAL CAROTID UNI L MOD SED  03/26/2022   IR ANGIOGRAM FOLLOW UP STUDY  03/26/2022   IR NEURO  EACH ADD'L AFTER BASIC UNI LEFT (MS)  03/26/2022   IR TRANSCATH/EMBOLIZ  03/26/2022   MALONEY DILATION N/A 01/15/2014   Procedure: Elease Hashimoto DILATION;  Surgeon: Malissa Hippo, MD;  [facility];  Service: Endoscopy;  Laterality: N/A;   MALONEY DILATION  04/22/2014   Procedure: Elease Hashimoto DILATION;  Surgeon: Malissa Hippo, MD;  Location: [facility];  Service: Endoscopy;;   POLYPECTOMY  01/13/2022   Procedure: POLYPECTOMY;  Surgeon: Marguerita Merles, Reuel Boom, MD;  Location: [facility];  Service: Gastroenterology;;   RADIOLOGY WITH ANESTHESIA Left 03/22/2022   Procedure: LEFT MMA EMBOLIZATION;  Surgeon: Lisbeth Renshaw, MD;  Location: [facility];  Service: Radiology;  Laterality: Left;   surgery for left eye bleedf  2017    Social History   Socioeconomic  History   Marital status: Married    Spouse name: Not on file   Number of children: Not on file   Years of education: Not on file   Highest education level: Not on file  Occupational History   Not on file  Tobacco Use   Smoking status: Never    Passive exposure: Never   Smokeless tobacco: Never  Vaping Use   Vaping status: Never Used  Substance and Sexual Activity   Alcohol use: No   Drug use: No   Sexual activity: Yes  Other Topics Concern   Not on file  Social History Narrative   Not on file   Social Drivers of Health   Financial Resource Strain: Low Risk  (07/13/2022)   Overall Financial Resource Strain (CARDIA)    Difficulty of Paying Living Expenses: Not hard at all  Food Insecurity: No Food Insecurity (07/13/2022)   Hunger Vital Sign    Worried About Running Out of Food in the Last Year: Never true    Ran Out of Food in the Last Year: Never true  Transportation Needs: No Transportation Needs (07/13/2022)   PRAPARE - Administrator, Civil Service (Medical): No    Lack of Transportation (Non-Medical): No  Physical Activity: Sufficiently Active (07/13/2022)   Exercise Vital Sign    Days of Exercise per Week: 4 days    Minutes of Exercise per Session: 60 min  Stress: No Stress Concern Present (07/13/2022)   [employer] of Occupational Health - Occupational Stress Questionnaire    Feeling of Stress : Not at all  Social Connections: Socially Integrated (07/13/2022)   Social Connection and Isolation Panel [NHANES]    Frequency of Communication with Friends and Family: More than three times a week    Frequency of Social Gatherings with Friends and Family: Once a week    Attends Religious Services: More than 4 times per year    Active Member of [employer] or Organizations: No    Attends Engineer, structural: 1 to 4 times per year    Marital Status: Married    Family History  Problem Relation Age of Onset   Diabetes Mother    Hypertension Mother     Hyperlipidemia Mother    Stroke Mother    Heart failure Mother    Diabetes Father    Heart disease Father    Hypertension Father    Hyperlipidemia Father    Heart attack Father    Diabetes Brother    Diabetes Brother    Diabetes Son     Outpatient Encounter Medications as of 06/04/2023  Medication Sig   amLODipine (NORVASC) 2.5 MG tablet TAKE 1 TABLET BY MOUTH  EVERY DAY   atorvastatin (LIPITOR) 10 MG tablet GIVE 1 TABLET BY MOUTH AT BEDTIME EVERY MONDAY/WEDNESDAY/FRIDAY FOR HIGH CHOLESTEROL   blood glucose meter kit and supplies Dispense based on patient and insurance preference. Use up to two times daily as directed. (FOR ICD-10 E10.9, E11.9).   Continuous Glucose Sensor (FREESTYLE LIBRE 2 SENSOR) MISC Change sensor every 14 days   donepezil (ARICEPT) 10 MG tablet Take 1 tablet (10 mg total) by mouth at bedtime.   dorzolamide-timolol (COSOPT) 2-0.5 % ophthalmic solution 1 drop 2 (two) times daily.   insulin degludec (TRESIBA FLEXTOUCH) 100 UNIT/ML FlexTouch Pen Inject 14 Units into the skin at bedtime.   Insulin Pen Needle (PEN NEEDLES) 31G X 6 MM MISC 1 each by Does not apply route at bedtime.   latanoprost (XALATAN) 0.005 % ophthalmic solution 1 drop at bedtime.   levETIRAcetam (KEPPRA) 500 MG tablet TAKE 1 TABLET BY MOUTH TWICE A DAY   tamsulosin (FLOMAX) 0.4 MG CAPS capsule TAKE 1 CAPSULE BY MOUTH EVERY DAY AFTER SUPPER   Vitamin D, Ergocalciferol, (DRISDOL) 1.25 MG (50000 UNIT) CAPS capsule Take 1 capsule (50,000 Units total) by mouth every 7 (seven) days.   [DISCONTINUED] sitaGLIPtin (JANUVIA) 100 MG tablet Take 1 tablet (100 mg total) by mouth daily.   sitaGLIPtin (JANUVIA) 100 MG tablet Take 1 tablet (100 mg total) by mouth daily.   No facility-administered encounter medications on file as of 06/04/2023.    ALLERGIES: Allergies  Allergen Reactions   Alpha-Gal      VACCINATION STATUS: Immunization History  Administered Date(s) Administered   Fluad Quad(high Dose 65+)  02/27/2019, 01/20/2021, 02/16/2022   Fluad Trivalent(High Dose 65+) 02/09/2023   Influenza,inj,Quad PF,6+ Mos 03/17/2013, 03/17/2014, 03/18/2015, 01/04/2016, 12/05/2016, 12/02/2019   Influenza-Unspecified 01/17/2017   Moderna Sars-Covid-2 Vaccination 06/23/2019, 07/21/2019, 02/12/2020   PPD Test 08/16/2010, 08/18/2010, 11/03/2013   Pneumococcal Conjugate-13 11/03/2013   Pneumococcal Polysaccharide-23 12/21/2008   Td 12/21/2008    Diabetes He presents for his follow-up diabetic visit. He has type 2 diabetes mellitus. Onset time: He was diagnosed at approximate age of 49 years. His disease course has been worsening. There are no hypoglycemic associated symptoms. Pertinent negatives for hypoglycemia include no confusion, headaches, pallor or seizures. Associated symptoms include blurred vision, fatigue and weight loss. Pertinent negatives for diabetes include no chest pain, no polydipsia, no polyphagia, no polyuria and no weakness. There are no hypoglycemic complications. Symptoms are stable. Diabetic complications include impotence, nephropathy, peripheral neuropathy and PVD. Risk factors for coronary artery disease include diabetes mellitus, dyslipidemia, hypertension, male sex and sedentary lifestyle. Current diabetic treatment includes oral agent (monotherapy) and insulin injections. He is compliant with treatment some of the time. His weight is fluctuating minimally. He is following a generally healthy diet. When asked about meal planning, he reported none. He has not had a previous visit with a dietitian. He never participates in exercise. His home blood glucose trend is increasing steadily. His overall blood glucose range is >200 mg/dl. (He presents today, accompanied by his daughter, with his CGM showing gross hyperglycemia overall.  His POCT A1c today is 12.3%, increasing drastically from last visit of 7.6%.  He just got back to his daughters house from his spouses house.  Daughter notes he was  allowed to have more sweetened items while away.   Analysis of his CGM shows TIR 0%, TAR 100%, TBR 0% with time active only 25%.  ) An ACE inhibitor/angiotensin II receptor blocker is being taken. He does not see a podiatrist.Eye  exam is current.  Hyperlipidemia This is a chronic problem. The current episode started more than 1 year ago. The problem is controlled. Recent lipid tests were reviewed and are normal. Exacerbating diseases include chronic renal disease and diabetes. There are no known factors aggravating his hyperlipidemia. Pertinent negatives include no chest pain, myalgias or shortness of breath. Current antihyperlipidemic treatment includes statins. The current treatment provides moderate improvement of lipids. Compliance problems include adherence to diet.  Risk factors for coronary artery disease include dyslipidemia, diabetes mellitus, hypertension, male sex, a sedentary lifestyle and family history.  Hypertension This is a chronic problem. The current episode started more than 1 year ago. The problem has been resolved since onset. The problem is controlled. Associated symptoms include blurred vision. Pertinent negatives include no chest pain, headaches, neck pain, palpitations or shortness of breath. There are no associated agents to hypertension. Risk factors for coronary artery disease include diabetes mellitus, dyslipidemia, male gender and sedentary lifestyle. Past treatments include ACE inhibitors. The current treatment provides moderate improvement. There are no compliance problems.  Hypertensive end-organ damage includes kidney disease and PVD. Identifiable causes of hypertension include chronic renal disease.      Review of systems  Constitutional: + stable body weight,  current Body mass index is 17.11 kg/m. , + fatigue, no subjective hyperthermia, no subjective hypothermia, + decreased appetite Eyes: + blurry vision, right eye red with drainage, no xerophthalmia ENT: no sore  throat, no nodules palpated in throat, no dysphagia/odynophagia, no hoarseness Cardiovascular: no chest pain, no shortness of breath, no palpitations, no leg swelling Respiratory: no cough, no shortness of breath Gastrointestinal: no nausea/vomiting Musculoskeletal: no muscle/joint aches, reports multiple falls Skin: no rashes, no hyperemia Neurological: no tremors, no numbness, no tingling, no dizziness Psychiatric: no depression, no anxiety    Objective:    BP 102/62 (BP Location: Left Arm, Patient Position: Sitting, Cuff Size: Large)   Pulse 60   Ht 5\' 5"  (1.651 m)   Wt 102 lb 12.8 oz (46.6 kg)   BMI 17.11 kg/m   Wt Readings from Last 3 Encounters:  06/04/23 102 lb 12.8 oz (46.6 kg)  03/14/23 105 lb (47.6 kg)  02/09/23 101 lb 1.9 oz (45.9 kg)    BP Readings from Last 3 Encounters:  06/04/23 102/62  03/14/23 120/70  02/09/23 103/62     Physical Exam- Limited  Constitutional:  Body mass index is 17.11 kg/m. , not in acute distress, drowsy, very quiet today, not his usual bubbly self Eyes:  EOMI, no exophthalmos, right eye conjunctiva red with some drainage and lower eyelid drooping Musculoskeletal: missing tips of some fingers, strength intact in all four extremities, no gross restriction of joint movements Skin:  no rashes, no hyperemia Neurological: no tremor with outstretched hands   Diabetic Foot Exam - Simple   No data filed     CMP ( most recent) CMP     Component Value Date/Time   NA 140 02/09/2023 1010   K 4.1 02/09/2023 1010   CL 105 02/09/2023 1010   CO2 20 02/09/2023 1010   GLUCOSE 192 (H) 02/09/2023 1010   GLUCOSE 274 (H) 12/17/2022 1523   BUN 15 02/09/2023 1010   CREATININE 1.70 (H) 02/09/2023 1010   CREATININE 1.36 (H) 09/30/2019 1008   CALCIUM 9.1 02/09/2023 1010   PROT 8.5 02/09/2023 1010   ALBUMIN 4.0 02/09/2023 1010   AST 25 02/09/2023 1010   ALT 22 02/09/2023 1010   ALKPHOS 79 02/09/2023 1010   BILITOT 0.3  02/09/2023 1010    GFRNONAA 57 (L) 12/17/2022 1523   GFRNONAA 49 (L) 09/30/2019 1008   GFRAA 59 (L) 03/31/2020 0848   GFRAA 57 (L) 09/30/2019 1008     Diabetic Labs (most recent): Lab Results  Component Value Date   HGBA1C 12.3 (A) 06/04/2023   HGBA1C 8.8 (A) 10/31/2022   HGBA1C 11.0 (H) 06/21/2022   MICROALBUR 150 07/14/2020   MICROALBUR 8.5 09/23/2018   MICROALBUR 127.6 (H) 06/20/2018     Lipid Panel ( most recent) Lipid Panel     Component Value Date/Time   CHOL 70 (L) 08/24/2022 0937   TRIG 43 08/24/2022 0937   HDL 39 (L) 08/24/2022 0937   CHOLHDL 1.8 08/24/2022 0937   CHOLHDL 2.6 09/30/2019 1008   VLDL 7 11/27/2016 1000   LDLCALC 19 08/24/2022 0937   LDLCALC 58 09/30/2019 1008   LABVLDL 12 08/24/2022 0937      Lab Results  Component Value Date   TSH 0.668 02/09/2023   TSH 2.350 08/24/2022   TSH 0.905 03/29/2021   TSH 1.300 03/31/2020   TSH 1.77 09/23/2018   TSH 0.61 07/19/2015   TSH 0.952 03/18/2015   TSH 0.921 12/11/2013   TSH 1.392 10/29/2012   TSH 0.615 09/26/2011   FREET4 1.26 03/31/2020      Assessment & Plan:   1) Uncontrolled type 2 diabetes mellitus with hyperglycemia (HCC)  - Kage ZIV WELCHEL has currently uncontrolled symptomatic type 2 DM since  83 years of age.  He presents today, accompanied by his daughter, with his CGM showing gross hyperglycemia overall.  His POCT A1c today is 12.3%, increasing drastically from last visit of 7.6%.  He just got back to his daughters house from his spouses house.  Daughter notes he was allowed to have more sweetened items while away.   Analysis of his CGM shows TIR 0%, TAR 100%, TBR 0% with time active only 25%.     Recent labs reviewed.  - I had a long discussion with him about the progressive nature of diabetes and the pathology behind its complications. -his diabetes is complicated by peripheral arterial disease and he remains at a high risk for more acute and chronic complications which include CAD, CVA, CKD, retinopathy,  and neuropathy. These are all discussed in detail with him.  - Nutritional counseling repeated at each appointment due to patients tendency to fall back in to old habits.  - The patient admits there is a room for improvement in their diet and drink choices. -  Suggestion is made for the patient to avoid simple carbohydrates from their diet including Cakes, Sweet Desserts / Pastries, Ice Cream, Soda (diet and regular), Sweet Tea, Candies, Chips, Cookies, Sweet Pastries, Store Bought Juices, Alcohol in Excess of 1-2 drinks a day, Artificial Sweeteners, Coffee Creamer, and "Sugar-free" Products. This will help patient to have stable blood glucose profile and potentially avoid unintended weight gain.   - I encouraged the patient to switch to unprocessed or minimally processed complex starch and increased protein intake (animal or plant source), fruits, and vegetables.   - Patient is advised to stick to a routine mealtimes to eat 3 meals a day and avoid unnecessary snacks (to snack only to correct hypoglycemia).  - I have approached him with the following individualized plan to manage  his diabetes and patient agrees:   -In light of his advanced age and light body weight, avoiding hypoglycemia should be the number 1 priority in his care.   -He is  advised to increase his Tresiba to 15 units SQ nightly and continue Januvia 100 mg po daily.  We discussed the need to get back on track with low sugar diet.  -He is encouraged to continue monitoring blood glucose at least twice daily (using his CGM), before breakfast and before bed, and to call the clinic if he has readings less than 70 or above 300 for 3 tests in a row.    - he is not a candidate for Metformin due to concurrent renal insufficiency.  - he is not a candidate for incretin therapy due to his body habitus.   - Specific targets for  A1c;  LDL, HDL,  and Triglycerides were discussed with the patient.  2) Blood Pressure /Hypertension:   his  blood pressure is controlled to target for his age.   he is advised to continue his current medications including Benazepril 10 mg p.o. daily with breakfast .  3) Lipids/Hyperlipidemia:    His most recent lipid panel from 08/24/22 shows controlled LDL at 19.   He is advised to continue Crestor 5 mg po daily at bedtime.  Side effects and precautions discussed with him.  4)  Weight/Diet:  His Body mass index is 17.11 kg/m.   he is not a candidate for weight loss.   Exercise, and detailed carbohydrates information provided  -  detailed on discharge instructions.  5) Osteopenia: -He will not need bisphosphonate intervention at this time.  He is advised to continue on his Vitamin D 4000 units daily, as well as Calcium carbonate 600 mg p.o. daily.  His PCP started him on Ergocalciferol 50000 units weekly.  6) Chronic Care/Health Maintenance: -he is on ACEI/ARB and Statin medications and is encouraged to initiate and continue to follow up with Ophthalmology, Dentist, Podiatrist at least yearly or according to recommendations, and advised to stay away from smoking. I have recommended yearly flu vaccine and pneumonia vaccine at least every 5 years; moderate intensity exercise for up to 150 minutes weekly; and  sleep for at least 7 hours a day.  - he is advised to maintain close follow up with Kerri Perches, MD for primary care needs, as well as his other providers for optimal and coordinated care.    I spent  40  minutes in the care of the patient today including review of labs from CMP, Lipids, Thyroid Function, Hematology (current and previous including abstractions from other facilities); face-to-face time discussing  his blood glucose readings/logs, discussing hypoglycemia and hyperglycemia episodes and symptoms, medications doses, his options of short and long term treatment based on the latest standards of care / guidelines;  discussion about incorporating lifestyle medicine;  and documenting  the encounter. Risk reduction counseling performed per USPSTF guidelines to reduce obesity and cardiovascular risk factors.     Please refer to Patient Instructions for Blood Glucose Monitoring and Insulin/Medications Dosing Guide"  in media tab for additional information. Please  also refer to " Patient Self Inventory" in the Media  tab for reviewed elements of pertinent patient history.  Alda Lea participated in the discussions, expressed understanding, and voiced agreement with the above plans.  All questions were answered to his satisfaction. he is encouraged to contact clinic should he have any questions or concerns prior to his return visit.   Follow up plan: - Return in about 3 months (around 09/01/2023) for Diabetes F/U with A1c in office, No previsit labs, Bring meter and logs.  Ronny Bacon, FNP-BC [facility]  [address] [place], [name] [address] Phone: [phone] Fax: [phone]  06/04/2023, 5:07 PM

## 2023-06-07 ENCOUNTER — Ambulatory Visit: Payer: Medicare HMO | Admitting: Nutrition

## 2023-06-12 ENCOUNTER — Ambulatory Visit: Payer: Medicare HMO | Admitting: Nutrition

## 2023-06-16 DIAGNOSIS — H04123 Dry eye syndrome of bilateral lacrimal glands: Secondary | ICD-10-CM | POA: Insufficient documentation

## 2023-06-16 DIAGNOSIS — N200 Calculus of kidney: Secondary | ICD-10-CM | POA: Insufficient documentation

## 2023-06-16 NOTE — Progress Notes (Deleted)
 Name: Jacob Barnes DOB: 04-16-1940 MRN: [id number]  History of Present Illness: Jacob Barnes is a 83 y.o. male who presents today for follow up visit at [facility] [place]. He is accompanied by his daughter Jacob Barnes, who assists with providing history due to patient's dementia.  - GU History: 1. Kidney stones. - 01/02/2023: Underwent left ESWL procedure by Dr. Ronne Binning for management of a left proximal stone.   At last visit on 01/26/2023: - Reported gross hematuria; otherwise asymptomatic. - UA: RBC TNTC  - Urine culture negative. - Patient & daughter were quite concerned about possible UTI due to the gross hematuria. Agreed to send urine for culture and to treat empirically with Bactrim. - Advised the following: 1. Urine culture (negative) 2. Bactrim 2x/day x5 days. 3. CBC & BMP. 4. Return in about 2 weeks (around 02/09/2023) for UA, PVR, & f/u with Evette Georges NP  Since last visit: > 02/14/2023 CT stone study: 1. Redemonstrated 0.8 cm stone at the level of the left renal pelvis without associated upstream left-sided pelvicaliectasis. 2. Punctate (0.3 cm) nonobstructing right-sided renal stones. No evidence of right-sided urinary obstruction. 3. Diffuse thickening of the urinary bladder wall, potentially accentuated by underdistention though could be seen in the setting of cystitis. Correlation with urinalysis is advised.  > 02/09/2023:  - PSA normal (1.3) - UA: 2+ blood; negative for nitrites or leukocytes. - Negative urine culture.  > 03/02/2023:  - Spoke with patient's daughter Jacob Barnes via phone; advised ureteroscopic stone manipulation to address 8 mm distal left ureteral stone (still present per the CT done on 02/14/2023).  - "Jacob Barnes stated that Jacob Barnes' wife (Jacob Barnes's stepmother) does not want him to have that done but that Jacob Barnes makes his own medical decisions. Patient has a documented history of dementia; it is unclear from today's conversation whether he  has a medical POA and if so who that is (wife or daughter). Jacob Barnes stated she will discuss the surgical recommendation with her father this weekend and notify our office of whether or not he elects to proceed with the left ureteroscopic stone manipulation procedure. If so, will submit surgery request. If not, will recommend follow up visit in office with repeat imaging." Never received response.  > 03/15/2023: Nephrology appointment. GFR 51; creatinine 1.38.  > 06/04/2023: HgA1c = 12.3  Today: KUB today: Awaiting radiology read; *** appreciated per provider interpretation. ***RUS today: Awaiting radiology read; *** appreciated per provider interpretation.  He {Actions; denies-reports:120008} recent stone passage. He {Actions; denies-reports:120008} flank pain or abdominal pain. He {Actions; denies-reports:120008} fevers, nausea, or vomiting.  He {Actions; denies-reports:120008} increased urinary urgency, frequency, nocturia, dysuria, gross hematuria, hesitancy, straining to void, or sensations of incomplete emptying.  ***if KUB negative, probably needs CT hematuria due to persistent hematuria & smoking hx  Medications: Current Outpatient Medications  Medication Sig Dispense Refill   amLODipine (NORVASC) 2.5 MG tablet TAKE 1 TABLET BY MOUTH EVERY DAY 90 tablet 1   atorvastatin (LIPITOR) 10 MG tablet GIVE 1 TABLET BY MOUTH AT BEDTIME EVERY MONDAY/WEDNESDAY/FRIDAY FOR HIGH CHOLESTEROL 40 tablet 2   blood glucose meter kit and supplies Dispense based on patient and insurance preference. Use up to two times daily as directed. (FOR ICD-10 E10.9, E11.9). 1 each 0   Continuous Glucose Sensor (FREESTYLE LIBRE 2 SENSOR) MISC Change sensor every 14 days 6 each 3   donepezil (ARICEPT) 10 MG tablet Take 1 tablet (10 mg total) by mouth at bedtime. 90 tablet 3   dorzolamide-timolol (COSOPT)  2-0.5 % ophthalmic solution 1 drop 2 (two) times daily.     insulin degludec (TRESIBA FLEXTOUCH) 100 UNIT/ML  FlexTouch Pen Inject 14 Units into the skin at bedtime. 12 mL 3   Insulin Pen Needle (PEN NEEDLES) 31G X 6 MM MISC 1 each by Does not apply route at bedtime. 100 each 2   latanoprost (XALATAN) 0.005 % ophthalmic solution 1 drop at bedtime.     levETIRAcetam (KEPPRA) 500 MG tablet TAKE 1 TABLET BY MOUTH TWICE A DAY 180 tablet 1   sitaGLIPtin (JANUVIA) 100 MG tablet Take 1 tablet (100 mg total) by mouth daily. 90 tablet 1   tamsulosin (FLOMAX) 0.4 MG CAPS capsule TAKE 1 CAPSULE BY MOUTH EVERY DAY AFTER SUPPER 90 capsule 1   Vitamin D, Ergocalciferol, (DRISDOL) 1.25 MG (50000 UNIT) CAPS capsule Take 1 capsule (50,000 Units total) by mouth every 7 (seven) days. 5 capsule 8   No current facility-administered medications for this visit.    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   Qualifier: Diagnosis of  By: Lodema Hong MD, Margaret     GERD (gastroesophageal reflux disease)    Glaucoma    both eyes   History of kidney stones    Hyperlipemia    Hypertension    Iron deficiency anemia 01/10/2014   Other pancytopenia (HCC) 01/10/2014   New in 01/2014, undergoing pathology review    Rash    front abdomen no drainage   Past Surgical History:  Procedure Laterality Date   AMPUTATION Right 09/01/2016   Procedure: AMPUTATION RIGHT MIDDLE FINGER TIP;  Surgeon: Kathryne Hitch, MD;  Location: [facility];  Service: Orthopedics;  Laterality: Right;   AMPUTATION Right 10/10/2016   Procedure: REPEAT IRRIGATION AND DEBRIDEMENT  RIGHT MIDDLE FINGER WITH AMPUTATION THROUGH PROXIMAL PHALANX;  Surgeon: Kathryne Hitch, MD;  Location: [facility];  Service: Orthopedics;  Laterality: Right;   APPENDECTOMY     BIOPSY  01/13/2022   Procedure: BIOPSY;  Surgeon: Dolores Frame, MD;  Location: [facility];  Service: Gastroenterology;;   COLONOSCOPY  09/06/2011   Procedure: COLONOSCOPY;  Surgeon: Malissa Hippo, MD;  Location: [facility];  Service: Endoscopy;  Laterality: N/A;  830   COLONOSCOPY WITH PROPOFOL N/A 01/13/2022   Procedure: COLONOSCOPY WITH PROPOFOL;  Surgeon: Dolores Frame, MD;  Location: [facility];  Service: Gastroenterology;  Laterality: N/A;  200 ASA 3   CYSTOSCOPY/RETROGRADE/URETEROSCOPY/STONE EXTRACTION WITH BASKET     ESOPHAGOGASTRODUODENOSCOPY N/A 01/15/2014   Procedure: ESOPHAGOGASTRODUODENOSCOPY (EGD);  Surgeon: Malissa Hippo, MD;  Location: [facility];  Service: Endoscopy;  Laterality: N/A;  200   ESOPHAGOGASTRODUODENOSCOPY N/A 04/22/2014   Procedure: ESOPHAGOGASTRODUODENOSCOPY (EGD);  Surgeon: Malissa Hippo, MD;  Location: [facility];  Service: Endoscopy;  Laterality: N/A;  240   EXTRACORPOREAL SHOCK WAVE LITHOTRIPSY Left 01/02/2023   Procedure: EXTRACORPOREAL SHOCK WAVE LITHOTRIPSY (ESWL);  Surgeon: Malen Gauze,  MD;  Location: [facility];  Service: Urology;  Laterality: Left;   EYE SURGERY Bilateral yrs ago   ioc for cataract   IR ANGIO EXTERNAL CAROTID SEL EXT CAROTID UNI L MOD SED  03/22/2022   IR ANGIO INTRA EXTRACRAN SEL INTERNAL CAROTID UNI L MOD SED  03/26/2022   IR ANGIOGRAM FOLLOW UP STUDY  03/26/2022   IR NEURO EACH ADD'L AFTER BASIC UNI LEFT (MS)  03/26/2022   IR TRANSCATH/EMBOLIZ  03/26/2022   MALONEY DILATION N/A 01/15/2014   Procedure: Elease Hashimoto DILATION;  Surgeon: Malissa Hippo, MD;  Location: [facility];  Service: Endoscopy;  Laterality: N/A;   MALONEY DILATION  04/22/2014   Procedure: Elease Hashimoto DILATION;  Surgeon: Malissa Hippo, MD;   Location: [facility];  Service: Endoscopy;;   POLYPECTOMY  01/13/2022   Procedure: POLYPECTOMY;  Surgeon: Marguerita Merles, Reuel Boom, MD;  Location: [facility];  Service: Gastroenterology;;   RADIOLOGY WITH ANESTHESIA Left 03/22/2022   Procedure: LEFT MMA EMBOLIZATION;  Surgeon: Lisbeth Renshaw, MD;  [facility];  Service: Radiology;  Laterality: Left;   surgery for left eye bleedf  2017   Family History  Problem Relation Age of Onset   Diabetes Mother    Hypertension Mother    Hyperlipidemia Mother    Stroke Mother    Heart failure Mother    Diabetes Father    Heart disease Father    Hypertension Father    Hyperlipidemia Father    Heart attack Father    Diabetes Brother    Diabetes Brother    Diabetes Son    Social History   Socioeconomic History   Marital status: Married    Spouse name: Not on file   Number of children: Not on file   Years of education: Not on file   Highest education level: Not on file  Occupational History   Not on file  Tobacco Use   Smoking status: Never    Passive exposure: Never   Smokeless tobacco: Never  Vaping Use   Vaping status: Never Used  Substance and Sexual Activity   Alcohol use: No   Drug use: No   Sexual activity: Yes  Other Topics Concern   Not on file  Social History Narrative   Not on file   Social Drivers of Health   Financial Resource Strain: Low Risk  (07/13/2022)   Overall Financial Resource Strain (CARDIA)    Difficulty of Paying Living Expenses: Not hard at all  Food Insecurity: No Food Insecurity (07/13/2022)   Hunger Vital Sign    Worried About Running Out of Food in the Last Year: Never true    Ran Out of Food in the Last Year: Never true  Transportation Needs: No Transportation Needs (07/13/2022)   PRAPARE - Administrator, Civil Service (Medical): No    Lack of Transportation (Non-Medical): No  Physical Activity: Sufficiently Active (07/13/2022)   Exercise Vital Sign    Days of Exercise per  Week: 4 days    Minutes of Exercise per Session: 60 min  Stress: No Stress Concern Present (07/13/2022)   [employer] of Occupational Health - Occupational Stress Questionnaire    Feeling of Stress : Not at all  Social Connections: Socially Integrated (07/13/2022)   Social Connection and Isolation Panel [NHANES]    Frequency of Communication with Friends and Family: More than three times a week    Frequency of Social Gatherings with Friends and Family: Once a week  Attends Religious Services: More than 4 times per year    Active Member of Clubs or Organizations: No    Attends Banker Meetings: 1 to 4 times per year    Marital Status: Married  Catering manager Violence: Not At Risk (07/13/2022)   Humiliation, Afraid, Rape, and Kick questionnaire    Fear of Current or Ex-Partner: No    Emotionally Abused: No    Physically Abused: No    Sexually Abused: No    SUBJECTIVE  Review of Systems Constitutional: Patient denies any unintentional weight loss or change in strength lntegumentary: Patient denies any rashes or pruritus Cardiovascular: Patient denies chest pain or syncope Respiratory: Patient denies shortness of breath Gastrointestinal: ***Patient {Actions; denies-reports:120008} ***nausea, ***vomiting, ***constipation, ***diarrhea ***As per HPI Musculoskeletal: Patient denies muscle cramps or weakness Neurologic: Patient denies convulsions or seizures Allergic/Immunologic: Patient denies recent allergic reaction(s) Hematologic/Lymphatic: Patient denies bleeding tendencies Endocrine: Patient denies heat/cold intolerance  GU: As per HPI.  OBJECTIVE There were no vitals filed for this visit. There is no height or weight on file to calculate BMI.  Physical Examination Constitutional: No obvious distress; patient is non-toxic appearing  Cardiovascular: No visible lower extremity edema.  Respiratory: The patient does not have audible wheezing/stridor;  respirations do not appear labored  Gastrointestinal: Abdomen non-distended Musculoskeletal: Normal ROM of UEs  Skin: No obvious rashes/open sores  Neurologic: CN 2-12 grossly intact Psychiatric: Answered questions appropriately with normal affect  Hematologic/Lymphatic/Immunologic: No obvious bruises or sites of spontaneous bleeding  UA:  ***positive for *** leukocytes, *** blood, ***nitrites ***Urine microscopy:  ***negative  *** WBC/hpf, *** RBC/hpf, *** bacteria ***with no evidence of UTI ***with no evidence of microscopic hematuria ***otherwise unremarkable ***glucosuria (secondary to ***Jardiance ***Farxiga use)  PVR: *** ml  ASSESSMENT No diagnosis found.  ***We reviewed recent imaging results; ***awaiting radiology results, appears to have ***no acute findings per provider interpretation.  ***For stone prevention: Advised adequate hydration and we discussed option to consider low oxalate diet given that calcium oxalate is the most common type of stone. Handout provided about stone prevention diet.  ***For recurrent stone formers: We discussed option to proceed with 24 hour urinalysis ([facility]) for metabolic stone evaluation, which may help with targeted recommendations for dietary I medication therapies for stone prevention. Patient elected to ***proceed/ ***hold off.  Will plan to follow up in ***6 months / ***1 year with ***KUB ***RUS for stone surveillance or sooner if needed.  Patient verbalized understanding of and agreement with current plan. All questions were answered.  PLAN Advised the following: Maintain adequate fluid intake daily. Drink citrus juice (lemon, lime or orange juice) routinely. Low oxalate diet. No follow-ups on file.  No orders of the defined types were placed in this encounter.   It has been explained that the patient is to follow regularly with their PCP in addition to all other providers involved in their care and to follow instructions  provided by these respective offices. Patient advised to contact urology clinic if any urologic-pertaining questions, concerns, new symptoms or problems arise in the interim period.  There are no Patient Instructions on file for this visit.  Electronically signed by:  Donnita Falls, MSN, FNP-C, CUNP 06/16/2023 7:34 PM

## 2023-06-19 ENCOUNTER — Ambulatory Visit: Payer: Medicare HMO | Admitting: Urology

## 2023-06-19 DIAGNOSIS — N200 Calculus of kidney: Secondary | ICD-10-CM

## 2023-07-13 ENCOUNTER — Encounter: Payer: Self-pay | Admitting: Family Medicine

## 2023-07-13 ENCOUNTER — Ambulatory Visit (INDEPENDENT_AMBULATORY_CARE_PROVIDER_SITE_OTHER): Payer: Medicare HMO | Admitting: Family Medicine

## 2023-07-13 VITALS — BP 121/72 | HR 74 | Resp 16 | Ht 65.0 in | Wt 100.1 lb

## 2023-07-13 DIAGNOSIS — D508 Other iron deficiency anemias: Secondary | ICD-10-CM

## 2023-07-13 DIAGNOSIS — F03B0 Unspecified dementia, moderate, without behavioral disturbance, psychotic disturbance, mood disturbance, and anxiety: Secondary | ICD-10-CM

## 2023-07-13 DIAGNOSIS — E118 Type 2 diabetes mellitus with unspecified complications: Secondary | ICD-10-CM

## 2023-07-13 DIAGNOSIS — E440 Moderate protein-calorie malnutrition: Secondary | ICD-10-CM

## 2023-07-13 DIAGNOSIS — E611 Iron deficiency: Secondary | ICD-10-CM

## 2023-07-13 DIAGNOSIS — I10 Essential (primary) hypertension: Secondary | ICD-10-CM

## 2023-07-13 DIAGNOSIS — Z9181 History of falling: Secondary | ICD-10-CM

## 2023-07-13 DIAGNOSIS — Z794 Long term (current) use of insulin: Secondary | ICD-10-CM

## 2023-07-13 DIAGNOSIS — D631 Anemia in chronic kidney disease: Secondary | ICD-10-CM

## 2023-07-13 DIAGNOSIS — E785 Hyperlipidemia, unspecified: Secondary | ICD-10-CM | POA: Diagnosis not present

## 2023-07-13 DIAGNOSIS — N1831 Chronic kidney disease, stage 3a: Secondary | ICD-10-CM

## 2023-07-13 DIAGNOSIS — E1159 Type 2 diabetes mellitus with other circulatory complications: Secondary | ICD-10-CM

## 2023-07-13 LAB — GLUCOSE, POCT (MANUAL RESULT ENTRY): POC Glucose: 174 mg/dL — AB (ref 70–99)

## 2023-07-13 NOTE — Assessment & Plan Note (Signed)
 Improved intake as well as blood sugar control will facilitate weight gain and improved health overall

## 2023-07-13 NOTE — Assessment & Plan Note (Addendum)
 Lab today re evaluate and manage as appropriate

## 2023-07-13 NOTE — Progress Notes (Signed)
 Jacob Barnes     MRN: [id number]      DOB: [birth year]  Chief Complaint  Patient presents with   Diabetes    Follow up     HPI Jacob Barnes is here for follow up and re-evaluation of chronic medical conditions, medication management and review of any available recent lab and radiology data.  Blood sugar is extremely uncontrolled, daughter who accompanies him states he was living away from her care for the past several months and  she is extremely upset about how out of control his blood sugar has become again Reports a good appetite, last ate over 8 hours ago  ROS Denies recent fever or chills. Denies sinus pressure, nasal congestion, ear pain or sore throat. Denies chest congestion, productive cough or wheezing. Denies chest pains, palpitations and leg swelling Denies abdominal pain, nausea, vomiting,diarrhea or constipation.   Denies dysuria, frequency, hesitancy or incontinence. C/o joint pain and limitation in mobility Denies depression, anxiety or insomnia. Denies skin break down or rash.   PE  BP 121/72   Pulse 74   Resp 16   Ht 5\' 5"  (1.651 m)   Wt 100 lb 1.9 oz (45.4 kg)   SpO2 96%   BMI 16.66 kg/m   Patient alert and oriented and in no cardiopulmonary distress.Underweight/malnourished  HEENT: No facial asymmetry, EOMI,     Neck supple .  Chest: Clear to auscultation bilaterally.  CVS: S1, S2 no murmurs, no S3.Regular rate.  ABD: Soft non tender.   Ext: No edema  MS: Adequate ROM spine, shoulders, hips and knees.  Skin: Intact, no ulcerations or rash noted.  Psych: Good eye contact, normal affect. Memory intact not anxious or depressed appearing.  CNS: CN 2-12 intact, power,  normal throughout.no focal deficits noted.   Assessment & Plan  Hyperlipidemia LDL goal <100 Hyperlipidemia:Low fat diet discussed and encouraged.   Lipid Panel  Lab Results  Component Value Date   CHOL 70 (L) 08/24/2022   HDL 39 (L) 08/24/2022   LDLCALC 19 08/24/2022    TRIG 43 08/24/2022   CHOLHDL 1.8 08/24/2022     Updated lab needed at/ before next visit.   Anemia Lab today re evaluate and manage as appropriate  At high risk for falls Unsteady gait, poor vision home safety and need to use assistive device is stressed  Diabetes mellitus with complication, with long-term current use of insulin (HCC) Diabetes associated with hypertension, hyperlipidemia, CKD, and arthritis  Jacob Barnes is reminded of the importance of commitment to daily physical activity for 30 minutes or more, as able and the need to limit carbohydrate intake to 30 to 60 grams per meal to help with blood sugar control.   The need to take medication as prescribed, test blood sugar as directed, and to call between visits if there is a concern that blood sugar is uncontrolled is also discussed.   Jacob Barnes is reminded of the importance of daily foot exam, annual eye examination, and good blood sugar, blood pressure and cholesterol control.     Latest Ref Rng & Units 06/04/2023    3:47 PM 02/09/2023   10:10 AM 01/26/2023    2:15 PM 12/17/2022    3:23 PM 10/31/2022    3:50 PM  Diabetic Labs  HbA1c 4.0 - 5.6 % 12.3     8.8   Micro/Creat Ratio 0 - 29 mg/g creat  117      Creatinine 0.76 - 1.27 mg/dL  4.09  8.11  1.25        07/13/2023    4:18 PM 06/04/2023    3:26 PM 03/14/2023    9:37 AM 03/14/2023    8:54 AM 02/09/2023    8:55 AM 02/01/2023    3:46 PM 01/02/2023    9:18 AM  BP/Weight  Systolic BP 121 102 120 121 103 110 116  Diastolic BP 72 62 70 70 62 70 65  Wt. (Lbs) 100.12 102.8  105 101.12 105.2   BMI 16.66 kg/m2 17.11 kg/m2  17.47 kg/m2 16.83 kg/m2 17.51 kg/m2       Latest Ref Rng & Units 01/26/2022    2:00 PM 01/17/2022   12:00 AM  Foot/eye exam completion dates  Eye Exam No Retinopathy  Retinopathy      Foot Form Completion  Done      This result is from an external source.      Worsened control/ uncontrolled, managed by Endo, needs to folow diet  and med  management more closely, family members are responsible for his care as he is incapable of independently managing his diabetes and health conditions. He is unable to live independently  Malnutrition of moderate degree (HCC) Improved intake as well as blood sugar control will facilitate weight gain and improved health overall  Essential hypertension, benign Controlled, no change in medication   Dementia (HCC) Continue aricept at current dose

## 2023-07-13 NOTE — Assessment & Plan Note (Signed)
 Controlled, no change in medication

## 2023-07-13 NOTE — Patient Instructions (Signed)
 F/U in 4 months, call if you need me sooner  Labs today  Need to get blood sugar controlled  Thanks for choosing [facility], we consider it a privelige to serve you.

## 2023-07-13 NOTE — Assessment & Plan Note (Signed)
Continue aricept at current dose

## 2023-07-13 NOTE — Assessment & Plan Note (Signed)
 Unsteady gait, poor vision home safety and need to use assistive device is stressed

## 2023-07-13 NOTE — Assessment & Plan Note (Signed)
 Hyperlipidemia:Low fat diet discussed and encouraged.   Lipid Panel  Lab Results  Component Value Date   CHOL 70 (L) 08/24/2022   HDL 39 (L) 08/24/2022   LDLCALC 19 08/24/2022   TRIG 43 08/24/2022   CHOLHDL 1.8 08/24/2022     Updated lab needed at/ before next visit.

## 2023-07-13 NOTE — Assessment & Plan Note (Signed)
 Diabetes associated with hypertension, hyperlipidemia, CKD, and arthritis  Jacob Barnes is reminded of the importance of commitment to daily physical activity for 30 minutes or more, as able and the need to limit carbohydrate intake to 30 to 60 grams per meal to help with blood sugar control.   The need to take medication as prescribed, test blood sugar as directed, and to call between visits if there is a concern that blood sugar is uncontrolled is also discussed.   Jacob Barnes is reminded of the importance of daily foot exam, annual eye examination, and good blood sugar, blood pressure and cholesterol control.     Latest Ref Rng & Units 06/04/2023    3:47 PM 02/09/2023   10:10 AM 01/26/2023    2:15 PM 12/17/2022    3:23 PM 10/31/2022    3:50 PM  Diabetic Labs  HbA1c 4.0 - 5.6 % 12.3     8.8   Micro/Creat Ratio 0 - 29 mg/g creat  117      Creatinine 0.76 - 1.27 mg/dL  1.61  0.96  0.45        07/13/2023    4:18 PM 06/04/2023    3:26 PM 03/14/2023    9:37 AM 03/14/2023    8:54 AM 02/09/2023    8:55 AM 02/01/2023    3:46 PM 01/02/2023    9:18 AM  BP/Weight  Systolic BP 121 102 120 121 103 110 116  Diastolic BP 72 62 70 70 62 70 65  Wt. (Lbs) 100.12 102.8  105 101.12 105.2   BMI 16.66 kg/m2 17.11 kg/m2  17.47 kg/m2 16.83 kg/m2 17.51 kg/m2       Latest Ref Rng & Units 01/26/2022    2:00 PM 01/17/2022   12:00 AM  Foot/eye exam completion dates  Eye Exam No Retinopathy  Retinopathy      Foot Form Completion  Done      This result is from an external source.      Worsened control/ uncontrolled, managed by Endo, needs to folow diet  and med management more closely, family members are responsible for his care as he is incapable of independently managing his diabetes and health conditions. He is unable to live independently

## 2023-07-14 LAB — IRON,TIBC AND FERRITIN PANEL
Ferritin: 157 ng/mL (ref 30–400)
Iron Saturation: 5 % — CL (ref 15–55)
Iron: 11 ug/dL — ABNORMAL LOW (ref 38–169)
Total Iron Binding Capacity: 207 ug/dL — ABNORMAL LOW (ref 250–450)
UIBC: 196 ug/dL (ref 111–343)

## 2023-07-14 LAB — LIPID PANEL W/O CHOL/HDL RATIO
Cholesterol, Total: 67 mg/dL — ABNORMAL LOW (ref 100–199)
HDL: 44 mg/dL (ref >39)
LDL Chol Calc (NIH): 11 mg/dL (ref 0–99)
Triglycerides: 38 mg/dL (ref 0–149)
VLDL Cholesterol Cal: 12 mg/dL (ref 5–40)

## 2023-07-14 LAB — CMP14+EGFR
ALT: 14 IU/L (ref 0–44)
AST: 19 IU/L (ref 0–40)
Albumin: 3.8 g/dL (ref 3.7–4.7)
Alkaline Phosphatase: 77 IU/L (ref 44–121)
BUN/Creatinine Ratio: 13 (ref 10–24)
BUN: 16 mg/dL (ref 8–27)
Bilirubin Total: 0.4 mg/dL (ref 0.0–1.2)
CO2: 25 mmol/L (ref 20–29)
Calcium: 8.7 mg/dL (ref 8.6–10.2)
Chloride: 101 mmol/L (ref 96–106)
Creatinine, Ser: 1.25 mg/dL (ref 0.76–1.27)
Globulin, Total: 4 g/dL (ref 1.5–4.5)
Glucose: 190 mg/dL — ABNORMAL HIGH (ref 70–99)
Potassium: 4.6 mmol/L (ref 3.5–5.2)
Sodium: 136 mmol/L (ref 134–144)
Total Protein: 7.8 g/dL (ref 6.0–8.5)
eGFR: 57 mL/min/{1.73_m2} — ABNORMAL LOW (ref >59)

## 2023-07-14 LAB — CBC
Hematocrit: 28.6 % — ABNORMAL LOW (ref 37.5–51.0)
Hemoglobin: 9.3 g/dL — ABNORMAL LOW (ref 13.0–17.7)
MCH: 30.7 pg (ref 26.6–33.0)
MCHC: 32.5 g/dL (ref 31.5–35.7)
MCV: 94 fL (ref 79–97)
Platelets: 127 10*3/uL — ABNORMAL LOW (ref 150–450)
RBC: 3.03 x10E6/uL — ABNORMAL LOW (ref 4.14–5.80)
RDW: 14.5 % (ref 11.6–15.4)
WBC: 10.2 10*3/uL (ref 3.4–10.8)

## 2023-07-14 NOTE — Addendum Note (Signed)
 Addended by: Kerri Perches on: 07/14/2023 05:35 PM   Modules accepted: Orders

## 2023-07-16 ENCOUNTER — Ambulatory Visit: Admitting: Nutrition

## 2023-07-17 ENCOUNTER — Ambulatory Visit: Payer: Medicare HMO

## 2023-07-17 ENCOUNTER — Ambulatory Visit: Admitting: Urology

## 2023-07-17 ENCOUNTER — Other Ambulatory Visit: Payer: Self-pay

## 2023-07-17 DIAGNOSIS — N201 Calculus of ureter: Secondary | ICD-10-CM

## 2023-07-18 ENCOUNTER — Inpatient Hospital Stay: Attending: Oncology | Admitting: Oncology

## 2023-07-18 ENCOUNTER — Inpatient Hospital Stay

## 2023-07-18 ENCOUNTER — Encounter: Payer: Self-pay | Admitting: Oncology

## 2023-07-18 VITALS — BP 125/70 | HR 61 | Temp 96.1°F | Resp 18 | Ht 64.17 in | Wt 101.0 lb

## 2023-07-18 DIAGNOSIS — D509 Iron deficiency anemia, unspecified: Secondary | ICD-10-CM | POA: Insufficient documentation

## 2023-07-18 DIAGNOSIS — R5383 Other fatigue: Secondary | ICD-10-CM | POA: Insufficient documentation

## 2023-07-18 DIAGNOSIS — D500 Iron deficiency anemia secondary to blood loss (chronic): Secondary | ICD-10-CM

## 2023-07-18 NOTE — Progress Notes (Signed)
 [facility]  HEMATOLOGY NEW VISIT  Kerri Perches, MD  REASON FOR REFERRAL: Iron deficiency anemia  HISTORY OF PRESENT ILLNESS: Jacob Barnes 83 y.o. male referred for iron deficiency anemia. The patient is accompanied by his daughter today.  Patient has a past medical history of hypertension, diabetes, dementia, chronic kidney disease.  Patient reports fatigue daughter reports that patient sleeps a lot.  He has no other complaints.  He denies shortness of breath, dyspnea on exertion, nausea, vomiting, melena, hematochezia, loss of appetite or loss of weight.  Patient has been doing well overall.  He is a non-smoker, does not drink alcohol.  Lives alone.  He has no family history of colon cancer.  I have reviewed the past medical history, past surgical history, social history and family history with the patient   ALLERGIES:  is allergic to alpha-gal.  MEDICATIONS:  Current Outpatient Medications  Medication Sig Dispense Refill   amLODipine (NORVASC) 2.5 MG tablet TAKE 1 TABLET BY MOUTH EVERY DAY 90 tablet 1   atorvastatin (LIPITOR) 10 MG tablet GIVE 1 TABLET BY MOUTH AT BEDTIME EVERY MONDAY/WEDNESDAY/FRIDAY FOR HIGH CHOLESTEROL 40 tablet 2   blood glucose meter kit and supplies Dispense based on patient and insurance preference. Use up to two times daily as directed. (FOR ICD-10 E10.9, E11.9). 1 each 0   CALCIUM-VITAMIN D PO Take by mouth.     Continuous Glucose Sensor (FREESTYLE LIBRE 2 SENSOR) MISC Change sensor every 14 days 6 each 3   donepezil (ARICEPT) 10 MG tablet Take 1 tablet (10 mg total) by mouth at bedtime. 90 tablet 3   dorzolamide-timolol (COSOPT) 2-0.5 % ophthalmic solution 1 drop 2 (two) times daily.     ferrous sulfate 325 (65 FE) MG tablet Take 325 mg by mouth every other day.     insulin degludec (TRESIBA FLEXTOUCH) 100 UNIT/ML FlexTouch Pen Inject 14 Units into the skin at bedtime. 12 mL 3   Insulin Pen Needle (PEN NEEDLES)  31G X 6 MM MISC 1 each by Does not apply route at bedtime. 100 each 2   latanoprost (XALATAN) 0.005 % ophthalmic solution 1 drop at bedtime.     levETIRAcetam (KEPPRA) 500 MG tablet TAKE 1 TABLET BY MOUTH TWICE A DAY 180 tablet 1   Potassium 99 MG TABS Take 99 mg by mouth daily.     sitaGLIPtin (JANUVIA) 100 MG tablet Take 1 tablet (100 mg total) by mouth daily. 90 tablet 1   tamsulosin (FLOMAX) 0.4 MG CAPS capsule TAKE 1 CAPSULE BY MOUTH EVERY DAY AFTER SUPPER 90 capsule 1   Vitamin D, Ergocalciferol, (DRISDOL) 1.25 MG (50000 UNIT) CAPS capsule Take 1 capsule (50,000 Units total) by mouth every 7 (seven) days. 5 capsule 8   No current facility-administered medications for this visit.     REVIEW OF SYSTEMS:   Constitutional: Denies fevers, chills or night sweats Eyes: Denies blurriness of vision Ears, nose, mouth, throat, and face: Denies mucositis or sore throat Respiratory: Denies cough, dyspnea or wheezes Cardiovascular: Denies palpitation, chest discomfort or lower extremity swelling Gastrointestinal:  Denies nausea, heartburn or change in bowel habits Skin: Denies abnormal skin rashes Lymphatics: Denies new lymphadenopathy or easy bruising Neurological:Denies numbness, tingling or new weaknesses Behavioral/Psych: Mood is stable, no new changes  All other systems were reviewed with the patient and are negative.  PHYSICAL EXAMINATION:   Vitals:   07/18/23 0815  BP: 125/70  Pulse: 61  Resp: 18  Temp: (!)  96.1 F (35.6 C)  SpO2: 99%    GENERAL:alert, no distress and comfortable LUNGS: clear to auscultation and percussion with normal breathing effort HEART: regular rate & rhythm and no murmurs and no lower extremity edema ABDOMEN:abdomen soft, non-tender and normal bowel sounds Musculoskeletal:no cyanosis of digits and no clubbing  NEURO: alert & oriented x 3 with fluent speech  LABORATORY DATA:  I have reviewed the data as listed  Lab Results  Component Value Date    WBC 10.2 07/13/2023   NEUTROABS 5.9 06/21/2022   HGB 9.3 (L) 07/13/2023   HCT 28.6 (L) 07/13/2023   MCV 94 07/13/2023   PLT 127 (L) 07/13/2023       Chemistry      Component Value Date/Time   NA 136 07/13/2023 1645   K 4.6 07/13/2023 1645   CL 101 07/13/2023 1645   CO2 25 07/13/2023 1645   BUN 16 07/13/2023 1645   CREATININE 1.25 07/13/2023 1645   CREATININE 1.36 (H) 09/30/2019 1008      Component Value Date/Time   CALCIUM 8.7 07/13/2023 1645   ALKPHOS 77 07/13/2023 1645   AST 19 07/13/2023 1645   ALT 14 07/13/2023 1645   BILITOT 0.4 07/13/2023 1645      Latest Reference Range & Units 07/13/23 16:45  Iron 38 - 169 ug/dL 11 (L)  UIBC 161 - 096 ug/dL 045  TIBC 409 - 811 ug/dL 914 (L)  Ferritin 30 - 400 ng/mL 157  Iron Saturation 15 - 55 % 5 (LL)  (LL): Data is critically low (L): Data is abnormally low  Colonoscopy:01/13/22: Impression:  - Three 3 to 6 mm polyps in the rectum, in the descending colon and in the transverse colon, removed with a cold snare. Resected and retrieved.  - The rest of the examined colon is normal. Biopsied.  - The distal rectum and anal verge are normal on retroflexion view.  Pathology: 01/13/22:  FINAL MICROSCOPIC DIAGNOSIS:   A. COLON, RANDOM, BIOPSY:  - Colonic mucosa with no specific histopathologic changes  - Negative for acute inflammation, increased intraepithelial lymphocytes  or thickened subepithelial collagen table   B. COLON, TRANSVERSE, POLYPECTOMY:  - Tubular adenoma  - Negative for high-grade dysplasia or malignancy   C. COLON, DESCENDING, RECTUM, POLYPECTOMY:  - Tubular adenoma(s)  - Negative for high-grade dysplasia or malignancy    RADIOGRAPHIC STUDIES: I have personally reviewed the radiological images as listed and agreed with the findings in the report.  CT RENAL STONE STUDY CLINICAL DATA:  Symptomatic left-sided ureteral stone  EXAM: CT ABDOMEN AND PELVIS WITHOUT CONTRAST  TECHNIQUE: Multidetector CT  imaging of the abdomen and pelvis was performed following the standard protocol without IV contrast.  RADIATION DOSE REDUCTION: This exam was performed according to the departmental dose-optimization program which includes automated exposure control, adjustment of the mA and/or kV according to patient size and/or use of iterative reconstruction technique.  COMPARISON:  12/17/2022; 05/03/2021  FINDINGS: The lack of intravenous contrast limits the ability to evaluate solid abdominal organs.  Lower chest: Limited visualization of the lower thorax demonstrates improved aeration of the left lung base with minimal residual ill-defined ground-glass opacities at location of previously noted consolidative opacity. Minimal bibasilar reticular opacities without associated honeycombing. Note is again made of a 1.9 x 1.0 cm bolus/cyst within the medial basilar aspect of the right lower lobe. No discrete focal airspace opacities. No pleural effusion.  Normal heart size. There is diffuse decreased attenuation of the intracardiac blood pool suggestive of  anemia. Coronary artery calcifications versus the sequela of previous coronary artery stenting.  Hepatobiliary: Normal hepatic contour. Normal noncontrast appearance of the gallbladder given underdistention. No radiopaque gallstones. No ascites.  Pancreas: Normal noncontrast appearance of the pancreas.  Spleen: Normal noncontrast appearance of the spleen.  Adrenals/Urinary Tract: There is an approximate 0.8 cm stone at the level of the left renal pelvis (image 23, series 2) without associated upstream left-sided pelvicaliectasis. Note is made of a punctate (0.3 cm) nonobstructing right-sided renal stones (images 26 and 30, series 2). There is increased attenuation of several of the bilateral renal calices, potentially representative additional tiny renal stones versus nephrocalcinosis. No renal stones are seen along expected course of  either ureter or the urinary bladder. There is diffuse thickening of the urinary bladder wall, potentially accentuated by underdistention. No urinary obstruction or perinephric stranding.  Normal noncontrast appearance of the bilateral adrenal glands.  Stomach/Bowel: Moderate colonic stool burden without evidence of enteric obstruction. No pneumoperitoneum, pneumatosis or portal venous gas. Evaluation of the intestines is degraded secondary to lack of IV or enteric contrast as well as lack of any significant intra abdominal fat.  Vascular/Lymphatic: Atherosclerotic plaque within a tortuous but normal caliber abdominal aorta.  No definitive bulky retroperitoneal, mesenteric, pelvic or inguinal lymphadenopathy, though evaluation degraded secondary to lack of significant intra-abdominal fat.  Reproductive: Dystrophic calcifications within a borderline enlarged prostate gland. No free fluid the pelvic cul-de-sac.  Other: Mild diffuse body wall anasarca.  Musculoskeletal: Old right iliac wing fracture with residual deformity. Mild degenerative change of the bilateral hips with joint space loss, subchondral sclerosis and osteophytosis.  IMPRESSION: 1. Redemonstrated 0.8 cm stone at the level of the left renal pelvis without associated upstream left-sided pelvicaliectasis. 2. Punctate (0.3 cm) nonobstructing right-sided renal stones. No evidence of right-sided urinary obstruction. 3. Diffuse thickening of the urinary bladder wall, potentially accentuated by underdistention though could be seen in the setting of cystitis. Correlation with urinalysis is advised. 4. Improved aeration of the left lung base with minimal residual ill-defined ground-glass opacities at location of previously noted consolidative opacity. 5. Diffuse decreased attenuation of the intracardiac blood pool suggestive of anemia. 6. Mild diffuse body wall anasarca. 7. Aortic Atherosclerosis  (ICD10-I70.0).  Electronically Signed   By: Simonne Come M.D.   On: 02/14/2023 13:31 DG Chest 2 View CLINICAL DATA:  Nipple markers to rule out pulmonary nodule.  EXAM: CHEST - 2 VIEW  COMPARISON:  02/09/2023; CT abdomen pelvis-12/17/2022  FINDINGS: Normal cardiac silhouette and mediastinal contours with tortuosity of the thoracic aorta. Minimal bibasilar reticular opacities. No discrete focal airspace opacities. No pleural effusion or pneumothorax. No evidence of edema.  Repeat examination with nipple markers confirmed previously questioned nodular opacities overlying the lower lungs are favored to represent nipple shadows. No discrete pulmonary nodules.  Redemonstrated severe (greater 75%) compression deformity involving the superior endplate of L1, similar to the 12/17/2022 examination. Degenerative change of the right glenohumeral joint is suspected though incompletely evaluated.  IMPRESSION: 1. Repeat examination with nipple markers confirmed previously questioned nodular opacities overlying the lower lungs are favored to represent nipple shadows. No discrete pulmonary nodules. 2. Similar findings of bibasilar atelectasis/scarring without superimposed acute cardiopulmonary disease. 3. Redemonstrated severe (greater 75%) compression deformity involving the superior endplate of L1, similar to the 12/17/2022 examination.  Electronically Signed   By: Simonne Come M.D.   On: 02/14/2023 12:57   ASSESSMENT & PLAN:  Patient is a 83 year old male referred for iron deficiency anemia   Iron deficiency  anemia The most likely cause of his anemia is due to chronic blood loss.  Previous colonoscopy showed some tubular adenoma, polyps.  Patient is symptomatic with fatigue but denies melena, hematochezia.  - We discussed some of the risks, benefits, and alternatives of intravenous iron infusions. The patient is symptomatic from anemia and the iron level is critically low. He  desires to achieve higher levels of iron faster for adequate hematopoesis. Some of the side-effects to be expected including risks of infusion reactions, phlebitis, headaches, nausea and fatigue.  The patient is willing to proceed. Patient education material was dispensed. Goal is to keep ferritin level greater than 50 and resolution of anemia - Start oral iron every other day.  Use MiraLAX for constipation. - Will reach out to Dr. Levon Hedger to see if they can see him sooner in the clinic and do further workup for iron deficiency anemia  Return to clinic in 6 weeks with labs to assess for response to IV iron treatment   Orders Placed This Encounter  Procedures   Ferritin    Standing Status:   Future    Expected Date:   08/27/2023    Expiration Date:   07/17/2024   Folate    Standing Status:   Future    Expected Date:   08/27/2023    Expiration Date:   07/17/2024   Vitamin B12    Standing Status:   Future    Expected Date:   08/27/2023    Expiration Date:   07/17/2024   CBC with Differential/Platelet    Standing Status:   Future    Expected Date:   08/27/2023    Expiration Date:   07/17/2024   Comprehensive metabolic panel with GFR    Standing Status:   Future    Expected Date:   08/27/2023    Expiration Date:   07/17/2024   Multiple Myeloma Panel (SPEP&IFE w/QIG)    Standing Status:   Future    Expected Date:   08/27/2023    Expiration Date:   07/17/2024   Kappa/lambda light chains    Standing Status:   Future    Expected Date:   08/27/2023    Expiration Date:   07/17/2024   Iron and TIBC    Standing Status:   Future    Expected Date:   08/27/2023    Expiration Date:   07/17/2024    The total time spent in the appointment was 40 minutes encounter with patients including review of chart and various tests results, discussions about plan of care and coordination of care plan   All questions were answered. The patient knows to call the clinic with any problems, questions or concerns. No barriers to  learning was detected.   Cindie Crumbly, MD 4/9/202512:48 PM

## 2023-07-18 NOTE — Assessment & Plan Note (Signed)
 The most likely cause of his anemia is due to chronic blood loss.  Previous colonoscopy showed some tubular adenoma, polyps.  Patient is symptomatic with fatigue but denies melena, hematochezia.  - We discussed some of the risks, benefits, and alternatives of intravenous iron infusions. The patient is symptomatic from anemia and the iron level is critically low. He desires to achieve higher levels of iron faster for adequate hematopoesis. Some of the side-effects to be expected including risks of infusion reactions, phlebitis, headaches, nausea and fatigue.  The patient is willing to proceed. Patient education material was dispensed. Goal is to keep ferritin level greater than 50 and resolution of anemia - Start oral iron every other day.  Use MiraLAX for constipation. - Will reach out to Dr. Levon Hedger to see if they can see him sooner in the clinic and do further workup for iron deficiency anemia  Return to clinic in 6 weeks with labs to assess for response to IV iron treatment

## 2023-07-18 NOTE — Patient Instructions (Signed)
 You were seen today for iron deficiency anemia  IRON DEFICIENCY ANEMIA: Iron deficiency anemia can be caused by chronic blood loss.  Will give you IV iron in 2 doses.  Please take your premedications before coming for iron infusions.  Also start taking iron pills every other day.  If you have constipation, take MiraLAX.  Will recheck labs around 6 weeks mark to assess for response.  We will reach out to Dr. Levon Hedger to see if he can see you sooner than June   If you have any questions please reach out to the clinic

## 2023-07-27 ENCOUNTER — Inpatient Hospital Stay

## 2023-07-27 VITALS — BP 106/60 | HR 61 | Temp 97.4°F | Resp 18

## 2023-07-27 DIAGNOSIS — D509 Iron deficiency anemia, unspecified: Secondary | ICD-10-CM

## 2023-07-27 MED ORDER — CETIRIZINE HCL 10 MG/ML IV SOLN
5.0000 mg | Freq: Once | INTRAVENOUS | Status: AC
Start: 2023-07-27 — End: 2023-07-27
  Administered 2023-07-27: 5 mg via INTRAVENOUS
  Filled 2023-07-27: qty 1

## 2023-07-27 MED ORDER — ACETAMINOPHEN 325 MG PO TABS
650.0000 mg | ORAL_TABLET | Freq: Once | ORAL | Status: AC
  Administered 2023-07-27: 650 mg via ORAL
  Filled 2023-07-27: qty 2

## 2023-07-27 MED ORDER — SODIUM CHLORIDE 0.9 % IV SOLN: INTRAVENOUS | Status: DC

## 2023-07-27 MED ORDER — IRON SUCROSE 500 MG IVPB - SIMPLE MED
500.0000 mg | Freq: Once | INTRAVENOUS | Status: AC
  Administered 2023-07-27: 500 mg via INTRAVENOUS
  Filled 2023-07-27: qty 500

## 2023-07-27 NOTE — Progress Notes (Signed)
 Venofer  500 mg given today per MD orders. Tolerated infusion without adverse affects. Vital signs stable. No complaints at this time. Discharged from clinic via wheelchair in stable condition. Alert and oriented x 3. F/U with [facility] as scheduled.

## 2023-07-27 NOTE — Patient Instructions (Signed)
 [facility] [place] - A DEPT OF [facility]  Discharge Instructions: Thank you for choosing [facility] to provide your oncology and hematology care.  If you have a lab appointment with the Cancer Center - please note that after April 8th, 2024, all labs will be drawn in the cancer center.  You do not have to check in or register with the main entrance as you have in the past but will complete your check-in in the cancer center.  Wear comfortable clothing and clothing appropriate for easy access to any Portacath or PICC line.   We strive to give you quality time with your provider. You may need to reschedule your appointment if you arrive late (15 or more minutes).  Arriving late affects you and other patients whose appointments are after yours.  Also, if you miss three or more appointments without notifying the office, you may be dismissed from the clinic at the provider's discretion.      For prescription refill requests, have your pharmacy contact our office and allow 72 hours for refills to be completed.    Today you received Venofer IV iron infusion.     BELOW ARE SYMPTOMS THAT SHOULD BE REPORTED IMMEDIATELY: *FEVER GREATER THAN 100.4 F (38 C) OR HIGHER *CHILLS OR SWEATING *NAUSEA AND VOMITING THAT IS NOT CONTROLLED WITH YOUR NAUSEA MEDICATION *UNUSUAL SHORTNESS OF BREATH *UNUSUAL BRUISING OR BLEEDING *URINARY PROBLEMS (pain or burning when urinating, or frequent urination) *BOWEL PROBLEMS (unusual diarrhea, constipation, pain near the anus) TENDERNESS IN MOUTH AND THROAT WITH OR WITHOUT PRESENCE OF ULCERS (sore throat, sores in mouth, or a toothache) UNUSUAL RASH, SWELLING OR PAIN  UNUSUAL VAGINAL DISCHARGE OR ITCHING   Items with * indicate a potential emergency and should be followed up as soon as possible or go to the Emergency Department if any problems should occur.  Please show the CHEMOTHERAPY ALERT CARD or IMMUNOTHERAPY ALERT CARD at  check-in to the Emergency Department and triage nurse.  Should you have questions after your visit or need to cancel or reschedule your appointment, please contact [facility]  - A DEPT OF [facility] [phone]  and follow the prompts.  Office hours are 8:00 a.m. to 4:30 p.m. Monday - Friday. Please note that voicemails left after 4:00 p.m. may not be returned until the following business day.  We are closed weekends and major holidays. You have access to a nurse at all times for urgent questions. Please call the main number to the clinic [phone] and follow the prompts.  For any non-urgent questions, you may also contact your provider using MyChart. We now offer e-Visits for anyone 13 and older to request care online for non-urgent symptoms. For details visit [email or web address].   Also download the MyChart app! Go to the app store, search "MyChart", open the app, select [place], and log in with your MyChart username and password.

## 2023-07-27 NOTE — Progress Notes (Signed)
Patient presents today for iron infusion.  Patient is in satisfactory condition with no new complaints voiced.  Vital signs are stable.  We will proceed with infusion per provider orders. 

## 2023-07-29 ENCOUNTER — Other Ambulatory Visit: Payer: Self-pay | Admitting: Urology

## 2023-07-30 ENCOUNTER — Ambulatory Visit (INDEPENDENT_AMBULATORY_CARE_PROVIDER_SITE_OTHER): Admitting: Gastroenterology

## 2023-08-03 ENCOUNTER — Inpatient Hospital Stay

## 2023-08-03 VITALS — BP 113/66 | HR 63 | Temp 97.8°F | Resp 18

## 2023-08-03 DIAGNOSIS — D509 Iron deficiency anemia, unspecified: Secondary | ICD-10-CM

## 2023-08-03 MED ORDER — CETIRIZINE HCL 10 MG/ML IV SOLN
5.0000 mg | Freq: Once | INTRAVENOUS | Status: AC
  Administered 2023-08-03: 5 mg via INTRAVENOUS
  Filled 2023-08-03: qty 1

## 2023-08-03 MED ORDER — SODIUM CHLORIDE 0.9 % IV SOLN: INTRAVENOUS | Status: DC

## 2023-08-03 MED ORDER — IRON SUCROSE 500 MG IVPB - SIMPLE MED
500.0000 mg | Freq: Once | INTRAVENOUS | Status: AC
Start: 2023-08-03 — End: 2023-08-03
  Administered 2023-08-03: 500 mg via INTRAVENOUS
  Filled 2023-08-03: qty 400

## 2023-08-03 MED ORDER — ACETAMINOPHEN 325 MG PO TABS
650.0000 mg | ORAL_TABLET | Freq: Once | ORAL | Status: AC
Start: 2023-08-03 — End: 2023-08-03
  Administered 2023-08-03: 650 mg via ORAL
  Filled 2023-08-03: qty 2

## 2023-08-03 NOTE — Patient Instructions (Signed)
 [facility] [place] [facility]  Discharge Instructions: Thank you for choosing [facility] to provide your oncology and hematology care.  If you have a lab appointment with the Cancer Center - please note that after April 8th, 2024, all labs will be drawn in the cancer center.  You do not have to check in or register with the main entrance as you have in the past but will complete your check-in in the cancer center.  Wear comfortable clothing and clothing appropriate for easy access to any Portacath or PICC line.   We strive to give you quality time with your provider. You may need to reschedule your appointment if you arrive late (15 or more minutes).  Arriving late affects you and other patients whose appointments are after yours.  Also, if you miss three or more appointments without notifying the office, you may be dismissed from the clinic at the provider's discretion.      For prescription refill requests, have your pharmacy contact our office and allow 72 hours for refills to be completed.    Today you received the following Venofer, return as scheduled.   To help prevent nausea and vomiting after your treatment, we encourage you to take your nausea medication as directed.  BELOW ARE SYMPTOMS THAT SHOULD BE REPORTED IMMEDIATELY: *FEVER GREATER THAN 100.4 F (38 C) OR HIGHER *CHILLS OR SWEATING *NAUSEA AND VOMITING THAT IS NOT CONTROLLED WITH YOUR NAUSEA MEDICATION *UNUSUAL SHORTNESS OF BREATH *UNUSUAL BRUISING OR BLEEDING *URINARY PROBLEMS (pain or burning when urinating, or frequent urination) *BOWEL PROBLEMS (unusual diarrhea, constipation, pain near the anus) TENDERNESS IN MOUTH AND THROAT WITH OR WITHOUT PRESENCE OF ULCERS (sore throat, sores in mouth, or a toothache) UNUSUAL RASH, SWELLING OR PAIN  UNUSUAL VAGINAL DISCHARGE OR ITCHING   Items with * indicate a potential emergency and should be followed up as soon as possible or  go to the Emergency Department if any problems should occur.  Please show the CHEMOTHERAPY ALERT CARD or IMMUNOTHERAPY ALERT CARD at check-in to the Emergency Department and triage nurse.  Should you have questions after your visit or need to cancel or reschedule your appointment, please contact [facility] [place] - A DEPT OF [facility] [phone]  and follow the prompts.  Office hours are 8:00 a.m. to 4:30 p.m. Monday - Friday. Please note that voicemails left after 4:00 p.m. may not be returned until the following business day.  We are closed weekends and major holidays. You have access to a nurse at all times for urgent questions. Please call the main number to the clinic [phone] and follow the prompts.  For any non-urgent questions, you may also contact your provider using MyChart. We now offer e-Visits for anyone 81 and older to request care online for non-urgent symptoms. For details visit [email or web address].   Also download the MyChart app! Go to the app store, search "MyChart", open the app, select [place], and log in with your MyChart username and password.

## 2023-08-03 NOTE — Progress Notes (Signed)
 Patient tolerated iron infusion with no complaints voiced.  Peripheral IV site clean and dry with good blood return noted before and after infusion.  Band aid applied.  VSS with discharge and left in satisfactory condition with no s/s of distress noted.

## 2023-08-08 ENCOUNTER — Ambulatory Visit: Admitting: Nutrition

## 2023-08-31 ENCOUNTER — Inpatient Hospital Stay: Attending: Hematology

## 2023-08-31 DIAGNOSIS — D509 Iron deficiency anemia, unspecified: Secondary | ICD-10-CM | POA: Diagnosis present

## 2023-08-31 DIAGNOSIS — R768 Other specified abnormal immunological findings in serum: Secondary | ICD-10-CM | POA: Insufficient documentation

## 2023-08-31 DIAGNOSIS — D696 Thrombocytopenia, unspecified: Secondary | ICD-10-CM | POA: Diagnosis not present

## 2023-08-31 DIAGNOSIS — D500 Iron deficiency anemia secondary to blood loss (chronic): Secondary | ICD-10-CM

## 2023-08-31 LAB — CBC WITH DIFFERENTIAL/PLATELET
Abs Immature Granulocytes: 0 10*3/uL (ref 0.00–0.07)
Basophils Absolute: 0 10*3/uL (ref 0.0–0.1)
Basophils Relative: 0 %
Eosinophils Absolute: 0 10*3/uL (ref 0.0–0.5)
Eosinophils Relative: 0 %
HCT: 33.5 % — ABNORMAL LOW (ref 39.0–52.0)
Hemoglobin: 10.8 g/dL — ABNORMAL LOW (ref 13.0–17.0)
Lymphocytes Relative: 15 %
Lymphs Abs: 1.5 10*3/uL (ref 0.7–4.0)
MCH: 31.2 pg (ref 26.0–34.0)
MCHC: 32.2 g/dL (ref 30.0–36.0)
MCV: 96.8 fL (ref 80.0–100.0)
Monocytes Absolute: 2.4 10*3/uL — ABNORMAL HIGH (ref 0.1–1.0)
Monocytes Relative: 25 %
Neutro Abs: 5.8 10*3/uL (ref 1.7–7.7)
Neutrophils Relative %: 60 %
Platelets: 103 10*3/uL — ABNORMAL LOW (ref 150–400)
RBC: 3.46 MIL/uL — ABNORMAL LOW (ref 4.22–5.81)
RDW: 16.9 % — ABNORMAL HIGH (ref 11.5–15.5)
WBC: 9.7 10*3/uL (ref 4.0–10.5)
nRBC: 0 % (ref 0.0–0.2)

## 2023-08-31 LAB — COMPREHENSIVE METABOLIC PANEL WITH GFR
ALT: 17 U/L (ref 0–44)
AST: 19 U/L (ref 15–41)
Albumin: 3.4 g/dL — ABNORMAL LOW (ref 3.5–5.0)
Alkaline Phosphatase: 69 U/L (ref 38–126)
Anion gap: 5 (ref 5–15)
BUN: 16 mg/dL (ref 8–23)
CO2: 26 mmol/L (ref 22–32)
Calcium: 8.7 mg/dL — ABNORMAL LOW (ref 8.9–10.3)
Chloride: 104 mmol/L (ref 98–111)
Creatinine, Ser: 1.19 mg/dL (ref 0.61–1.24)
GFR, Estimated: >60 mL/min (ref >60)
Glucose, Bld: 178 mg/dL — ABNORMAL HIGH (ref 70–99)
Potassium: 3.5 mmol/L (ref 3.5–5.1)
Sodium: 135 mmol/L (ref 135–145)
Total Bilirubin: 0.4 mg/dL (ref 0.0–1.2)
Total Protein: 8.5 g/dL — ABNORMAL HIGH (ref 6.5–8.1)

## 2023-08-31 LAB — FERRITIN: Ferritin: 302 ng/mL (ref 24–336)

## 2023-08-31 LAB — VITAMIN B12: Vitamin B-12: 932 pg/mL — ABNORMAL HIGH (ref 180–914)

## 2023-08-31 LAB — IRON AND TIBC
Iron: 51 ug/dL (ref 45–182)
Saturation Ratios: 23 % (ref 17.9–39.5)
TIBC: 224 ug/dL — ABNORMAL LOW (ref 250–450)
UIBC: 173 ug/dL

## 2023-08-31 LAB — FOLATE: Folate: 12.1 ng/mL (ref >5.9)

## 2023-09-03 LAB — MULTIPLE MYELOMA PANEL, SERUM
Albumin SerPl Elph-Mcnc: 3.3 g/dL (ref 2.9–4.4)
Albumin/Glob SerPl: 0.8 (ref 0.7–1.7)
Alpha 1: 0.2 g/dL (ref 0.0–0.4)
Alpha2 Glob SerPl Elph-Mcnc: 0.6 g/dL (ref 0.4–1.0)
B-Globulin SerPl Elph-Mcnc: 1.1 g/dL (ref 0.7–1.3)
Gamma Glob SerPl Elph-Mcnc: 2.6 g/dL — ABNORMAL HIGH (ref 0.4–1.8)
Globulin, Total: 4.6 g/dL — ABNORMAL HIGH (ref 2.2–3.9)
IgA: 780 mg/dL — ABNORMAL HIGH (ref 61–437)
IgG (Immunoglobin G), Serum: 2719 mg/dL — ABNORMAL HIGH (ref 603–1613)
IgM (Immunoglobulin M), Srm: 65 mg/dL (ref 15–143)
Total Protein ELP: 7.9 g/dL (ref 6.0–8.5)

## 2023-09-04 LAB — KAPPA/LAMBDA LIGHT CHAINS
Kappa free light chain: 67.5 mg/L — ABNORMAL HIGH (ref 3.3–19.4)
Kappa, lambda light chain ratio: 1.25 (ref 0.26–1.65)
Lambda free light chains: 53.8 mg/L — ABNORMAL HIGH (ref 5.7–26.3)

## 2023-09-05 ENCOUNTER — Ambulatory Visit: Payer: Medicare HMO | Admitting: Nurse Practitioner

## 2023-09-06 ENCOUNTER — Ambulatory Visit (INDEPENDENT_AMBULATORY_CARE_PROVIDER_SITE_OTHER): Admitting: Gastroenterology

## 2023-09-07 ENCOUNTER — Inpatient Hospital Stay: Admitting: Oncology

## 2023-09-07 VITALS — BP 118/78 | HR 62 | Temp 97.8°F | Resp 16 | Wt 98.3 lb

## 2023-09-07 DIAGNOSIS — D649 Anemia, unspecified: Secondary | ICD-10-CM | POA: Diagnosis not present

## 2023-09-07 DIAGNOSIS — D696 Thrombocytopenia, unspecified: Secondary | ICD-10-CM | POA: Diagnosis not present

## 2023-09-07 DIAGNOSIS — D500 Iron deficiency anemia secondary to blood loss (chronic): Secondary | ICD-10-CM | POA: Diagnosis not present

## 2023-09-07 DIAGNOSIS — R768 Other specified abnormal immunological findings in serum: Secondary | ICD-10-CM | POA: Diagnosis not present

## 2023-09-07 DIAGNOSIS — D509 Iron deficiency anemia, unspecified: Secondary | ICD-10-CM | POA: Diagnosis not present

## 2023-09-07 NOTE — Progress Notes (Signed)
 [facility] at [facility]  HEMATOLOGY FOLLOW-UP VISIT  Towanda Fret, MD  REASON FOR FOLLOW-UP: Normocytic anemia  ASSESSMENT & PLAN:  Patient is a 83 y.o. male following for normocytic anemia  Iron  deficiency anemia The most likely cause of his anemia is due to chronic blood loss.   Previous colonoscopy showed some tubular adenoma, polyps.  Patient is scheduled to see GI in August Iron  levels and hemoglobin improved with IV iron .  But still anemic.  - Continue oral iron  every other day.  Use MiraLAX for constipation - Continue to follow-up with GI  Return to clinic in 3 months with labs   Normocytic anemia Patient has normocytic anemia that is not resolved with the iron  supplementation Multiple myeloma workup: SPEP: No M spike, IFE: Polyclonal increase Slightly elevated free light chains with normal ratio Elevated IgG and IgA  - Will repeat labs in 3 months - Will consider/discuss bone marrow biopsy if anemia persists  Thrombocytopenia (HCC) Patient has chronic thrombocytopenia since from at least 2014 No hepatosplenomegaly on the CT scan from November 2024 No nutritional deficiency at this time Does not report any increased bleeding or bruises  - Will obtain hepatitis panel, copper level - Continue to follow counts  Elevated serum immunoglobulin free light chains Patient has elevated free light chains with normal ratio Elevated IgG and IgA Differential includes autoimmune, chronic inflammation/infection  - Will repeat labs in 3 months - Will obtain autoimmune workup at that time   Orders Placed This Encounter  Procedures   Ferritin    Standing Status:   Future    Expected Date:   12/03/2023    Expiration Date:   09/06/2024   Folate    Standing Status:   Future    Expected Date:   12/03/2023    Expiration Date:   09/06/2024   Vitamin B12    Standing Status:   Future    Expected Date:   12/03/2023    Expiration Date:   09/06/2024    CBC with Differential/Platelet    Standing Status:   Future    Expected Date:   12/03/2023    Expiration Date:   09/06/2024   Comprehensive metabolic panel with GFR    Standing Status:   Future    Expected Date:   12/03/2023    Expiration Date:   09/06/2024   Kappa/lambda light chains    Standing Status:   Future    Expected Date:   12/03/2023    Expiration Date:   09/06/2024   Multiple Myeloma Panel (SPEP&IFE w/QIG)    Standing Status:   Future    Expected Date:   12/03/2023    Expiration Date:   09/06/2024   Iron  and TIBC    Standing Status:   Future    Expected Date:   12/03/2023    Expiration Date:   09/06/2024   ANA, IFA (with reflex)    Standing Status:   Future    Expected Date:   12/03/2023    Expiration Date:   09/06/2024   Rheumatoid factor    Standing Status:   Future    Expected Date:   12/03/2023    Expiration Date:   09/06/2024   CYCLIC CITRUL PEPTIDE ANTIBODY, IGG/IGA    Standing Status:   Future    Expected Date:   12/03/2023    Expiration Date:   09/06/2024   Copper, serum    Standing Status:   Future  Expected Date:   12/03/2023    Expiration Date:   09/06/2024   Hepatitis panel, acute    Standing Status:   Future    Expected Date:   12/03/2023    Expiration Date:   09/06/2024    Release to patient:   Immediate [1]    The total time spent in the appointment was 20 minutes encounter with patients including review of chart and various tests results, discussions about plan of care and coordination of care plan   All questions were answered. The patient knows to call the clinic with any problems, questions or concerns. No barriers to learning was detected.  Eduardo Grade, MD 5/30/20259:07 AM   SUMMARY OF HEMATOLOGIC HISTORY: Normocytic anemia: Likely secondary to iron  deficiency and a component of anemia of chronic inflammation - S/p IV iron  Venofer  500 mg X 2 doses on 07/27/2023 and 08/02/2023   INTERVAL HISTORY: PARV MANTHEY 83 y.o. male following for  normocytic anemia. Patient was accompanied by his daughter today.  He reports no complaints today and stated that he is doing very well.  Patient spends most of his time in his bed and is in a wheelchair today.  He continues to take iron  pills every other day with no complications.   I have reviewed the past medical history, past surgical history, social history and family history with the patient   ALLERGIES:  is allergic to alpha-gal.  MEDICATIONS:  Current Outpatient Medications  Medication Sig Dispense Refill   amLODipine  (NORVASC ) 2.5 MG tablet TAKE 1 TABLET BY MOUTH EVERY DAY 90 tablet 1   atorvastatin (LIPITOR) 10 MG tablet GIVE 1 TABLET BY MOUTH AT BEDTIME EVERY MONDAY/WEDNESDAY/FRIDAY FOR HIGH CHOLESTEROL 40 tablet 2   blood glucose meter kit and supplies Dispense based on patient and insurance preference. Use up to two times daily as directed. (FOR ICD-10 E10.9, E11.9). 1 each 0   CALCIUM -VITAMIN D  PO Take by mouth.     Continuous Glucose Sensor (FREESTYLE LIBRE 2 SENSOR) MISC Change sensor every 14 days 6 each 3   donepezil  (ARICEPT ) 10 MG tablet Take 1 tablet (10 mg total) by mouth at bedtime. 90 tablet 3   dorzolamide -timolol  (COSOPT ) 2-0.5 % ophthalmic solution 1 drop 2 (two) times daily.     ferrous sulfate  325 (65 FE) MG tablet Take 325 mg by mouth every other day.     insulin  degludec (TRESIBA  FLEXTOUCH) 100 UNIT/ML FlexTouch Pen Inject 14 Units into the skin at bedtime. 12 mL 3   Insulin  Pen Needle (PEN NEEDLES) 31G X 6 MM MISC 1 each by Does not apply route at bedtime. 100 each 2   latanoprost  (XALATAN ) 0.005 % ophthalmic solution 1 drop at bedtime.     levETIRAcetam  (KEPPRA ) 500 MG tablet TAKE 1 TABLET BY MOUTH TWICE A DAY 180 tablet 1   Potassium 99 MG TABS Take 99 mg by mouth daily.     sitaGLIPtin  (JANUVIA ) 100 MG tablet Take 1 tablet (100 mg total) by mouth daily. 90 tablet 1   tamsulosin  (FLOMAX ) 0.4 MG CAPS capsule TAKE 1 CAPSULE BY MOUTH EVERY DAY AFTER SUPPER 90  capsule 1   Vitamin D , Ergocalciferol , (DRISDOL ) 1.25 MG (50000 UNIT) CAPS capsule Take 1 capsule (50,000 Units total) by mouth every 7 (seven) days. 5 capsule 8   No current facility-administered medications for this visit.     REVIEW OF SYSTEMS:   Constitutional: Denies fevers, chills or night sweats Eyes: Denies blurriness of vision Ears, nose, mouth, throat,  and face: Denies mucositis or sore throat Respiratory: Denies cough, dyspnea or wheezes Cardiovascular: Denies palpitation, chest discomfort or lower extremity swelling Gastrointestinal:  Denies nausea, heartburn or change in bowel habits Skin: Denies abnormal skin rashes Lymphatics: Denies new lymphadenopathy or easy bruising Neurological:Denies numbness, tingling or new weaknesses Behavioral/Psych: Mood is stable, no new changes  All other systems were reviewed with the patient and are negative.  PHYSICAL EXAMINATION:   Vitals:   09/07/23 0807  BP: 118/78  Pulse: 62  Resp: 16  Temp: 97.8 F (36.6 C)  SpO2: 100%    GENERAL:alert, no distress and comfortable SKIN: skin color, texture, turgor are normal, no rashes or significant lesions LUNGS: clear to auscultation and percussion with normal breathing effort HEART: regular rate & rhythm and no murmurs and no lower extremity edema ABDOMEN:abdomen soft, non-tender and normal bowel sounds Musculoskeletal:no cyanosis of digits and no clubbing  NEURO: alert & oriented x 3 with fluent speech  LABORATORY DATA:  I have reviewed the data as listed  Lab Results  Component Value Date   WBC 9.7 08/31/2023   NEUTROABS 5.8 08/31/2023   HGB 10.8 (L) 08/31/2023   HCT 33.5 (L) 08/31/2023   MCV 96.8 08/31/2023   PLT 103 (L) 08/31/2023        Chemistry      Component Value Date/Time   NA 135 08/31/2023 0847   NA 136 07/13/2023 1645   K 3.5 08/31/2023 0847   CL 104 08/31/2023 0847   CO2 26 08/31/2023 0847   BUN 16 08/31/2023 0847   BUN 16 07/13/2023 1645    CREATININE 1.19 08/31/2023 0847   CREATININE 1.36 (H) 09/30/2019 1008      Component Value Date/Time   CALCIUM  8.7 (L) 08/31/2023 0847   ALKPHOS 69 08/31/2023 0847   AST 19 08/31/2023 0847   ALT 17 08/31/2023 0847   BILITOT 0.4 08/31/2023 0847   BILITOT 0.4 07/13/2023 1645      Latest Reference Range & Units 08/31/23 08:46 08/31/23 08:47 08/31/23 08:48  Iron  45 - 182 ug/dL 51    UIBC ug/dL 409    TIBC 811 - 914 ug/dL 782 (L)    Saturation Ratios 17.9 - 39.5 % 23    Ferritin 24 - 336 ng/mL 302    Folate >5.9 ng/mL   12.1  Vitamin B12 180 - 914 pg/mL 932 (H)    Total Protein ELP 6.0 - 8.5 g/dL  7.9 (C)   Albumin SerPl Elph-Mcnc 2.9 - 4.4 g/dL  3.3 (C)   Albumin/Glob SerPl 0.7 - 1.7   0.8 (C)   Alpha2 Glob SerPl Elph-Mcnc 0.4 - 1.0 g/dL  0.6 (C)   Alpha 1 0.0 - 0.4 g/dL  0.2 (C)   Gamma Glob SerPl Elph-Mcnc 0.4 - 1.8 g/dL  2.6 (H) (C)   M Protein SerPl Elph-Mcnc Not Observed g/dL  Not Observed (C)   IFE 1   Comment ! (C)   Globulin, Total 2.2 - 3.9 g/dL  4.6 (H) (C)   B-Globulin SerPl Elph-Mcnc 0.7 - 1.3 g/dL  1.1 (C)   IgG (Immunoglobin G), Serum 603 - 1,613 mg/dL  9,562 (H)   IgM (Immunoglobulin M), Srm 15 - 143 mg/dL  65   IgA 61 - 130 mg/dL  865 (H)   (L): Data is abnormally low (H): Data is abnormally high !: Data is abnormal (C): Corrected   Latest Reference Range & Units 08/31/23 08:47  Kappa free light chain 3.3 - 19.4 mg/L 67.5 (  H)  Lambda free light chains 5.7 - 26.3 mg/L 53.8 (H)  Kappa, lambda light chain ratio 0.26 - 1.65  1.25  (H): Data is abnormally high   RADIOGRAPHIC STUDIES: I have personally reviewed the radiological images as listed and agreed with the findings in the report.  CT RENAL STONE STUDY CLINICAL DATA:  Symptomatic left-sided ureteral stone  EXAM: CT ABDOMEN AND PELVIS WITHOUT CONTRAST  TECHNIQUE: Multidetector CT imaging of the abdomen and pelvis was performed following the standard protocol without IV contrast.  RADIATION DOSE  REDUCTION: This exam was performed according to the departmental dose-optimization program which includes automated exposure control, adjustment of the mA and/or kV according to patient size and/or use of iterative reconstruction technique.  COMPARISON:  12/17/2022; 05/03/2021  FINDINGS: The lack of intravenous contrast limits the ability to evaluate solid abdominal organs.  Lower chest: Limited visualization of the lower thorax demonstrates improved aeration of the left lung base with minimal residual ill-defined ground-glass opacities at location of previously noted consolidative opacity. Minimal bibasilar reticular opacities without associated honeycombing. Note is again made of a 1.9 x 1.0 cm bolus/cyst within the medial basilar aspect of the right lower lobe. No discrete focal airspace opacities. No pleural effusion.  Normal heart size. There is diffuse decreased attenuation of the intracardiac blood pool suggestive of anemia. Coronary artery calcifications versus the sequela of previous coronary artery stenting.  Hepatobiliary: Normal hepatic contour. Normal noncontrast appearance of the gallbladder given underdistention. No radiopaque gallstones. No ascites.  Pancreas: Normal noncontrast appearance of the pancreas.  Spleen: Normal noncontrast appearance of the spleen.  Adrenals/Urinary Tract: There is an approximate 0.8 cm stone at the level of the left renal pelvis (image 23, series 2) without associated upstream left-sided pelvicaliectasis. Note is made of a punctate (0.3 cm) nonobstructing right-sided renal stones (images 26 and 30, series 2). There is increased attenuation of several of the bilateral renal calices, potentially representative additional tiny renal stones versus nephrocalcinosis. No renal stones are seen along expected course of either ureter or the urinary bladder. There is diffuse thickening of the urinary bladder wall, potentially accentuated by  underdistention. No urinary obstruction or perinephric stranding.  Normal noncontrast appearance of the bilateral adrenal glands.  Stomach/Bowel: Moderate colonic stool burden without evidence of enteric obstruction. No pneumoperitoneum, pneumatosis or portal venous gas. Evaluation of the intestines is degraded secondary to lack of IV or enteric contrast as well as lack of any significant intra abdominal fat.  Vascular/Lymphatic: Atherosclerotic plaque within a tortuous but normal caliber abdominal aorta.  No definitive bulky retroperitoneal, mesenteric, pelvic or inguinal lymphadenopathy, though evaluation degraded secondary to lack of significant intra-abdominal fat.  Reproductive: Dystrophic calcifications within a borderline enlarged prostate gland. No free fluid the pelvic cul-de-sac.  Other: Mild diffuse body wall anasarca.  Musculoskeletal: Old right iliac wing fracture with residual deformity. Mild degenerative change of the bilateral hips with joint space loss, subchondral sclerosis and osteophytosis.  IMPRESSION: 1. Redemonstrated 0.8 cm stone at the level of the left renal pelvis without associated upstream left-sided pelvicaliectasis. 2. Punctate (0.3 cm) nonobstructing right-sided renal stones. No evidence of right-sided urinary obstruction. 3. Diffuse thickening of the urinary bladder wall, potentially accentuated by underdistention though could be seen in the setting of cystitis. Correlation with urinalysis is advised. 4. Improved aeration of the left lung base with minimal residual ill-defined ground-glass opacities at location of previously noted consolidative opacity. 5. Diffuse decreased attenuation of the intracardiac blood pool suggestive of anemia. 6. Mild diffuse body  wall anasarca. 7. Aortic Atherosclerosis (ICD10-I70.0).  Electronically Signed   By: Robbi Childs M.D.   On: 02/14/2023 13:31 DG Chest 2 View CLINICAL DATA:  Nipple markers to rule  out pulmonary nodule.  EXAM: CHEST - 2 VIEW  COMPARISON:  02/09/2023; CT abdomen pelvis-12/17/2022  FINDINGS: Normal cardiac silhouette and mediastinal contours with tortuosity of the thoracic aorta. Minimal bibasilar reticular opacities. No discrete focal airspace opacities. No pleural effusion or pneumothorax. No evidence of edema.  Repeat examination with nipple markers confirmed previously questioned nodular opacities overlying the lower lungs are favored to represent nipple shadows. No discrete pulmonary nodules.  Redemonstrated severe (greater 75%) compression deformity involving the superior endplate of L1, similar to the 12/17/2022 examination. Degenerative change of the right glenohumeral joint is suspected though incompletely evaluated.  IMPRESSION: 1. Repeat examination with nipple markers confirmed previously questioned nodular opacities overlying the lower lungs are favored to represent nipple shadows. No discrete pulmonary nodules. 2. Similar findings of bibasilar atelectasis/scarring without superimposed acute cardiopulmonary disease. 3. Redemonstrated severe (greater 75%) compression deformity involving the superior endplate of L1, similar to the 12/17/2022 examination.  Electronically Signed   By: Robbi Childs M.D.   On: 02/14/2023 12:57

## 2023-09-07 NOTE — Assessment & Plan Note (Signed)
 Patient has normocytic anemia that is not resolved with the iron  supplementation Multiple myeloma workup: SPEP: No M spike, IFE: Polyclonal increase Slightly elevated free light chains with normal ratio Elevated IgG and IgA  - Will repeat labs in 3 months - Will consider/discuss bone marrow biopsy if anemia persists

## 2023-09-07 NOTE — Assessment & Plan Note (Signed)
 The most likely cause of his anemia is due to chronic blood loss.   Previous colonoscopy showed some tubular adenoma, polyps.  Patient is scheduled to see GI in August Iron  levels and hemoglobin improved with IV iron .  But still anemic.  - Continue oral iron  every other day.  Use MiraLAX for constipation - Continue to follow-up with GI  Return to clinic in 3 months with labs

## 2023-09-07 NOTE — Assessment & Plan Note (Signed)
 Patient has chronic thrombocytopenia since from at least 2014 No hepatosplenomegaly on the CT scan from November 2024 No nutritional deficiency at this time Does not report any increased bleeding or bruises  - Will obtain hepatitis panel, copper level - Continue to follow counts

## 2023-09-07 NOTE — Assessment & Plan Note (Signed)
 Patient has elevated free light chains with normal ratio Elevated IgG and IgA Differential includes autoimmune, chronic inflammation/infection  - Will repeat labs in 3 months - Will obtain autoimmune workup at that time

## 2023-09-10 NOTE — Progress Notes (Deleted)
 Recent renal labs: Lab Results  Component Value Date   WBC 9.7 08/31/2023   CREATININE 1.19 08/31/2023   EGFR 57 (L) 07/13/2023   GFRNONAA >60 08/31/2023    > Last GFR = >60 on 08/31/2023 > Last eGFR = 57 on 07/13/2023 > Last creatinine = 1.19 on 08/31/2023    > 08/31/2023: GFR >60, creatinine 1.19   Serum creatinine: 1.19 mg/dL 16/10/96 0454 Estimated creatinine clearance: 29.2 mL/min

## 2023-09-11 ENCOUNTER — Ambulatory Visit: Admitting: Urology

## 2023-09-11 NOTE — Progress Notes (Deleted)
 Name: Jacob Barnes DOB: 1940-04-20 MRN: [id number]  History of Present Illness: Jacob Barnes is a 83 y.o. male who presents today for follow up visit at [facility]. He resides at *** and is accompanied by ***daughter Jacob Barnes, who assists with providing history due to patient's dementia. GU History includes: 1. Kidney stones.  At last visit on 01/26/2023: - Asymptomatic aside from gross hematuria following left ESWL procedure on 01/02/2023 by Dr. Claretta Croft for management of a left proximal stone.  - The plan was:  1. Urine culture 2. Bactrim  2x/day x5 days. 3. CBC & BMP. 4. Return in about 2 weeks (around 02/09/2023) for UA, PVR, & f/u with Griselda Lederer NP.  Since last visit: > 02/14/2023: CT stone "Redemonstrated 0.8 cm stone at the level of the left renal pelvis without associated upstream left-sided pelvicaliectasis." Also had "Punctate (0.3 cm) nonobstructing right-sided renal stones. No evidence of right-sided urinary obstruction."  > 03/02/2023: Spoke with patient's daughter. "She was advised that his 8 mm distal left ureteral stone is still present per the CT done on 02/14/2023 and that Dr. Claretta Croft advised for Mr. Pickup to be scheduled for ureteroscopic stone manipulation surgery." Plan was for daughter to discuss with patient and notify provider of his decision; never heard back.    Today: KUB today: Awaiting radiology read; *** appreciated per provider interpretation.  He {Actions; denies-reports:120008} recent stone passage. He {Actions; denies-reports:120008} flank pain or abdominal pain. He {Actions; denies-reports:120008} fevers, nausea, or vomiting.  He {Actions; denies-reports:120008} increased urinary urgency, frequency, nocturia, dysuria, gross hematuria, hesitancy, straining to void, or sensations of incomplete emptying.   Medications: Current Outpatient Medications  Medication Sig Dispense Refill   amLODipine  (NORVASC ) 2.5 MG tablet TAKE 1 TABLET  BY MOUTH EVERY DAY 90 tablet 1   atorvastatin (LIPITOR) 10 MG tablet GIVE 1 TABLET BY MOUTH AT BEDTIME EVERY MONDAY/WEDNESDAY/FRIDAY FOR HIGH CHOLESTEROL 40 tablet 2   blood glucose meter kit and supplies Dispense based on patient and insurance preference. Use up to two times daily as directed. (FOR ICD-10 E10.9, E11.9). 1 each 0   CALCIUM -VITAMIN D  PO Take by mouth.     Continuous Glucose Sensor (FREESTYLE LIBRE 2 SENSOR) MISC Change sensor every 14 days 6 each 3   donepezil  (ARICEPT ) 10 MG tablet Take 1 tablet (10 mg total) by mouth at bedtime. 90 tablet 3   dorzolamide -timolol  (COSOPT ) 2-0.5 % ophthalmic solution 1 drop 2 (two) times daily.     ferrous sulfate  325 (65 FE) MG tablet Take 325 mg by mouth every other day.     insulin  degludec (TRESIBA  FLEXTOUCH) 100 UNIT/ML FlexTouch Pen Inject 14 Units into the skin at bedtime. 12 mL 3   Insulin  Pen Needle (PEN NEEDLES) 31G X 6 MM MISC 1 each by Does not apply route at bedtime. 100 each 2   latanoprost  (XALATAN ) 0.005 % ophthalmic solution 1 drop at bedtime.     levETIRAcetam  (KEPPRA ) 500 MG tablet TAKE 1 TABLET BY MOUTH TWICE A DAY 180 tablet 1   Potassium 99 MG TABS Take 99 mg by mouth daily.     sitaGLIPtin  (JANUVIA ) 100 MG tablet Take 1 tablet (100 mg total) by mouth daily. 90 tablet 1   tamsulosin  (FLOMAX ) 0.4 MG CAPS capsule TAKE 1 CAPSULE BY MOUTH EVERY DAY AFTER SUPPER 90 capsule 1   Vitamin D , Ergocalciferol , (DRISDOL ) 1.25 MG (50000 UNIT) CAPS capsule Take 1 capsule (50,000 Units total) by mouth every 7 (seven) days. 5 capsule 8  No current facility-administered medications for this visit.    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   Qualifier: Diagnosis of  By: Rodolph Clap MD, Margaret     GERD (gastroesophageal reflux disease)    Glaucoma    both eyes   History of kidney stones    Hyperlipemia    Hypertension    Iron  deficiency anemia 01/10/2014   Other pancytopenia (HCC) 01/10/2014   New in 01/2014, undergoing pathology review    Rash    front abdomen no drainage   Past Surgical History:  Procedure Laterality Date   AMPUTATION Right 09/01/2016   Procedure: AMPUTATION RIGHT MIDDLE FINGER TIP;  Surgeon: Arnie Lao, MD;  Location: [facility];  Service: Orthopedics;  Laterality: Right;   AMPUTATION Right 10/10/2016   Procedure: REPEAT IRRIGATION AND DEBRIDEMENT RIGHT MIDDLE FINGER WITH AMPUTATION THROUGH PROXIMAL PHALANX;  Surgeon: Arnie Lao, MD;  Location: [facility];  Service: Orthopedics;  Laterality: Right;   APPENDECTOMY     BIOPSY  01/13/2022   Procedure: BIOPSY;  Surgeon: Urban Garden, MD;  Location: [facility];  Service: Gastroenterology;;   COLONOSCOPY  09/06/2011   Procedure: COLONOSCOPY;  Surgeon: Ruby Corporal, MD;  Location: [facility];  Service: Endoscopy;  Laterality: N/A;  830   COLONOSCOPY WITH PROPOFOL  N/A 01/13/2022   Procedure: COLONOSCOPY WITH PROPOFOL ;  Surgeon: Urban Garden, MD;  Location: [facility];  Service: Gastroenterology;  Laterality: N/A;  200 ASA 3   CYSTOSCOPY/RETROGRADE/URETEROSCOPY/STONE EXTRACTION WITH BASKET     ESOPHAGOGASTRODUODENOSCOPY N/A 01/15/2014   Procedure: ESOPHAGOGASTRODUODENOSCOPY (EGD);  Surgeon: Ruby Corporal, MD;  Location: [facility];  Service: Endoscopy;  Laterality: N/A;  200   ESOPHAGOGASTRODUODENOSCOPY N/A 04/22/2014   Procedure: ESOPHAGOGASTRODUODENOSCOPY (EGD);  Surgeon: Ruby Corporal, MD;  Location: [facility];  Service:  Endoscopy;  Laterality: N/A;  240   EXTRACORPOREAL SHOCK WAVE LITHOTRIPSY Left 01/02/2023   Procedure: EXTRACORPOREAL SHOCK WAVE LITHOTRIPSY (ESWL);  Surgeon: Marco Severs, MD;  Location: [facility];  Service: Urology;  Laterality: Left;   EYE SURGERY Bilateral yrs ago   ioc for cataract   IR ANGIO EXTERNAL CAROTID SEL EXT CAROTID UNI L MOD SED  03/22/2022   IR ANGIO INTRA EXTRACRAN SEL INTERNAL CAROTID UNI L MOD SED  03/26/2022   IR ANGIOGRAM FOLLOW UP STUDY  03/26/2022   IR NEURO EACH ADD'L AFTER BASIC UNI LEFT (MS)  03/26/2022   IR TRANSCATH/EMBOLIZ  03/26/2022   MALONEY DILATION N/A 01/15/2014   Procedure: Londa Rival DILATION;  Surgeon: Ruby Corporal, MD;  Location: [facility];  Service: Endoscopy;  Laterality: N/A;   MALONEY DILATION  04/22/2014   Procedure: Londa Rival DILATION;  Surgeon: Ruby Corporal, MD;  Location: [facility];  Service: Endoscopy;;   POLYPECTOMY  01/13/2022   Procedure: POLYPECTOMY;  Surgeon: Urban Garden, MD;  Location: [facility];  Service: Gastroenterology;;  RADIOLOGY WITH ANESTHESIA Left 03/22/2022   Procedure: LEFT MMA EMBOLIZATION;  Surgeon: Augusto Blonder, MD;  Location: [facility] OR;  Service: Radiology;  Laterality: Left;   surgery for left eye bleedf  2017   Family History  Problem Relation Age of Onset   Diabetes Mother    Hypertension Mother    Hyperlipidemia Mother    Stroke Mother    Heart failure Mother    Diabetes Father    Heart disease Father    Hypertension Father    Hyperlipidemia Father    Heart attack Father    Diabetes Brother    Diabetes Brother    Diabetes Son    Social History   Socioeconomic History   Marital status: Married    Spouse name: Not on file   Number of children: Not on file   Years of education: Not on file   Highest education level: Not on file  Occupational History   Not on file  Tobacco Use   Smoking status: Never    Passive exposure: Never   Smokeless tobacco: Never  Vaping Use    Vaping status: Never Used  Substance and Sexual Activity   Alcohol use: No   Drug use: No   Sexual activity: Yes  Other Topics Concern   Not on file  Social History Narrative   Not on file   Social Drivers of Health   Financial Resource Strain: Low Risk  (07/13/2022)   Overall Financial Resource Strain (CARDIA)    Difficulty of Paying Living Expenses: Not hard at all  Food Insecurity: No Food Insecurity (07/18/2023)   Hunger Vital Sign    Worried About Running Out of Food in the Last Year: Never true    Ran Out of Food in the Last Year: Never true  Transportation Needs: No Transportation Needs (07/18/2023)   PRAPARE - Administrator, Civil Service (Medical): No    Lack of Transportation (Non-Medical): No  Physical Activity: Sufficiently Active (07/13/2022)   Exercise Vital Sign    Days of Exercise per Week: 4 days    Minutes of Exercise per Session: 60 min  Stress: No Stress Concern Present (07/13/2022)   [employer] of Occupational Health - Occupational Stress Questionnaire    Feeling of Stress : Not at all  Social Connections: Socially Integrated (07/13/2022)   Social Connection and Isolation Panel [NHANES]    Frequency of Communication with Friends and Family: More than three times a week    Frequency of Social Gatherings with Friends and Family: Once a week    Attends Religious Services: More than 4 times per year    Active Member of [employer] or Organizations: No    Attends Engineer, structural: 1 to 4 times per year    Marital Status: Married  Catering manager Violence: Not At Risk (07/18/2023)   Humiliation, Afraid, Rape, and Kick questionnaire    Fear of Current or Ex-Partner: No    Emotionally Abused: No    Physically Abused: No    Sexually Abused: No    SUBJECTIVE  Review of Systems Constitutional: Patient denies any unintentional weight loss or change in strength lntegumentary: Patient denies any rashes or pruritus Cardiovascular: Patient denies  chest pain or syncope Respiratory: Patient denies shortness of breath Gastrointestinal: ***Patient {Actions; denies-reports:120008} ***nausea, ***vomiting, ***constipation, ***diarrhea ***As per HPI Musculoskeletal: Patient denies muscle cramps or weakness Neurologic: Patient denies convulsions or seizures Allergic/Immunologic: Patient denies recent allergic reaction(s) Hematologic/Lymphatic: Patient denies bleeding tendencies Endocrine: Patient  denies heat/cold intolerance  GU: As per HPI.  OBJECTIVE There were no vitals filed for this visit. There is no height or weight on file to calculate BMI.  Physical Examination Constitutional: No obvious distress; patient is non-toxic appearing  Cardiovascular: No visible lower extremity edema.  Respiratory: The patient does not have audible wheezing/stridor; respirations do not appear labored  Gastrointestinal: Abdomen non-distended Musculoskeletal: Normal ROM of UEs  Skin: No obvious rashes/open sores  Neurologic: CN 2-12 grossly intact Psychiatric: Answered questions appropriately with normal affect  Hematologic/Lymphatic/Immunologic: No obvious bruises or sites of spontaneous bleeding  UA: ***negative ***positive for *** leukocytes, *** blood, ***nitrites Urine microscopy: *** WBC/hpf, *** RBC/hpf, *** bacteria ***otherwise unremarkable ***glucosuria (secondary to ***Jardiance ***Farxiga use)  PVR: *** ml  ASSESSMENT No diagnosis found.  ***We reviewed recent imaging results; ***awaiting radiology results, appears to have ***no acute findings per provider interpretation.  ***For stone prevention: Advised adequate hydration and we discussed option to consider low oxalate diet given that calcium  oxalate is the most common type of stone. Handout provided about stone prevention diet.  ***For recurrent stone formers: We discussed option to proceed with 24 hour urinalysis ([facility]) for metabolic stone evaluation, which may help with  targeted recommendations for dietary I medication therapies for stone prevention. Patient elected to ***proceed/ ***hold off.  Will plan to follow up in ***6 months / ***1 year with ***KUB ***RUS for stone surveillance or sooner if needed.  Patient verbalized understanding of and agreement with current plan. All questions were answered.  PLAN Advised the following: Maintain adequate fluid intake daily. Drink citrus juice (lemon, lime or orange juice) routinely. Low oxalate diet. No follow-ups on file.  No orders of the defined types were placed in this encounter.   It has been explained that the patient is to follow regularly with their PCP in addition to all other providers involved in their care and to follow instructions provided by these respective offices. Patient advised to contact urology clinic if any urologic-pertaining questions, concerns, new symptoms or problems arise in the interim period.  There are no Patient Instructions on file for this visit.  Electronically signed by:  Lauretta Ponto, MSN, FNP-C, CUNP 09/11/2023 9:07 AM

## 2023-09-12 ENCOUNTER — Other Ambulatory Visit: Payer: Self-pay | Admitting: Family Medicine

## 2023-09-24 ENCOUNTER — Ambulatory Visit: Admitting: Nutrition

## 2023-09-24 ENCOUNTER — Ambulatory Visit (INDEPENDENT_AMBULATORY_CARE_PROVIDER_SITE_OTHER): Payer: Medicare HMO | Admitting: Gastroenterology

## 2023-10-09 ENCOUNTER — Ambulatory Visit

## 2023-10-15 ENCOUNTER — Ambulatory Visit (INDEPENDENT_AMBULATORY_CARE_PROVIDER_SITE_OTHER): Admitting: Nurse Practitioner

## 2023-10-15 ENCOUNTER — Encounter: Attending: Family Medicine | Admitting: Nutrition

## 2023-10-15 ENCOUNTER — Encounter: Payer: Self-pay | Admitting: Nurse Practitioner

## 2023-10-15 VITALS — BP 112/60 | HR 60 | Ht 64.17 in | Wt 100.2 lb

## 2023-10-15 DIAGNOSIS — I10 Essential (primary) hypertension: Secondary | ICD-10-CM

## 2023-10-15 DIAGNOSIS — Z794 Long term (current) use of insulin: Secondary | ICD-10-CM

## 2023-10-15 DIAGNOSIS — R636 Underweight: Secondary | ICD-10-CM | POA: Insufficient documentation

## 2023-10-15 DIAGNOSIS — E1159 Type 2 diabetes mellitus with other circulatory complications: Secondary | ICD-10-CM

## 2023-10-15 DIAGNOSIS — E782 Mixed hyperlipidemia: Secondary | ICD-10-CM | POA: Diagnosis not present

## 2023-10-15 DIAGNOSIS — Z7984 Long term (current) use of oral hypoglycemic drugs: Secondary | ICD-10-CM

## 2023-10-15 DIAGNOSIS — E440 Moderate protein-calorie malnutrition: Secondary | ICD-10-CM | POA: Diagnosis present

## 2023-10-15 LAB — POCT GLYCOSYLATED HEMOGLOBIN (HGB A1C): Hemoglobin A1C: 8.3 % — AB (ref 4.0–5.6)

## 2023-10-15 MED ORDER — TRESIBA FLEXTOUCH 100 UNIT/ML ~~LOC~~ SOPN: 15.0000 [IU] | PEN_INJECTOR | Freq: Every day | SUBCUTANEOUS | 3 refills | Status: DC

## 2023-10-15 MED ORDER — SITAGLIPTIN PHOSPHATE 100 MG PO TABS: 100.0000 mg | ORAL_TABLET | Freq: Every day | ORAL | 1 refills | Status: DC

## 2023-10-15 NOTE — Progress Notes (Unsigned)
 Medical Nutrition Therapy  Appointment Start time:  1530  Appointment End time:  1630  Primary concerns today: Type 2 Dm  Referral diagnosis: E11.8 Preferred learning style: No preference  Learning readiness: Ready Here with his daughter who lives with him and helps hiim.   NUTRITION ASSESSMENT  83 yr old bmale referred for Type 2 DM and underweight; BMI 17. He is very very thin and cachetic. Walks with a cane. No body fat. Bony skeletal muscle. In good spirits. Likes to eat and say he has a good appetite.His daughter is here with him and lives with now.   He use to live with other family but now his daughter lives with him. Has lost about 15-20 lbs in the last few years or so. He was 115-20 lbs in 2023.  No dentures- they're broken. Appetite is good. Has alpha gal and can't eat beef, pork or dairy. Prefers vegetables and foods from garden. Does like sweets and cookies at times. Denies swalling issues, but due to no teeth or dentures, has chewing issues. His daughter assists with putting on his CGM. Sees Benton Rio, FNP at [facility]. Is on 15 units of Tresiba  now and BS are much better. Has a CGM now and blood sugars are improving. A1C down from 12.3% to 8.3%. Januvia  100 mg a day. His daughter notes she sometimes adjusts his night time insulin  if his blood sugar is low at night. Reviewed guidelines about giving insulin  and making sure BS is > 100 before giving insulin . Discussed the need snack of PB sandwich before giving insulin  to bring blood sugars above 100 Bowels and urine WNL reportedly.  He is motivated to eat good quality foods and take medications as prescribed.  He is in need of assistance with food and other resources of paying bills..Due to income restraints, it is difficult for him to afford MD copays for visits.  Will request referral to social worker. Due to limited strength and ability, He would  benefit from a personal care aid to assist with bathing, dressing and preparing meals  since his daughter works and can't be home all the time to assist with his ADL's.  Provided information on the local food bank.  Clinical Wt Readings from Last 3 Encounters:  10/15/23 100 lb 3.2 oz (45.5 kg)  09/07/23 98 lb 5.2 oz (44.6 kg)  07/18/23 100 lb 15.5 oz (45.8 kg)   Ht Readings from Last 3 Encounters:  10/15/23 5' 4.17 (1.63 m)  07/18/23 5' 4.17 (1.63 m)  07/13/23 5' 5 (1.651 m)   There is no height or weight on file to calculate BMI. @BMIFA @ Facility age limit for growth %iles is 20 years. Facility age limit for growth %iles is 20 years.  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   Qualifier: Diagnosis of  By: Antonetta MD, Margaret     GERD (gastroesophageal reflux disease)    Glaucoma    both eyes   History of kidney  stones    Hyperlipemia    Hypertension    Iron  deficiency anemia 01/10/2014   Other pancytopenia (HCC) 01/10/2014   New in 01/2014, undergoing pathology review    Rash    front abdomen no drainage    Medications:  Current Outpatient Medications on File Prior to Visit  Medication Sig Dispense Refill   amLODipine  (NORVASC ) 2.5 MG tablet TAKE 1 TABLET BY MOUTH EVERY DAY 90 tablet 1   atorvastatin (LIPITOR) 10 MG tablet GIVE 1 TABLET BY MOUTH AT BEDTIME EVERY MONDAY/WEDNESDAY/FRIDAY FOR HIGH CHOLESTEROL 40 tablet 2   blood glucose meter kit and supplies Dispense based on patient and insurance preference. Use up to two times daily as directed. (FOR ICD-10 E10.9, E11.9). 1 each 0   CALCIUM -VITAMIN D  PO Take by mouth.      Continuous Glucose Sensor (FREESTYLE LIBRE 2 SENSOR) MISC Change sensor every 14 days 6 each 3   donepezil  (ARICEPT ) 10 MG tablet TAKE 1 TABLET BY MOUTH EVERYDAY AT BEDTIME 90 tablet 3   dorzolamide -timolol  (COSOPT ) 2-0.5 % ophthalmic solution 1 drop 2 (two) times daily.     ferrous sulfate  325 (65 FE) MG tablet Take 325 mg by mouth every other day.     insulin  degludec (TRESIBA  FLEXTOUCH) 100 UNIT/ML FlexTouch Pen Inject 15 Units into the skin at bedtime. 15 mL 3   Insulin  Pen Needle (PEN NEEDLES) 31G X 6 MM MISC 1 each by Does not apply route at bedtime. 100 each 2   latanoprost  (XALATAN ) 0.005 % ophthalmic solution 1 drop at bedtime.     levETIRAcetam  (KEPPRA ) 500 MG tablet TAKE 1 TABLET BY MOUTH TWICE A DAY 180 tablet 1   Potassium 99 MG TABS Take 99 mg by mouth daily.     sitaGLIPtin  (JANUVIA ) 100 MG tablet Take 1 tablet (100 mg total) by mouth daily. 90 tablet 1   tamsulosin  (FLOMAX ) 0.4 MG CAPS capsule TAKE 1 CAPSULE BY MOUTH EVERY DAY AFTER SUPPER 90 capsule 1   Vitamin D , Ergocalciferol , (DRISDOL ) 1.25 MG (50000 UNIT) CAPS capsule Take 1 capsule (50,000 Units total) by mouth every 7 (seven) days. 5 capsule 8   No current facility-administered medications on file prior to visit.    Labs:  Lab Results  Component Value Date   HGBA1C 8.3 (A) 10/15/2023      Latest Ref Rng & Units 08/31/2023    8:47 AM 07/13/2023    4:45 PM 02/09/2023   10:10 AM  CMP  Glucose 70 - 99 mg/dL 821  809  807   BUN 8 - 23 mg/dL 16  16  15    Creatinine 0.61 - 1.24 mg/dL 8.80  8.74  8.29   Sodium 135 - 145 mmol/L 135  136  140   Potassium 3.5 - 5.1 mmol/L 3.5  4.6  4.1   Chloride 98 - 111 mmol/L 104  101  105   CO2 22 - 32 mmol/L 26  25  20    Calcium  8.9 - 10.3 mg/dL 8.7  8.7  9.1   Total Protein 6.5 - 8.1 g/dL 8.5  7.8  8.5   Total Bilirubin 0.0 - 1.2 mg/dL 0.4  0.4  0.3   Alkaline Phos 38 - 126 U/L 69  77  79   AST 15 - 41 U/L 19  19  25    ALT 0 - 44 U/L 17  14  22      Notable Signs/Symptoms:    Lifestyle & Dietary Hx Daughter lives with him [employer].  Estimated daily fluid intake: 30 oz Supplements:  VIt D Sleep:   Stress / self-care:  Current average weekly physical activity: ADL Very thin and frail and walks with a cane.  24-Hr Dietary Recall First Meal: pancake, potates and oatmeal,  Snack:  Second Meal: Watermelon Snack: misc Third Meal: Meat and some vegetables,  Snack: misc cookies Beverages: water   Estimated Energy Needs Calories: 1800-2000 Carbohydrate: 200g Protein: 135g Fat: 50g   NUTRITION DIAGNOSIS  NB-1.1 Food and nutrition-related knowledge deficit As related to inconsistent food intake.  As evidenced by BMI 17 and A1C 8.3%.   NUTRITION INTERVENTION  Nutrition education (E-1) on the following topics:  Nutrition and Diabetes education provided on My Plate, CHO counting, meal planning, portion sizes, timing of meals, avoiding snacks between meals unless having a low blood sugar, target ranges for A1C and blood sugars, signs/symptoms and treatment of hyper/hypoglycemia, monitoring blood sugars, taking medications as prescribed, benefits of exercising 30 minutes per day and prevention of complications of DM.  High calorie high protein diet   Handouts Provided Include  High Calorie High Protein Diet Plate Method   Learning Style & Readiness for Change Teaching method utilized: Visual & Auditory  Demonstrated degree of understanding via: Teach Back  Barriers to learning/adherence to lifestyle change: none  Goals Established by Pt Goal  Eat three meals per day Increase protein rich foods of eggs, beans, peas, lentils,chicken fish or [place] Don't skip meals Eat 2-3 starchy vegetables with meals. Make sure blood sugar is above 100 before giving bedtime insulin  at night May give 1/2 pb sandwich if needed to get blood sugar above 100 before giving insulin  at night.  MONITORING & EVALUATION Dietary intake, weekly physical activity, and  weight in 1 month.  Next Steps  Patient is to work on eating 3 balanced meals consistently per day.SABRA

## 2023-10-15 NOTE — Patient Instructions (Signed)
 Goal  Eat three meals per day Increase protein rich foods of eggs, beans, peas, lentils,chicken fish or [place] Don't skip meals Eat 2-3 starchy vegetables with meals. Make sure blood sugar is above 100 before giving bedtime insulin  at night Eat 1/2 pb sandwich if needed before taking insulin  to bring glucose up to 100 or greater.

## 2023-10-15 NOTE — Progress Notes (Signed)
 Endocrinology Follow Up Visit       10/15/2023, 3:47 PM   Subjective:    Patient ID: Jacob Barnes, male    DOB: [birth year].  Jacob Barnes is being seen in follow up after being seen in consultation for management of currently uncontrolled symptomatic diabetes requested by  [name], MD.   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   Anemia    Arthritis    hands   Carpal tunnel syndrome 08/05/2009   Qualifier: Diagnosis of  By: [name], [name]     Chronic kidney disease    Diabetes mellitus    says since 1979 type 2   Diabetes mellitus without complication (HCC)    Phreesia 04/03/2020   ERECTILE DYSFUNCTION, ORGANIC 01/20/2009   Qualifier: Diagnosis of  By: Antonetta MD, Margaret     GERD (gastroesophageal reflux disease)    Glaucoma    both eyes   History of kidney stones    Hyperlipemia    Hypertension    Iron  deficiency anemia 01/10/2014   Other pancytopenia (HCC) 01/10/2014   New in 01/2014, undergoing pathology review    Rash    front abdomen no drainage    Past Surgical History:  Procedure Laterality Date   AMPUTATION Right 09/01/2016   Procedure: AMPUTATION RIGHT MIDDLE FINGER TIP;  Surgeon: [name], MD;  Location: [facility];  Service: Orthopedics;  Laterality: Right;   AMPUTATION Right 10/10/2016   Procedure: REPEAT IRRIGATION AND DEBRIDEMENT RIGHT MIDDLE FINGER WITH AMPUTATION THROUGH PROXIMAL PHALANX;  Surgeon: [name], MD;  Location: [facility];  Service: Orthopedics;  Laterality: Right;   APPENDECTOMY     BIOPSY  01/13/2022   Procedure: BIOPSY;  Surgeon: [name], MD;  Location: [facility];  Service: Gastroenterology;;   COLONOSCOPY  09/06/2011   Procedure: COLONOSCOPY;  Surgeon: [name], MD;  Location: [facility];  Service:  Endoscopy;  Laterality: N/A;  830   COLONOSCOPY WITH PROPOFOL  N/A 01/13/2022   Procedure: COLONOSCOPY WITH PROPOFOL ;  Surgeon: [name], MD;  Location: [facility];  Service: Gastroenterology;  Laterality: N/A;  200 ASA 3   CYSTOSCOPY/RETROGRADE/URETEROSCOPY/STONE EXTRACTION WITH BASKET     ESOPHAGOGASTRODUODENOSCOPY N/A 01/15/2014   Procedure: ESOPHAGOGASTRODUODENOSCOPY (EGD);  Surgeon: [name], MD;  [facility]: [facility];  Service: Endoscopy;  Laterality: N/A;  200   ESOPHAGOGASTRODUODENOSCOPY N/A 04/22/2014   Procedure: ESOPHAGOGASTRODUODENOSCOPY (EGD);  Surgeon: [name], MD;  [facility];  Service: Endoscopy;  Laterality: N/A;  240   EXTRACORPOREAL SHOCK WAVE LITHOTRIPSY Left 01/02/2023   Procedure: EXTRACORPOREAL SHOCK WAVE LITHOTRIPSY (ESWL);  Surgeon: [name], MD;  Location: [facility];  Service: Urology;  Laterality: Left;   EYE SURGERY Bilateral yrs ago   ioc for cataract   IR ANGIO EXTERNAL CAROTID SEL EXT CAROTID UNI L MOD SED  03/22/2022   IR ANGIO INTRA EXTRACRAN SEL INTERNAL CAROTID UNI L MOD SED  03/26/2022   IR ANGIOGRAM FOLLOW UP STUDY  03/26/2022   IR NEURO  EACH ADD'L AFTER BASIC UNI LEFT (MS)  03/26/2022   IR TRANSCATH/EMBOLIZ  03/26/2022   MALONEY DILATION N/A 01/15/2014   Procedure: AGAPITO DILATION;  Surgeon: [name], MD;  [facility];  Service: Endoscopy;  Laterality: N/A;   MALONEY DILATION  04/22/2014   Procedure: AGAPITO DILATION;  Surgeon: [name], MD;  [facility];  Service: Endoscopy;;   POLYPECTOMY  01/13/2022   Procedure: POLYPECTOMY;  Surgeon: Eartha Flavors, Toribio, MD;  [facility]: [facility];  Service: Gastroenterology;;   RADIOLOGY WITH ANESTHESIA Left 03/22/2022   Procedure: LEFT MMA EMBOLIZATION;  Surgeon: Lanis Pupa, MD;  Location: [facility];  Service: Radiology;  Laterality: Left;   surgery for left eye bleedf  2017    Social History   Socioeconomic  History   Marital status: Married    Spouse name: Not on file   Number of children: Not on file   Years of education: Not on file   Highest education level: Not on file  Occupational History   Not on file  Tobacco Use   Smoking status: Never    Passive exposure: Never   Smokeless tobacco: Never  Vaping Use   Vaping status: Never Used  Substance and Sexual Activity   Alcohol use: No   Drug use: No   Sexual activity: Yes  Other Topics Concern   Not on file  Social History Narrative   Not on file   Social Drivers of Health   Financial Resource Strain: Low Risk  (07/13/2022)   Overall Financial Resource Strain (CARDIA)    Difficulty of Paying Living Expenses: Not hard at all  Food Insecurity: No Food Insecurity (07/18/2023)   Hunger Vital Sign    Worried About Running Out of Food in the Last Year: Never true    Ran Out of Food in the Last Year: Never true  Transportation Needs: No Transportation Needs (07/18/2023)   PRAPARE - Administrator, Civil Service (Medical): No    Lack of Transportation (Non-Medical): No  Physical Activity: Sufficiently Active (07/13/2022)   Exercise Vital Sign    Days of Exercise per Week: 4 days    Minutes of Exercise per Session: 60 min  Stress: No Stress Concern Present (07/13/2022)   [employer] of Occupational Health - Occupational Stress Questionnaire    Feeling of Stress : Not at all  Social Connections: Socially Integrated (07/13/2022)   Social Connection and Isolation Panel    Frequency of Communication with Friends and Family: More than three times a week    Frequency of Social Gatherings with Friends and Family: Once a week    Attends Religious Services: More than 4 times per year    Active Member of [employer] or Organizations: No    Attends Engineer, structural: 1 to 4 times per year    Marital Status: Married    Family History  Problem Relation Age of Onset   Diabetes Mother    Hypertension Mother    Hyperlipidemia  Mother    Stroke Mother    Heart failure Mother    Diabetes Father    Heart disease Father    Hypertension Father    Hyperlipidemia Father    Heart attack Father    Diabetes Brother    Diabetes Brother    Diabetes Son     Outpatient Encounter Medications as of 10/15/2023  Medication Sig   amLODipine  (NORVASC ) 2.5 MG tablet TAKE 1 TABLET BY MOUTH EVERY  DAY   atorvastatin (LIPITOR) 10 MG tablet GIVE 1 TABLET BY MOUTH AT BEDTIME EVERY MONDAY/WEDNESDAY/FRIDAY FOR HIGH CHOLESTEROL   blood glucose meter kit and supplies Dispense based on patient and insurance preference. Use up to two times daily as directed. (FOR ICD-10 E10.9, E11.9).   CALCIUM -VITAMIN D  PO Take by mouth.   Continuous Glucose Sensor (FREESTYLE LIBRE 2 SENSOR) MISC Change sensor every 14 days   donepezil  (ARICEPT ) 10 MG tablet TAKE 1 TABLET BY MOUTH EVERYDAY AT BEDTIME   dorzolamide -timolol  (COSOPT ) 2-0.5 % ophthalmic solution 1 drop 2 (two) times daily.   ferrous sulfate  325 (65 FE) MG tablet Take 325 mg by mouth every other day.   Insulin  Pen Needle (PEN NEEDLES) 31G X 6 MM MISC 1 each by Does not apply route at bedtime.   latanoprost  (XALATAN ) 0.005 % ophthalmic solution 1 drop at bedtime.   levETIRAcetam  (KEPPRA ) 500 MG tablet TAKE 1 TABLET BY MOUTH TWICE A DAY   Potassium 99 MG TABS Take 99 mg by mouth daily.   tamsulosin  (FLOMAX ) 0.4 MG CAPS capsule TAKE 1 CAPSULE BY MOUTH EVERY DAY AFTER SUPPER   Vitamin D , Ergocalciferol , (DRISDOL ) 1.25 MG (50000 UNIT) CAPS capsule Take 1 capsule (50,000 Units total) by mouth every 7 (seven) days.   [DISCONTINUED] insulin  degludec (TRESIBA  FLEXTOUCH) 100 UNIT/ML FlexTouch Pen Inject 14 Units into the skin at bedtime. (Patient taking differently: Inject 15 Units into the skin at bedtime.)   [DISCONTINUED] sitaGLIPtin  (JANUVIA ) 100 MG tablet Take 1 tablet (100 mg total) by mouth daily.   insulin  degludec (TRESIBA  FLEXTOUCH) 100 UNIT/ML FlexTouch Pen Inject 15 Units into the skin at  bedtime.   sitaGLIPtin  (JANUVIA ) 100 MG tablet Take 1 tablet (100 mg total) by mouth daily.   No facility-administered encounter medications on file as of 10/15/2023.    ALLERGIES: Allergies  Allergen Reactions   Alpha-Gal      VACCINATION STATUS: Immunization History  Administered Date(s) Administered   Fluad Quad(high Dose 65+) 02/27/2019, 01/20/2021, 02/16/2022   Fluad Trivalent(High Dose 65+) 02/09/2023   Influenza,inj,Quad PF,6+ Mos 03/17/2013, 03/17/2014, 03/18/2015, 01/04/2016, 12/05/2016, 12/02/2019   Influenza-Unspecified 01/17/2017   Moderna Sars-Covid-2 Vaccination 06/23/2019, 07/21/2019, 02/12/2020   PPD Test 08/16/2010, 08/18/2010, 11/03/2013   Pneumococcal Conjugate-13 11/03/2013   Pneumococcal Polysaccharide-23 12/21/2008   Td 12/21/2008    Diabetes He presents for his follow-up diabetic visit. He has type 2 diabetes mellitus. Onset time: He was diagnosed at approximate age of 49 years. His disease course has been improving. There are no hypoglycemic associated symptoms. Pertinent negatives for hypoglycemia include no confusion, headaches, pallor or seizures. Associated symptoms include blurred vision and fatigue. Pertinent negatives for diabetes include no chest pain, no polydipsia, no polyphagia, no polyuria, no weakness and no weight loss. There are no hypoglycemic complications. Symptoms are stable. Diabetic complications include impotence, nephropathy, peripheral neuropathy and PVD. Risk factors for coronary artery disease include diabetes mellitus, dyslipidemia, hypertension, male sex and sedentary lifestyle. Current diabetic treatment includes oral agent (monotherapy) and insulin  injections. He is compliant with treatment most of the time. His weight is fluctuating minimally. He is following a generally healthy diet. When asked about meal planning, he reported none. He has not had a previous visit with a dietitian. He never participates in exercise. His home blood  glucose trend is decreasing steadily. His overall blood glucose range is >200 mg/dl. (He presents today, accompanied by his daughter, with his CGM showing greatly improved glycemic profile overall.  His POCT A1c today is 8.3%, improvinging  drastically from last visit of 12.3%.  Analysis of his CGM shows TIR 36%, TAR 63%, TBR 1% with GMI of 8.1%.  ) An ACE inhibitor/angiotensin II receptor blocker is being taken. He does not see a podiatrist.Eye exam is current.  Hyperlipidemia This is a chronic problem. The current episode started more than 1 year ago. The problem is controlled. Recent lipid tests were reviewed and are normal. Exacerbating diseases include chronic renal disease and diabetes. There are no known factors aggravating his hyperlipidemia. Pertinent negatives include no chest pain, myalgias or shortness of breath. Current antihyperlipidemic treatment includes statins. The current treatment provides moderate improvement of lipids. Compliance problems include adherence to diet.  Risk factors for coronary artery disease include dyslipidemia, diabetes mellitus, hypertension, male sex, a sedentary lifestyle and family history.  Hypertension This is a chronic problem. The current episode started more than 1 year ago. The problem has been resolved since onset. The problem is controlled. Associated symptoms include blurred vision. Pertinent negatives include no chest pain, headaches, neck pain, palpitations or shortness of breath. There are no associated agents to hypertension. Risk factors for coronary artery disease include diabetes mellitus, dyslipidemia, male gender and sedentary lifestyle. Past treatments include ACE inhibitors. The current treatment provides moderate improvement. There are no compliance problems.  Hypertensive end-organ damage includes kidney disease and PVD. Identifiable causes of hypertension include chronic renal disease.      Review of systems  Constitutional: + stable body  weight,  current Body mass index is 17.11 kg/m. , + fatigue, no subjective hyperthermia, no subjective hypothermia Eyes: + blurry vision, no xerophthalmia ENT: no sore throat, no nodules palpated in throat, no dysphagia/odynophagia, no hoarseness Cardiovascular: no chest pain, no shortness of breath, no palpitations, no leg swelling Respiratory: no cough, no shortness of breath Gastrointestinal: no nausea/vomiting Musculoskeletal: no muscle/joint aches Skin: no rashes, no hyperemia Neurological: no tremors, no numbness, no tingling, no dizziness Psychiatric: no depression, no anxiety    Objective:    BP 112/60 (BP Location: Left Arm, Patient Position: Sitting, Cuff Size: Normal)   Pulse 60   Ht 5' 4.17 (1.63 m)   Wt 100 lb 3.2 oz (45.5 kg)   BMI 17.11 kg/m   Wt Readings from Last 3 Encounters:  10/15/23 100 lb 3.2 oz (45.5 kg)  09/07/23 98 lb 5.2 oz (44.6 kg)  07/18/23 100 lb 15.5 oz (45.8 kg)    BP Readings from Last 3 Encounters:  10/15/23 112/60  09/07/23 118/78  08/03/23 113/66     Physical Exam- Limited  Constitutional:  Body mass index is 17.11 kg/m. , not in acute distress Eyes:  EOMI, no exophthalmos,  Musculoskeletal: missing tips of some fingers, strength intact in all four extremities, no gross restriction of joint movements Skin:  no rashes, no hyperemia Neurological: no tremor with outstretched hands   Diabetic Foot Exam - Simple   No data filed     CMP ( most recent) CMP     Component Value Date/Time   NA 135 08/31/2023 0847   NA 136 07/13/2023 1645   K 3.5 08/31/2023 0847   CL 104 08/31/2023 0847   CO2 26 08/31/2023 0847   GLUCOSE 178 (H) 08/31/2023 0847   BUN 16 08/31/2023 0847   BUN 16 07/13/2023 1645   CREATININE 1.19 08/31/2023 0847   CREATININE 1.36 (H) 09/30/2019 1008   CALCIUM  8.7 (L) 08/31/2023 0847   PROT 8.5 (H) 08/31/2023 0847   PROT 7.8 07/13/2023 1645   ALBUMIN 3.4 (L)  08/31/2023 0847   ALBUMIN 3.8 07/13/2023 1645   AST  19 08/31/2023 0847   ALT 17 08/31/2023 0847   ALKPHOS 69 08/31/2023 0847   BILITOT 0.4 08/31/2023 0847   BILITOT 0.4 07/13/2023 1645   GFRNONAA >60 08/31/2023 0847   GFRNONAA 49 (L) 09/30/2019 1008   GFRAA 59 (L) 03/31/2020 0848   GFRAA 57 (L) 09/30/2019 1008     Diabetic Labs (most recent): Lab Results  Component Value Date   HGBA1C 8.3 (A) 10/15/2023   HGBA1C 12.3 (A) 06/04/2023   HGBA1C 8.8 (A) 10/31/2022   MICROALBUR 150 07/14/2020   MICROALBUR 8.5 09/23/2018   MICROALBUR 127.6 (H) 06/20/2018     Lipid Panel ( most recent) Lipid Panel     Component Value Date/Time   CHOL 67 (L) 07/13/2023 1645   TRIG 38 07/13/2023 1645   HDL 44 07/13/2023 1645   CHOLHDL 1.8 08/24/2022 0937   CHOLHDL 2.6 09/30/2019 1008   VLDL 7 11/27/2016 1000   LDLCALC 11 07/13/2023 1645   LDLCALC 58 09/30/2019 1008   LABVLDL 12 07/13/2023 1645      Lab Results  Component Value Date   TSH 0.668 02/09/2023   TSH 2.350 08/24/2022   TSH 0.905 03/29/2021   TSH 1.300 03/31/2020   TSH 1.77 09/23/2018   TSH 0.61 07/19/2015   TSH 0.952 03/18/2015   TSH 0.921 12/11/2013   TSH 1.392 10/29/2012   TSH 0.615 09/26/2011   FREET4 1.26 03/31/2020      Assessment & Plan:   1) Uncontrolled type 2 diabetes mellitus with hyperglycemia (HCC)  - Jacob Barnes has currently uncontrolled symptomatic type 2 DM since  83 years of age.  He presents today, accompanied by his daughter, with his CGM showing greatly improved glycemic profile overall.  His POCT A1c today is 8.3%, improvinging drastically from last visit of 12.3%.  Analysis of his CGM shows TIR 36%, TAR 63%, TBR 1% with GMI of 8.1%.      Recent labs reviewed.  - I had a long discussion with him about the progressive nature of diabetes and the pathology behind its complications. -his diabetes is complicated by peripheral arterial disease and he remains at a high risk for more acute and chronic complications which include CAD, CVA, CKD, retinopathy,  and neuropathy. These are all discussed in detail with him.  - Nutritional counseling repeated at each appointment due to patients tendency to fall back in to old habits.  - The patient admits there is a room for improvement in their diet and drink choices. -  Suggestion is made for the patient to avoid simple carbohydrates from their diet including Cakes, Sweet Desserts / Pastries, Ice Cream, Soda (diet and regular), Sweet Tea, Candies, Chips, Cookies, Sweet Pastries, Store Bought Juices, Alcohol in Excess of 1-2 drinks a day, Artificial Sweeteners, Coffee Creamer, and Sugar-free Products. This will help patient to have stable blood glucose profile and potentially avoid unintended weight gain.   - I encouraged the patient to switch to unprocessed or minimally processed complex starch and increased protein intake (animal or plant source), fruits, and vegetables.   - Patient is advised to stick to a routine mealtimes to eat 3 meals a day and avoid unnecessary snacks (to snack only to correct hypoglycemia).  - I have approached him with the following individualized plan to manage  his diabetes and patient agrees:   -In light of his advanced age and light body weight, avoiding hypoglycemia should be the number  1 priority in his care.   -He is advised to continue his Tresiba  15 units SQ nightly and continue Januvia  100 mg po daily.  We discussed the need to get back on track with low sugar diet.  He does see Santana for DM education today as well.  -He is encouraged to continue monitoring blood glucose at least twice daily (using his CGM), before breakfast and before bed, and to call the clinic if he has readings less than 70 or above 300 for 3 tests in a row.    - he is not a candidate for Metformin  due to concurrent renal insufficiency.  - he is not a candidate for incretin therapy due to his body habitus.   - Specific targets for  A1c;  LDL, HDL,  and Triglycerides were discussed with the  patient.  2) Blood Pressure /Hypertension:   his blood pressure is controlled to target for his age.   he is advised to continue his current medications including Benazepril  10 mg p.o. daily with breakfast .  3) Lipids/Hyperlipidemia:    His most recent lipid panel from 08/24/22 shows controlled LDL at 19.   He is advised to continue Crestor  5 mg po daily at bedtime.  Side effects and precautions discussed with him.  4)  Weight/Diet:  His Body mass index is 17.11 kg/m.   he is not a candidate for weight loss.   Exercise, and detailed carbohydrates information provided  -  detailed on discharge instructions.  5) Osteopenia: -He will not need bisphosphonate intervention at this time.  He is advised to continue on his Vitamin D  4000 units daily, as well as Calcium  carbonate 600 mg p.o. daily.  His PCP started him on Ergocalciferol  50000 units weekly.  6) Chronic Care/Health Maintenance: -he is on ACEI/ARB and Statin medications and is encouraged to initiate and continue to follow up with Ophthalmology, Dentist, Podiatrist at least yearly or according to recommendations, and advised to stay away from smoking. I have recommended yearly flu vaccine and pneumonia vaccine at least every 5 years; moderate intensity exercise for up to 150 minutes weekly; and  sleep for at least 7 hours a day.  - he is advised to maintain close follow up with [name], MD for primary care needs, as well as his other providers for optimal and coordinated care.     I spent  38  minutes in the care of the patient today including review of labs from CMP, Lipids, Thyroid Function, Hematology (current and previous including abstractions from other facilities); face-to-face time discussing  his blood glucose readings/logs, discussing hypoglycemia and hyperglycemia episodes and symptoms, medications doses, his options of short and long term treatment based on the latest standards of care / guidelines;  discussion about  incorporating lifestyle medicine;  and documenting the encounter. Risk reduction counseling performed per USPSTF guidelines to reduce obesity and cardiovascular risk factors.     Please refer to Patient Instructions for Blood Glucose Monitoring and Insulin /Medications Dosing Guide  in media tab for additional information. Please  also refer to  Patient Self Inventory in the Media  tab for reviewed elements of pertinent patient history.  Jacob Barnes participated in the discussions, expressed understanding, and voiced agreement with the above plans.  All questions were answered to his satisfaction. he is encouraged to contact clinic should he have any questions or concerns prior to his return visit.   Follow up plan: - Return in about 4 months (around 02/15/2024) for  Diabetes F/U with A1c in office, No previsit labs, Bring meter and logs.  Benton Rio, [facility] [address] [place], [name] [address] Phone: [phone] Fax: [phone]  10/15/2023, 3:47 PM

## 2023-10-16 ENCOUNTER — Other Ambulatory Visit: Payer: Self-pay | Admitting: Nurse Practitioner

## 2023-10-16 DIAGNOSIS — E1165 Type 2 diabetes mellitus with hyperglycemia: Secondary | ICD-10-CM

## 2023-10-16 DIAGNOSIS — E118 Type 2 diabetes mellitus with unspecified complications: Secondary | ICD-10-CM

## 2023-10-16 DIAGNOSIS — E1159 Type 2 diabetes mellitus with other circulatory complications: Secondary | ICD-10-CM

## 2023-10-17 ENCOUNTER — Encounter: Payer: Self-pay | Admitting: Nutrition

## 2023-10-30 ENCOUNTER — Other Ambulatory Visit: Payer: Self-pay | Admitting: Family Medicine

## 2023-11-04 ENCOUNTER — Other Ambulatory Visit: Payer: Self-pay | Admitting: Family Medicine

## 2023-11-13 ENCOUNTER — Encounter: Payer: Self-pay | Admitting: Family Medicine

## 2023-11-13 ENCOUNTER — Ambulatory Visit (INDEPENDENT_AMBULATORY_CARE_PROVIDER_SITE_OTHER): Admitting: Gastroenterology

## 2023-11-13 ENCOUNTER — Ambulatory Visit: Admitting: Family Medicine

## 2023-11-13 VITALS — BP 104/67 | HR 65 | Resp 18 | Ht 64.0 in | Wt 97.0 lb

## 2023-11-13 DIAGNOSIS — E440 Moderate protein-calorie malnutrition: Secondary | ICD-10-CM | POA: Diagnosis not present

## 2023-11-13 DIAGNOSIS — E1159 Type 2 diabetes mellitus with other circulatory complications: Secondary | ICD-10-CM

## 2023-11-13 DIAGNOSIS — E785 Hyperlipidemia, unspecified: Secondary | ICD-10-CM

## 2023-11-13 DIAGNOSIS — I10 Essential (primary) hypertension: Secondary | ICD-10-CM

## 2023-11-13 DIAGNOSIS — N183 Chronic kidney disease, stage 3 unspecified: Secondary | ICD-10-CM

## 2023-11-13 NOTE — Patient Instructions (Addendum)
 F/U in 4.5 months,  Fasting lipid, cmp and EGFr 3 tio 5 days before next appt  Iplease call the pharmacy and see the cost of januvia  30 day supply and ask for it that way if you can afford it, if not please get back in touch in the next 1 week so we can get a pharmacy consult  I will message Endo re the insulin  you are actually using  CONGRATS on much improved blood sugar  Please schedule his eye exam  Thanks for choosing [facility], we consider it a privelige to serve you.

## 2023-11-26 ENCOUNTER — Encounter: Payer: Self-pay | Admitting: Family Medicine

## 2023-11-26 NOTE — Assessment & Plan Note (Signed)
 Encouraged to increase oral intake intentionally for weight re gain, also to have ensure or g;ucerna supplements

## 2023-11-26 NOTE — Assessment & Plan Note (Signed)
 Followed by Nephrology,may need pt assistance with medication  prescribed, jardiance he is to let us  know

## 2023-11-26 NOTE — Assessment & Plan Note (Signed)
 Hyperlipidemia:Low fat diet discussed and encouraged.   Lipid Panel  Lab Results  Component Value Date   CHOL 67 (L) 07/13/2023   HDL 44 07/13/2023   LDLCALC 11 07/13/2023   TRIG 38 07/13/2023   CHOLHDL 1.8 08/24/2022     Updated lab needed at/ before next visit.

## 2023-11-26 NOTE — Assessment & Plan Note (Signed)
 Mucg improved, managed by Endo Diabetes associated with hypertension, hyperlipidemia, CKD, and neurological disease  Mr. Chapel is reminded of the importance of commitment to daily physical activity for 30 minutes or more, as able and the need to limit carbohydrate intake to 30 to 60 grams per meal to help with blood sugar control.   The need to take medication as prescribed, test blood sugar as directed, and to call between visits if there is a concern that blood sugar is uncontrolled is also discussed.   Mr. Nhan is reminded of the importance of daily foot exam, annual eye examination, and good blood sugar, blood pressure and cholesterol control.     Latest Ref Rng & Units 10/15/2023    3:24 PM 08/31/2023    8:47 AM 07/13/2023    4:45 PM 06/04/2023    3:47 PM 02/09/2023   10:10 AM  Diabetic Labs  HbA1c 4.0 - 5.6 % 8.3    12.3    Micro/Creat Ratio 0 - 29 mg/g creat     117   Chol 100 - 199 mg/dL   67     HDL >60 mg/dL   44     Calc LDL 0 - 99 mg/dL   11     Triglycerides 0 - 149 mg/dL   38     Creatinine 9.38 - 1.24 mg/dL  8.80  8.74   8.29       11/13/2023    4:01 PM 10/15/2023    3:06 PM 09/07/2023    8:07 AM 08/03/2023    1:26 PM 08/03/2023    8:57 AM 07/27/2023    1:26 PM 07/27/2023    8:12 AM  BP/Weight  Systolic BP 104 112 118 113 111 106 116  Diastolic BP 67 60 78 66 70 60 68  Wt. (Lbs) 97 100.2 98.33      BMI 16.65 kg/m2 17.11 kg/m2 16.79 kg/m2          Latest Ref Rng & Units 01/26/2022    2:00 PM 01/17/2022   12:00 AM  Foot/eye exam completion dates  Eye Exam No Retinopathy  Retinopathy      Foot Form Completion  Done      This result is from an external source.

## 2023-11-26 NOTE — Assessment & Plan Note (Signed)
 Controlled, no change in medication DASH diet and commitment to daily physical activity for a minimum of 30 minutes discussed and encouraged, as a part of hypertension management. The importance of attaining a healthy weight is also discussed.     11/13/2023    4:01 PM 10/15/2023    3:06 PM 09/07/2023    8:07 AM 08/03/2023    1:26 PM 08/03/2023    8:57 AM 07/27/2023    1:26 PM 07/27/2023    8:12 AM  BP/Weight  Systolic BP 104 112 118 113 111 106 116  Diastolic BP 67 60 78 66 70 60 68  Wt. (Lbs) 97 100.2 98.33      BMI 16.65 kg/m2 17.11 kg/m2 16.79 kg/m2

## 2023-11-26 NOTE — Progress Notes (Signed)
 Jacob Barnes     MRN: [id number]      DOB: September 15, 1940  Chief Complaint  Patient presents with   Hypertension    4 MONTH FOLLOW UP     HPI [name] is here for follow up and re-evaluation of chronic medical conditions, medication management and review of any available recent lab and radiology data.  Preventive health is updated, specifically  Cancer screening and Immunization.   Questions or concerns regarding consultations or procedures which the PT has had in the interim are  addressed. The PT denies any adverse reactions to current medications since the last visit.  There are no new concerns.  There are no specific complaints   ROS Denies recent fever or chills. Denies sinus pressure, nasal congestion, ear pain or sore throat. Denies chest congestion, productive cough or wheezing. Denies chest pains, palpitations and leg swelling Denies abdominal pain, nausea, vomiting,diarrhea or constipation.   Denies dysuria, frequency, hesitancy or incontinence. Denies joint pain, swelling and limitation in mobility. Denies headaches, seizures, numbness, or tingling. Denies depression, anxiety or insomnia. Denies skin break down or rash.   PE  BP 104/67   Pulse 65   Resp 18   Ht 5' 4 (1.626 m)   Wt 97 lb (44 kg)   SpO2 100%   BMI 16.65 kg/m   Patient alert and oriented and in no cardiopulmonary distress.  HEENT: No facial asymmetry, EOMI,     Neck supple .  Chest: Clear to auscultation bilaterally.  CVS: S1, S2 no murmurs, no S3.Regular rate.  ABD: Soft non tender.   Ext: No edema  MS: Adequate ROM spine, shoulders, hips and knees.  Skin: Intact, no ulcerations or rash noted.  Psych: Good eye contact, normal affect. Memory intact not anxious or depressed appearing.  CNS: CN 2-12 intact, power,  normal throughout.no focal deficits noted.   Assessment & Plan  Essential hypertension, benign Controlled, no change in medication DASH diet and commitment to daily  physical activity for a minimum of 30 minutes discussed and encouraged, as a part of hypertension management. The importance of attaining a healthy weight is also discussed.     11/13/2023    4:01 PM 10/15/2023    3:06 PM 09/07/2023    8:07 AM 08/03/2023    1:26 PM 08/03/2023    8:57 AM 07/27/2023    1:26 PM 07/27/2023    8:12 AM  BP/Weight  Systolic BP 104 112 118 113 111 106 116  Diastolic BP 67 60 78 66 70 60 68  Wt. (Lbs) 97 100.2 98.33      BMI 16.65 kg/m2 17.11 kg/m2 16.79 kg/m2           Hyperlipidemia LDL goal <100 Hyperlipidemia:Low fat diet discussed and encouraged.   Lipid Panel  Lab Results  Component Value Date   CHOL 67 (L) 07/13/2023   HDL 44 07/13/2023   LDLCALC 11 07/13/2023   TRIG 38 07/13/2023   CHOLHDL 1.8 08/24/2022     Updated lab needed at/ before next visit.   Type 2 diabetes mellitus with vascular disease (HCC) Mucg improved, managed by Endo Diabetes associated with hypertension, hyperlipidemia, CKD, and neurological disease  [name] is reminded of the importance of commitment to daily physical activity for 30 minutes or more, as able and the need to limit carbohydrate intake to 30 to 60 grams per meal to help with blood sugar control.   The need to take medication as prescribed, test blood sugar  as directed, and to call between visits if there is a concern that blood sugar is uncontrolled is also discussed.   [name] is reminded of the importance of daily foot exam, annual eye examination, and good blood sugar, blood pressure and cholesterol control.     Latest Ref Rng & Units 10/15/2023    3:24 PM 08/31/2023    8:47 AM 07/13/2023    4:45 PM 06/04/2023    3:47 PM 02/09/2023   10:10 AM  Diabetic Labs  HbA1c 4.0 - 5.6 % 8.3    12.3    Micro/Creat Ratio 0 - 29 mg/g creat     117   Chol 100 - 199 mg/dL   67     HDL >60 mg/dL   44     Calc LDL 0 - 99 mg/dL   11     Triglycerides 0 - 149 mg/dL   38     Creatinine 9.38 - 1.24 mg/dL  8.80  8.74    8.29       11/13/2023    4:01 PM 10/15/2023    3:06 PM 09/07/2023    8:07 AM 08/03/2023    1:26 PM 08/03/2023    8:57 AM 07/27/2023    1:26 PM 07/27/2023    8:12 AM  BP/Weight  Systolic BP 104 112 118 113 111 106 116  Diastolic BP 67 60 78 66 70 60 68  Wt. (Lbs) 97 100.2 98.33      BMI 16.65 kg/m2 17.11 kg/m2 16.79 kg/m2          Latest Ref Rng & Units 01/26/2022    2:00 PM 01/17/2022   12:00 AM  Foot/eye exam completion dates  Eye Exam No Retinopathy  Retinopathy      Foot Form Completion  Done      This result is from an external source.        Malnutrition of moderate degree (HCC) Encouraged to increase oral intake intentionally for weight re gain, also to have ensure or g;ucerna supplements  CKD (chronic kidney disease) stage 3, GFR 30-59 ml/min (HCC) Followed by Nephrology,may need pt assistance with medication  prescribed, jardiance he is to let us  know   Jacob Barnes     MRN: [id number]      DOB: December 16, 1940  Chief Complaint  Patient presents with   Hypertension    4 MONTH FOLLOW UP     HPI [name] is here for follow up and re-evaluation of chronic medical conditions, medication management and review of any available recent lab and radiology data.  Preventive health is updated, specifically  Cancer screening and Immunization.   Questions or concerns regarding consultations or procedures which the PT has had in the interim are  addressed. The PT denies any adverse reactions to current medications since the last visit.  There are no new concerns.  There are no specific complaints   ROS Denies recent fever or chills. Denies sinus pressure, nasal congestion, ear pain or sore throat. Denies chest congestion, productive cough or wheezing. Denies chest pains, palpitations and leg swelling Denies abdominal pain, nausea, vomiting,diarrhea or constipation.   Denies dysuria, frequency, hesitancy or incontinence. Denies joint pain, swelling and limitation in  mobility. Denies headaches, seizures, numbness, or tingling. Denies depression, anxiety or insomnia. Denies skin break down or rash.   PE  BP 104/67   Pulse 65   Resp 18   Ht 5' 4 (1.626 m)   Wt 97 lb (44 kg)  SpO2 100%   BMI 16.65 kg/m   Patient alert and oriented and in no cardiopulmonary distress.  HEENT: No facial asymmetry, EOMI,     Neck supple .  Chest: Clear to auscultation bilaterally.  CVS: S1, S2 no murmurs, no S3.Regular rate.  ABD: Soft non tender.   Ext: No edema  MS: Adequate though reduced  ROM spine, shoulders, hips and knees.  Skin: Intact, no ulcerations or rash noted.  Psych: Good eye contact, normal affect. Memory losst not anxious or depressed appearing.  CNS: CN 2-12 intact, markedly reduced vision,power,  normal throughout.no focal deficits noted.   Assessment & Plan Essential hypertension, benign Controlled, no change in medication DASH diet and commitment to daily physical activity for a minimum of 30 minutes discussed and encouraged, as a part of hypertension management. The importance of attaining a healthy weight is also discussed.     11/13/2023    4:01 PM 10/15/2023    3:06 PM 09/07/2023    8:07 AM 08/03/2023    1:26 PM 08/03/2023    8:57 AM 07/27/2023    1:26 PM 07/27/2023    8:12 AM  BP/Weight  Systolic BP 104 112 118 113 111 106 116  Diastolic BP 67 60 78 66 70 60 68  Wt. (Lbs) 97 100.2 98.33      BMI 16.65 kg/m2 17.11 kg/m2 16.79 kg/m2           Hyperlipidemia LDL goal <100 Hyperlipidemia:Low fat diet discussed and encouraged.   Lipid Panel  Lab Results  Component Value Date   CHOL 67 (L) 07/13/2023   HDL 44 07/13/2023   LDLCALC 11 07/13/2023   TRIG 38 07/13/2023   CHOLHDL 1.8 08/24/2022     Updated lab needed at/ before next visit.   Type 2 diabetes mellitus with vascular disease (HCC) Mucg improved, managed by Endo Diabetes associated with hypertension, hyperlipidemia, CKD, and neurological disease  Mr.  Jacob Barnes is reminded of the importance of commitment to daily physical activity for 30 minutes or more, as able and the need to limit carbohydrate intake to 30 to 60 grams per meal to help with blood sugar control.   The need to take medication as prescribed, test blood sugar as directed, and to call between visits if there is a concern that blood sugar is uncontrolled is also discussed.   [name] is reminded of the importance of daily foot exam, annual eye examination, and good blood sugar, blood pressure and cholesterol control.     Latest Ref Rng & Units 10/15/2023    3:24 PM 08/31/2023    8:47 AM 07/13/2023    4:45 PM 06/04/2023    3:47 PM 02/09/2023   10:10 AM  Diabetic Labs  HbA1c 4.0 - 5.6 % 8.3    12.3    Micro/Creat Ratio 0 - 29 mg/g creat     117   Chol 100 - 199 mg/dL   67     HDL >60 mg/dL   44     Calc LDL 0 - 99 mg/dL   11     Triglycerides 0 - 149 mg/dL   38     Creatinine 9.38 - 1.24 mg/dL  8.80  8.74   8.29       11/13/2023    4:01 PM 10/15/2023    3:06 PM 09/07/2023    8:07 AM 08/03/2023    1:26 PM 08/03/2023    8:57 AM 07/27/2023    1:26 PM 07/27/2023  8:12 AM  BP/Weight  Systolic BP 104 112 118 113 111 106 116  Diastolic BP 67 60 78 66 70 60 68  Wt. (Lbs) 97 100.2 98.33      BMI 16.65 kg/m2 17.11 kg/m2 16.79 kg/m2          Latest Ref Rng & Units 01/26/2022    2:00 PM 01/17/2022   12:00 AM  Foot/eye exam completion dates  Eye Exam No Retinopathy  Retinopathy      Foot Form Completion  Done      This result is from an external source.        Malnutrition of moderate degree (HCC) Encouraged to increase oral intake intentionally for weight re gain, also to have ensure or g;ucerna supplements  CKD (chronic kidney disease) stage 3, GFR 30-59 ml/min (HCC) Followed by Nephrology,may need pt assistance with medication  prescribed, jardiance he is to let us  know

## 2023-11-27 ENCOUNTER — Ambulatory Visit

## 2023-11-29 ENCOUNTER — Inpatient Hospital Stay: Attending: Hematology

## 2023-11-29 DIAGNOSIS — I129 Hypertensive chronic kidney disease with stage 1 through stage 4 chronic kidney disease, or unspecified chronic kidney disease: Secondary | ICD-10-CM | POA: Insufficient documentation

## 2023-11-29 DIAGNOSIS — R768 Other specified abnormal immunological findings in serum: Secondary | ICD-10-CM | POA: Insufficient documentation

## 2023-11-29 DIAGNOSIS — D509 Iron deficiency anemia, unspecified: Secondary | ICD-10-CM | POA: Diagnosis present

## 2023-11-29 DIAGNOSIS — E1122 Type 2 diabetes mellitus with diabetic chronic kidney disease: Secondary | ICD-10-CM | POA: Insufficient documentation

## 2023-11-29 DIAGNOSIS — Z794 Long term (current) use of insulin: Secondary | ICD-10-CM | POA: Diagnosis not present

## 2023-11-29 DIAGNOSIS — N183 Chronic kidney disease, stage 3 unspecified: Secondary | ICD-10-CM | POA: Insufficient documentation

## 2023-11-29 DIAGNOSIS — D696 Thrombocytopenia, unspecified: Secondary | ICD-10-CM | POA: Insufficient documentation

## 2023-11-29 DIAGNOSIS — E610 Copper deficiency: Secondary | ICD-10-CM | POA: Diagnosis not present

## 2023-11-29 DIAGNOSIS — Z79899 Other long term (current) drug therapy: Secondary | ICD-10-CM | POA: Diagnosis not present

## 2023-11-29 DIAGNOSIS — D649 Anemia, unspecified: Secondary | ICD-10-CM | POA: Diagnosis not present

## 2023-11-29 LAB — CBC WITH DIFFERENTIAL/PLATELET
Abs Immature Granulocytes: 0.5 K/uL — ABNORMAL HIGH (ref 0.00–0.07)
Band Neutrophils: 1 %
Basophils Absolute: 0 K/uL (ref 0.0–0.1)
Basophils Relative: 0 %
Eosinophils Absolute: 0 K/uL (ref 0.0–0.5)
Eosinophils Relative: 1 %
HCT: 31.3 % — ABNORMAL LOW (ref 39.0–52.0)
Hemoglobin: 10 g/dL — ABNORMAL LOW (ref 13.0–17.0)
Lymphocytes Relative: 34 %
Lymphs Abs: 1.6 K/uL (ref 0.7–4.0)
MCH: 30.7 pg (ref 26.0–34.0)
MCHC: 31.9 g/dL (ref 30.0–36.0)
MCV: 96 fL (ref 80.0–100.0)
Metamyelocytes Relative: 6 %
Monocytes Absolute: 0 K/uL — ABNORMAL LOW (ref 0.1–1.0)
Monocytes Relative: 1 %
Myelocytes: 4 %
Neutro Abs: 2.5 K/uL (ref 1.7–7.7)
Neutrophils Relative %: 53 %
Platelets: 109 K/uL — ABNORMAL LOW (ref 150–400)
RBC: 3.26 MIL/uL — ABNORMAL LOW (ref 4.22–5.81)
RDW: 15.5 % (ref 11.5–15.5)
WBC: 4.7 K/uL (ref 4.0–10.5)
nRBC: 0 % (ref 0.0–0.2)

## 2023-11-29 LAB — COMPREHENSIVE METABOLIC PANEL WITH GFR
ALT: 18 U/L (ref 0–44)
AST: 20 U/L (ref 15–41)
Albumin: 3.8 g/dL (ref 3.5–5.0)
Alkaline Phosphatase: 55 U/L (ref 38–126)
Anion gap: 9 (ref 5–15)
BUN: 28 mg/dL — ABNORMAL HIGH (ref 8–23)
CO2: 19 mmol/L — ABNORMAL LOW (ref 22–32)
Calcium: 8.7 mg/dL — ABNORMAL LOW (ref 8.9–10.3)
Chloride: 108 mmol/L (ref 98–111)
Creatinine, Ser: 2.14 mg/dL — ABNORMAL HIGH (ref 0.61–1.24)
GFR, Estimated: 30 mL/min — ABNORMAL LOW (ref >60)
Glucose, Bld: 242 mg/dL — ABNORMAL HIGH (ref 70–99)
Potassium: 3.7 mmol/L (ref 3.5–5.1)
Sodium: 136 mmol/L (ref 135–145)
Total Bilirubin: 0.7 mg/dL (ref 0.0–1.2)
Total Protein: 8.3 g/dL — ABNORMAL HIGH (ref 6.5–8.1)

## 2023-11-29 LAB — VITAMIN B12: Vitamin B-12: 827 pg/mL (ref 180–914)

## 2023-11-29 LAB — FERRITIN: Ferritin: 281 ng/mL (ref 24–336)

## 2023-11-29 LAB — IRON AND TIBC
Iron: 95 ug/dL (ref 45–182)
Saturation Ratios: 42 % — ABNORMAL HIGH (ref 17.9–39.5)
TIBC: 225 ug/dL — ABNORMAL LOW (ref 250–450)
UIBC: 130 ug/dL

## 2023-11-29 LAB — FOLATE: Folate: 15.9 ng/mL (ref >5.9)

## 2023-11-29 LAB — HEPATITIS PANEL, ACUTE
HCV Ab: NONREACTIVE
Hep A IgM: NONREACTIVE
Hep B C IgM: NONREACTIVE
Hepatitis B Surface Ag: NONREACTIVE

## 2023-11-30 ENCOUNTER — Encounter: Payer: Self-pay | Admitting: Radiology

## 2023-11-30 LAB — RHEUMATOID FACTOR: Rheumatoid fact SerPl-aCnc: <10 [IU]/mL (ref <14.0)

## 2023-11-30 LAB — ANTINUCLEAR ANTIBODIES, IFA: ANA Ab, IFA: NEGATIVE

## 2023-11-30 LAB — KAPPA/LAMBDA LIGHT CHAINS
Kappa free light chain: 87.6 mg/L — ABNORMAL HIGH (ref 3.3–19.4)
Kappa, lambda light chain ratio: 1.53 (ref 0.26–1.65)
Lambda free light chains: 57.4 mg/L — ABNORMAL HIGH (ref 5.7–26.3)

## 2023-12-01 LAB — COPPER, SERUM: Copper: 63 ug/dL — ABNORMAL LOW (ref 69–132)

## 2023-12-03 LAB — MULTIPLE MYELOMA PANEL, SERUM
Albumin SerPl Elph-Mcnc: 3.8 g/dL (ref 2.9–4.4)
Albumin/Glob SerPl: 1 (ref 0.7–1.7)
Alpha 1: 0.1 g/dL (ref 0.0–0.4)
Alpha2 Glob SerPl Elph-Mcnc: 0.5 g/dL (ref 0.4–1.0)
B-Globulin SerPl Elph-Mcnc: 0.9 g/dL (ref 0.7–1.3)
Gamma Glob SerPl Elph-Mcnc: 2.5 g/dL — ABNORMAL HIGH (ref 0.4–1.8)
Globulin, Total: 4.1 g/dL — ABNORMAL HIGH (ref 2.2–3.9)
IgA: 729 mg/dL — ABNORMAL HIGH (ref 61–437)
IgG (Immunoglobin G), Serum: 2705 mg/dL — ABNORMAL HIGH (ref 603–1613)
IgM (Immunoglobulin M), Srm: 65 mg/dL (ref 15–143)
Total Protein ELP: 7.9 g/dL (ref 6.0–8.5)

## 2023-12-03 LAB — CYCLIC CITRUL PEPTIDE ANTIBODY, IGG/IGA: CCP Antibodies IgG/IgA: 5 U (ref 0–19)

## 2023-12-04 ENCOUNTER — Ambulatory Visit (INDEPENDENT_AMBULATORY_CARE_PROVIDER_SITE_OTHER): Admitting: Gastroenterology

## 2023-12-06 ENCOUNTER — Inpatient Hospital Stay: Admitting: Oncology

## 2023-12-13 DIAGNOSIS — E610 Copper deficiency: Secondary | ICD-10-CM | POA: Insufficient documentation

## 2023-12-13 NOTE — Assessment & Plan Note (Addendum)
 Patient has chronic thrombocytopenia since from at least 2014 No hepatosplenomegaly on the CT scan from November 2024 No nutritional deficiency at this time Does not report any increased bleeding or bruises Patient has low copper  level Hepatitis panel: Negative  -Will replace copper  and recheck levels in 2 months

## 2023-12-13 NOTE — Assessment & Plan Note (Addendum)
 Patient has normocytic anemia that is not resolved with the iron  supplementation Multiple myeloma workup: SPEP: No M spike, IFE: Polyclonal increase Slightly elevated free light chains with normal ratio Elevated IgG and IgA Autoimmune workup: Negative  - Repeat labs are more or less stable with slight increase in free light chains and increased creatinine today - Discussed the role of bone marrow biopsy to rule out a bone marrow process.  I do not think patient is a great candidate for systemic therapy if this is multiple myeloma or any bone marrow process that requires treatment. - Patient would like to continue to monitor at this time and will rediscuss bone marrow biopsy at next visit if the Labs continue to worsen  Return to clinic in 2 months with labs

## 2023-12-13 NOTE — Assessment & Plan Note (Addendum)
 Patient has elevated free light chains with normal ratio Elevated IgG and IgA Differential includes autoimmune, chronic inflammation/infection  - Will repeat labs in 3 months - Will obtain autoimmune workup at that time

## 2023-12-13 NOTE — Progress Notes (Unsigned)
 [facility] at [facility]  HEMATOLOGY FOLLOW-UP VISIT  [name], MD  REASON FOR FOLLOW-UP: Normocytic anemia  ASSESSMENT & PLAN:  Patient is a 83 y.o. male following for normocytic anemia  Assessment & Plan Normocytic anemia Patient has normocytic anemia that is not resolved with the iron  supplementation Multiple myeloma workup: SPEP: No M spike, IFE: Polyclonal increase Slightly elevated free light chains with normal ratio Elevated IgG and IgA Autoimmune workup: Negative  - Repeat labs are more or less stable with slight increase in free light chains and increased creatinine today - Discussed the role of bone marrow biopsy to rule out a bone marrow process.  I do not think patient is a great candidate for systemic therapy if this is multiple myeloma or any bone marrow process that requires treatment. - Patient would like to continue to monitor at this time and will rediscuss bone marrow biopsy at next visit if the Labs continue to worsen  Return to clinic in 2 months with labs Thrombocytopenia [facility]) Patient has chronic thrombocytopenia since from at least 2014 No hepatosplenomegaly on the CT scan from November 2024 No nutritional deficiency at this time Does not report any increased bleeding or bruises Patient has low copper  level Hepatitis panel: Negative  -Will replace copper  and recheck levels in 2 months Elevated serum immunoglobulin free light chains Patient has elevated free light chains with normal ratio Elevated IgG and IgA Autoimmune workup: Negative Slightly increased free light chain consistent with worsening kidney function at this time  - Will repeat labs in 3 months Copper  deficiency Copper  level of 63 slightly lower than normal  - Recommended patient to take copper  2 mg daily - Recommended to avoid zinc supplementation  Will recheck levels in 2 months AKI (acute kidney injury) (HCC) Patient has worsening kidney  function today.  I am suspecting this is secondary to dehydration as patient has poor oral intake Worsening kidney function and anemia raises concern for multiple myeloma  - Will administer 1 L IV fluids today - Will reassess in 2 months    Orders Placed This Encounter  Procedures   Ferritin    Standing Status:   Future    Expected Date:   02/04/2024    Expiration Date:   05/04/2024   Folate    Standing Status:   Future    Expected Date:   02/04/2024    Expiration Date:   05/04/2024   Vitamin B12    Standing Status:   Future    Expected Date:   02/04/2024    Expiration Date:   05/04/2024   CBC with Differential/Platelet    Standing Status:   Future    Expected Date:   02/04/2024    Expiration Date:   05/04/2024   Comprehensive metabolic panel with GFR    Standing Status:   Future    Expected Date:   02/04/2024    Expiration Date:   05/04/2024   Kappa/lambda light chains    Standing Status:   Future    Expected Date:   02/04/2024    Expiration Date:   05/04/2024   Multiple Myeloma Panel (SPEP&IFE w/QIG)    Standing Status:   Future    Expected Date:   02/04/2024    Expiration Date:   05/04/2024   Iron  and TIBC    Standing Status:   Future    Expected Date:   02/04/2024    Expiration Date:   05/04/2024   Copper ,  serum    Standing Status:   Future    Expected Date:   02/04/2024    Expiration Date:   05/04/2024   Ambulatory referral to Social Work    Referral Priority:   Routine    Referral Type:   Consultation    Referral Reason:   Specialty Services Required    Number of Visits Requested:   1    The total time spent in the appointment was 30 minutes encounter with patients including review of chart and various tests results, discussions about plan of care and coordination of care plan   All questions were answered. The patient knows to call the clinic with any problems, questions or concerns. No barriers to learning was detected.  Mickiel Dry, MD 9/5/202510:03 PM    SUMMARY OF HEMATOLOGIC HISTORY: Normocytic anemia: Likely secondary to iron  deficiency and a component of anemia of chronic inflammation - S/p IV iron  Venofer  500 mg X 2 doses on 07/27/2023 and 08/02/2023 -Multiple myeloma workup: SPEP: No M spike, IFE: Polyclonal increase -Slightly elevated free light chains with normal ratio -Elevated IgG and IgA -Autoimmune workup: Negative  INTERVAL HISTORY: Jacob Barnes 83 y.o. male following for normocytic anemia. Patient was accompanied by his daughter today.    Patient feels generally well and denies fatigue.  His daughter notes adequate eating and drinking habits, though she wishes he would increase his water  intake.  Patient has recently moved in with daughter and daughter stated that she is trying to take care of all his health stuff and getting things in order.  We discussed that his copper  levels are low and daughter thinks that he was on zinc supplementation for a while.  He denies melena, hematochezia, weight loss or loss of appetite.  Patient has significant hearing difficulty.   I have reviewed the past medical history, past surgical history, social history and family history with the patient   ALLERGIES:  is allergic to alpha-gal.  MEDICATIONS:  Current Outpatient Medications  Medication Sig Dispense Refill   amLODipine  (NORVASC ) 2.5 MG tablet TAKE 1 TABLET BY MOUTH EVERY DAY 90 tablet 1   atorvastatin (LIPITOR) 10 MG tablet GIVE 1 TABLET BY MOUTH AT BEDTIME EVERY MONDAY/WEDNESDAY/FRIDAY FOR HIGH CHOLESTEROL 40 tablet 2   blood glucose meter kit and supplies Dispense based on patient and insurance preference. Use up to two times daily as directed. (FOR ICD-10 E10.9, E11.9). 1 each 0   CALCIUM -VITAMIN D  PO Take by mouth.     Continuous Glucose Sensor (FREESTYLE LIBRE 2 SENSOR) MISC Change sensor every 14 days 6 each 3   copper  tablet Take 1 tablet (2 mg total) by mouth daily. 30 tablet 3   donepezil  (ARICEPT ) 10 MG tablet TAKE 1 TABLET  BY MOUTH EVERYDAY AT BEDTIME 90 tablet 3   dorzolamide -timolol  (COSOPT ) 2-0.5 % ophthalmic solution 1 drop 2 (two) times daily.     ferrous sulfate  325 (65 FE) MG tablet Take 325 mg by mouth every other day.     insulin  glargine (LANTUS ) 100 UNIT/ML injection Inject 0.15 mLs (15 Units total) into the skin at bedtime. 15 mL 0   Insulin  Pen Needle (PEN NEEDLES) 31G X 6 MM MISC 1 each by Does not apply route at bedtime. 100 each 2   latanoprost  (XALATAN ) 0.005 % ophthalmic solution 1 drop at bedtime.     levETIRAcetam  (KEPPRA ) 500 MG tablet TAKE 1 TABLET BY MOUTH TWICE A DAY 180 tablet 1   Potassium 99 MG TABS Take 99  mg by mouth daily.     sitaGLIPtin  (JANUVIA ) 100 MG tablet Take 1 tablet (100 mg total) by mouth daily. 90 tablet 1   tamsulosin  (FLOMAX ) 0.4 MG CAPS capsule TAKE 1 CAPSULE BY MOUTH EVERY DAY AFTER SUPPER 90 capsule 1   Vitamin D , Ergocalciferol , (DRISDOL ) 1.25 MG (50000 UNIT) CAPS capsule Take 1 capsule (50,000 Units total) by mouth every 7 (seven) days. 5 capsule 8   No current facility-administered medications for this visit.     REVIEW OF SYSTEMS:   Constitutional: Denies fevers, chills or night sweats Eyes: Denies blurriness of vision Ears, nose, mouth, throat, and face: Denies mucositis or sore throat Respiratory: Denies cough, dyspnea or wheezes Cardiovascular: Denies palpitation, chest discomfort or lower extremity swelling Gastrointestinal:  Denies nausea, heartburn or change in bowel habits Skin: Denies abnormal skin rashes Lymphatics: Denies new lymphadenopathy or easy bruising Neurological:Denies numbness, tingling or new weaknesses Behavioral/Psych: Mood is stable, no new changes  All other systems were reviewed with the patient and are negative.  PHYSICAL EXAMINATION:   Vitals:   12/14/23 0855  BP: 96/66  Pulse: 62  Resp: 17  Temp: 97.6 F (36.4 C)  SpO2: 100%    GENERAL:alert, no distress and frail male with hearing difficulty  SKIN: skin color,  texture, turgor are normal, no rashes or significant lesions LUNGS: clear to auscultation and percussion with normal breathing effort HEART: regular rate & rhythm and no murmurs and no lower extremity edema ABDOMEN:abdomen soft, non-tender and normal bowel sounds Musculoskeletal:no cyanosis of digits and no clubbing  NEURO: alert & oriented x 3 with fluent speech  LABORATORY DATA:  I have reviewed the data as listed  Lab Results  Component Value Date   WBC 4.7 11/29/2023   NEUTROABS 2.5 11/29/2023   HGB 10.0 (L) 11/29/2023   HCT 31.3 (L) 11/29/2023   MCV 96.0 11/29/2023   PLT 109 (L) 11/29/2023        Chemistry      Component Value Date/Time   NA 136 11/29/2023 1440   NA 136 07/13/2023 1645   K 3.7 11/29/2023 1440   CL 108 11/29/2023 1440   CO2 19 (L) 11/29/2023 1440   BUN 28 (H) 11/29/2023 1440   BUN 16 07/13/2023 1645   CREATININE 2.14 (H) 11/29/2023 1440   CREATININE 1.36 (H) 09/30/2019 1008      Component Value Date/Time   CALCIUM  8.7 (L) 11/29/2023 1440   ALKPHOS 55 11/29/2023 1440   AST 20 11/29/2023 1440   ALT 18 11/29/2023 1440   BILITOT 0.7 11/29/2023 1440   BILITOT 0.4 07/13/2023 1645      Latest Reference Range & Units 11/29/23 14:40  Iron  45 - 182 ug/dL 95  UIBC ug/dL 869  TIBC 749 - 549 ug/dL 774 (L)  Saturation Ratios 17.9 - 39.5 % 42 (H)  Ferritin 24 - 336 ng/mL 281  Folate >5.9 ng/mL 15.9  Copper  69 - 132 ug/dL 63 (L)  Vitamin B12 819 - 914 pg/mL 827  (L): Data is abnormally low (H): Data is abnormally high   Latest Reference Range & Units 11/29/23 14:40  Total Protein ELP 6.0 - 8.5 g/dL 7.9 (C)  Albumin SerPl Elph-Mcnc 2.9 - 4.4 g/dL 3.8 (C)  Albumin/Glob SerPl 0.7 - 1.7  1.0 (C)  Alpha2 Glob SerPl Elph-Mcnc 0.4 - 1.0 g/dL 0.5 (C)  Alpha 1 0.0 - 0.4 g/dL 0.1 (C)  Gamma Glob SerPl Elph-Mcnc 0.4 - 1.8 g/dL 2.5 (H) (C)  M Protein SerPl Elph-Mcnc  Not Observed g/dL Not Observed (C)  IFE 1  Polyclonal increase detected in one or more  immunoglobulins.   Globulin, Total 2.2 - 3.9 g/dL 4.1 (H) (C)  B-Globulin SerPl Elph-Mcnc 0.7 - 1.3 g/dL 0.9 (C)  IgG (Immunoglobin G), Serum 603 - 1,613 mg/dL 7,294 (H)  IgM (Immunoglobulin M), Srm 15 - 143 mg/dL 65  IgA 61 - 562 mg/dL 270 (H)  (H): Data is abnormally high !: Data is abnormal (C): Corrected   Latest Reference Range & Units 11/29/23 14:40  Kappa free light chain 3.3 - 19.4 mg/L 87.6 (H)  Lambda free light chains 5.7 - 26.3 mg/L 57.4 (H)  Kappa, lambda light chain ratio 0.26 - 1.65  1.53  (H): Data is abnormally high   Latest Reference Range & Units 11/29/23 14:40  ANA Ab, IFA  Negative  CCP Antibodies IgG/IgA 0 - 19 units 5  RA Latex Turbid. <14.0 IU/mL <10.0   RADIOGRAPHIC STUDIES: I have personally reviewed the radiological images as listed and agreed with the findings in the report.  None new to review

## 2023-12-13 NOTE — Assessment & Plan Note (Deleted)
 The most likely cause of his anemia is due to chronic blood loss.   Previous colonoscopy showed some tubular adenoma, polyps.  Patient is scheduled to see GI in August Iron  levels and hemoglobin improved with IV iron .  But still anemic.  - Continue oral iron  every other day.  Use MiraLAX for constipation - Continue to follow-up with GI  Return to clinic in 3 months with labs

## 2023-12-14 ENCOUNTER — Inpatient Hospital Stay: Attending: Hematology | Admitting: Oncology

## 2023-12-14 ENCOUNTER — Inpatient Hospital Stay

## 2023-12-14 VITALS — BP 96/66 | HR 62 | Temp 97.6°F | Resp 17

## 2023-12-14 DIAGNOSIS — H9190 Unspecified hearing loss, unspecified ear: Secondary | ICD-10-CM | POA: Insufficient documentation

## 2023-12-14 DIAGNOSIS — E610 Copper deficiency: Secondary | ICD-10-CM | POA: Insufficient documentation

## 2023-12-14 DIAGNOSIS — E860 Dehydration: Secondary | ICD-10-CM | POA: Insufficient documentation

## 2023-12-14 DIAGNOSIS — D696 Thrombocytopenia, unspecified: Secondary | ICD-10-CM | POA: Diagnosis not present

## 2023-12-14 DIAGNOSIS — D500 Iron deficiency anemia secondary to blood loss (chronic): Secondary | ICD-10-CM

## 2023-12-14 DIAGNOSIS — R7989 Other specified abnormal findings of blood chemistry: Secondary | ICD-10-CM | POA: Insufficient documentation

## 2023-12-14 DIAGNOSIS — N179 Acute kidney failure, unspecified: Secondary | ICD-10-CM | POA: Diagnosis not present

## 2023-12-14 DIAGNOSIS — R768 Other specified abnormal immunological findings in serum: Secondary | ICD-10-CM | POA: Diagnosis not present

## 2023-12-14 DIAGNOSIS — D649 Anemia, unspecified: Secondary | ICD-10-CM | POA: Insufficient documentation

## 2023-12-14 MED ORDER — COPPER 2 MG PO TABS: 2.0000 mg | Freq: Every day | 3 refills | Status: DC

## 2023-12-14 MED ORDER — SODIUM CHLORIDE 0.9 % IV SOLN: Freq: Once | INTRAVENOUS | Status: AC

## 2023-12-14 NOTE — Assessment & Plan Note (Addendum)
 Patient has worsening kidney function today.  I am suspecting this is secondary to dehydration as patient has poor oral intake Worsening kidney function and anemia raises concern for multiple myeloma  - Will administer 1 L IV fluids today - Will reassess in 2 months

## 2023-12-14 NOTE — Patient Instructions (Signed)
 [facility] [place] - A DEPT OF [facility]  Discharge Instructions: Thank you for choosing [facility] to provide your oncology and hematology care.  If you have a lab appointment with the Cancer Center - please note that after April 8th, 2024, all labs will be drawn in the cancer center.  You do not have to check in or register with the main entrance as you have in the past but will complete your check-in in the cancer center.  Wear comfortable clothing and clothing appropriate for easy access to any Portacath or PICC line.   We strive to give you quality time with your provider. You may need to reschedule your appointment if you arrive late (15 or more minutes).  Arriving late affects you and other patients whose appointments are after yours.  Also, if you miss three or more appointments without notifying the office, you may be dismissed from the clinic at the provider's discretion.      For prescription refill requests, have your pharmacy contact our office and allow 72 hours for refills to be completed.    Today you received the following hydration, return as scheduled.   To help prevent nausea and vomiting after your treatment, we encourage you to take your nausea medication as directed.  BELOW ARE SYMPTOMS THAT SHOULD BE REPORTED IMMEDIATELY: *FEVER GREATER THAN 100.4 F (38 C) OR HIGHER *CHILLS OR SWEATING *NAUSEA AND VOMITING THAT IS NOT CONTROLLED WITH YOUR NAUSEA MEDICATION *UNUSUAL SHORTNESS OF BREATH *UNUSUAL BRUISING OR BLEEDING *URINARY PROBLEMS (pain or burning when urinating, or frequent urination) *BOWEL PROBLEMS (unusual diarrhea, constipation, pain near the anus) TENDERNESS IN MOUTH AND THROAT WITH OR WITHOUT PRESENCE OF ULCERS (sore throat, sores in mouth, or a toothache) UNUSUAL RASH, SWELLING OR PAIN  UNUSUAL VAGINAL DISCHARGE OR ITCHING   Items with * indicate a potential emergency and should be followed up as soon as possible  or go to the Emergency Department if any problems should occur.  Please show the CHEMOTHERAPY ALERT CARD or IMMUNOTHERAPY ALERT CARD at check-in to the Emergency Department and triage nurse.  Should you have questions after your visit or need to cancel or reschedule your appointment, please contact [facility] [place] - A DEPT OF [facility] [phone]  and follow the prompts.  Office hours are 8:00 a.m. to 4:30 p.m. Monday - Friday. Please note that voicemails left after 4:00 p.m. may not be returned until the following business day.  We are closed weekends and major holidays. You have access to a nurse at all times for urgent questions. Please call the main number to the clinic [phone] and follow the prompts.  For any non-urgent questions, you may also contact your provider using MyChart. We now offer e-Visits for anyone 23 and older to request care online for non-urgent symptoms. For details visit [email or web address].   Also download the MyChart app! Go to the app store, search MyChart, open the app, select [place], and log in with your MyChart username and password.

## 2023-12-14 NOTE — Assessment & Plan Note (Signed)
 Copper  level of 63 slightly lower than normal  - Recommended patient to take copper  2 mg daily - Recommended to avoid zinc supplementation  Will recheck levels in 2 months

## 2023-12-14 NOTE — Progress Notes (Signed)
 Patient presents today for 1L of normal saline per providers orders. Patient tolerated hydration with no complaints voiced. Peripheral IV site clean and dry with good blood return noted before and after infusion. Band aid applied. VSS with discharge and left in satisfactory condition with no s/s of distress noted.

## 2023-12-19 ENCOUNTER — Inpatient Hospital Stay: Admitting: Licensed Clinical Social Worker

## 2023-12-19 DIAGNOSIS — D649 Anemia, unspecified: Secondary | ICD-10-CM

## 2023-12-19 NOTE — Progress Notes (Signed)
 [facility] CSW Progress Note  Clinical Social Worker left a voicemail for pt's daughter to follow-up on advance directives questions.    Interventions: CSW mailed an advanced directives packet to pt's home at the request of his daughter.  Voicemail left for pt's daughter informing of the above.  CSW also provided contact details and informed of availability to complete and notarize documents should he wish.        Follow Up Plan:  Patient will contact CSW with any support or resource needs    Devere JONELLE Manna, LCSW Clinical Social Worker [facility]

## 2023-12-20 NOTE — Progress Notes (Signed)
 Patient Name: Jacob Barnes, male   Patient DOB: April 01, 1941 Date of Service: 12/20/2023  Patient MRN: [id number] Provider Creating Note: Saralee Stank, MD  [phone] Primary Care Physician: [name], MD  [address] [place] [address] Additional Physicians/ Providers:   Chief Complaint   Chief Complaint  Patient presents with  . Follow-up    History of Present Illness Jacob Barnes is a 83 y.o. 2-Black or African American male who is seen as a new evaluation. Patient has medical history as follows: Hypertension Malnutrition Diabetes type 2- insulin  dependent; Dx Age 72-13 Chronic kidney disease Kidney stone Body wall anasarca Aortic atherosclerosis Stroke in 2023-patient reports no residual deficits  He is seen today for evaluation of worsening creatinine.  He is accompanied by his daughter Ms. Jacob Barnes. Review of records show that his baseline creatinine is 1.03 from December 2023.  Since then it has progressively been worsening.  March to September his creatinine had probably established a new baseline of 1.2/GFR 57. In October 2024, it was noted to have increased to 1.54, GFR 44 February 09, 2023-creatinine was 1.70, GFR 40. A CT abdomen stone study from 02/14/2023 shows 0.8 cm stone at the left renal pelvis without pelvicaliectasis.  There is also punctate stones, nonobstructing, in the right kidney.  Attenuation of the bilateral renal caliectasis suggesting additional timely kidney stone versus nephrocalcinosis.  Diffuse thickening of urinary bladder was noted.  Gross Hematuria: Off and on.  Not now. Hx of Kidney Stone: Yes in 2024. Smoking: Never smoker  He does not have any acute complaints today.  He is hard of hearing.  Main concern is that he has been losing weight.  His previous weight was 103 pounds in December 2024.  Today's weight is down to 94 pounds. Blood pressure is low normal at 100/56. He seems to be having loose stools.  He had an accident in our office  today.   Problem List There is no problem list on file for this patient.   Physical Exam  Vitals BP 100/56 (BP Location: Left upper arm, Patient Position: Sitting)   Pulse 66   Temp 97.8 F   Wt 94 lb 9.6 oz (42.9 kg)   SpO2 99%   BMI 15.74 kg/m  Physical Exam  General Appearance-frail, elderly cane HEENT-moist oral mucous membranes Pulmonary: Normal breathing effort on room air to auscultation Cardiac: Regular, no rub soft systolic murmur neck Extremities: Right hand deformity from accident, no peripheral edema, clubbing noted Neuro: Significant peripheral muscle wasting Skin-warm, dry  Medications   Current Outpatient Medications:  .  acetaminophen  (TYLENOL  8 HOUR) 650 MG 8 hr tablet, Take 650 mg by mouth Twice a week. Do not crush, chew, or split., Disp: , Rfl:  .  Cholecalciferol (Vitamin D -3) 25 MCG (1000 UT) capsule, Take by mouth Every other day, Disp: , Rfl:  .  atorvastatin (LIPITOR) 10 MG tablet, GIVE 1 TABLET BY MOUTH AT BEDTIME EVERY MONDAY/WEDNESDAY/FRIDAY FOR HIGH CHOLESTEROL, Disp: , Rfl:  .  CALCIUM -VITAMIN D  PO, Take by mouth 4 (four) times a week, Disp: , Rfl:  .  donepezil  (ARICEPT ) 10 MG tablet, Take 1 tablet by mouth every night, Disp: , Rfl:  .  dorzolamide -timolol  (COSOPT ) 2-0.5 % ophthalmic solution, 1 drop 2 (two) times daily., Disp: , Rfl:  .  insulin  degludec (Tresiba  FlexTouch) 100 UNIT/ML injection, Inject 14 Units under the skin every night As needed, Disp: , Rfl:  .  latanoprost  (XALATAN ) 0.005 % ophthalmic solution, 1 drop every night,  Disp: , Rfl:  .  levETIRAcetam  (KEPPRA ) 500 MG tablet, Take 500 mg by mouth in the morning and 500 mg in the evening., Disp: , Rfl:  .  mirtazapine (Remeron) 15 MG tablet, Take 1 tablet (15 mg total) by mouth every night, Disp: 30 tablet, Rfl: 2 .  POTASSIUM PO, Take 99 mg by mouth 1 (one) time each day if needed, Disp: , Rfl:  .  SITagliptin  (JANUVIA ) 100 MG tablet, Take 100 mg by mouth 1 (one) time each day,  Disp: , Rfl:  .  tamsulosin  (FLOMAX ) 0.4 MG 24 hr capsule, TAKE 1 CAPSULE BY MOUTH EVERY DAY AFTER SUPPER, Disp: , Rfl:    Allergies Alpha-gal  Imaging and Other Studies  CLINICAL DATA:  Symptomatic left-sided ureteral stone  EXAM: CT ABDOMEN AND PELVIS WITHOUT CONTRAST  TECHNIQUE: Multidetector CT imaging of the abdomen and pelvis was performed following the standard protocol without IV contrast.  RADIATION DOSE REDUCTION: This exam was performed according to the departmental dose-optimization program which includes automated exposure control, adjustment of the mA and/or kV according to patient size and/or use of iterative reconstruction technique.  COMPARISON:  12/17/2022; 05/03/2021  FINDINGS: The lack of intravenous contrast limits the ability to evaluate solid abdominal organs.  Lower chest: Limited visualization of the lower thorax demonstrates improved aeration of the left lung base with minimal residual ill-defined ground-glass opacities at location of previously noted consolidative opacity. Minimal bibasilar reticular opacities without associated honeycombing. Note is again made of a 1.9 x 1.0 cm bolus/cyst within the medial basilar aspect of the right lower lobe. No discrete focal airspace opacities. No pleural effusion.  Normal heart size. There is diffuse decreased attenuation of the intracardiac blood pool suggestive of anemia. Coronary artery calcifications versus the sequela of previous coronary artery stenting.  Hepatobiliary: Normal hepatic contour. Normal noncontrast appearance of the gallbladder given underdistention. No radiopaque gallstones. No ascites.  Pancreas: Normal noncontrast appearance of the pancreas.  Spleen: Normal noncontrast appearance of the spleen.  Adrenals/Urinary Tract: There is an approximate 0.8 cm stone at the level of the left renal pelvis (image 23, series 2) without associated upstream left-sided pelvicaliectasis. Note is  made of a punctate (0.3 cm) nonobstructing right-sided renal stones (images 26 and 30, series 2). There is increased attenuation of several of the bilateral renal calices, potentially representative additional tiny renal stones versus nephrocalcinosis. No renal stones are seen along expected course of either ureter or the urinary bladder. There is diffuse thickening of the urinary bladder wall, potentially accentuated by underdistention. No urinary obstruction or perinephric stranding.  Normal noncontrast appearance of the bilateral adrenal glands.  Stomach/Bowel: Moderate colonic stool burden without evidence of enteric obstruction. No pneumoperitoneum, pneumatosis or portal venous gas. Evaluation of the intestines is degraded secondary to lack of IV or enteric contrast as well as lack of any significant intra abdominal fat.  Vascular/Lymphatic: Atherosclerotic plaque within a tortuous but normal caliber abdominal aorta.  No definitive bulky retroperitoneal, mesenteric, pelvic or inguinal lymphadenopathy, though evaluation degraded secondary to lack of significant intra-abdominal fat.  Reproductive: Dystrophic calcifications within a borderline enlarged prostate gland. No free fluid the pelvic cul-de-sac.  Other: Mild diffuse body wall anasarca.  Musculoskeletal: Old right iliac wing fracture with residual deformity. Mild degenerative change of the bilateral hips with joint space loss, subchondral sclerosis and osteophytosis.  IMPRESSION: 1. Redemonstrated 0.8 cm stone at the level of the left renal pelvis without associated upstream left-sided pelvicaliectasis. 2. Punctate (0.3 cm) nonobstructing right-sided renal stones.  No evidence of right-sided urinary obstruction. 3. Diffuse thickening of the urinary bladder wall, potentially accentuated by underdistention though could be seen in the setting of cystitis. Correlation with urinalysis is advised. 4. Improved aeration of  the left lung base with minimal residual ill-defined ground-glass opacities at location of previously noted consolidative opacity. 5. Diffuse decreased attenuation of the intracardiac blood pool suggestive of anemia. 6. Mild diffuse body wall anasarca. 7. Aortic Atherosclerosis (ICD10-I70.0).   Electronically Signed   By: Norleen Roulette M.D.   On: 02/14/2023 13:31  Laboratory Studies  04/06/2020-hemoglobin A1c 7.5% 08/24/2022-creatinine 1.3, GFR 55 10/31/2022-hemoglobin A1c 8.8% 12/17/2022-creatinine 1.25, GFR 57 01/26/2023-creatinine 1.54, GFR 44 02/09/2023-creatinine 1.70, GFR 40, urine microalbumin to creatinine ratio 117 03/15/2023-office urinalysis-glucose negative, bilirubin negative, ketone negative, specific gravity 1.025, blood large, pH 6.0, protein 100 mg/dL, nitrite negative, leukocyte negative. Urine microscopic exam-20-30 RBCs, nondysmorphic.  No casts.  Impression/Recommendations   Patient is a 83 y.o. 2-Black or African American male   1. Acute nontraumatic kidney injury, not otherwise specified (HCC)   2. Chronic kidney disease stage 3A (HCC)   3. Calculus of kidney   4. Chronic kidney disease due to type 2 diabetes mellitus (HCC)   5. Malnutrition, not otherwise specified (HCC)     Acute kidney injury on chronic kidney disease stage IIIa CKD risk factors include diabetic nephropathy, hypertension, kidney stones, advanced age, atherosclerosis Baseline creatinine-12/17/2022-creatinine 1.25, GFR 57 -most recent labs from 11/29/2023 show significant increase in creatinine to 2.14, GFR 30. - During his visit to oncology office on September 5 he was thought to be dehydrated and given 1 L of IV fluids. -Repeat renal panel in the next week or so.  Printed lab slip was provided.  Patient's daughter will take him whenever she is able to. -Continue atorvastatin for cardiovascular risk reduction.  Currently not on ACE inhibitor/ARB or SGLT2 inhibitor.  We will look into low-dose ACE  inhibitor if his blood pressure tends to increase in future. - Avoid SGLT2 inhibitor as he has stool incontinence and possible urinary incontinence.  He may be at high risk of perineal infections.  Acute kidney injury may be multifactorial and related to kidney stone, or Bactrim . Repeat renal panel was ordered.  Kidney stone, left kidney. Patient is status post lithotripsy.  He is followed by urologist closely. Microscopic hematuria may be related to the kidney stone.    Malnutrition Patient has alpha gal allergy. He has diarrhea off-and-on which is managed with Imodium at home Trial of mirtazapine.  Diabetes type 2 with CKD Patient has poorly controlled diabetes.  It is insulin -dependent.   Hemoglobin A1c 8.3% from July 2025.  Previous A1c was 12.3% in February 2025. Currently managed with Lantus  and Januvia . Recommended to patient to follow strict diabetic diet. He is a non-smoker.  Hypertension Blood pressure today is low normal. Discontinue   We would like to thank [name], MD  for this kind referral.  We look forward to collaborative care.  Return in about 3 months (around 03/20/2024).   Saralee Stank, MD [facility] [address] [place], [name] [address] Ph: [phone] Fax: [phone]  The following portions of the patient's chart were reviewed in this encounter and updated as appropriate:  Tobacco  Allergies  Meds  Problems  Med Hx  Surg Hx  Fam Hx       Orders Placed This Encounter  . Renal Function Panel  . mirtazapine (Remeron) 15 MG tablet

## 2023-12-25 ENCOUNTER — Encounter (INDEPENDENT_AMBULATORY_CARE_PROVIDER_SITE_OTHER): Payer: Self-pay | Admitting: Gastroenterology

## 2023-12-25 ENCOUNTER — Ambulatory Visit (INDEPENDENT_AMBULATORY_CARE_PROVIDER_SITE_OTHER): Admitting: Gastroenterology

## 2023-12-25 VITALS — BP 121/73 | HR 83 | Temp 97.5°F | Ht 64.0 in | Wt 92.9 lb

## 2023-12-25 DIAGNOSIS — D509 Iron deficiency anemia, unspecified: Secondary | ICD-10-CM | POA: Diagnosis not present

## 2023-12-25 DIAGNOSIS — Z91018 Allergy to other foods: Secondary | ICD-10-CM

## 2023-12-25 DIAGNOSIS — R197 Diarrhea, unspecified: Secondary | ICD-10-CM

## 2023-12-25 DIAGNOSIS — D500 Iron deficiency anemia secondary to blood loss (chronic): Secondary | ICD-10-CM

## 2023-12-25 DIAGNOSIS — K529 Noninfective gastroenteritis and colitis, unspecified: Secondary | ICD-10-CM

## 2023-12-25 DIAGNOSIS — R634 Abnormal weight loss: Secondary | ICD-10-CM | POA: Diagnosis not present

## 2023-12-25 NOTE — Patient Instructions (Signed)
-  Please continue to avoid beef, pork, lamb and be mindful that dairy can cause diarrhea with alpha gal as well -You can try the remeron that his kidney doctor gave him to see if this helps his appetite, as we discussed, he will benefit from getting his dentures as this will help him to eat more foods -You can try sugar free/dairy free protein shakes, boost or ensure are good, once daily to help with added nutrition -can continue imodium as needed  I would like to see you back in 3-4 months to ensure your weight is stable   It was a pleasure to see you today. I want to create trusting relationships with patients and provide genuine, compassionate, and quality care. I truly value your feedback! please be on the lookout for a survey regarding your visit with me today. I appreciate your input about our visit and your time in completing this!    Zaiyden Strozier L. Eero Dini, MSN, APRN, AGNP-C Adult-Gerontology Nurse Practitioner [facility] at [facility]

## 2023-12-25 NOTE — Progress Notes (Addendum)
 Referring Provider: [name], MD Primary Care Physician:  [name], MD Primary GI Physician: Dr. Eartha   Chief Complaint  Patient presents with   Follow-up    Patient here today for a follow up on Diarrhea and IDA. Patient says the diarrhea is much better. He denies any current symptoms of IDA. His last hgb was 10.0 and Fe Sat% was 42. Not currently taking po fe.    HPI:   Jacob Barnes is a 83 y.o. male with past medical history of arthritis, carpal tunnel, DM, ED, GERD, glaucoma, HLD, HTN, IDA, pancytopenia, hx gastric ulcers,   Patient presenting today for:  Follow up of IDA  Diarrhea due to alpha gal Loss of weight   Last seen in June 2024 by [name], at that time having 1-2 episodes of diarrhea per month, taking imodium PRN.   Recommended sugar free boost/ensure, continue imodium PRN, avoid pork, beef, lamb  Present:  Last labs on 8/21 with iron  95, TIBC 225, sat 42 hgb 10, B12, Folate WNL  Patient arrives with daughter Jacob Barnes who helps provide history. She states that he eats okay but he does not have teeth right now, supposed to be getting new dentures though his son is handling this and has not gotten this set up yet which she is concerned about. He was also given remeron for appetite stimulation by nephrology but they have not started this as they wanted to come to this visit today and discuss it. He states he does not feel super hungry but still eats. He was recently taken off of his iron  pills. States rarely having diarrhea and taking imodium as needed.   Patient denies melena, hematochezia, nausea, vomiting,, constipation, dysphagia, odyonophagia, early satiety   Last Colonoscopy:10/2023Three 3 to 6 mm polyps in the rectum, in the descending colon and in the transverse - The rest of the examined colon is normal. Biopsied. - The distal rectum and anal verge are normal on retroflexion view. 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   Anemia    Arthritis    hands   Carpal tunnel syndrome 08/05/2009   Qualifier: Diagnosis of  By: [name], [name]     Cataract 2017   Chronic kidney disease    Diabetes mellitus    says since 1979 type 2   Diabetes mellitus without complication (HCC)    Phreesia 04/03/2020   ERECTILE DYSFUNCTION, ORGANIC 01/20/2009   Qualifier: Diagnosis of  By: Antonetta MD, Margaret     GERD (gastroesophageal reflux disease)    Glaucoma    both eyes   History of kidney stones    Hyperlipemia    Hypertension    Iron  deficiency anemia 01/10/2014   Osteoporosis September 2021   Other pancytopenia (HCC) 01/10/2014   New in 01/2014, undergoing pathology review    Rash    front abdomen no drainage    Past Surgical History:  Procedure Laterality Date   AMPUTATION Right 09/01/2016   Procedure: AMPUTATION RIGHT MIDDLE FINGER TIP;  Surgeon: Jacob Lonni GRADE, MD;  Location: [facility];  Service: Orthopedics;  Laterality: Right;   AMPUTATION Right 10/10/2016  Procedure: REPEAT IRRIGATION AND DEBRIDEMENT RIGHT MIDDLE FINGER WITH AMPUTATION THROUGH PROXIMAL PHALANX;  Surgeon: Jacob Lonni GRADE, MD;  Location: [facility];  Service: Orthopedics;  Laterality: Right;   APPENDECTOMY     BIOPSY  01/13/2022   Procedure: BIOPSY;  Surgeon: Jacob Angelia [name], MD;  Location: [facility];  Service: Gastroenterology;;   COLONOSCOPY  09/06/2011   Procedure: COLONOSCOPY;  Surgeon: [name], MD;  Location: [facility];  Service: Endoscopy;  Laterality: N/A;  830   COLONOSCOPY WITH PROPOFOL  N/A 01/13/2022   Procedure: COLONOSCOPY WITH PROPOFOL ;  Surgeon: Jacob Angelia [name], MD;  Location: [facility];  Service: Gastroenterology;  Laterality:  N/A;  200 ASA 3   CYSTOSCOPY/RETROGRADE/URETEROSCOPY/STONE EXTRACTION WITH BASKET     ESOPHAGOGASTRODUODENOSCOPY N/A 01/15/2014   Procedure: ESOPHAGOGASTRODUODENOSCOPY (EGD);  Surgeon: [name], MD;  Location: [facility];  Service: Endoscopy;  Laterality: N/A;  200   ESOPHAGOGASTRODUODENOSCOPY N/A 04/22/2014   Procedure: ESOPHAGOGASTRODUODENOSCOPY (EGD);  Surgeon: [name], MD;  [facility]: [facility];  Service: Endoscopy;  Laterality: N/A;  240   EXTRACORPOREAL SHOCK WAVE LITHOTRIPSY Left 01/02/2023   Procedure: EXTRACORPOREAL SHOCK WAVE LITHOTRIPSY (ESWL);  Surgeon: Jacob Belvie CROME, MD;  Location: [facility];  Service: Urology;  Laterality: Left;   EYE SURGERY Bilateral yrs ago   ioc for cataract   IR ANGIO EXTERNAL CAROTID SEL EXT CAROTID UNI L MOD SED  03/22/2022   IR ANGIO INTRA EXTRACRAN SEL INTERNAL CAROTID UNI L MOD SED  03/26/2022   IR ANGIOGRAM FOLLOW UP STUDY  03/26/2022   IR NEURO EACH ADD'L AFTER BASIC UNI LEFT (MS)  03/26/2022   IR TRANSCATH/EMBOLIZ  03/26/2022   MALONEY DILATION N/A 01/15/2014   Procedure: AGAPITO DILATION;  Surgeon: [name], MD;  [facility];  Service: Endoscopy;  Laterality: N/A;   MALONEY DILATION  04/22/2014   Procedure: AGAPITO DILATION;  Surgeon: [name], MD;  [facility];  Service: Endoscopy;;   POLYPECTOMY  01/13/2022   Procedure: POLYPECTOMY;  Surgeon: Jacob Angelia, [name], MD;  Location: [facility];  Service: Gastroenterology;;   RADIOLOGY WITH ANESTHESIA Left 03/22/2022   Procedure: LEFT MMA EMBOLIZATION;  Surgeon: Jacob Pupa, MD;  Location: [facility];  Service: Radiology;  Laterality: Left;   surgery for left eye bleedf  2017    Current Outpatient Medications  Medication Sig Dispense Refill   atorvastatin (LIPITOR) 10 MG tablet GIVE 1 TABLET BY MOUTH AT BEDTIME EVERY MONDAY/WEDNESDAY/FRIDAY FOR HIGH CHOLESTEROL 40 tablet 2   blood glucose meter kit and supplies Dispense based on  patient and insurance preference. Use up to two times daily as directed. (FOR ICD-10 E10.9, E11.9). 1 each 0   CALCIUM -VITAMIN D  PO Take by mouth. (Patient taking differently: Take by mouth daily at 6 (six) AM.)     Continuous Glucose Sensor (FREESTYLE LIBRE 2 SENSOR) MISC Change sensor every 14 days 6 each 3   donepezil  (ARICEPT ) 10 MG tablet TAKE 1 TABLET BY MOUTH EVERYDAY AT BEDTIME 90 tablet 3   dorzolamide -timolol  (COSOPT ) 2-0.5 % ophthalmic solution 1 drop 2 (two) times daily.     Insulin  Degludec (TRESIBA  [place]) Inject into the skin every other day.     Insulin  Pen Needle (PEN NEEDLES) 31G X 6 MM MISC 1 each by Does not apply route at bedtime. 100 each 2   latanoprost  (XALATAN ) 0.005 % ophthalmic solution 1 drop at bedtime.     levETIRAcetam  (KEPPRA ) 500 MG tablet TAKE 1  TABLET BY MOUTH TWICE A DAY 180 tablet 1   Potassium 99 MG TABS Take 99 mg by mouth daily.     sitaGLIPtin  (JANUVIA ) 100 MG tablet Take 1 tablet (100 mg total) by mouth daily. 90 tablet 1   tamsulosin  (FLOMAX ) 0.4 MG CAPS capsule TAKE 1 CAPSULE BY MOUTH EVERY DAY AFTER SUPPER 90 capsule 1   copper  tablet Take 1 tablet (2 mg total) by mouth daily. (Patient not taking: Reported on 12/25/2023) 30 tablet 3   ferrous sulfate  325 (65 FE) MG tablet Take 325 mg by mouth every other day. (Patient not taking: Reported on 12/25/2023)     insulin  glargine (LANTUS ) 100 UNIT/ML injection Inject 0.15 mLs (15 Units total) into the skin at bedtime. (Patient not taking: Reported on 12/25/2023) 15 mL 0   Vitamin D , Ergocalciferol , (DRISDOL ) 1.25 MG (50000 UNIT) CAPS capsule Take 1 capsule (50,000 Units total) by mouth every 7 (seven) days. 5 capsule 8   No current facility-administered medications for this visit.    Allergies as of 12/25/2023 - Review Complete 12/25/2023  Allergen Reaction Noted   Alpha-gal  03/13/2022    Social History   Socioeconomic History   Marital status: Married    Spouse name: Not on file   Number of children:  Not on file   Years of education: Not on file   Highest education level: Not on file  Occupational History   Not on file  Tobacco Use   Smoking status: Never    Passive exposure: Never   Smokeless tobacco: Never  Vaping Use   Vaping status: Never Used  Substance and Sexual Activity   Alcohol use: No   Drug use: No   Sexual activity: Yes  Other Topics Concern   Not on file  Social History Narrative   Not on file   Social Drivers of Health   Financial Resource Strain: High Risk (10/15/2023)   Overall Financial Resource Strain (CARDIA)    Difficulty of Paying Living Expenses: Very hard  Food Insecurity: Food Insecurity Present (10/15/2023)   Hunger Vital Sign    Worried About Running Out of Food in the Last Year: Often true    Ran Out of Food in the Last Year: Often true  Transportation Needs: No Transportation Needs (07/18/2023)   PRAPARE - Administrator, Civil Service (Medical): No    Lack of Transportation (Non-Medical): No  Physical Activity: Sufficiently Active (07/13/2022)   Exercise Vital Sign    Days of Exercise per Week: 4 days    Minutes of Exercise per Session: 60 min  Stress: Stress Concern Present (10/15/2023)   [employer] of Occupational Health - Occupational Stress Questionnaire    Feeling of Stress: Rather much  Social Connections: Socially Integrated (07/13/2022)   Social Connection and Isolation Panel    Frequency of Communication with Friends and Family: More than three times a week    Frequency of Social Gatherings with Friends and Family: Once a week    Attends Religious Services: More than 4 times per year    Active Member of [employer] or Organizations: No    Attends Engineer, structural: 1 to 4 times per year    Marital Status: Married    Review of systems General: negative for malaise, night sweats, fever, chills +weight loss  Neck: Negative for lumps, goiter, pain and significant neck swelling Resp: Negative for cough, wheezing,  dyspnea at rest CV: Negative for chest pain, leg swelling, palpitations, orthopnea GI:  denies melena, hematochezia, nausea, vomiting, constipation, dysphagia, odyonophagia, early satiety +diarrhea +weight loss  MSK: Negative for joint pain or swelling, back pain, and muscle pain. Derm: Negative for itching or rash Psych: Denies depression, anxiety, memory loss, confusion. No homicidal or suicidal ideation.  Heme: Negative for prolonged bleeding, bruising easily, and swollen nodes. Endocrine: Negative for cold or heat intolerance, polyuria, polydipsia and goiter. Neuro: negative for tremor, gait imbalance, syncope and seizures. The remainder of the review of systems is noncontributory.  Physical Exam: BP 121/73 (BP Location: Left Arm, Patient Position: Sitting, Cuff Size: Large)   Pulse 83   Temp (!) 97.5 F (36.4 C) (Temporal)   Ht 5' 4 (1.626 m)   Wt 92 lb 14.4 oz (42.1 kg)   BMI 15.95 kg/m  General:   Alert and oriented. No distress noted. Pleasant and cooperative.  Head:  Normocephalic and atraumatic. Eyes:  Conjuctiva clear without scleral icterus. Mouth:  Oral mucosa pink and moist. Good dentition. No lesions. Heart: Normal rate and rhythm, s1 and s2 heart sounds present.  Lungs: Clear lung sounds in all lobes. Respirations equal and unlabored. Abdomen:  +BS, soft, non-tender and non-distended. No rebound or guarding. No HSM or masses noted. Derm: No palmar erythema or jaundice Msk:  Symmetrical without gross deformities. Normal posture. Extremities:  Without edema. Neurologic:  Alert, pleasantly confused  Psych:  Alert and cooperative. Normal mood and affect.  Invalid input(s): 6 MONTHS   ASSESSMENT: Jacob Barnes is a 83 y.o. male presenting today for follow up of IDA, diarrhea due to alpha gal and loss of weight  IDA: following with hematology, recent Iron  studies actually with elevated Iron , his PO iron  pills were stopped. No rectal bleeding or melena. Can continue  with iron  rich diet   Diarrhea secondary to alpha gal: mostly well controlled, only having issues on occasion, taking imodium PRN. Reiterated importance of alpha gal diet--avoiding beef, lamb, pork and dairy.   Loss of weight: likely multifactorial in setting of dementia, advanced age, edentulous status as weight decline began around the time he lost his teeth. Daughter states he will eat but is limited due to not having teeth. Nephrology gave him remeron which they have not started yet but I advised they could try this, we discussed side effects to be mindful of and I recommended 1 sugar free/dairy free protein shake per day to help with added nutrition.    PLAN:  -continue to follow with hematology -sugar/dairy free protein shake per day -work on getting dentures to help with eating -can start remeron prescribed by nephrology, made aware of side effects to be mindful of -iron  rich diet -avoid beef, pork, lamb, be mindful of dairy  All questions were answered, patient verbalized understanding and is in agreement with plan as outlined above.   Follow Up: 3-4 months for to check weight   Alizey Noren L. Mariette, MSN, APRN, AGNP-C Adult-Gerontology Nurse Practitioner [facility]   I have reviewed the note and agree with the APP's assessment as described in this progress note  Toribio Fortune, MD Gastroenterology and Hepatology [facility]

## 2023-12-27 ENCOUNTER — Ambulatory Visit: Admitting: Nutrition

## 2024-01-17 ENCOUNTER — Other Ambulatory Visit: Payer: Self-pay | Admitting: Nurse Practitioner

## 2024-01-17 ENCOUNTER — Other Ambulatory Visit: Payer: Self-pay | Admitting: *Deleted

## 2024-01-17 DIAGNOSIS — E1165 Type 2 diabetes mellitus with hyperglycemia: Secondary | ICD-10-CM

## 2024-01-17 DIAGNOSIS — E1159 Type 2 diabetes mellitus with other circulatory complications: Secondary | ICD-10-CM

## 2024-01-17 DIAGNOSIS — E118 Type 2 diabetes mellitus with unspecified complications: Secondary | ICD-10-CM

## 2024-01-20 ENCOUNTER — Other Ambulatory Visit: Payer: Self-pay | Admitting: Family Medicine

## 2024-01-23 ENCOUNTER — Encounter (INDEPENDENT_AMBULATORY_CARE_PROVIDER_SITE_OTHER): Payer: Self-pay | Admitting: Gastroenterology

## 2024-01-29 ENCOUNTER — Ambulatory Visit: Admitting: Nutrition

## 2024-02-02 ENCOUNTER — Other Ambulatory Visit: Payer: Self-pay | Admitting: Urology

## 2024-02-05 ENCOUNTER — Telehealth: Payer: Self-pay

## 2024-02-05 ENCOUNTER — Ambulatory Visit (INDEPENDENT_AMBULATORY_CARE_PROVIDER_SITE_OTHER)

## 2024-02-05 VITALS — Ht 64.0 in | Wt 92.0 lb

## 2024-02-05 DIAGNOSIS — Z0000 Encounter for general adult medical examination without abnormal findings: Secondary | ICD-10-CM

## 2024-02-05 NOTE — Patient Instructions (Signed)
 Jacob Barnes,  Thank you for taking the time for your Medicare Wellness Visit. I appreciate your continued commitment to your health goals. Please review the care plan we discussed, and feel free to reach out if I can assist you further.  Medicare recommends these wellness visits once per year to help you and your care team stay ahead of potential health issues. These visits are designed to focus on prevention, allowing your provider to concentrate on managing your acute and chronic conditions during your regular appointments.  Please note that Annual Wellness Visits do not include a physical exam. Some assessments may be limited, especially if the visit was conducted virtually. If needed, we may recommend a separate in-person follow-up with your provider.  Ongoing Care  Seeing your primary care provider every 3 to 6 months helps us  monitor your health and provide consistent, personalized care.   Referrals   Please call [facility] and see what resources you may qualify for [facility] Phone: [phone] Ask them if they know of a similar program in your area in [place]  Call DSS and explain to them the situation and see if they can offer any recommendations  Recommended Screenings:  Health Maintenance  Topic Date Due   Zoster (Shingles) Vaccine (1 of 2) Never done   DTaP/Tdap/Td vaccine (2 - Tdap) 12/22/2018   Eye exam for diabetics  01/18/2023   Flu Shot  11/09/2023   COVID-19 Vaccine (4 - 2025-26 season) 12/10/2023   Yearly kidney health urinalysis for diabetes  02/09/2024   Complete foot exam   02/11/2024   Hemoglobin A1C  04/16/2024   Yearly kidney function blood test for diabetes  11/28/2024   Medicare Annual Wellness Visit  02/04/2025   Pneumococcal Vaccine for age over 51  Completed   Meningitis B Vaccine  Aged Out       02/05/2024    3:10 PM  Advanced Directives  Does Patient Have a Medical Advance Directive? No  Would patient like information on creating a  medical advance directive? No - Patient declined    Advance Care Planning is important because it: Ensures you receive medical care that aligns with your values, goals, and preferences. Provides guidance to your family and loved ones, reducing the emotional burden of decision-making during critical moments.  Vision: Annual vision screenings are recommended for early detection of glaucoma, cataracts, and diabetic retinopathy. These exams can also reveal signs of chronic conditions such as diabetes and high blood pressure.  Dental: Annual dental screenings help detect early signs of oral cancer, gum disease, and other conditions linked to overall health, including heart disease and diabetes.  Please see the attached documents for additional preventive care recommendations.

## 2024-02-05 NOTE — Telephone Encounter (Signed)
 Copied from CRM [id number]. Topic: Appointments - Scheduling Inquiry for Clinic >> Feb 05, 2024 12:11 PM Antony RAMAN wrote: Reason for CRM: patients daughter tangela called upset about her father the pt missing the telephone appointment today. She said she thought the appt was at 7:45am and waited, she stated there was not a voicemail left at 8:19am. Please give her a callback at [phone]

## 2024-02-05 NOTE — Progress Notes (Signed)
 Please attest and cosign this visit due to patients primary care provider not being immediately available at the time the visit was completed.   Subjective:   Jacob Barnes is a 83 y.o. who presents for a Medicare Wellness preventive visit. As a reminder, Annual Wellness Visits don't include a physical exam, and some assessments may be limited, especially if this visit is performed virtually. We may recommend an in-person follow-up visit with your provider if needed.  Visit Complete: Virtual I connected with  Jacob Barnes on 02/05/24 by a audio enabled telemedicine application and verified that I am speaking with the correct person using two identifiers.  Patient Location: Home  Provider Location: Office/Clinic  I discussed the limitations of evaluation and management by telemedicine. The patient expressed understanding and agreed to proceed.  Vital Signs: Because this visit was a virtual/telehealth visit, some criteria may be missing or patient reported. Any vitals not documented were not able to be obtained and vitals that have been documented are patient reported. VideoDeclined- This patient declined Librarian, academic. Therefore the visit was completed with audio only.  Persons Participating in Visit: Patient assisted by [name] (daugther).  AWV Questionnaire: No: Patient Medicare AWV questionnaire was not completed prior to this visit. Cardiac Risk Factors include: advanced age (>84men, >43 women);diabetes mellitus;dyslipidemia;hypertension;male gender;sedentary lifestyle     Objective:    Today's Vitals   02/05/24 1509  Weight: 92 lb (41.7 kg)  Height: 5' 4 (1.626 m)   Body mass index is 15.79 kg/m.    02/05/2024    3:10 PM 12/14/2023    9:00 AM 09/07/2023    8:09 AM 08/03/2023    8:55 AM 07/27/2023    9:04 AM 07/18/2023    8:27 AM 12/17/2022   11:32 AM  Advanced Directives  Does Patient Have a Medical Advance Directive? No No No No No No No  Would  patient like information on creating a medical advance directive? No - Patient declined Yes (Inpatient - patient requests chaplain consult to create a medical advance directive) No - Patient declined No - Patient declined No - Patient declined No - Patient declined No - Patient declined    Current Medications (verified) Outpatient Encounter Medications as of 02/05/2024  Medication Sig   atorvastatin (LIPITOR) 10 MG tablet GIVE 1 TABLET BY MOUTH AT BEDTIME EVERY MONDAY/WEDNESDAY/FRIDAY FOR HIGH CHOLESTEROL   blood glucose meter kit and supplies Dispense based on patient and insurance preference. Use up to two times daily as directed. (FOR ICD-10 E10.9, E11.9).   CALCIUM -VITAMIN D  PO Take by mouth. (Patient taking differently: Take by mouth daily at 6 (six) AM.)   Continuous Glucose Sensor (FREESTYLE LIBRE 2 SENSOR) MISC Change sensor every 14 days   copper  tablet Take 1 tablet (2 mg total) by mouth daily.   donepezil  (ARICEPT ) 10 MG tablet TAKE 1 TABLET BY MOUTH EVERYDAY AT BEDTIME   dorzolamide -timolol  (COSOPT ) 2-0.5 % ophthalmic solution 1 drop 2 (two) times daily.   Insulin  Degludec (TRESIBA  [place]) Inject into the skin every other day.   insulin  glargine (LANTUS  SOLOSTAR) 100 UNIT/ML Solostar Pen INJECT 0.15 MLS (15 UNITS TOTAL) INTO THE SKIN AT BEDTIME.   Insulin  Pen Needle (PEN NEEDLES) 31G X 6 MM MISC 1 each by Does not apply route at bedtime.   latanoprost  (XALATAN ) 0.005 % ophthalmic solution 1 drop at bedtime.   levETIRAcetam  (KEPPRA ) 500 MG tablet TAKE 1 TABLET BY MOUTH TWICE A DAY   Potassium 99 MG TABS  Take 99 mg by mouth daily.   sitaGLIPtin  (JANUVIA ) 100 MG tablet Take 1 tablet (100 mg total) by mouth daily.   tamsulosin  (FLOMAX ) 0.4 MG CAPS capsule TAKE 1 CAPSULE BY MOUTH EVERY DAY AFTER SUPPER   Vitamin D , Ergocalciferol , (DRISDOL ) 1.25 MG (50000 UNIT) CAPS capsule Take 1 capsule (50,000 Units total) by mouth every 7 (seven) days.   No facility-administered encounter medications on  file as of 02/05/2024.    Allergies (verified) Alpha-gal   History: Past Medical History:  Diagnosis Date   Abrasion of right middle finger with infection    for 1 week,    Allergy 2023   Amputation of right middle finger 10/10/2016   Anemia    Arthritis    hands   Carpal tunnel syndrome 08/05/2009   Qualifier: Diagnosis of  By: [name], [name]     Cataract 2017   Chronic kidney disease    Diabetes mellitus    says since 1979 type 2   Diabetes mellitus without complication (HCC)    [employer]rees[employer] 04/03/2020   ERECTILE DYSFUNCTION, ORGANIC 01/20/2009   Qualifier: Diagnosis of  By: Antonetta MD, Margaret     GERD (gastroesophageal reflux disease)    Glaucoma    both eyes   History of kidney stones    Hyperlipemia    Hypertension    Iron  deficiency anemia 01/10/2014   Osteoporosis September 2021   Other pancytopenia (HCC) 01/10/2014   New in 01/2014, undergoing pathology review    Rash    front abdomen no drainage   Past Surgical History:  Procedure Laterality Date   AMPUTATION Right 09/01/2016   Procedure: AMPUTATION RIGHT MIDDLE FINGER TIP;  Surgeon: [name], MD;  Location: [facility];  Service: Orthopedics;  Laterality: Right;   AMPUTATION Right 10/10/2016   Procedure: REPEAT IRRIGATION AND DEBRIDEMENT RIGHT MIDDLE FINGER WITH AMPUTATION THROUGH PROXIMAL PHALANX;  Surgeon: [name], MD;  Location: [facility];  Service: Orthopedics;  Laterality: Right;   APPENDECTOMY     BIOPSY  01/13/2022   Procedure: BIOPSY;  Surgeon: [name], MD;  Location: [facility];  Service: Gastroenterology;;   COLONOSCOPY  09/06/2011   Procedure: COLONOSCOPY;  Surgeon: [name], MD;  Location: [facility];  Service: Endoscopy;  Laterality: N/A;  830   COLONOSCOPY WITH PROPOFOL  N/A 01/13/2022   Procedure: COLONOSCOPY WITH PROPOFOL ;  Surgeon: [name], MD;  Location: [facility];  Service: Gastroenterology;  Laterality: N/A;  200  ASA 3   CYSTOSCOPY/RETROGRADE/URETEROSCOPY/STONE EXTRACTION WITH BASKET     ESOPHAGOGASTRODUODENOSCOPY N/A 01/15/2014   Procedure: ESOPHAGOGASTRODUODENOSCOPY (EGD);  Surgeon: [name], MD;  Location: [facility];  Service: Endoscopy;  Laterality: N/A;  200   ESOPHAGOGASTRODUODENOSCOPY N/A 04/22/2014   Procedure: ESOPHAGOGASTRODUODENOSCOPY (EGD);  Surgeon: [name], MD;  [facility]: [facility];  Service: Endoscopy;  Laterality: N/A;  240   EXTRACORPOREAL SHOCK WAVE LITHOTRIPSY Left 01/02/2023   Procedure: EXTRACORPOREAL SHOCK WAVE LITHOTRIPSY (ESWL);  Surgeon: [name], MD;  Location: [facility];  Service: Urology;  Laterality: Left;   EYE SURGERY Bilateral yrs ago   ioc for cataract   IR ANGIO EXTERNAL CAROTID SEL EXT CAROTID UNI L MOD SED  03/22/2022   IR ANGIO INTRA EXTRACRAN SEL INTERNAL CAROTID UNI L MOD SED  03/26/2022   IR ANGIOGRAM FOLLOW UP STUDY  03/26/2022   IR NEURO EACH ADD'L AFTER BASIC UNI LEFT (MS)  03/26/2022   IR TRANSCATH/EMBOLIZ  03/26/2022  MALONEY DILATION N/A 01/15/2014   Procedure: MALONEY DILATION;  Surgeon: [name], MD;  [facility];  Service: Endoscopy;  Laterality: N/A;   MALONEY DILATION  04/22/2014   Procedure: AGAPITO DILATION;  Surgeon: [name], MD;  [facility];  Service: Endoscopy;;   POLYPECTOMY  01/13/2022   Procedure: POLYPECTOMY;  Surgeon: Eartha Flavors, Toribio, MD;  Location: [facility];  Service: Gastroenterology;;   RADIOLOGY WITH ANESTHESIA Left 03/22/2022   Procedure: LEFT MMA EMBOLIZATION;  Surgeon: Lanis Pupa, MD;  Location: [facility];  Service: Radiology;  Laterality: Left;   surgery for left eye bleedf  2017   Family History  Problem Relation Age of Onset   Diabetes Mother    Hypertension Mother    Hyperlipidemia Mother    Stroke Mother    Heart failure Mother    Diabetes Father    Heart disease Father    Hypertension Father    Hyperlipidemia Father    Heart attack  Father    Diabetes Brother    Diabetes Brother    Diabetes Son    Social History   Socioeconomic History   Marital status: Married    Spouse name: Not on file   Number of children: Not on file   Years of education: Not on file   Highest education level: Not on file  Occupational History   Not on file  Tobacco Use   Smoking status: Never    Passive exposure: Never   Smokeless tobacco: Never  Vaping Use   Vaping status: Never Used  Substance and Sexual Activity   Alcohol use: No   Drug use: No   Sexual activity: Yes  Other Topics Concern   Not on file  Social History Narrative   Not on file   Social Drivers of Health   Financial Resource Strain: Patient Declined (02/05/2024)   Overall Financial Resource Strain (CARDIA)    Difficulty of Paying Living Expenses: Patient declined  Food Insecurity: Food Insecurity Present (02/05/2024)   Hunger Vital Sign    Worried About Running Out of Food in the Last Year: Often true    Ran Out of Food in the Last Year: Often true  Transportation Needs: No Transportation Needs (02/05/2024)   PRAPARE - Administrator, Civil Service (Medical): No    Lack of Transportation (Non-Medical): No  Physical Activity: Inactive (02/05/2024)   Exercise Vital Sign    Days of Exercise per Week: 0 days    Minutes of Exercise per Session: 0 min  Stress: No Stress Concern Present (02/05/2024)   [employer] of Occupational Health - Occupational Stress Questionnaire    Feeling of Stress: Not at all  Social Connections: Moderately Integrated (02/05/2024)   Social Connection and Isolation Panel    Frequency of Communication with Friends and Family: More than three times a week    Frequency of Social Gatherings with Friends and Family: Once a week    Attends Religious Services: More than 4 times per year    Active Member of [employer] or Organizations: No    Attends Engineer, Structural: Never    Marital Status: Married    Tobacco  Counseling Counseling given: Yes   Clinical Intake: Pre-visit preparation completed: Yes Pain : No/denies pain   BMI - recorded: 15.79 Nutritional Status: BMI <19  Underweight Nutritional Risks: None Diabetes: Yes CBG done?: No (telehealth visit.) Did pt. bring in CBG monitor from home?: No Lab Results  Component Value Date   HGBA1C 8.3 (A) 10/15/2023   HGBA1C 12.3 (A) 06/04/2023   HGBA1C 8.8 (A) 10/31/2022    How often do you need to have someone help you when you read instructions, pamphlets, or other written materials from your doctor or pharmacy?: 1 - Never Interpreter Needed?: No Information entered by :: Wanette Robison W CMA (AAMA)  Activities of Daily Living     02/05/2024    3:54 PM  In your present state of health, do you have any difficulty performing the following activities:  Hearing? 0  Vision? 0  Difficulty concentrating or making decisions? 1  Comment dementia  Walking or climbing stairs? 1  Dressing or bathing? 1  Doing errands, shopping? 1  Preparing Food and eating ? Y  Using the Toilet? N  In the past six months, have you accidently leaked urine? N  Do you have problems with loss of bowel control? N  Managing your Medications? Y  Managing your Finances? Y  Housekeeping or managing your Housekeeping? Y   Patient Care Team: [name], MD as PCP - General Alvia Norleen BIRCH, MD as Consulting Physician (Ophthalmology) Dennise Capri, MD as Consulting Physician (Nephrology)  I have updated your Care Teams any recent Medical Services you may have received from other providers in the past year.     Assessment:   This is a routine wellness examination for Jacob Barnes.  Hearing/Vision screen Hearing Screening - Comments:: Patient denies any hearing difficulties.   Vision Screening - Comments:: Wears rx glasses - up to date with routine eye exams   Goals Addressed             This Visit's Progress    remain active and healthy          Depression  Screen     02/05/2024    3:26 PM 12/14/2023    8:51 AM 11/13/2023    4:03 PM 07/18/2023    8:30 AM 07/13/2023    4:24 PM 02/09/2023    9:00 AM 08/24/2022    9:03 AM  PHQ 2/9 Scores  PHQ - 2 Score 0 0 0 0 0 1 0  PHQ- 9 Score 0     2      Fall Risk     02/05/2024    3:15 PM 11/13/2023    4:02 PM 10/15/2023    3:44 PM 07/13/2023    4:23 PM 03/14/2023    8:55 AM  Fall Risk   Falls in the past year? 1 0 0 0 0  Number falls in past yr: 0 0 0 0 0  Injury with Fall? 0 0  0 0  Risk for fall due to : Impaired balance/gait;Impaired mobility  Impaired balance/gait;Impaired mobility  No Fall Risks  Follow up Falls evaluation completed;Education provided;Falls prevention discussed Falls evaluation completed   Falls evaluation completed    MEDICARE RISK AT HOME:  Medicare Risk at Home Any stairs in or around the home?: No If so, are there any without handrails?: No Home free of loose throw rugs in walkways, pet beds, electrical cords, etc?: Yes Adequate lighting in your home to reduce risk of falls?: Yes Life alert?: No Use of a cane, walker or w/c?: Yes Grab bars in the bathroom?: No Shower chair or bench in shower?: Yes Elevated toilet seat or a handicapped toilet?: No  TIMED UP AND GO: Was the test performed?  No  Cognitive Function: Unable: Due to language barrier, hearing or vision limitations  or otherdiagnosis of dementia    01/20/2021    3:30 PM 10/15/2018   11:54 AM  MMSE - Mini Mental State Exam  Orientation to time 4 5  Orientation to Place 3 5  Registration 3 3  Attention/ Calculation 2 5  Recall 0 3  Language- name 2 objects 2 2  Language- repeat 1 1  Language- follow 3 step command 3 3  Language- read & follow direction 1 1  Write a sentence 1 1  Copy design 0 0  Total score 20 29        07/13/2022    9:51 AM 07/04/2021   10:39 AM 02/11/2020   11:47 AM 02/10/2019   12:04 PM 01/21/2018    1:42 PM  6CIT Screen  What Year? 4 points 0 points 0 points 0 points 0 points   What month? 0 points 0 points 0 points 0 points 0 points  What time? 0 points 0 points 0 points 0 points 0 points  Count back from 20 0 points 0 points 4 points 0 points 0 points  Months in reverse 0 points 0 points 4 points 0 points 0 points  Repeat phrase 10 points 6 points 2 points 0 points 4 points  Total Score 14 points 6 points 10 points 0 points 4 points    Immunizations Immunization History  Administered Date(s) Administered   Fluad Quad(high Dose 65+) 02/27/2019, 01/20/2021, 02/16/2022   Fluad Trivalent(High Dose 65+) 02/09/2023   Influenza,inj,Quad PF,6+ Mos 03/17/2013, 03/17/2014, 03/18/2015, 01/04/2016, 12/05/2016, 12/02/2019   Influenza-Unspecified 01/17/2017   Moderna Sars-Covid-2 Vaccination 06/23/2019, 07/21/2019, 02/12/2020   PPD Test 08/16/2010, 08/18/2010, 11/03/2013   Pneumococcal Conjugate-13 11/03/2013   Pneumococcal Polysaccharide-23 12/21/2008   Td 12/21/2008    Screening Tests Health Maintenance  Topic Date Due   Zoster Vaccines- Shingrix (1 of 2) Never done   DTaP/Tdap/Td (2 - Tdap) 12/22/2018   OPHTHALMOLOGY EXAM  01/18/2023   Influenza Vaccine  11/09/2023   COVID-19 Vaccine (4 - 2025-26 season) 12/10/2023   Diabetic kidney evaluation - Urine ACR  02/09/2024   FOOT EXAM  02/11/2024   HEMOGLOBIN A1C  04/16/2024   Diabetic kidney evaluation - eGFR measurement  11/28/2024   Medicare Annual Wellness (AWV)  02/04/2025   Pneumococcal Vaccine: 50+ Years  Completed   Meningococcal B Vaccine  Aged Out    Health Maintenance Health Maintenance Due  Topic Date Due   Zoster Vaccines- Shingrix (1 of 2) Never done   DTaP/Tdap/Td (2 - Tdap) 12/22/2018   OPHTHALMOLOGY EXAM  01/18/2023   Influenza Vaccine  11/09/2023   COVID-19 Vaccine (4 - 2025-26 season) 12/10/2023   Diabetic kidney evaluation - Urine ACR  02/09/2024   Health Maintenance Items Addressed: Daughter is aware of screenings due and recommended vaccines  Additional Screening: Vision  Screening: Recommended annual ophthalmology exams for early detection of glaucoma and other disorders of the eye. Would you like a referral to an eye doctor? No    Dental Screening: Recommended annual dental exams for proper oral hygiene  Community Resource Referral / Chronic Care Management: CRR required this visit?  Patient provided with resources  CCM required this visit?  No  Plan:   I have personally reviewed and noted the following in the patient's chart:   Medical and social history Use of alcohol, tobacco or illicit drugs  Current medications and supplements including opioid prescriptions. Patient is not currently taking opioid prescriptions. Functional ability and status Nutritional status Physical activity Advanced directives List of  other physicians Hospitalizations, surgeries, and ER visits in previous 12 months Vitals Screenings to include cognitive, depression, and falls Referrals and appointments  In addition, I have reviewed and discussed with patient certain preventive protocols, quality metrics, and best practice recommendations. A written personalized care plan for preventive services as well as general preventive health recommendations were provided to patient.   Arnell Slivinski, CMA   02/05/2024   After Visit Summary: (MyChart) Due to this being a telephonic visit, the after visit summary with patients personalized plan was offered to patient via MyChart   Notes: Nothing significant to report at this time.

## 2024-02-06 ENCOUNTER — Ambulatory Visit: Admitting: Nutrition

## 2024-02-11 ENCOUNTER — Encounter: Payer: Self-pay | Admitting: Radiology

## 2024-02-11 ENCOUNTER — Ambulatory Visit

## 2024-02-11 NOTE — Telephone Encounter (Signed)
 Visit completed.

## 2024-02-15 ENCOUNTER — Inpatient Hospital Stay: Attending: Hematology

## 2024-02-15 ENCOUNTER — Inpatient Hospital Stay

## 2024-02-15 DIAGNOSIS — D500 Iron deficiency anemia secondary to blood loss (chronic): Secondary | ICD-10-CM

## 2024-02-15 LAB — COMPREHENSIVE METABOLIC PANEL WITH GFR
ALT: 28 U/L (ref 0–44)
AST: 30 U/L (ref 15–41)
Albumin: 4.3 g/dL (ref 3.5–5.0)
Alkaline Phosphatase: 63 U/L (ref 38–126)
Anion gap: 10 (ref 5–15)
BUN: 31 mg/dL — ABNORMAL HIGH (ref 8–23)
CO2: 23 mmol/L (ref 22–32)
Calcium: 9 mg/dL (ref 8.9–10.3)
Chloride: 105 mmol/L (ref 98–111)
Creatinine, Ser: 1.94 mg/dL — ABNORMAL HIGH (ref 0.61–1.24)
GFR, Estimated: 34 mL/min — ABNORMAL LOW (ref >60)
Glucose, Bld: 176 mg/dL — ABNORMAL HIGH (ref 70–99)
Potassium: 4.5 mmol/L (ref 3.5–5.1)
Sodium: 137 mmol/L (ref 135–145)
Total Bilirubin: 0.6 mg/dL (ref 0.0–1.2)
Total Protein: 9.3 g/dL — ABNORMAL HIGH (ref 6.5–8.1)

## 2024-02-15 LAB — CBC WITH DIFFERENTIAL/PLATELET
Abs Immature Granulocytes: 0.09 K/uL — ABNORMAL HIGH (ref 0.00–0.07)
Basophils Absolute: 0 K/uL (ref 0.0–0.1)
Basophils Relative: 0 %
Eosinophils Absolute: 0 K/uL (ref 0.0–0.5)
Eosinophils Relative: 0 %
HCT: 31.1 % — ABNORMAL LOW (ref 39.0–52.0)
Hemoglobin: 9.8 g/dL — ABNORMAL LOW (ref 13.0–17.0)
Immature Granulocytes: 2 %
Lymphocytes Relative: 33 %
Lymphs Abs: 1.7 K/uL (ref 0.7–4.0)
MCH: 29.5 pg (ref 26.0–34.0)
MCHC: 31.5 g/dL (ref 30.0–36.0)
MCV: 93.7 fL (ref 80.0–100.0)
Monocytes Absolute: 0.9 K/uL (ref 0.1–1.0)
Monocytes Relative: 18 %
Neutro Abs: 2.4 K/uL (ref 1.7–7.7)
Neutrophils Relative %: 47 %
Platelets: 131 K/uL — ABNORMAL LOW (ref 150–400)
RBC: 3.32 MIL/uL — ABNORMAL LOW (ref 4.22–5.81)
RDW: 15 % (ref 11.5–15.5)
WBC: 5.1 K/uL (ref 4.0–10.5)
nRBC: 0 % (ref 0.0–0.2)

## 2024-02-15 LAB — IRON AND TIBC
Iron: 121 ug/dL (ref 45–182)
Saturation Ratios: 56 % — ABNORMAL HIGH (ref 17.9–39.5)
TIBC: 216 ug/dL — ABNORMAL LOW (ref 250–450)
UIBC: 95 ug/dL

## 2024-02-15 LAB — VITAMIN B12: Vitamin B-12: 1308 pg/mL — ABNORMAL HIGH (ref 180–914)

## 2024-02-15 LAB — FOLATE: Folate: 6.5 ng/mL (ref >5.9)

## 2024-02-15 LAB — FERRITIN: Ferritin: 586 ng/mL — ABNORMAL HIGH (ref 24–336)

## 2024-02-17 LAB — COPPER, SERUM: Copper: 72 ug/dL (ref 69–132)

## 2024-02-18 LAB — KAPPA/LAMBDA LIGHT CHAINS
Kappa free light chain: 100 mg/L — ABNORMAL HIGH (ref 3.3–19.4)
Kappa, lambda light chain ratio: 1.56 (ref 0.26–1.65)
Lambda free light chains: 64.2 mg/L — ABNORMAL HIGH (ref 5.7–26.3)

## 2024-02-19 ENCOUNTER — Ambulatory Visit: Admitting: Nurse Practitioner

## 2024-02-21 LAB — MULTIPLE MYELOMA PANEL, SERUM
Albumin SerPl Elph-Mcnc: 3.8 g/dL (ref 2.9–4.4)
Albumin/Glob SerPl: 0.8 (ref 0.7–1.7)
Alpha 1: 0.2 g/dL (ref 0.0–0.4)
Alpha2 Glob SerPl Elph-Mcnc: 0.5 g/dL (ref 0.4–1.0)
B-Globulin SerPl Elph-Mcnc: 1.1 g/dL (ref 0.7–1.3)
Gamma Glob SerPl Elph-Mcnc: 3.1 g/dL — ABNORMAL HIGH (ref 0.4–1.8)
Globulin, Total: 4.9 g/dL — ABNORMAL HIGH (ref 2.2–3.9)
IgA: 905 mg/dL — ABNORMAL HIGH (ref 61–437)
IgG (Immunoglobin G), Serum: 3329 mg/dL — ABNORMAL HIGH (ref 603–1613)
IgM (Immunoglobulin M), Srm: 72 mg/dL (ref 15–143)
Total Protein ELP: 8.7 g/dL — ABNORMAL HIGH (ref 6.0–8.5)

## 2024-02-21 NOTE — Progress Notes (Signed)
 Jacob Barnes at [facility]  HEMATOLOGY FOLLOW-UP VISIT  Jacob Rollene BRAVO, MD  REASON FOR FOLLOW-UP: Normocytic anemia  ASSESSMENT & PLAN:  Patient is a 83 y.o. male following for normocytic anemia  Normocytic anemia Patient has normocytic anemia that is not resolved with the iron  supplementation Multiple myeloma workup: SPEP: No M spike, IFE: Polyclonal increase Slightly elevated free light chains with normal ratio Elevated IgG and IgA Autoimmune workup: Negative History of CKD secondary to diabetes with worsening creatinine.   - We reviewed the labs today drawn on 02/15/2024: CMP: Creatinine: 1.94-improved from prior, normal LFTs, normal calcium .  CBC: Hemoglobin: 9.8, hematocrit: 31.1, MCV: 93.7, platelets: 131.  SPEP: M spike: Not observed, kappa free light chain: 100, lambda free light chain: 64.2, ratio: 1.56 - Iron : 121, TIBC: 216, TSAT: 56, ferritin: 586, folate: 6.5, vitamin B12: 1308 -Discussed that with slightly worsening free light chains and hemoglobin, the safest option would be to do a bone marrow biopsy to rule out multiple myeloma or MDS. - Patient at this time is not a candidate for treatment even if it is multiple myeloma.  For low risk MDS the treatment is erythropoietin. - I will actually think this anemia is driven by chronic kidney disease and discuss starting erythropoietin.  Patient and daughter are in agreement with this.   -The patient denies recent history of bleeding such as epistaxis, hematuria or hematochezia. He is symptomatic from the anemia.  -We discussed the risks, benefits, side effects of erythropoietin stimulating agents for anemia, with the goal of keeping the hemoglobin greater than 10 g. -I discussed with the patient and potential side effects such as risk of thrombosis, severe hypertension, risk of congestive heart failure and stroke and he agreed to proceed. -I will start initiial dosing at 10000U every 2 weeks and we  will reassess his response rates after 3 treatments to assess whether dosage adjustment is needed.  -Keep TSAT goal of 25-30 with ferritin <400 with IV Iron  - Will obtain a baseline erythropoietin level  RTC in 3 months with labs   Thrombocytopenia Patient has chronic thrombocytopenia since from at least 2014 No hepatosplenomegaly on the CT scan from November 2024 No nutritional deficiency at this time Does not report any increased bleeding or bruises Patient has low copper  level Hepatitis panel: Negative Platelets significantly improved without copper  replacement  - Continue to monitor  Elevated serum immunoglobulin free light chains Patient has elevated free light chains with normal ratio Elevated IgG and IgA Autoimmune workup: Negative Slightly increased free light chain consistent with worsening kidney function at this time   - Will repeat labs in 3 months  Copper  deficiency Resolved at this time  CKD Secondary to diabetes.  Follows with Dr. Dennise.  Orders Placed This Encounter  Procedures   Ferritin    Standing Status:   Future    Expected Date:   04/21/2024    Expiration Date:   07/20/2024   Folate    Standing Status:   Future    Expected Date:   04/21/2024    Expiration Date:   07/20/2024   Vitamin B12    Standing Status:   Future    Expected Date:   04/21/2024    Expiration Date:   07/20/2024   CBC with Differential/Platelet    Standing Status:   Future    Expected Date:   04/21/2024    Expiration Date:   07/20/2024   Comprehensive metabolic panel with GFR  Standing Status:   Future    Expected Date:   04/21/2024    Expiration Date:   07/20/2024   Iron  and TIBC    Standing Status:   Future    Expected Date:   04/21/2024    Expiration Date:   07/20/2024   Erythropoietin    Standing Status:   Future    Expected Date:   02/29/2024    Expiration Date:   02/22/2025    The total time spent in the appointment was 23 minutes encounter with patients including  review of chart and various tests results, discussions about plan of care and coordination of care plan   All questions were answered. The patient knows to call the clinic with any problems, questions or concerns. No barriers to learning was detected.  Jacob Dry, MD 11/15/20257:48 AM   SUMMARY OF HEMATOLOGIC HISTORY: Normocytic anemia: Likely secondary to iron  deficiency and a component of anemia of chronic inflammation - S/p IV iron  Venofer  500 mg X 2 doses on 07/27/2023 and 08/02/2023 -Multiple myeloma workup: SPEP: No M spike, IFE: Polyclonal increase -Slightly elevated free light chains with normal ratio -Elevated IgG and IgA -Autoimmune workup: Negative  INTERVAL HISTORY: Discussed the use of AI scribe software for clinical note transcription with the patient, who gave verbal consent to proceed.  History of Present Illness Jacob Barnes is an 83 year old male who presents for follow-up for anemia. He is accompanied by his daughter today.   He feels generally well with no current complaints and mentions feeling good all the time.  He has free light chains, which have been slowly increasing over the past three months.  His hemoglobin levels have been low, potentially related to a bone marrow condition or kidney dysfunction. He has not been taking copper  supplements, but his copper  levels have improved after stopping zinc.  His kidney function has improved, with a current GFR of 34, up from a previous level of 27.  He wants to 'live longer' and relies on his faith for support and wants to get supportive treatments.    I have reviewed the past medical history, past surgical history, social history and family history with the patient   ALLERGIES:  is allergic to alpha-gal.  MEDICATIONS:  Current Outpatient Medications  Medication Sig Dispense Refill   acetaminophen  (TYLENOL ) 650 MG CR tablet Take 650 mg by mouth.     amLODipine  (NORVASC ) 2.5 MG tablet Take 2.5 mg by mouth  daily.     atorvastatin (LIPITOR) 10 MG tablet GIVE 1 TABLET BY MOUTH AT BEDTIME EVERY MONDAY/WEDNESDAY/FRIDAY FOR HIGH CHOLESTEROL 40 tablet 2   blood glucose meter kit and supplies Dispense based on patient and insurance preference. Use up to two times daily as directed. (FOR ICD-10 E10.9, E11.9). 1 each 0   CALCIUM -VITAMIN D  PO Take by mouth. (Patient taking differently: Take by mouth daily at 6 (six) AM.)     Cholecalciferol (VITAMIN D -3) 25 MCG (1000 UT) CAPS Take by mouth.     Continuous Glucose Sensor (FREESTYLE LIBRE 2 SENSOR) MISC Change sensor every 14 days 6 each 3   copper  tablet Take 1 tablet (2 mg total) by mouth daily. 30 tablet 3   donepezil  (ARICEPT ) 10 MG tablet TAKE 1 TABLET BY MOUTH EVERYDAY AT BEDTIME 90 tablet 3   dorzolamide -timolol  (COSOPT ) 2-0.5 % ophthalmic solution 1 drop 2 (two) times daily.     Insulin  Degludec (TRESIBA  [employer]prise) Inject into the skin every other day.  insulin  glargine (LANTUS  SOLOSTAR) 100 UNIT/ML Solostar Pen INJECT 0.15 MLS (15 UNITS TOTAL) INTO THE SKIN AT BEDTIME. 15 mL 1   Insulin  Pen Needle (PEN NEEDLES) 31G X 6 MM MISC 1 each by Does not apply route at bedtime. 100 each 2   latanoprost  (XALATAN ) 0.005 % ophthalmic solution 1 drop at bedtime.     levETIRAcetam  (KEPPRA ) 500 MG tablet TAKE 1 TABLET BY MOUTH TWICE A DAY 180 tablet 1   mirtazapine (REMERON) 15 MG tablet Take 15 mg by mouth.     Potassium 99 MG TABS Take 99 mg by mouth daily.     sitaGLIPtin  (JANUVIA ) 100 MG tablet Take 1 tablet (100 mg total) by mouth daily. 90 tablet 1   tamsulosin  (FLOMAX ) 0.4 MG CAPS capsule TAKE 1 CAPSULE BY MOUTH EVERY DAY AFTER SUPPER 90 capsule 1   Vitamin D , Ergocalciferol , (DRISDOL ) 1.25 MG (50000 UNIT) CAPS capsule Take 1 capsule (50,000 Units total) by mouth every 7 (seven) days. 5 capsule 8   No current facility-administered medications for this visit.     REVIEW OF SYSTEMS:   Constitutional: Denies fevers, chills or night sweats Eyes: Denies  blurriness of vision Ears, nose, mouth, throat, and face: Denies mucositis or sore throat Respiratory: Denies cough, dyspnea or wheezes Cardiovascular: Denies palpitation, chest discomfort or lower extremity swelling Gastrointestinal:  Denies nausea, heartburn or change in bowel habits Skin: Denies abnormal skin rashes Lymphatics: Denies new lymphadenopathy or easy bruising Neurological:Denies numbness, tingling or new weaknesses Behavioral/Psych: Mood is stable, no new changes  All other systems were reviewed with the patient and are negative.  PHYSICAL EXAMINATION:   Vitals:   02/22/24 1010  BP: 99/61  Pulse: (!) 59  Resp: 16  Temp: (!) 97.4 F (36.3 C)  SpO2: 100%     GENERAL:alert, no distress and frail male with hearing difficulty  SKIN: skin color, texture, turgor are normal, no rashes or significant lesions LUNGS: clear to auscultation and percussion with normal breathing effort HEART: regular rate & rhythm and no murmurs and no lower extremity edema ABDOMEN:abdomen soft, non-tender and normal bowel sounds Musculoskeletal:no cyanosis of digits and no clubbing  NEURO: alert & oriented x 3 with fluent speech  LABORATORY DATA:  I have reviewed the data as listed  Lab Results  Component Value Date   WBC 5.1 02/15/2024   NEUTROABS 2.4 02/15/2024   HGB 9.8 (L) 02/15/2024   HCT 31.1 (L) 02/15/2024   MCV 93.7 02/15/2024   PLT 131 (L) 02/15/2024        Chemistry      Component Value Date/Time   NA 137 02/15/2024 0906   NA 136 07/13/2023 1645   K 4.5 02/15/2024 0906   CL 105 02/15/2024 0906   CO2 23 02/15/2024 0906   BUN 31 (H) 02/15/2024 0906   BUN 16 07/13/2023 1645   CREATININE 1.94 (H) 02/15/2024 0906   CREATININE 1.36 (H) 09/30/2019 1008      Component Value Date/Time   CALCIUM  9.0 02/15/2024 0906   ALKPHOS 63 02/15/2024 0906   AST 30 02/15/2024 0906   ALT 28 02/15/2024 0906   BILITOT 0.6 02/15/2024 0906   BILITOT 0.4 07/13/2023 1645      Latest  Reference Range & Units 02/15/24 09:07  Iron  45 - 182 ug/dL 878  UIBC ug/dL 95  TIBC 749 - 549 ug/dL 783 (L)  Saturation Ratios 17.9 - 39.5 % 56 (H)  Ferritin 24 - 336 ng/mL 586 (H)  Folate >5.9 ng/mL  6.5  Vitamin B12 180 - 914 pg/mL 1,308 (H)  (L): Data is abnormally low (H): Data is abnormally high   Latest Reference Range & Units 02/15/24 09:07  Total Protein ELP 6.0 - 8.5 g/dL 8.7 (H) (C)  Albumin SerPl Elph-Mcnc 2.9 - 4.4 g/dL 3.8 (C)  Albumin/Glob SerPl 0.7 - 1.7  0.8 (C)  Alpha2 Glob SerPl Elph-Mcnc 0.4 - 1.0 g/dL 0.5 (C)  Alpha 1 0.0 - 0.4 g/dL 0.2 (C)  Gamma Glob SerPl Elph-Mcnc 0.4 - 1.8 g/dL 3.1 (H) (C)  M Protein SerPl Elph-Mcnc Not Observed g/dL Not Observed (C)  IFE 1  Comment ! (C)  Globulin, Total 2.2 - 3.9 g/dL 4.9 (H) (C)  B-Globulin SerPl Elph-Mcnc 0.7 - 1.3 g/dL 1.1 (C)  IgG (Immunoglobin G), Serum 603 - 1,613 mg/dL 6,670 (H)  IgM (Immunoglobulin M), Srm 15 - 143 mg/dL 72  IgA 61 - 562 mg/dL 094 (H)  (H): Data is abnormally high !: Data is abnormal (C): Corrected   Latest Reference Range & Units 02/15/24 09:06  Kappa free light chain 3.3 - 19.4 mg/L 100.0 (H)  Lambda free light chains 5.7 - 26.3 mg/L 64.2 (H)  Kappa, lambda light chain ratio 0.26 - 1.65  1.56  (H): Data is abnormally high   Latest Reference Range & Units 11/29/23 14:40  ANA Ab, IFA  Negative  CCP Antibodies IgG/IgA 0 - 19 units 5  RA Latex Turbid. <14.0 IU/mL <10.0   RADIOGRAPHIC STUDIES: I have personally reviewed the radiological images as listed and agreed with the findings in the report.  None new to review

## 2024-02-22 ENCOUNTER — Inpatient Hospital Stay (HOSPITAL_BASED_OUTPATIENT_CLINIC_OR_DEPARTMENT_OTHER): Admitting: Oncology

## 2024-02-22 ENCOUNTER — Inpatient Hospital Stay: Admitting: Oncology

## 2024-02-22 VITALS — BP 99/61 | HR 59 | Temp 97.4°F | Resp 16 | Wt 93.3 lb

## 2024-02-22 DIAGNOSIS — D696 Thrombocytopenia, unspecified: Secondary | ICD-10-CM | POA: Diagnosis not present

## 2024-02-22 DIAGNOSIS — R7689 Other specified abnormal immunological findings in serum: Secondary | ICD-10-CM

## 2024-02-22 DIAGNOSIS — D649 Anemia, unspecified: Secondary | ICD-10-CM | POA: Diagnosis not present

## 2024-02-22 DIAGNOSIS — E610 Copper deficiency: Secondary | ICD-10-CM | POA: Diagnosis not present

## 2024-02-23 ENCOUNTER — Encounter: Payer: Self-pay | Admitting: Oncology

## 2024-02-29 ENCOUNTER — Inpatient Hospital Stay

## 2024-02-29 ENCOUNTER — Inpatient Hospital Stay: Admitting: Oncology

## 2024-02-29 VITALS — BP 117/72 | HR 69 | Temp 96.6°F | Resp 18

## 2024-02-29 DIAGNOSIS — D649 Anemia, unspecified: Secondary | ICD-10-CM

## 2024-02-29 DIAGNOSIS — D509 Iron deficiency anemia, unspecified: Secondary | ICD-10-CM

## 2024-02-29 LAB — CBC
HCT: 29.1 % — ABNORMAL LOW (ref 39.0–52.0)
Hemoglobin: 9.4 g/dL — ABNORMAL LOW (ref 13.0–17.0)
MCH: 30.5 pg (ref 26.0–34.0)
MCHC: 32.3 g/dL (ref 30.0–36.0)
MCV: 94.5 fL (ref 80.0–100.0)
Platelets: 86 K/uL — ABNORMAL LOW (ref 150–400)
RBC: 3.08 MIL/uL — ABNORMAL LOW (ref 4.22–5.81)
RDW: 15.5 % (ref 11.5–15.5)
WBC: 3.6 K/uL — ABNORMAL LOW (ref 4.0–10.5)
nRBC: 0 % (ref 0.0–0.2)

## 2024-02-29 MED ORDER — EPOETIN ALFA 10000 UNIT/ML IJ SOLN
10000.0000 [IU] | Freq: Once | INTRAMUSCULAR | Status: AC
  Administered 2024-02-29: 10000 [IU] via SUBCUTANEOUS
  Filled 2024-02-29: qty 1

## 2024-02-29 NOTE — Patient Instructions (Signed)
 [facility] - A DEPT OF [facility]  Discharge Instructions: Thank you for choosing [facility] to provide your oncology and hematology care.  If you have a lab appointment with the Cancer Center - please note that after April 8th, 2024, all labs will be drawn in the cancer center.  You do not have to check in or register with the main entrance as you have in the past but will complete your check-in in the cancer center.  Wear comfortable clothing and clothing appropriate for easy access to any Portacath or PICC line.   We strive to give you quality time with your provider. You may need to reschedule your appointment if you arrive late (15 or more minutes).  Arriving late affects you and other patients whose appointments are after yours.  Also, if you miss three or more appointments without notifying the office, you may be dismissed from the clinic at the provider's discretion.      For prescription refill requests, have your pharmacy contact our office and allow 72 hours for refills to be completed.    Today you received the following chemotherapy and/or immunotherapy agents Epogen      To help prevent nausea and vomiting after your treatment, we encourage you to take your nausea medication as directed.  BELOW ARE SYMPTOMS THAT SHOULD BE REPORTED IMMEDIATELY: *FEVER GREATER THAN 100.4 F (38 C) OR HIGHER *CHILLS OR SWEATING *NAUSEA AND VOMITING THAT IS NOT CONTROLLED WITH YOUR NAUSEA MEDICATION *UNUSUAL SHORTNESS OF BREATH *UNUSUAL BRUISING OR BLEEDING *URINARY PROBLEMS (pain or burning when urinating, or frequent urination) *BOWEL PROBLEMS (unusual diarrhea, constipation, pain near the anus) TENDERNESS IN MOUTH AND THROAT WITH OR WITHOUT PRESENCE OF ULCERS (sore throat, sores in mouth, or a toothache) UNUSUAL RASH, SWELLING OR PAIN  UNUSUAL VAGINAL DISCHARGE OR ITCHING   Items with * indicate a potential emergency and should be followed up as  soon as possible or go to the Emergency Department if any problems should occur.  Please show the CHEMOTHERAPY ALERT CARD or IMMUNOTHERAPY ALERT CARD at check-in to the Emergency Department and triage nurse.  Should you have questions after your visit or need to cancel or reschedule your appointment, please contact [facility] [place] - A DEPT OF [facility] [phone]  and follow the prompts.  Office hours are 8:00 a.m. to 4:30 p.m. Monday - Friday. Please note that voicemails left after 4:00 p.m. may not be returned until the following business day.  We are closed weekends and major holidays. You have access to a nurse at all times for urgent questions. Please call the main number to the clinic [phone] and follow the prompts.  For any non-urgent questions, you may also contact your provider using MyChart. We now offer e-Visits for anyone 72 and older to request care online for non-urgent symptoms. For details visit [email or web address].   Also download the MyChart app! Go to the app store, search "MyChart", open the app, select [place], and log in with your MyChart username and password.

## 2024-02-29 NOTE — Progress Notes (Signed)
 Jacob Barnes presents today for Epogen  injection per the provider's orders.  Stable during administration without incident; injection site WNL; see MAR for injection details.  Patient tolerated procedure well and without incident.  No questions or complaints noted at this time.

## 2024-03-01 LAB — ERYTHROPOIETIN: Erythropoietin: 3.9 m[IU]/mL (ref 2.6–18.5)

## 2024-03-14 ENCOUNTER — Inpatient Hospital Stay

## 2024-03-14 ENCOUNTER — Inpatient Hospital Stay: Attending: Oncology

## 2024-03-14 VITALS — BP 120/72 | HR 64 | Temp 96.0°F | Resp 18

## 2024-03-14 DIAGNOSIS — R7989 Other specified abnormal findings of blood chemistry: Secondary | ICD-10-CM | POA: Diagnosis not present

## 2024-03-14 DIAGNOSIS — D696 Thrombocytopenia, unspecified: Secondary | ICD-10-CM | POA: Diagnosis not present

## 2024-03-14 DIAGNOSIS — D509 Iron deficiency anemia, unspecified: Secondary | ICD-10-CM

## 2024-03-14 DIAGNOSIS — D631 Anemia in chronic kidney disease: Secondary | ICD-10-CM | POA: Diagnosis present

## 2024-03-14 DIAGNOSIS — E610 Copper deficiency: Secondary | ICD-10-CM | POA: Insufficient documentation

## 2024-03-14 DIAGNOSIS — N184 Chronic kidney disease, stage 4 (severe): Secondary | ICD-10-CM | POA: Insufficient documentation

## 2024-03-14 DIAGNOSIS — E1122 Type 2 diabetes mellitus with diabetic chronic kidney disease: Secondary | ICD-10-CM | POA: Diagnosis present

## 2024-03-14 LAB — HEMOGLOBIN: Hemoglobin: 10.6 g/dL — ABNORMAL LOW (ref 13.0–17.0)

## 2024-03-14 MED ORDER — EPOETIN ALFA 10000 UNIT/ML IJ SOLN
10000.0000 [IU] | Freq: Once | INTRAMUSCULAR | Status: AC
  Administered 2024-03-14: 10000 [IU] via SUBCUTANEOUS
  Filled 2024-03-14: qty 1

## 2024-03-14 NOTE — Progress Notes (Signed)
 Patient tolerated injection with no complaints voiced.  Site clean and dry with no bruising or swelling noted at site.  See MAR for details.  Band aid applied.  Patient stable during and after injection.  Vss with discharge and left in satisfactory condition with no s/s of distress noted.

## 2024-03-14 NOTE — Patient Instructions (Signed)

## 2024-03-18 ENCOUNTER — Telehealth: Payer: Self-pay

## 2024-03-18 NOTE — Telephone Encounter (Signed)
 Lvm to cb

## 2024-03-18 NOTE — Telephone Encounter (Signed)
 Copied from CRM [id number]. Topic: Clinical - Medication Question >> Mar 18, 2024 10:11 AM Sophia H wrote: Reason for CRM: Son [name] Holcomb is calling in - wants to speak with nurse in clinic regarding the patients list of medications. # [phone]

## 2024-03-19 ENCOUNTER — Telehealth: Payer: Self-pay

## 2024-03-19 NOTE — Telephone Encounter (Signed)
 Copied from CRM #[id number]. Topic: Clinical - Medication Question >> Mar 19, 2024  2:07 PM Paige D wrote: Reason for CRM: Pt calling to speak to miss abby in regards to medications and would like a call back

## 2024-03-20 ENCOUNTER — Telehealth: Payer: Self-pay

## 2024-03-20 NOTE — Telephone Encounter (Signed)
 Copied from CRM #[id number]. Topic: General - Other >> Mar 20, 2024 10:10 AM Tonda B wrote: Reason for CRM: pt son is calling he has questions about pt care please call  [name] Melhorn (Son) [phone]

## 2024-03-20 NOTE — Telephone Encounter (Signed)
 Lvm for son to cb

## 2024-03-20 NOTE — Telephone Encounter (Signed)
 NOTED

## 2024-03-24 ENCOUNTER — Encounter: Payer: Self-pay | Admitting: Oncology

## 2024-03-24 NOTE — Progress Notes (Signed)
 SABRA

## 2024-03-26 ENCOUNTER — Ambulatory Visit: Admitting: Family Medicine

## 2024-03-27 ENCOUNTER — Ambulatory Visit (INDEPENDENT_AMBULATORY_CARE_PROVIDER_SITE_OTHER): Admitting: Gastroenterology

## 2024-03-28 ENCOUNTER — Inpatient Hospital Stay

## 2024-03-28 DIAGNOSIS — D509 Iron deficiency anemia, unspecified: Secondary | ICD-10-CM

## 2024-03-28 DIAGNOSIS — E1122 Type 2 diabetes mellitus with diabetic chronic kidney disease: Secondary | ICD-10-CM | POA: Diagnosis not present

## 2024-03-28 LAB — HEMOGLOBIN: Hemoglobin: 11.5 g/dL — ABNORMAL LOW (ref 13.0–17.0)

## 2024-03-28 NOTE — Progress Notes (Signed)
 Patient's Hgb 11.5. per parameters pt does not need Procrit  today. Pt updated and all questions answered at this time. All follow ups as scheduled.   Jacob Barnes

## 2024-04-03 ENCOUNTER — Emergency Department (HOSPITAL_COMMUNITY)

## 2024-04-03 ENCOUNTER — Encounter (HOSPITAL_COMMUNITY): Payer: Self-pay | Admitting: *Deleted

## 2024-04-03 ENCOUNTER — Emergency Department (HOSPITAL_COMMUNITY)
Admission: EM | Admit: 2024-04-03 | Discharge: 2024-04-04 | Disposition: A | Discharge status: Home / Self Care | Attending: Emergency Medicine | Admitting: Emergency Medicine

## 2024-04-03 ENCOUNTER — Other Ambulatory Visit: Payer: Self-pay

## 2024-04-03 DIAGNOSIS — J439 Emphysema, unspecified: Secondary | ICD-10-CM | POA: Insufficient documentation

## 2024-04-03 DIAGNOSIS — R10A2 Flank pain, left side: Secondary | ICD-10-CM | POA: Diagnosis present

## 2024-04-03 DIAGNOSIS — D696 Thrombocytopenia, unspecified: Secondary | ICD-10-CM | POA: Diagnosis not present

## 2024-04-03 DIAGNOSIS — N289 Disorder of kidney and ureter, unspecified: Secondary | ICD-10-CM

## 2024-04-03 DIAGNOSIS — N189 Chronic kidney disease, unspecified: Secondary | ICD-10-CM | POA: Insufficient documentation

## 2024-04-03 DIAGNOSIS — Z79899 Other long term (current) drug therapy: Secondary | ICD-10-CM | POA: Insufficient documentation

## 2024-04-03 DIAGNOSIS — N200 Calculus of kidney: Secondary | ICD-10-CM | POA: Diagnosis not present

## 2024-04-03 DIAGNOSIS — Z794 Long term (current) use of insulin: Secondary | ICD-10-CM | POA: Diagnosis not present

## 2024-04-03 DIAGNOSIS — I129 Hypertensive chronic kidney disease with stage 1 through stage 4 chronic kidney disease, or unspecified chronic kidney disease: Secondary | ICD-10-CM | POA: Diagnosis not present

## 2024-04-03 DIAGNOSIS — D649 Anemia, unspecified: Secondary | ICD-10-CM | POA: Diagnosis not present

## 2024-04-03 DIAGNOSIS — I700 Atherosclerosis of aorta: Secondary | ICD-10-CM | POA: Insufficient documentation

## 2024-04-03 DIAGNOSIS — M4856XA Collapsed vertebra, not elsewhere classified, lumbar region, initial encounter for fracture: Secondary | ICD-10-CM | POA: Insufficient documentation

## 2024-04-03 DIAGNOSIS — W19XXXA Unspecified fall, initial encounter: Secondary | ICD-10-CM

## 2024-04-03 DIAGNOSIS — E1122 Type 2 diabetes mellitus with diabetic chronic kidney disease: Secondary | ICD-10-CM | POA: Diagnosis not present

## 2024-04-03 LAB — CBC WITH DIFFERENTIAL/PLATELET
Abs Immature Granulocytes: 0.06 K/uL (ref 0.00–0.07)
Basophils Absolute: 0 K/uL (ref 0.0–0.1)
Basophils Relative: 0 %
Eosinophils Absolute: 0 K/uL (ref 0.0–0.5)
Eosinophils Relative: 0 %
HCT: 36 % — ABNORMAL LOW (ref 39.0–52.0)
Hemoglobin: 11.6 g/dL — ABNORMAL LOW (ref 13.0–17.0)
Immature Granulocytes: 1 %
Lymphocytes Relative: 24 %
Lymphs Abs: 1.3 K/uL (ref 0.7–4.0)
MCH: 31.1 pg (ref 26.0–34.0)
MCHC: 32.2 g/dL (ref 30.0–36.0)
MCV: 96.5 fL (ref 80.0–100.0)
Monocytes Absolute: 1 K/uL (ref 0.1–1.0)
Monocytes Relative: 17 %
Neutro Abs: 3.1 K/uL (ref 1.7–7.7)
Neutrophils Relative %: 58 %
Platelets: 61 K/uL — ABNORMAL LOW (ref 150–400)
RBC: 3.73 MIL/uL — ABNORMAL LOW (ref 4.22–5.81)
RDW: 16.2 % — ABNORMAL HIGH (ref 11.5–15.5)
WBC: 5.5 K/uL (ref 4.0–10.5)
nRBC: 0 % (ref 0.0–0.2)

## 2024-04-03 LAB — COMPREHENSIVE METABOLIC PANEL WITH GFR
ALT: 32 U/L (ref 0–44)
AST: 38 U/L (ref 15–41)
Albumin: 4 g/dL (ref 3.5–5.0)
Alkaline Phosphatase: 60 U/L (ref 38–126)
Anion gap: 14 (ref 5–15)
BUN: 43 mg/dL — ABNORMAL HIGH (ref 8–23)
CO2: 15 mmol/L — ABNORMAL LOW (ref 22–32)
Calcium: 8.6 mg/dL — ABNORMAL LOW (ref 8.9–10.3)
Chloride: 109 mmol/L (ref 98–111)
Creatinine, Ser: 2.07 mg/dL — ABNORMAL HIGH (ref 0.61–1.24)
GFR, Estimated: 31 mL/min — ABNORMAL LOW
Glucose, Bld: 234 mg/dL — ABNORMAL HIGH (ref 70–99)
Potassium: 4.4 mmol/L (ref 3.5–5.1)
Sodium: 137 mmol/L (ref 135–145)
Total Bilirubin: 0.6 mg/dL (ref 0.0–1.2)
Total Protein: 8.2 g/dL — ABNORMAL HIGH (ref 6.5–8.1)

## 2024-04-03 NOTE — Discharge Instructions (Addendum)
 Apply ice to the sore area.  Ice to be applied for 30 minutes at a time, 4 times a day.  May take acetaminophen  for pain.  If you still need additional pain relief, you may take ibuprofen.  Please try to minimize your dose of ibuprofen since it can make your kidney function worse.  Return to the emergency department if symptoms are getting worse.

## 2024-04-03 NOTE — ED Provider Notes (Signed)
 " [place] EMERGENCY DEPARTMENT AT [facility] Provider Note   CSN: [id number] Arrival date & time: 04/03/24  2008     Patient presents with: Flank Pain   Jacob Barnes is a 83 y.o. male.   The history is provided by the patient and a relative.  Flank Pain   He has history of hypertension, diabetes, hyperlipidemia, chronic kidney disease, GERD and apparently had a fall about 5 days ago.  Today, he started complaining of pain in the left flank.  There has been no fever or chills or sweats.  There has been no nausea or vomiting.  He denies any urinary difficulty.    Prior to Admission medications  Medication Sig Start Date End Date Taking? Authorizing Provider  acetaminophen  (TYLENOL ) 650 MG CR tablet Take 650 mg by mouth.    [provider]  amLODipine  (NORVASC ) 2.5 MG tablet Take 2.5 mg by mouth daily. 02/03/24   [provider]  atorvastatin  (LIPITOR) 10 MG tablet GIVE 1 TABLET BY MOUTH AT BEDTIME EVERY MONDAY/WEDNESDAY/FRIDAY FOR HIGH CHOLESTEROL 10/30/23   [name], MD  blood glucose meter kit and supplies Dispense based on patient and insurance preference. Use up to two times daily as directed. (FOR ICD-10 E10.9, E11.9). 04/27/22   Therisa Benton PARAS, NP  CALCIUM -VITAMIN D  PO Take by mouth. Patient taking differently: Take by mouth daily at 6 (six) AM.    [provider]  Cholecalciferol (VITAMIN D -3) 25 MCG (1000 UT) CAPS Take by mouth.    [provider]  Continuous Glucose Sensor (FREESTYLE LIBRE 2 SENSOR) MISC Change sensor every 14 days 02/01/23   Therisa Benton PARAS, NP  copper  tablet Take 1 tablet (2 mg total) by mouth daily. 12/14/23   Davonna Siad, MD  donepezil  (ARICEPT ) 10 MG tablet TAKE 1 TABLET BY MOUTH EVERYDAY AT BEDTIME 09/13/23   [name], MD  dorzolamide -timolol  (COSOPT ) 2-0.5 % ophthalmic solution 1 drop 2 (two) times daily. 05/09/22   [provider]  Insulin  Degludec (TRESIBA  [place]) Inject  into the skin every other day.    [provider]  insulin  glargine (LANTUS  SOLOSTAR) 100 UNIT/ML Solostar Pen INJECT 0.15 MLS (15 UNITS TOTAL) INTO THE SKIN AT BEDTIME. 01/17/24   Reardon, Benton PARAS, NP  Insulin  Pen Needle (PEN NEEDLES) 31G X 6 MM MISC 1 each by Does not apply route at bedtime. 11/18/21   Therisa Benton PARAS, NP  latanoprost  (XALATAN ) 0.005 % ophthalmic solution 1 drop at bedtime. 05/31/22   [provider]  levETIRAcetam  (KEPPRA ) 500 MG tablet TAKE 1 TABLET BY MOUTH TWICE A DAY 01/21/24   [name], MD  mirtazapine (REMERON) 15 MG tablet Take 15 mg by mouth. 01/14/24   [provider]  Potassium 99 MG TABS Take 99 mg by mouth daily.    [provider]  sitaGLIPtin  (JANUVIA ) 100 MG tablet Take 1 tablet (100 mg total) by mouth daily. 10/15/23   Therisa Benton PARAS, NP  tamsulosin  (FLOMAX ) 0.4 MG CAPS capsule TAKE 1 CAPSULE BY MOUTH EVERY DAY AFTER SUPPER 02/05/24   McKenzie, Belvie CROME, MD  Vitamin D , Ergocalciferol , (DRISDOL ) 1.25 MG (50000 UNIT) CAPS capsule Take 1 capsule (50,000 Units total) by mouth every 7 (seven) days. 02/10/23   [name], MD    Allergies: Alpha-gal    Review of Systems  Genitourinary:  Positive for flank pain.  All other systems reviewed and are negative.   Updated Vital Signs BP 124/73 (BP Location:  Right Arm)   Pulse 68   Temp (!) 96.4 F (35.8 C) (Temporal)   Resp 16   Ht 5' 4 (1.626 m)   Wt 42.3 kg   SpO2 100%   BMI 16.01 kg/m   Physical Exam Vitals and nursing note reviewed.   83 year old male, resting comfortably and in no acute distress. Vital signs are . Oxygen saturation is 100%, which is normal. Head is normocephalic and atraumatic. PERRLA, EOMI.  Neck is nontender. Back is tender in the left posterior rib cage without crepitus.  There is no definite CVA tenderness. Lungs are clear without rales, wheezes, or rhonchi. Chest is nontender except as noted above. Heart has regular  rate and rhythm without murmur. Abdomen is soft, flat, nontender. Extremities have no cyanosis or edema, full range of motion is present. Skin is warm and dry without rash. Neurologic: Awake and alert, moves all extremities equally.  (all labs ordered are listed, but only abnormal results are displayed) Labs Reviewed  COMPREHENSIVE METABOLIC PANEL WITH GFR - Abnormal; Notable for the following components:      Result Value   CO2 15 (*)    Glucose, Bld 234 (*)    BUN 43 (*)    Creatinine, Ser 2.07 (*)    Calcium  8.6 (*)    Total Protein 8.2 (*)    GFR, Estimated 31 (*)    All other components within normal limits  CBC WITH DIFFERENTIAL/PLATELET - Abnormal; Notable for the following components:   RBC 3.73 (*)    Hemoglobin 11.6 (*)    HCT 36.0 (*)    RDW 16.2 (*)    Platelets 61 (*)    All other components within normal limits    Radiology: CT CHEST ABDOMEN PELVIS WO CONTRAST Result Date: 04/03/2024 CLINICAL DATA:  Blunt trauma. Patient reports left flank pain. Recent fall. EXAM: CT CHEST, ABDOMEN AND PELVIS WITHOUT CONTRAST TECHNIQUE: Multidetector CT imaging of the chest, abdomen and pelvis was performed following the standard protocol without IV contrast. RADIATION DOSE REDUCTION: This exam was performed according to the departmental dose-optimization program which includes automated exposure control, adjustment of the mA and/or kV according to patient size and/or use of iterative reconstruction technique. COMPARISON:  Rib radiographs earlier today, abdominopelvic CT 02/14/2023 FINDINGS: CT CHEST FINDINGS Cardiovascular: The heart is normal in size. There are coronary artery calcifications. Aortic atherosclerosis. No perivascular stranding to suggest injury. Lack of contrast limits detailed assessment. Mediastinum/Nodes: No mediastinal hemorrhage. No mediastinal adenopathy. No pneumomediastinum. Unremarkable appearance of the esophagus. Lungs/Pleura: No pneumothorax. Tree-in-bud  opacities in the left upper and left lower lobes. No pleural effusion. Mild emphysema. Musculoskeletal: The bones are subjectively under mineralized. No acute fracture of the ribs, sternum, included clavicles or shoulder girdles. Mild bilateral shoulder osteoarthritis. No acute fracture of the thoracic spine. There is no confluent body wall contusion. Paucity of body fat. CT ABDOMEN PELVIS FINDINGS Noncontrast CT imaging in the setting of acute trauma significantly limits the detection of subtle though potentially significant traumatic findings. Hepatobiliary: No perihepatic hematoma. Homogeneous liver attenuation. Decompressed gallbladder. Pancreas: Not well assessed, but no evidence of injury. Spleen: No perisplenic hematoma.  Homogeneous attenuation. Adrenals/Urinary Tract: No adrenal hemorrhage. No evidence of renal injury or inflammation. 2 cm stone in the left renal pelvis. Additional punctate left intrarenal calculi. There is prominence of the left renal collecting system. No perinephric inflammation. Multiple right intrarenal calculi, largest in the lower pole measures 7 mm. No evidence of bladder injury or stone. Stomach/Bowel:  Bowel assessment is limited in the absence of contrast and paucity of intra-abdominal fat. Allowing for this, there is no evidence of bowel injury. Small to moderate colonic stool burden. Vascular/Lymphatic: Aortic and branch atherosclerosis. No retroperitoneal stranding to suggest vascular injury. Limited assessment for adenopathy on the current exam. Reproductive: Prominent prostate. Other: No free air or free fluid. Musculoskeletal: The bones are under mineralized. Chronic severe L1 compression fracture, stable in appearance. Remote fracture of the right iliac bone. Undulation of the L5 superior endplate is chronic. Multilevel degenerative change in the spine. IMPRESSION: 1. No evidence of acute traumatic injury to the chest, abdomen, or pelvis allowing for limitations of  noncontrast exam. 2. Tree-in-bud opacities in the left upper and left lower lobes, typical of infectious bronchiolitis. 3. Bilateral nephrolithiasis. There is a 2 cm stone in the left renal pelvis with prominence of the left renal collecting system. 4. Chronic severe L1 compression fracture. Remote right iliac bone fracture. Aortic Atherosclerosis (ICD10-I70.0) and Emphysema (ICD10-J43.9). Electronically Signed   By: Andrea Gasman M.D.   On: 04/03/2024 22:34   DG Ribs Unilateral W/Chest Left Result Date: 04/03/2024 CLINICAL DATA:  Left lower rib pain. EXAM: LEFT RIBS AND CHEST - 3+ VIEW COMPARISON:  None Available. FINDINGS: No acute fracture or other bone lesions are seen involving the ribs. Remote healed fractures of lower ninth and tenth ribs. There is no evidence of pneumothorax or pleural effusion. Both lungs are clear. Heart size and mediastinal contours are within normal limits. IMPRESSION: No acute rib fracture or focal rib abnormality. Remote healed fractures of lower ninth and tenth ribs. Electronically Signed   By: Andrea Gasman M.D.   On: 04/03/2024 21:13     Procedures   Medications Ordered in the ED - No data to display                                  Medical Decision Making Amount and/or Complexity of Data Reviewed Labs: ordered. Radiology: ordered.   Pain in the left lower rib cage posteriorly, possible fracture versus contusion.  Also considered urolithiasis, diverticulitis.  X-ray showed no acute fracture.  Evidently viewed the images, and agree with the radiologist's interpretation.  I have ordered screening labs and noncontrast CT scan to evaluate for other trauma and also look for possible ureterolithiasis.  I have reviewed his past records, and noted CT renal stone study on 02/14/2023 showing an 8 mm calculus at the level of the left renal pelvis without hydronephrosis.  I reviewed his laboratory tests and my interpretation is stable renal insufficiency, low CO2 but  with normal anion gap-clinical significance is unclear, anemia which is improved over baseline, stable thrombocytopenia.  CT scan shows no acute traumatic injury.  Bilateral nephrolithiasis present without evidence of hydronephrosis.  Chronic L1 compression fracture.  Reported tree-in-bud opacities in the left upper and left lower lobes typical of infectious bronchiolitis, but he clinically does not have infectious bronchiolitis.  I have independently viewed the images, and agree with radiologist's interpretation.  I am discharging him with instructions to apply ice and use over-the-counter NSAIDs and acetaminophen  as needed for pain.     Final diagnoses:  Fall at home, initial encounter  Left flank pain  Renal insufficiency  Normochromic normocytic anemia  Thrombocytopenia    ED Discharge Orders     None          Raford Lenis, MD 04/03/24 2348  "

## 2024-04-03 NOTE — ED Triage Notes (Signed)
 Pt with c/o left flank pain and recent fall over the past weekend. Denies hitting his head with the fall. Denies any blood thinners.

## 2024-04-07 ENCOUNTER — Inpatient Hospital Stay (HOSPITAL_COMMUNITY)

## 2024-04-07 ENCOUNTER — Other Ambulatory Visit: Payer: Self-pay

## 2024-04-07 ENCOUNTER — Encounter: Payer: Self-pay | Admitting: *Deleted

## 2024-04-07 ENCOUNTER — Encounter (HOSPITAL_COMMUNITY): Payer: Self-pay

## 2024-04-07 ENCOUNTER — Emergency Department (HOSPITAL_COMMUNITY)

## 2024-04-07 ENCOUNTER — Inpatient Hospital Stay (HOSPITAL_COMMUNITY)
Admission: EM | Admit: 2024-04-07 | Discharge: 2024-04-21 | DRG: 064 | Disposition: A | Discharge status: Skilled Nursing Facility | Attending: Student | Admitting: Student

## 2024-04-07 DIAGNOSIS — I619 Nontraumatic intracerebral hemorrhage, unspecified: Secondary | ICD-10-CM | POA: Diagnosis not present

## 2024-04-07 DIAGNOSIS — D696 Thrombocytopenia, unspecified: Secondary | ICD-10-CM | POA: Diagnosis not present

## 2024-04-07 DIAGNOSIS — Z794 Long term (current) use of insulin: Secondary | ICD-10-CM | POA: Diagnosis not present

## 2024-04-07 DIAGNOSIS — I615 Nontraumatic intracerebral hemorrhage, intraventricular: Principal | ICD-10-CM

## 2024-04-07 DIAGNOSIS — R262 Difficulty in walking, not elsewhere classified: Secondary | ICD-10-CM | POA: Diagnosis not present

## 2024-04-07 DIAGNOSIS — I959 Hypotension, unspecified: Secondary | ICD-10-CM | POA: Diagnosis not present

## 2024-04-07 DIAGNOSIS — L8990 Pressure ulcer of unspecified site, unspecified stage: Secondary | ICD-10-CM | POA: Insufficient documentation

## 2024-04-07 DIAGNOSIS — R29705 NIHSS score 5: Secondary | ICD-10-CM

## 2024-04-07 DIAGNOSIS — I10 Essential (primary) hypertension: Secondary | ICD-10-CM | POA: Diagnosis not present

## 2024-04-07 DIAGNOSIS — E119 Type 2 diabetes mellitus without complications: Secondary | ICD-10-CM | POA: Diagnosis not present

## 2024-04-07 DIAGNOSIS — R29704 NIHSS score 4: Secondary | ICD-10-CM | POA: Diagnosis not present

## 2024-04-07 DIAGNOSIS — E43 Unspecified severe protein-calorie malnutrition: Secondary | ICD-10-CM | POA: Insufficient documentation

## 2024-04-07 DIAGNOSIS — Z7401 Bed confinement status: Secondary | ICD-10-CM | POA: Diagnosis not present

## 2024-04-07 LAB — URINALYSIS, ROUTINE W REFLEX MICROSCOPIC
Bacteria, UA: NONE SEEN
Bilirubin Urine: NEGATIVE
Glucose, UA: NEGATIVE mg/dL
Ketones, ur: NEGATIVE mg/dL
Leukocytes,Ua: NEGATIVE
Nitrite: NEGATIVE
Protein, ur: 100 mg/dL — AB
RBC / HPF: >50 RBC/hpf (ref 0–5)
Specific Gravity, Urine: 1.013 (ref 1.005–1.030)
pH: 5 (ref 5.0–8.0)

## 2024-04-07 LAB — CBG MONITORING, ED: Glucose-Capillary: 112 mg/dL — ABNORMAL HIGH (ref 70–99)

## 2024-04-07 LAB — COMPREHENSIVE METABOLIC PANEL WITH GFR
ALT: 28 U/L (ref 0–44)
AST: 34 U/L (ref 15–41)
Albumin: 4.2 g/dL (ref 3.5–5.0)
Alkaline Phosphatase: 55 U/L (ref 38–126)
Anion gap: 15 (ref 5–15)
BUN: 31 mg/dL — ABNORMAL HIGH (ref 8–23)
CO2: 17 mmol/L — ABNORMAL LOW (ref 22–32)
Calcium: 8.9 mg/dL (ref 8.9–10.3)
Chloride: 110 mmol/L (ref 98–111)
Creatinine, Ser: 1.81 mg/dL — ABNORMAL HIGH (ref 0.61–1.24)
GFR, Estimated: 36 mL/min — ABNORMAL LOW
Glucose, Bld: 87 mg/dL (ref 70–99)
Potassium: 4.1 mmol/L (ref 3.5–5.1)
Sodium: 142 mmol/L (ref 135–145)
Total Bilirubin: 0.7 mg/dL (ref 0.0–1.2)
Total Protein: 8.7 g/dL — ABNORMAL HIGH (ref 6.5–8.1)

## 2024-04-07 LAB — GLUCOSE, CAPILLARY
Glucose-Capillary: 106 mg/dL — ABNORMAL HIGH (ref 70–99)
Glucose-Capillary: 102 mg/dL — ABNORMAL HIGH (ref 70–99)

## 2024-04-07 LAB — CBC
HCT: 44 % (ref 39.0–52.0)
Hemoglobin: 13.6 g/dL (ref 13.0–17.0)
MCH: 29.8 pg (ref 26.0–34.0)
MCHC: 30.9 g/dL (ref 30.0–36.0)
MCV: 96.3 fL (ref 80.0–100.0)
Platelets: 64 K/uL — ABNORMAL LOW (ref 150–400)
RBC: 4.57 MIL/uL (ref 4.22–5.81)
RDW: 16.3 % — ABNORMAL HIGH (ref 11.5–15.5)
WBC: 4.4 K/uL (ref 4.0–10.5)
nRBC: 0 % (ref 0.0–0.2)

## 2024-04-07 LAB — PROTIME-INR
INR: 1.3 — ABNORMAL HIGH (ref 0.8–1.2)
Prothrombin Time: 16.8 s — ABNORMAL HIGH (ref 11.4–15.2)

## 2024-04-07 LAB — HIV ANTIBODY (ROUTINE TESTING W REFLEX): HIV Screen 4th Generation wRfx: NONREACTIVE

## 2024-04-07 LAB — TROPONIN T, HIGH SENSITIVITY
Troponin T High Sensitivity: 113 ng/L (ref 0–19)
Troponin T High Sensitivity: 117 ng/L (ref 0–19)

## 2024-04-07 LAB — AMMONIA: Ammonia: 19 umol/L (ref 9–35)

## 2024-04-07 MED ORDER — LEVETIRACETAM (KEPPRA) 500 MG/5 ML ADULT IV PUSH
500.0000 mg | Freq: Two times a day (BID) | INTRAVENOUS | Status: DC
  Administered 2024-04-07: 500 mg via INTRAVENOUS
  Filled 2024-04-07: qty 5

## 2024-04-07 MED ORDER — LATANOPROST 0.005 % OP SOLN
1.0000 [drp] | Freq: Every day | OPHTHALMIC | Status: DC
  Administered 2024-04-07 – 2024-04-20 (×14): 1 [drp] via OPHTHALMIC
  Filled 2024-04-07 (×2): qty 2.5

## 2024-04-07 MED ORDER — CHLORHEXIDINE GLUCONATE CLOTH 2 % EX PADS
6.0000 | MEDICATED_PAD | Freq: Every day | CUTANEOUS | Status: DC
  Administered 2024-04-07 – 2024-04-20 (×13): 6 via TOPICAL

## 2024-04-07 MED ORDER — IOHEXOL 350 MG/ML SOLN
75.0000 mL | Freq: Once | INTRAVENOUS | Status: AC | PRN
  Administered 2024-04-07: 75 mL via INTRAVENOUS

## 2024-04-07 MED ORDER — ADULT MULTIVITAMIN W/MINERALS CH
1.0000 | ORAL_TABLET | Freq: Every day | ORAL | Status: DC
  Administered 2024-04-08 – 2024-04-21 (×14): 1 via ORAL
  Filled 2024-04-07 (×14): qty 1

## 2024-04-07 MED ORDER — SODIUM CHLORIDE 0.9% IV SOLUTION: Freq: Once | INTRAVENOUS | Status: AC

## 2024-04-07 MED ORDER — LACTATED RINGERS IV BOLUS
500.0000 mL | Freq: Once | INTRAVENOUS | Status: AC
  Administered 2024-04-07: 500 mL via INTRAVENOUS

## 2024-04-07 MED ORDER — ATORVASTATIN CALCIUM 10 MG PO TABS
10.0000 mg | ORAL_TABLET | Freq: Every day | ORAL | Status: DC
  Administered 2024-04-08 – 2024-04-21 (×14): 10 mg via ORAL
  Filled 2024-04-07 (×14): qty 1

## 2024-04-07 MED ORDER — DONEPEZIL HCL 10 MG PO TABS
10.0000 mg | ORAL_TABLET | Freq: Every day | ORAL | Status: DC
  Administered 2024-04-08 – 2024-04-12 (×5): 10 mg via ORAL
  Filled 2024-04-07 (×5): qty 1

## 2024-04-07 MED ORDER — STROKE: EARLY STAGES OF RECOVERY BOOK
Freq: Once | Status: AC
  Administered 2024-04-08: 1
  Filled 2024-04-07: qty 1

## 2024-04-07 MED ORDER — LEVETIRACETAM 500 MG PO TABS
500.0000 mg | ORAL_TABLET | Freq: Two times a day (BID) | ORAL | Status: AC
  Administered 2024-04-08 – 2024-04-13 (×11): 500 mg via ORAL
  Filled 2024-04-07 (×11): qty 1

## 2024-04-07 MED ORDER — IOHEXOL 350 MG/ML SOLN
60.0000 mL | Freq: Once | INTRAVENOUS | Status: AC | PRN
  Administered 2024-04-07: 60 mL via INTRAVENOUS

## 2024-04-07 MED ORDER — INSULIN ASPART 100 UNIT/ML IJ SOLN
0.0000 [IU] | Freq: Every day | INTRAMUSCULAR | Status: DC
  Administered 2024-04-08 – 2024-04-10 (×2): 2 [IU] via SUBCUTANEOUS
  Administered 2024-04-11: 3 [IU] via SUBCUTANEOUS
  Administered 2024-04-12: 4 [IU] via SUBCUTANEOUS
  Administered 2024-04-14 – 2024-04-15 (×2): 2 [IU] via SUBCUTANEOUS
  Administered 2024-04-16: 1 [IU] via SUBCUTANEOUS
  Filled 2024-04-07: qty 2
  Filled 2024-04-07: qty 3
  Filled 2024-04-07: qty 2
  Filled 2024-04-07: qty 1
  Filled 2024-04-07: qty 2
  Filled 2024-04-07: qty 3
  Filled 2024-04-07: qty 4

## 2024-04-07 MED ORDER — PANTOPRAZOLE SODIUM 40 MG IV SOLR
40.0000 mg | Freq: Every day | INTRAVENOUS | Status: DC
  Administered 2024-04-07: 40 mg via INTRAVENOUS
  Filled 2024-04-07: qty 10

## 2024-04-07 MED ORDER — ACETAMINOPHEN 160 MG/5ML PO SOLN: 650.0000 mg | ORAL | Status: DC | PRN

## 2024-04-07 MED ORDER — ACETAMINOPHEN 325 MG PO TABS
650.0000 mg | ORAL_TABLET | ORAL | Status: DC | PRN
  Administered 2024-04-11: 650 mg via ORAL
  Filled 2024-04-07: qty 2

## 2024-04-07 MED ORDER — CLEVIDIPINE BUTYRATE 0.5 MG/ML IV EMUL
0.0000 mg/h | INTRAVENOUS | Status: DC
  Administered 2024-04-07 – 2024-04-08 (×2): 2 mg/h via INTRAVENOUS
  Filled 2024-04-07 (×2): qty 50

## 2024-04-07 MED ORDER — ACETAMINOPHEN 650 MG RE SUPP: 650.0000 mg | RECTAL | Status: DC | PRN

## 2024-04-07 MED ORDER — INSULIN ASPART 100 UNIT/ML IJ SOLN
0.0000 [IU] | Freq: Three times a day (TID) | INTRAMUSCULAR | Status: DC
  Administered 2024-04-08: 7 [IU] via SUBCUTANEOUS
  Administered 2024-04-08 – 2024-04-09 (×2): 2 [IU] via SUBCUTANEOUS
  Filled 2024-04-07: qty 2
  Filled 2024-04-07: qty 7
  Filled 2024-04-07: qty 2

## 2024-04-07 MED ORDER — SENNOSIDES-DOCUSATE SODIUM 8.6-50 MG PO TABS
1.0000 | ORAL_TABLET | Freq: Two times a day (BID) | ORAL | Status: DC
  Administered 2024-04-08: 1 via ORAL
  Filled 2024-04-07: qty 1

## 2024-04-07 NOTE — ED Notes (Signed)
 Patient transported to MRI

## 2024-04-07 NOTE — H&P (Signed)
 NEUROLOGY H&P NOTE   Date of service: April 07, 2024 Patient Name: Jacob Barnes MRN:  [id number] DOB:  11-24-1940 Chief Complaint: confusion, IVH  History of Present Illness  Jacob Barnes is a 83 y.o. male with hx of dementia, HTN, HLD, GERD, DM2, osteoporosis, anemia, thrombocytopenia, prior L SDH, retinopathy, who is brought in to the ED with confusion and generalized weakness.  Per son, pt stays with daughter, at baseline, he is able to ambulate with his cane/walker. He initially was brought in for complaining of L flank pain with CT chest, abdomen and pelvis being negative. He was complaining of being unsteady on his feet when attempting to walk. He might have had a unwitnessed fall last night per son but did fll about 3 weeks ago. Since about christmas, he seems more confused.  Pt used to work in a radio broadcast assistant and reports chronic BL hand contractures since atleast a couple decades.  CT head was obtained and demonstrated IVH centered in the L choroid plexus with layering hemorrhage in BL occipital lobes. There is slight increase in the size of temporal horns.  Last known well: unclear Modified rankin score: 3-Moderate disability-requires help but walks WITHOUT assistance ICH Score: 2(Age > 80, IVH) tNKASE: Not offered due to ICH Thrombectomy: not offered due to ICH NIHSS components Score: Comment  1a Level of Conscious 0[x]  1[]  2[]  3[]      1b LOC Questions 0[]  1[]  2[x]     Thinks he is 69 something and thinks this is january  1c LOC Commands 0[x]  1[]  2[]       2 Best Gaze 0[x]  1[]  2[]       3 Visual 0[]  1[]  2[x]  3[]    Suspect a combination of retinopathy as well as ?inconsistent responses in R hemivisual field.  4 Facial Palsy 0[x]  1[]  2[]  3[]      5a Motor Arm - left 0[x]  1[]  2[]  3[]  4[]  UN[]    5b Motor Arm - Right 0[x]  1[]  2[]  3[]  4[]  UN[]    6a Motor Leg - Left 0[x]  1[]  2[]  3[]  4[]  UN[]    6b Motor Leg - Right 0[x]  1[]  2[]  3[]  4[]  UN[]    7 Limb Ataxia 0[x]  1[]  2[]  UN[]      8  Sensory 0[x]  1[]  2[]  UN[]      9 Best Language 0[x]  1[]  2[]  3[]      10 Dysarthria 0[]  1[x]  2[]  UN[]      11 Extinct. and Inattention 0[x]  1[]  2[]       TOTAL: 5      ROS  Comprehensive ROS performed and pertinent positives documented in the HPI.  Past History   Past Medical History:  Diagnosis Date   Abrasion of right middle finger with infection    for 1 week,    Allergy 2023   Amputation of right middle finger 10/10/2016   Anemia    Arthritis    hands   Carpal tunnel syndrome 08/05/2009   Qualifier: Diagnosis of  By: [name], [name]     Cataract 2017   Chronic kidney disease    Diabetes mellitus    says since 1979 type 2   Diabetes mellitus without complication (HCC)    Phreesia 04/03/2020   ERECTILE DYSFUNCTION, ORGANIC 01/20/2009   Qualifier: Diagnosis of  By: Antonetta MD, Margaret     GERD (gastroesophageal reflux disease)    Glaucoma    both eyes   History of kidney stones    Hyperlipemia    Hypertension    Iron  deficiency anemia 01/10/2014  Osteoporosis September 2021   Other pancytopenia (HCC) 01/10/2014   New in 01/2014, undergoing pathology review    Rash    front abdomen no drainage   Past Surgical History:  Procedure Laterality Date   AMPUTATION Right 09/01/2016   Procedure: AMPUTATION RIGHT MIDDLE FINGER TIP;  Surgeon: [name], MD;  Location: [facility];  Service: Orthopedics;  Laterality: Right;   AMPUTATION Right 10/10/2016   Procedure: REPEAT IRRIGATION AND DEBRIDEMENT RIGHT MIDDLE FINGER WITH AMPUTATION THROUGH PROXIMAL PHALANX;  Surgeon: [name], MD;  Location: [facility];  Service: Orthopedics;  Laterality: Right;   APPENDECTOMY     BIOPSY  01/13/2022   Procedure: BIOPSY;  Surgeon: [name], MD;  Location: [facility];  Service: Gastroenterology;;   COLONOSCOPY  09/06/2011   Procedure: COLONOSCOPY;  Surgeon: [name], MD;  Location: [facility];  Service: Endoscopy;  Laterality: N/A;  830    COLONOSCOPY WITH PROPOFOL  N/A 01/13/2022   Procedure: COLONOSCOPY WITH PROPOFOL ;  Surgeon: [name], MD;  Location: [facility];  Service: Gastroenterology;  Laterality: N/A;  200 ASA 3   CYSTOSCOPY/RETROGRADE/URETEROSCOPY/STONE EXTRACTION WITH BASKET     ESOPHAGOGASTRODUODENOSCOPY N/A 01/15/2014   Procedure: ESOPHAGOGASTRODUODENOSCOPY (EGD);  Surgeon: [name], MD;  Location: [facility];  Service: Endoscopy;  Laterality: N/A;  200   ESOPHAGOGASTRODUODENOSCOPY N/A 04/22/2014   Procedure: ESOPHAGOGASTRODUODENOSCOPY (EGD);  Surgeon: [name], MD;  Location: [facility];  Service: Endoscopy;  Laterality: N/A;  240   EXTRACORPOREAL SHOCK WAVE LITHOTRIPSY Left 01/02/2023   Procedure: EXTRACORPOREAL SHOCK WAVE LITHOTRIPSY (ESWL);  Surgeon: [name], MD;  Location: [facility];  Service: Urology;  Laterality: Left;   EYE SURGERY Bilateral yrs ago   ioc for cataract   IR ANGIO EXTERNAL CAROTID SEL EXT CAROTID UNI L MOD SED  03/22/2022   IR ANGIO INTRA EXTRACRAN SEL INTERNAL CAROTID UNI L MOD SED  03/26/2022   IR ANGIOGRAM FOLLOW UP STUDY  03/26/2022   IR NEURO EACH ADD'L AFTER BASIC UNI LEFT (MS)  03/26/2022   IR TRANSCATH/EMBOLIZ  03/26/2022   MALONEY DILATION N/A 01/15/2014   Procedure: AGAPITO DILATION;  Surgeon: [name], MD;  Location: [facility];  Service: Endoscopy;  Laterality: N/A;   MALONEY DILATION  04/22/2014   Procedure: AGAPITO DILATION;  Surgeon: [name], MD;  Location: [facility];  Service: Endoscopy;;   POLYPECTOMY  01/13/2022   Procedure: POLYPECTOMY;  Surgeon: [name], [name], MD;  Location: [facility];  Service: Gastroenterology;;   RADIOLOGY WITH ANESTHESIA Left 03/22/2022   Procedure: LEFT MMA EMBOLIZATION;  Surgeon: Lanis Pupa, MD;  Location: [facility];  Service: Radiology;  Laterality: Left;   surgery for left eye bleedf  2017   Family History  Problem Relation Age of Onset   Diabetes Mother     Hypertension Mother    Hyperlipidemia Mother    Stroke Mother    Heart failure Mother    Diabetes Father    Heart disease Father    Hypertension Father    Hyperlipidemia Father    Heart attack Father    Diabetes Brother    Diabetes Brother    Diabetes Son    Social History   Socioeconomic History   Marital status: Married    Spouse name: Not on file   Number of children: Not on file   Years of education: Not on file   Highest education level: Not on file  Occupational  History   Not on file  Tobacco Use   Smoking status: Never    Passive exposure: Never   Smokeless tobacco: Never  Vaping Use   Vaping status: Never Used  Substance and Sexual Activity   Alcohol use: No   Drug use: No   Sexual activity: Yes  Other Topics Concern   Not on file  Social History Narrative   Not on file   Social Drivers of Health   Tobacco Use: Low Risk (04/07/2024)   Patient History    Smoking Tobacco Use: Never    Smokeless Tobacco Use: Never    Passive Exposure: Never  Financial Resource Strain: Patient Declined (02/05/2024)   Overall Financial Resource Strain (CARDIA)    Difficulty of Paying Living Expenses: Patient declined  Food Insecurity: Food Insecurity Present (02/05/2024)   Epic    Worried About Programme Researcher, Broadcasting/film/video in the Last Year: Often true    Ran Out of Food in the Last Year: Often true  Transportation Needs: No Transportation Needs (02/05/2024)   Epic    Lack of Transportation (Medical): No    Lack of Transportation (Non-Medical): No  Physical Activity: Inactive (02/05/2024)   Exercise Vital Sign    Days of Exercise per Week: 0 days    Minutes of Exercise per Session: 0 min  Stress: No Stress Concern Present (02/05/2024)   [employer] of Occupational Health - Occupational Stress Questionnaire    Feeling of Stress: Not at all  Social Connections: Moderately Integrated (02/05/2024)   Social Connection and Isolation Panel    Frequency of Communication with  Friends and Family: More than three times a week    Frequency of Social Gatherings with Friends and Family: Once a week    Attends Religious Services: More than 4 times per year    Active Member of Clubs or Organizations: No    Attends Banker Meetings: Never    Marital Status: Married  Depression (PHQ2-9): Low Risk (02/22/2024)   Depression (PHQ2-9)    PHQ-2 Score: 2  Alcohol Screen: Low Risk (02/05/2024)   Alcohol Screen    Last Alcohol Screening Score (AUDIT): 0  Housing: Low Risk (02/05/2024)   Epic    Unable to Pay for Housing in the Last Year: No    Number of Times Moved in the Last Year: 0    Homeless in the Last Year: No  Utilities: Not At Risk (02/05/2024)   Epic    Threatened with loss of utilities: No  Health Literacy: Adequate Health Literacy (02/05/2024)   B1300 Health Literacy    Frequency of need for help with medical instructions: Never   Allergies[1]  Medications   Medications Prior to Admission  Medication Sig Dispense Refill Last Dose/Taking   acetaminophen  (TYLENOL ) 650 MG CR tablet Take 650 mg by mouth every 8 (eight) hours as needed for pain.   Past Week   atorvastatin  (LIPITOR) 10 MG tablet GIVE 1 TABLET BY MOUTH AT BEDTIME EVERY MONDAY/WEDNESDAY/FRIDAY FOR HIGH CHOLESTEROL 40 tablet 2 04/04/2024   CALCIUM -VITAMIN D  PO Take by mouth. (Patient taking differently: Take by mouth daily at 6 (six) AM.)   04/06/2024   Cholecalciferol (VITAMIN D -3) 25 MCG (1000 UT) CAPS Take 1 capsule by mouth daily.   04/06/2024   donepezil  (ARICEPT ) 10 MG tablet TAKE 1 TABLET BY MOUTH EVERYDAY AT BEDTIME 90 tablet 3 04/06/2024 Bedtime   dorzolamide -timolol  (COSOPT ) 2-0.5 % ophthalmic solution Place 1 drop into both eyes 2 (two) times daily.  04/06/2024   Insulin  Degludec 100 UNIT/ML SOLN Inject 6-7 Units into the skin daily as needed (For blood sugar levels between 280-300).   04/06/2024   latanoprost  (XALATAN ) 0.005 % ophthalmic solution Place 1 drop into both  eyes at bedtime.   04/06/2024   levETIRAcetam  (KEPPRA ) 500 MG tablet TAKE 1 TABLET BY MOUTH TWICE A DAY 180 tablet 1 04/06/2024 at 10:00 AM   Potassium 99 MG TABS Take 99 mg by mouth daily.   04/06/2024   tamsulosin  (FLOMAX ) 0.4 MG CAPS capsule TAKE 1 CAPSULE BY MOUTH EVERY DAY AFTER SUPPER (Patient taking differently: Take 0.4 mg by mouth daily after supper.) 90 capsule 1 04/06/2024   amLODipine  (NORVASC ) 2.5 MG tablet Take 2.5 mg by mouth daily. (Patient not taking: Reported on 04/07/2024)   Not Taking   blood glucose meter kit and supplies Dispense based on patient and insurance preference. Use up to two times daily as directed. (FOR ICD-10 E10.9, E11.9). (Patient not taking: Reported on 04/07/2024) 1 each 0 Not Taking   Continuous Glucose Sensor (FREESTYLE LIBRE 2 SENSOR) MISC Change sensor every 14 days (Patient not taking: Reported on 04/07/2024) 6 each 3 Not Taking   copper  tablet Take 1 tablet (2 mg total) by mouth daily. (Patient not taking: Reported on 04/07/2024) 30 tablet 3 Not Taking   insulin  glargine (LANTUS  SOLOSTAR) 100 UNIT/ML Solostar Pen INJECT 0.15 MLS (15 UNITS TOTAL) INTO THE SKIN AT BEDTIME. (Patient not taking: Reported on 04/07/2024) 15 mL 1 Not Taking   Insulin  Pen Needle (PEN NEEDLES) 31G X 6 MM MISC 1 each by Does not apply route at bedtime. (Patient not taking: Reported on 04/07/2024) 100 each 2 Not Taking   mirtazapine (REMERON) 15 MG tablet Take 15 mg by mouth. (Patient not taking: Reported on 04/07/2024)   Not Taking   sitaGLIPtin  (JANUVIA ) 100 MG tablet Take 1 tablet (100 mg total) by mouth daily. (Patient not taking: Reported on 04/07/2024) 90 tablet 1 Not Taking     Vitals   Vitals:   04/07/24 1830 04/07/24 1900 04/07/24 1915 04/07/24 2020  BP: (!) 108/52 (!) 128/58 123/65 (!) 150/129  Pulse: 96 93 97   Resp: 18 11 20    Temp:    (!) 100.8 F (38.2 C)  TempSrc:    Axillary  SpO2: 98% 98% 97%   Weight:    39.2 kg  Height:    5' 4 (1.626 m)     Body mass  index is 14.83 kg/m.  Physical Exam   General: Laying comfortably in bed; in no acute distress.  HENT: Normal oropharynx and mucosa. Normal external appearance of ears and nose.  Neck: Supple, no pain or tenderness  CV: No JVD. No peripheral edema.  Pulmonary: Symmetric Chest rise. Normal respiratory effort.  Abdomen: Soft to touch, non-tender.  Ext: No cyanosis, edema, or deformity  Skin: No rash. Normal palpation of skin.   Musculoskeletal: Normal digits and nails by inspection. No clubbing.   Neurologic Examination  Mental status/Cognition: Alert, oriented to self, but not to place, month and year, poor attention. Noted underlying cognitive deficits and executive dysfunction. Speech/language: dysarthria, fluent, comprehension intact, object naming intact, repetition intact.  Cranial nerves:   CN II Pupils equal and reactive to light, ?retinopathy as well as inconsistent responses to visual field testing on the right.   CN III,IV,VI EOM intact, no gaze preference or deviation, no nystagmus   CN V normal sensation in V1, V2, and V3 segments bilaterally  CN VII no asymmetry, no nasolabial fold flattening    CN VIII normal hearing to speech    CN IX & X normal palatal elevation, no uvular deviation    CN XI 5/5 head turn and 5/5 shoulder shrug bilaterally    CN XII midline tongue protrusion    Motor:  Muscle bulk: poor, tone: contracture noted in BL uppers Mvmt Root Nerve  Muscle Right Left Comments  SA C5/6 Ax Deltoid     EF C5/6 Mc Biceps 4+ 4+   EE C6/7/8 Rad Triceps 4+ 4+   WF C6/7 Med FCR     WE C7/8 PIN ECU     F Ab C8/T1 U ADM/FDI 4 4   HF L1/2/3 Fem Illopsoas 4 4 ?fasciculations in R calf.  KE L2/3/4 Fem Quad     DF L4/5 D Peron Tib Ant     PF S1/2 Tibial Grc/Sol      Sensation:  Light touch Grossly intact throughout   Pin prick    Temperature    Vibration   Proprioception    Coordination/Complex Motor:  - Finger to Nose intact BL - Heel to shin unable to  do - Rapid alternating movement are slowed throughout - Gait: deferred for patient safety.  Labs   CBC:  Recent Labs  Lab 04/03/24 2146 04/07/24 1152  WBC 5.5 4.4  NEUTROABS 3.1  --   HGB 11.6* 13.6  HCT 36.0* 44.0  MCV 96.5 96.3  PLT 61* 64*    Basic Metabolic Panel:  Lab Results  Component Value Date   NA 142 04/07/2024   K 4.1 04/07/2024   CO2 17 (L) 04/07/2024   GLUCOSE 87 04/07/2024   BUN 31 (H) 04/07/2024   CREATININE 1.81 (H) 04/07/2024   CALCIUM  8.9 04/07/2024   GFRNONAA 36 (L) 04/07/2024   GFRAA 59 (L) 03/31/2020   Lipid Panel:  Lab Results  Component Value Date   LDLCALC 11 07/13/2023   HgbA1c:  Lab Results  Component Value Date   HGBA1C 8.3 (A) 10/15/2023   Urine Drug Screen:     Component Value Date/Time   LABOPIA NONE DETECTED 03/19/2022 1730   COCAINSCRNUR NONE DETECTED 03/19/2022 1730   LABBENZ NONE DETECTED 03/19/2022 1730   AMPHETMU NONE DETECTED 03/19/2022 1730   THCU NONE DETECTED 03/19/2022 1730   LABBARB NONE DETECTED 03/19/2022 1730    Alcohol Level     Component Value Date/Time   ETH <10 03/19/2022 1615   INR  Lab Results  Component Value Date   INR 1.3 (H) 04/07/2024   APTT  Lab Results  Component Value Date   APTT 33 03/19/2022     CT Head without contrast(Personally reviewed): 1. Intraventricular hemorrhage centered in the left choroid plexus with layering hemorrhage in the right greater than left bilateral occipital horns. Increased temporal horn caliber since prior CT may signify hydrocephalus. 2. The above finding was communicated to Dr. Bernardino Barnes by phone at 2:33 PM. 3. Trace right temporal extra-axial hemorrhage compatible with a small subdural hemorrhage. 4. Trace subarachnoid hemorrhage along the left sylvian fissures which may represent redistribution. 5. Decreased size of a 1-2 mm chronic left parietal convexity subdural hematoma. 6. No calvarial fracture.  CT angio Head and Neck with  contrast(Personally reviewed): 1. No large vessel occlusion or aneurysm in the head or neck. 2. Mild atherosclerosis as above. No high-grade stenosis.  MRI Brain(Personally reviewed): 1. Intraventricular hemorrhage involving the left choroid plexus with layering hemorrhage in the right greater  than left bilateral occipital horns. This could be due to an intraventricular tumor/ metastasis, vascular malformation, or hypertension. MRI brain with IV contrast would be useful to further assess. 2. Increased ventricular caliber may signify a component of hydrocephalus. 3. Left transverse dural venous sinus signal abnormality may reflect slow flow, though dural venous sinus thrombosis is not excluded. MRV would be useful to further assess. 4. Questionable foci of restricted diffusion in the left periventricular white matter and left corpus callosum could represent ischemic infarction or cytotoxic/transependymal edema given adjacent choroid plexus hemorrhage. 5. Scattered subarachnoid hemorrhage may represent redistribution of intraventricular hemorrhage. 6. Possible trace right temporal convexity foci of extra-axial hemorrhage. 7. Decreased size of 2 mm left parietal convexity chronic subdural hematoma.  rEEG:  Pending.  Impression   Jacob Barnes is a 83 y.o. male with hx of dementia, HTN, HLD, GERD, DM2, osteoporosis, anemia, thrombocytopenia, prior L SDH, retinopathy, who is brought in to the ED with confusion and generalized weakness.  He was found to have IVH in the L choroid plexus with hemorrhage in BL occipital horns and slight increase in the size of temporal horns concerning for ?mild hydrocephalus.  On exam, he is awake, alert, has R hemianopsia. Chronic contractures in BL hands.  Has baseline cognitive/executive dysfunction and needs help with ADLs but walks with a walker.  Labs with thrombocytopenia with platelet count of 64.  Primary Diagnosis:  Nontraumatic intracerebral  hemorrhage, intraventricular  Recommendations  Acute L choroid plexus IVH in the setting of thrombocytopenia: - Admit to ICU - Stability scan in 6 hours or STAT with any neurological decline - Frequent neuro checks; q62min for 1 hour, then q1hour - No antiplatelets or anticoagulants due to ICH - SCD for DVT prophylaxis, pharmacological DVT ppx at 24 hours if ICH is stable - Blood pressure control with goal systolic 130 - 150, cleverplex and labetalol PRN - Stroke labs, HgbA1c, fasting lipid panel - MRI brain with and without contrast when stabilized to evaluate for underlying mass - MRA without contrast of the brain and Vasc US  carotid duplex to evaluate for underlying vascular abnormality. - Risk factor modification - Echocardiogram - PT consult, OT consult, Speech consult. - Stroke team to follow - platelet transfusion for platelet count below 100k.  DM2: Sliding scale insulin . Carb modified diet.  HTN: - hold home meds. Will use PRN Cleviprex  to keep SBP within goal above.  HLD: - continue home statin. - LDL pending.  Dementia: - continue donepezil . - Check B12, folate, thiamine, TSH. Has lost weight per son and not on multivitamin at home.  ______________________________________________________________________  Allergies verified with son and updated in the chart. Code status verified with son at the bedside and patient is FULL CODE.  This patient is critically ill and at significant risk of neurological worsening, death and care requires constant monitoring of vital signs, hemodynamics,respiratory and cardiac monitoring, neurological assessment, discussion with family, other specialists and medical decision making of high complexity. I spent 50 minutes of neurocritical care time  in the care of  this patient. This was time spent independent of any time provided by nurse practitioner or PA.  Josip Merolla [facility] 04/07/2024  11:20  PM   Signed,  Starling Jessie, MD [facility]     [1]  Allergies Allergen Reactions   Alpha-Gal

## 2024-04-07 NOTE — ED Triage Notes (Signed)
 Patient come in for weakness and fall last night, family denies patient hitting there head, and denies blood thinners. Was seen on christmas day. Altered mental status.

## 2024-04-07 NOTE — ED Provider Notes (Signed)
 " [place] [facility] AT [facility] Provider Note   CSN: [id number] Arrival date & time: 04/07/24  9093     Patient presents with: Weakness, Altered Mental Status, and Fall   Jacob Barnes is a 83 y.o. male.    Weakness Associated symptoms: abdominal pain and chest pain   Altered Mental Status Associated symptoms: abdominal pain and weakness (Generalized)   Fall Associated symptoms include chest pain and abdominal pain.  Patient presents for difficulty with ambulation.  Medical history includes HTN, GERD, CKD, DM, anemia, retinopathy, arthritis.  At baseline, he ambulates with a cane.  He does have short-term memory loss at night.  He stays with his daughter.  Daughter reports that he typically does spend a lot of time in the bed at baseline.  He was seen in the ED 4 days ago after a recent fall at home.  At the time, he was complaining of left flank pain.  He underwent CT scan of chest, abdomen, pelvis.  Results did not show any acute injuries.  There were tree-in-bud opacities in left upper and lower lobes.  Since that ED visit, patient has had ongoing left sided rib pain.  He has also complained of left sided abdominal pain.  Several days ago, his daughter noticed a change in his ability to ambulate.  Patient now reportedly leaning backwards and unsteady on his feet when attempting to walk.  He did have an unwitnessed fall last night.     Prior to Admission medications  Medication Sig Start Date End Date Taking? Authorizing Provider  acetaminophen  (TYLENOL ) 650 MG CR tablet Take 650 mg by mouth.    [provider]  amLODipine  (NORVASC ) 2.5 MG tablet Take 2.5 mg by mouth daily. 02/03/24   [provider]  atorvastatin  (LIPITOR) 10 MG tablet GIVE 1 TABLET BY MOUTH AT BEDTIME EVERY MONDAY/WEDNESDAY/FRIDAY FOR HIGH CHOLESTEROL 10/30/23   [name], MD  blood glucose meter kit and supplies Dispense based on patient and insurance preference.  Use up to two times daily as directed. (FOR ICD-10 E10.9, E11.9). 04/27/22   Therisa Benton PARAS, NP  CALCIUM -VITAMIN D  PO Take by mouth. Patient taking differently: Take by mouth daily at 6 (six) AM.    [provider]  Cholecalciferol (VITAMIN D -3) 25 MCG (1000 UT) CAPS Take by mouth.    [provider]  Continuous Glucose Sensor (FREESTYLE LIBRE 2 SENSOR) MISC Change sensor every 14 days 02/01/23   Therisa Benton PARAS, NP  copper  tablet Take 1 tablet (2 mg total) by mouth daily. 12/14/23   Davonna Siad, MD  donepezil  (ARICEPT ) 10 MG tablet TAKE 1 TABLET BY MOUTH EVERYDAY AT BEDTIME 09/13/23   [name], MD  dorzolamide -timolol  (COSOPT ) 2-0.5 % ophthalmic solution 1 drop 2 (two) times daily. 05/09/22   [provider]  Insulin  Degludec (TRESIBA  [place]) Inject into the skin every other day.    [provider]  insulin  glargine (LANTUS  SOLOSTAR) 100 UNIT/ML Solostar Pen INJECT 0.15 MLS (15 UNITS TOTAL) INTO THE SKIN AT BEDTIME. 01/17/24   Reardon, Benton PARAS, NP  Insulin  Pen Needle (PEN NEEDLES) 31G X 6 MM MISC 1 each by Does not apply route at bedtime. 11/18/21   Therisa Benton PARAS, NP  latanoprost  (XALATAN ) 0.005 % ophthalmic solution 1 drop at bedtime. 05/31/22   [provider]  levETIRAcetam  (KEPPRA ) 500 MG tablet TAKE 1 TABLET BY MOUTH TWICE A DAY 01/21/24   [name], MD  mirtazapine (REMERON)  15 MG tablet Take 15 mg by mouth. 01/14/24   [provider]  Potassium 99 MG TABS Take 99 mg by mouth daily.    [provider]  sitaGLIPtin  (JANUVIA ) 100 MG tablet Take 1 tablet (100 mg total) by mouth daily. 10/15/23   Therisa Benton PARAS, NP  tamsulosin  (FLOMAX ) 0.4 MG CAPS capsule TAKE 1 CAPSULE BY MOUTH EVERY DAY AFTER SUPPER 02/05/24   McKenzie, Belvie CROME, MD  Vitamin D , Ergocalciferol , (DRISDOL ) 1.25 MG (50000 UNIT) CAPS capsule Take 1 capsule (50,000 Units total) by mouth every 7 (seven) days. 02/10/23   [name], MD     Allergies: Alpha-gal    Review of Systems  Cardiovascular:  Positive for chest pain.  Gastrointestinal:  Positive for abdominal pain.  Musculoskeletal:  Positive for gait problem.  Neurological:  Positive for weakness (Generalized).  All other systems reviewed and are negative.   Updated Vital Signs BP (!) 143/78   Pulse 88   Temp 98.7 F (37.1 C) (Oral)   Resp 18   Ht 5' 4 (1.626 m)   Wt 42.3 kg   SpO2 98%   BMI 16.01 kg/m   Physical Exam Vitals and nursing note reviewed.  Constitutional:      General: He is not in acute distress.    Appearance: Normal appearance. He is well-developed. He is not ill-appearing, toxic-appearing or diaphoretic.  HENT:     Head: Normocephalic and atraumatic.     Right Ear: External ear normal.     Left Ear: External ear normal.     Nose: Nose normal.     Mouth/Throat:     Mouth: Mucous membranes are moist.  Eyes:     Extraocular Movements: Extraocular movements intact.     Conjunctiva/sclera: Conjunctivae normal.  Cardiovascular:     Rate and Rhythm: Normal rate and regular rhythm.  Pulmonary:     Effort: Pulmonary effort is normal. No respiratory distress.     Breath sounds: Normal breath sounds.  Chest:     Chest wall: Tenderness present.  Abdominal:     General: There is no distension.     Palpations: Abdomen is soft.     Tenderness: There is abdominal tenderness. There is no guarding or rebound.  Musculoskeletal:        General: No swelling or deformity. Normal range of motion.     Cervical back: Neck supple.  Skin:    General: Skin is warm and dry.  Neurological:     General: No focal deficit present.     Mental Status: He is alert. He is disoriented.     (all labs ordered are listed, but only abnormal results are displayed) Labs Reviewed  COMPREHENSIVE METABOLIC PANEL WITH GFR - Abnormal; Notable for the following components:      Result Value   CO2 17 (*)    BUN 31 (*)    Creatinine, Ser 1.81 (*)    Total  Protein 8.7 (*)    GFR, Estimated 36 (*)    All other components within normal limits  CBC - Abnormal; Notable for the following components:   RDW 16.3 (*)    Platelets 64 (*)    All other components within normal limits  AMMONIA  URINALYSIS, ROUTINE W REFLEX MICROSCOPIC  PROTIME-INR  CBG MONITORING, ED  TROPONIN T, HIGH SENSITIVITY    EKG: EKG Interpretation Date/Time:  Monday April 07 2024 12:41:12 EST Ventricular Rate:  87 PR Interval:  205 QRS Duration:  96 QT  Interval:  384 QTC Calculation: 462 R Axis:   -83  Text Interpretation: Sinus rhythm Left anterior fascicular block Anteroseptal infarct, age indeterminate Confirmed by Melvenia Motto [phone]) on 04/07/2024 1:20:56 PM  Radiology: MR BRAIN WO CONTRAST Result Date: 04/07/2024 EXAM: MRI BRAIN WITHOUT CONTRAST 04/07/2024 01:58:05 PM TECHNIQUE: Multiplanar multisequence MRI of the head/brain was performed without the administration of intravenous contrast. COMPARISON: CT head 07/05/2022 in correlation with subsequently performed CT head 04/07/2024 2:05 pm. CLINICAL HISTORY: Neuro deficit, acute, stroke suspected. FINDINGS: BRAIN AND VENTRICLES: Increased DWI signal with suggestion of corresponding decreased ADC signal in the left periventricular white matter (series 6, image 32). Additional focus of suggested restricted diffusion in the left corpus callosum (series 5, image 28) adjacent to the left lateral ventricle. Mild parenchymal volume loss. Mild to moderate subcortical and periventricular T2 and FLAIR signal hyperintensity likely reflecting sequelae of chronic microvascular ischemia. Intraventricular hemorrhage involving the left choroid plexus. Layering hemorrhage within the right greater than left bilateral occipital horns. Increased ventricular caliber since prior CT. Decreased size of a 2 mm left parietal convexity extra-axial fluid collection. Small foci of FLAIR hyperintensity in the right temporal extra-axial region may  represent small subdural hemorrhage. Susceptibility within the bilateral sylvian fissures and adjacent cerebral sulci in keeping with subarachnoid hemorrhage. Scattered foci of FLAIR hyperintensity and susceptibility within the cerebellar fissures also likely representing foci of subarachnoid hemorrhage. FLAIR hyperintensity involving the left transverse dural venous sinus which may relate to slow flow though thrombosis is not excluded. No midline shift. The sella is unremarkable. Normal flow voids. ORBITS: No acute abnormality. SINUSES AND MASTOIDS: No acute abnormality. BONES AND SOFT TISSUES: Normal marrow signal. No acute soft tissue abnormality. IMPRESSION: 1. Intraventricular hemorrhage involving the left choroid plexus with layering hemorrhage in the right greater than left bilateral occipital horns. This could be due to an intraventricular tumor/ metastasis, vascular malformation, or hypertension. MRI brain with IV contrast would be useful to further assess. 2. Increased ventricular caliber may signify a component of hydrocephalus. 3. Left transverse dural venous sinus signal abnormality may reflect slow flow, though dural venous sinus thrombosis is not excluded. MRV would be useful to further assess. 4. Questionable foci of restricted diffusion in the left periventricular white matter and left corpus callosum could represent ischemic infarction or cytotoxic/transependymal edema given adjacent choroid plexus hemorrhage. 5. Scattered subarachnoid hemorrhage may represent redistribution of intraventricular hemorrhage. 6. Possible trace right temporal convexity foci of extra-axial hemorrhage. 7. Decreased size of 2 mm left parietal convexity chronic subdural hematoma. Electronically signed by: Prentice Spade MD 04/07/2024 03:51 PM EST RP Workstation: [id number]   CT Head Wo Contrast Result Date: 04/07/2024 EXAM: CT HEAD WITHOUT CONTRAST 04/07/2024 02:21:09 PM TECHNIQUE: CT of the head was performed without  the administration of intravenous contrast. Automated exposure control, iterative reconstruction, and/or weight based adjustment of the mA/kV was utilized to reduce the radiation dose to as low as reasonably achievable. COMPARISON: CT head 10/05/2022. MRI brain 04/07/2024 01:41:00 PM. CLINICAL HISTORY: Head trauma, minor (Age >= 65y). FINDINGS: BRAIN AND VENTRICLES: Left middle meningeal artery embolization. Overall similar moderate scattered white matter hypodensities which are nonspecific but most commonly represent chronic microvascular ischemic changes. Significantly decreased size of left cerebral convexity hypoattenuating extra-axial fluid collection now measuring 1 to 2 mm, overlying the parietal lobe. Intraventricular hemorrhage centered in the region of the left choroid plexus measuring up to 2.7 x 2.2 cm in the axial plane with smaller adjacent foci of intraventricular hemorrhage, and layering hemorrhage in  the right greater than left bilateral occipital horns. The temporal horns appear slightly enlarged since prior CT. Trace hyperattenuating foci in the right temporal extra-axial region which may also represent small subdural hemorrhage (series 2, image 38). Trace hyperattenuation within the cerebral sulci along the left sylvian fissure representing scattered subarachnoid hemorrhage. No evidence of acute territorial infarct. No significant mass effect or midline shift. ORBITS: Bilateral lens replacement. SINUSES: No acute abnormality. SOFT TISSUES AND SKULL: No acute soft tissue abnormality. No skull fracture. IMPRESSION: 1. Intraventricular hemorrhage centered in the left choroid plexus with layering hemorrhage in the right greater than left bilateral occipital horns. Increased temporal horn caliber since prior CT may signify hydrocephalus. 2. The above finding was communicated to Dr. Bernardino Fireman by phone at 2:33 PM. 3. Trace right temporal extra-axial hemorrhage compatible with a small subdural  hemorrhage. 4. Trace subarachnoid hemorrhage along the left sylvian fissures which may represent redistribution. 5. Decreased size of a 1-2 mm chronic left parietal convexity subdural hematoma. 6. No calvarial fracture. Electronically signed by: Prentice Spade MD 04/07/2024 03:14 PM EST RP Workstation: [id number]   CT Cervical Spine Wo Contrast Result Date: 04/07/2024 EXAM: CT CERVICAL SPINE WITHOUT CONTRAST 04/07/2024 02:21:09 PM TECHNIQUE: CT of the cervical spine was performed without the administration of intravenous contrast. Multiplanar reformatted images are provided for review. Automated exposure control, iterative reconstruction, and/or weight based adjustment of the mA/kV was utilized to reduce the radiation dose to as low as reasonably achievable. COMPARISON: None available. CLINICAL HISTORY: Neck trauma (Age >= 65y) FINDINGS: BONES AND ALIGNMENT: Cervical spine straightening. No traumatic subluxation. No acute fracture. DEGENERATIVE CHANGES: Moderately advanced disc degeneration from C5 to T1. Mild upper cervical facet arthrosis. Right facet ankylosis at C2-C3. At least mild spinal stenosis at C5-C6, C6-C7, and C7-T1 with up to moderate neural foraminal stenosis. SOFT TISSUES: No prevertebral soft tissue swelling. Chest reported separately. IMPRESSION: 1. No acute cervical spine fracture or traumatic malalignment. Electronically signed by: Dasie Hamburg MD 04/07/2024 03:04 PM EST RP Workstation: [id number]     Procedures   Medications Ordered in the ED  clevidipine  (CLEVIPREX ) infusion 0.5 mg/mL (2 mg/hr Intravenous Rate/Dose Change 04/07/24 1516)  lactated ringers  bolus 500 mL (0 mLs Intravenous Stopped 04/07/24 1453)  iohexol  (OMNIPAQUE ) 350 MG/ML injection 75 mL (75 mLs Intravenous Contrast Given 04/07/24 1401)                                    Medical Decision Making Amount and/or Complexity of Data Reviewed Labs: ordered. Radiology: ordered.  Risk Prescription drug  management. Decision regarding hospitalization.   This patient presents to the ED for concern of fall, difficulty with ambulation, this involves an extensive number of treatment options, and is a complaint that carries with it a high risk of complications and morbidity.  The differential diagnosis includes deconditioning, occult injury, anemia, dehydration, metabolic derangements, CVA, ICH   Co morbidities / Chronic conditions that complicate the patient evaluation  HTN, GERD, CKD, DM, anemia, retinopathy, arthritis   Additional history obtained:  Additional history obtained from EMR External records from outside source obtained and reviewed including patient's son and daughter   Lab Tests:  I Ordered, and personally interpreted labs.  The pertinent results include: Baseline creatinine, normal electrolytes, normal hemoglobin, no leukocytosis   Imaging Studies ordered:  I ordered imaging studies including CT of head and cervical spine; MRI brain I independently visualized and interpreted imaging  which showed intraventricular hemorrhage centered in the left choroid plexus with increased temporal horn caliber suggestive of hydrocephalus; trace right subdural hemorrhage; trace subarachnoid hemorrhage along the left sylvian fissures. I agree with the radiologist interpretation   Cardiac Monitoring: / EKG:  The patient was maintained on a cardiac monitor.  I personally viewed and interpreted the cardiac monitored which showed an underlying rhythm of: Sinus rhythm   Problem List / ED Course / Critical interventions / Medication management  Patient presenting for ongoing chest and abdominal pain over the past week in addition to difficulty in ambulation over the past several days.  On arrival in the ED, patient appears somnolent.  He is easily awakened and is able to answer basic questions.  He is accompanied by his son and daughter who provide history.  Daughter has noticed a change in  his gait which is described as leaning backwards and unsteady.  She reports that he is typically steady with the use of a cane.  Patient does have areas of tenderness on exam.  Per chart review, he did have negative CT scan 5 days ago.  Will repeat imaging today.  Will also assess for possible metabolic causes of his physical decline.  Prior to CT imaging, patient underwent MRI which did show intraventricular hemorrhage around area of left choroid plexus.  It is unclear if this is traumatic or spontaneous.  Although he is normotensive on arrival, patient's blood pressure now in the range of 159 SBP.  Cleviprex  was ordered.  I spoke with urologist on-call, Dr. Matthews, who recommends ICU admission at [facility].  Patient's family was informed of findings and plan.  Patient was admitted for further management. I ordered medication including IV fluid for hydration, Cleviprex  for hypertension in setting of ICH. Reevaluation of the patient after these medicines showed that the patient improved I have reviewed the patients home medicines and have made adjustments as needed   Consultations Obtained:  I requested consultation with the neurologist, Dr. Matthews,  and discussed lab and imaging findings as well as pertinent plan - they recommend: SBP less than 150, admission to neuro ICU   Social Determinants of Health:  Lives at home with daughter  CRITICAL CARE Performed by: Bernardino Fireman   Total critical care time: 32 minutes  Critical care time was exclusive of separately billable procedures and treating other patients.  Critical care was necessary to treat or prevent imminent or life-threatening deterioration.  Critical care was time spent personally by me on the following activities: development of treatment plan with patient and/or surrogate as well as nursing, discussions with consultants, evaluation of patient's response to treatment, examination of patient, obtaining history from patient or  surrogate, ordering and performing treatments and interventions, ordering and review of laboratory studies, ordering and review of radiographic studies, pulse oximetry and re-evaluation of patient's condition.      Final diagnoses:  Intraventricular hemorrhage [facility])    ED Discharge Orders     None          Fireman Bernardino, MD 04/07/24 1558  "

## 2024-04-08 ENCOUNTER — Inpatient Hospital Stay (HOSPITAL_COMMUNITY)

## 2024-04-08 ENCOUNTER — Encounter (HOSPITAL_COMMUNITY): Payer: Self-pay | Admitting: Neurology

## 2024-04-08 DIAGNOSIS — L8990 Pressure ulcer of unspecified site, unspecified stage: Secondary | ICD-10-CM | POA: Insufficient documentation

## 2024-04-08 DIAGNOSIS — R29704 NIHSS score 4: Secondary | ICD-10-CM

## 2024-04-08 DIAGNOSIS — I3139 Other pericardial effusion (noninflammatory): Secondary | ICD-10-CM

## 2024-04-08 DIAGNOSIS — R569 Unspecified convulsions: Secondary | ICD-10-CM

## 2024-04-08 DIAGNOSIS — E119 Type 2 diabetes mellitus without complications: Secondary | ICD-10-CM

## 2024-04-08 DIAGNOSIS — I10 Essential (primary) hypertension: Secondary | ICD-10-CM

## 2024-04-08 DIAGNOSIS — I615 Nontraumatic intracerebral hemorrhage, intraventricular: Secondary | ICD-10-CM | POA: Diagnosis not present

## 2024-04-08 DIAGNOSIS — Z794 Long term (current) use of insulin: Secondary | ICD-10-CM

## 2024-04-08 DIAGNOSIS — I619 Nontraumatic intracerebral hemorrhage, unspecified: Secondary | ICD-10-CM

## 2024-04-08 DIAGNOSIS — D696 Thrombocytopenia, unspecified: Secondary | ICD-10-CM | POA: Diagnosis not present

## 2024-04-08 LAB — GLUCOSE, CAPILLARY
Glucose-Capillary: 106 mg/dL — ABNORMAL HIGH (ref 70–99)
Glucose-Capillary: 101 mg/dL — ABNORMAL HIGH (ref 70–99)
Glucose-Capillary: 157 mg/dL — ABNORMAL HIGH (ref 70–99)
Glucose-Capillary: 324 mg/dL — ABNORMAL HIGH (ref 70–99)
Glucose-Capillary: 266 mg/dL — ABNORMAL HIGH (ref 70–99)
Glucose-Capillary: 215 mg/dL — ABNORMAL HIGH (ref 70–99)

## 2024-04-08 LAB — FOLATE: Folate: 14 ng/mL

## 2024-04-08 LAB — TSH: TSH: 1.08 u[IU]/mL (ref 0.350–4.500)

## 2024-04-08 LAB — ECHOCARDIOGRAM COMPLETE
Area-P 1/2: 3.91 cm2
Height: 64 in
S' Lateral: 2.3 cm
Weight: 1382.73 [oz_av]

## 2024-04-08 LAB — TYPE AND SCREEN
ABO/RH(D): O POS
Antibody Screen: NEGATIVE

## 2024-04-08 LAB — MRSA NEXT GEN BY PCR, NASAL: MRSA by PCR Next Gen: DETECTED — AB

## 2024-04-08 LAB — VITAMIN B12: Vitamin B-12: 1474 pg/mL — ABNORMAL HIGH (ref 180–914)

## 2024-04-08 MED ORDER — ENSURE PLUS HIGH PROTEIN PO LIQD
237.0000 mL | Freq: Two times a day (BID) | ORAL | Status: DC
  Administered 2024-04-08 – 2024-04-20 (×19): 237 mL via ORAL

## 2024-04-08 MED ORDER — TAMSULOSIN HCL 0.4 MG PO CAPS
0.4000 mg | ORAL_CAPSULE | Freq: Every day | ORAL | Status: DC
  Administered 2024-04-08 – 2024-04-20 (×13): 0.4 mg via ORAL
  Filled 2024-04-08 (×13): qty 1

## 2024-04-08 MED ORDER — GADOBUTROL 1 MMOL/ML IV SOLN
4.0000 mL | Freq: Once | INTRAVENOUS | Status: AC | PRN
  Administered 2024-04-08: 4 mL via INTRAVENOUS

## 2024-04-08 MED ORDER — ORAL CARE MOUTH RINSE
15.0000 mL | OROMUCOSAL | Status: DC
  Administered 2024-04-08 – 2024-04-21 (×53): 15 mL via OROMUCOSAL

## 2024-04-08 MED ORDER — AMLODIPINE BESYLATE 2.5 MG PO TABS
2.5000 mg | ORAL_TABLET | Freq: Every day | ORAL | Status: DC
  Administered 2024-04-08 – 2024-04-19 (×5): 2.5 mg via ORAL
  Filled 2024-04-08 (×13): qty 1

## 2024-04-08 MED ORDER — ORAL CARE MOUTH RINSE: 15.0000 mL | OROMUCOSAL | Status: DC | PRN

## 2024-04-08 MED ORDER — LABETALOL HCL 5 MG/ML IV SOLN: 10.0000 mg | INTRAVENOUS | Status: DC | PRN

## 2024-04-08 NOTE — Progress Notes (Addendum)
 STROKE TEAM PROGRESS NOTE    SIGNIFICANT HOSPITAL EVENTS  12/29: Presented to ED with confusion and generalized weakness.   CT head demonstrated IVH. NIH 5.   INTERIM HISTORY/SUBJECTIVE  Patient sitting up in chair.  RN at bedside.  No family at bedside. Patient is disoriented to place, time.   Mild dysarthria present. Chronic bilateral hand contractures present with right middle finger amputated.   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 Cardiology consulted.  EEG shows mild diffuse slowing.  No seizure activity OBJECTIVE  CBC    Component Value Date/Time   WBC 4.4 04/07/2024 1152   RBC 4.57 04/07/2024 1152   HGB 13.6 04/07/2024 1152   HGB 9.3 (L) 07/13/2023 1645   HCT 44.0 04/07/2024 1152   HCT 28.6 (L) 07/13/2023 1645   PLT 64 (L) 04/07/2024 1152   PLT 127 (L) 07/13/2023 1645   MCV 96.3 04/07/2024 1152   MCV 94 07/13/2023 1645   MCH 29.8 04/07/2024 1152   MCHC 30.9 04/07/2024 1152   RDW 16.3 (H) 04/07/2024 1152   RDW 14.5 07/13/2023 1645   LYMPHSABS 1.3 04/03/2024 2146   LYMPHSABS 2.4 06/21/2022 1641   MONOABS 1.0 04/03/2024 2146   EOSABS 0.0 04/03/2024 2146   EOSABS 0.0 06/21/2022 1641   BASOSABS 0.0 04/03/2024 2146   BASOSABS 0.0 06/21/2022 1641    BMET    Component Value Date/Time   NA 142 04/07/2024 1047   NA 136 07/13/2023 1645   K 4.1 04/07/2024 1047   CL 110 04/07/2024 1047   CO2 17 (L) 04/07/2024 1047   GLUCOSE 87 04/07/2024 1047   BUN 31 (H) 04/07/2024 1047   BUN 16 07/13/2023 1645   CREATININE 1.81 (H) 04/07/2024 1047   CREATININE 1.36 (H) 09/30/2019 1008   CALCIUM  8.9 04/07/2024 1047   EGFR 57 (L) 07/13/2023 1645   GFRNONAA 36 (L) 04/07/2024 1047   GFRNONAA 49 (L) 09/30/2019 1008    IMAGING past 24 hours EEG adult Result Date: 04/08/2024 Shelton Arlin KIDD, MD     04/08/2024  9:34 AM Patient Name: Jacob Barnes MRN: [id number] Epilepsy Attending: Arlin KIDD Shelton Referring Physician/Provider: Vanessa Robert, MD Date:  04/08/2024 Duration: 23.20 mins Patient history: 83yo M with acute L choroid plexus IVH in the setting of thrombocytopenia. EEG to evaluate for seizure Level of alertness: Awake, asleep AEDs during EEG study: LEV Technical aspects: This EEG study was done with scalp electrodes positioned according to the 10-20 International system of electrode placement. Electrical activity was reviewed with band pass filter of 1-70Hz , sensitivity of 7 uV/mm, display speed of 52mm/sec with a 60Hz  notched filter applied as appropriate. EEG data were recorded continuously and digitally stored.  Video monitoring was available and reviewed as appropriate. Description: The posterior dominant rhythm consists of 8-9Hz  activity of moderate voltage (25-35 uV) seen predominantly in posterior head regions, symmetric and reactive to eye opening and eye closing. Sleep was characterized by vertex waves, sleep spindles (12 to 14 Hz), maximal frontocentral region.  There is intermittent generalized 3 to 6 Hz theta-delta slowing. Hyperventilation and photic stimulation were not performed.   ABNORMALITY - Intermittent slow, generalized IMPRESSION: This study is suggestive of mild diffuse encephalopathy. No seizures or epileptiform discharges were seen throughout the recording. Jacob Barnes   MR BRAIN W WO CONTRAST Result Date: 04/08/2024 EXAM: MRI BRAIN WITH AND WITHOUT CONTRAST 04/08/2024 04:47:00 AM TECHNIQUE: Multiplanar multisequence MRI of the head/brain was performed with and without the administration of intravenous contrast.  CONTRAST: 4 mL of Gadavist . COMPARISON: MR Head 04/07/2024. CLINICAL HISTORY: Neuro deficit, acute, stroke suspected. FINDINGS: BRAIN AND VENTRICLES: No acute infarct. There has been decrease of intraventricular hemorrhage within the posterior horn of the right lateral ventricle. There has been mild interval worsening of diffuse subarachnoid hemorrhage, likely from redistribution. The volume of intraventricular  hemorrhage within the posterior horn of the left lateral ventricle has not changed significantly. No mass effect or midline shift. No hydrocephalus. The sella is unremarkable. Normal flow voids. No mass or abnormal enhancement. There is age-related atrophy and moderately advanced cerebral white matter disease. This study is degraded by patient motion. ORBITS: No acute abnormality. SINUSES: No acute abnormality. BONES AND SOFT TISSUES: Normal bone marrow signal and enhancement. No acute soft tissue abnormality. IMPRESSION: 1. Mild interval worsening of diffuse subarachnoid hemorrhage, likely from redistribution. 2. Decreased intraventricular hemorrhage within the posterior horn of the right lateral ventricle. 3. Stable intraventricular hemorrhage within the posterior horn of the left lateral ventricle. 4. No definite evidence of underlying mass. 5. Age-related atrophy and moderately advanced cerebral white matter disease. 6. Motion degraded examination. Electronically signed by: Evalene Coho MD 04/08/2024 06:35 AM EST RP Workstation: [id number]   CT VENOGRAM HEAD Result Date: 04/07/2024 EXAM: CTA Head with Intravenous Contrast CLINICAL HISTORY: Dural venous sinus thrombosis suspected. TECHNIQUE: Axial CTA images of the head with intravenous contrast. MIP reconstructed images were created and reviewed. Dose reduction technique was used including one or more of the following: automated exposure control, adjustment of mA and kV according to patient size, and/or iterative reconstruction. CONTRAST: With; 75 mL (iohexol  (OMNIPAQUE ) 350 MG/ML injection 75 mL IOHEXOL  350 MG/ML SOLN). COMPARISON: 04/07/2024 FINDINGS: INTERNAL CAROTID ARTERIES: The intracranial ICAs are patent with no significant stenosis. No occlusion. No aneurysm. ANTERIOR CEREBRAL ARTERIES: No significant stenosis. No occlusion. No aneurysm. MIDDLE CEREBRAL ARTERIES: No significant stenosis. No occlusion. No aneurysm. POSTERIOR CEREBRAL ARTERIES: No  significant stenosis. No occlusion. No aneurysm. BASILAR ARTERY: No significant stenosis. No occlusion. No aneurysm. VERTEBRAL ARTERIES: No significant stenosis. No occlusion. No aneurysm. SOFT TISSUES: No acute finding. No masses or lymphadenopathy. BONES: No acute osseous abnormality. BRAIN: Unchanged appearance of intraventricular hematoma within the left lateral ventricle atrium. IMPRESSION: 1. No evidence of dural venous sinus thrombosis. 2. Unchanged appearance of intraventricular hematoma within the left lateral ventricle atrium. Electronically signed by: Franky Stanford MD 04/07/2024 10:57 PM EST RP Workstation: [id number]   CT HEAD POST STROKE FOLLOWUP/TIMED/STAT READ Result Date: 04/07/2024 EXAM: CT HEAD WITHOUT 04/07/2024 10:39:48 PM TECHNIQUE: CT of the head was performed without the administration of intravenous contrast. Automated exposure control, iterative reconstruction, and/or weight based adjustment of the mA/kV was utilized to reduce the radiation dose to as low as reasonably achievable. COMPARISON: 04/07/2024 CLINICAL HISTORY: Neuro deficit, acute, stroke suspected FINDINGS: BRAIN AND VENTRICLES: Unchanged hemorrhage within the left lateral ventricle. Unchanged mild communicating hydrocephalus. Resolution of old left sided subdural hematoma. No new site of hemorrhage. No evidence of acute infarct. ORBITS: No acute abnormality. SINUSES AND MASTOIDS: No acute abnormality. SOFT TISSUES AND SKULL: No acute skull fracture. No acute soft tissue abnormality. IMPRESSION: 1. Unchanged hemorrhage within the left lateral ventricle. 2. Unchanged mild communicating hydrocephalus. Electronically signed by: Franky Stanford MD 04/07/2024 10:55 PM EST RP Workstation: [id number]   CT ANGIO HEAD NECK W WO CM Result Date: 04/07/2024 EXAM: CTA HEAD AND NECK WITH AND WITHOUT 04/07/2024 06:15:51 PM TECHNIQUE: CTA of the head and neck was performed with and without the administration of intravenous contrast. 60  mL  (iohexol  (OMNIPAQUE ) 350 MG/ML injection 60 mL IOHEXOL  350 MG/ML SOLN) was administered. Multiplanar 2D and/or 3D reformatted images are provided for review. Automated exposure control, iterative reconstruction, and/or weight based adjustment of the mA/kV was utilized to reduce the radiation dose to as low as reasonably achievable. Stenosis of the internal carotid arteries measured using NASCET criteria. COMPARISON: CT head and CT cervical spine dated 04/07/2024. CTA head and neck dated 03/19/2022. CLINICAL HISTORY: Stroke, hemorrhagic. FINDINGS: CTA NECK: AORTIC ARCH AND ARCH VESSELS: Atherosclerosis at the left subclavian artery origin without significant stenosis. Atherosclerosis of the partially visualized aortic arch. No dissection or arterial injury. No significant stenosis of the brachiocephalic arteries. CERVICAL CAROTID ARTERIES: Minimal atherosclerosis along the proximal right internal carotid artery without stenosis. Mild tortuosity of the distal right cervical ICA. Atherosclerosis at the left carotid bifurcation without hemodynamically significant stenosis. No dissection or arterial injury. CERVICAL VERTEBRAL ARTERIES: The right vertebral artery is dominant. Extradural origin of the right PICA. Diminished post PICA segment of the nondominant left vertebral artery. No dissection, arterial injury, or significant stenosis. LUNGS AND MEDIASTINUM: Unremarkable. SOFT TISSUES: Edentulous maxilla and mandible. No acute abnormality. BONES: Degenerative changes in the visualized spine. No acute abnormality. CTA HEAD: ANTERIOR CIRCULATION: Mild atherosclerosis of the carotid siphons. There is mild stenosis of the right cavernous ICA. Tortuosity of the cavernous ICAs bilaterally, greater on the right. No significant stenosis of the anterior cerebral arteries. No significant stenosis of the middle cerebral arteries. No aneurysm. POSTERIOR CIRCULATION: There is distal tapering of the basilar artery. Small P1 segments  noted bilaterally. The posterior cerebral arteries are primarily supplied via the posterior communicating arteries. No significant stenosis of the posterior cerebral arteries. No significant stenosis of the basilar artery. No significant stenosis of the vertebral arteries. No aneurysm. OTHER: No dural venous sinus thrombosis on this non-dedicated study. IMPRESSION: 1. No large vessel occlusion or aneurysm in the head or neck. 2. Mild atherosclerosis as above. No high-grade stenosis. Electronically signed by: Donnice Mania MD 04/07/2024 08:03 PM EST RP Workstation: [id number]   MR BRAIN WO CONTRAST Result Date: 04/07/2024 EXAM: MRI BRAIN WITHOUT CONTRAST 04/07/2024 01:58:05 PM TECHNIQUE: Multiplanar multisequence MRI of the head/brain was performed without the administration of intravenous contrast. COMPARISON: CT head 07/05/2022 in correlation with subsequently performed CT head 04/07/2024 2:05 pm. CLINICAL HISTORY: Neuro deficit, acute, stroke suspected. FINDINGS: BRAIN AND VENTRICLES: Increased DWI signal with suggestion of corresponding decreased ADC signal in the left periventricular white matter (series 6, image 32). Additional focus of suggested restricted diffusion in the left corpus callosum (series 5, image 28) adjacent to the left lateral ventricle. Mild parenchymal volume loss. Mild to moderate subcortical and periventricular T2 and FLAIR signal hyperintensity likely reflecting sequelae of chronic microvascular ischemia. Intraventricular hemorrhage involving the left choroid plexus. Layering hemorrhage within the right greater than left bilateral occipital horns. Increased ventricular caliber since prior CT. Decreased size of a 2 mm left parietal convexity extra-axial fluid collection. Small foci of FLAIR hyperintensity in the right temporal extra-axial region may represent small subdural hemorrhage. Susceptibility within the bilateral sylvian fissures and adjacent cerebral sulci in keeping with  subarachnoid hemorrhage. Scattered foci of FLAIR hyperintensity and susceptibility within the cerebellar fissures also likely representing foci of subarachnoid hemorrhage. FLAIR hyperintensity involving the left transverse dural venous sinus which may relate to slow flow though thrombosis is not excluded. No midline shift. The sella is unremarkable. Normal flow voids. ORBITS: No acute abnormality. SINUSES AND MASTOIDS: No acute abnormality. BONES AND SOFT  TISSUES: Normal marrow signal. No acute soft tissue abnormality. IMPRESSION: 1. Intraventricular hemorrhage involving the left choroid plexus with layering hemorrhage in the right greater than left bilateral occipital horns. This could be due to an intraventricular tumor/ metastasis, vascular malformation, or hypertension. MRI brain with IV contrast would be useful to further assess. 2. Increased ventricular caliber may signify a component of hydrocephalus. 3. Left transverse dural venous sinus signal abnormality may reflect slow flow, though dural venous sinus thrombosis is not excluded. MRV would be useful to further assess. 4. Questionable foci of restricted diffusion in the left periventricular white matter and left corpus callosum could represent ischemic infarction or cytotoxic/transependymal edema given adjacent choroid plexus hemorrhage. 5. Scattered subarachnoid hemorrhage may represent redistribution of intraventricular hemorrhage. 6. Possible trace right temporal convexity foci of extra-axial hemorrhage. 7. Decreased size of 2 mm left parietal convexity chronic subdural hematoma. Electronically signed by: Prentice Spade MD 04/07/2024 03:51 PM EST RP Workstation: [id number]   CT Head Wo Contrast Result Date: 04/07/2024 EXAM: CT HEAD WITHOUT CONTRAST 04/07/2024 02:21:09 PM TECHNIQUE: CT of the head was performed without the administration of intravenous contrast. Automated exposure control, iterative reconstruction, and/or weight based adjustment of  the mA/kV was utilized to reduce the radiation dose to as low as reasonably achievable. COMPARISON: CT head 10/05/2022. MRI brain 04/07/2024 01:41:00 PM. CLINICAL HISTORY: Head trauma, minor (Age >= 65y). FINDINGS: BRAIN AND VENTRICLES: Left middle meningeal artery embolization. Overall similar moderate scattered white matter hypodensities which are nonspecific but most commonly represent chronic microvascular ischemic changes. Significantly decreased size of left cerebral convexity hypoattenuating extra-axial fluid collection now measuring 1 to 2 mm, overlying the parietal lobe. Intraventricular hemorrhage centered in the region of the left choroid plexus measuring up to 2.7 x 2.2 cm in the axial plane with smaller adjacent foci of intraventricular hemorrhage, and layering hemorrhage in the right greater than left bilateral occipital horns. The temporal horns appear slightly enlarged since prior CT. Trace hyperattenuating foci in the right temporal extra-axial region which may also represent small subdural hemorrhage (series 2, image 38). Trace hyperattenuation within the cerebral sulci along the left sylvian fissure representing scattered subarachnoid hemorrhage. No evidence of acute territorial infarct. No significant mass effect or midline shift. ORBITS: Bilateral lens replacement. SINUSES: No acute abnormality. SOFT TISSUES AND SKULL: No acute soft tissue abnormality. No skull fracture. IMPRESSION: 1. Intraventricular hemorrhage centered in the left choroid plexus with layering hemorrhage in the right greater than left bilateral occipital horns. Increased temporal horn caliber since prior CT may signify hydrocephalus. 2. The above finding was communicated to Dr. Bernardino Fireman by phone at 2:33 PM. 3. Trace right temporal extra-axial hemorrhage compatible with a small subdural hemorrhage. 4. Trace subarachnoid hemorrhage along the left sylvian fissures which may represent redistribution. 5. Decreased size of a 1-2  mm chronic left parietal convexity subdural hematoma. 6. No calvarial fracture. Electronically signed by: Prentice Spade MD 04/07/2024 03:14 PM EST RP Workstation: [id number]   CT Cervical Spine Wo Contrast Result Date: 04/07/2024 EXAM: CT CERVICAL SPINE WITHOUT CONTRAST 04/07/2024 02:21:09 PM TECHNIQUE: CT of the cervical spine was performed without the administration of intravenous contrast. Multiplanar reformatted images are provided for review. Automated exposure control, iterative reconstruction, and/or weight based adjustment of the mA/kV was utilized to reduce the radiation dose to as low as reasonably achievable. COMPARISON: None available. CLINICAL HISTORY: Neck trauma (Age >= 65y) FINDINGS: BONES AND ALIGNMENT: Cervical spine straightening. No traumatic subluxation. No acute fracture. DEGENERATIVE CHANGES: Moderately advanced disc degeneration  from C5 to T1. Mild upper cervical facet arthrosis. Right facet ankylosis at C2-C3. At least mild spinal stenosis at C5-C6, C6-C7, and C7-T1 with up to moderate neural foraminal stenosis. SOFT TISSUES: No prevertebral soft tissue swelling. Chest reported separately. IMPRESSION: 1. No acute cervical spine fracture or traumatic malalignment. Electronically signed by: Dasie Hamburg MD 04/07/2024 03:04 PM EST RP Workstation: [id number]    Vitals:   04/08/24 0715 04/08/24 0730 04/08/24 0745 04/08/24 0800  BP: (!) 138/93 (!) 149/89 (!) 135/106 (!) 123/91  Pulse: 80 79 84 97  Resp: 18 13 13 17   Temp:    98.4 F (36.9 C)  TempSrc:    Axillary  SpO2: 99% 99% 100% 96%  Weight:      Height:       PHYSICAL EXAM General:  Alert, elderly, thin patient in no acute distress Psych:  Mood and affect appropriate for situation CV: Regular rate and rhythm on monitor Respiratory:  Regular, unlabored respirations on room air GI: Abdomen soft and nontender  NEURO:  Mental Status: Awake, alert.  Oriented to self, age.  Disoriented to place and time.  Cranial  Nerves:  II: PERRL.  Blinks to threat bilaterally.  Inconsistent responses for visual field testing. III, IV, VI: EOMI. Eyelids elevate symmetrically.  V: Sensation is intact to light touch and symmetrical to face.  VII: Face is symmetrical resting and smiling VIII: hearing intact to voice. IX, X: Palate elevates symmetrically.  Mild dysarthria KP:Dynloizm shrug 5/5. XII: tongue is midline without fasciculations. Motor: 4+/5 throughout.  Generalized weakness Tone: is normal and bulk is normal Sensation- Intact to light touch bilaterally. Extinction absent to light touch to DSS.   Coordination: FTN intact bilaterally, chronic bilateral hand contractures present Gait- deferred  Most Recent NIH 4   ASSESSMENT/PLAN  [name] is a 83 y.o. male with history of HTN, HLD, DM2, iron -deficiency anemia, thrombocytopenia, prior left subdural, retinopathy, GERD who presented to ED due to confusion and generalized weakness.  Patient's daughter states that he is able to ambulate with his cane or walker at baseline.  Family also had concerns for an unwitnessed fall and increased confusion since around Christmas.  CT head demonstrated IVH centered in left choroid plexus with layering hemorrhage in bilateral occipital lobes.  Admitted to ICU for close monitoring NIH on Admission 5  Nontraumatic intracerebral hemorrhage, intraventricular Etiology: Secondary to thrombocytopenia,?  Unwitnessed fall CT head  Intraventricular hemorrhage centered in the left choroid plexus with layering hemorrhage in the right greater than left bilateral occipital horns. Increased temporal horn caliber since prior CT may signify hydrocephalus. Trace right temporal extra-axial hemorrhage compatible with a small subdural hemorrhage. Trace subarachnoid hemorrhage along the left sylvian fissures which may represent redistribution. Decreased size of a 1-2 mm chronic left parietal convexity subdural hematoma. CTA head & neck   No large vessel occlusion or aneurysm in the head or neck. Mild atherosclerosis as above. No high-grade stenosis. MRI   Intraventricular hemorrhage involving the left choroid plexus with layering hemorrhage in the right greater than left bilateral occipital horns. This could be due to an intraventricular tumor/ metastasis, vascular malformation, or hypertension. MRI brain with IV contrast would be useful to further assess. Increased ventricular caliber may signify a component of hydrocephalus. Left transverse dural venous sinus signal abnormality may reflect slow flow, though dural venous sinus thrombosis is not excluded. MRV would be useful to further assess. Questionable foci of restricted diffusion in the left periventricular white matter and left  corpus callosum could represent ischemic infarction or cytotoxic/transependymal edema given adjacent choroid plexus hemorrhage. Scattered subarachnoid hemorrhage may represent redistribution of intraventricular hemorrhage. Possible trace right temporal convexity foci of extra-axial hemorrhage. Decreased size of 2 mm left parietal convexity chronic subdural hematoma. EEG:  Intermittent slow, generalized This study is suggestive of mild diffuse encephalopathy. No seizures or epileptiform discharges were seen throughout the recording. 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  CT scan on admission showed left cerebral subdural hematoma with mass effect 4 - 5 millimeters rightward midline shift.   Repeat CT scan stable.   S/p successful Onyx embolization of left middle  meningeal artery 12/13 by neurosurgery.  No further follow-ups seen on chart review Home meds: Keppra  50mg  BID prophylaxis, continued  Hypertension Home meds: none (per family, 2.5mg  Norvasc  was discontinued by provider) Stable Blood Pressure Goal: SBP between 130-150 for 24 hours and then less than 160   Hyperlipidemia Home meds: Lipitor 10 mg LDL 11, goal < 70 Continue statin at discharge.  Continued management per PCP  Diabetes type II Uncontrolled Home meds: Insulin  Januvia  discontinued by provider per family HgbA1c 8.3, goal < 7.0 Continue CBGs and SSI Recommend close follow-up with PCP for better DM control  Other Stroke Risk Factors Family hx stroke (mother)  Other Active Problems Concern for Malnutrition BMI 14.3 Supplemental shakes added RD consult added Hx of Anemia, Pancytopenia Continue daily labs Followed outpatient by Hem/Onc  Hospital day # 1   Pt seen by Neuro NP/APP with MD. Note/plan to be edited by MD as needed.    Rocky JAYSON Likes, DNP [facility] Please use AMION for contact information & EPIC for messaging.  I have personally obtained history,examined this patient, reviewed notes, independently viewed imaging studies, participated in medical decision making and plan of care.ROS completed by me personally and pertinent positives fully documented  I have made any additions or clarifications directly to the above note. Agree with note above.  Patient neurologically stable.  Blood pressure adequately controlled.  Repeat CBC is pending.  Platelet counts remain low in the 60,000 range.  Echocardiogram suggest cardiac amyloid and will need cardiology consult.  Continue strict blood pressure control with systolic goal below 160.  Mobilize out of bed.  Therapy consults.  Last medical hospitalist team to resume care after patient is ICU. This patient is critically ill and at significant risk of neurological worsening, death and care requires constant  monitoring of vital signs, hemodynamics,respiratory and cardiac monitoring, extensive review of multiple databases, frequent neurological assessment, discussion with family, other specialists and medical decision making of high complexity.I have made any additions or clarifications directly to the above note.This critical care time does not reflect procedure time, or teaching time or supervisory time of PA/NP/Med Resident etc but could involve care discussion time.  I spent 30 minutes of neurocritical care time  in the care of  this patient.           Eather Popp, MD Medical Director [facility] Pager: [phone] 04/08/2024 4:19 PM  To contact Stroke Continuity provider, please refer to [email or web address]. [id number] [phone], contact [facility]

## 2024-04-08 NOTE — Consult Note (Addendum)
 "  Cardiology Consultation  Patient ID: CELESTE CANDELAS MRN: [id number]; DOB: [birth year]  Admit date: 04/07/2024 Date of Consult: 04/08/2024  PCP:  [name], MD   [facility] Providers Cardiologist:  New  Patient Profile: OLVIN ROHR is a 83 y.o. male with a hx of hypertension, hyperlipidemia, GERD, type 2 diabetes, anemia, osteoporosis, anemia, thrombocytopenia, prior left subdural hemorrhage, retinopathy,  who is being seen 04/08/2024 for the evaluation of abnormal echocardiogram at the request of Dr.Khaliqdina .  History of Present Illness: Mr. Hernan has past medical history as stated above. He presented to the [facility] ER on 04/07/2024 for weakness, AMS, fall. He reported that at his baseline he can typically ambulate with the use of a cane. He had recently been seen in the ER 12/25 for a fall, he received CT of chest, abdomen, pelvis which did not show any acute abnormalities. He had been complaining of left-sided abdominal pain since discharge.   His daughter, who he lives with, then noticed his change in ability to ambulate. He was leaning backwards and remained unsteady on his feet. He then suffered another unwitnessed fall.   CT head as well as brain MRI were obtained which showed intraventricular hemorrhage, possible hydrocephalus, trace subdural hemorrhage. He was admitted to the hospital by neurology.   Echocardiogram was obtained during their routine workup. 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   There are no prior echocardiograms in his chart to compare. Patient has not previously been seen by cardiology as an outpatient.   Upon my examination of the patient, he is in the room by himself. He is very pleasant but very disoriented. He is unable to tell  me even his name or DOB. He is unsure of what brought him in here and cannot provide any history. He is resting comfortably in the bed and has no acute complaints.   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   Anemia    Arthritis    hands   Carpal tunnel syndrome 08/05/2009   Qualifier: Diagnosis of  By: [name], [name]     Cataract 2017   Chronic kidney disease    Diabetes mellitus without complication (HCC)    Phreesia 04/03/2020   ERECTILE DYSFUNCTION, ORGANIC 01/20/2009   Qualifier: Diagnosis of  By: Antonetta MD, Margaret     GERD (gastroesophageal reflux disease)    Glaucoma    both eyes   History of kidney stones    Hyperlipemia    Hypertension    Iron  deficiency anemia 01/10/2014   Osteoporosis September 2021   Other pancytopenia (HCC) 01/10/2014   New in 01/2014, undergoing pathology review    Rash    front abdomen no drainage   Past Surgical History:  Procedure Laterality Date   AMPUTATION Right 09/01/2016   Procedure: AMPUTATION RIGHT MIDDLE FINGER TIP;  Surgeon: [name], MD;  Location: [facility];  Service: Orthopedics;  Laterality: Right;   AMPUTATION Right 10/10/2016   Procedure: REPEAT IRRIGATION AND DEBRIDEMENT RIGHT MIDDLE FINGER WITH AMPUTATION THROUGH PROXIMAL PHALANX;  Surgeon: [name], MD;  Location: [facility] OR;  Service: Orthopedics;  Laterality: Right;   APPENDECTOMY     BIOPSY  01/13/2022   Procedure: BIOPSY;  Surgeon: [name], MD;  Location:  [facility];  Service: Gastroenterology;;   COLONOSCOPY  09/06/2011   Procedure: COLONOSCOPY;  Surgeon: [name], MD;  Location: [facility];  Service: Endoscopy;  Laterality: N/A;  830   COLONOSCOPY WITH PROPOFOL  N/A 01/13/2022   Procedure: COLONOSCOPY WITH PROPOFOL ;  Surgeon: [name], MD;  Location: [facility];  Service: Gastroenterology;  Laterality: N/A;  200 ASA 3    CYSTOSCOPY/RETROGRADE/URETEROSCOPY/STONE EXTRACTION WITH BASKET     ESOPHAGOGASTRODUODENOSCOPY N/A 01/15/2014   Procedure: ESOPHAGOGASTRODUODENOSCOPY (EGD);  Surgeon: [name], MD;  Location: [facility];  Service: Endoscopy;  Laterality: N/A;  200   ESOPHAGOGASTRODUODENOSCOPY N/A 04/22/2014   Procedure: ESOPHAGOGASTRODUODENOSCOPY (EGD);  Surgeon: [name], MD;  Location: [facility];  Service: Endoscopy;  Laterality: N/A;  240   EXTRACORPOREAL SHOCK WAVE LITHOTRIPSY Left 01/02/2023   Procedure: EXTRACORPOREAL SHOCK WAVE LITHOTRIPSY (ESWL);  Surgeon: [name], MD;  Location: [facility];  Service: Urology;  Laterality: Left;   EYE SURGERY Bilateral yrs ago   ioc for cataract   IR ANGIO EXTERNAL CAROTID SEL EXT CAROTID UNI L MOD SED  03/22/2022   IR ANGIO INTRA EXTRACRAN SEL INTERNAL CAROTID UNI L MOD SED  03/26/2022   IR ANGIOGRAM FOLLOW UP STUDY  03/26/2022   IR NEURO EACH ADD'L AFTER BASIC UNI LEFT (MS)  03/26/2022   IR TRANSCATH/EMBOLIZ  03/26/2022   MALONEY DILATION N/A 01/15/2014   Procedure: AGAPITO DILATION;  Surgeon: [name], MD;  [facility];  Service: Endoscopy;  Laterality: N/A;   MALONEY DILATION  04/22/2014   Procedure: AGAPITO DILATION;  Surgeon: [name], MD;  [facility]: [facility];  Service: Endoscopy;;   POLYPECTOMY  01/13/2022   Procedure: POLYPECTOMY;  Surgeon: [name], [name], MD;  Location: [facility];  Service: Gastroenterology;;   RADIOLOGY WITH ANESTHESIA Left 03/22/2022   Procedure: LEFT MMA EMBOLIZATION;  Surgeon: Lanis Pupa, MD;  [facility];  Service: Radiology;  Laterality: Left;   surgery for left eye bleedf  2017    Home Medications:  Prior to Admission medications  Medication Sig Start Date End Date Taking? Authorizing Provider  acetaminophen  (TYLENOL ) 650 MG CR tablet Take 650 mg by mouth every 8 (eight) hours as needed for pain.   Yes [provider]  atorvastatin  (LIPITOR)  10 MG tablet GIVE 1 TABLET BY MOUTH AT BEDTIME EVERY MONDAY/WEDNESDAY/FRIDAY FOR HIGH CHOLESTEROL 10/30/23  Yes [name], MD  CALCIUM -VITAMIN D  PO Take by mouth. Patient taking differently: Take by mouth daily at 6 (six) AM.   Yes [provider]  Cholecalciferol (VITAMIN D -3) 25 MCG (1000 UT) CAPS Take 1 capsule by mouth daily.   Yes [provider]  donepezil  (ARICEPT ) 10 MG tablet TAKE 1 TABLET BY MOUTH EVERYDAY AT BEDTIME 09/13/23  Yes [name], MD  dorzolamide -timolol  (COSOPT ) 2-0.5 % ophthalmic solution Place 1 drop into both eyes 2 (two) times daily. 05/09/22  Yes [provider]  Insulin  Degludec 100 UNIT/ML SOLN Inject 6-7 Units into the skin daily as needed (For blood sugar levels between 280-300).   Yes [provider]  latanoprost  (XALATAN ) 0.005 % ophthalmic solution Place 1 drop into both eyes at bedtime. 05/31/22  Yes [provider]  levETIRAcetam  (KEPPRA ) 500 MG tablet TAKE 1 TABLET BY MOUTH TWICE A DAY 01/21/24  Yes [name], MD  Potassium 99 MG TABS Take 99 mg by mouth daily.   Yes [provider]  tamsulosin  (FLOMAX )  0.4 MG CAPS capsule TAKE 1 CAPSULE BY MOUTH EVERY DAY AFTER SUPPER Patient taking differently: Take 0.4 mg by mouth daily after supper. 02/05/24  Yes McKenzie, Belvie CROME, MD  amLODipine  (NORVASC ) 2.5 MG tablet Take 2.5 mg by mouth daily. Patient not taking: Reported on 04/07/2024 02/03/24   [provider]  blood glucose meter kit and supplies Dispense based on patient and insurance preference. Use up to two times daily as directed. (FOR ICD-10 E10.9, E11.9). Patient not taking: Reported on 04/07/2024 04/27/22   Therisa Benton PARAS, NP  Continuous Glucose Sensor (FREESTYLE LIBRE 2 SENSOR) MISC Change sensor every 14 days Patient not taking: Reported on 04/07/2024 02/01/23   Therisa Benton PARAS, NP  copper  tablet Take 1 tablet (2 mg total) by mouth daily. Patient not taking:  Reported on 04/07/2024 12/14/23   Kandala, Hyndavi, MD  insulin  glargine (LANTUS  SOLOSTAR) 100 UNIT/ML Solostar Pen INJECT 0.15 MLS (15 UNITS TOTAL) INTO THE SKIN AT BEDTIME. Patient not taking: Reported on 04/07/2024 01/17/24   Therisa Benton PARAS, NP  Insulin  Pen Needle (PEN NEEDLES) 31G X 6 MM MISC 1 each by Does not apply route at bedtime. Patient not taking: Reported on 04/07/2024 11/18/21   Therisa Benton PARAS, NP  mirtazapine (REMERON) 15 MG tablet Take 15 mg by mouth. Patient not taking: Reported on 04/07/2024 01/14/24   [provider]  sitaGLIPtin  (JANUVIA ) 100 MG tablet Take 1 tablet (100 mg total) by mouth daily. Patient not taking: Reported on 04/07/2024 10/15/23   Therisa Benton PARAS, NP   Scheduled Meds:  amLODipine   2.5 mg Oral Daily   atorvastatin   10 mg Oral Daily   Chlorhexidine  Gluconate Cloth  6 each Topical Daily   donepezil   10 mg Oral QHS   feeding supplement  237 mL Oral BID BM   insulin  aspart  0-5 Units Subcutaneous QHS   insulin  aspart  0-9 Units Subcutaneous TID WC   latanoprost   1 drop Both Eyes QHS   levETIRAcetam   500 mg Oral BID   multivitamin with minerals  1 tablet Oral Daily   mouth rinse  15 mL Mouth Rinse 4 times per day   senna-docusate  1 tablet Oral BID   tamsulosin   0.4 mg Oral QPC supper   Continuous Infusions:  PRN Meds: acetaminophen  **OR** acetaminophen  (TYLENOL ) oral liquid 160 mg/5 mL **OR** acetaminophen , labetalol, mouth rinse  Allergies:   Allergies[1]  Social History:   Social History   Socioeconomic History   Marital status: Married    Spouse name: Not on file   Number of children: Not on file   Years of education: Not on file   Highest education level: Not on file  Occupational History   Not on file  Tobacco Use   Smoking status: Never    Passive exposure: Never   Smokeless tobacco: Never  Vaping Use   Vaping status: Never Used  Substance and Sexual Activity   Alcohol use: No   Drug use: No   Sexual activity: Yes   Other Topics Concern   Not on file  Social History Narrative   Not on file   Social Drivers of Health   Tobacco Use: Low Risk (04/07/2024)   Patient History    Smoking Tobacco Use: Never    Smokeless Tobacco Use: Never    Passive Exposure: Never  Financial Resource Strain: Patient Declined (02/05/2024)   Overall Financial Resource Strain (CARDIA)    Difficulty of Paying Living Expenses: Patient declined  Food Insecurity: Food  Insecurity Present (02/05/2024)   Epic    Worried About Programme Researcher, Broadcasting/film/video in the Last Year: Often true    [employer] of Food in the Last Year: Often true  Transportation Needs: No Transportation Needs (02/05/2024)   Epic    Lack of Transportation (Medical): No    Lack of Transportation (Non-Medical): No  Physical Activity: Inactive (02/05/2024)   Exercise Vital Sign    Days of Exercise per Week: 0 days    Minutes of Exercise per Session: 0 min  Stress: No Stress Concern Present (02/05/2024)   [employer] of Occupational Health - Occupational Stress Questionnaire    Feeling of Stress: Not at all  Social Connections: Moderately Integrated (02/05/2024)   Social Connection and Isolation Panel    Frequency of Communication with Friends and Family: More than three times a week    Frequency of Social Gatherings with Friends and Family: Once a week    Attends Religious Services: More than 4 times per year    Active Member of [employer] or Organizations: No    Attends Banker Meetings: Never    Marital Status: Married  Catering Manager Violence: Not At Risk (02/05/2024)   Epic    Fear of Current or Ex-Partner: No    Emotionally Abused: No    Physically Abused: No    Sexually Abused: No  Depression (PHQ2-9): Low Risk (02/22/2024)   Depression (PHQ2-9)    PHQ-2 Score: 2  Alcohol Screen: Low Risk (02/05/2024)   Alcohol Screen    Last Alcohol Screening Score (AUDIT): 0  Housing: Low Risk (02/05/2024)   Epic    Unable to Pay for Housing in  the Last Year: No    Number of Times Moved in the Last Year: 0    Homeless in the Last Year: No  Utilities: Not At Risk (02/05/2024)   Epic    Threatened with loss of utilities: No  Health Literacy: Adequate Health Literacy (02/05/2024)   B1300 Health Literacy    Frequency of need for help with medical instructions: Never    Family History:   Family History  Problem Relation Age of Onset   Diabetes Mother    Hypertension Mother    Hyperlipidemia Mother    Stroke Mother    Heart failure Mother    Diabetes Father    Heart disease Father    Hypertension Father    Hyperlipidemia Father    Heart attack Father    Diabetes Brother    Diabetes Brother    Diabetes Son     ROS:  Please see the history of present illness.  All other ROS reviewed and negative.     Physical Exam/Data: Vitals:   04/08/24 1100 04/08/24 1200 04/08/24 1300 04/08/24 1400  BP: (!) 144/90 (!) 145/111 134/88 138/87  Pulse: 81 88 82 (!) 103  Resp: 14 (!) 22 (!) 22 (!) 23  Temp:  98.6 F (37 C)    TempSrc:  Axillary    SpO2: 99% 98% 98% 97%  Weight:      Height:       Intake/Output Summary (Last 24 hours) at 04/08/2024 1629 Last data filed at 04/08/2024 1500 Gross per 24 hour  Intake 1724.8 ml  Output 100 ml  Net 1624.8 ml      04/07/2024    8:20 PM 04/07/2024    9:49 AM 04/03/2024    8:31 PM  Last 3 Weights  Weight (lbs) 86 lb 6.7 oz 93 lb 4.1  oz 93 lb 4.1 oz  Weight (kg) 39.2 kg 42.3 kg 42.3 kg     Body mass index is 14.83 kg/m.   General:  cachetic male, in no acute distress HEENT: normal Neck: no JVD Vascular: Distal pulses 2+ bilaterally Cardiac:  normal S1, S2; RRR; no murmur  Lungs:  clear to auscultation bilaterally, no wheezing, rhonchi or rales  Abd: soft, nontender, no hepatomegaly  Ext: no edema Musculoskeletal:  No deformities Skin: warm and dry  Neuro:  disoriented to place, time, person  EKG:  The EKG was personally reviewed and demonstrates:  sinus rhythm, known  LAFB, HR 92,  no significant changes when compared to prior tracings   Telemetry:  Telemetry was personally reviewed and demonstrates:  sinus tachycardia, HR 100s  Relevant CV Studies:  Echocardiogram, 04/08/2024 Constellation of severe LVH, diastolic dysfunction, pericardial effusion concerning for infiltrative disease, specifically cardiac amyloid. Left ventricular ejection fraction, by estimation, is 50 to 55% . The left ventricle has low normal function. The left ventricle has no regional wall motion abnormalities. There is severe concentric left ventricular hypertrophy. Left ventricular diastolic parameters are consistent with Grade II diastolic dysfunction ( pseudonormalization)  Right ventricular systolic function is normal. The right ventricular size is normal. Severely increased right ventricular wall thickness.  A small pericardial effusion is present.  The mitral valve is normal in structure. No evidence of mitral valve regurgitation. No evidence of mitral stenosis.  The aortic valve is normal in structure. There is moderate thickening of the aortic valve. Aortic valve regurgitation is mild. Aortic valve sclerosis is present, with no evidence of aortic valve stenosis.  The inferior vena cava is normal in size with greater than 50% respiratory variability, suggesting right atrial pressure of 3 mmHg.  Laboratory Data: High Sensitivity Troponin:  No results for input(s): TROPONINIHS in the last 720 hours.  Recent Labs  Lab 04/07/24 1527 04/07/24 1640  TRNPT 113* 117*      Chemistry Recent Labs  Lab 04/03/24 2146 04/07/24 1047  NA 137 142  K 4.4 4.1  CL 109 110  CO2 15* 17*  GLUCOSE 234* 87  BUN 43* 31*  CREATININE 2.07* 1.81*  CALCIUM  8.6* 8.9  GFRNONAA 31* 36*  ANIONGAP 14 15    Recent Labs  Lab 04/03/24 2146 04/07/24 1047  PROT 8.2* 8.7*  ALBUMIN 4.0 4.2  AST 38 34  ALT 32 28  ALKPHOS 60 55  BILITOT 0.6 0.7   Lipids No results for input(s): CHOL, TRIG,  HDL, LABVLDL, LDLCALC, CHOLHDL in the last 168 hours.  Hematology Recent Labs  Lab 04/03/24 2146 04/07/24 1152  WBC 5.5 4.4  RBC 3.73* 4.57  HGB 11.6* 13.6  HCT 36.0* 44.0  MCV 96.5 96.3  MCH 31.1 29.8  MCHC 32.2 30.9  RDW 16.2* 16.3*  PLT 61* 64*   Thyroid  Recent Labs  Lab 04/08/24 0035  TSH 1.080    BNPNo results for input(s): BNP, PROBNP in the last 168 hours.  DDimer No results for input(s): DDIMER in the last 168 hours.  Radiology/Studies:  ECHOCARDIOGRAM COMPLETE Result Date: 04/08/2024    ECHOCARDIOGRAM REPORT   Patient Name:   KAINEN STRUCKMAN Date of Exam: 04/08/2024 Medical Rec #:  [id number]   Height:       64.0 in Accession #:    [id number]  Weight:       86.4 lb Date of Birth:  07/01/1940   BSA:          1.370  m Patient Age:    83 years    BP:           144/90 mmHg Patient Gender: M           HR:           80 bpm. Exam Location:  Inpatient Procedure: 2D Echo, Cardiac Doppler and Color Doppler (Both Spectral and Color            Flow Doppler were utilized during procedure). Indications:    Stroke  History:        Patient has no prior history of Echocardiogram examinations.                 Stroke, Signs/Symptoms:Edema; Risk Factors:Hypertension,                 Diabetes and Dyslipidemia.  Sonographer:    Juliene Rucks Referring Phys: [id number] [facility] KHALIQDINA IMPRESSIONS  1. 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 Left ventricular ejection fraction, by estimation, is 50 to 55%. The left ventricle has low normal function. The left ventricle has no regional wall motion abnormalities. There is severe concentric left ventricular hypertrophy. Left ventricular diastolic parameters are consistent with Grade II diastolic dysfunction (pseudonormalization).  2. Right ventricular systolic function is normal. The right ventricular size is normal. Severely increased right ventricular  wall thickness.  3. A small pericardial effusion is present.  4. The mitral valve is normal in structure. No evidence of mitral valve regurgitation. No evidence of mitral stenosis.  5. The aortic valve is normal in structure. There is moderate thickening of the aortic valve. Aortic valve regurgitation is mild. Aortic valve sclerosis is present, with no evidence of aortic valve stenosis.  6. The inferior vena cava is normal in size with greater than 50% respiratory variability, suggesting right atrial pressure of 3 mmHg. 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 Left ventricular ejection fraction, by estimation, is 50 to 55%. The left ventricle has low normal function. The left ventricle has no regional wall motion abnormalities. The left ventricular internal cavity size was normal in size. There is severe concentric left ventricular hypertrophy. Left ventricular diastolic parameters are consistent with Grade II diastolic dysfunction (pseudonormalization). Right Ventricle: The right ventricular size is normal. Severely increased right ventricular wall thickness. Right ventricular systolic function is normal. Left Atrium: Left atrial size was normal in size. Right Atrium: Right atrial size was normal in size. Pericardium: A small pericardial effusion is present. Mitral Valve: The mitral valve is normal in structure. No evidence of mitral valve regurgitation. No evidence of mitral valve stenosis. Tricuspid Valve: The tricuspid valve is normal in structure. Tricuspid valve regurgitation is not demonstrated. No evidence of tricuspid stenosis. Aortic Valve: The aortic valve is normal in structure. There is moderate thickening of the aortic valve. Aortic valve regurgitation is mild. Aortic valve sclerosis is present, with no evidence of aortic valve stenosis. Pulmonic Valve: The pulmonic valve was  normal in structure. Pulmonic valve regurgitation is not visualized. No evidence of pulmonic stenosis. Aorta: The aortic root is normal in size and structure. Venous: The inferior vena cava is normal in size with greater than 50% respiratory variability, suggesting right atrial pressure of 3 mmHg. IAS/Shunts: The interatrial septum was not well visualized.  LEFT VENTRICLE PLAX 2D LVIDd:         2.90 cm   Diastology LVIDs:  2.30 cm   LV e' medial:    3.81 cm/s LV PW:         1.60 cm   LV E/e' medial:  16.5 LV IVS:        1.90 cm   LV e' lateral:   5.22 cm/s LVOT diam:     1.80 cm   LV E/e' lateral: 12.0 LV SV:         28 LV SV Index:   20 LVOT Area:     2.54 cm  RIGHT VENTRICLE RV S prime:     9.57 cm/s TAPSE (M-mode): 1.1 cm LEFT ATRIUM             Index        RIGHT ATRIUM          Index LA diam:        2.00 cm 1.46 cm/m   RA Area:     9.90 cm LA Vol (A2C):   22.6 ml 16.50 ml/m  RA Volume:   21.00 ml 15.33 ml/m LA Vol (A4C):   11.4 ml 8.32 ml/m LA Biplane Vol: 16.9 ml 12.34 ml/m  AORTIC VALVE LVOT Vmax:   61.60 cm/s LVOT Vmean:  39.400 cm/s LVOT VTI:    0.110 m  AORTA Ao Root diam: 3.30 cm MITRAL VALVE MV Area (PHT): 3.91 cm    SHUNTS MV Decel Time: 194 msec    Systemic VTI:  0.11 m MV E velocity: 62.90 cm/s  Systemic Diam: 1.80 cm MV A velocity: 88.00 cm/s MV E/A ratio:  0.71 Morene Brownie Electronically signed by Morene Brownie Signature Date/Time: 04/08/2024/11:57:51 AM    Final    EEG adult Result Date: 04/08/2024 Shelton Arlin KIDD, MD     04/08/2024  9:34 AM Patient Name: ERBY SANDERSON MRN: [id number] Epilepsy Attending: Arlin KIDD Shelton Referring Physician/Provider: Vanessa Robert, MD Date: 04/08/2024 Duration: 23.20 mins Patient history: 83yo M with acute L choroid plexus IVH in the setting of thrombocytopenia. EEG to evaluate for seizure Level of alertness: Awake, asleep AEDs during EEG study: LEV Technical aspects: This EEG study was done with scalp electrodes positioned according to  the 10-20 International system of electrode placement. Electrical activity was reviewed with band pass filter of 1-70Hz , sensitivity of 7 uV/mm, display speed of 61mm/sec with a 60Hz  notched filter applied as appropriate. EEG data were recorded continuously and digitally stored.  Video monitoring was available and reviewed as appropriate. Description: The posterior dominant rhythm consists of 8-9Hz  activity of moderate voltage (25-35 uV) seen predominantly in posterior head regions, symmetric and reactive to eye opening and eye closing. Sleep was characterized by vertex waves, sleep spindles (12 to 14 Hz), maximal frontocentral region.  There is intermittent generalized 3 to 6 Hz theta-delta slowing. Hyperventilation and photic stimulation were not performed.   ABNORMALITY - Intermittent slow, generalized IMPRESSION: This study is suggestive of mild diffuse encephalopathy. No seizures or epileptiform discharges were seen throughout the recording. [name]   MR BRAIN W WO CONTRAST Result Date: 04/08/2024 EXAM: MRI BRAIN WITH AND WITHOUT CONTRAST 04/08/2024 04:47:00 AM TECHNIQUE: Multiplanar multisequence MRI of the head/brain was performed with and without the administration of intravenous contrast. CONTRAST: 4 mL of Gadavist . COMPARISON: MR Head 04/07/2024. CLINICAL HISTORY: Neuro deficit, acute, stroke suspected. FINDINGS: BRAIN AND VENTRICLES: No acute infarct. There has been decrease of intraventricular hemorrhage within the posterior horn of the right lateral ventricle. There has been mild interval worsening of diffuse subarachnoid hemorrhage, likely from redistribution.  The volume of intraventricular hemorrhage within the posterior horn of the left lateral ventricle has not changed significantly. No mass effect or midline shift. No hydrocephalus. The sella is unremarkable. Normal flow voids. No mass or abnormal enhancement. There is age-related atrophy and moderately advanced cerebral white matter  disease. This study is degraded by patient motion. ORBITS: No acute abnormality. SINUSES: No acute abnormality. BONES AND SOFT TISSUES: Normal bone marrow signal and enhancement. No acute soft tissue abnormality. IMPRESSION: 1. Mild interval worsening of diffuse subarachnoid hemorrhage, likely from redistribution. 2. Decreased intraventricular hemorrhage within the posterior horn of the right lateral ventricle. 3. Stable intraventricular hemorrhage within the posterior horn of the left lateral ventricle. 4. No definite evidence of underlying mass. 5. Age-related atrophy and moderately advanced cerebral white matter disease. 6. Motion degraded examination. Electronically signed by: Evalene Coho MD 04/08/2024 06:35 AM EST RP Workstation: [id number]   CT VENOGRAM HEAD Result Date: 04/07/2024 EXAM: CTA Head with Intravenous Contrast CLINICAL HISTORY: [place] venous sinus thrombosis suspected. TECHNIQUE: Axial CTA images of the head with intravenous contrast. MIP reconstructed images were created and reviewed. Dose reduction technique was used including one or more of the following: automated exposure control, adjustment of mA and kV according to patient size, and/or iterative reconstruction. CONTRAST: With; 75 mL (iohexol  (OMNIPAQUE ) 350 MG/ML injection 75 mL IOHEXOL  350 MG/ML SOLN). COMPARISON: 04/07/2024 FINDINGS: INTERNAL CAROTID ARTERIES: The intracranial ICAs are patent with no significant stenosis. No occlusion. No aneurysm. ANTERIOR CEREBRAL ARTERIES: No significant stenosis. No occlusion. No aneurysm. MIDDLE CEREBRAL ARTERIES: No significant stenosis. No occlusion. No aneurysm. POSTERIOR CEREBRAL ARTERIES: No significant stenosis. No occlusion. No aneurysm. BASILAR ARTERY: No significant stenosis. No occlusion. No aneurysm. VERTEBRAL ARTERIES: No significant stenosis. No occlusion. No aneurysm. SOFT TISSUES: No acute finding. No masses or lymphadenopathy. BONES: No acute osseous abnormality. BRAIN:  Unchanged appearance of intraventricular hematoma within the left lateral ventricle atrium. IMPRESSION: 1. No evidence of dural venous sinus thrombosis. 2. Unchanged appearance of intraventricular hematoma within the left lateral ventricle atrium. Electronically signed by: Franky Stanford MD 04/07/2024 10:57 PM EST RP Workstation: [id number]   CT HEAD POST STROKE FOLLOWUP/TIMED/STAT READ Result Date: 04/07/2024 EXAM: CT HEAD WITHOUT 04/07/2024 10:39:48 PM TECHNIQUE: CT of the head was performed without the administration of intravenous contrast. Automated exposure control, iterative reconstruction, and/or weight based adjustment of the mA/kV was utilized to reduce the radiation dose to as low as reasonably achievable. COMPARISON: 04/07/2024 CLINICAL HISTORY: Neuro deficit, acute, stroke suspected FINDINGS: BRAIN AND VENTRICLES: Unchanged hemorrhage within the left lateral ventricle. Unchanged mild communicating hydrocephalus. Resolution of old left sided subdural hematoma. No new site of hemorrhage. No evidence of acute infarct. ORBITS: No acute abnormality. SINUSES AND MASTOIDS: No acute abnormality. SOFT TISSUES AND SKULL: No acute skull fracture. No acute soft tissue abnormality. IMPRESSION: 1. Unchanged hemorrhage within the left lateral ventricle. 2. Unchanged mild communicating hydrocephalus. Electronically signed by: Franky Stanford MD 04/07/2024 10:55 PM EST RP Workstation: [id number]   CT ANGIO HEAD NECK W WO CM Result Date: 04/07/2024 EXAM: CTA HEAD AND NECK WITH AND WITHOUT 04/07/2024 06:15:51 PM TECHNIQUE: CTA of the head and neck was performed with and without the administration of intravenous contrast. 60 mL (iohexol  (OMNIPAQUE ) 350 MG/ML injection 60 mL IOHEXOL  350 MG/ML SOLN) was administered. Multiplanar 2D and/or 3D reformatted images are provided for review. Automated exposure control, iterative reconstruction, and/or weight based adjustment of the mA/kV was utilized to reduce the radiation dose  to as low as reasonably achievable. Stenosis of  the internal carotid arteries measured using NASCET criteria. COMPARISON: CT head and CT cervical spine dated 04/07/2024. CTA head and neck dated 03/19/2022. CLINICAL HISTORY: Stroke, hemorrhagic. FINDINGS: CTA NECK: AORTIC ARCH AND ARCH VESSELS: Atherosclerosis at the left subclavian artery origin without significant stenosis. Atherosclerosis of the partially visualized aortic arch. No dissection or arterial injury. No significant stenosis of the brachiocephalic arteries. CERVICAL CAROTID ARTERIES: Minimal atherosclerosis along the proximal right internal carotid artery without stenosis. Mild tortuosity of the distal right cervical ICA. Atherosclerosis at the left carotid bifurcation without hemodynamically significant stenosis. No dissection or arterial injury. CERVICAL VERTEBRAL ARTERIES: The right vertebral artery is dominant. Extradural origin of the right PICA. Diminished post PICA segment of the nondominant left vertebral artery. No dissection, arterial injury, or significant stenosis. LUNGS AND MEDIASTINUM: Unremarkable. SOFT TISSUES: Edentulous maxilla and mandible. No acute abnormality. BONES: Degenerative changes in the visualized spine. No acute abnormality. CTA HEAD: ANTERIOR CIRCULATION: Mild atherosclerosis of the carotid siphons. There is mild stenosis of the right cavernous ICA. Tortuosity of the cavernous ICAs bilaterally, greater on the right. No significant stenosis of the anterior cerebral arteries. No significant stenosis of the middle cerebral arteries. No aneurysm. POSTERIOR CIRCULATION: There is distal tapering of the basilar artery. Small P1 segments noted bilaterally. The posterior cerebral arteries are primarily supplied via the posterior communicating arteries. No significant stenosis of the posterior cerebral arteries. No significant stenosis of the basilar artery. No significant stenosis of the vertebral arteries. No aneurysm. OTHER: No  dural venous sinus thrombosis on this non-dedicated study. IMPRESSION: 1. No large vessel occlusion or aneurysm in the head or neck. 2. Mild atherosclerosis as above. No high-grade stenosis. Electronically signed by: Donnice Mania MD 04/07/2024 08:03 PM EST RP Workstation: [id number]   MR BRAIN WO CONTRAST Result Date: 04/07/2024 EXAM: MRI BRAIN WITHOUT CONTRAST 04/07/2024 01:58:05 PM TECHNIQUE: Multiplanar multisequence MRI of the head/brain was performed without the administration of intravenous contrast. COMPARISON: CT head 07/05/2022 in correlation with subsequently performed CT head 04/07/2024 2:05 pm. CLINICAL HISTORY: Neuro deficit, acute, stroke suspected. FINDINGS: BRAIN AND VENTRICLES: Increased DWI signal with suggestion of corresponding decreased ADC signal in the left periventricular white matter (series 6, image 32). Additional focus of suggested restricted diffusion in the left corpus callosum (series 5, image 28) adjacent to the left lateral ventricle. Mild parenchymal volume loss. Mild to moderate subcortical and periventricular T2 and FLAIR signal hyperintensity likely reflecting sequelae of chronic microvascular ischemia. Intraventricular hemorrhage involving the left choroid plexus. Layering hemorrhage within the right greater than left bilateral occipital horns. Increased ventricular caliber since prior CT. Decreased size of a 2 mm left parietal convexity extra-axial fluid collection. Small foci of FLAIR hyperintensity in the right temporal extra-axial region may represent small subdural hemorrhage. Susceptibility within the bilateral sylvian fissures and adjacent cerebral sulci in keeping with subarachnoid hemorrhage. Scattered foci of FLAIR hyperintensity and susceptibility within the cerebellar fissures also likely representing foci of subarachnoid hemorrhage. FLAIR hyperintensity involving the left transverse dural venous sinus which may relate to slow flow though thrombosis is not  excluded. No midline shift. The sella is unremarkable. Normal flow voids. ORBITS: No acute abnormality. SINUSES AND MASTOIDS: No acute abnormality. BONES AND SOFT TISSUES: Normal marrow signal. No acute soft tissue abnormality. IMPRESSION: 1. Intraventricular hemorrhage involving the left choroid plexus with layering hemorrhage in the right greater than left bilateral occipital horns. This could be due to an intraventricular tumor/ metastasis, vascular malformation, or hypertension. MRI brain with IV contrast would be useful to  further assess. 2. Increased ventricular caliber may signify a component of hydrocephalus. 3. Left transverse dural venous sinus signal abnormality may reflect slow flow, though dural venous sinus thrombosis is not excluded. MRV would be useful to further assess. 4. Questionable foci of restricted diffusion in the left periventricular white matter and left corpus callosum could represent ischemic infarction or cytotoxic/transependymal edema given adjacent choroid plexus hemorrhage. 5. Scattered subarachnoid hemorrhage may represent redistribution of intraventricular hemorrhage. 6. Possible trace right temporal convexity foci of extra-axial hemorrhage. 7. Decreased size of 2 mm left parietal convexity chronic subdural hematoma. Electronically signed by: Prentice Spade MD 04/07/2024 03:51 PM EST RP Workstation: [id number]   CT Head Wo Contrast Result Date: 04/07/2024 EXAM: CT HEAD WITHOUT CONTRAST 04/07/2024 02:21:09 PM TECHNIQUE: CT of the head was performed without the administration of intravenous contrast. Automated exposure control, iterative reconstruction, and/or weight based adjustment of the mA/kV was utilized to reduce the radiation dose to as low as reasonably achievable. COMPARISON: CT head 10/05/2022. MRI brain 04/07/2024 01:41:00 PM. CLINICAL HISTORY: Head trauma, minor (Age >= 65y). FINDINGS: BRAIN AND VENTRICLES: Left middle meningeal artery embolization. Overall similar  moderate scattered white matter hypodensities which are nonspecific but most commonly represent chronic microvascular ischemic changes. Significantly decreased size of left cerebral convexity hypoattenuating extra-axial fluid collection now measuring 1 to 2 mm, overlying the parietal lobe. Intraventricular hemorrhage centered in the region of the left choroid plexus measuring up to 2.7 x 2.2 cm in the axial plane with smaller adjacent foci of intraventricular hemorrhage, and layering hemorrhage in the right greater than left bilateral occipital horns. The temporal horns appear slightly enlarged since prior CT. Trace hyperattenuating foci in the right temporal extra-axial region which may also represent small subdural hemorrhage (series 2, image 38). Trace hyperattenuation within the cerebral sulci along the left sylvian fissure representing scattered subarachnoid hemorrhage. No evidence of acute territorial infarct. No significant mass effect or midline shift. ORBITS: Bilateral lens replacement. SINUSES: No acute abnormality. SOFT TISSUES AND SKULL: No acute soft tissue abnormality. No skull fracture. IMPRESSION: 1. Intraventricular hemorrhage centered in the left choroid plexus with layering hemorrhage in the right greater than left bilateral occipital horns. Increased temporal horn caliber since prior CT may signify hydrocephalus. 2. The above finding was communicated to Dr. Bernardino Fireman by phone at 2:33 PM. 3. Trace right temporal extra-axial hemorrhage compatible with a small subdural hemorrhage. 4. Trace subarachnoid hemorrhage along the left sylvian fissures which may represent redistribution. 5. Decreased size of a 1-2 mm chronic left parietal convexity subdural hematoma. 6. No calvarial fracture. Electronically signed by: Prentice Spade MD 04/07/2024 03:14 PM EST RP Workstation: [id number]   CT Cervical Spine Wo Contrast Result Date: 04/07/2024 EXAM: CT CERVICAL SPINE WITHOUT CONTRAST 04/07/2024 02:21:09  PM TECHNIQUE: CT of the cervical spine was performed without the administration of intravenous contrast. Multiplanar reformatted images are provided for review. Automated exposure control, iterative reconstruction, and/or weight based adjustment of the mA/kV was utilized to reduce the radiation dose to as low as reasonably achievable. COMPARISON: None available. CLINICAL HISTORY: Neck trauma (Age >= 65y) FINDINGS: BONES AND ALIGNMENT: Cervical spine straightening. No traumatic subluxation. No acute fracture. DEGENERATIVE CHANGES: Moderately advanced disc degeneration from C5 to T1. Mild upper cervical facet arthrosis. Right facet ankylosis at C2-C3. At least mild spinal stenosis at C5-C6, C6-C7, and C7-T1 with up to moderate neural foraminal stenosis. SOFT TISSUES: No prevertebral soft tissue swelling. Chest reported separately. IMPRESSION: 1. No acute cervical spine fracture or traumatic malalignment. Electronically  signed by: Dasie Hamburg MD 04/07/2024 03:04 PM EST RP Workstation: [id number]   Assessment and Plan:  Severe LVH Pericardial effusion, small  Concern for cardiac amyloidosis  Echo this admission: LVEF 50-55%, severe LVH, small pericardial effusion, G2DD, normal RV sytsolic function, severely increased RV wall thickness, no significant valvular abnormalities, normal IVC Patient confused to place, time, person -- unable to confirm his name/DOB Denies any current symptoms Unable to provide any history, family not present in room Most information obtained thru chart review  Due to admission with acute ICH, would defer additional cardiac amyloid workup at this time, may revisit as an outpatient when/if patient is stable and patient and family are wanting to pursue  Patient started on low-dose amlodipine , continue to monitor for AEs if suspecting amyloidosis, may need to revisit alternative options for HTN  Per primary ICH, IVH Thrombocytopenia History of  CVA Hypertension Hyperlipidemia Diabetes, type 2 Anemia   Risk Assessment/Risk Scores:      For questions or updates, please contact [facility] Please consult [email or web address] for contact info under   Signed, Lynwood Schilling, MD  04/08/2024 4:29 PM   History and all data above reviewed.  I personally took the history today, performed the physical exam and formulated the formative portion of the the assessment and plan.  I reviewed all relevant tests and studies. Patient examined.  I agree with the findings as above.  The patient is unable to answer questions but the son is in the room.  The patient lives with his daughter.  He has had a progressive failure to thrive it sounds like.  He gets around the house normally slowly with a cane but has had to have assistance recently.  He had increasing confusion and the family understands that he has dementia.  However, there is not been any cardiac complaints.  In particular does not been any shortness of breath, PND or orthopnea.  He has had slow progressive weight loss.  The family says he does eat.  The patient is unable to offer any symptoms or history although he says he is not in pain and he is breathing okay.  He is not oriented to other than self.    The patient exam reveals:  Very frail appearing, COR: Regular rate and rhythm,  Lungs: Clear to auscultation bilaterally,  Abd: Positive bowel sounds, Ext no edema, cachectic.  All available labs, radiology testing, previous records reviewed. Agree with documented assessment and plan.   Left ventricular hypertrophy: I think is very likely based on his constellation of chronic abnormalities and the appearance of his echo that he has amyloid.  However, thankfully he is not having any uncontrolled diastolic heart failure symptoms.  I think is very unlikely he would benefit from acute therapy but we could consider further workup and management pending his recovery from his acute neurologic event.   I discussed this with his son.  Pericardial effusion: This is small and can be followed.  Hypertension: Amlodipine  has been started.  Follow and titrate as necessary.    Lynwood Deamber Buckhalter  4:31 PM  04/08/2024     [1]  Allergies Allergen Reactions   Alpha-Gal    "

## 2024-04-08 NOTE — Progress Notes (Signed)
 Echocardiogram 2D Echocardiogram has been performed.  Jacob Barnes 04/08/2024, 11:53 AM

## 2024-04-08 NOTE — Procedures (Signed)
 Patient Name: Jacob Barnes  MRN: [id number]  Epilepsy Attending: Arlin MALVA Krebs  Referring Physician/Provider: Khaliqdina, Salman, MD  Date: 04/08/2024 Duration: 23.20 mins  Patient history: 83yo M with acute L choroid plexus IVH in the setting of thrombocytopenia. EEG to evaluate for seizure  Level of alertness: Awake, asleep  AEDs during EEG study: LEV  Technical aspects: This EEG study was done with scalp electrodes positioned according to the 10-20 International system of electrode placement. Electrical activity was reviewed with band pass filter of 1-70Hz , sensitivity of 7 uV/mm, display speed of 94mm/sec with a 60Hz  notched filter applied as appropriate. EEG data were recorded continuously and digitally stored.  Video monitoring was available and reviewed as appropriate.  Description: The posterior dominant rhythm consists of 8-9Hz  activity of moderate voltage (25-35 uV) seen predominantly in posterior head regions, symmetric and reactive to eye opening and eye closing. Sleep was characterized by vertex waves, sleep spindles (12 to 14 Hz), maximal frontocentral region.  There is intermittent generalized 3 to 6 Hz theta-delta slowing. Hyperventilation and photic stimulation were not performed.     ABNORMALITY - Intermittent slow, generalized  IMPRESSION: This study is suggestive of mild diffuse encephalopathy. No seizures or epileptiform discharges were seen throughout the recording.  Geoffry Bannister O Mystery Schrupp

## 2024-04-08 NOTE — Progress Notes (Signed)
 Inpatient Rehab Admissions Coordinator:   Per therapy recommendations, patient was screened for CIR candidacy by Megan Salon, MS, CCC-SLP.  At this time, Pt. is not at a level to tolerate the intensity of CIR. However,  Pt. may have potential to progress to becoming a potential CIR candidate, so CIR admissions team will follow and monitor for progress and participation with therapies and place consult order if Pt. appears to be an appropriate candidate. Please contact me with any questions.   Megan Salon, MS, CCC-SLP Rehab Admissions Coordinator  [phone] (celll) [phone] (office)

## 2024-04-08 NOTE — Evaluation (Signed)
 Speech Language Pathology Evaluation Patient Details Name: Jacob Barnes MRN: [id number] DOB: 1942/04/84 Today's Date: 04/08/2024 Time: 8941-8893 SLP Time Calculation (min) (ACUTE ONLY): 8 min  Problem List:  Patient Active Problem List   Diagnosis Date Noted   ICH (intracerebral hemorrhage) (HCC) 04/07/2024   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   Normocytic anemia 09/07/2023   Thrombocytopenia 09/07/2023   Elevated serum immunoglobulin free light chains 09/07/2023   Dry eyes 06/16/2023   Kidney stones 06/16/2023   Diabetes mellitus with complication, with long-term current use of insulin  (HCC) 04/05/2023   Malnutrition of moderate degree 02/11/2023   Opacity of lung on imaging study 02/11/2023   Hearing loss of left ear due to cerumen impaction 04/24/2022   Sudden idiopathic hearing loss, unspecified ear 04/24/2022   Subdural hematoma (HCC) 03/19/2022   Allergy to alpha-gal 03/13/2022   Loss of weight 12/04/2021   Hyperlipidemia LDL goal <100 10/24/2021   Traumatic retroperitoneal hematoma 05/03/2021   Dementia (HCC) 01/24/2021   Chronic diarrhea 06/18/2020   At high risk for falls 04/09/2020   Uncontrolled type 2 diabetes mellitus with hyperglycemia (HCC) 01/28/2020   Osteopenia 01/28/2020   Compression fracture of L1 lumbar vertebra (HCC) 01/09/2020   After-cataract with vision obscured 02/10/2019   Diabetic macular edema (HCC) 02/10/2019   Dry eye syndrome of bilateral lacrimal glands 02/10/2019   Lens replaced by other means 02/10/2019   Open-angle glaucoma 02/10/2019   Presence of intraocular lens 02/10/2019   Unspecified visual loss 02/10/2019   Type 2 diabetes mellitus with proliferative retinopathy of both eyes and macular edema (HCC) 02/10/2019   Type 2 diabetes mellitus with vascular disease (HCC) 10/20/2018   Bilateral pseudophakia 02/08/2016   Proliferative retinopathy with retinal edema due to type 2 diabetes mellitus (HCC) 02/08/2016   Primary open angle glaucoma of  both eyes 02/08/2016   Iron  deficiency anemia 01/10/2014   CKD (chronic kidney disease) stage 3, GFR 30-59 ml/min (HCC) 12/15/2013   AKI (acute kidney injury) 12/11/2013   Anemia 12/11/2013   Mild protein-calorie malnutrition 12/11/2013   GERD (gastroesophageal reflux disease) 06/27/2012   Background diabetic retinopathy (HCC) 03/28/2012   Retinal edema 03/28/2012   Tear film insufficiency 08/07/2011   Borderline glaucoma, open angle with borderline findings 03/22/2011   Essential hypertension, benign 01/02/2009   Past Medical History:  Past Medical History:  Diagnosis Date   Abrasion of right middle finger with infection    for 1 week,    Allergy 2023   Amputation of right middle finger 10/10/2016   Anemia    Arthritis    hands   Carpal tunnel syndrome 08/05/2009   Qualifier: Diagnosis of  By: [name], [name]     Cataract 2017   Chronic kidney disease    Diabetes mellitus    says since 1979 type 2   Diabetes mellitus without complication (HCC)    [employer] 04/03/2020   ERECTILE DYSFUNCTION, ORGANIC 01/20/2009   Qualifier: Diagnosis of  By: Antonetta MD, Margaret     GERD (gastroesophageal reflux disease)    Glaucoma    both eyes   History of kidney stones    Hyperlipemia    Hypertension    Iron  deficiency anemia 01/10/2014   Osteoporosis September 2021   Other pancytopenia (HCC) 01/10/2014   New in 01/2014, undergoing pathology review    Rash    front abdomen no drainage   Past Surgical History:  Past Surgical History:  Procedure Laterality Date   AMPUTATION Right 09/01/2016   Procedure: AMPUTATION  RIGHT MIDDLE FINGER TIP;  Surgeon: [name], MD;  Location: [facility];  Service: Orthopedics;  Laterality: Right;   AMPUTATION Right 10/10/2016   Procedure: REPEAT IRRIGATION AND DEBRIDEMENT RIGHT MIDDLE FINGER WITH AMPUTATION THROUGH PROXIMAL PHALANX;  Surgeon: [name], MD;  Location: [facility];  Service: Orthopedics;  Laterality: Right;    APPENDECTOMY     BIOPSY  01/13/2022   Procedure: BIOPSY;  Surgeon: [name], MD;  Location: [facility];  Service: Gastroenterology;;   COLONOSCOPY  09/06/2011   Procedure: COLONOSCOPY;  Surgeon: [name], MD;  Location: [facility];  Service: Endoscopy;  Laterality: N/A;  830   COLONOSCOPY WITH PROPOFOL  N/A 01/13/2022   Procedure: COLONOSCOPY WITH PROPOFOL ;  Surgeon: [name], MD;  Location: [facility];  Service: Gastroenterology;  Laterality: N/A;  200 ASA 3   CYSTOSCOPY/RETROGRADE/URETEROSCOPY/STONE EXTRACTION WITH BASKET     ESOPHAGOGASTRODUODENOSCOPY N/A 01/15/2014   Procedure: ESOPHAGOGASTRODUODENOSCOPY (EGD);  Surgeon: [name], MD;  Location: [facility];  Service: Endoscopy;  Laterality: N/A;  200   ESOPHAGOGASTRODUODENOSCOPY N/A 04/22/2014   Procedure: ESOPHAGOGASTRODUODENOSCOPY (EGD);  Surgeon: [name], MD;  Location: [facility];  Service: Endoscopy;  Laterality: N/A;  240   EXTRACORPOREAL SHOCK WAVE LITHOTRIPSY Left 01/02/2023   Procedure: EXTRACORPOREAL SHOCK WAVE LITHOTRIPSY (ESWL);  Surgeon: [name], MD;  Location: [facility];  Service: Urology;  Laterality: Left;   EYE SURGERY Bilateral yrs ago   ioc for cataract   IR ANGIO EXTERNAL CAROTID SEL EXT CAROTID UNI L MOD SED  03/22/2022   IR ANGIO INTRA EXTRACRAN SEL INTERNAL CAROTID UNI L MOD SED  03/26/2022   IR ANGIOGRAM FOLLOW UP STUDY  03/26/2022   IR NEURO EACH ADD'L AFTER BASIC UNI LEFT (MS)  03/26/2022   IR TRANSCATH/EMBOLIZ  03/26/2022   MALONEY DILATION N/A 01/15/2014   Procedure: AGAPITO DILATION;  Surgeon: [name], MD;  Location: [facility];  Service: Endoscopy;  Laterality: N/A;   MALONEY DILATION  04/22/2014   Procedure: AGAPITO DILATION;  Surgeon: [name], MD;  Location: [facility];  Service: Endoscopy;;   POLYPECTOMY  01/13/2022   Procedure: POLYPECTOMY;  Surgeon: [name], [name], MD;  Location: [facility];   Service: Gastroenterology;;   RADIOLOGY WITH ANESTHESIA Left 03/22/2022   Procedure: LEFT MMA EMBOLIZATION;  Surgeon: Lanis Pupa, MD;  Location: [facility];  Service: Radiology;  Laterality: Left;   surgery for left eye bleedf  2017   HPI:  JAYDIAN SANTANA is a 83 y.o. male who is brought in to the ED with confusion and generalized weakness. CT head demonstrated IVH centered in the L choroid plexus with layering hemorrhage in BL occipital lobes. PMH: dementia, HTN, HLD, GERD, DM2, osteoporosis, anemia, thrombocytopenia, prior L SDH, retinopathy   Assessment / Plan / Recommendation Clinical Impression  Patient presents with moderately impaired cognitive linguistic function, though no family present to determine if current function is in correlation with baseline status given history of dementia. Patient is disoriented to date, place, and situation. Memory, problem solving, awareness, and sustained attention are also impaired, though patient demonstrated relative strength in basic verbal calculations. Patient endorses that he lives with his daughter and that daughter assists with all ADLs/iADLs at baseline, though question patient's ability to be accurate historian. Given patient's history of dementia and current status as cognitively dependent with caregivers, no further acute SLP needs indicated, however patient may benefit from home health SLP upon  discharge.    SLP Assessment  SLP Recommendation/Assessment: All further Speech Language Pathology needs can be addressed in the next venue of care SLP Visit Diagnosis: Cognitive communication deficit (R41.841)     Assistance Recommended at Discharge  Frequent or constant Supervision/Assistance  Functional Status Assessment Patient has had a recent decline in their functional status and/or demonstrates limited ability to make significant improvements in function in a reasonable and predictable amount of time  Frequency and Duration           SLP  Evaluation Cognition  Overall Cognitive Status: No family/caregiver present to determine baseline cognitive functioning Arousal/Alertness: Lethargic Orientation Level: Oriented to person;Oriented to time;Disoriented to place;Disoriented to situation Year:  (2004) Day of Week: Incorrect Attention: Sustained Sustained Attention: Impaired Sustained Attention Impairment: Verbal basic;Functional basic Memory: Impaired Memory Impairment: Storage deficit;Retrieval deficit;Decreased recall of new information Awareness: Impaired Awareness Impairment: Intellectual impairment;Emergent impairment;Anticipatory impairment Problem Solving: Impaired Problem Solving Impairment: Verbal basic;Functional basic Safety/Judgment: Impaired       Comprehension  Auditory Comprehension Overall Auditory Comprehension: Appears within functional limits for tasks assessed    Expression Expression Primary Mode of Expression: Verbal Verbal Expression Overall Verbal Expression: Appears within functional limits for tasks assessed   Oral / Motor  Oral Motor/Sensory Function Overall Oral Motor/Sensory Function: Generalized oral weakness Motor Speech Overall Motor Speech: Appears within functional limits for tasks assessed Respiration: Within functional limits Phonation: Low vocal intensity Resonance: Within functional limits Articulation: Within functional limitis Intelligibility: Intelligible Motor Planning: Within functional limits Motor Speech Errors: Not applicable Interfering Components: Inadequate dentition Effective Techniques: Increased vocal intensity            Rosina A Jmari Pelc 04/08/2024, 1:02 PM

## 2024-04-08 NOTE — Evaluation (Signed)
 Physical Therapy Evaluation  Patient Details Name: Jacob Barnes MRN: [id number] DOB: [birth year] Today's Date: 04/08/2024  History of Present Illness  Kerby JAYCOB MCCLENTON is a 83 y.o. male who is brought in to the ED with confusion and generalized weakness. CT head demonstrated IVH centered in the L choroid plexus with layering hemorrhage in BL occipital lobes. PMH: dementia, HTN, HLD, GERD, DM2, osteoporosis, anemia, thrombocytopenia, prior L SDH, retinopathy   Clinical Impression  Pt admitted with above. Per chart PTA Pt was amb with cane and living with dtr and grandson. Pt reports doing his own dressing and bathing but dtr provided transportation and did meal prep. Pt with good command follow however kept eyes closed t/o session. Pt with posterior bias in sitting EOB and required maxAX2 for sit to stand and pvt to chair. Pt with symptomatic drop in BP from 123/91 while sititng EOB to 73/48 post transfer to chair. Pt with significant retropulsion, ataxia t/o LEs, inability to sequence stepping pattern to chair with progressive further retropulsion. Pt mobility greatly limited by orthostatic BP at this time but suspect once resolved pt to progress well. Pt to benefit from aggressive inpatient rehab program > 3 hrs a day to address above deficits and achieve safe level of function to return home with daughter and grandson. Acute PT to cont to follow.  Orthostatic BP: Sitting EOB: 123/91 S/p stand: 73/48 S/p 1 min in chair with LEs down: 100/60 Once LEs elevated in chair: 147/101  RN present and aware.      If plan is discharge home, recommend the following: A lot of help with walking and/or transfers;A lot of help with bathing/dressing/bathroom;Assist for transportation;Help with stairs or ramp for entrance;Supervision due to cognitive status   Can travel by private vehicle        Equipment Recommendations  (TBD at next venue)  Recommendations for Other Services  Rehab consult    Functional  Status Assessment Patient has had a recent decline in their functional status and demonstrates the ability to make significant improvements in function in a reasonable and predictable amount of time.     Precautions / Restrictions Precautions Precautions: Fall Precaution/Restrictions Comments: watch out for orthostatic BP Restrictions Weight Bearing Restrictions Per Provider Order: No      Mobility  Bed Mobility               General bed mobility comments: pt received sitting EOB with RN    Transfers Overall transfer level: Needs assistance Equipment used: 2 person hand held assist Transfers: Sit to/from Stand, Bed to chair/wheelchair/BSC Sit to Stand: Max assist, +2 physical assistance Stand pivot transfers: Max assist, +2 physical assistance         General transfer comment: Pt with retropulsion despite tactile and verbal cues to bring trunk/hips forward, kept eyes closed, reports dizziness, pt unable to coordinate stepping pattern, BP dropped from 123/91 to 73/48.    Ambulation/Gait               General Gait Details: unable due to symptomatic orthostatic hypotension  Stairs            Wheelchair Mobility     Tilt Bed    Modified Rankin (Stroke Patients Only) Modified Rankin (Stroke Patients Only) Pre-Morbid Rankin Score: Moderate disability Modified Rankin: Severe disability     Balance Overall balance assessment: Needs assistance Sitting-balance support: Feet supported, No upper extremity supported Sitting balance-Leahy Scale: Fair Sitting balance - Comments: posterior bias   Standing balance  support: Bilateral upper extremity supported, During functional activity, Reliant on assistive device for balance Standing balance-Leahy Scale: Zero Standing balance comment: pt with retropulsion, inability and resistive to standing upright, RN and PT providing bilat HHA and posterior support at gait belt                              Pertinent Vitals/Pain Pain Assessment Pain Assessment: No/denies pain    Home Living Family/patient expects to be discharged to:: Private residence Living Arrangements: Children (daughter and grandson) Available Help at Discharge: Family;Available PRN/intermittently Type of Home: House Home Access: Level entry     Alternate Level Stairs-Number of Steps: flight to basement Home Layout: Two level;Able to live on main level with bedroom/bathroom Home Equipment: Shower seat      Prior Function Prior Level of Function : Needs assist;Patient poor historian/Family not available             Mobility Comments: per patient he walks with a cane sometimes, per RN family reports he uses the cane most of the time ADLs Comments: pt reports being indep but unsure of accuracy of report, doesn't drive, doesn't cook, reports dtr makes meals     Extremity/Trunk Assessment   Upper Extremity Assessment Upper Extremity Assessment: Generalized weakness (bilat finger flexion contractures)    Lower Extremity Assessment Lower Extremity Assessment: Generalized weakness (tight hamstrings)    Cervical / Trunk Assessment Cervical / Trunk Assessment: Kyphotic  Communication   Communication Communication: Impaired Factors Affecting Communication: Hearing impaired (extremely HOH)    Cognition Arousal: Alert Behavior During Therapy: Flat affect   PT - Cognitive impairments: History of cognitive impairments                       PT - Cognition Comments: pt extremely HOH, unsure of cognition at baseline, per chart pt with dementia. Pt very pleasant and able to follow simple commands. keeps eyes closed but opens to commands but prefers to keep them closed Following commands: Impaired Following commands impaired: Follows one step commands with increased time (HOH is a contributing factor)     Cueing Cueing Techniques: Verbal cues, Tactile cues     General Comments General  comments (skin integrity, edema, etc.): pt with orthostatic BP    Exercises     Assessment/Plan    PT Assessment Patient needs continued PT services  PT Problem List Decreased strength;Decreased range of motion;Decreased activity tolerance;Decreased balance;Decreased mobility;Decreased coordination;Decreased cognition;Decreased knowledge of use of DME;Decreased safety awareness       PT Treatment Interventions DME instruction;Gait training;Stair training;Functional mobility training;Therapeutic activities;Therapeutic exercise;Balance training;Neuromuscular re-education    PT Goals (Current goals can be found in the Care Plan section)  Acute Rehab PT Goals Patient Stated Goal: didn't state PT Goal Formulation: With patient Time For Goal Achievement: 04/22/24 Potential to Achieve Goals: Fair    Frequency Min 3X/week     Co-evaluation               AM-PAC PT 6 Clicks Mobility  Outcome Measure Help needed turning from your back to your side while in a flat bed without using bedrails?: A Lot Help needed moving from lying on your back to sitting on the side of a flat bed without using bedrails?: A Lot Help needed moving to and from a bed to a chair (including a wheelchair)?: A Lot Help needed standing up from a chair using your arms (e.g., wheelchair  or bedside chair)?: A Lot Help needed to walk in hospital room?: Total Help needed climbing 3-5 steps with a railing? : Total 6 Click Score: 10    End of Session Equipment Utilized During Treatment: Gait belt Activity Tolerance: Other (comment) (limited by orthostatic BP) Patient left: in chair;with call bell/phone within reach;with nursing/sitter in room Nurse Communication: Mobility status (orthostatic BP, RN present during session) PT Visit Diagnosis: Unsteadiness on feet (R26.81);Muscle weakness (generalized) (M62.81);Difficulty in walking, not elsewhere classified (R26.2);History of falling (Z91.81)    Time:  9241-9177 PT Time Calculation (min) (ACUTE ONLY): 24 min   Charges:   PT Evaluation $PT Eval Moderate Complexity: 1 Mod PT Treatments $Therapeutic Activity: 8-22 mins PT General Charges $$ ACUTE PT VISIT: 1 Visit         Norene Ames, PT, DPT Acute Rehabilitation Services Secure chat preferred Office #: [phone]   Norene CHRISTELLA Ames 04/08/2024, 8:44 AM

## 2024-04-08 NOTE — Progress Notes (Signed)
 EEG complete - results pending

## 2024-04-08 NOTE — Progress Notes (Signed)
 Pt has orders for SBP target control 130-150. Sal MD made aware that patients systolic blood pressure is less than 130 (all other BP values within parameters) with cleviprex  turned off and RN received instructions that BP values are acceptable. No new orders received at this time.

## 2024-04-08 NOTE — Plan of Care (Signed)

## 2024-04-08 NOTE — Evaluation (Signed)
 Occupational Therapy Evaluation Patient Details Name: Jacob Barnes MRN: [id number] DOB: 20-Aug-1940 Today's Date: 04/08/2024   History of Present Illness   Jacob Barnes is a 83 y.o. male who is brought in to the ED with confusion and generalized weakness. CT head demonstrated IVH centered in the L choroid plexus with layering hemorrhage in BL occipital lobes. PMH: dementia, HTN, HLD, GERD, DM2, osteoporosis, anemia, thrombocytopenia, prior L SDH, retinopathy     Clinical Impressions PTA patient reports independent with ADLs, using cane for mobility- but pt poor historian due to dementia.  Admitted for above and presents with problem list below.  Pt currently requires max-total assist for ADLs, max assist +2 for transfers and total assist +2 to return back to bed.  Pt denies visual changes, but will need further assessment.  Assessment limited today as requires back to bed for echo. Pt becoming symptomatic with hypotension after transfer to recliner, see below for details.  Pt will benefit from further OT services acutely and after dc at AIR level to optimize return to PLOF and decrease burden of care.  Will follow.   BP recliner 142/103 BP legs down recliner 120/77 BP supine after transfer to bed 101/80 BP after several minutes supine 147/102  RN present and aware      If plan is discharge home, recommend the following:   Two people to help with walking and/or transfers;Two people to help with bathing/dressing/bathroom;Assistance with cooking/housework;Assist for transportation;Help with stairs or ramp for entrance;Direct supervision/assist for financial management;Direct supervision/assist for medications management;Assistance with feeding;Supervision due to cognitive status     Functional Status Assessment   Patient has had a recent decline in their functional status and demonstrates the ability to make significant improvements in function in a reasonable and predictable amount of  time.     Equipment Recommendations   BSC/3in1 (defer)     Recommendations for Other Services   Rehab consult     Precautions/Restrictions   Precautions Precautions: Fall Recall of Precautions/Restrictions: Impaired Precaution/Restrictions Comments: watch out for orthostatic BP Restrictions Weight Bearing Restrictions Per Provider Order: No     Mobility Bed Mobility Overal bed mobility: Needs Assistance Bed Mobility: Sit to Supine       Sit to supine: Total assist, +2 for physical assistance   General bed mobility comments: to transition back to bed    Transfers Overall transfer level: Needs assistance Equipment used: 2 person hand held assist Transfers: Sit to/from Stand, Bed to chair/wheelchair/BSC Sit to Stand: Max assist, +2 physical assistance Stand pivot transfers: Max assist, +2 physical assistance         General transfer comment: pt with retropulsion in standing, despite cueing unable to shift weight forward.  needing max +2 for safety to pivot to EOB      Balance Overall balance assessment: Needs assistance Sitting-balance support: Feet supported, No upper extremity supported Sitting balance-Leahy Scale: Fair Sitting balance - Comments: posterior bias   Standing balance support: Bilateral upper extremity supported, During functional activity, Reliant on assistive device for balance Standing balance-Leahy Scale: Zero Standing balance comment: pt with retropulsion, inability and resistive to standing upright, RN and PT providing bilat HHA and posterior support at gait belt                           ADL either performed or assessed with clinical judgement   ADL Overall ADL's : Needs assistance/impaired     Grooming: Moderate assistance;Sitting  Upper Body Dressing : Sitting;Maximal assistance   Lower Body Dressing: Maximal assistance;+2 for physical assistance;Sit to/from stand   Toilet Transfer: Maximal  assistance;+2 for physical assistance;Stand-pivot Toilet Transfer Details (indicate cue type and reason): from recliner to EOB         Functional mobility during ADLs: Maximal assistance;+2 for physical assistance;Cueing for safety;Cueing for sequencing       Vision   Vision Assessment?: Vision impaired- to be further tested in functional context Additional Comments: pt denies visual changes, noted hx of glacoma and cataracts.  He is unable to read name tag.  IVH affecting bil occipital lobes and will need further assessment     Perception         Praxis         Pertinent Vitals/Pain Pain Assessment Pain Assessment: No/denies pain     Extremity/Trunk Assessment Upper Extremity Assessment Upper Extremity Assessment: Generalized weakness (pt with limited hand grasp, noted arthritic joints- likely baseline;  L middle finger amputation)   Lower Extremity Assessment Lower Extremity Assessment: Defer to PT evaluation   Cervical / Trunk Assessment Cervical / Trunk Assessment: Kyphotic   Communication Communication Communication: Impaired Factors Affecting Communication: Hearing impaired (extremely HOH)   Cognition Arousal: Alert Behavior During Therapy: Flat affect Cognition: Difficult to assess, History of cognitive impairments Difficult to assess due to: Hard of hearing/deaf           OT - Cognition Comments: pt follows 1 step commands with increased time, poor awareness of deficits. continue assessment but noted hx of dementia                 Following commands: Impaired Following commands impaired: Follows one step commands with increased time     Cueing  General Comments   Cueing Techniques: Verbal cues;Tactile cues  orthostatic BP   Exercises     Shoulder Instructions      Home Living Family/patient expects to be discharged to:: Private residence Living Arrangements: Children (daughter and grandson) Available Help at Discharge:  Family;Available PRN/intermittently Type of Home: House Home Access: Level entry     Home Layout: Two level;Able to live on main level with bedroom/bathroom Alternate Level Stairs-Number of Steps: flight to basement   Bathroom Shower/Tub: Producer, Television/film/video: Standard     Home Equipment: Information systems manager      Lives With: Daughter    Prior Functioning/Environment Prior Level of Function : Needs assist;Patient poor historian/Family not available             Mobility Comments: per patient he walks with a cane sometimes, per RN family reports he uses the cane most of the time ADLs Comments: pt reports being indep but unsure of accuracy of report, doesn't drive, doesn't cook, reports dtr makes meals    OT Problem List: Decreased strength;Decreased activity tolerance;Impaired balance (sitting and/or standing);Impaired vision/perception;Decreased coordination;Decreased cognition;Decreased safety awareness;Decreased knowledge of use of DME or AE;Decreased knowledge of precautions;Impaired UE functional use;Cardiopulmonary status limiting activity   OT Treatment/Interventions: Self-care/ADL training;Therapeutic exercise;DME and/or AE instruction;Therapeutic activities;Cognitive remediation/compensation;Visual/perceptual remediation/compensation;Patient/family education;Balance training;Neuromuscular education      OT Goals(Current goals can be found in the care plan section)   Acute Rehab OT Goals Patient Stated Goal: none stated OT Goal Formulation: Patient unable to participate in goal setting Time For Goal Achievement: 04/22/24 Potential to Achieve Goals: Good   OT Frequency:  Min 2X/week    Co-evaluation  AM-PAC OT 6 Clicks Daily Activity     Outcome Measure Help from another person eating meals?: A Lot Help from another person taking care of personal grooming?: A Lot Help from another person toileting, which includes using toliet, bedpan, or  urinal?: Total Help from another person bathing (including washing, rinsing, drying)?: A Lot Help from another person to put on and taking off regular upper body clothing?: A Lot Help from another person to put on and taking off regular lower body clothing?: Total 6 Click Score: 10   End of Session Equipment Utilized During Treatment: Gait belt Nurse Communication: Mobility status;Precautions  Activity Tolerance: Patient tolerated treatment well Patient left: in bed;with call bell/phone within reach;with nursing/sitter in room  OT Visit Diagnosis: Other abnormalities of gait and mobility (R26.89);Muscle weakness (generalized) (M62.81);Other symptoms and signs involving the nervous system (R29.898)                Time: 9046-8986 OT Time Calculation (min): 20 min Charges:  OT General Charges $OT Visit: 1 Visit OT Evaluation $OT Eval Moderate Complexity: 1 Mod  Etta NOVAK, OT Acute Rehabilitation Services Office [phone] Secure Chat Preferred    Etta GORMAN Hope 04/08/2024, 1:20 PM

## 2024-04-09 DIAGNOSIS — E43 Unspecified severe protein-calorie malnutrition: Secondary | ICD-10-CM | POA: Insufficient documentation

## 2024-04-09 DIAGNOSIS — E785 Hyperlipidemia, unspecified: Secondary | ICD-10-CM | POA: Diagnosis not present

## 2024-04-09 DIAGNOSIS — I611 Nontraumatic intracerebral hemorrhage in hemisphere, cortical: Secondary | ICD-10-CM

## 2024-04-09 DIAGNOSIS — Z794 Long term (current) use of insulin: Secondary | ICD-10-CM | POA: Diagnosis not present

## 2024-04-09 DIAGNOSIS — E119 Type 2 diabetes mellitus without complications: Secondary | ICD-10-CM | POA: Diagnosis not present

## 2024-04-09 DIAGNOSIS — Z8249 Family history of ischemic heart disease and other diseases of the circulatory system: Secondary | ICD-10-CM | POA: Diagnosis not present

## 2024-04-09 DIAGNOSIS — I615 Nontraumatic intracerebral hemorrhage, intraventricular: Secondary | ICD-10-CM | POA: Diagnosis not present

## 2024-04-09 DIAGNOSIS — I10 Essential (primary) hypertension: Secondary | ICD-10-CM | POA: Diagnosis not present

## 2024-04-09 DIAGNOSIS — R29704 NIHSS score 4: Secondary | ICD-10-CM | POA: Diagnosis not present

## 2024-04-09 DIAGNOSIS — D696 Thrombocytopenia, unspecified: Secondary | ICD-10-CM | POA: Diagnosis not present

## 2024-04-09 LAB — BPAM PLATELET PHERESIS
Blood Product Expiration Date: 202601012359
ISSUE DATE / TIME: 202512300053
Unit Type and Rh: 5100

## 2024-04-09 LAB — BASIC METABOLIC PANEL WITH GFR
Anion gap: 12 (ref 5–15)
BUN: 53 mg/dL — ABNORMAL HIGH (ref 8–23)
CO2: 19 mmol/L — ABNORMAL LOW (ref 22–32)
Calcium: 8.3 mg/dL — ABNORMAL LOW (ref 8.9–10.3)
Chloride: 106 mmol/L (ref 98–111)
Creatinine, Ser: 2.35 mg/dL — ABNORMAL HIGH (ref 0.61–1.24)
GFR, Estimated: 27 mL/min — ABNORMAL LOW
Glucose, Bld: 127 mg/dL — ABNORMAL HIGH (ref 70–99)
Potassium: 4 mmol/L (ref 3.5–5.1)
Sodium: 137 mmol/L (ref 135–145)

## 2024-04-09 LAB — CBC
HCT: 32.6 % — ABNORMAL LOW (ref 39.0–52.0)
Hemoglobin: 10.9 g/dL — ABNORMAL LOW (ref 13.0–17.0)
MCH: 30.7 pg (ref 26.0–34.0)
MCHC: 33.4 g/dL (ref 30.0–36.0)
MCV: 91.8 fL (ref 80.0–100.0)
Platelets: 95 K/uL — ABNORMAL LOW (ref 150–400)
RBC: 3.55 MIL/uL — ABNORMAL LOW (ref 4.22–5.81)
RDW: 15.7 % — ABNORMAL HIGH (ref 11.5–15.5)
WBC: 13.8 K/uL — ABNORMAL HIGH (ref 4.0–10.5)
nRBC: 0 % (ref 0.0–0.2)

## 2024-04-09 LAB — PREPARE PLATELET PHERESIS: Unit division: 0

## 2024-04-09 LAB — GLUCOSE, CAPILLARY
Glucose-Capillary: 197 mg/dL — ABNORMAL HIGH (ref 70–99)
Glucose-Capillary: 303 mg/dL — ABNORMAL HIGH (ref 70–99)
Glucose-Capillary: 244 mg/dL — ABNORMAL HIGH (ref 70–99)
Glucose-Capillary: 144 mg/dL — ABNORMAL HIGH (ref 70–99)

## 2024-04-09 MED ORDER — SODIUM CHLORIDE 0.9 % IV SOLN: INTRAVENOUS | Status: AC

## 2024-04-09 MED ORDER — SODIUM CHLORIDE 0.9 % IV BOLUS
500.0000 mL | Freq: Once | INTRAVENOUS | Status: AC
  Administered 2024-04-09: 500 mL via INTRAVENOUS

## 2024-04-09 MED ORDER — MUPIROCIN 2 % EX OINT
TOPICAL_OINTMENT | Freq: Two times a day (BID) | CUTANEOUS | Status: DC
  Administered 2024-04-11: 1 via NASAL
  Filled 2024-04-09 (×4): qty 22

## 2024-04-09 MED ORDER — INSULIN ASPART 100 UNIT/ML IJ SOLN
0.0000 [IU] | Freq: Three times a day (TID) | INTRAMUSCULAR | Status: DC
  Administered 2024-04-09: 5 [IU] via SUBCUTANEOUS
  Administered 2024-04-09 – 2024-04-10 (×2): 8 [IU] via SUBCUTANEOUS
  Administered 2024-04-10: 2 [IU] via SUBCUTANEOUS
  Administered 2024-04-10 – 2024-04-11 (×2): 3 [IU] via SUBCUTANEOUS
  Administered 2024-04-11 (×2): 2 [IU] via SUBCUTANEOUS
  Administered 2024-04-12: 8 [IU] via SUBCUTANEOUS
  Administered 2024-04-12: 3 [IU] via SUBCUTANEOUS
  Administered 2024-04-12: 8 [IU] via SUBCUTANEOUS
  Administered 2024-04-13 (×2): 3 [IU] via SUBCUTANEOUS
  Administered 2024-04-13: 8 [IU] via SUBCUTANEOUS
  Administered 2024-04-14: 5 [IU] via SUBCUTANEOUS
  Administered 2024-04-14: 3 [IU] via SUBCUTANEOUS
  Administered 2024-04-14: 8 [IU] via SUBCUTANEOUS
  Administered 2024-04-15: 5 [IU] via SUBCUTANEOUS
  Administered 2024-04-15 – 2024-04-17 (×5): 3 [IU] via SUBCUTANEOUS
  Administered 2024-04-17: 5 [IU] via SUBCUTANEOUS
  Administered 2024-04-18: 3 [IU] via SUBCUTANEOUS
  Administered 2024-04-18: 5 [IU] via SUBCUTANEOUS
  Administered 2024-04-19: 11 [IU] via SUBCUTANEOUS
  Administered 2024-04-19: 5 [IU] via SUBCUTANEOUS
  Administered 2024-04-20: 3 [IU] via SUBCUTANEOUS
  Administered 2024-04-20: 5 [IU] via SUBCUTANEOUS
  Administered 2024-04-21 (×2): 3 [IU] via SUBCUTANEOUS
  Filled 2024-04-09 (×3): qty 5
  Filled 2024-04-09: qty 8
  Filled 2024-04-09: qty 3
  Filled 2024-04-09: qty 2
  Filled 2024-04-09: qty 8
  Filled 2024-04-09 (×2): qty 3
  Filled 2024-04-09: qty 2
  Filled 2024-04-09 (×5): qty 3
  Filled 2024-04-09: qty 2
  Filled 2024-04-09: qty 5
  Filled 2024-04-09 (×2): qty 3
  Filled 2024-04-09: qty 8
  Filled 2024-04-09 (×4): qty 3
  Filled 2024-04-09: qty 11
  Filled 2024-04-09: qty 5
  Filled 2024-04-09: qty 8
  Filled 2024-04-09: qty 3
  Filled 2024-04-09 (×2): qty 5

## 2024-04-09 NOTE — Plan of Care (Signed)
  Problem: Education: Goal: Knowledge of General Education information will improve Description: Including pain rating scale, medication(s)/side effects and non-pharmacologic comfort measures Outcome: Progressing   Problem: Health Behavior/Discharge Planning: Goal: Ability to manage health-related needs will improve Outcome: Progressing   Problem: Clinical Measurements: Goal: Ability to maintain clinical measurements within normal limits will improve Outcome: Progressing Goal: Will remain free from infection Outcome: Progressing Goal: Diagnostic test results will improve Outcome: Progressing Goal: Respiratory complications will improve Outcome: Progressing Goal: Cardiovascular complication will be avoided Outcome: Progressing   Problem: Activity: Goal: Risk for activity intolerance will decrease Outcome: Progressing   Problem: Nutrition: Goal: Adequate nutrition will be maintained Outcome: Progressing   Problem: Coping: Goal: Level of anxiety will decrease Outcome: Progressing   Problem: Elimination: Goal: Will not experience complications related to bowel motility Outcome: Progressing Goal: Will not experience complications related to urinary retention Outcome: Progressing   Problem: Pain Managment: Goal: General experience of comfort will improve and/or be controlled Outcome: Progressing   Problem: Safety: Goal: Ability to remain free from injury will improve Outcome: Progressing   Problem: Skin Integrity: Goal: Risk for impaired skin integrity will decrease Outcome: Progressing   Problem: Education: Goal: Knowledge of disease or condition will improve Outcome: Progressing Goal: Knowledge of secondary prevention will improve (MUST DOCUMENT ALL) Outcome: Progressing Goal: Knowledge of patient specific risk factors will improve (DELETE if not current risk factor) Outcome: Progressing   Problem: Intracerebral Hemorrhage Tissue Perfusion: Goal: Complications  of Intracerebral Hemorrhage will be minimized Outcome: Progressing   Problem: Coping: Goal: Will verbalize positive feelings about self Outcome: Progressing Goal: Will identify appropriate support needs Outcome: Progressing   Problem: Health Behavior/Discharge Planning: Goal: Ability to manage health-related needs will improve Outcome: Progressing Goal: Goals will be collaboratively established with patient/family Outcome: Progressing   Problem: Self-Care: Goal: Ability to participate in self-care as condition permits will improve Outcome: Progressing Goal: Verbalization of feelings and concerns over difficulty with self-care will improve Outcome: Progressing Goal: Ability to communicate needs accurately will improve Outcome: Progressing   Problem: Nutrition: Goal: Risk of aspiration will decrease Outcome: Progressing Goal: Dietary intake will improve Outcome: Progressing   Problem: Education: Goal: Ability to describe self-care measures that may prevent or decrease complications (Diabetes Survival Skills Education) will improve Outcome: Progressing Goal: Individualized Educational Video(s) Outcome: Progressing   Problem: Coping: Goal: Ability to adjust to condition or change in health will improve Outcome: Progressing   Problem: Fluid Volume: Goal: Ability to maintain a balanced intake and output will improve Outcome: Progressing   Problem: Health Behavior/Discharge Planning: Goal: Ability to identify and utilize available resources and services will improve Outcome: Progressing Goal: Ability to manage health-related needs will improve Outcome: Progressing   Problem: Metabolic: Goal: Ability to maintain appropriate glucose levels will improve Outcome: Progressing   Problem: Nutritional: Goal: Maintenance of adequate nutrition will improve Outcome: Progressing Goal: Progress toward achieving an optimal weight will improve Outcome: Progressing   Problem: Skin  Integrity: Goal: Risk for impaired skin integrity will decrease Outcome: Progressing   Problem: Tissue Perfusion: Goal: Adequacy of tissue perfusion will improve Outcome: Progressing

## 2024-04-09 NOTE — Progress Notes (Signed)
 Physical Therapy Treatment Patient Details Name: Jacob Barnes MRN: [id number] DOB: [birth year] Today's Date: 04/09/2024   History of Present Illness Jacob Barnes is a 83 y.o. male who is brought in to the ED with confusion and generalized weakness. CT head demonstrated IVH centered in the L choroid plexus with layering hemorrhage in BL occipital lobes. PMH: dementia, HTN, HLD, GERD, DM2, osteoporosis, anemia, thrombocytopenia, prior L SDH, retinopathy    PT Comments  Pt with improved BP management this date which improved pt ability to maintain upright standing with tactile cues at knee and posterior hips. Pt reports a little dizziness. Pt able to take steps this date to chair. Pt remains very deconditioned with impaired sequencing, processing, and balance. Pt remains at high falls risk. Pt unsafe to return home without assist during the day and would greatly benefit from intensive inpatient rehab program to address above deficits and achieve safe level of function for safe transition home. Acute PT to cont to follow.   If plan is discharge home, recommend the following: A lot of help with walking and/or transfers;A lot of help with bathing/dressing/bathroom;Assist for transportation;Help with stairs or ramp for entrance;Supervision due to cognitive status   Can travel by private vehicle        Equipment Recommendations   (TBD at next venue)    Recommendations for Other Services Rehab consult     Precautions / Restrictions Precautions Precautions: Fall Recall of Precautions/Restrictions: Impaired Precaution/Restrictions Comments: watch out for orthostatic BP Restrictions Weight Bearing Restrictions Per Provider Order: No     Mobility  Bed Mobility Overal bed mobility: Needs Assistance Bed Mobility: Supine to Sit     Supine to sit: Max assist, +2 for physical assistance     General bed mobility comments: HOB elevated, pt did not initiate movement but i think more so due to  unable to hear properly because once PT and RN started moving pt, pt began to assist    Transfers Overall transfer level: Needs assistance Equipment used: 2 person hand held assist (face to face transfer with gait belt) Transfers: Sit to/from Stand, Bed to chair/wheelchair/BSC Sit to Stand: Max assist, +2 physical assistance Stand pivot transfers: Max assist, +2 physical assistance         General transfer comment: pt with improved ability to achieve more upright posture, bilat feet blocked to prevent sliding forward and tactile cues at posterior hips to promote trunk extension    Ambulation/Gait               General Gait Details: began marching in place x 10 reps with max verbal and tactile cues, assist for weight shift to promote marching   Stairs             Wheelchair Mobility     Tilt Bed    Modified Rankin (Stroke Patients Only) Modified Rankin (Stroke Patients Only) Pre-Morbid Rankin Score: Moderate disability Modified Rankin: Severe disability     Balance Overall balance assessment: Needs assistance Sitting-balance support: Feet supported, No upper extremity supported Sitting balance-Leahy Scale: Poor Sitting balance - Comments: posterior bias   Standing balance support: Bilateral upper extremity supported, During functional activity, Reliant on assistive device for balance Standing balance-Leahy Scale: Zero Standing balance comment: dependent on external support, less resistance today                            Communication Communication Communication: Impaired Factors Affecting Communication: Hearing impaired (  extremely HOH)  Cognition Arousal: Alert Behavior During Therapy: Flat affect   PT - Cognitive impairments: History of cognitive impairments                       PT - Cognition Comments: pt extremely HOH, unsure of cognition at baseline, per chart pt with dementia. Pt very pleasant and able to follow simple  commands. keeps eyes closed but opens to commands but prefers to keep them closed Following commands: Impaired Following commands impaired: Follows one step commands with increased time    Cueing Cueing Techniques: Verbal cues, Tactile cues  Exercises General Exercises - Lower Extremity Long Arc Quad: AROM, 5 reps, Both, Seated Hip Flexion/Marching: AROM, Both, 5 reps, Seated    General Comments General comments (skin integrity, edema, etc.): sytolic bp 111 with HOB at 45 deg, dropped to 94 when EOB and then to 92 s/p standing, s/p 2 min sitting with LEs down pt's systolic bp at 101.      Pertinent Vitals/Pain Pain Assessment Pain Assessment: No/denies pain    Home Living                          Prior Function            PT Goals (current goals can now be found in the care plan section) Acute Rehab PT Goals Patient Stated Goal: didn't state PT Goal Formulation: With patient Time For Goal Achievement: 04/22/24 Potential to Achieve Goals: Fair Progress towards PT goals: Progressing toward goals    Frequency    Min 3X/week      PT Plan      Co-evaluation              AM-PAC PT 6 Clicks Mobility   Outcome Measure  Help needed turning from your back to your side while in a flat bed without using bedrails?: A Lot Help needed moving from lying on your back to sitting on the side of a flat bed without using bedrails?: A Lot Help needed moving to and from a bed to a chair (including a wheelchair)?: A Lot Help needed standing up from a chair using your arms (e.g., wheelchair or bedside chair)?: A Lot Help needed to walk in hospital room?: Total Help needed climbing 3-5 steps with a railing? : Total 6 Click Score: 10    End of Session Equipment Utilized During Treatment: Gait belt Activity Tolerance: Patient tolerated treatment well Patient left: in chair;with call bell/phone within reach;with nursing/sitter in room;with family/visitor present Nurse  Communication: Mobility status PT Visit Diagnosis: Unsteadiness on feet (R26.81);Muscle weakness (generalized) (M62.81);Difficulty in walking, not elsewhere classified (R26.2);History of falling (Z91.81)     Time: 9078-9057 PT Time Calculation (min) (ACUTE ONLY): 21 min  Charges:    $Therapeutic Activity: 8-22 mins PT General Charges $$ ACUTE PT VISIT: 1 Visit                     Norene Ames, PT, DPT Acute Rehabilitation Services Secure chat preferred Office #: [phone]    Norene CHRISTELLA Ames 04/09/2024, 9:51 AM

## 2024-04-09 NOTE — Progress Notes (Addendum)
 STROKE TEAM PROGRESS NOTE    SIGNIFICANT HOSPITAL EVENTS  12/29: Presented to ED with confusion and generalized weakness.   CT head demonstrated IVH. NIH 5.  12/30: EEG shows mild diffuse slowing.  No seizure activity Echo shows concern for severe LVH and cardiac amyloid. Cardiology consulted, no further workup.    INTERIM HISTORY/SUBJECTIVE  Patient lying in bed. RN at bedside.  Son, Jacob Barnes, at bedside-updated on assessment and plan of care.   He is drowsy and confused, but seems to be consistent with previous exam.Son endorses that this is his baseline.  Blood pressure adequately controlled. Will transfer out of ICU.   OBJECTIVE  CBC    Component Value Date/Time   WBC 13.8 (H) 04/09/2024 0334   RBC 3.55 (L) 04/09/2024 0334   HGB 10.9 (L) 04/09/2024 0334   HGB 9.3 (L) 07/13/2023 1645   HCT 32.6 (L) 04/09/2024 0334   HCT 28.6 (L) 07/13/2023 1645   PLT 95 (L) 04/09/2024 0334   PLT 127 (L) 07/13/2023 1645   MCV 91.8 04/09/2024 0334   MCV 94 07/13/2023 1645   MCH 30.7 04/09/2024 0334   MCHC 33.4 04/09/2024 0334   RDW 15.7 (H) 04/09/2024 0334   RDW 14.5 07/13/2023 1645   LYMPHSABS 1.3 04/03/2024 2146   LYMPHSABS 2.4 06/21/2022 1641   MONOABS 1.0 04/03/2024 2146   EOSABS 0.0 04/03/2024 2146   EOSABS 0.0 06/21/2022 1641   BASOSABS 0.0 04/03/2024 2146   BASOSABS 0.0 06/21/2022 1641    BMET    Component Value Date/Time   NA 137 04/09/2024 0334   NA 136 07/13/2023 1645   K 4.0 04/09/2024 0334   CL 106 04/09/2024 0334   CO2 19 (L) 04/09/2024 0334   GLUCOSE 127 (H) 04/09/2024 0334   BUN 53 (H) 04/09/2024 0334   BUN 16 07/13/2023 1645   CREATININE 2.35 (H) 04/09/2024 0334   CREATININE 1.36 (H) 09/30/2019 1008   CALCIUM  8.3 (L) 04/09/2024 0334   EGFR 57 (L) 07/13/2023 1645   GFRNONAA 27 (L) 04/09/2024 0334   GFRNONAA 49 (L) 09/30/2019 1008    IMAGING past 24 hours ECHOCARDIOGRAM COMPLETE Result Date: 04/08/2024    ECHOCARDIOGRAM REPORT   Patient Name:   Jacob Barnes Date of Exam: 04/08/2024 Medical Rec #:  [id number]   Height:       64.0 in Accession #:    [id number]  Weight:       86.4 lb Date of Birth:  17-May-1940   BSA:          1.370 m Patient Age:    83 years    BP:           144/90 mmHg Patient Gender: M           HR:           80 bpm. Exam Location:  Inpatient Procedure: 2D Echo, Cardiac Doppler and Color Doppler (Both Spectral and Color            Flow Doppler were utilized during procedure). Indications:    Stroke  History:        Patient has no prior history of Echocardiogram examinations.                 Stroke, Signs/Symptoms:Edema; Risk Factors:Hypertension,                 Diabetes and Dyslipidemia.  Sonographer:    Juliene Rucks Referring Phys: [id number] [facility] KHALIQDINA IMPRESSIONS  1. Constellation of severe  LVH, diastolic dysfunction, pericardial effusion concerning for infiltrative disease, specifically cardiac amyloid. Left ventricular ejection fraction, by estimation, is 50 to 55%. The left ventricle has low normal function. The left ventricle has no regional wall motion abnormalities. There is severe concentric left ventricular hypertrophy. Left ventricular diastolic parameters are consistent with Grade II diastolic dysfunction (pseudonormalization).  2. Right ventricular systolic function is normal. The right ventricular size is normal. Severely increased right ventricular wall thickness.  3. A small pericardial effusion is present.  4. The mitral valve is normal in structure. No evidence of mitral valve regurgitation. No evidence of mitral stenosis.  5. The aortic valve is normal in structure. There is moderate thickening of the aortic valve. Aortic valve regurgitation is mild. Aortic valve sclerosis is present, with no evidence of aortic valve stenosis.  6. The inferior vena cava is normal in size with greater than 50% respiratory variability, suggesting right atrial pressure of 3 mmHg. FINDINGS  Left Ventricle: Constellation of severe LVH, diastolic  dysfunction, pericardial effusion concerning for infiltrative disease, specifically cardiac amyloid. Left ventricular ejection fraction, by estimation, is 50 to 55%. The left ventricle has low normal function. The left ventricle has no regional wall motion abnormalities. The left ventricular internal cavity size was normal in size. There is severe concentric left ventricular hypertrophy. Left ventricular diastolic parameters are consistent with Grade II diastolic dysfunction (pseudonormalization). Right Ventricle: The right ventricular size is normal. Severely increased right ventricular wall thickness. Right ventricular systolic function is normal. Left Atrium: Left atrial size was normal in size. Right Atrium: Right atrial size was normal in size. Pericardium: A small pericardial effusion is present. Mitral Valve: The mitral valve is normal in structure. No evidence of mitral valve regurgitation. No evidence of mitral valve stenosis. Tricuspid Valve: The tricuspid valve is normal in structure. Tricuspid valve regurgitation is not demonstrated. No evidence of tricuspid stenosis. Aortic Valve: The aortic valve is normal in structure. There is moderate thickening of the aortic valve. Aortic valve regurgitation is mild. Aortic valve sclerosis is present, with no evidence of aortic valve stenosis. Pulmonic Valve: The pulmonic valve was normal in structure. Pulmonic valve regurgitation is not visualized. No evidence of pulmonic stenosis. Aorta: The aortic root is normal in size and structure. Venous: The inferior vena cava is normal in size with greater than 50% respiratory variability, suggesting right atrial pressure of 3 mmHg. IAS/Shunts: The interatrial septum was not well visualized.  LEFT VENTRICLE PLAX 2D LVIDd:         2.90 cm   Diastology LVIDs:         2.30 cm   LV e' medial:    3.81 cm/s LV PW:         1.60 cm   LV E/e' medial:  16.5 LV IVS:        1.90 cm   LV e' lateral:   5.22 cm/s LVOT diam:     1.80 cm    LV E/e' lateral: 12.0 LV SV:         28 LV SV Index:   20 LVOT Area:     2.54 cm  RIGHT VENTRICLE RV S prime:     9.57 cm/s TAPSE (M-mode): 1.1 cm LEFT ATRIUM             Index        RIGHT ATRIUM          Index LA diam:        2.00 cm 1.46 cm/m  RA Area:     9.90 cm LA Vol (A2C):   22.6 ml 16.50 ml/m  RA Volume:   21.00 ml 15.33 ml/m LA Vol (A4C):   11.4 ml 8.32 ml/m LA Biplane Vol: 16.9 ml 12.34 ml/m  AORTIC VALVE LVOT Vmax:   61.60 cm/s LVOT Vmean:  39.400 cm/s LVOT VTI:    0.110 m  AORTA Ao Root diam: 3.30 cm MITRAL VALVE MV Area (PHT): 3.91 cm    SHUNTS MV Decel Time: 194 msec    Systemic VTI:  0.11 m MV E velocity: 62.90 cm/s  Systemic Diam: 1.80 cm MV A velocity: 88.00 cm/s MV E/A ratio:  0.71 Morene Brownie Electronically signed by Morene Brownie Signature Date/Time: 04/08/2024/11:57:51 AM    Final     Vitals:   04/09/24 0500 04/09/24 0600 04/09/24 0700 04/09/24 0800  BP: 98/62 105/67 93/65 98/61   Pulse: 90 93 86 91  Resp: 15 17 16 13   Temp:    100 F (37.8 C)  TempSrc:    Axillary  SpO2: 98% 98% 97% 98%  Weight:      Height:       PHYSICAL EXAM General:  Alert, elderly, thin patient in no acute distress Psych:  Mood and affect appropriate for situation CV: Regular rate and rhythm on monitor Respiratory:  Regular, unlabored respirations on room air GI: Abdomen soft and nontender  NEURO:  Mental Status: Awake, alert.  Oriented to self, age.  Disoriented to place and time.  Cranial Nerves:  II: PERRL.  Blinks to threat bilaterally.  Inconsistent responses for visual field testing. III, IV, VI: EOMI. Eyelids elevate symmetrically.  V: Sensation is intact to light touch and symmetrical to face.  VII: Face is symmetrical resting and smiling VIII: hearing intact to voice. IX, X: Palate elevates symmetrically.  Mild dysarthria KP:Dynloizm shrug 5/5. XII: tongue is midline without fasciculations. Motor: 4+/5 throughout.  Generalized weakness Tone: is normal and bulk is  normal Sensation- Intact to light touch bilaterally. Extinction absent to light touch to DSS.   Coordination: FTN intact bilaterally, chronic bilateral hand contractures present Gait- deferred  Most Recent NIH 4   ASSESSMENT/PLAN  Jacob Barnes is a 83 y.o. male with history of HTN, HLD, DM2, iron -deficiency anemia, thrombocytopenia, prior left subdural, retinopathy, GERD who presented to ED due to confusion and generalized weakness.  Patient's daughter states that he is able to ambulate with his cane or walker at baseline.  Family also had concerns for an unwitnessed fall and increased confusion since around Christmas.  CT head demonstrated IVH centered in left choroid plexus with layering hemorrhage in bilateral occipital lobes.  Admitted to ICU for close monitoring NIH on Admission 5  Nontraumatic intracerebral hemorrhage, intraventricular Etiology: Secondary to thrombocytopenia,?  Unwitnessed fall CT head  Intraventricular hemorrhage centered in the left choroid plexus with layering hemorrhage in the right greater than left bilateral occipital horns. Increased temporal horn caliber since prior CT may signify hydrocephalus. Trace right temporal extra-axial hemorrhage compatible with a small subdural hemorrhage. Trace subarachnoid hemorrhage along the left sylvian fissures which may represent redistribution. Decreased size of a 1-2 mm chronic left parietal convexity subdural hematoma. CTA head & neck  No large vessel occlusion or aneurysm in the head or neck. Mild atherosclerosis as above. No high-grade stenosis. MRI   Intraventricular hemorrhage involving the left choroid plexus with layering hemorrhage in the right greater than left bilateral occipital horns. This could be due to an intraventricular tumor/ metastasis, vascular malformation,  or hypertension. MRI brain with IV contrast would be useful to further assess. Increased ventricular caliber may signify a component of  hydrocephalus. Left transverse dural venous sinus signal abnormality may reflect slow flow, though dural venous sinus thrombosis is not excluded. MRV would be useful to further assess. Questionable foci of restricted diffusion in the left periventricular white matter and left corpus callosum could represent ischemic infarction or cytotoxic/transependymal edema given adjacent choroid plexus hemorrhage. Scattered subarachnoid hemorrhage may represent redistribution of intraventricular hemorrhage. Possible trace right temporal convexity foci of extra-axial hemorrhage. Decreased size of 2 mm left parietal convexity chronic subdural hematoma. EEG:  Intermittent slow, generalized This study is suggestive of mild diffuse encephalopathy. No seizures or epileptiform discharges were seen throughout the recording. 2D Echo  EF 50 to 55%, Constellation of severe LVH concerning for infiltrative disease, specifically cardiac amyloid, severely increased RV wall thickness Cardiology Consult recommend outpatient evaluation for cardiac amyloidosis, follow-up scheduled with Dr. Lavona on 05/06/2024. LDL 11 HgbA1c 8.3 VTE prophylaxis - SCDs No antithrombotic prior to admission, continue No antithrombotic due to ICH Therapy recommendations:  CIR Disposition:  pending  Hx of Stroke/TIA 03/2022:  Presented with worsening aphasia, hemianopia.  CT scan on admission showed left cerebral subdural hematoma with mass effect 4 - 5 millimeters rightward midline shift.   Repeat CT scan stable.   S/p successful Onyx embolization of left middle meningeal artery 12/13 by neurosurgery.  No further follow-ups seen on chart review Home meds: Keppra  50mg  BID prophylaxis, continued  Hypertension Home meds: none (per family, 2.5mg  Norvasc  was discontinued by provider) Stable BP goal: < 160  Hyperlipidemia Home meds: Lipitor 10 mg LDL 11, goal < 70 Continue statin at discharge.  Continued management per PCP  Diabetes  type II Uncontrolled Home meds: Insulin  Januvia  discontinued by provider per family HgbA1c 8.3, goal < 7.0 Continue CBGs and SSI Recommend close follow-up with PCP for better DM control  Other Stroke Risk Factors Family hx stroke (mother)  Other Active Problems Concern for Malnutrition BMI 14.3 Supplemental shakes added RD consult added Hx of Anemia, Pancytopenia Continue daily labs Followed outpatient by Hem/Onc Platelets improved today from 64 to 95  Hospital day # 2   Pt seen by Neuro NP/APP with MD. Note/plan to be edited by MD as needed.    Jacob JAYSON Likes, DNP [facility] Please use AMION for contact information & EPIC for messaging.  I have personally obtained history,examined this patient, reviewed notes, independently viewed imaging studies, participated in medical decision making and plan of care.ROS completed by me personally and pertinent positives fully documented  I have made any additions or clarifications directly to the above note. Agree with note above.  Patient neurological exam is stable.  Blood pressure adequately controlled.  Plan to transfer out of ICU.  Appreciate cardiology consult.  Will ask medical hospitalist team consult and assume care.  Long discussion with patient and son at the bedside and answered questions.  I personally spent a total of 50 minutes in the care of the patient today including getting/reviewing separately obtained history, performing a medically appropriate exam/evaluation, counseling and educating, placing orders, referring and communicating with other health care professionals, documenting clinical information in the EHR, independently interpreting results, and coordinating care.         Eather Popp, MD Medical Director [facility] Pager: [phone] 04/09/2024 3:27 PM  To contact Stroke Continuity provider, please refer to [email or web address]. After hours, contact General Neurology

## 2024-04-09 NOTE — Progress Notes (Signed)
 Pt transferred to [address] with his son and all belongings. Pt placed on monitor, bed in low locked position, call bell in reach. [facility] staff notified of pt's arrival.

## 2024-04-09 NOTE — Plan of Care (Signed)
" °  Problem: Clinical Measurements: Goal: Ability to maintain clinical measurements within normal limits will improve Outcome: Progressing Goal: Respiratory complications will improve Outcome: Progressing Goal: Cardiovascular complication will be avoided Outcome: Progressing   Problem: Activity: Goal: Risk for activity intolerance will decrease Outcome: Progressing   Problem: Coping: Goal: Level of anxiety will decrease Outcome: Progressing   Problem: Elimination: Goal: Will not experience complications related to bowel motility Outcome: Progressing Goal: Will not experience complications related to urinary retention Outcome: Progressing   Problem: Pain Managment: Goal: General experience of comfort will improve and/or be controlled Outcome: Progressing   Problem: Safety: Goal: Ability to remain free from injury will improve Outcome: Progressing   Problem: Education: Goal: Knowledge of General Education information will improve Description: Including pain rating scale, medication(s)/side effects and non-pharmacologic comfort measures Outcome: Not Progressing   Problem: Health Behavior/Discharge Planning: Goal: Ability to manage health-related needs will improve Outcome: Not Progressing   Problem: Clinical Measurements: Goal: Will remain free from infection Outcome: Not Progressing   Problem: Nutrition: Goal: Adequate nutrition will be maintained Outcome: Not Progressing   Problem: Skin Integrity: Goal: Risk for impaired skin integrity will decrease Outcome: Not Progressing   Problem: Education: Goal: Knowledge of disease or condition will improve Outcome: Not Progressing   "

## 2024-04-09 NOTE — Progress Notes (Signed)

## 2024-04-09 NOTE — TOC Initial Note (Addendum)
 Transition of Care [facility]) - Initial/Assessment Note    Patient Details  Name: Jacob Barnes MRN: [id number] Date of Birth: June 30, 1940  [facility] CM/SW Contact:    Roxie KANDICE Stain, RN Phone Number: 04/09/2024, 2:45 PM  Clinical Narrative:                  Per chart review patient lives with daughter and has history of dementia. Patient has walker and cane.  PCP reviewed.  PT, OT recs for inpatient rehab.  Spoke to daughter who is interested in CIR.  Expected Discharge Plan: IP Rehab Facility Barriers to Discharge: Continued Medical Work up   Patient Goals and CMS Choice            Expected Discharge Plan and Services       Living arrangements for the past 2 months: Single Family Home                                      Prior Living Arrangements/Services Living arrangements for the past 2 months: Single Family Home Lives with:: Adult Children                   Activities of Daily Living      Permission Sought/Granted                  Emotional Assessment       Orientation: : Oriented to Self Alcohol / Substance Use: Not Applicable Psych Involvement: No (comment)  Admission diagnosis:  ICH (intracerebral hemorrhage) (HCC) [I61.9] Intraventricular hemorrhage (HCC) [I61.5] Patient Active Problem List   Diagnosis Date Noted   Pressure injury of skin 04/08/2024   Intraventricular hemorrhage (HCC) 04/07/2024   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   Normocytic anemia 09/07/2023   Thrombocytopenia 09/07/2023   Elevated serum immunoglobulin free light chains 09/07/2023   Dry eyes 06/16/2023   Kidney stones 06/16/2023   Diabetes mellitus with complication, with long-term current use of insulin  (HCC) 04/05/2023   Malnutrition of moderate degree 02/11/2023   Opacity of lung on imaging study 02/11/2023   Hearing loss of left ear due to cerumen impaction 04/24/2022   Sudden idiopathic hearing loss, unspecified ear 04/24/2022    Subdural hematoma (HCC) 03/19/2022   Allergy to alpha-gal 03/13/2022   Loss of weight 12/04/2021   Hyperlipidemia LDL goal <100 10/24/2021   Traumatic retroperitoneal hematoma 05/03/2021   Dementia (HCC) 01/24/2021   Chronic diarrhea 06/18/2020   At high risk for falls 04/09/2020   Uncontrolled type 2 diabetes mellitus with hyperglycemia (HCC) 01/28/2020   Osteopenia 01/28/2020   Compression fracture of L1 lumbar vertebra (HCC) 01/09/2020   After-cataract with vision obscured 02/10/2019   Diabetic macular edema (HCC) 02/10/2019   Dry eye syndrome of bilateral lacrimal glands 02/10/2019   Lens replaced by other means 02/10/2019   Open-angle glaucoma 02/10/2019   Presence of intraocular lens 02/10/2019   Unspecified visual loss 02/10/2019   Type 2 diabetes mellitus with proliferative retinopathy of both eyes and macular edema (HCC) 02/10/2019   Type 2 diabetes mellitus with vascular disease (HCC) 10/20/2018   Bilateral pseudophakia 02/08/2016   Proliferative retinopathy with retinal edema due to type 2 diabetes mellitus (HCC) 02/08/2016   Primary open angle glaucoma of both eyes 02/08/2016   Iron  deficiency anemia 01/10/2014   CKD (chronic kidney disease) stage 3, GFR 30-59 ml/min (HCC) 12/15/2013   AKI (acute kidney  injury) 12/11/2013   Anemia 12/11/2013   Mild protein-calorie malnutrition 12/11/2013   GERD (gastroesophageal reflux disease) 06/27/2012   Background diabetic retinopathy (HCC) 03/28/2012   Retinal edema 03/28/2012   Tear film insufficiency 08/07/2011   Borderline glaucoma, open angle with borderline findings 03/22/2011   Essential hypertension, benign 01/02/2009   PCP:  [name], MD Pharmacy:   [facility] [phone] - [place], [place] - [address] AT [employer] [address] [name] [address] Phone: [phone] Fax: [phone]     Social Drivers of Health (SDOH) Social History: SDOH Screenings   Food Insecurity: Food  Insecurity Present (02/05/2024)  Housing: Low Risk (02/05/2024)  Transportation Needs: No Transportation Needs (02/05/2024)  Utilities: Not At Risk (02/05/2024)  Alcohol Screen: Low Risk (02/05/2024)  Depression (PHQ2-9): Low Risk (02/22/2024)  Financial Resource Strain: Patient Declined (02/05/2024)  Physical Activity: Inactive (02/05/2024)  Social Connections: Moderately Integrated (02/05/2024)  Stress: No Stress Concern Present (02/05/2024)  Tobacco Use: Low Risk (04/07/2024)  Health Literacy: Adequate Health Literacy (02/05/2024)   SDOH Interventions:     Readmission Risk Interventions     No data to display

## 2024-04-09 NOTE — Progress Notes (Signed)
 Initial Nutrition Assessment  DOCUMENTATION CODES:   Severe malnutrition in context of chronic illness, Underweight  INTERVENTION:   Encourage PO intake at meals  Modified diet for Alpha gal allergy  Add chopped meats for ease of chewing.   Continue Ensure Plus High Protein po BID, each supplement provides 350 kcal and 20 grams of protein.   NUTRITION DIAGNOSIS:   Severe Malnutrition related to chronic illness as evidenced by severe fat depletion, severe muscle depletion.  GOAL:   Patient will meet greater than or equal to 90% of their needs  MONITOR:   TF tolerance  REASON FOR ASSESSMENT:   Consult Assessment of nutrition requirement/status  ASSESSMENT:   Pt with PMH of HTN, HLD, DM, iron -deficiency anemia, thrombocytopenia, prior L SDH, retinopathy, GERD admitted with nontraumatic ICH.   Pt discussed during ICU rounds and with RN and MD.  Jacob Barnes with pt and his son. Pt lives with his daughter. Possibly uses a walker at baseline. Per son appetite is poor. Per son he is not very familiar with pt's alpha gal allergy but states he started to have to follow this a couple of years ago.  Unable to confirm with pt how he tolerates dairy.   Per son pt ate <50% of his breakfast this am, noted pt did receive milk on his meal tray.   Medications reviewed and include: ensure plus high protein, SSI TID with meals and bedtime, MVI with minerals NS @ 75 ml/hr   Labs reviewed:  Folate 14 Vitmain B12 1474 CBG's: 197-303  NUTRITION - FOCUSED PHYSICAL EXAM:  Flowsheet Row Most Recent Value  Orbital Region Severe depletion  Upper Arm Region Severe depletion  Thoracic and Lumbar Region Severe depletion  Buccal Region Severe depletion  Temple Region Severe depletion  Clavicle Bone Region Severe depletion  Clavicle and Acromion Bone Region Severe depletion  Scapular Bone Region Severe depletion  Dorsal Hand Severe depletion  Patellar Region Severe depletion  Anterior Thigh  Region Severe depletion  Posterior Calf Region Severe depletion  Edema (RD Assessment) None  Hair Reviewed  Eyes Reviewed  Mouth Reviewed  [upper dentures,  missing lower teeth]  Skin Reviewed  Nails Reviewed    Diet Order:   Diet Order             Diet regular Room service appropriate? Yes with Assist; Fluid consistency: Thin  Diet effective now                   EDUCATION NEEDS:   Education needs have been addressed  Skin:  Skin Assessment: Skin Integrity Issues: Skin Integrity Issues:: Stage I Stage I: sacrum  Last BM:  12/30 fype 7  Height:   Ht Readings from Last 1 Encounters:  04/07/24 5' 4 (1.626 m)    Weight:   Wt Readings from Last 1 Encounters:  04/07/24 39.2 kg    BMI:  Body mass index is 14.83 kg/m.  Estimated Nutritional Needs:   Kcal:  1200-1300  Protein:  60-70 grams  Fluid:  >1.5 L/day   Powell SQUIBB., RD, LDN, CNSC See AMiON for contact information

## 2024-04-09 NOTE — Progress Notes (Addendum)
 "  Progress Note  Patient Name: Jacob Barnes Date of Encounter: 04/09/2024 [facility] Cardiologist: None   Interval Summary   Sitting up in chair during the interview.  Patient's son was present for the interview.  Denies any chest pain, shortness of breath, orthopnea, lower extremity edema, PND, abdominal distention.  Vital Signs Vitals:   04/09/24 0500 04/09/24 0600 04/09/24 0700 04/09/24 0800  BP: 98/62 105/67 93/65 98/61   Pulse: 90 93 86 91  Resp: 15 17 16 13   Temp:    100 F (37.8 C)  TempSrc:    Axillary  SpO2: 98% 98% 97% 98%  Weight:      Height:        Intake/Output Summary (Last 24 hours) at 04/09/2024 0938 Last data filed at 04/09/2024 0800 Gross per 24 hour  Intake 1920 ml  Output 800 ml  Net 1120 ml      04/07/2024    8:20 PM 04/07/2024    9:49 AM 04/03/2024    8:31 PM  Last 3 Weights  Weight (lbs) 86 lb 6.7 oz 93 lb 4.1 oz 93 lb 4.1 oz  Weight (kg) 39.2 kg 42.3 kg 42.3 kg      Telemetry/ECG  NSR 90-110 BPM - Personally Reviewed  Physical Exam  GEN: No acute distress.  Patient was confused about where he was at but knew his name and date of birth. Neck: No JVD Cardiac: RRR, no murmurs, rubs, or gallops.  Respiratory: Clear to auscultation bilaterally. GI: Soft, nontender, non-distended  MS: No edema  Assessment & Plan  Jacob Barnes is a 83 y.o. male with a hx of hypertension, hyperlipidemia, GERD, type 2 diabetes, anemia, osteoporosis, anemia, thrombocytopenia, prior left subdural hemorrhage, retinopathy,  who is being seen for the evaluation of abnormal echocardiogram.  Anemia Elevated free light chains. Is followed by oncology.  They consider doing a bone marrow biopsy to rule out multiple myeloma or MDS.  Oncology decided against a biopsy as they felt patient is not a candidate for treatment of multiple myeloma.   Altered mental status CVA Patient was hospitalized for weakness and altered mental status.  Imaging showed an  intracerebral hemorrhage.  It was suspected that this may be secondary to thrombocytopenia.  Patient had also had a recent fall at home several days ago.  It appears like there are also some concerns for intracranial hypertension.  The patient is being followed by neurology. Management per neurology   Severe LVH Small pericardial effusion TTE on 04/08/2024 showed a normal LVEF of 50 to 55%, no RWMA, severe LVH G2 DD, normal RV systolic function, increased RV wall thickness, small pericardial effusion, moderate aortic valve thickening, mild aortic regurgitation, and IVC with greater than 50% respiratory variability.  The patient was felt to possibly have cardiac amyloidosis. Patient was felt to be euvolemic on exam. Recommend outpatient evaluation for cardiac amyloidosis Scheduled follow-up appointment with Dr. Lavona on 05/07/2023.   Otherwise management per primary  Suspect [facility] will sign off.    For questions or updates, please contact [facility] Please consult [email or web address] for contact info under    Signed, Morse Clause, PA-C   History and all data above reviewed.  I personally took the history today, performed the physical exam and formulated the assessment and plan.  I reviewed all relevant tests and studies and performed the substantive portion of the exam and A/P. Patient examined.  I agree with the findings as above.  He answers  questions and denies pain or SOB. The patient exam reveals COR:RRR  ,  Lungs: Clear  ,  Abd: Positive bowel sounds, no rebound no guarding, Ext No edema   .  All available labs, radiology testing, previous records reviewed. Agree with documented assessment and plan.   Probably amyloid possibly related to MM but plan is conservative therapy given his acute neurologic issue and overall failure to thrive.  I will follow up after discharge to see if he has improved and to consider further testing.  However, the good news is that he has no  overt HF symptoms with his obvious diastolic dysfunction.  No change in therapy.     We will sign off.  Lynwood Schilling  10:39 AM  04/09/2024  "

## 2024-04-10 DIAGNOSIS — D696 Thrombocytopenia, unspecified: Secondary | ICD-10-CM | POA: Diagnosis not present

## 2024-04-10 DIAGNOSIS — I615 Nontraumatic intracerebral hemorrhage, intraventricular: Secondary | ICD-10-CM | POA: Diagnosis not present

## 2024-04-10 DIAGNOSIS — R29704 NIHSS score 4: Secondary | ICD-10-CM | POA: Diagnosis not present

## 2024-04-10 DIAGNOSIS — I611 Nontraumatic intracerebral hemorrhage in hemisphere, cortical: Secondary | ICD-10-CM | POA: Diagnosis not present

## 2024-04-10 LAB — GLUCOSE, CAPILLARY
Glucose-Capillary: 142 mg/dL — ABNORMAL HIGH (ref 70–99)
Glucose-Capillary: 171 mg/dL — ABNORMAL HIGH (ref 70–99)
Glucose-Capillary: 300 mg/dL — ABNORMAL HIGH (ref 70–99)
Glucose-Capillary: 212 mg/dL — ABNORMAL HIGH (ref 70–99)

## 2024-04-10 LAB — CBC
HCT: 32 % — ABNORMAL LOW (ref 39.0–52.0)
Hemoglobin: 10.7 g/dL — ABNORMAL LOW (ref 13.0–17.0)
MCH: 30.2 pg (ref 26.0–34.0)
MCHC: 33.4 g/dL (ref 30.0–36.0)
MCV: 90.4 fL (ref 80.0–100.0)
Platelets: 90 K/uL — ABNORMAL LOW (ref 150–400)
RBC: 3.54 MIL/uL — ABNORMAL LOW (ref 4.22–5.81)
RDW: 15.7 % — ABNORMAL HIGH (ref 11.5–15.5)
WBC: 9.2 K/uL (ref 4.0–10.5)
nRBC: 0 % (ref 0.0–0.2)

## 2024-04-10 LAB — VITAMIN B1: Vitamin B1 (Thiamine): 104.4 nmol/L (ref 66.5–200.0)

## 2024-04-10 MED ORDER — DORZOLAMIDE HCL-TIMOLOL MAL 2-0.5 % OP SOLN
1.0000 [drp] | Freq: Two times a day (BID) | OPHTHALMIC | Status: DC
  Administered 2024-04-10 – 2024-04-21 (×22): 1 [drp] via OPHTHALMIC
  Filled 2024-04-10: qty 10

## 2024-04-10 NOTE — Progress Notes (Signed)
 STROKE TEAM PROGRESS NOTE    SIGNIFICANT HOSPITAL EVENTS  12/29: Presented to ED with confusion and generalized weakness.   CT head demonstrated IVH. NIH 5.  12/30: EEG shows mild diffuse slowing.  No seizure activity Echo shows concern for severe LVH and cardiac amyloid. Cardiology consulted, no further workup.    INTERIM HISTORY/SUBJECTIVE  Patient lying in bed.   Son, [name], at bedside-updated on assessment and plan of care.   He is alert and confused, but seems to be consistent with previous exam.  Blood pressure adequately controlled. Therapies recommend inpatient rehab  OBJECTIVE  CBC    Component Value Date/Time   WBC 9.2 04/10/2024 0207   RBC 3.54 (L) 04/10/2024 0207   HGB 10.7 (L) 04/10/2024 0207   HGB 9.3 (L) 07/13/2023 1645   HCT 32.0 (L) 04/10/2024 0207   HCT 28.6 (L) 07/13/2023 1645   PLT 90 (L) 04/10/2024 0207   PLT 127 (L) 07/13/2023 1645   MCV 90.4 04/10/2024 0207   MCV 94 07/13/2023 1645   MCH 30.2 04/10/2024 0207   MCHC 33.4 04/10/2024 0207   RDW 15.7 (H) 04/10/2024 0207   RDW 14.5 07/13/2023 1645   LYMPHSABS 1.3 04/03/2024 2146   LYMPHSABS 2.4 06/21/2022 1641   MONOABS 1.0 04/03/2024 2146   EOSABS 0.0 04/03/2024 2146   EOSABS 0.0 06/21/2022 1641   BASOSABS 0.0 04/03/2024 2146   BASOSABS 0.0 06/21/2022 1641    BMET    Component Value Date/Time   NA 137 04/09/2024 0334   NA 136 07/13/2023 1645   K 4.0 04/09/2024 0334   CL 106 04/09/2024 0334   CO2 19 (L) 04/09/2024 0334   GLUCOSE 127 (H) 04/09/2024 0334   BUN 53 (H) 04/09/2024 0334   BUN 16 07/13/2023 1645   CREATININE 2.35 (H) 04/09/2024 0334   CREATININE 1.36 (H) 09/30/2019 1008   CALCIUM  8.3 (L) 04/09/2024 0334   EGFR 57 (L) 07/13/2023 1645   GFRNONAA 27 (L) 04/09/2024 0334   GFRNONAA 49 (L) 09/30/2019 1008    IMAGING past 24 hours No results found.   Vitals:   04/09/24 2047 04/10/24 0017 04/10/24 0444 04/10/24 0813  BP: 126/71 129/77 130/79 129/82  Pulse: 79 71 68 67   Resp: 17 13 13 16   Temp: 98.9 F (37.2 C) 98 F (36.7 C) 97.9 F (36.6 C) 97.7 F (36.5 C)  TempSrc: Oral Oral Oral Oral  SpO2: 100% 96% 100% 100%  Weight:      Height:       PHYSICAL EXAM General:  Alert, elderly, thin patient in no acute distress Psych:  Mood and affect appropriate for situation CV: Regular rate and rhythm on monitor Respiratory:  Regular, unlabored respirations on room air GI: Abdomen soft and nontender  NEURO:  Mental Status: Awake, alert.  Oriented to self, age.  Disoriented to place and time.  Cranial Nerves:  II: PERRL.  Blinks to threat bilaterally.  Inconsistent responses for visual field testing. III, IV, VI: EOMI. Eyelids elevate symmetrically.  V: Sensation is intact to light touch and symmetrical to face.  VII: Face is symmetrical resting and smiling VIII: hearing intact to voice. IX, X: Palate elevates symmetrically.  Mild dysarthria KP:Dynloizm shrug 5/5. XII: tongue is midline without fasciculations. Motor: 4+/5 throughout.  Generalized weakness Tone: is normal and bulk is normal Sensation- Intact to light touch bilaterally. Extinction absent to light touch to DSS.   Coordination: FTN intact bilaterally, chronic bilateral hand contractures present Gait- deferred  Most Recent NIH  4   ASSESSMENT/PLAN  [name] is a 84 y.o. male with history of HTN, HLD, DM2, iron -deficiency anemia, thrombocytopenia, prior left subdural, retinopathy, GERD who presented to ED due to confusion and generalized weakness.  Patient's daughter states that he is able to ambulate with his cane or walker at baseline.  Family also had concerns for an unwitnessed fall and increased confusion since around Christmas.  CT head demonstrated IVH centered in left choroid plexus with layering hemorrhage in bilateral occipital lobes.  Admitted to ICU for close monitoring NIH on Admission 5  Nontraumatic intracerebral hemorrhage, intraventricular Etiology: Secondary to  thrombocytopenia,?  Unwitnessed fall CT head  Intraventricular hemorrhage centered in the left choroid plexus with layering hemorrhage in the right greater than left bilateral occipital horns. Increased temporal horn caliber since prior CT may signify hydrocephalus. Trace right temporal extra-axial hemorrhage compatible with a small subdural hemorrhage. Trace subarachnoid hemorrhage along the left sylvian fissures which may represent redistribution. Decreased size of a 1-2 mm chronic left parietal convexity subdural hematoma. CTA head & neck  No large vessel occlusion or aneurysm in the head or neck. Mild atherosclerosis as above. No high-grade stenosis. MRI   Intraventricular hemorrhage involving the left choroid plexus with layering hemorrhage in the right greater than left bilateral occipital horns. This could be due to an intraventricular tumor/ metastasis, vascular malformation, or hypertension. MRI brain with IV contrast would be useful to further assess. Increased ventricular caliber may signify a component of hydrocephalus. Left transverse dural venous sinus signal abnormality may reflect slow flow, though dural venous sinus thrombosis is not excluded. MRV would be useful to further assess. Questionable foci of restricted diffusion in the left periventricular white matter and left corpus callosum could represent ischemic infarction or cytotoxic/transependymal edema given adjacent choroid plexus hemorrhage. Scattered subarachnoid hemorrhage may represent redistribution of intraventricular hemorrhage. Possible trace right temporal convexity foci of extra-axial hemorrhage. Decreased size of 2 mm left parietal convexity chronic subdural hematoma. EEG:  Intermittent slow, generalized This study is suggestive of mild diffuse encephalopathy. No seizures or epileptiform discharges were seen throughout the recording. 2D Echo  EF 50 to 55%, Constellation of severe LVH concerning for infiltrative  disease, specifically cardiac amyloid, severely increased RV wall thickness Cardiology Consult recommend outpatient evaluation for cardiac amyloidosis, follow-up scheduled with Dr. Lavona on 05/06/2024. LDL 11 HgbA1c 8.3 VTE prophylaxis - SCDs No antithrombotic prior to admission, continue No antithrombotic due to ICH Therapy recommendations:  CIR Disposition:  pending  Hx of Stroke/TIA 03/2022:  Presented with worsening aphasia, hemianopia.  CT scan on admission showed left cerebral subdural hematoma with mass effect 4 - 5 millimeters rightward midline shift.   Repeat CT scan stable.   S/p successful Onyx embolization of left middle meningeal artery 12/13 by neurosurgery.  No further follow-ups seen on chart review Home meds: Keppra  50mg  BID prophylaxis, continued  Hypertension Home meds: none (per family, 2.5mg  Norvasc  was discontinued by provider) Stable BP goal: < 160  Hyperlipidemia Home meds: Lipitor 10 mg LDL 11, goal < 70 Continue statin at discharge.  Continued management per PCP  Diabetes type II Uncontrolled Home meds: Insulin  Januvia  discontinued by provider per family HgbA1c 8.3, goal < 7.0 Continue CBGs and SSI Recommend close follow-up with PCP for better DM control  Other Stroke Risk Factors Family hx stroke (mother)  Other Active Problems Concern for Malnutrition BMI 14.3 Supplemental shakes added RD consult added Hx of Anemia, Pancytopenia Continue daily labs Followed outpatient by Hem/Onc Platelets  improved today from 64 to 95  Hospital day # 3     Patient neurological exam is stable.  Blood pressure adequately controlled.  Patient is now transferred to medical hospitalist team for ongoing care.  Long discussion with patient and son at the bedside and answered questions.   Transfer to rehab when bed available.  Stroke team will sign off.  Kindly call for questions.       Eather Popp, MD Medical Director [facility] Pager:  [phone] 04/10/2024 10:12 AM  To contact Stroke Continuity provider, please refer to [email or web address]. After hours, contact General Neurology

## 2024-04-10 NOTE — Progress Notes (Signed)
" °  Progress Note   Patient: Jacob Barnes FMW:[id number] DOB: 08-31-1940 DOA: 04/07/2024     3 DOS: the patient was seen and examined on 04/10/2024 at 9:55 AM      Brief hospital course: 84 y.o. M with dementia, lives at home, HTN, DM, malnutrition, CKD IV baseline 1.9-2.1 who presented with small intracranial hemorrhage.     Assessment and Plan: Nontraumatic small subarachnoid hemorrhage Small intraventricular hemorrhage Small subdural hematoma Patient had had fall several days before Christmas.  Came to ER on Christmas for flank pain, found to have kidney stone.  Over next few days, family noticed he seemed more off, weaker, brought him to the ER. There, CT head showed hemorrhages.  Neurology were consulted, admitted to ICU.  Repeat CT imaging showed no clinically significant change of hemorrhages.  EEG unremarkable, echocardiogram showed severe LVH, but no embolic source LDL 11, hemoglobin A1c 8.3% - PT/OT - Avoid aspirin  - Continue Lipitor, Keppra    Severe protein calorie malnutrition - Resume mirtazapine - Continue feeding supplements - Recommend palliative care to follow  Chronic kidney disease stage IV Creatinine stable relative to baseline, suspect GFR significantly lower than predicted baseline creatinine given his severe muscle wasting.  Severe LVH Small pericardial effusion This is an incidental finding in the hospital on echocardiogram.  Cardiology were consulted, they recommended outpatient follow-up - Follow-up with cardiologist outpatient, pending goals of care  Acute metabolic encephalopathy on dementia -Delirium precautions - Continue donepezil   Cerebrovascular disease Hypertension Hyperlipidemia Blood pressure high normal - Continue amlodipine  - Continue Lipitor  History of seizures - Continue Keppra   Diabetes with hyperglycemia and chronic kidney disease Glucose controlled in the last 24 hours - Continue sliding scale corrections - Hold home  degludec/glargine, and Januvia   Anemia Thrombocytopenia Elevated serum free light chains Follows with Dr. Davonna at the [facility] cancer center.  He is not a candidate for chemotherapy, and so decision to biopsy his anemia, thrombocytopenia, and elevated free light chains has been deferred.  Platelets 61K on admission, transfused platelets, increased to 90K here.   Certainly may just be anemia of chronic kidney disease  Nephrolithiasis Kidney stone found week prior to admission, asymptomatic at present.  Microscopic hematuria on UA is from that, infection ruled out        Subjective: Patient is unable to articulate any concerns.  Son has no concerns, nursing report no new changes.  No fever, no respiratory distress.     Physical Exam: BP (!) 147/83 (BP Location: Right Arm)   Pulse 73   Temp 98 F (36.7 C) (Oral)   Resp 16   Ht 5' 4 (1.626 m)   Wt 39.2 kg   SpO2 100%   BMI 14.83 kg/m   Cachectic elderly male, lying in bed on right side, opens eyes and attempts to answer questions, voice hypophonic and hard to interpret RRR, no peripheral edema Respiratory rate normal, effort diminished, no rales appreciated Abdomen soft, no guarding, no distension Inattentive, globally weak, unable to meaningfully engage in exam.      Data Reviewed: BMP shows Cr stable at 2.3 yesterday CBC shows stable Hgb and platelets    Family Communication: son at bedside    Disposition: Status is: Inpatient 84 yo M with dementia, cachexia, chronic thrombocytopenia presented with spontaneous intracranial hemorrhages.  Stable, no intervention needed, pending CIR        Author: Lonni SHAUNNA Dalton, MD 04/10/2024 4:10 PM  For on call review [email or web address].    "

## 2024-04-10 NOTE — Hospital Course (Signed)
 84 y.o. M with dementia, lives at home, HTN, DM, malnutrition, CKD IV baseline 1.9-2.1 who presented with SAH.

## 2024-04-11 ENCOUNTER — Inpatient Hospital Stay

## 2024-04-11 DIAGNOSIS — I615 Nontraumatic intracerebral hemorrhage, intraventricular: Secondary | ICD-10-CM | POA: Diagnosis not present

## 2024-04-11 LAB — COMPREHENSIVE METABOLIC PANEL WITH GFR
ALT: 35 U/L (ref 0–44)
AST: 45 U/L — ABNORMAL HIGH (ref 15–41)
Albumin: 3.4 g/dL — ABNORMAL LOW (ref 3.5–5.0)
Alkaline Phosphatase: 57 U/L (ref 38–126)
Anion gap: 8 (ref 5–15)
BUN: 36 mg/dL — ABNORMAL HIGH (ref 8–23)
CO2: 24 mmol/L (ref 22–32)
Calcium: 8.1 mg/dL — ABNORMAL LOW (ref 8.9–10.3)
Chloride: 102 mmol/L (ref 98–111)
Creatinine, Ser: 1.94 mg/dL — ABNORMAL HIGH (ref 0.61–1.24)
GFR, Estimated: 34 mL/min — ABNORMAL LOW
Glucose, Bld: 75 mg/dL (ref 70–99)
Potassium: 3.8 mmol/L (ref 3.5–5.1)
Sodium: 135 mmol/L (ref 135–145)
Total Bilirubin: 0.6 mg/dL (ref 0.0–1.2)
Total Protein: 7.1 g/dL (ref 6.5–8.1)

## 2024-04-11 LAB — CBC
HCT: 31.1 % — ABNORMAL LOW (ref 39.0–52.0)
Hemoglobin: 10.5 g/dL — ABNORMAL LOW (ref 13.0–17.0)
MCH: 30.4 pg (ref 26.0–34.0)
MCHC: 33.8 g/dL (ref 30.0–36.0)
MCV: 90.1 fL (ref 80.0–100.0)
Platelets: 98 K/uL — ABNORMAL LOW (ref 150–400)
RBC: 3.45 MIL/uL — ABNORMAL LOW (ref 4.22–5.81)
RDW: 15.4 % (ref 11.5–15.5)
WBC: 13.7 K/uL — ABNORMAL HIGH (ref 4.0–10.5)
nRBC: 0 % (ref 0.0–0.2)

## 2024-04-11 LAB — GLUCOSE, CAPILLARY
Glucose-Capillary: 128 mg/dL — ABNORMAL HIGH (ref 70–99)
Glucose-Capillary: 127 mg/dL — ABNORMAL HIGH (ref 70–99)
Glucose-Capillary: 221 mg/dL — ABNORMAL HIGH (ref 70–99)
Glucose-Capillary: 283 mg/dL — ABNORMAL HIGH (ref 70–99)

## 2024-04-11 MED ORDER — CEFADROXIL 500 MG PO CAPS
500.0000 mg | ORAL_CAPSULE | Freq: Every day | ORAL | Status: AC
  Administered 2024-04-11 – 2024-04-15 (×5): 500 mg via ORAL
  Filled 2024-04-11 (×5): qty 1

## 2024-04-11 NOTE — Plan of Care (Signed)
  Problem: Education: Goal: Knowledge of General Education information will improve Description: Including pain rating scale, medication(s)/side effects and non-pharmacologic comfort measures Outcome: Progressing   Problem: Health Behavior/Discharge Planning: Goal: Ability to manage health-related needs will improve Outcome: Progressing   Problem: Clinical Measurements: Goal: Ability to maintain clinical measurements within normal limits will improve Outcome: Progressing Goal: Will remain free from infection Outcome: Progressing Goal: Diagnostic test results will improve Outcome: Progressing Goal: Respiratory complications will improve Outcome: Progressing Goal: Cardiovascular complication will be avoided Outcome: Progressing   Problem: Activity: Goal: Risk for activity intolerance will decrease Outcome: Progressing   Problem: Nutrition: Goal: Adequate nutrition will be maintained Outcome: Progressing   Problem: Coping: Goal: Level of anxiety will decrease Outcome: Progressing   Problem: Elimination: Goal: Will not experience complications related to bowel motility Outcome: Progressing Goal: Will not experience complications related to urinary retention Outcome: Progressing   Problem: Pain Managment: Goal: General experience of comfort will improve and/or be controlled Outcome: Progressing   Problem: Safety: Goal: Ability to remain free from injury will improve Outcome: Progressing   Problem: Skin Integrity: Goal: Risk for impaired skin integrity will decrease Outcome: Progressing   Problem: Education: Goal: Knowledge of disease or condition will improve Outcome: Progressing Goal: Knowledge of secondary prevention will improve (MUST DOCUMENT ALL) Outcome: Progressing Goal: Knowledge of patient specific risk factors will improve (DELETE if not current risk factor) Outcome: Progressing   Problem: Intracerebral Hemorrhage Tissue Perfusion: Goal: Complications  of Intracerebral Hemorrhage will be minimized Outcome: Progressing   Problem: Coping: Goal: Will verbalize positive feelings about self Outcome: Progressing Goal: Will identify appropriate support needs Outcome: Progressing   Problem: Health Behavior/Discharge Planning: Goal: Ability to manage health-related needs will improve Outcome: Progressing Goal: Goals will be collaboratively established with patient/family Outcome: Progressing   Problem: Self-Care: Goal: Ability to participate in self-care as condition permits will improve Outcome: Progressing Goal: Verbalization of feelings and concerns over difficulty with self-care will improve Outcome: Progressing Goal: Ability to communicate needs accurately will improve Outcome: Progressing   Problem: Nutrition: Goal: Risk of aspiration will decrease Outcome: Progressing Goal: Dietary intake will improve Outcome: Progressing   Problem: Education: Goal: Ability to describe self-care measures that may prevent or decrease complications (Diabetes Survival Skills Education) will improve Outcome: Progressing Goal: Individualized Educational Video(s) Outcome: Progressing   Problem: Coping: Goal: Ability to adjust to condition or change in health will improve Outcome: Progressing   Problem: Fluid Volume: Goal: Ability to maintain a balanced intake and output will improve Outcome: Progressing   Problem: Health Behavior/Discharge Planning: Goal: Ability to identify and utilize available resources and services will improve Outcome: Progressing Goal: Ability to manage health-related needs will improve Outcome: Progressing   Problem: Metabolic: Goal: Ability to maintain appropriate glucose levels will improve Outcome: Progressing   Problem: Nutritional: Goal: Maintenance of adequate nutrition will improve Outcome: Progressing Goal: Progress toward achieving an optimal weight will improve Outcome: Progressing   Problem: Skin  Integrity: Goal: Risk for impaired skin integrity will decrease Outcome: Progressing   Problem: Tissue Perfusion: Goal: Adequacy of tissue perfusion will improve Outcome: Progressing

## 2024-04-11 NOTE — Plan of Care (Signed)
" °  Problem: Education: Goal: Knowledge of General Education information will improve Description: Including pain rating scale, medication(s)/side effects and non-pharmacologic comfort measures Outcome: Progressing   Problem: Clinical Measurements: Goal: Ability to maintain clinical measurements within normal limits will improve Outcome: Progressing Goal: Cardiovascular complication will be avoided Outcome: Progressing   Problem: Education: Goal: Knowledge of disease or condition will improve Outcome: Progressing   Problem: Intracerebral Hemorrhage Tissue Perfusion: Goal: Complications of Intracerebral Hemorrhage will be minimized Outcome: Progressing   Problem: Coping: Goal: Will verbalize positive feelings about self Outcome: Progressing   Problem: Health Behavior/Discharge Planning: Goal: Ability to manage health-related needs will improve Outcome: Progressing   Problem: Self-Care: Goal: Ability to participate in self-care as condition permits will improve Outcome: Progressing   Problem: Fluid Volume: Goal: Ability to maintain a balanced intake and output will improve Outcome: Progressing   Problem: Metabolic: Goal: Ability to maintain appropriate glucose levels will improve Outcome: Progressing   "

## 2024-04-11 NOTE — Care Management Important Message (Signed)
 Important Message  Patient Details  Name: Jacob Barnes MRN: [id number] Date of Birth: 1940/05/11   Important Message Given:  Yes - Medicare IM     Claretta Deed 04/11/2024, 3:30 PM

## 2024-04-11 NOTE — Progress Notes (Signed)
 " Progress Note   Patient: Jacob Barnes FMW:[id number] DOB: [birth year] DOA: 04/07/2024     4 DOS: the patient was seen and examined on 04/11/2024 at 10:10 AM      Brief hospital course: 84 y.o. M with dementia, lives at home, HTN, DM, malnutrition, CKD IV baseline 1.9-2.1 who presented with small intracranial hemorrhage.     Assessment and Plan: Nontraumatic small subarachnoid hemorrhage Small intraventricular hemorrhage Small subdural hematoma Patient had had fall several days before Christmas.  Came to ER on Christmas for flank pain, found to have kidney stone.  Over next few days, family noticed he seemed more off, weaker, brought him to the ER. There, CT head showed hemorrhages.  Neurology were consulted, admitted to ICU.  Repeat CT imaging showed no clinically significant change of hemorrhages.  EEG unremarkable, echocardiogram showed severe LVH, but no embolic source LDL 11, hemoglobin A1c 8.3% - PT/OT - Avoid aspirin  - Continue Lipitor, Keppra   Suspected aspiration pneumonia Fever 1/2.  Not able to articulate any symptoms.  Denies cough, chest pain, dysuria, suprapubic pain.  Given his poor prognosis, rather than get chest x-ray, I will treat aspiration empirically. - Cefadroxil twice daily day 1 of 5   Severe protein calorie malnutrition Recommended to resume mirtazapine, family asked nursing not to, unclear reason why. - Continue feeding supplements - Recommend palliative care to follow at SNF  Chronic kidney disease stage IV Creatinine stable relative to baseline, suspect GFR significantly lower than predicted baseline creatinine given his severe muscle wasting.  Severe LVH Small pericardial effusion This is an incidental finding in the hospital on echocardiogram.  Cardiology were consulted, they recommended outpatient follow-up - Follow-up with cardiologist outpatient, pending goals of care  Acute metabolic encephalopathy on dementia -Delirium precautions -  Continue donepezil   Cerebrovascular disease Hypertension Hyperlipidemia Blood pressure high normal - Continue amlodipine  - Continue Lipitor  History of seizures - Continue Keppra   Diabetes with hyperglycemia and chronic kidney disease Glucose mostly controlled in the last 24 hours - Continue sliding scale corrections - Hold home degludec/glargine, and Januvia   Anemia Thrombocytopenia Elevated serum free light chains Follows with Dr. Davonna at the [facility] cancer center.  He is not a candidate for chemotherapy, and so decision to biopsy his anemia, thrombocytopenia, and elevated free light chains has been deferred.  Platelets 61K on admission, transfused platelets, increased to 90K here.   Certainly may just be anemia of chronic kidney disease  Nephrolithiasis Kidney stone found week prior to admission, asymptomatic at present.  Microscopic hematuria on UA is from that, infection ruled out        Subjective: Patient denies pain or complaints.  He appears generally weak.  He has psychomotor slowing.  But when I ask if he is ready for rehab he says yes.  Son has no new concerns.  Nursing of no concerns.    Physical Exam: BP (!) 116/91   Pulse (P) 66   Temp 97.7 F (36.5 C) (Oral)   Resp 14   Ht 5' 4 (1.626 m)   Wt 39.2 kg   SpO2 100%   BMI 14.83 kg/m   Cachectic elderly male, lying in bed, sleeping but opens eyes to touch RRR, no murmurs, no peripheral edema Respiratory rate normal, effort diminished, no rales or wheezing Abdomen soft, no guarding or distention Globally weak, opens eyes and responds to questions, then closes his eyes and goes back to sleep, severe generalized weakness      Data Reviewed: Basic metabolic  panel and CBC unremarkable except for improving thrombocytosis, and new leukocytosis   Family Communication: son at bedside    Disposition: Status is: Inpatient 84 yo M with dementia, cachexia, chronic thrombocytopenia presented with  spontaneous intracranial hemorrhages.  Stable, no intervention needed, pending SNF  Discussed overall very poor prognosis with son        Author: Lonni SHAUNNA Dalton, MD 04/11/2024 7:37 PM  For on call review [email or web address]    "

## 2024-04-11 NOTE — Progress Notes (Signed)
 [facility] will sign off.   Medication Recommendations:  No in patient recommendations.  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  Other recommendations (labs, testing, etc):  None at present Follow up as an outpatient:  Appt has been made.

## 2024-04-11 NOTE — Progress Notes (Signed)
 Occupational Therapy Treatment Patient Details Name: Jacob Barnes MRN: [id number] DOB: Feb 26, 1941 Today's Date: 04/11/2024   History of present illness Jacob Barnes is a 84 y.o. male who is brought in to the ED with confusion and generalized weakness. CT head demonstrated IVH centered in the L choroid plexus with layering hemorrhage in BL occipital lobes. PMH: dementia, HTN, HLD, GERD, DM2, osteoporosis, anemia, thrombocytopenia, prior L SDH, retinopathy   OT comments  Pt seen for OT treat this AM, co-tx with PT to maximize performance/participation. Pt making slow progress, obtunded, requires sternal rub and multisensory noxious stim for brief eye opening (non-sustained). He was total A +2 to come to EOB, initially max A, progressing to CGA for seated balance. Heavy posterior lean in sitting/stance. Max A and hand-over-hand to initiate simple grooming EOB. Pt following 1-step commands ~15% of the time. Supportive son at bedside, with several questions re: rehab process, dispo planning, etc. Updating recommendations to post-acute rehab <3 hours/day given today's performance. OT to continue to follow.      If plan is discharge home, recommend the following:  Two people to help with walking and/or transfers;Two people to help with bathing/dressing/bathroom;Assistance with cooking/housework;Assist for transportation;Help with stairs or ramp for entrance;Direct supervision/assist for financial management;Direct supervision/assist for medications management;Assistance with feeding;Supervision due to cognitive status   Equipment Recommendations  Other (comment) (defer)    Recommendations for Other Services      Precautions / Restrictions Precautions Precautions: Fall Recall of Precautions/Restrictions: Impaired Precaution/Restrictions Comments: monitor for orthostatics Restrictions Weight Bearing Restrictions Per Provider Order: No       Mobility Bed Mobility Overal bed mobility: Needs  Assistance Bed Mobility: Supine to Sit, Sit to Supine     Supine to sit: Total assist, +2 for physical assistance Sit to supine: Total assist, +2 for physical assistance   General bed mobility comments: Exited to the L, pt not initiating any movement to come to EOB    Transfers Overall transfer level: Needs assistance Equipment used: 2 person hand held assist Transfers: Sit to/from Stand Sit to Stand: Mod assist, +2 physical assistance, Max assist           General transfer comment: stood from bed with heavy posterior lean, pt with poor initiation shifting weight forward; dependent for advancing LE's towards HOB to take steps, pt unable to follow and execute commands     Balance Overall balance assessment: Needs assistance Sitting-balance support: Feet supported, Bilateral upper extremity supported, Single extremity supported Sitting balance-Leahy Scale: Poor Sitting balance - Comments: initially max A, progressing to CGA with sustained mild posterior lean, worsens with perturbations.   Standing balance support: Bilateral upper extremity supported, During functional activity Standing balance-Leahy Scale: Zero Standing balance comment: dependent +2, reliant on external therapist support                           ADL either performed or assessed with clinical judgement   ADL Overall ADL's : Needs assistance/impaired     Grooming: Maximal assistance;Sitting;Wash/dry face Grooming Details (indicate cue type and reason): hand-over-hand to initiate task, initiates better with warmer water ; attempted using cold water  to wake up patient             Lower Body Dressing: Total assistance Lower Body Dressing Details (indicate cue type and reason): B sock adjustment                    Extremity/Trunk Assessment  Vision       Perception     Praxis     Communication Communication Communication: Impaired Factors Affecting  Communication: Hearing impaired (very [facility])   Cognition Arousal: Obtunded Behavior During Therapy: Flat affect Cognition: History of cognitive impairments             OT - Cognition Comments: known hx dementia, pt mostly with eyes closed during session today                 Following commands: Impaired Following commands impaired: Follows one step commands inconsistently      Cueing   Cueing Techniques: Verbal cues, Tactile cues  Exercises      Shoulder Instructions       General Comments supportive son present throughout    Pertinent Vitals/ Pain       Pain Assessment Pain Assessment: Faces Faces Pain Scale: No hurt  Home Living Family/patient expects to be discharged to:: Private residence Living Arrangements: Children                                      Prior Functioning/Environment              Frequency  Min 2X/week        Progress Toward Goals  OT Goals(current goals can now be found in the care plan section)  Progress towards OT goals: Progressing toward goals (slowly)     Plan      Co-evaluation    PT/OT/SLP Co-Evaluation/Treatment: Yes Reason for Co-Treatment: Necessary to address cognition/behavior during functional activity;For patient/therapist safety;To address functional/ADL transfers PT goals addressed during session: Mobility/safety with mobility;Balance        AM-PAC OT 6 Clicks Daily Activity     Outcome Measure   Help from another person eating meals?: Total Help from another person taking care of personal grooming?: A Lot Help from another person toileting, which includes using toliet, bedpan, or urinal?: Total Help from another person bathing (including washing, rinsing, drying)?: Total Help from another person to put on and taking off regular upper body clothing?: Total Help from another person to put on and taking off regular lower body clothing?: Total 6 Click Score: 7    End of Session  Equipment Utilized During Treatment: Gait belt  OT Visit Diagnosis: Other abnormalities of gait and mobility (R26.89);Muscle weakness (generalized) (M62.81);Other symptoms and signs involving the nervous system (R29.898)   Activity Tolerance Patient tolerated treatment well   Patient Left in bed;with call bell/phone within reach;with nursing/sitter in room   Nurse Communication Mobility status        Time: 8890-8865 OT Time Calculation (min): 25 min  Charges: OT General Charges $OT Visit: 1 Visit OT Treatments $Self Care/Home Management : 8-22 mins  Marjorie Lussier M. [place], OTR/L [facility] Acute Rehabilitation Services [phone] Secure Chat Preferred  Rayburn Mundis 04/11/2024, 1:24 PM

## 2024-04-11 NOTE — Progress Notes (Signed)
 IP rehab admissions - I met with patient and his son, [name], at the bedside.  [name] is interested in SNF placement for patient.  He does not feel that patient can tolerate 3 hrs of therapy a day.  SW is aware of need for SNF placement.  [phone]

## 2024-04-11 NOTE — TOC Progression Note (Signed)
 Transition of Care [facility]) - Progression Note    Patient Details  Name: ATTIKUS BARTOSZEK MRN: [id number] Date of Birth: Aug 14, 1940  Transition of Care [facility]) CM/SW Contact  Andrez JULIANNA George, RN Phone Number: 04/11/2024, 12:52 PM  Clinical Narrative:     CM met with the patient and his son. Son prefers SNF rehab in the [facility] area for his dad. CM has updated LCSW. He is in agreement with pt being faxed out.  IP Care management following.   Expected Discharge Plan: IP Rehab Facility Barriers to Discharge: Continued Medical Work up               Expected Discharge Plan and Services       Living arrangements for the past 2 months: Single Family Home                                       Social Drivers of Health (SDOH) Interventions SDOH Screenings   Food Insecurity: Food Insecurity Present (02/05/2024)  Housing: Low Risk (02/05/2024)  Transportation Needs: No Transportation Needs (02/05/2024)  Utilities: Not At Risk (02/05/2024)  Alcohol Screen: Low Risk (02/05/2024)  Depression (PHQ2-9): Low Risk (02/22/2024)  Financial Resource Strain: Patient Declined (02/05/2024)  Physical Activity: Inactive (02/05/2024)  Social Connections: Moderately Integrated (02/05/2024)  Stress: No Stress Concern Present (02/05/2024)  Tobacco Use: Low Risk (04/07/2024)  Health Literacy: Adequate Health Literacy (02/05/2024)    Readmission Risk Interventions     No data to display

## 2024-04-11 NOTE — Progress Notes (Signed)
 Physical Therapy Treatment Patient Details Name: Jacob Barnes MRN: [id number] DOB: Sep 03, 1940 Today's Date: 04/11/2024   History of Present Illness Jacob Barnes is a 84 y.o. male who is brought in to the ED with confusion and generalized weakness. CT head demonstrated IVH centered in the L choroid plexus with layering hemorrhage in BL occipital lobes. PMH: dementia, HTN, HLD, GERD, DM2, osteoporosis, anemia, thrombocytopenia, prior L SDH, retinopathy    PT Comments  Pt greeted supine in bed, son present and supportive throughout session. He kept eyes closed throughout the majority of treatment only briefly opening his eyes for <10 seconds at a time. Pt was obtunded and demonstrated no command following. He performed sit<>stand and took a couple of steps with mod-maxA x2. Pt demonstrated an excessive posterior lean with inability to correct despite multi-modal cues. He appeared to begin reflexively walking forward and required physical assist to move each LE in order to walk backwards to return to bed. Pt will benefit from continued inpatient follow up therapy, <3 hours/day.    If plan is discharge home, recommend the following: Assist for transportation;Help with stairs or ramp for entrance;Supervision due to cognitive status;Two people to help with walking and/or transfers;Two people to help with bathing/dressing/bathroom;Assistance with cooking/housework   Can travel by private vehicle     No  Equipment Recommendations  Hospital bed;Hoyer lift;Wheelchair (measurements PT);Wheelchair cushion (measurements PT)    Recommendations for Other Services       Precautions / Restrictions Precautions Precautions: Fall Recall of Precautions/Restrictions: Impaired Restrictions Weight Bearing Restrictions Per Provider Order: No     Mobility  Bed Mobility Overal bed mobility: Needs Assistance Bed Mobility: Supine to Sit, Sit to Supine     Supine to sit: +2 for physical assistance, Total  assist Sit to supine: +2 for physical assistance, Total assist   General bed mobility comments: PT/OT sat pt up on L side of bed. He did not actively assist. PT managed trunk and OT managed BLEs. Repositioned using bed features, bed pad, and +2 assist.    Transfers Overall transfer level: Needs assistance Equipment used: 2 person hand held assist Transfers: Sit to/from Stand Sit to Stand: Mod assist, +2 physical assistance, Max assist           General transfer comment: Pt stood from lowest bed height. PT/OT on either side of him providing support under axilla. Instructed pt to hold onto posterior upper arm with minimal grasp. Pt powered up with modA x2 on the first stand. He demonstrated retropulsion despite VC/TC for upright posture, hip ext, and pelvis tucked. Pt maintained a posterior lean. On second stand pt required maxA x2 to power up. He demonstrated a WBOS, splayed legs with one ER and the other IR, as well as B knee flex. Anterior knee block provided bilaterally. Guided pt back to bed to sit.    Ambulation/Gait Ambulation/Gait assistance: Max assist, +2 physical assistance Gait Distance (Feet): 2 Feet (x2) Assistive device: 2 person hand held assist Gait Pattern/deviations: Step-to pattern, Leaning posteriorly, Ataxic, Wide base of support, Knee flexed in stance - right, Knee flexed in stance - left Gait velocity: decr     General Gait Details: Pt appeared to begin reflexively walking once standing up, taking small steps forwards with increased hip/knee flex to advance. He maintained a significant posterior lean. Mutli-modal cues to correct sequencing, technique, posture, and improve safety awareness. Pt did not respond to commands. Physical assist to advance each LE one at a time backwards toward  bed. After a seated rest break attempted to have pt take side steps to the left to get higher up in bed. Pt demonstraated B knee flex. PT/OT facilitated sideways movement and guided pt  back to bed.   Stairs             Wheelchair Mobility     Tilt Bed    Modified Rankin (Stroke Patients Only) Modified Rankin (Stroke Patients Only) Pre-Morbid Rankin Score: Moderate disability Modified Rankin: Severe disability     Balance Overall balance assessment: Needs assistance Sitting-balance support: Feet supported, Bilateral upper extremity supported, Single extremity supported Sitting balance-Leahy Scale: Poor Sitting balance - Comments: Pt sat EOB. He initially required maxA to maintain static seated balance displaying a left lateral and posterior lean. Pt able to transition slowly to CGA, still displaying a posterior lean. Pt engaged in washing his face with assist from OT using RUE to hold wash cloth with help. He demonstrated a increased posterior lean with activity. Postural control: Posterior lean, Left lateral lean Standing balance support: Bilateral upper extremity supported, During functional activity Standing balance-Leahy Scale: Zero Standing balance comment: Pt dependent on modA x2 statically and maxA x2 dynamically. He goes into retropulsion with inability to correct despite multi-modal cues.                            Communication Communication Communication: Impaired Factors Affecting Communication: Hearing impaired  Cognition Arousal: Obtunded Behavior During Therapy: Flat affect   PT - Cognitive impairments: History of cognitive impairments (Dementia)                       PT - Cognition Comments: Pt kept eyes closed throughout the majority of the session. He did not open his eyes to verbal command. Pt required continuous tactile stimulation to engage. Slightly improved engagement once seated up. No command following. He had a tendency to respond slightly more to his son. When pt would open eyes it would be briefly for <10 seconds. Following commands: Impaired Following commands impaired: Follows one step commands  inconsistently, Follows one step commands with increased time    Cueing Cueing Techniques: Verbal cues, Tactile cues  Exercises      General Comments General comments (skin integrity, edema, etc.): Son present and supportive throughout session. He reported having questions for Case Management, messaged them at beginning of session with a representative entering at the end of the session.      Pertinent Vitals/Pain Pain Assessment Pain Assessment: Faces Faces Pain Scale: No hurt Pain Intervention(s): Monitored during session, Repositioned    Home Living Family/patient expects to be discharged to:: Private residence Living Arrangements: Children                      Prior Function            PT Goals (current goals can now be found in the care plan section) Acute Rehab PT Goals Patient Stated Goal: Didn't state PT Goal Formulation: With patient Time For Goal Achievement: 04/22/24 Potential to Achieve Goals: Fair Progress towards PT goals: Progressing toward goals    Frequency    Min 2X/week      PT Plan      Co-evaluation PT/OT/SLP Co-Evaluation/Treatment: Yes Reason for Co-Treatment: Necessary to address cognition/behavior during functional activity;For patient/therapist safety;To address functional/ADL transfers PT goals addressed during session: Mobility/safety with mobility;Balance        AM-PAC  PT 6 Clicks Mobility   Outcome Measure  Help needed turning from your back to your side while in a flat bed without using bedrails?: Total Help needed moving from lying on your back to sitting on the side of a flat bed without using bedrails?: Total Help needed moving to and from a bed to a chair (including a wheelchair)?: Total Help needed standing up from a chair using your arms (e.g., wheelchair or bedside chair)?: Total Help needed to walk in hospital room?: Total Help needed climbing 3-5 steps with a railing? : Total 6 Click Score: 6    End of  Session Equipment Utilized During Treatment: Gait belt Activity Tolerance: Patient limited by fatigue Patient left: in bed;with call bell/phone within reach;with bed alarm set;with family/visitor present Nurse Communication: Mobility status;Need for lift equipment PT Visit Diagnosis: Unsteadiness on feet (R26.81);Muscle weakness (generalized) (M62.81);Difficulty in walking, not elsewhere classified (R26.2);History of falling (Z91.81)     Time: 8890-8865 PT Time Calculation (min) (ACUTE ONLY): 25 min  Charges:    $Therapeutic Activity: 8-22 mins PT General Charges $$ ACUTE PT VISIT: 1 Visit                     Randall SAUNDERS, PT, DPT Acute Rehabilitation Services Office: [phone] Secure Chat Preferred  Delon CHRISTELLA Callander 04/11/2024, 12:37 PM

## 2024-04-11 NOTE — TOC PASRR Note (Signed)
 [facility] IP TOC PASRR NOTE  30 Day PASRR Note   Patient Details  Name: Jacob Barnes Date of Birth: [birth year]   Transition of Care [facility]) CM/SW Contact:    [name], LCSW Phone Number: 04/11/2024, 11:49 AM  To Whom It May Concern:  Please be advised that this patient will require a short-term nursing home stay - anticipated 30 days or less for rehabilitation and strengthening.   The plan is for return home.   TOC Dementia Note   Patient Details  Name: Jacob Barnes Date of Birth: 16-May-1940 04/11/2024, 11:49 AM   To Whom It May Concern:  Please be advised that the above-named patient has a primary diagnosis of dementia which supersedes any psychiatric diagnosis.   Transition of Care [facility]) CM/SW Contact: [name], LCSW Phone Number: 04/11/2024, 11:49 AM

## 2024-04-11 NOTE — [place] FL2 (Signed)
 " [place]  MEDICAID FL2 LEVEL OF CARE FORM     IDENTIFICATION  Patient Name: Jacob Barnes Birthdate: 16-Sep-1940 Sex: male Admission Date (Current Location): 04/07/2024  [facility] and [place] Number:   ([place] )   Facility and Address:  [facility], [address], [place], [name] [address]      Provider Number: [id number]  Attending Physician Name and Address:  Jonel Lonni SQUIBB, *  Relative Name and Phone Number:       Current Level of Care: Hospital Recommended Level of Care: Skilled Nursing Facility Prior Approval Number:    Date Approved/Denied:   PASRR Number: Manual review  Discharge Plan: SNF    Current Diagnoses: Patient Active Problem List   Diagnosis Date Noted   Protein-calorie malnutrition, severe 04/09/2024   Pressure injury of skin 04/08/2024   Intraventricular hemorrhage (HCC) 04/07/2024   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   Normocytic anemia 09/07/2023   Thrombocytopenia 09/07/2023   Elevated serum immunoglobulin free light chains 09/07/2023   Dry eyes 06/16/2023   Kidney stones 06/16/2023   Diabetes mellitus with complication, with long-term current use of insulin  (HCC) 04/05/2023   Malnutrition of moderate degree 02/11/2023   Opacity of lung on imaging study 02/11/2023   Hearing loss of left ear due to cerumen impaction 04/24/2022   Sudden idiopathic hearing loss, unspecified ear 04/24/2022   Subdural hematoma (HCC) 03/19/2022   Allergy to alpha-gal 03/13/2022   Loss of weight 12/04/2021   Hyperlipidemia LDL goal <100 10/24/2021   Traumatic retroperitoneal hematoma 05/03/2021   Dementia (HCC) 01/24/2021   Chronic diarrhea 06/18/2020   At high risk for falls 04/09/2020   Uncontrolled type 2 diabetes mellitus with hyperglycemia (HCC) 01/28/2020   Osteopenia 01/28/2020   Compression fracture of L1 lumbar vertebra (HCC) 01/09/2020   After-cataract with vision obscured 02/10/2019   Diabetic macular edema (HCC) 02/10/2019    Dry eye syndrome of bilateral lacrimal glands 02/10/2019   Lens replaced by other means 02/10/2019   Open-angle glaucoma 02/10/2019   Presence of intraocular lens 02/10/2019   Unspecified visual loss 02/10/2019   Type 2 diabetes mellitus with proliferative retinopathy of both eyes and macular edema (HCC) 02/10/2019   Type 2 diabetes mellitus with vascular disease (HCC) 10/20/2018   Bilateral pseudophakia 02/08/2016   Proliferative retinopathy with retinal edema due to type 2 diabetes mellitus (HCC) 02/08/2016   Primary open angle glaucoma of both eyes 02/08/2016   Iron  deficiency anemia 01/10/2014   CKD (chronic kidney disease) stage 3, GFR 30-59 ml/min (HCC) 12/15/2013   AKI (acute kidney injury) 12/11/2013   Anemia 12/11/2013   Mild protein-calorie malnutrition 12/11/2013   GERD (gastroesophageal reflux disease) 06/27/2012   Background diabetic retinopathy (HCC) 03/28/2012   Retinal edema 03/28/2012   Tear film insufficiency 08/07/2011   Borderline glaucoma, open angle with borderline findings 03/22/2011   Essential hypertension, benign 01/02/2009    Orientation RESPIRATION BLADDER Height & Weight     Self  Normal Incontinent Weight: 86 lb 6.7 oz (39.2 kg) Height:  5' 4 (162.6 cm)  BEHAVIORAL SYMPTOMS/MOOD NEUROLOGICAL BOWEL NUTRITION STATUS      Incontinent Diet (regular)  AMBULATORY STATUS COMMUNICATION OF NEEDS Skin   Extensive Assist Verbally PU Stage and Appropriate Care, Other (Comment) (traumatic skin tear, left thigh: foam dressing, lift every shift to assess and change PRN) PU Stage 1 Dressing:  (mid sacrum: foam dressing, lift every shift to assess and change PRN)  Personal Care Assistance Level of Assistance  Bathing, Feeding, Dressing Bathing Assistance: Maximum assistance Feeding assistance: Maximum assistance Dressing Assistance: Maximum assistance     Functional Limitations Info  Sight, Hearing Sight Info: Impaired Hearing Info:  Impaired      SPECIAL CARE FACTORS FREQUENCY  PT (By licensed PT), OT (By licensed OT)     PT Frequency: 5x/wk OT Frequency: 5x/wk            Contractures Contractures Info: Not present    Additional Factors Info  Code Status, Allergies, Psychotropic, Insulin  Sliding Scale Code Status Info: Full Allergies Info: Alpha-gal Psychotropic Info: Aricept  10mg  daily at bed Insulin  Sliding Scale Info: see DC summary       Current Medications (04/11/2024):  This is the current hospital active medication list Current Facility-Administered Medications  Medication Dose Route Frequency Provider Last Rate Last Admin   acetaminophen  (TYLENOL ) tablet 650 mg  650 mg Oral Q4H PRN Khaliqdina, Salman, MD   650 mg at 04/11/24 0421   Or   acetaminophen  (TYLENOL ) 160 MG/5ML solution 650 mg  650 mg Per Tube Q4H PRN Khaliqdina, Salman, MD       Or   acetaminophen  (TYLENOL ) suppository 650 mg  650 mg Rectal Q4H PRN Khaliqdina, Salman, MD       amLODipine  (NORVASC ) tablet 2.5 mg  2.5 mg Oral Daily Sethi, Pramod S, MD   2.5 mg at 04/11/24 1001   atorvastatin  (LIPITOR) tablet 10 mg  10 mg Oral Daily Khaliqdina, Salman, MD   10 mg at 04/11/24 1001   Chlorhexidine  Gluconate Cloth 2 % PADS 6 each  6 each Topical Daily Michaela Aisha SQUIBB, MD   6 each at 04/11/24 1002   donepezil  (ARICEPT ) tablet 10 mg  10 mg Oral QHS Khaliqdina, Salman, MD   10 mg at 04/10/24 2205   dorzolamide -timolol  (COSOPT ) 2-0.5 % ophthalmic solution 1 drop  1 drop Both Eyes BID Danford, Christopher P, MD   1 drop at 04/11/24 1001   feeding supplement (ENSURE PLUS HIGH PROTEIN) liquid 237 mL  237 mL Oral BID BM Sethi, Pramod S, MD   237 mL at 04/11/24 0820   insulin  aspart (novoLOG ) injection 0-15 Units  0-15 Units Subcutaneous TID WC Sethi, Pramod S, MD   2 Units at 04/11/24 9355   insulin  aspart (novoLOG ) injection 0-5 Units  0-5 Units Subcutaneous QHS Khaliqdina, Salman, MD   2 Units at 04/10/24 2205   labetalol (NORMODYNE)  injection 10 mg  10 mg Intravenous Q4H PRN Lehner, Erin C, NP       latanoprost  (XALATAN ) 0.005 % ophthalmic solution 1 drop  1 drop Both Eyes QHS Khaliqdina, Salman, MD   1 drop at 04/10/24 2210   levETIRAcetam  (KEPPRA ) tablet 500 mg  500 mg Oral BID Khaliqdina, Salman, MD   500 mg at 04/11/24 1001   multivitamin with minerals tablet 1 tablet  1 tablet Oral Daily Khaliqdina, Salman, MD   1 tablet at 04/11/24 1001   mupirocin ointment (BACTROBAN) 2 %   Nasal BID Sethi, Pramod S, MD   Given at 04/11/24 1002   Oral care mouth rinse  15 mL Mouth Rinse 4 times per day Khaliqdina, Salman, MD   15 mL at 04/11/24 9178   Oral care mouth rinse  15 mL Mouth Rinse PRN Khaliqdina, Salman, MD       tamsulosin  (FLOMAX ) capsule 0.4 mg  0.4 mg Oral QPC supper Lehner, Erin C, NP   0.4 mg at 04/10/24 1732  Discharge Medications: Please see discharge summary for a list of discharge medications.  Relevant Imaging Results:  Relevant Lab Results:   Additional Information SS#: [id number]  [name], LCSW     "

## 2024-04-12 DIAGNOSIS — I615 Nontraumatic intracerebral hemorrhage, intraventricular: Secondary | ICD-10-CM | POA: Diagnosis not present

## 2024-04-12 LAB — BASIC METABOLIC PANEL WITH GFR
Anion gap: 10 (ref 5–15)
BUN: 37 mg/dL — ABNORMAL HIGH (ref 8–23)
CO2: 22 mmol/L (ref 22–32)
Calcium: 8.1 mg/dL — ABNORMAL LOW (ref 8.9–10.3)
Chloride: 100 mmol/L (ref 98–111)
Creatinine, Ser: 1.84 mg/dL — ABNORMAL HIGH (ref 0.61–1.24)
GFR, Estimated: 36 mL/min — ABNORMAL LOW
Glucose, Bld: 279 mg/dL — ABNORMAL HIGH (ref 70–99)
Potassium: 4.7 mmol/L (ref 3.5–5.1)
Sodium: 133 mmol/L — ABNORMAL LOW (ref 135–145)

## 2024-04-12 LAB — GLUCOSE, CAPILLARY
Glucose-Capillary: 273 mg/dL — ABNORMAL HIGH (ref 70–99)
Glucose-Capillary: 285 mg/dL — ABNORMAL HIGH (ref 70–99)
Glucose-Capillary: 198 mg/dL — ABNORMAL HIGH (ref 70–99)
Glucose-Capillary: 345 mg/dL — ABNORMAL HIGH (ref 70–99)

## 2024-04-12 LAB — CBC
HCT: 31.7 % — ABNORMAL LOW (ref 39.0–52.0)
Hemoglobin: 10.7 g/dL — ABNORMAL LOW (ref 13.0–17.0)
MCH: 30.4 pg (ref 26.0–34.0)
MCHC: 33.8 g/dL (ref 30.0–36.0)
MCV: 90.1 fL (ref 80.0–100.0)
Platelets: 88 K/uL — ABNORMAL LOW (ref 150–400)
RBC: 3.52 MIL/uL — ABNORMAL LOW (ref 4.22–5.81)
RDW: 15.4 % (ref 11.5–15.5)
WBC: 8 K/uL (ref 4.0–10.5)
nRBC: 0 % (ref 0.0–0.2)

## 2024-04-12 NOTE — Plan of Care (Signed)
   Problem: Health Behavior/Discharge Planning: Goal: Ability to manage health-related needs will improve Outcome: Progressing   Problem: Clinical Measurements: Goal: Ability to maintain clinical measurements within normal limits will improve Outcome: Progressing   Problem: Clinical Measurements: Goal: Will remain free from infection Outcome: Progressing

## 2024-04-12 NOTE — Plan of Care (Signed)
  Problem: Education: Goal: Knowledge of General Education information will improve Description: Including pain rating scale, medication(s)/side effects and non-pharmacologic comfort measures Outcome: Progressing   Problem: Health Behavior/Discharge Planning: Goal: Ability to manage health-related needs will improve Outcome: Progressing   Problem: Clinical Measurements: Goal: Ability to maintain clinical measurements within normal limits will improve Outcome: Progressing Goal: Will remain free from infection Outcome: Progressing Goal: Diagnostic test results will improve Outcome: Progressing Goal: Respiratory complications will improve Outcome: Progressing Goal: Cardiovascular complication will be avoided Outcome: Progressing   Problem: Elimination: Goal: Will not experience complications related to bowel motility Outcome: Progressing Goal: Will not experience complications related to urinary retention Outcome: Progressing   Problem: Pain Managment: Goal: General experience of comfort will improve and/or be controlled Outcome: Progressing   Problem: Safety: Goal: Ability to remain free from injury will improve Outcome: Progressing   Problem: Skin Integrity: Goal: Risk for impaired skin integrity will decrease Outcome: Progressing

## 2024-04-12 NOTE — TOC Progression Note (Signed)
 Transition of Care [facility]) - Progression Note    Patient Details  Name: Jacob Barnes MRN: [id number] Date of Birth: 1941/03/15  [facility] of [facility] CM/SW Contact  Gwenn Frieze [place], [name] Phone Number: 04/12/2024, 12:11 PM  Clinical Narrative:  Spoke to pt's dtr [name] [phone] who reports she is the main contact as pt lives with her. Provided current SNF offer from [facility]. Pt's dtr is willing to accept [facility] if [facility] unable to offer. Referral to [facility] remains under review at this time. Will provide updates as available.   Frieze Gwenn, MSW, LCSW [phone] (coverage)      Expected Discharge Plan: IP Rehab Facility Barriers to Discharge: Continued Medical Work up               Expected Discharge Plan and Services       Living arrangements for the past 2 months: Single Family Home                                       Social Drivers of Health (SDOH) Interventions SDOH Screenings   Food Insecurity: Food Insecurity Present (02/05/2024)  Housing: Low Risk (02/05/2024)  Transportation Needs: No Transportation Needs (02/05/2024)  Utilities: Not At Risk (02/05/2024)  Alcohol Screen: Low Risk (02/05/2024)  Depression (PHQ2-9): Low Risk (02/22/2024)  Financial Resource Strain: Patient Declined (02/05/2024)  Physical Activity: Inactive (02/05/2024)  Social Connections: Moderately Integrated (02/05/2024)  Stress: No Stress Concern Present (02/05/2024)  Tobacco Use: Low Risk (04/07/2024)  Health Literacy: Adequate Health Literacy (02/05/2024)    Readmission Risk Interventions     No data to display

## 2024-04-12 NOTE — Plan of Care (Signed)
 ?  Problem: Clinical Measurements: ?Goal: Will remain free from infection ?Outcome: Progressing ?  ?

## 2024-04-12 NOTE — Progress Notes (Addendum)
 " PROGRESS NOTE    Jacob Barnes  FMW:[id number] DOB: [birth year] DOA: 04/07/2024 PCP: [name], MD   Brief Narrative:   84 y.o. M with dementia, lives at home, HTN, DM, malnutrition, CKD IV baseline 1.9-2.1 who presented with small intracranial hemorrhage.   Assessment & Plan:   Principal Problem:   Intraventricular hemorrhage (HCC) Active Problems:   Pressure injury of skin   Protein-calorie malnutrition, severe  Nontraumatic small subarachnoid hemorrhage Small intraventricular hemorrhage Small subdural hematoma Patient had had fall several days before Christmas.  Came to ER on Christmas for flank pain, found to have kidney stone.  Over next few days, family noticed he seemed more off, weaker, brought him to the ER. There, CT head showed hemorrhages.   Neurology were consulted, admitted to ICU.  Repeat CT imaging showed no clinically significant change of hemorrhages.   EEG unremarkable, echocardiogram showed severe LVH, but no embolic source LDL 11, hemoglobin A1c 8.3% - PT/OT - Avoid aspirin  - Continue Lipitor, Keppra    Suspected aspiration pneumonia Fever 1/2.  Not able to articulate any symptoms.  Denies cough, chest pain, dysuria, suprapubic pain.  Given his poor prognosis, rather than get chest x-ray, I will treat aspiration empirically. - Cefadroxil  twice daily start 04/11/24 for 5 days     Severe protein calorie malnutrition Recommended to resume mirtazapine, family asked nursing not to, unclear reason why. - Continue feeding supplements - Recommend palliative care to follow at SNF   Chronic kidney disease stage IV Creatinine stable relative to baseline, suspect GFR significantly lower than predicted baseline creatinine given his severe muscle wasting.   Severe LVH Small pericardial effusion This is an incidental finding in the hospital on echocardiogram.  Cardiology were consulted, they recommended outpatient follow-up - Follow-up with cardiologist  outpatient, pending goals of care   Acute metabolic encephalopathy on dementia -Delirium precautions - Continue donepezil    Cerebrovascular disease Hypertension Hyperlipidemia Blood pressure high normal - Continue amlodipine  - Continue Lipitor   History of seizures - Continue Keppra    Diabetes with hyperglycemia and chronic kidney disease Glucose mostly controlled in the last 24 hours - Continue sliding scale corrections - Hold home degludec/glargine, and Januvia    Anemia Thrombocytopenia Elevated serum free light chains Follows with Dr. Davonna at the [facility] cancer center.  He is not a candidate for chemotherapy, and so decision to biopsy his anemia, thrombocytopenia, and elevated free light chains has been deferred.  Platelets 61K on admission, transfused platelets, increased to 90K here.   Certainly may just be anemia of chronic kidney disease   Nephrolithiasis Kidney stone found week prior to admission, asymptomatic at present.  Microscopic hematuria on UA is from that, infection ruled out          DVT prophylaxis: scds Code Status:  full Family Communication: none at the bedside, called daughter  Disposition Plan: snf  Subjective:  Patient sleeping, arousable, diminished hearing.  Follows commands.  Vital signs are stable.  Objective: Vitals:   04/11/24 1507 04/11/24 1607 04/11/24 1915 04/12/24 0846  BP: (!) (P) 116/91 (!) 116/91 126/72 105/64  Pulse: (P) 66  71   Resp:  14 16 19   Temp: (P) 98.1 F (36.7 C) 97.7 F (36.5 C) 97.9 F (36.6 C) 97.8 F (36.6 C)  TempSrc: (P) Axillary Oral Oral Oral  SpO2: (P) 100% 100% 100% 100%  Weight:      Height:        Intake/Output Summary (Last 24 hours) at 04/12/2024 1053 Last  data filed at 04/11/2024 1449 Gross per 24 hour  Intake --  Output 200 ml  Net -200 ml   Filed Weights   04/07/24 0949 04/07/24 2020  Weight: 42.3 kg 39.2 kg    Examination:  General: Chronically ill, cachectic, not in any acute  distress Chest: Clear bilaterally, no wheezing CVS: S1, S2, no murmur, regular rhythm Abdomen: Soft, nontender Extremities: No edema   Data Reviewed: I have personally reviewed following labs and imaging studies  CBC: Recent Labs  Lab 04/07/24 1152 04/09/24 0334 04/10/24 0207 04/11/24 0304 04/12/24 0450  WBC 4.4 13.8* 9.2 13.7* 8.0  HGB 13.6 10.9* 10.7* 10.5* 10.7*  HCT 44.0 32.6* 32.0* 31.1* 31.7*  MCV 96.3 91.8 90.4 90.1 90.1  PLT 64* 95* 90* 98* 88*   Basic Metabolic Panel: Recent Labs  Lab 04/07/24 1047 04/09/24 0334 04/11/24 0304 04/12/24 0450  NA 142 137 135 133*  K 4.1 4.0 3.8 4.7  CL 110 106 102 100  CO2 17* 19* 24 22  GLUCOSE 87 127* 75 279*  BUN 31* 53* 36* 37*  CREATININE 1.81* 2.35* 1.94* 1.84*  CALCIUM  8.9 8.3* 8.1* 8.1*   GFR: Estimated Creatinine Clearance: 16.6 mL/min (A) (by C-G formula based on SCr of 1.84 mg/dL (H)). Liver Function Tests: Recent Labs  Lab 04/07/24 1047 04/11/24 0304  AST 34 45*  ALT 28 35  ALKPHOS 55 57  BILITOT 0.7 0.6  PROT 8.7* 7.1  ALBUMIN 4.2 3.4*   No results for input(s): LIPASE, AMYLASE in the last 168 hours. Recent Labs  Lab 04/07/24 1527  AMMONIA 19   Coagulation Profile: Recent Labs  Lab 04/07/24 1639  INR 1.3*   Cardiac Enzymes: No results for input(s): CKTOTAL, CKMB, CKMBINDEX, TROPONINI in the last 168 hours. BNP (last 3 results) No results for input(s): PROBNP in the last 8760 hours. HbA1C: No results for input(s): HGBA1C in the last 72 hours. CBG: Recent Labs  Lab 04/11/24 0631 04/11/24 1109 04/11/24 1625 04/11/24 2128 04/12/24 0623  GLUCAP 128* 127* 221* 283* 273*   Lipid Profile: No results for input(s): CHOL, HDL, LDLCALC, TRIG, CHOLHDL, LDLDIRECT in the last 72 hours. Thyroid Function Tests: No results for input(s): TSH, T4TOTAL, FREET4, T3FREE, THYROIDAB in the last 72 hours. Anemia Panel: No results for input(s): VITAMINB12, FOLATE,  FERRITIN, TIBC, IRON , RETICCTPCT in the last 72 hours. Sepsis Labs: No results for input(s): PROCALCITON, LATICACIDVEN in the last 168 hours.  Recent Results (from the past 240 hours)  MRSA Next Gen by PCR, Nasal     Status: Abnormal   Collection Time: 04/07/24  8:25 PM   Specimen: Nasal Mucosa; Nasal Swab  Result Value Ref Range Status   MRSA by PCR Next Gen DETECTED (A) NOT DETECTED Final    Comment: CRITICAL RESULTS CALLED TO, READ BACK BY AND VERIFIED WITH: RN J.TIRRELL ON 04/08/24 AT 0052 BY NM (NOTE) The GeneXpert MRSA Assay (FDA approved for NASAL specimens only), is one component of a comprehensive MRSA colonization surveillance program. It is not intended to diagnose MRSA infection nor to guide or monitor treatment for MRSA infections. Test performance is not FDA approved in patients less than 14 years old. Performed at [facility], [address] [place], [name] [address]          Radiology Studies: No results found.      Scheduled Meds:  amLODipine   2.5 mg Oral Daily   atorvastatin   10 mg Oral Daily   cefadroxil   500 mg  Oral Daily   Chlorhexidine  Gluconate Cloth  6 each Topical Daily   donepezil   10 mg Oral QHS   dorzolamide -timolol   1 drop Both Eyes BID   feeding supplement  237 mL Oral BID BM   insulin  aspart  0-15 Units Subcutaneous TID WC   insulin  aspart  0-5 Units Subcutaneous QHS   latanoprost   1 drop Both Eyes QHS   levETIRAcetam   500 mg Oral BID   multivitamin with minerals  1 tablet Oral Daily   mupirocin  ointment   Nasal BID   mouth rinse  15 mL Mouth Rinse 4 times per day   tamsulosin   0.4 mg Oral QPC supper   Continuous Infusions:        Abir Craine, MD [facility] 04/12/2024, 10:53 AM   "

## 2024-04-13 ENCOUNTER — Inpatient Hospital Stay (HOSPITAL_COMMUNITY)

## 2024-04-13 DIAGNOSIS — I615 Nontraumatic intracerebral hemorrhage, intraventricular: Secondary | ICD-10-CM | POA: Diagnosis not present

## 2024-04-13 LAB — BASIC METABOLIC PANEL WITH GFR
Anion gap: 9 (ref 5–15)
BUN: 39 mg/dL — ABNORMAL HIGH (ref 8–23)
CO2: 20 mmol/L — ABNORMAL LOW (ref 22–32)
Calcium: 8.2 mg/dL — ABNORMAL LOW (ref 8.9–10.3)
Chloride: 101 mmol/L (ref 98–111)
Creatinine, Ser: 1.97 mg/dL — ABNORMAL HIGH (ref 0.61–1.24)
GFR, Estimated: 33 mL/min — ABNORMAL LOW
Glucose, Bld: 182 mg/dL — ABNORMAL HIGH (ref 70–99)
Potassium: 4.9 mmol/L (ref 3.5–5.1)
Sodium: 131 mmol/L — ABNORMAL LOW (ref 135–145)

## 2024-04-13 LAB — GLUCOSE, CAPILLARY
Glucose-Capillary: 190 mg/dL — ABNORMAL HIGH (ref 70–99)
Glucose-Capillary: 170 mg/dL — ABNORMAL HIGH (ref 70–99)
Glucose-Capillary: 291 mg/dL — ABNORMAL HIGH (ref 70–99)
Glucose-Capillary: 186 mg/dL — ABNORMAL HIGH (ref 70–99)

## 2024-04-13 MED ORDER — SODIUM CHLORIDE 0.9 % IV SOLN: INTRAVENOUS | Status: AC

## 2024-04-13 MED ORDER — LEVETIRACETAM 250 MG PO TABS
250.0000 mg | ORAL_TABLET | Freq: Two times a day (BID) | ORAL | Status: DC
  Administered 2024-04-13 – 2024-04-14 (×2): 250 mg via ORAL
  Filled 2024-04-13 (×2): qty 1

## 2024-04-13 NOTE — Plan of Care (Signed)
 Family at bedside assisting with lunch. Fluids well tolerated. IVF patent. CT scheduled today. Code status changed per MD.    Problem: Education: Goal: Knowledge of General Education information will improve Description: Including pain rating scale, medication(s)/side effects and non-pharmacologic comfort measures Outcome: Progressing   Problem: Activity: Goal: Risk for activity intolerance will decrease Outcome: Progressing   Problem: Nutrition: Goal: Adequate nutrition will be maintained Outcome: Progressing   Problem: Safety: Goal: Ability to remain free from injury will improve Outcome: Progressing   Problem: Skin Integrity: Goal: Risk for impaired skin integrity will decrease Outcome: Progressing

## 2024-04-13 NOTE — Progress Notes (Addendum)
 " PROGRESS NOTE    Jacob Barnes  FMW:[id number] DOB: 1940/07/14 DOA: 04/07/2024 PCP: [name], MD   Brief Narrative:   84 y.o. M with dementia, lives at home, HTN, DM, malnutrition, CKD IV baseline 1.9-2.1 who presented with small intracranial hemorrhage.   Assessment & Plan:   Principal Problem:   Intraventricular hemorrhage (HCC) Active Problems:   Pressure injury of skin   Protein-calorie malnutrition, severe  Nontraumatic small subarachnoid hemorrhage Small intraventricular hemorrhage Small subdural hematoma Patient had had fall several days before Christmas.  Came to ER on Christmas for flank pain, found to have kidney stone.  Over next few days, family noticed he seemed more off, weaker, brought him to the ER. There, CT head showed hemorrhages.   Neurology were consulted, admitted to ICU.  Repeat CT imaging showed no clinically significant change of hemorrhages.   EEG unremarkable, echocardiogram showed severe LVH, but no embolic source LDL 11, hemoglobin A1c 8.3% - PT/OT - Avoid aspirin  - Continue Lipitor, Keppra    Suspected aspiration pneumonia Fever 1/2.  Not able to articulate any symptoms.  Denies cough, chest pain, dysuria, suprapubic pain.  Given his poor prognosis, rather than get chest x-ray, I will treat aspiration empirically. - Cefadroxil  twice daily start 04/11/24 for 5 days     Severe protein calorie malnutrition Recommended to resume mirtazapine, family asked nursing not to, unclear reason why. - Continue feeding supplements - Recommend palliative care to follow at SNF   Chronic kidney disease stage IV Creatinine stable relative to baseline, suspect GFR significantly lower than predicted baseline creatinine given his severe muscle wasting.   Severe LVH Small pericardial effusion This is an incidental finding in the hospital on echocardiogram.  Cardiology were consulted, they recommended outpatient follow-up - Follow-up with cardiologist  outpatient, pending goals of care   Acute metabolic encephalopathy on dementia -Delirium precautions - Continue donepezil    Cerebrovascular disease Hypertension Hyperlipidemia Blood pressure high normal - Continue amlodipine  - Continue Lipitor   History of seizures - Continue Keppra    Diabetes with hyperglycemia and chronic kidney disease Glucose mostly controlled in the last 24 hours - Continue sliding scale corrections - Hold home degludec/glargine, and Januvia    Anemia Thrombocytopenia Elevated serum free light chains Follows with Dr. Davonna at the [facility].  He is not a candidate for chemotherapy, and so decision to biopsy his anemia, thrombocytopenia, and elevated free light chains has been deferred.  Platelets 61K on admission, transfused platelets, increased to 90K here.   Certainly may just be anemia of chronic kidney disease   Nephrolithiasis Kidney stone found week prior to admission, asymptomatic at present.  Microscopic hematuria on UA is from that, infection ruled out          DVT prophylaxis: scds Code Status:  DNR/DNI Family Communication: discussed with son, daughter at bedside. Code status changed to DNR/DNI Disposition Plan: snf  Subjective:  Patient sleeping, wakes up to voice.  Vital signs are stable.  Will start some IV fluid for hydration  Objective: Vitals:   04/12/24 2340 04/13/24 0406 04/13/24 0813 04/13/24 0910  BP: 107/67 103/68 103/63 103/63  Pulse:  76 79   Resp: (!) 21 20 16    Temp: 99.6 F (37.6 C) 98.2 F (36.8 C) 98 F (36.7 C)   TempSrc: Oral Oral Oral   SpO2: 100% 100% 100%   Weight:      Height:        Intake/Output Summary (Last 24 hours) at 04/13/2024 1145  Last data filed at 04/13/2024 0548 Gross per 24 hour  Intake 460 ml  Output 700 ml  Net -240 ml   Filed Weights   04/07/24 0949 04/07/24 2020  Weight: 42.3 kg 39.2 kg    Examination:  General: Chronically ill, cachectic, not in any acute  distress Chest: Clear bilaterally, no wheezing CVS: S1, S2, no murmur, regular rhythm Abdomen: Soft, nontender Extremities: No edema   Data Reviewed: I have personally reviewed following labs and imaging studies  CBC: Recent Labs  Lab 04/07/24 1152 04/09/24 0334 04/10/24 0207 04/11/24 0304 04/12/24 0450  WBC 4.4 13.8* 9.2 13.7* 8.0  HGB 13.6 10.9* 10.7* 10.5* 10.7*  HCT 44.0 32.6* 32.0* 31.1* 31.7*  MCV 96.3 91.8 90.4 90.1 90.1  PLT 64* 95* 90* 98* 88*   Basic Metabolic Panel: Recent Labs  Lab 04/07/24 1047 04/09/24 0334 04/11/24 0304 04/12/24 0450 04/13/24 0642  NA 142 137 135 133* 131*  K 4.1 4.0 3.8 4.7 4.9  CL 110 106 102 100 101  CO2 17* 19* 24 22 20*  GLUCOSE 87 127* 75 279* 182*  BUN 31* 53* 36* 37* 39*  CREATININE 1.81* 2.35* 1.94* 1.84* 1.97*  CALCIUM  8.9 8.3* 8.1* 8.1* 8.2*   GFR: Estimated Creatinine Clearance: 15.5 mL/min (A) (by C-G formula based on SCr of 1.97 mg/dL (H)). Liver Function Tests: Recent Labs  Lab 04/07/24 1047 04/11/24 0304  AST 34 45*  ALT 28 35  ALKPHOS 55 57  BILITOT 0.7 0.6  PROT 8.7* 7.1  ALBUMIN 4.2 3.4*   No results for input(s): LIPASE, AMYLASE in the last 168 hours. Recent Labs  Lab 04/07/24 1527  AMMONIA 19   Coagulation Profile: Recent Labs  Lab 04/07/24 1639  INR 1.3*   Cardiac Enzymes: No results for input(s): CKTOTAL, CKMB, CKMBINDEX, TROPONINI in the last 168 hours. BNP (last 3 results) No results for input(s): PROBNP in the last 8760 hours. HbA1C: No results for input(s): HGBA1C in the last 72 hours. CBG: Recent Labs  Lab 04/12/24 0623 04/12/24 1137 04/12/24 1649 04/12/24 2144 04/13/24 0720  GLUCAP 273* 285* 198* 345* 190*   Lipid Profile: No results for input(s): CHOL, HDL, LDLCALC, TRIG, CHOLHDL, LDLDIRECT in the last 72 hours. Thyroid Function Tests: No results for input(s): TSH, T4TOTAL, FREET4, T3FREE, THYROIDAB in the last 72 hours. Anemia  Panel: No results for input(s): VITAMINB12, FOLATE, FERRITIN, TIBC, IRON , RETICCTPCT in the last 72 hours. Sepsis Labs: No results for input(s): PROCALCITON, LATICACIDVEN in the last 168 hours.  Recent Results (from the past 240 hours)  MRSA Next Gen by PCR, Nasal     Status: Abnormal   Collection Time: 04/07/24  8:25 PM   Specimen: Nasal Mucosa; Nasal Swab  Result Value Ref Range Status   MRSA by PCR Next Gen DETECTED (A) NOT DETECTED Final    Comment: CRITICAL RESULTS CALLED TO, READ BACK BY AND VERIFIED WITH: RN J.TIRRELL ON 04/08/24 AT 0052 BY NM (NOTE) The GeneXpert MRSA Assay (FDA approved for NASAL specimens only), is one component of a comprehensive MRSA colonization surveillance program. It is not intended to diagnose MRSA infection nor to guide or monitor treatment for MRSA infections. Test performance is not FDA approved in patients less than 2 years old. Performed at [facility], [address] [place], [name] [address]          Radiology Studies: No results found.      Scheduled Meds:  amLODipine   2.5 mg Oral Daily  atorvastatin   10 mg Oral Daily   cefadroxil   500 mg Oral Daily   Chlorhexidine  Gluconate Cloth  6 each Topical Daily   donepezil   10 mg Oral QHS   dorzolamide -timolol   1 drop Both Eyes BID   feeding supplement  237 mL Oral BID BM   insulin  aspart  0-15 Units Subcutaneous TID WC   insulin  aspart  0-5 Units Subcutaneous QHS   latanoprost   1 drop Both Eyes QHS   levETIRAcetam   500 mg Oral BID   multivitamin with minerals  1 tablet Oral Daily   mupirocin  ointment   Nasal BID   mouth rinse  15 mL Mouth Rinse 4 times per day   tamsulosin   0.4 mg Oral QPC supper   Continuous Infusions:  sodium chloride  75 mL/hr at 04/13/24 1102          Derryl Duval, MD [facility] 04/13/2024, 11:45 AM   "

## 2024-04-14 DIAGNOSIS — I615 Nontraumatic intracerebral hemorrhage, intraventricular: Secondary | ICD-10-CM | POA: Diagnosis not present

## 2024-04-14 LAB — GLUCOSE, CAPILLARY
Glucose-Capillary: 258 mg/dL — ABNORMAL HIGH (ref 70–99)
Glucose-Capillary: 232 mg/dL — ABNORMAL HIGH (ref 70–99)
Glucose-Capillary: 200 mg/dL — ABNORMAL HIGH (ref 70–99)
Glucose-Capillary: 223 mg/dL — ABNORMAL HIGH (ref 70–99)

## 2024-04-14 MED ORDER — SODIUM CHLORIDE 0.9 % IV SOLN: INTRAVENOUS | Status: AC

## 2024-04-14 MED ORDER — LEVETIRACETAM 500 MG PO TABS
500.0000 mg | ORAL_TABLET | Freq: Two times a day (BID) | ORAL | Status: DC
  Administered 2024-04-14 – 2024-04-21 (×14): 500 mg via ORAL
  Filled 2024-04-14 (×14): qty 1

## 2024-04-14 NOTE — Evaluation (Signed)
 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   Anemia    Arthritis    hands   Carpal tunnel syndrome 08/05/2009   Qualifier: Diagnosis of  By: [name], [name]     Cataract 2017   Chronic kidney disease    Diabetes mellitus without complication (HCC)    Phreesia 04/03/2020   ERECTILE DYSFUNCTION, ORGANIC 01/20/2009   Qualifier: Diagnosis of  By: Antonetta MD, Margaret     GERD (gastroesophageal reflux disease)    Glaucoma    both eyes   History of kidney stones    Hyperlipemia    Hypertension    Iron  deficiency anemia 01/10/2014   Osteoporosis September 2021   Other pancytopenia (HCC) 01/10/2014   New in 01/2014, undergoing pathology review    Rash    front abdomen no drainage   Past Surgical History:  Past Surgical History:  Procedure Laterality Date   AMPUTATION Right 09/01/2016   Procedure: AMPUTATION RIGHT MIDDLE FINGER TIP;  Surgeon: [name], MD;  Location: [facility];  Service: Orthopedics;  Laterality: Right;   AMPUTATION Right 10/10/2016   Procedure: REPEAT IRRIGATION AND DEBRIDEMENT RIGHT MIDDLE FINGER WITH AMPUTATION THROUGH PROXIMAL PHALANX;  Surgeon: [name], MD;  Location: [facility] OR;  Service: Orthopedics;  Laterality: Right;   APPENDECTOMY     BIOPSY  01/13/2022   Procedure: BIOPSY;  Surgeon: [name], MD;  Location: [facility];  Service: Gastroenterology;;   COLONOSCOPY  09/06/2011   Procedure: COLONOSCOPY;  Surgeon: [name], MD;  Location: [facility];  Service: Endoscopy;  Laterality: N/A;  830   COLONOSCOPY WITH PROPOFOL  N/A 01/13/2022   Procedure: COLONOSCOPY WITH  PROPOFOL ;  Surgeon: [name], MD;  Location: [facility];  Service: Gastroenterology;  Laterality: N/A;  200 ASA 3   CYSTOSCOPY/RETROGRADE/URETEROSCOPY/STONE EXTRACTION WITH BASKET     ESOPHAGOGASTRODUODENOSCOPY N/A 01/15/2014   Procedure: ESOPHAGOGASTRODUODENOSCOPY (EGD);  Surgeon: [name], MD;  Location: [facility];  Service: Endoscopy;  Laterality: N/A;  200   ESOPHAGOGASTRODUODENOSCOPY N/A 04/22/2014   Procedure: ESOPHAGOGASTRODUODENOSCOPY (EGD);  Surgeon: [name], MD;  Location: [facility];  Service: Endoscopy;  Laterality: N/A;  240   EXTRACORPOREAL SHOCK WAVE LITHOTRIPSY Left 01/02/2023   Procedure: EXTRACORPOREAL SHOCK WAVE LITHOTRIPSY (ESWL);  Surgeon: [name], MD;  Location: [facility];  Service: Urology;  Laterality: Left;   EYE SURGERY Bilateral yrs ago   ioc for cataract   IR ANGIO EXTERNAL CAROTID SEL EXT CAROTID UNI L MOD SED  03/22/2022   IR ANGIO INTRA EXTRACRAN SEL INTERNAL CAROTID UNI L MOD SED  03/26/2022   IR ANGIOGRAM FOLLOW UP STUDY  03/26/2022   IR NEURO EACH ADD'L AFTER BASIC UNI LEFT (MS)  03/26/2022   IR TRANSCATH/EMBOLIZ  03/26/2022   MALONEY DILATION N/A 01/15/2014   Procedure: AGAPITO DILATION;  Surgeon: [name], MD;  [facility]: [facility];  Service: Endoscopy;  Laterality: N/A;   MALONEY DILATION  04/22/2014   Procedure: AGAPITO DILATION;  Surgeon: [name], MD;  Location: [facility];  Service: Endoscopy;;   POLYPECTOMY  01/13/2022   Procedure: POLYPECTOMY;  Surgeon:  Eartha Flavors, Toribio, MD;  Location: [facility];  Service: Gastroenterology;;   RADIOLOGY WITH ANESTHESIA Left 03/22/2022   Procedure: LEFT MMA EMBOLIZATION;  Surgeon: Lanis Pupa, MD;  Location: [facility] OR;  Service: Radiology;  Laterality: Left;   surgery for left eye bleedf  2017   HPI:  Jacob Barnes is a 84 y.o. male who who presented to ED 12/29 with confusion and generalized weakness. CT head demonstrated IVH centered in  the L choroid plexus with layering hemorrhage in BL occipital lobes. Pt has been pocketing POs; no difficulty with liquids per RN. PMH: dementia, HTN, HLD, GERD, DM2, osteoporosis, anemia, thrombocytopenia, prior L SDH, retinopathy    Assessment / Plan / Recommendation  Clinical Impression  PLAN: Continue dysphagia 1/pureed diet with thin liquids.  Full supervision, assist with feeding due to inattention.  Rec SLP f/u at SNF to address potential for diet to be advanced.  He appears to be protecting airway, but dementia and new neuro deficits are impacting initiative to eat and to eat regular solid foods.  Pt alert, not following commands.  Answered occasional questions about preferences; did not feed himself when offered or assisted.  Allowed removal of upper dentures for cleaning and overall oral care. Dentures replaced.  No CN deficits noted.  Maintained closed eyes for duration of session. Demonstrates oral holding of solids. Consumed vanila pudding with adequate attention and oral clearance. Drank rapid, sequential sips of thin liquids with no s/s of aspiration.   No acute care f/u needed - SLP next venue of care.    SLP Visit Diagnosis: Dysphagia, oral phase (R13.11)    Aspiration Risk  No limitations    Diet Recommendation   Thin;Dysphagia 1 (puree)  Medication Administration: Whole meds with liquid    Other Recommendations Oral Care Recommendations: Oral care BID     Swallow Evaluation Recommendations     Assistance Recommended at Discharge    Functional Status Assessment Patient has had a recent decline in their functional status and/or demonstrates limited ability to make significant improvements in function in a reasonable and predictable amount of time  Frequency and Duration            Prognosis        Swallow Study   General Date of Onset: 04/07/24 HPI: Jacob Barnes is a 84 y.o. male who who presented to ED 12/29 with confusion and generalized weakness. CT head  demonstrated IVH centered in the L choroid plexus with layering hemorrhage in BL occipital lobes. Pt has been pocketing POs; no difficulty with liquids per RN. PMH: dementia, HTN, HLD, GERD, DM2, osteoporosis, anemia, thrombocytopenia, prior L SDH, retinopathy Type of Study: Bedside Swallow Evaluation Previous Swallow Assessment: no Diet Prior to this Study: Dysphagia 1 (pureed);Thin liquids (Level 0) Temperature Spikes Noted: No Respiratory Status: Room air History of Recent Intubation: No Behavior/Cognition: Alert Oral Cavity Assessment: Within Functional Limits Oral Care Completed by SLP: Yes Oral Cavity - Dentition: Dentures, top;Missing dentition Vision: Impaired for self-feeding Self-Feeding Abilities: Total assist Patient Positioning: Upright in bed Baseline Vocal Quality: Normal Volitional Cough: Cognitively unable to elicit Volitional Swallow: Unable to elicit    Oral/Motor/Sensory Function Overall Oral Motor/Sensory Function: Within functional limits   Ice Chips     Thin Liquid Thin Liquid: Within functional limits    Nectar Thick Nectar Thick Liquid: Not tested   Honey Thick Honey Thick Liquid: Not tested   Puree Puree: Within functional limits   Solid     Solid:  Impaired Oral Phase Functional Implications: Oral holding      Kimari Coudriet Laurice 04/14/2024,2:46 PM  Alan L. Vona, MA CCC/SLP Clinical Specialist - Acute Care SLP Acute Rehabilitation Services Office number [phone]

## 2024-04-14 NOTE — Progress Notes (Signed)
 " PROGRESS NOTE    Jacob Barnes  FMW:[id number] DOB: 10/09/1940 DOA: 04/07/2024 PCP: [name], MD   Brief Narrative:   84 y.o. M with dementia, lives at home, HTN, DM, malnutrition, CKD IV baseline 1.9-2.1 who presented with small intracranial hemorrhage.  This remains stable.  Hospital course is notable for poor intake, dehydration, altered mentation likely delirium.  Assessment & Plan:   Principal Problem:   Intraventricular hemorrhage (HCC) Active Problems:   Pressure injury of skin   Protein-calorie malnutrition, severe  Nontraumatic small subarachnoid hemorrhage Small intraventricular hemorrhage Small subdural hematoma Patient had had fall several days before Christmas.  Came to ER on Christmas for flank pain, found to have kidney stone.  Over next few days, family noticed he seemed more off, weaker, brought him to the ER. There, CT head 12/29 revealed small subdural hematoma, subarachnoid hemorrhage which remained stable on follow-up CT same day. - Head CT was repeated 1/4 due to increasing somnolence.  Shows decreasing intraventricular hemorrhage and unchanged mild hydrocephalus. - Neurology saw the patient earlier in the course and has signed off   EEG unremarkable, echocardiogram showed severe LVH, but no embolic source LDL 11, hemoglobin A1c 8.3% - PT/OT - Avoid aspirin  - Continue Lipitor, Keppra   Acute encephalopathy/delirium: Exacerbated by brain bleed/dementia.,  dehydration.  Hypoactive/somnolent with periods of alertness -Will follow delirium precautions. - Hold Aricept . - Avoid deliriogenic medications. - Hydrate.  Continue NS at 50 cc/h with close monitoring of electrolytes and kidney function.  Suspected aspiration pneumonia Fever 1/2.  Not able to articulate any symptoms.  Denies cough, chest pain, dysuria, suprapubic pain.  Given his poor prognosis, rather than get chest x-ray, I will treat aspiration empirically. - Cefadroxil  twice daily start  04/11/24 for 5 days     Severe protein calorie malnutrition - Continue feeding supplements - Recommend palliative care to follow at SNF   Chronic kidney disease stage IV Creatinine stable relative to baseline, suspect GFR significantly lower than predicted baseline creatinine given his severe muscle wasting.   Severe LVH Small pericardial effusion This is an incidental finding in the hospital on echocardiogram.  Cardiology were consulted, they recommended outpatient follow-up - Follow-up with cardiologist outpatient, pending goals of care    Cerebrovascular disease Hypertension Hyperlipidemia Blood pressure high normal - Continue amlodipine  - Continue Lipitor   History of seizures - Continue Keppra    Diabetes with hyperglycemia and chronic kidney disease Glucose mostly controlled in the last 24 hours - Continue sliding scale corrections - Hold home degludec/glargine, and Januvia    Anemia Thrombocytopenia Elevated serum free light chains Follows with Dr. Davonna at the [facility] cancer center.  He is not a candidate for chemotherapy, and so decision to biopsy his anemia, thrombocytopenia, and elevated free light chains has been deferred.  Platelets 61K on admission, transfused platelets, increased to 90K here.   Certainly may just be anemia of chronic kidney disease   Nephrolithiasis Kidney stone found week prior to admission, asymptomatic at present.  Microscopic hematuria on UA is from that, infection ruled out  Dysphagia: Will consult SLP    DVT prophylaxis: scds Code Status:  DNR/DNI Family Communication: discussed with son, daughter at bedside. Code status changed to DNR/DNI 1/4 Disposition Plan: snf  Subjective:  Patient is alert today, confused.  Nursing reported dysphagia with pocketing of food in the mouth.  Objective: Vitals:   04/14/24 0400 04/14/24 0727 04/14/24 0856 04/14/24 1145  BP: 121/71 109/61 109/61 137/79  Pulse: 78 80  71  Resp: 17 16  16    Temp: 98.4 F (36.9 C) 98.1 F (36.7 C)  97.9 F (36.6 C)  TempSrc: Axillary Oral  Oral  SpO2:  100%  100%  Weight:      Height:        Intake/Output Summary (Last 24 hours) at 04/14/2024 1334 Last data filed at 04/14/2024 9072 Gross per 24 hour  Intake 420 ml  Output 2050 ml  Net -1630 ml   Filed Weights   04/07/24 0949 04/07/24 2020  Weight: 42.3 kg 39.2 kg    Examination:  General: Chronically ill, cachectic, not in any acute distress Chest: Clear bilaterally, no wheezing CVS: S1, S2, no murmur, regular rhythm Abdomen: Soft, nontender Extremities: No edema   Data Reviewed: I have personally reviewed following labs and imaging studies  CBC: Recent Labs  Lab 04/09/24 0334 04/10/24 0207 04/11/24 0304 04/12/24 0450  WBC 13.8* 9.2 13.7* 8.0  HGB 10.9* 10.7* 10.5* 10.7*  HCT 32.6* 32.0* 31.1* 31.7*  MCV 91.8 90.4 90.1 90.1  PLT 95* 90* 98* 88*   Basic Metabolic Panel: Recent Labs  Lab 04/09/24 0334 04/11/24 0304 04/12/24 0450 04/13/24 0642  NA 137 135 133* 131*  K 4.0 3.8 4.7 4.9  CL 106 102 100 101  CO2 19* 24 22 20*  GLUCOSE 127* 75 279* 182*  BUN 53* 36* 37* 39*  CREATININE 2.35* 1.94* 1.84* 1.97*  CALCIUM  8.3* 8.1* 8.1* 8.2*   GFR: Estimated Creatinine Clearance: 15.5 mL/min (A) (by C-G formula based on SCr of 1.97 mg/dL (H)). Liver Function Tests: Recent Labs  Lab 04/11/24 0304  AST 45*  ALT 35  ALKPHOS 57  BILITOT 0.6  PROT 7.1  ALBUMIN 3.4*   No results for input(s): LIPASE, AMYLASE in the last 168 hours. Recent Labs  Lab 04/07/24 1527  AMMONIA 19   Coagulation Profile: Recent Labs  Lab 04/07/24 1639  INR 1.3*   Cardiac Enzymes: No results for input(s): CKTOTAL, CKMB, CKMBINDEX, TROPONINI in the last 168 hours. BNP (last 3 results) No results for input(s): PROBNP in the last 8760 hours. HbA1C: No results for input(s): HGBA1C in the last 72 hours. CBG: Recent Labs  Lab 04/13/24 1147 04/13/24 1722  04/13/24 2118 04/14/24 0622 04/14/24 1119  GLUCAP 170* 291* 186* 258* 232*   Lipid Profile: No results for input(s): CHOL, HDL, LDLCALC, TRIG, CHOLHDL, LDLDIRECT in the last 72 hours. Thyroid Function Tests: No results for input(s): TSH, T4TOTAL, FREET4, T3FREE, THYROIDAB in the last 72 hours. Anemia Panel: No results for input(s): VITAMINB12, FOLATE, FERRITIN, TIBC, IRON , RETICCTPCT in the last 72 hours. Sepsis Labs: No results for input(s): PROCALCITON, LATICACIDVEN in the last 168 hours.  Recent Results (from the past 240 hours)  MRSA Next Gen by PCR, Nasal     Status: Abnormal   Collection Time: 04/07/24  8:25 PM   Specimen: Nasal Mucosa; Nasal Swab  Result Value Ref Range Status   MRSA by PCR Next Gen DETECTED (A) NOT DETECTED Final    Comment: CRITICAL RESULTS CALLED TO, READ BACK BY AND VERIFIED WITH: RN J.TIRRELL ON 04/08/24 AT 0052 BY NM (NOTE) The GeneXpert MRSA Assay (FDA approved for NASAL specimens only), is one component of a comprehensive MRSA colonization surveillance program. It is not intended to diagnose MRSA infection nor to guide or monitor treatment for MRSA infections. Test performance is not FDA approved in patients less than 1 years old. Performed at [facility], [address] [place], [name] [address]  Radiology Studies: CT HEAD WO CONTRAST ( ) Result Date: 04/14/2024 EXAM: CT HEAD WITHOUT 04/13/2024 08:12:15 PM TECHNIQUE: CT of the head was performed without the administration of intravenous contrast. Automated exposure control, iterative reconstruction, and/or weight based adjustment of the mA/kV was utilized to reduce the radiation dose to as low as reasonably achievable. COMPARISON: CT head 04/07/2024 CLINICAL HISTORY: Altered mental status, nontraumatic (Ped 0-17y); f/u brain bleeding FINDINGS: BRAIN AND VENTRICLES: Decreased intraventricular hemorrhage with some redistribution of  hemorrhage into the occipital horns. Patchy white matter hypodensities, compatible with chronic microvascular ischemic change. No mass effect or midline shift. No extra-axial fluid collection. No evidence of acute infarct. Unchanged mild hydrocephalus. ORBITS: No acute abnormality. SINUSES AND MASTOIDS: No acute abnormality. SOFT TISSUES AND SKULL: No acute skull fracture. No acute soft tissue abnormality. IMPRESSION: 1. Decreased intraventricular hemorrhage. 2. Unchanged mild hydrocephalus. Electronically signed by: Gilmore Molt 04/14/2024 01:27 AM EST RP Workstation: [id number]        Scheduled Meds:  amLODipine   2.5 mg Oral Daily   atorvastatin   10 mg Oral Daily   cefadroxil   500 mg Oral Daily   Chlorhexidine  Gluconate Cloth  6 each Topical Daily   dorzolamide -timolol   1 drop Both Eyes BID   feeding supplement  237 mL Oral BID BM   insulin  aspart  0-15 Units Subcutaneous TID WC   insulin  aspart  0-5 Units Subcutaneous QHS   latanoprost   1 drop Both Eyes QHS   levETIRAcetam   500 mg Oral BID   multivitamin with minerals  1 tablet Oral Daily   mupirocin  ointment   Nasal BID   mouth rinse  15 mL Mouth Rinse 4 times per day   tamsulosin   0.4 mg Oral QPC supper   Continuous Infusions:  sodium chloride  50 mL/hr at 04/14/24 1151          Derryl Duval, MD [facility] 04/14/2024, 1:34 PM   "

## 2024-04-14 NOTE — Plan of Care (Signed)
 Seen by speech today. Diet change to puree and thin liquids. IVF patent. Upper dentures removed and cleaned.    Problem: Education: Goal: Knowledge of General Education information will improve Description: Including pain rating scale, medication(s)/side effects and non-pharmacologic comfort measures Outcome: Progressing   Problem: Activity: Goal: Risk for activity intolerance will decrease Outcome: Progressing   Problem: Nutrition: Goal: Adequate nutrition will be maintained Outcome: Progressing   Problem: Pain Managment: Goal: General experience of comfort will improve and/or be controlled Outcome: Progressing   Problem: Safety: Goal: Ability to remain free from injury will improve Outcome: Progressing   Problem: Skin Integrity: Goal: Risk for impaired skin integrity will decrease Outcome: Progressing

## 2024-04-14 NOTE — TOC Progression Note (Addendum)
 Transition of Care [facility]) - Progression Note    Patient Details  Name: Jacob Barnes MRN: [id number] Date of Birth: 1940/10/06  Transition of [facility]) CM/SW Contact  [name], [name] Phone Number: 04/14/2024, 2:06 PM  Clinical Narrative:   CSW contacted [facility] to ask about reviewing referral. [name] is unable to offer a bed. CSW updated daughter, [name], and answered questions. Daughter disappointed but agreeable to [facility] if that's the only option. CSW to request insurance authorization, will need updated therapy notes and no one assigned today. CSW uploaded information for PASRR review to [employer], awaiting PASRR number. CSW to follow.  UPDATE 2:25 PM: Patient's PASRR number has been approved: [id number]    Expected Discharge Plan: Skilled Nursing Facility Barriers to Discharge: Continued Medical Work up, English As A Second Language Teacher, Engineer, Mining)               Expected Discharge Plan and Services       Living arrangements for the past 2 months: Single Family Home                                       Social Drivers of Health (SDOH) Interventions SDOH Screenings   Food Insecurity: Food Insecurity Present (02/05/2024)  Housing: Low Risk (02/05/2024)  Transportation Needs: No Transportation Needs (02/05/2024)  Utilities: Not At Risk (02/05/2024)  Alcohol Screen: Low Risk (02/05/2024)  Depression (PHQ2-9): Low Risk (02/22/2024)  Financial Resource Strain: Patient Declined (02/05/2024)  Physical Activity: Inactive (02/05/2024)  Social Connections: Moderately Integrated (02/05/2024)  Stress: No Stress Concern Present (02/05/2024)  Tobacco Use: Low Risk (04/07/2024)  Health Literacy: Adequate Health Literacy (02/05/2024)    Readmission Risk Interventions     No data to display

## 2024-04-15 ENCOUNTER — Inpatient Hospital Stay (HOSPITAL_COMMUNITY)

## 2024-04-15 LAB — GLUCOSE, CAPILLARY
Glucose-Capillary: 152 mg/dL — ABNORMAL HIGH (ref 70–99)
Glucose-Capillary: 199 mg/dL — ABNORMAL HIGH (ref 70–99)
Glucose-Capillary: 229 mg/dL — ABNORMAL HIGH (ref 70–99)
Glucose-Capillary: 209 mg/dL — ABNORMAL HIGH (ref 70–99)

## 2024-04-15 NOTE — Progress Notes (Signed)
 Occupational Therapy Treatment Patient Details Name: Jacob Barnes MRN: [id number] DOB: Oct 13, 1940 Today's Date: 04/15/2024   History of present illness Jacob Barnes is a 84 y.o. male who is brought in to the ED with confusion and generalized weakness. CT head demonstrated IVH centered in the L choroid plexus with layering hemorrhage in BL occipital lobes. PMH: dementia, HTN, HLD, GERD, DM2, osteoporosis, anemia, thrombocytopenia, prior L SDH, retinopathy   OT comments  Patient seen with PT to address bed mobility and transfers. Patient demonstrating gains with bed mobility with mod assist.  Patient continues to require +2 assist for sit to stands and transfers with difficulty following directions for standing and self care.  Patient will benefit from continued inpatient follow up therapy, <3 hours/day.  Acute OT to continue to follow to address established goals to facilitate DC to next venue of care.        If plan is discharge home, recommend the following:  Two people to help with walking and/or transfers;Two people to help with bathing/dressing/bathroom;Assistance with cooking/housework;Assist for transportation;Help with stairs or ramp for entrance;Direct supervision/assist for financial management;Direct supervision/assist for medications management;Assistance with feeding;Supervision due to cognitive status   Equipment Recommendations  Other (comment) (defer)    Recommendations for Other Services      Precautions / Restrictions Precautions Precautions: Fall Recall of Precautions/Restrictions: Impaired Precaution/Restrictions Comments: monitor for orthostatics Restrictions Weight Bearing Restrictions Per Provider Order: No       Mobility Bed Mobility Overal bed mobility: Needs Assistance Bed Mobility: Supine to Sit     Supine to sit: Mod assist          Transfers Overall transfer level: Needs assistance Equipment used: 2 person hand held assist, Ambulation equipment  used Transfers: Sit to/from Stand, Bed to chair/wheelchair/BSC Sit to Stand: Mod assist, +2 physical assistance, Max assist           General transfer comment: Pt able to stand a total of 5 times today. Mostly used stedy with pt requiring +2 Max A from lower surfaces and +2 Mod A from flaps and elevated surfaces. PT was able to stand once on scale for nursing to take weight with +2 Mod A and pt briefly able to maintain static balance. Transfer via Lift Equipment: Stedy   Balance Overall balance assessment: Needs assistance Sitting-balance support: Feet supported, Bilateral upper extremity supported, Single extremity supported Sitting balance-Leahy Scale: Poor Sitting balance - Comments: initially max A, progressing to CGA with sustained mild posterior lean, worsens with perturbations. Postural control: Posterior lean, Left lateral lean Standing balance support: Bilateral upper extremity supported, During functional activity Standing balance-Leahy Scale: Zero Standing balance comment: reliant on therapist and Stedy to stand                           ADL either performed or assessed with clinical judgement   ADL Overall ADL's : Needs assistance/impaired     Grooming: Maximal assistance;Sitting;Wash/dry face Grooming Details (indicate cue type and reason): hand over hand to initiate and to perform                                    Extremity/Trunk Assessment              Vision       Perception     Praxis     Communication Communication Communication: Impaired Factors Affecting  Communication: Hearing impaired (Very HOH)   Cognition Arousal: Obtunded Behavior During Therapy: Flat affect Cognition: History of cognitive impairments             OT - Cognition Comments: history of dementia                 Following commands: Impaired Following commands impaired: Follows one step commands inconsistently      Cueing   Cueing  Techniques: Verbal cues, Tactile cues  Exercises Exercises: General Upper Extremity General Exercises - Upper Extremity Shoulder Flexion: AAROM, Left, 5 reps Shoulder ABduction: AAROM, Left, 5 reps Elbow Flexion: AAROM, Left, 5 reps Elbow Extension: AAROM, Left, 5 reps    Shoulder Instructions       General Comments BP 101/77 (85)    Pertinent Vitals/ Pain       Pain Assessment Pain Assessment: No/denies pain  Home Living                                          Prior Functioning/Environment              Frequency  Min 2X/week        Progress Toward Goals  OT Goals(current goals can now be found in the care plan section)  Progress towards OT goals: Progressing toward goals  Acute Rehab OT Goals Patient Stated Goal: none stated OT Goal Formulation: Patient unable to participate in goal setting Time For Goal Achievement: 04/22/24 Potential to Achieve Goals: Good ADL Goals Pt Will Perform Grooming: with min assist;sitting Pt Will Perform Upper Body Bathing: with min assist;sitting Pt Will Transfer to Toilet: with min assist;bedside commode;stand pivot transfer Additional ADL Goal #1: Pt will complete bed mobility with mod assist and maintain sitting balance during ADls for 5 minutes with supervision.  Plan      Co-evaluation    PT/OT/SLP Co-Evaluation/Treatment: Yes Reason for Co-Treatment: Necessary to address cognition/behavior during functional activity;For patient/therapist safety;To address functional/ADL transfers PT goals addressed during session: Mobility/safety with mobility;Balance OT goals addressed during session: ADL's and self-care      AM-PAC OT 6 Clicks Daily Activity     Outcome Measure   Help from another person eating meals?: A Lot Help from another person taking care of personal grooming?: A Lot Help from another person toileting, which includes using toliet, bedpan, or urinal?: Total Help from another person  bathing (including washing, rinsing, drying)?: Total Help from another person to put on and taking off regular upper body clothing?: Total Help from another person to put on and taking off regular lower body clothing?: Total 6 Click Score: 8    End of Session Equipment Utilized During Treatment: Gait belt;Other (comment) [name])  OT Visit Diagnosis: Other abnormalities of gait and mobility (R26.89);Muscle weakness (generalized) (M62.81);Other symptoms and signs involving the nervous system (R29.898)   Activity Tolerance Patient tolerated treatment well   Patient Left in chair;with call bell/phone within reach;with chair alarm set   Nurse Communication Mobility status;Need for lift equipment        Time: 8870-8845 OT Time Calculation (min): 25 min  Charges: OT General Charges $OT Visit: 1 Visit OT Treatments $Therapeutic Activity: 8-22 mins  Dick Laine, OTA Acute Rehabilitation Services  Office [phone]   Jeb LITTIE Laine 04/15/2024, 2:20 PM

## 2024-04-15 NOTE — Progress Notes (Signed)
 " PROGRESS NOTE    Jacob Barnes  FMW:[id number] DOB: 08/07/1940 DOA: 04/07/2024 PCP: [name], MD  Brief Narrative:    84 y.o. M with dementia, lives at home, HTN, DM, malnutrition, CKD IV baseline 1.9-2.1 who presented with small intracranial hemorrhage.  This remains stable.  Hospital course is notable for poor intake, dehydration, altered mentation likely delirium.   Assessment & Plan:   Principal Problem:   Intraventricular hemorrhage (HCC) Active Problems:   Pressure injury of skin   Protein-calorie malnutrition, severe   Nontraumatic small subarachnoid hemorrhage Small intraventricular hemorrhage Small subdural hematoma Patient had had fall several days before Christmas.  Came to ER on Christmas for flank pain, found to have kidney stone.  Over next few days, family noticed he seemed more off, weaker, brought him to the ER. There, CT head 12/29 revealed small subdural hematoma, subarachnoid hemorrhage which remained stable on follow-up CT same day. - Head CT was repeated 1/4 due to increasing somnolence.  Shows decreasing intraventricular hemorrhage and unchanged mild hydrocephalus. - Neurology saw the patient earlier in the course and has signed off - 04/15/2024-More left usually.  No obvious medications.  No sedatives.  Repeat head CT to make sure he is not developing an increasing obstructive  hydrocephalus.   EEG unremarkable, echocardiogram showed severe LVH, but no embolic source LDL 11, hemoglobin A1c 8.3% - PT/OT - Avoid aspirin  - Continue Lipitor, Keppra    Acute encephalopathy/delirium: Exacerbated by brain bleed/dementia.,  dehydration.  Hypoactive/somnolent with periods of alertness -Will follow delirium precautions. - Hold Aricept . - Avoid deliriogenic medications. - Hydrate.  Continue NS at 50 cc/h with close monitoring of electrolytes and kidney function.   Suspected aspiration pneumonia Fever 1/2.  Not able to articulate any symptoms.  Denies cough,  chest pain, dysuria, suprapubic pain.  Given his poor prognosis, rather than get chest x-ray, I will treat aspiration empirically. - Cefadroxil  twice daily start 04/11/24 for 5 days     Severe protein calorie malnutrition - Continue feeding supplements - Recommend palliative care to follow at SNF   Chronic kidney disease stage IV Creatinine stable relative to baseline, suspect GFR significantly lower than predicted baseline creatinine given his severe muscle wasting.   Severe LVH Small pericardial effusion This is an incidental finding in the hospital on echocardiogram.  Cardiology were consulted, they recommended outpatient follow-up - Follow-up with cardiologist outpatient, pending goals of care    Cerebrovascular disease Hypertension Hyperlipidemia Blood pressure high normal - Continue amlodipine  - Continue Lipitor   History of seizures - Continue Keppra    Diabetes with hyperglycemia and chronic kidney disease Glucose mostly controlled in the last 24 hours - Continue sliding scale corrections - Hold home degludec/glargine, and Januvia    Anemia Thrombocytopenia Elevated serum free light chains Follows with Dr. Davonna at the [facility] center.  He is not a candidate for chemotherapy, and so decision to biopsy his anemia, thrombocytopenia, and elevated free light chains has been deferred.  Platelets 61K on admission, transfused platelets, increased to 90K here.   Certainly may just be anemia of chronic kidney disease   Nephrolithiasis Kidney stone found week prior to admission, asymptomatic at present.  Microscopic hematuria on UA is from that, infection ruled out   Dysphagia: PLAN: Continue dysphagia 1/pureed diet with thin liquids.  Full supervision, assist with feeding due to inattention.  Rec SLP f/u at SNF to address potential for diet to be advanced.  He appears to be protecting airway, but dementia and new  neuro deficits are impacting initiative to eat and to eat  regular solid foods.    DVT prophylaxis: Place and maintain sequential compression device Start: 04/08/24 0931  SCDs   Code Status: Limited: Do not attempt resuscitation (DNR) -DNR-LIMITED -Do Not Intubate/DNI  Family Communication: left message for son Disposition Plan: SNF Reason for continuing need for hospitalization: lethargy, r/o significant obstructing hydrocephalus  Objective: Vitals:   04/14/24 2010 04/14/24 2359 04/15/24 0342 04/15/24 0848  BP: 121/84 121/78 118/72 105/70  Pulse: 88 93 87 92  Resp:  18 17 18   Temp: 99.1 F (37.3 C) 98.7 F (37.1 C) 97.8 F (36.6 C)   TempSrc: Axillary Oral Oral   SpO2: 100% 100% 100% 97%  Weight:      Height:        Intake/Output Summary (Last 24 hours) at 04/15/2024 0915 Last data filed at 04/15/2024 0829 Gross per 24 hour  Intake 1100 ml  Output 2250 ml  Net -1150 ml   Filed Weights   04/07/24 0949 04/07/24 2020  Weight: 42.3 kg 39.2 kg    Examination:  He was lethargic this morning on rounds.  He could barely keep his eyes open. He could not follow instructions. His gaze when his eyes were open was to the left and his eyes were disconjugate.  No nystagmus. Regular rate and rhythm no murmurs rubs gallops Lungs were clear throughout with poor effort Abdomen soft Extremities without edema  Data Reviewed: I have personally reviewed following labs and imaging studies  CBC: Recent Labs  Lab 04/09/24 0334 04/10/24 0207 04/11/24 0304 04/12/24 0450  WBC 13.8* 9.2 13.7* 8.0  HGB 10.9* 10.7* 10.5* 10.7*  HCT 32.6* 32.0* 31.1* 31.7*  MCV 91.8 90.4 90.1 90.1  PLT 95* 90* 98* 88*   Basic Metabolic Panel: Recent Labs  Lab 04/09/24 0334 04/11/24 0304 04/12/24 0450 04/13/24 0642  NA 137 135 133* 131*  K 4.0 3.8 4.7 4.9  CL 106 102 100 101  CO2 19* 24 22 20*  GLUCOSE 127* 75 279* 182*  BUN 53* 36* 37* 39*  CREATININE 2.35* 1.94* 1.84* 1.97*  CALCIUM  8.3* 8.1* 8.1* 8.2*   GFR: Estimated Creatinine Clearance: 15.5  mL/min (A) (by C-G formula based on SCr of 1.97 mg/dL (H)). Liver Function Tests: Recent Labs  Lab 04/11/24 0304  AST 45*  ALT 35  ALKPHOS 57  BILITOT 0.6  PROT 7.1  ALBUMIN 3.4*   No results for input(s): LIPASE, AMYLASE in the last 168 hours. No results for input(s): AMMONIA in the last 168 hours. Coagulation Profile: No results for input(s): INR, PROTIME in the last 168 hours. Cardiac Enzymes: No results for input(s): CKTOTAL, CKMB, CKMBINDEX, TROPONINI in the last 168 hours. ProBNP, BNP (last 5 results) No results for input(s): PROBNP, BNP in the last 8760 hours. HbA1C: No results for input(s): HGBA1C in the last 72 hours. CBG: Recent Labs  Lab 04/14/24 0622 04/14/24 1119 04/14/24 1647 04/14/24 2113 04/15/24 0609  GLUCAP 258* 232* 200* 223* 152*   Lipid Profile: No results for input(s): CHOL, HDL, LDLCALC, TRIG, CHOLHDL, LDLDIRECT in the last 72 hours. Thyroid Function Tests: No results for input(s): TSH, T4TOTAL, FREET4, T3FREE, THYROIDAB in the last 72 hours. Anemia Panel: No results for input(s): VITAMINB12, FOLATE, FERRITIN, TIBC, IRON , RETICCTPCT in the last 72 hours. Sepsis Labs: No results for input(s): PROCALCITON, LATICACIDVEN in the last 168 hours.  Recent Results (from the past 240 hours)  MRSA Next Gen by PCR, Nasal  Status: Abnormal   Collection Time: 04/07/24  8:25 PM   Specimen: Nasal Mucosa; Nasal Swab  Result Value Ref Range Status   MRSA by PCR Next Gen DETECTED (A) NOT DETECTED Final    Comment: CRITICAL RESULTS CALLED TO, READ BACK BY AND VERIFIED WITH: RN J.TIRRELL ON 04/08/24 AT 0052 BY NM (NOTE) The GeneXpert MRSA Assay (FDA approved for NASAL specimens only), is one component of a comprehensive MRSA colonization surveillance program. It is not intended to diagnose MRSA infection nor to guide or monitor treatment for MRSA infections. Test performance is not FDA approved in  patients less than 30 years old. Performed at [facility], [address], [place], [name] [address]      Radiology Studies: CT HEAD WO CONTRAST ( ) Result Date: 04/14/2024 EXAM: CT HEAD WITHOUT 04/13/2024 08:12:15 PM TECHNIQUE: CT of the head was performed without the administration of intravenous contrast. Automated exposure control, iterative reconstruction, and/or weight based adjustment of the mA/kV was utilized to reduce the radiation dose to as low as reasonably achievable. COMPARISON: CT head 04/07/2024 CLINICAL HISTORY: Altered mental status, nontraumatic (Ped 0-17y); f/u brain bleeding FINDINGS: BRAIN AND VENTRICLES: Decreased intraventricular hemorrhage with some redistribution of hemorrhage into the occipital horns. Patchy white matter hypodensities, compatible with chronic microvascular ischemic change. No mass effect or midline shift. No extra-axial fluid collection. No evidence of acute infarct. Unchanged mild hydrocephalus. ORBITS: No acute abnormality. SINUSES AND MASTOIDS: No acute abnormality. SOFT TISSUES AND SKULL: No acute skull fracture. No acute soft tissue abnormality. IMPRESSION: 1. Decreased intraventricular hemorrhage. 2. Unchanged mild hydrocephalus. Electronically signed by: Gilmore Molt 04/14/2024 01:27 AM EST RP Workstation: [id number]    Scheduled Meds:  amLODipine   2.5 mg Oral Daily   atorvastatin   10 mg Oral Daily   Chlorhexidine  Gluconate Cloth  6 each Topical Daily   dorzolamide -timolol   1 drop Both Eyes BID   feeding supplement  237 mL Oral BID BM   insulin  aspart  0-15 Units Subcutaneous TID WC   insulin  aspart  0-5 Units Subcutaneous QHS   latanoprost   1 drop Both Eyes QHS   levETIRAcetam   500 mg Oral BID   multivitamin with minerals  1 tablet Oral Daily   mupirocin  ointment   Nasal BID   mouth rinse  15 mL Mouth Rinse 4 times per day   tamsulosin   0.4 mg Oral QPC supper   Continuous Infusions:  sodium chloride  50 mL/hr at 04/14/24 1151      LOS: 8 days   Time spent: 45 minutes  Lonni KANDICE Moose, MD  [facility]  04/15/2024, 9:15 AM   "

## 2024-04-15 NOTE — Progress Notes (Addendum)
 Nutrition Follow-up  DOCUMENTATION CODES:  Severe malnutrition in context of chronic illness, Underweight  INTERVENTION:  Continue current diet as ordered per SLP Encourage PO intake Ordering assistance Ensure Plus High Protein po BID, each supplement provides 350 kcal and 20 grams of protein New Measured weight  NUTRITION DIAGNOSIS:  Severe Malnutrition related to chronic illness as evidenced by severe fat depletion, severe muscle depletion. - remains applicable   GOAL:  Patient will meet greater than or equal to 90% of their needs - progressing  MONITOR:  TF tolerance  REASON FOR ASSESSMENT:  Consult Assessment of nutrition requirement/status  ASSESSMENT:  Pt with PMH of HTN, HLD, DM, iron -deficiency anemia, thrombocytopenia, prior L SDH, retinopathy, GERD admitted with nontraumatic ICH.  12/29 - presented to ED 12/31 - transferred to 3W  Pt resting in bed at the time of assessment. Sleeping soundly and no family present at bedside. Reviewed intake and pt with fair intake of meals. Noted that overall is stable for discharge and PASRR has been approved. Waiting on insurance authorization.   Admit / Current weight: 39.2 kg    Average Meal Intake: 1/1-1/6: 66% intake x 8 recorded meals  Nutritionally Relevant Medications: Scheduled Meds:  atorvastatin   10 mg Oral Daily   feeding supplement  237 mL Oral BID BM   insulin  aspart  0-15 Units Subcutaneous TID WC   insulin  aspart  0-5 Units Subcutaneous QHS   levETIRAcetam   500 mg Oral BID   multivitamin with minerals  1 tablet Oral Daily   Continuous Infusions:  sodium chloride  50 mL/hr at 04/14/24 1151   Labs Reviewed: CBG ranges from 152-258 mg/dL over the last 24 hours HgbA1c 8.3% (10/15/23)  NUTRITION - FOCUSED PHYSICAL EXAM: Flowsheet Row Most Recent Value  Orbital Region Severe depletion  Upper Arm Region Severe depletion  Thoracic and Lumbar Region Severe depletion  Buccal Region Severe depletion  Temple  Region Severe depletion  Clavicle Bone Region Severe depletion  Clavicle and Acromion Bone Region Severe depletion  Scapular Bone Region Severe depletion  Dorsal Hand Severe depletion  Patellar Region Severe depletion  Anterior Thigh Region Severe depletion  Posterior Calf Region Severe depletion  Edema (RD Assessment) None  Hair Reviewed  Eyes Reviewed  Mouth Reviewed  [upper dentures,  missing lower teeth]  Skin Reviewed  Nails Reviewed    Diet Order:   Diet Order             DIET - DYS 1 Room service appropriate? Yes with Assist; Fluid consistency: Thin  Diet effective now                   EDUCATION NEEDS:  Education needs have been addressed  Skin:  Skin Assessment: Skin Integrity Issues: Skin Integrity Issues:: Stage I Stage I: sacrum Traumatic injury - left lateral thigh, (0.4 x 0.4 x 0.1)  Last BM:  1/4 per flowsheet  Height:  Ht Readings from Last 1 Encounters:  04/07/24 5' 4 (1.626 m)    Weight:  Wt Readings from Last 1 Encounters:  04/07/24 39.2 kg    Ideal Body Weight:  59.1 kg  BMI:  Body mass index is 14.83 kg/m.  Estimated Nutritional Needs:  Kcal:  1200-1300 Protein:  60-70 grams Fluid:  >1.5 L/day    Vernell Lukes, RD, LDN, CNSC Registered Dietitian II Please reach out via secure chat

## 2024-04-15 NOTE — Plan of Care (Signed)
   Problem: Nutrition: Goal: Adequate nutrition will be maintained Outcome: Progressing

## 2024-04-15 NOTE — Progress Notes (Signed)
 Physical Therapy Treatment Patient Details Name: Jacob Barnes MRN: [id number] DOB: 12/15/1940 Today's Date: 04/15/2024   History of Present Illness Jacob Barnes is a 84 y.o. male who is brought in to the ED with confusion and generalized weakness. CT head demonstrated IVH centered in the L choroid plexus with layering hemorrhage in BL occipital lobes. PMH: dementia, HTN, HLD, GERD, DM2, osteoporosis, anemia, thrombocytopenia, prior L SDH, retinopathy    PT Comments  Pt tolerated treatment well today. Co-treat with OT. Pt able to stand a total of 5 times today. Mostly used stedy with pt requiring +2 Max A from lower surfaces and +2 Mod A from flaps and elevated surfaces. PT was able to stand once on scale for nursing to take weight with +2 Mod A and pt briefly able to maintain static balance. No change in DC/DME recs at this time. PT will continue to follow.      If plan is discharge home, recommend the following: Assist for transportation;Help with stairs or ramp for entrance;Supervision due to cognitive status;Two people to help with walking and/or transfers;Two people to help with bathing/dressing/bathroom;Assistance with cooking/housework   Can travel by private vehicle     No  Equipment Recommendations  Hospital bed;Hoyer lift;Wheelchair (measurements PT);Wheelchair cushion (measurements PT)    Recommendations for Other Services       Precautions / Restrictions Precautions Precautions: Fall Recall of Precautions/Restrictions: Impaired Precaution/Restrictions Comments: monitor for orthostatics Restrictions Weight Bearing Restrictions Per Provider Order: No     Mobility  Bed Mobility Overal bed mobility: Needs Assistance Bed Mobility: Supine to Sit     Supine to sit: Mod assist          Transfers Overall transfer level: Needs assistance Equipment used: 2 person hand held assist, Ambulation equipment used Transfers: Sit to/from Stand, Bed to chair/wheelchair/BSC Sit to  Stand: Mod assist, +2 physical assistance, Max assist           General transfer comment: Pt able to stand a total of 5 times today. Mostly used stedy with pt requiring +2 Max A from lower surfaces and +2 Mod A from flaps and elevated surfaces. PT was able to stand once on scale for nursing to take weight with +2 Mod A and pt briefly able to maintain static balance. Transfer via Lift Equipment: Stedy  Ambulation/Gait               General Gait Details: Deferred for safety   Stairs             Wheelchair Mobility     Tilt Bed    Modified Rankin (Stroke Patients Only)       Balance Overall balance assessment: Needs assistance Sitting-balance support: Feet supported, Bilateral upper extremity supported, Single extremity supported Sitting balance-Leahy Scale: Poor Sitting balance - Comments: initially max A, progressing to CGA with sustained mild posterior lean, worsens with perturbations. Postural control: Posterior lean, Left lateral lean Standing balance support: Bilateral upper extremity supported, During functional activity Standing balance-Leahy Scale: Zero Standing balance comment: dependent +2, reliant on external therapist support                            Communication Communication Communication: Impaired Factors Affecting Communication: Hearing impaired (Very HOH)  Cognition Arousal: Obtunded Behavior During Therapy: Flat affect   PT - Cognitive impairments: History of cognitive impairments  PT - Cognition Comments: Pt kept eyes closed throughout the majority of the session. He did not open his eyes to verbal command. Pt required continuous tactile stimulation to engage. Slightly improved engagement once seated up. No command following. Following commands: Impaired Following commands impaired: Follows one step commands inconsistently    Cueing Cueing Techniques: Verbal cues, Tactile cues  Exercises       General Comments General comments (skin integrity, edema, etc.): VSS      Pertinent Vitals/Pain Pain Assessment Pain Assessment: No/denies pain    Home Living                          Prior Function            PT Goals (current goals can now be found in the care plan section) Progress towards PT goals: Progressing toward goals    Frequency    Min 2X/week      PT Plan      Co-evaluation              AM-PAC PT 6 Clicks Mobility   Outcome Measure  Help needed turning from your back to your side while in a flat bed without using bedrails?: A Lot Help needed moving from lying on your back to sitting on the side of a flat bed without using bedrails?: A Lot Help needed moving to and from a bed to a chair (including a wheelchair)?: Total Help needed standing up from a chair using your arms (e.g., wheelchair or bedside chair)?: A Lot Help needed to walk in hospital room?: Total Help needed climbing 3-5 steps with a railing? : Total 6 Click Score: 9    End of Session Equipment Utilized During Treatment: Gait belt Activity Tolerance: Patient tolerated treatment well Patient left: in chair;with chair alarm set;with call bell/phone within reach Nurse Communication: Mobility status;Need for lift equipment PT Visit Diagnosis: Unsteadiness on feet (R26.81);Muscle weakness (generalized) (M62.81);Difficulty in walking, not elsewhere classified (R26.2);History of falling (Z91.81)     Time: 8868-8845 PT Time Calculation (min) (ACUTE ONLY): 23 min  Charges:    $Therapeutic Activity: 8-22 mins PT General Charges $$ ACUTE PT VISIT: 1 Visit                     Jacob Barnes, PT, DPT Acute Rehab Services [phone]    Jacob Barnes 04/15/2024, 1:35 PM

## 2024-04-15 NOTE — Plan of Care (Signed)
  Problem: Health Behavior/Discharge Planning: Goal: Ability to manage health-related needs will improve Outcome: Progressing   Problem: Clinical Measurements: Goal: Ability to maintain clinical measurements within normal limits will improve Outcome: Progressing   Problem: Nutrition: Goal: Adequate nutrition will be maintained Outcome: Progressing   Problem: Elimination: Goal: Will not experience complications related to bowel motility Outcome: Progressing

## 2024-04-15 NOTE — Progress Notes (Signed)
 [name] Renella, son would like to be point of contact of family to call for updates in regards to patient care and status updates. [phone])

## 2024-04-16 LAB — GLUCOSE, CAPILLARY
Glucose-Capillary: 99 mg/dL (ref 70–99)
Glucose-Capillary: 169 mg/dL — ABNORMAL HIGH (ref 70–99)
Glucose-Capillary: 113 mg/dL — ABNORMAL HIGH (ref 70–99)
Glucose-Capillary: 184 mg/dL — ABNORMAL HIGH (ref 70–99)

## 2024-04-16 LAB — BASIC METABOLIC PANEL WITH GFR
Anion gap: 7 (ref 5–15)
BUN: 27 mg/dL — ABNORMAL HIGH (ref 8–23)
CO2: 26 mmol/L (ref 22–32)
Calcium: 8.4 mg/dL — ABNORMAL LOW (ref 8.9–10.3)
Chloride: 98 mmol/L (ref 98–111)
Creatinine, Ser: 1.59 mg/dL — ABNORMAL HIGH (ref 0.61–1.24)
GFR, Estimated: 43 mL/min — ABNORMAL LOW
Glucose, Bld: 169 mg/dL — ABNORMAL HIGH (ref 70–99)
Potassium: 5.5 mmol/L — ABNORMAL HIGH (ref 3.5–5.1)
Sodium: 130 mmol/L — ABNORMAL LOW (ref 135–145)

## 2024-04-16 NOTE — TOC Progression Note (Signed)
 Transition of Care [facility]) - Progression Note    Patient Details  Name: Jacob Barnes MRN: [id number] Date of Birth: 1941-03-27  Transition of Care [facility]) CM/SW Contact  [name], [name] Phone Number: 04/16/2024, 1:14 PM  Clinical Narrative:   CSW contacted CMA to request initiation of insurance authorization. Authorization pending. CSW to follow.    Expected Discharge Plan: Skilled Nursing Facility Barriers to Discharge: Continued Medical Work up, English As A Second Language Teacher               Expected Discharge Plan and Services       Living arrangements for the past 2 months: Single Family Home                                       Social Drivers of Health (SDOH) Interventions SDOH Screenings   Food Insecurity: Food Insecurity Present (02/05/2024)  Housing: Low Risk (02/05/2024)  Transportation Needs: No Transportation Needs (02/05/2024)  Utilities: Not At Risk (02/05/2024)  Alcohol Screen: Low Risk (02/05/2024)  Depression (PHQ2-9): Low Risk (02/22/2024)  Financial Resource Strain: Patient Declined (02/05/2024)  Physical Activity: Inactive (02/05/2024)  Social Connections: Moderately Integrated (02/05/2024)  Stress: No Stress Concern Present (02/05/2024)  Tobacco Use: Low Risk (04/07/2024)  Health Literacy: Adequate Health Literacy (02/05/2024)    Readmission Risk Interventions     No data to display

## 2024-04-16 NOTE — TOC Progression Note (Signed)
 Transition of Care [facility]) - Progression Note    Patient Details  Name: Jacob Barnes MRN: [id number] Date of Birth: 21-Apr-1940  Transition of Care [facility]) CM/SW Contact  [name], [name] Phone Number: 04/16/2024, 1:15 PM  Clinical Narrative:   Patient received insurance approval for SNF: [id number], 04/16/24 - 04/22/24. CSW confirmed bed availability with [facility], but patient not medically stable at this time. CSW updated [facility]. CSW to follow.    Expected Discharge Plan: Skilled Nursing Facility Barriers to Discharge: Continued Medical Work up               Expected Discharge Plan and Services       Living arrangements for the past 2 months: Single Family Home                                       Social Drivers of Health (SDOH) Interventions SDOH Screenings   Food Insecurity: Food Insecurity Present (02/05/2024)  Housing: Low Risk (02/05/2024)  Transportation Needs: No Transportation Needs (02/05/2024)  Utilities: Not At Risk (02/05/2024)  Alcohol Screen: Low Risk (02/05/2024)  Depression (PHQ2-9): Low Risk (02/22/2024)  Financial Resource Strain: Patient Declined (02/05/2024)  Physical Activity: Inactive (02/05/2024)  Social Connections: Moderately Integrated (02/05/2024)  Stress: No Stress Concern Present (02/05/2024)  Tobacco Use: Low Risk (04/07/2024)  Health Literacy: Adequate Health Literacy (02/05/2024)    Readmission Risk Interventions     No data to display

## 2024-04-16 NOTE — Progress Notes (Signed)
 " PROGRESS NOTE    Jacob Barnes  FMW:[id number] DOB: 1940/07/03 DOA: 04/07/2024 PCP: Jacob Rollene BRAVO, MD  Brief Narrative:    84 y.o. M with dementia, lives at home, HTN, DM, malnutrition, CKD IV baseline 1.9-2.1 who presented with small intracranial hemorrhage.  This remains stable.  Hospital course is notable for poor intake, dehydration, altered mentation likely delirium.   Assessment & Plan:   Principal Problem:   Intraventricular hemorrhage (HCC) Active Problems:   Pressure injury of skin   Protein-calorie malnutrition, severe   Nontraumatic small subarachnoid hemorrhage Small intraventricular hemorrhage Small subdural hematoma Patient had had fall several days before Christmas.  Came to ER on Christmas for flank pain, found to have kidney stone.  Over next few days, family noticed he seemed more off, weaker, brought him to the ER. There, CT head 12/29 revealed small subdural hematoma, subarachnoid hemorrhage which remained stable on follow-up CT same day. - Head CT was repeated 1/4 due to increasing somnolence.  Shows decreasing intraventricular hemorrhage and unchanged mild hydrocephalus. - Neurology saw the patient earlier in the course and has signed off - 04/15/2024-More left usually.  No obvious medications.  No sedatives.  Repeat head CT to make sure he is not developing an increasing obstructive  hydrocephalus.   EEG unremarkable, echocardiogram showed severe LVH, but no embolic source LDL 11, hemoglobin A1c 8.3% - PT/OT - Avoid aspirin  - Continue Lipitor, Keppra    Acute encephalopathy/delirium: Exacerbated by brain bleed/dementia.,  dehydration.  Hypoactive/somnolent with periods of alertness -Will follow delirium precautions. - Hold Aricept . - Avoid deliriogenic medications. - Hydrate.  Continue NS at 50 cc/h with close monitoring of electrolytes and kidney function.   Suspected aspiration pneumonia Fever 1/2.  Not able to articulate any symptoms.  Denies cough,  chest pain, dysuria, suprapubic pain.  Given his poor prognosis, rather than get chest x-ray, I will treat aspiration empirically. - Cefadroxil  twice daily start 04/11/24 for 5 days     Severe protein calorie malnutrition - Continue feeding supplements - Recommend palliative care to follow at SNF   Chronic kidney disease stage IV Creatinine stable relative to baseline, suspect GFR significantly lower than predicted baseline creatinine given his severe muscle wasting.   Severe LVH Small pericardial effusion This is an incidental finding in the hospital on echocardiogram.  Cardiology were consulted, they recommended outpatient follow-up - Follow-up with cardiologist outpatient, pending goals of care    Cerebrovascular disease Hypertension Hyperlipidemia Blood pressure high normal - Continue amlodipine  - Continue Lipitor   History of seizures - Continue Keppra    Diabetes with hyperglycemia and chronic kidney disease Glucose mostly controlled in the last 24 hours - Continue sliding scale corrections - Hold home degludec/glargine, and Januvia    Anemia Thrombocytopenia Elevated serum free light chains Follows with Jacob Barnes at the [facility] cancer center.  He is not a candidate for chemotherapy, and so decision to biopsy his anemia, thrombocytopenia, and elevated free light chains has been deferred.  Platelets 61K on admission, transfused platelets, increased to 90K here.   Certainly may just be anemia of chronic kidney disease   Nephrolithiasis Kidney stone found week prior to admission, asymptomatic at present.  Microscopic hematuria on UA is from that, infection ruled out   Dysphagia: PLAN: Continue dysphagia 1/pureed diet with thin liquids.  Full supervision, assist with feeding due to inattention.  Rec SLP f/u at SNF to address potential for diet to be advanced.  He appears to be protecting airway, but dementia and new  neuro deficits are impacting initiative to eat and to eat  regular solid foods.  Trial of Remeron   DVT prophylaxis: Place and maintain sequential compression device Start: 04/08/24 0931  SCDs   Code Status: Limited: Do not attempt resuscitation (DNR) -DNR-LIMITED -Do Not Intubate/DNI  Family Communication: Discussed with his daughter, Jacob Barnes, [phone], who relays that she is the primary contact and caregiver.  Discussed that he is very somnolent.  No change in CT.  He is on Keppra  so we will send a level.  Darted on Remeron as he is really not eating much.  Concerned if we discharged him today he will dehydrate over the next several days and be back in the hospital.  [name] clearly states  her father would not want any sort of feeding tubes. Disposition Plan: SNF Reason for continuing need for hospitalization: lethargy, r/o significant obstructing hydrocephalus  Objective: Vitals:   04/15/24 2350 04/15/24 2351 04/16/24 0330 04/16/24 0901  BP:  (!) 109/56 112/69 115/79  Pulse: 89  88 81  Resp:  12 16 15   Temp: 97.9 F (36.6 C)  98 F (36.7 C) 98 F (36.7 C)  TempSrc: Axillary  Oral Axillary  SpO2: 99%  100% 100%  Weight:      Height:        Intake/Output Summary (Last 24 hours) at 04/16/2024 0911 Last data filed at 04/15/2024 1740 Gross per 24 hour  Intake 1364.06 ml  Output 725 ml  Net 639.06 ml   Filed Weights   04/07/24 0949 04/07/24 2020 04/15/24 1140  Weight: 42.3 kg 39.2 kg 39.6 kg    Examination:  He remains lethargic with his eyes closed.  However he will follow simple commands.  He took his medications but did not eat much.  Regular rate and rhythm no murmurs rubs gallops Lungs were clear throughout with poor effort Abdomen soft Extremities without edema  Data Reviewed: I have personally reviewed following labs and imaging studies  CBC: Recent Labs  Lab 04/10/24 0207 04/11/24 0304 04/12/24 0450  WBC 9.2 13.7* 8.0  HGB 10.7* 10.5* 10.7*  HCT 32.0* 31.1* 31.7*  MCV 90.4 90.1 90.1  PLT 90* 98* 88*    Basic Metabolic Panel: Recent Labs  Lab 04/11/24 0304 04/12/24 0450 04/13/24 0642  NA 135 133* 131*  K 3.8 4.7 4.9  CL 102 100 101  CO2 24 22 20*  GLUCOSE 75 279* 182*  BUN 36* 37* 39*  CREATININE 1.94* 1.84* 1.97*  CALCIUM  8.1* 8.1* 8.2*   GFR: Estimated Creatinine Clearance: 15.6 mL/min (A) (by C-G formula based on SCr of 1.97 mg/dL (H)). Liver Function Tests: Recent Labs  Lab 04/11/24 0304  AST 45*  ALT 35  ALKPHOS 57  BILITOT 0.6  PROT 7.1  ALBUMIN 3.4*   No results for input(s): LIPASE, AMYLASE in the last 168 hours. No results for input(s): AMMONIA in the last 168 hours. Coagulation Profile: No results for input(s): INR, PROTIME in the last 168 hours. Cardiac Enzymes: No results for input(s): CKTOTAL, CKMB, CKMBINDEX, TROPONINI in the last 168 hours. ProBNP, BNP (last 5 results) No results for input(s): PROBNP, BNP in the last 8760 hours. HbA1C: No results for input(s): HGBA1C in the last 72 hours. CBG: Recent Labs  Lab 04/15/24 0609 04/15/24 1133 04/15/24 1715 04/15/24 2137 04/16/24 0637  GLUCAP 152* 199* 229* 209* 99   Lipid Profile: No results for input(s): CHOL, HDL, LDLCALC, TRIG, CHOLHDL, LDLDIRECT in the last 72 hours. Thyroid Function Tests: No results  for input(s): TSH, T4TOTAL, FREET4, T3FREE, THYROIDAB in the last 72 hours. Anemia Panel: No results for input(s): VITAMINB12, FOLATE, FERRITIN, TIBC, IRON , RETICCTPCT in the last 72 hours. Sepsis Labs: No results for input(s): PROCALCITON, LATICACIDVEN in the last 168 hours.  Recent Results (from the past 240 hours)  MRSA Next Gen by PCR, Nasal     Status: Abnormal   Collection Time: 04/07/24  8:25 PM   Specimen: Nasal Mucosa; Nasal Swab  Result Value Ref Range Status   MRSA by PCR Next Gen DETECTED (A) NOT DETECTED Final    Comment: CRITICAL RESULTS CALLED TO, READ BACK BY AND VERIFIED WITH: RN J.TIRRELL ON 04/08/24 AT 0052  BY NM (NOTE) The GeneXpert MRSA Assay (FDA approved for NASAL specimens only), is one component of a comprehensive MRSA colonization surveillance program. It is not intended to diagnose MRSA infection nor to guide or monitor treatment for MRSA infections. Test performance is not FDA approved in patients less than 81 years old. Performed at [facility], [address] [place], [name] [address]      Radiology Studies: CT HEAD WO CONTRAST ( ) Result Date: 04/15/2024 EXAM: CT HEAD WITHOUT CONTRAST 04/15/2024 02:44:23 PM TECHNIQUE: CT of the head was performed without the administration of intravenous contrast. Automated exposure control, iterative reconstruction, and/or weight based adjustment of the mA/kV was utilized to reduce the radiation dose to as low as reasonably achievable. COMPARISON: Head CT 04/13/2024 and MRI 04/08/2024. CLINICAL HISTORY: Hydrocephalus; ICH with intraventicular blood, more lethargic, progressive hydrocephalus? FINDINGS: BRAIN AND VENTRICLES: There is small volume residual hemorrhage in the lateral ventricles, decreased from prior. Mild ventriculomegaly is unchanged from the prior CT. No definite acute large territory infarct is identified, however motion, streak artifact, and image noise reduce the sensitivity for detection of early infarct. No new intracranial hemorrhage, mass, midline shift, or extra-axial fluid collection is identified. Cerebral atrophy and chronic small vessel ischemic white matter disease are again noted. Calcified atherosclerosis at the skull base. Prior left middle meningeal artery embolization. ORBITS: Bilateral cataract extraction. SINUSES: No acute abnormality. SOFT TISSUES AND SKULL: No acute soft tissue abnormality. No skull fracture. IMPRESSION: 1. Decreasing small volume residual intraventricular hemorrhage. Unchanged moderate hydrocephalus. 2. No definite new intracranial abnormality. Electronically signed by: Dasie Hamburg MD 04/15/2024  03:19 PM EST RP Workstation: [id number]    Scheduled Meds:  amLODipine   2.5 mg Oral Daily   atorvastatin   10 mg Oral Daily   Chlorhexidine  Gluconate Cloth  6 each Topical Daily   dorzolamide -timolol   1 drop Both Eyes BID   feeding supplement  237 mL Oral BID BM   insulin  aspart  0-15 Units Subcutaneous TID WC   insulin  aspart  0-5 Units Subcutaneous QHS   latanoprost   1 drop Both Eyes QHS   levETIRAcetam   500 mg Oral BID   multivitamin with minerals  1 tablet Oral Daily   mupirocin  ointment   Nasal BID   mouth rinse  15 mL Mouth Rinse 4 times per day   tamsulosin   0.4 mg Oral QPC supper   Continuous Infusions:     LOS: 9 days   Time spent: 40 minutes  Lonni KANDICE Moose, MD  [facility]  04/16/2024, 9:11 AM   "

## 2024-04-17 LAB — CBC WITH DIFFERENTIAL/PLATELET
Abs Immature Granulocytes: 0.04 K/uL (ref 0.00–0.07)
Basophils Absolute: 0 K/uL (ref 0.0–0.1)
Basophils Relative: 0 %
Eosinophils Absolute: 0 K/uL (ref 0.0–0.5)
Eosinophils Relative: 0 %
HCT: 40.3 % (ref 39.0–52.0)
Hemoglobin: 13.5 g/dL (ref 13.0–17.0)
Immature Granulocytes: 0 %
Lymphocytes Relative: 18 %
Lymphs Abs: 1.6 K/uL (ref 0.7–4.0)
MCH: 30.3 pg (ref 26.0–34.0)
MCHC: 33.5 g/dL (ref 30.0–36.0)
MCV: 90.4 fL (ref 80.0–100.0)
Monocytes Absolute: 2.1 K/uL — ABNORMAL HIGH (ref 0.1–1.0)
Monocytes Relative: 23 %
Neutro Abs: 5.3 K/uL (ref 1.7–7.7)
Neutrophils Relative %: 59 %
Platelets: 120 K/uL — ABNORMAL LOW (ref 150–400)
RBC: 4.46 MIL/uL (ref 4.22–5.81)
RDW: 14.8 % (ref 11.5–15.5)
WBC: 9.1 K/uL (ref 4.0–10.5)
nRBC: 0 % (ref 0.0–0.2)

## 2024-04-17 LAB — COMPREHENSIVE METABOLIC PANEL WITH GFR
ALT: 46 U/L — ABNORMAL HIGH (ref 0–44)
AST: 52 U/L — ABNORMAL HIGH (ref 15–41)
Albumin: 4.1 g/dL (ref 3.5–5.0)
Alkaline Phosphatase: 67 U/L (ref 38–126)
Anion gap: 8 (ref 5–15)
BUN: 29 mg/dL — ABNORMAL HIGH (ref 8–23)
CO2: 28 mmol/L (ref 22–32)
Calcium: 9.1 mg/dL (ref 8.9–10.3)
Chloride: 96 mmol/L — ABNORMAL LOW (ref 98–111)
Creatinine, Ser: 1.55 mg/dL — ABNORMAL HIGH (ref 0.61–1.24)
GFR, Estimated: 44 mL/min — ABNORMAL LOW
Glucose, Bld: 148 mg/dL — ABNORMAL HIGH (ref 70–99)
Potassium: 4.6 mmol/L (ref 3.5–5.1)
Sodium: 132 mmol/L — ABNORMAL LOW (ref 135–145)
Total Bilirubin: 0.8 mg/dL (ref 0.0–1.2)
Total Protein: 8.7 g/dL — ABNORMAL HIGH (ref 6.5–8.1)

## 2024-04-17 LAB — GLUCOSE, CAPILLARY
Glucose-Capillary: 178 mg/dL — ABNORMAL HIGH (ref 70–99)
Glucose-Capillary: 181 mg/dL — ABNORMAL HIGH (ref 70–99)
Glucose-Capillary: 246 mg/dL — ABNORMAL HIGH (ref 70–99)
Glucose-Capillary: 132 mg/dL — ABNORMAL HIGH (ref 70–99)

## 2024-04-17 MED ORDER — MODAFINIL 100 MG PO TABS
100.0000 mg | ORAL_TABLET | Freq: Every day | ORAL | Status: DC
  Administered 2024-04-17 – 2024-04-21 (×5): 100 mg via ORAL
  Filled 2024-04-17 (×5): qty 1

## 2024-04-17 NOTE — Plan of Care (Signed)
  Problem: Education: Goal: Knowledge of General Education information will improve Description: Including pain rating scale, medication(s)/side effects and non-pharmacologic comfort measures Outcome: Progressing   Problem: Health Behavior/Discharge Planning: Goal: Ability to manage health-related needs will improve Outcome: Progressing   Problem: Clinical Measurements: Goal: Ability to maintain clinical measurements within normal limits will improve Outcome: Progressing Goal: Will remain free from infection Outcome: Progressing Goal: Diagnostic test results will improve Outcome: Progressing Goal: Respiratory complications will improve Outcome: Progressing Goal: Cardiovascular complication will be avoided Outcome: Progressing   Problem: Activity: Goal: Risk for activity intolerance will decrease Outcome: Progressing   Problem: Nutrition: Goal: Adequate nutrition will be maintained Outcome: Progressing   Problem: Coping: Goal: Level of anxiety will decrease Outcome: Progressing   Problem: Elimination: Goal: Will not experience complications related to bowel motility Outcome: Progressing Goal: Will not experience complications related to urinary retention Outcome: Progressing   Problem: Pain Managment: Goal: General experience of comfort will improve and/or be controlled Outcome: Progressing   Problem: Safety: Goal: Ability to remain free from injury will improve Outcome: Progressing   Problem: Skin Integrity: Goal: Risk for impaired skin integrity will decrease Outcome: Progressing   Problem: Education: Goal: Knowledge of disease or condition will improve Outcome: Progressing Goal: Knowledge of secondary prevention will improve (MUST DOCUMENT ALL) Outcome: Progressing Goal: Knowledge of patient specific risk factors will improve (DELETE if not current risk factor) Outcome: Progressing   Problem: Intracerebral Hemorrhage Tissue Perfusion: Goal: Complications  of Intracerebral Hemorrhage will be minimized Outcome: Progressing   Problem: Coping: Goal: Will verbalize positive feelings about self Outcome: Progressing Goal: Will identify appropriate support needs Outcome: Progressing   Problem: Health Behavior/Discharge Planning: Goal: Ability to manage health-related needs will improve Outcome: Progressing Goal: Goals will be collaboratively established with patient/family Outcome: Progressing   Problem: Self-Care: Goal: Ability to participate in self-care as condition permits will improve Outcome: Progressing Goal: Verbalization of feelings and concerns over difficulty with self-care will improve Outcome: Progressing Goal: Ability to communicate needs accurately will improve Outcome: Progressing   Problem: Nutrition: Goal: Risk of aspiration will decrease Outcome: Progressing Goal: Dietary intake will improve Outcome: Progressing   Problem: Education: Goal: Ability to describe self-care measures that may prevent or decrease complications (Diabetes Survival Skills Education) will improve Outcome: Progressing Goal: Individualized Educational Video(s) Outcome: Progressing   Problem: Coping: Goal: Ability to adjust to condition or change in health will improve Outcome: Progressing   Problem: Fluid Volume: Goal: Ability to maintain a balanced intake and output will improve Outcome: Progressing   Problem: Health Behavior/Discharge Planning: Goal: Ability to identify and utilize available resources and services will improve Outcome: Progressing Goal: Ability to manage health-related needs will improve Outcome: Progressing   Problem: Metabolic: Goal: Ability to maintain appropriate glucose levels will improve Outcome: Progressing   Problem: Nutritional: Goal: Maintenance of adequate nutrition will improve Outcome: Progressing Goal: Progress toward achieving an optimal weight will improve Outcome: Progressing   Problem: Skin  Integrity: Goal: Risk for impaired skin integrity will decrease Outcome: Progressing   Problem: Tissue Perfusion: Goal: Adequacy of tissue perfusion will improve Outcome: Progressing

## 2024-04-17 NOTE — TOC Progression Note (Signed)
 Transition of Care [facility]) - Progression Note    Patient Details  Name: Jacob Barnes MRN: [id number] Date of Birth: 1941/02/06  Transition [facility]) CM/SW Contact  Gwenn Frieze [place], [name] Phone Number: 04/17/2024, 2:15 PM  Clinical Narrative: Per MD, pt not medically stable for dc to SNF today. Updated Debbie at [facility]. Pt's shara is valid through 1/13.   Frieze Gwenn, MSW, LCSW [phone] (coverage)        Expected Discharge Plan: Skilled Nursing Facility Barriers to Discharge: Continued Medical Work up               Expected Discharge Plan and Services       Living arrangements for the past 2 months: Single Family Home                                       Social Drivers of Health (SDOH) Interventions SDOH Screenings   Food Insecurity: Food Insecurity Present (02/05/2024)  Housing: Low Risk (02/05/2024)  Transportation Needs: No Transportation Needs (02/05/2024)  Utilities: Not At Risk (02/05/2024)  Alcohol Screen: Low Risk (02/05/2024)  Depression (PHQ2-9): Low Risk (02/22/2024)  Financial Resource Strain: Patient Declined (02/05/2024)  Physical Activity: Inactive (02/05/2024)  Social Connections: Moderately Integrated (02/05/2024)  Stress: No Stress Concern Present (02/05/2024)  Tobacco Use: Low Risk (04/07/2024)  Health Literacy: Adequate Health Literacy (02/05/2024)    Readmission Risk Interventions     No data to display

## 2024-04-17 NOTE — Progress Notes (Signed)
 Occupational Therapy Treatment Patient Details Name: Jacob Barnes MRN: [id number] DOB: 11/17/1940 Today's Date: 04/17/2024   History of present illness Jacob Barnes is a 84 y.o. male who is brought in to the ED with confusion and generalized weakness. CT head demonstrated IVH centered in the L choroid plexus with layering hemorrhage in BL occipital lobes. PMH: dementia, HTN, HLD, GERD, DM2, osteoporosis, anemia, thrombocytopenia, prior L SDH, retinopathy   OT comments  Patient demonstrating gains with OT treatment. Patient more alert this treatment but continues to require mod assist for bed mobility. Patient was mod assist of one for sit to stand and transfer to recliner due to posterior leaning. Patient improving with following directions and participation with self care tasks.  Patient will benefit from continued inpatient follow up therapy, <3 hours/day.  Acute OT to continue to follow to address established goals to facilitate DC to next venue of care.        If plan is discharge home, recommend the following:  Two people to help with walking and/or transfers;Assistance with cooking/housework;Assist for transportation;Help with stairs or ramp for entrance;Direct supervision/assist for financial management;Direct supervision/assist for medications management;Assistance with feeding;Supervision due to cognitive status;A lot of help with bathing/dressing/bathroom   Equipment Recommendations  Other (comment) (defer)    Recommendations for Other Services      Precautions / Restrictions Precautions Precautions: Fall Recall of Precautions/Restrictions: Impaired Precaution/Restrictions Comments: monitor for orthostatics Restrictions Weight Bearing Restrictions Per Provider Order: No       Mobility Bed Mobility Overal bed mobility: Needs Assistance Bed Mobility: Supine to Sit     Supine to sit: Mod assist     General bed mobility comments: increased patient participation with mod  assist with trunk and scooting to EOB    Transfers Overall transfer level: Needs assistance Equipment used: Rolling walker (2 wheels) Transfers: Sit to/from Stand, Bed to chair/wheelchair/BSC Sit to Stand: Mod assist     Step pivot transfers: Mod assist     General transfer comment: cues for hand placement and mod assist to power up and for balance with transfer due to posterior leaning     Balance Overall balance assessment: Needs assistance Sitting-balance support: Feet supported, Bilateral upper extremity supported, Single extremity supported Sitting balance-Leahy Scale: Poor Sitting balance - Comments: CGA on EOB due to posterior leaning Postural control: Posterior lean Standing balance support: Bilateral upper extremity supported, During functional activity Standing balance-Leahy Scale: Poor Standing balance comment: reliant on RW for support                           ADL either performed or assessed with clinical judgement   ADL Overall ADL's : Needs assistance/impaired     Grooming: Wash/dry hands;Wash/dry face;Oral care;Minimal assistance;Sitting Grooming Details (indicate cue type and reason): cues to initiate, min assist with toothpaste             Lower Body Dressing: Maximal assistance;Sitting/lateral leans Lower Body Dressing Details (indicate cue type and reason): to donn socks                    Extremity/Trunk Assessment              Vision       Perception     Praxis     Communication Communication Communication: Impaired Factors Affecting Communication: Hearing impaired (very HOH)   Cognition Arousal: Alert Behavior During Therapy: WFL for tasks assessed/performed Cognition: History of cognitive  impairments             OT - Cognition Comments: history of dementia, pleasant                 Following commands: Impaired Following commands impaired: Follows one step commands with increased time, Follows  one step commands inconsistently      Cueing   Cueing Techniques: Verbal cues, Tactile cues  Exercises      Shoulder Instructions       General Comments BP 92/72 up in recliner    Pertinent Vitals/ Pain       Pain Assessment Pain Assessment: No/denies pain  Home Living                                          Prior Functioning/Environment              Frequency  Min 2X/week        Progress Toward Goals  OT Goals(current goals can now be found in the care plan section)  Progress towards OT goals: Progressing toward goals  Acute Rehab OT Goals Patient Stated Goal: none stated OT Goal Formulation: Patient unable to participate in goal setting Time For Goal Achievement: 04/22/24 Potential to Achieve Goals: Good ADL Goals Pt Will Perform Grooming: with min assist;sitting Pt Will Perform Upper Body Bathing: with min assist;sitting Pt Will Transfer to Toilet: with min assist;bedside commode;stand pivot transfer Additional ADL Goal #1: Pt will complete bed mobility with mod assist and maintain sitting balance during ADls for 5 minutes with supervision.  Plan      Co-evaluation                 AM-PAC OT 6 Clicks Daily Activity     Outcome Measure   Help from another person eating meals?: A Little Help from another person taking care of personal grooming?: A Lot Help from another person toileting, which includes using toliet, bedpan, or urinal?: A Lot Help from another person bathing (including washing, rinsing, drying)?: A Lot Help from another person to put on and taking off regular upper body clothing?: A Lot Help from another person to put on and taking off regular lower body clothing?: A Lot 6 Click Score: 13    End of Session Equipment Utilized During Treatment: Gait belt;Rolling walker (2 wheels)  OT Visit Diagnosis: Other abnormalities of gait and mobility (R26.89);Muscle weakness (generalized) (M62.81);Other symptoms and  signs involving the nervous system (R29.898)   Activity Tolerance Patient tolerated treatment well   Patient Left in chair;with call bell/phone within reach;with chair alarm set   Nurse Communication Mobility status        Time: 9269-9243 OT Time Calculation (min): 26 min  Charges: OT General Charges $OT Visit: 1 Visit OT Treatments $Self Care/Home Management : 8-22 mins $Therapeutic Activity: 8-22 mins  Dick Barnes, OTA Acute Rehabilitation Services  Office [phone]   Jacob Barnes 04/17/2024, 11:01 AM

## 2024-04-17 NOTE — Progress Notes (Signed)
 " PROGRESS NOTE    Jacob Barnes  FMW:[id number] DOB: 1941/03/22 DOA: 04/07/2024 PCP: Jacob Barnes  Brief Narrative:    84 y.o. M with dementia, lives at home, HTN, DM, malnutrition, CKD IV baseline 1.9-2.1 who presented with small intracranial hemorrhage.  This remains stable.  Hospital course is notable for poor intake, dehydration, altered mentation likely delirium.   Assessment & Plan:   Principal Problem:   Intraventricular hemorrhage (HCC) Active Problems:   Pressure injury of skin   Protein-calorie malnutrition, severe   Nontraumatic small subarachnoid hemorrhage Small intraventricular hemorrhage Small subdural hematoma Patient had had fall several days before Christmas.  Came to ER on Christmas for flank pain, found to have kidney stone.  Over next few days, family noticed he seemed more off, weaker, brought him to the ER. There, CT head 12/29 revealed small subdural hematoma, subarachnoid hemorrhage which remained stable on follow-up CT same day. - Head CT was repeated 1/4 due to increasing somnolence.  Shows decreasing intraventricular hemorrhage and unchanged mild hydrocephalus. - Neurology saw the patient earlier in the course and has signed off - 04/15/2024-More left usually.  No obvious medications.  No sedatives.  Repeat head CT to make sure he is not developing an increasing obstructive  hydrocephalus. 04/17/24-patient is slightly more alert today but really for the most part he somnolent all day.  Per nursing he did eat and take his medications.  I discussed with neurology we will try modafinil  to see if it makes a difference in his alertness during the day.  I am concerned about sending him to rehab and him not being alert enough to participate in to eat.   EEG unremarkable, echocardiogram showed severe LVH, but no embolic source LDL 11, hemoglobin A1c 8.3% - PT/OT - Avoid aspirin  - Continue Lipitor, Keppra    Acute encephalopathy/delirium: Exacerbated by brain  bleed/dementia.,  dehydration.  Hypoactive/somnolent with periods of alertness -Will follow delirium precautions. - Hold Aricept . - Avoid deliriogenic medications. - Hydrate.  Continue NS at 50 cc/h with close monitoring of electrolytes and kidney function.   Suspected aspiration pneumonia Fever 1/2.  Not able to articulate any symptoms.  Denies cough, chest pain, dysuria, suprapubic pain.  Given his poor prognosis, rather than get chest x-ray, I will treat aspiration empirically. - Cefadroxil  twice daily start 04/11/24 for 5 days     Severe protein calorie malnutrition - Continue feeding supplements - Recommend palliative care to follow at SNF - trial of remeron to stimulate appetite.    Chronic kidney disease stage IV Creatinine stable relative to baseline, suspect GFR significantly lower than predicted baseline creatinine given his severe muscle wasting.   Severe LVH Small pericardial effusion This is an incidental finding in the hospital on echocardiogram.  Cardiology were consulted, they recommended outpatient follow-up - Follow-up with cardiologist outpatient, pending goals of care    Cerebrovascular disease Hypertension Hyperlipidemia Blood pressure high normal - Continue amlodipine  - Continue Lipitor   History of seizures - Continue Keppra    Diabetes with hyperglycemia and chronic kidney disease Glucose mostly controlled in the last 24 hours - Continue sliding scale corrections - Hold home degludec/glargine, and Januvia    Anemia Thrombocytopenia Elevated serum free light chains Follows with Dr. Davonna at the [facility] cancer center.  He is not a candidate for chemotherapy, and so decision to biopsy his anemia, thrombocytopenia, and elevated free light chains has been deferred.  Platelets 61K on admission, transfused platelets, increased to 90K here.   Certainly may  just be anemia of chronic kidney disease   Nephrolithiasis Kidney stone found week prior to  admission, asymptomatic at present.  Microscopic hematuria on UA is from that, infection ruled out   Dysphagia: PLAN: Continue dysphagia 1/pureed diet with thin liquids.  Full supervision, assist with feeding due to inattention.  Rec SLP f/u at SNF to address potential for diet to be advanced.  He appears to be protecting airway, but dementia and new neuro deficits are impacting initiative to eat and to eat regular solid foods.  Trial of Remeron   DVT prophylaxis: Place and maintain sequential compression device Start: 04/08/24 0931  SCDs   Code Status: Limited: Do not attempt resuscitation (DNR) -DNR-LIMITED -Do Not Intubate/DNI  Family Communication: Discussed with his daughter, Jacob Barnes, [phone], who relays that she is the primary contact and caregiver.  Discussed that he is very somnolent.  No change in CT.  He is on Keppra  so we will send a level.  Darted on Remeron as he is really not eating much.  Concerned if we discharged him today he will dehydrate over the next several days and be back in the hospital.  [name] clearly states  her father would not want any sort of feeding tubes. Disposition Plan: SNF Reason for continuing need for hospitalization: lethargy, trial of modafinil   Objective: Vitals:   04/16/24 1929 04/16/24 2312 04/17/24 0304 04/17/24 0808  BP: (!) 141/88 (!) 152/84 (!) 132/90 106/77  Pulse: 72 77 72 90  Resp:    16  Temp: 97.6 F (36.4 C) 98 F (36.7 C) 98 F (36.7 C) 98.1 F (36.7 C)  TempSrc: Oral Oral Oral   SpO2: 98% 99% 98%   Weight:      Height:        Intake/Output Summary (Last 24 hours) at 04/17/2024 0822 Last data filed at 04/17/2024 0527 Gross per 24 hour  Intake 220 ml  Output 1500 ml  Net -1280 ml   Filed Weights   04/07/24 0949 04/07/24 2020 04/15/24 1140  Weight: 42.3 kg 39.2 kg 39.6 kg    Examination:  He remains lethargic with his eyes closed.  However he will follow simple commands.  He took his medications but did not eat  much.  Regular rate and rhythm no murmurs rubs gallops Lungs were clear throughout with poor effort Abdomen soft Extremities without edema  Data Reviewed: I have personally reviewed following labs and imaging studies  CBC: Recent Labs  Lab 04/11/24 0304 04/12/24 0450 04/17/24 0245  WBC 13.7* 8.0 9.1  NEUTROABS  --   --  5.3  HGB 10.5* 10.7* 13.5  HCT 31.1* 31.7* 40.3  MCV 90.1 90.1 90.4  PLT 98* 88* 120*   Basic Metabolic Panel: Recent Labs  Lab 04/11/24 0304 04/12/24 0450 04/13/24 0642 04/16/24 1359 04/17/24 0245  NA 135 133* 131* 130* 132*  K 3.8 4.7 4.9 5.5* 4.6  CL 102 100 101 98 96*  CO2 24 22 20* 26 28  GLUCOSE 75 279* 182* 169* 148*  BUN 36* 37* 39* 27* 29*  CREATININE 1.94* 1.84* 1.97* 1.59* 1.55*  CALCIUM  8.1* 8.1* 8.2* 8.4* 9.1   GFR: Estimated Creatinine Clearance: 19.9 mL/min (A) (by C-G formula based on SCr of 1.55 mg/dL (H)). Liver Function Tests: Recent Labs  Lab 04/11/24 0304 04/17/24 0245  AST 45* 52*  ALT 35 46*  ALKPHOS 57 67  BILITOT 0.6 0.8  PROT 7.1 8.7*  ALBUMIN 3.4* 4.1   No results for input(s): LIPASE,  AMYLASE in the last 168 hours. No results for input(s): AMMONIA in the last 168 hours. Coagulation Profile: No results for input(s): INR, PROTIME in the last 168 hours. Cardiac Enzymes: No results for input(s): CKTOTAL, CKMB, CKMBINDEX, TROPONINI in the last 168 hours. ProBNP, BNP (last 5 results) No results for input(s): PROBNP, BNP in the last 8760 hours. HbA1C: No results for input(s): HGBA1C in the last 72 hours. CBG: Recent Labs  Lab 04/16/24 0637 04/16/24 1157 04/16/24 1709 04/16/24 2109 04/17/24 0615  GLUCAP 99 169* 113* 184* 178*   Lipid Profile: No results for input(s): CHOL, HDL, LDLCALC, TRIG, CHOLHDL, LDLDIRECT in the last 72 hours. Thyroid Function Tests: No results for input(s): TSH, T4TOTAL, FREET4, T3FREE, THYROIDAB in the last 72 hours. Anemia Panel: No  results for input(s): VITAMINB12, FOLATE, FERRITIN, TIBC, IRON , RETICCTPCT in the last 72 hours. Sepsis Labs: No results for input(s): PROCALCITON, LATICACIDVEN in the last 168 hours.  Recent Results (from the past 240 hours)  MRSA Next Gen by PCR, Nasal     Status: Abnormal   Collection Time: 04/07/24  8:25 PM   Specimen: Nasal Mucosa; Nasal Swab  Result Value Ref Range Status   MRSA by PCR Next Gen DETECTED (A) NOT DETECTED Final    Comment: CRITICAL RESULTS CALLED TO, READ BACK BY AND VERIFIED WITH: RN J.TIRRELL ON 04/08/24 AT 0052 BY NM (NOTE) The GeneXpert MRSA Assay (FDA approved for NASAL specimens only), is one component of a comprehensive MRSA colonization surveillance program. It is not intended to diagnose MRSA infection nor to guide or monitor treatment for MRSA infections. Test performance is not FDA approved in patients less than 23 years old. Performed at [facility], [address] [place], [name] [address]      Radiology Studies: CT HEAD WO CONTRAST ( ) Result Date: 04/15/2024 EXAM: CT HEAD WITHOUT CONTRAST 04/15/2024 02:44:23 PM TECHNIQUE: CT of the head was performed without the administration of intravenous contrast. Automated exposure control, iterative reconstruction, and/or weight based adjustment of the mA/kV was utilized to reduce the radiation dose to as low as reasonably achievable. COMPARISON: Head CT 04/13/2024 and MRI 04/08/2024. CLINICAL HISTORY: Hydrocephalus; ICH with intraventicular blood, more lethargic, progressive hydrocephalus? FINDINGS: BRAIN AND VENTRICLES: There is small volume residual hemorrhage in the lateral ventricles, decreased from prior. Mild ventriculomegaly is unchanged from the prior CT. No definite acute large territory infarct is identified, however motion, streak artifact, and image noise reduce the sensitivity for detection of early infarct. No new intracranial hemorrhage, mass, midline shift, or extra-axial  fluid collection is identified. Cerebral atrophy and chronic small vessel ischemic white matter disease are again noted. Calcified atherosclerosis at the skull base. Prior left middle meningeal artery embolization. ORBITS: Bilateral cataract extraction. SINUSES: No acute abnormality. SOFT TISSUES AND SKULL: No acute soft tissue abnormality. No skull fracture. IMPRESSION: 1. Decreasing small volume residual intraventricular hemorrhage. Unchanged moderate hydrocephalus. 2. No definite new intracranial abnormality. Electronically signed by: Dasie Hamburg Barnes 04/15/2024 03:19 PM EST RP Workstation: [id number]    Scheduled Meds:  amLODipine   2.5 mg Oral Daily   atorvastatin   10 mg Oral Daily   Chlorhexidine  Gluconate Cloth  6 each Topical Daily   dorzolamide -timolol   1 drop Both Eyes BID   feeding supplement  237 mL Oral BID BM   insulin  aspart  0-15 Units Subcutaneous TID WC   insulin  aspart  0-5 Units Subcutaneous QHS   latanoprost   1 drop Both Eyes QHS   levETIRAcetam   500 mg Oral  BID   multivitamin with minerals  1 tablet Oral Daily   mupirocin  ointment   Nasal BID   mouth rinse  15 mL Mouth Rinse 4 times per day   tamsulosin   0.4 mg Oral QPC supper   Continuous Infusions:     LOS: 10 days   Time spent: 40 minutes  Lonni KANDICE Moose, Barnes  [facility]  04/17/2024, 8:22 AM   "

## 2024-04-18 LAB — GLUCOSE, CAPILLARY
Glucose-Capillary: 208 mg/dL — ABNORMAL HIGH (ref 70–99)
Glucose-Capillary: 84 mg/dL (ref 70–99)
Glucose-Capillary: 165 mg/dL — ABNORMAL HIGH (ref 70–99)
Glucose-Capillary: 191 mg/dL — ABNORMAL HIGH (ref 70–99)
Glucose-Capillary: 165 mg/dL — ABNORMAL HIGH (ref 70–99)

## 2024-04-18 NOTE — Progress Notes (Signed)
 Physical Therapy Treatment Patient Details Name: Jacob Barnes MRN: [id number] DOB: [birth year] Today's Date: 04/18/2024   History of Present Illness Jacob Barnes is a 84 y.o. male who is brought in to the ED with confusion and generalized weakness. CT head demonstrated IVH centered in the L choroid plexus with layering hemorrhage in BL occipital lobes. PMH: dementia, HTN, HLD, GERD, DM2, osteoporosis, anemia, thrombocytopenia, prior L SDH, retinopathy    PT Comments  Pt with fair tolerance to treatment today. Pt noted to be very lethargic keeping eyes closed for majority of the session unless verbally or tactually stimulated. +2 Mod A to stand however pt noted with very heavy posterior lean. Initially trialed RW however pt would not grab RW so opted for HHA instead. +2 Max A to step pivot to chair due to heavy posterior lean. No change in DC/DME recs at this time. PT will continue to follow.      If plan is discharge home, recommend the following: Assist for transportation;Help with stairs or ramp for entrance;Supervision due to cognitive status;Two people to help with walking and/or transfers;Two people to help with bathing/dressing/bathroom;Assistance with cooking/housework   Can travel by private vehicle     No  Equipment Recommendations  Hospital bed;Hoyer lift;Wheelchair (measurements PT);Wheelchair cushion (measurements PT)    Recommendations for Other Services       Precautions / Restrictions Precautions Precautions: Fall Recall of Precautions/Restrictions: Impaired Precaution/Restrictions Comments: monitor for orthostatics Restrictions Weight Bearing Restrictions Per Provider Order: No     Mobility  Bed Mobility Overal bed mobility: Needs Assistance Bed Mobility: Supine to Sit     Supine to sit: Mod assist     General bed mobility comments: Pt with very little initiation however once therapist moved pt legs then he became more responsive.    Transfers Overall transfer  level: Needs assistance Equipment used: 2 person hand held assist Transfers: Sit to/from Stand, Bed to chair/wheelchair/BSC Sit to Stand: +2 physical assistance, Mod assist   Step pivot transfers: +2 physical assistance, Max assist       General transfer comment: +2 Mod A to stand however pt noted with very heavy posterior lean. Initially trialed RW however pt would not grab RW so opted for HHA instead. +2 Max A to step pivot to chair due to heavy posterior lean.    Ambulation/Gait               General Gait Details: Deferred for safety   Stairs             Wheelchair Mobility     Tilt Bed    Modified Rankin (Stroke Patients Only) Modified Rankin (Stroke Patients Only) Pre-Morbid Rankin Score: Moderate disability Modified Rankin: Severe disability     Balance Overall balance assessment: Needs assistance Sitting-balance support: Feet supported, Bilateral upper extremity supported, Single extremity supported Sitting balance-Leahy Scale: Poor Sitting balance - Comments: CGA on EOB due to posterior leaning Postural control: Posterior lean Standing balance support: Bilateral upper extremity supported, During functional activity Standing balance-Leahy Scale: Zero Standing balance comment: Reliant on therapist                            Communication Communication Communication: Impaired Factors Affecting Communication: Hearing impaired (Extremely HOH)  Cognition Arousal: Lethargic Behavior During Therapy: Flat affect   PT - Cognitive impairments: History of cognitive impairments  PT - Cognition Comments: Pt kept eyes closed throughout the majority of the session. He did not open his eyes to verbal command. Pt required continuous tactile stimulation to engage. Slightly improved engagement once seated up. No command following. Following commands: Impaired Following commands impaired: Follows one step commands with  increased time, Follows one step commands inconsistently    Cueing Cueing Techniques: Verbal cues, Tactile cues  Exercises      General Comments General comments (skin integrity, edema, etc.): VSS      Pertinent Vitals/Pain Pain Assessment Pain Assessment: No/denies pain    Home Living                          Prior Function            PT Goals (current goals can now be found in the care plan section) Progress towards PT goals: Progressing toward goals    Frequency    Min 2X/week      PT Plan      Co-evaluation              AM-PAC PT 6 Clicks Mobility   Outcome Measure  Help needed turning from your back to your side while in a flat bed without using bedrails?: A Lot Help needed moving from lying on your back to sitting on the side of a flat bed without using bedrails?: A Lot Help needed moving to and from a bed to a chair (including a wheelchair)?: Total Help needed standing up from a chair using your arms (e.g., wheelchair or bedside chair)?: A Lot Help needed to walk in hospital room?: Total Help needed climbing 3-5 steps with a railing? : Total 6 Click Score: 9    End of Session Equipment Utilized During Treatment: Gait belt Activity Tolerance: Patient tolerated treatment well Patient left: in chair;with chair alarm set;with call bell/phone within reach Nurse Communication: Mobility status;Need for lift equipment (Lift pad placed in chair) PT Visit Diagnosis: Unsteadiness on feet (R26.81);Muscle weakness (generalized) (M62.81);Difficulty in walking, not elsewhere classified (R26.2);History of falling (Z91.81)     Time: 8574-8563 PT Time Calculation (min) (ACUTE ONLY): 11 min  Charges:    $Therapeutic Activity: 8-22 mins PT General Charges $$ ACUTE PT VISIT: 1 Visit                     Alicen Donalson B, PT, DPT Acute Rehab Services [phone]    Cornella Emmer 04/18/2024, 3:36 PM

## 2024-04-18 NOTE — Progress Notes (Signed)
 " PROGRESS NOTE    Jacob Barnes  FMW:[id number] DOB: [birth year] DOA: 04/07/2024 PCP: [name], MD  Brief Narrative:    84 y.o. M with dementia, lives at home, HTN, DM, malnutrition, CKD IV baseline 1.9-2.1 who presented with small intracranial hemorrhage.  This remains stable.  Hospital course is notable for poor intake, dehydration, altered mentation likely delirium.   Assessment & Plan:   Principal Problem:   Intraventricular hemorrhage (HCC) Active Problems:   Pressure injury of skin   Protein-calorie malnutrition, severe   Nontraumatic small subarachnoid hemorrhage Small intraventricular hemorrhage Small subdural hematoma Patient had had fall several days before Christmas.  Came to ER on Christmas for flank pain, found to have kidney stone.  Over next few days, family noticed he seemed more off, weaker, brought him to the ER. There, CT head 12/29 revealed small subdural hematoma, subarachnoid hemorrhage which remained stable on follow-up CT same day. - Head CT was repeated 1/4 due to increasing somnolence.  Shows decreasing intraventricular hemorrhage and unchanged mild hydrocephalus. - Neurology saw the patient earlier in the course and has signed off - 04/15/2024-More left usually.  No obvious medications.  No sedatives.  Repeat head CT to make sure he is not developing an increasing obstructive  hydrocephalus. 04/17/24-patient is slightly more alert today but really for the most part he somnolent all day.  Per nursing he did eat and take his medications.  I discussed with neurology we will try modafinil  to see if it makes a difference in his alertness during the day.  I am concerned about sending him to rehab and him not being alert enough to participate in to eat. 04/18/24: Patient seems marginally more alert today.  He will open his eyes he will certainly answer more questions.  Nurse assistant reports he is eating.   EEG unremarkable, echocardiogram showed severe LVH, but no  embolic source LDL 11, hemoglobin A1c 8.3% - PT/OT - Avoid aspirin  - Continue Lipitor, Keppra    Acute encephalopathy/delirium: Exacerbated by brain bleed/dementia.,  dehydration.  Hypoactive/somnolent with periods of alertness -Will follow delirium precautions. - Hold Aricept . - Avoid deliriogenic medications. -   Suspected aspiration pneumonia Fever 1/2.  Not able to articulate any symptoms.  Denies cough, chest pain, dysuria, suprapubic pain.  Given his poor prognosis, rather than get chest x-ray, I will treat aspiration empirically. - Cefadroxil  twice daily start 04/11/24 for 5 days     Severe protein calorie malnutrition - Continue feeding supplements - Recommend palliative care to follow at SNF - trial of remeron to stimulate appetite.    Chronic kidney disease stage IV Creatinine stable relative to baseline, suspect GFR significantly lower than predicted baseline creatinine given his severe muscle wasting.   Severe LVH Small pericardial effusion This is an incidental finding in the hospital on echocardiogram.  Cardiology were consulted, they recommended outpatient follow-up - Follow-up with cardiologist outpatient, pending goals of care    Cerebrovascular disease Hypertension Hyperlipidemia Blood pressure high normal - Continue amlodipine  - Continue Lipitor   History of seizures - Continue Keppra    Diabetes with hyperglycemia and chronic kidney disease Glucose mostly controlled in the last 24 hours - Continue sliding scale corrections - Hold home degludec/glargine, and Januvia    Anemia Thrombocytopenia Elevated serum free light chains Follows with Dr. Davonna at the [facility] cancer center.  He is not a candidate for chemotherapy, and so decision to biopsy his anemia, thrombocytopenia, and elevated free light chains has been deferred.  Platelets 61K on  admission, transfused platelets, increased to 90K here.   Certainly may just be anemia of chronic kidney  disease   Nephrolithiasis Kidney stone found week prior to admission, asymptomatic at present.  Microscopic hematuria on UA is from that, infection ruled out   Dysphagia: PLAN: Continue dysphagia 1/pureed diet with thin liquids.  Full supervision, assist with feeding due to inattention.  Rec SLP f/u at SNF to address potential for diet to be advanced.  He appears to be protecting airway, but dementia and new neuro deficits are impacting initiative to eat and to eat regular solid foods.  Trial of Remeron   DVT prophylaxis: Place and maintain sequential compression device Start: 04/08/24 0931  SCDs   Code Status: Limited: Do not attempt resuscitation (DNR) -DNR-LIMITED -Do Not Intubate/DNI  Family Communication: Discussed with his daughter, [name], [phone], who relays that she is the primary contact and caregiver.  Discussed that he is very somnolent.  No change in CT.  He is on Keppra  so we will send a level.  Darted on Remeron as he is really not eating much.  Concerned if we discharged him today he will dehydrate over the next several days and be back in the hospital.  [name] clearly states  her father would not want any sort of feeding tubes. Disposition Plan: SNF Reason for continuing need for hospitalization: lethargy, trial of modafinil   Objective: Vitals:   04/17/24 2019 04/17/24 2359 04/18/24 0423 04/18/24 0724  BP: 124/80 94/62 113/69 (!) 91/58  Pulse: 86 78 80 84  Resp: 17  16 16   Temp: 98.5 F (36.9 C) 98.1 F (36.7 C) 98.5 F (36.9 C) 98.3 F (36.8 C)  TempSrc: Oral Axillary Oral Oral  SpO2: 92% 100% 100% 99%  Weight:      Height:        Intake/Output Summary (Last 24 hours) at 04/18/2024 0828 Last data filed at 04/18/2024 9357 Gross per 24 hour  Intake 300 ml  Output 600 ml  Net -300 ml   Filed Weights   04/07/24 0949 04/07/24 2020 04/15/24 1140  Weight: 42.3 kg 39.2 kg 39.6 kg    Examination:  He remains lethargic with his eyes closed.  However he  will follow simple commands.  He took his medications but did not eat much.  Regular rate and rhythm no murmurs rubs gallops Lungs were clear throughout with poor effort Abdomen soft Extremities without edema  Data Reviewed: I have personally reviewed following labs and imaging studies  CBC: Recent Labs  Lab 04/12/24 0450 04/17/24 0245  WBC 8.0 9.1  NEUTROABS  --  5.3  HGB 10.7* 13.5  HCT 31.7* 40.3  MCV 90.1 90.4  PLT 88* 120*   Basic Metabolic Panel: Recent Labs  Lab 04/12/24 0450 04/13/24 0642 04/16/24 1359 04/17/24 0245  NA 133* 131* 130* 132*  K 4.7 4.9 5.5* 4.6  CL 100 101 98 96*  CO2 22 20* 26 28  GLUCOSE 279* 182* 169* 148*  BUN 37* 39* 27* 29*  CREATININE 1.84* 1.97* 1.59* 1.55*  CALCIUM  8.1* 8.2* 8.4* 9.1   GFR: Estimated Creatinine Clearance: 19.9 mL/min (A) (by C-G formula based on SCr of 1.55 mg/dL (H)). Liver Function Tests: Recent Labs  Lab 04/17/24 0245  AST 52*  ALT 46*  ALKPHOS 67  BILITOT 0.8  PROT 8.7*  ALBUMIN 4.1   No results for input(s): LIPASE, AMYLASE in the last 168 hours. No results for input(s): AMMONIA in the last 168 hours. Coagulation Profile: No  results for input(s): INR, PROTIME in the last 168 hours. Cardiac Enzymes: No results for input(s): CKTOTAL, CKMB, CKMBINDEX, TROPONINI in the last 168 hours. ProBNP, BNP (last 5 results) No results for input(s): PROBNP, BNP in the last 8760 hours. HbA1C: No results for input(s): HGBA1C in the last 72 hours. CBG: Recent Labs  Lab 04/17/24 0615 04/17/24 1137 04/17/24 1653 04/17/24 2020 04/18/24 0624  GLUCAP 178* 181* 246* 132* 208*   Lipid Profile: No results for input(s): CHOL, HDL, LDLCALC, TRIG, CHOLHDL, LDLDIRECT in the last 72 hours. Thyroid Function Tests: No results for input(s): TSH, T4TOTAL, FREET4, T3FREE, THYROIDAB in the last 72 hours. Anemia Panel: No results for input(s): VITAMINB12, FOLATE, FERRITIN,  TIBC, IRON , RETICCTPCT in the last 72 hours. Sepsis Labs: No results for input(s): PROCALCITON, LATICACIDVEN in the last 168 hours.  No results found for this or any previous visit (from the past 240 hours).    Radiology Studies: No results found.   Scheduled Meds:  amLODipine   2.5 mg Oral Daily   atorvastatin   10 mg Oral Daily   Chlorhexidine  Gluconate Cloth  6 each Topical Daily   dorzolamide -timolol   1 drop Both Eyes BID   feeding supplement  237 mL Oral BID BM   insulin  aspart  0-15 Units Subcutaneous TID WC   insulin  aspart  0-5 Units Subcutaneous QHS   latanoprost   1 drop Both Eyes QHS   levETIRAcetam   500 mg Oral BID   modafinil   100 mg Oral Daily   multivitamin with minerals  1 tablet Oral Daily   mupirocin  ointment   Nasal BID   mouth rinse  15 mL Mouth Rinse 4 times per day   tamsulosin   0.4 mg Oral QPC supper   Continuous Infusions:     LOS: 11 days   Time spent: 40 minutes  Lonni KANDICE Moose, MD  [facility]  04/18/2024, 8:28 AM   "

## 2024-04-18 NOTE — Plan of Care (Signed)
   Problem: Nutrition: Goal: Adequate nutrition will be maintained Outcome: Progressing

## 2024-04-19 LAB — GLUCOSE, CAPILLARY
Glucose-Capillary: 181 mg/dL — ABNORMAL HIGH (ref 70–99)
Glucose-Capillary: 302 mg/dL — ABNORMAL HIGH (ref 70–99)
Glucose-Capillary: 243 mg/dL — ABNORMAL HIGH (ref 70–99)
Glucose-Capillary: 142 mg/dL — ABNORMAL HIGH (ref 70–99)

## 2024-04-19 LAB — LEVETIRACETAM LEVEL: Levetiracetam Lvl: 16.6 ug/mL (ref 10.0–40.0)

## 2024-04-19 NOTE — Plan of Care (Signed)
" °  Problem: Education: Goal: Knowledge of General Education information will improve Description: Including pain rating scale, medication(s)/side effects and non-pharmacologic comfort measures Outcome: Not Progressing   Problem: Health Behavior/Discharge Planning: Goal: Ability to manage health-related needs will improve Outcome: Not Progressing   Problem: Clinical Measurements: Goal: Ability to maintain clinical measurements within normal limits will improve Outcome: Not Progressing Goal: Will remain free from infection Outcome: Not Progressing Goal: Diagnostic test results will improve Outcome: Not Progressing Goal: Respiratory complications will improve Outcome: Not Progressing Goal: Cardiovascular complication will be avoided Outcome: Not Progressing   Problem: Activity: Goal: Risk for activity intolerance will decrease Outcome: Not Progressing   Problem: Nutrition: Goal: Adequate nutrition will be maintained Outcome: Not Progressing   Problem: Coping: Goal: Level of anxiety will decrease Outcome: Not Progressing   Problem: Elimination: Goal: Will not experience complications related to bowel motility Outcome: Not Progressing Goal: Will not experience complications related to urinary retention Outcome: Not Progressing   Problem: Pain Managment: Goal: General experience of comfort will improve and/or be controlled Outcome: Not Progressing   Problem: Safety: Goal: Ability to remain free from injury will improve Outcome: Not Progressing   Problem: Skin Integrity: Goal: Risk for impaired skin integrity will decrease Outcome: Not Progressing   Problem: Education: Goal: Knowledge of disease or condition will improve Outcome: Not Progressing Goal: Knowledge of secondary prevention will improve (MUST DOCUMENT ALL) Outcome: Not Progressing Goal: Knowledge of patient specific risk factors will improve (DELETE if not current risk factor) Outcome: Not Progressing    Problem: Intracerebral Hemorrhage Tissue Perfusion: Goal: Complications of Intracerebral Hemorrhage will be minimized Outcome: Not Progressing   Problem: Coping: Goal: Will verbalize positive feelings about self Outcome: Not Progressing Goal: Will identify appropriate support needs Outcome: Not Progressing   Problem: Health Behavior/Discharge Planning: Goal: Ability to manage health-related needs will improve Outcome: Not Progressing Goal: Goals will be collaboratively established with patient/family Outcome: Not Progressing   Problem: Self-Care: Goal: Ability to participate in self-care as condition permits will improve Outcome: Not Progressing Goal: Verbalization of feelings and concerns over difficulty with self-care will improve Outcome: Not Progressing Goal: Ability to communicate needs accurately will improve Outcome: Not Progressing   Problem: Nutrition: Goal: Risk of aspiration will decrease Outcome: Not Progressing Goal: Dietary intake will improve Outcome: Not Progressing   Problem: Education: Goal: Ability to describe self-care measures that may prevent or decrease complications (Diabetes Survival Skills Education) will improve Outcome: Not Progressing Goal: Individualized Educational Video(s) Outcome: Not Progressing   Problem: Coping: Goal: Ability to adjust to condition or change in health will improve Outcome: Not Progressing   Problem: Fluid Volume: Goal: Ability to maintain a balanced intake and output will improve Outcome: Not Progressing   Problem: Health Behavior/Discharge Planning: Goal: Ability to identify and utilize available resources and services will improve Outcome: Not Progressing Goal: Ability to manage health-related needs will improve Outcome: Not Progressing   Problem: Metabolic: Goal: Ability to maintain appropriate glucose levels will improve Outcome: Not Progressing   Problem: Nutritional: Goal: Maintenance of adequate  nutrition will improve Outcome: Not Progressing Goal: Progress toward achieving an optimal weight will improve Outcome: Not Progressing   Problem: Skin Integrity: Goal: Risk for impaired skin integrity will decrease Outcome: Not Progressing   Problem: Tissue Perfusion: Goal: Adequacy of tissue perfusion will improve Outcome: Not Progressing   "

## 2024-04-19 NOTE — Progress Notes (Signed)
 " PROGRESS NOTE    JANSEN SCIUTO  FMW:[id number] DOB: 04-14-1940 DOA: 04/07/2024 PCP: [name], MD  Brief Narrative:    84 y.o. M with dementia, lives at home, HTN, DM, malnutrition, CKD IV baseline 1.9-2.1 who presented with small intracranial hemorrhage.  This remains stable.  Hospital course is notable for poor intake, dehydration, altered mentation likely delirium.   Assessment & Plan:   Principal Problem:   Intraventricular hemorrhage (HCC) Active Problems:   Pressure injury of skin   Protein-calorie malnutrition, severe   Nontraumatic small subarachnoid hemorrhage Small intraventricular hemorrhage Small subdural hematoma Patient had had fall several days before Christmas.  Came to ER on Christmas for flank pain, found to have kidney stone.  Over next few days, family noticed he seemed more off, weaker, brought him to the ER. There, CT head 12/29 revealed small subdural hematoma, subarachnoid hemorrhage which remained stable on follow-up CT same day. - Head CT was repeated 1/4 due to increasing somnolence.  Shows decreasing intraventricular hemorrhage and unchanged mild hydrocephalus. - Neurology saw the patient earlier in the course and has signed off - 04/15/2024-More left usually.  No obvious medications.  No sedatives.  Repeat head CT to make sure he is not developing an increasing obstructive  hydrocephalus. 04/17/24-patient is slightly more alert today but really for the most part he somnolent all day.  Per nursing he did eat and take his medications.  I discussed with neurology we will try modafinil  to see if it makes a difference in his alertness during the day.  I am concerned about sending him to rehab and him not being alert enough to participate in to eat. 04/18/24: Patient seems marginally more alert today.  He will open his eyes he will certainly answer more questions.  Nurse assistant reports he is eating. 04/19/24: Patient seen earlier this morning on rounds.  Son  [name] was in the room.  Patient is definitely more awake and alert more interactive.  He did does still tend to nod off fairly frequently but he was much more awake then yesterday.  Son stated he ate 100% of his breakfast.  No choking.  Not sure which one is helping but we will continue the Remeron and the Provigil .  Given that he is eating better and is more awake I think he could transfer to rehab on Monday.  Discussed with his daughter [name] by telephone.   EEG unremarkable, echocardiogram showed severe LVH, but no embolic source LDL 11, hemoglobin A1c 8.3% - PT/OT - Avoid aspirin  - Continue Lipitor, Keppra    Acute encephalopathy/delirium: Exacerbated by brain bleed/dementia.,  dehydration.  Hypoactive/somnolent with periods of alertness -Will follow delirium precautions. - Hold Aricept . - Avoid deliriogenic medications. -   Suspected aspiration pneumonia Fever 1/2.  Not able to articulate any symptoms.  Denies cough, chest pain, dysuria, suprapubic pain.  Given his poor prognosis, rather than get chest x-ray, I will treat aspiration empirically. - Cefadroxil  twice daily start 04/11/24 for 5 days     Severe protein calorie malnutrition - Continue feeding supplements - Recommend palliative care to follow at SNF - trial of remeron to stimulate appetite.    Chronic kidney disease stage IV Creatinine stable relative to baseline, suspect GFR significantly lower than predicted baseline creatinine given his severe muscle wasting.   Severe LVH Small pericardial effusion This is an incidental finding in the hospital on echocardiogram.  Cardiology were consulted, they recommended outpatient follow-up - Follow-up with cardiologist outpatient, pending goals of  care    Cerebrovascular disease Hypertension Hyperlipidemia Blood pressure high normal - Continue amlodipine  - Continue Lipitor   History of seizures - Continue Keppra    Diabetes with hyperglycemia and chronic kidney  disease Glucose mostly controlled in the last 24 hours - Continue sliding scale corrections - Hold home degludec/glargine, and Januvia    Anemia Thrombocytopenia Elevated serum free light chains Follows with Dr. Davonna at the [facility] cancer center.  He is not a candidate for chemotherapy, and so decision to biopsy his anemia, thrombocytopenia, and elevated free light chains has been deferred.  Platelets 61K on admission, transfused platelets, increased to 90K here.   Certainly may just be anemia of chronic kidney disease   Nephrolithiasis Kidney stone found week prior to admission, asymptomatic at present.  Microscopic hematuria on UA is from that, infection ruled out   Dysphagia: PLAN: Continue dysphagia 1/pureed diet with thin liquids.  Full supervision, assist with feeding due to inattention.  Rec SLP f/u at SNF to address potential for diet to be advanced.  He appears to be protecting airway, but dementia and new neuro deficits are impacting initiative to eat and to eat regular solid foods.  Trial of Remeron   DVT prophylaxis: Place and maintain sequential compression device Start: 04/08/24 0931  SCDs   Code Status: Limited: Do not attempt resuscitation (DNR) -DNR-LIMITED -Do Not Intubate/DNI  Family Communication: Discussed with his daughter, Miriam Kestler, [phone], who relays that she is the primary contact and caregiver.  Discussed that he is very somnolent.  No change in CT.  He is on Keppra  so we will send a level.  Darted on Remeron as he is really not eating much.  Concerned if we discharged him today he will dehydrate over the next several days and be back in the hospital.  [name] clearly states  her father would not want any sort of feeding tubes. Disposition Plan: SNF Reason for continuing need for hospitalization: lethargy, trial of modafinil  and Remeron  Objective: Vitals:   04/19/24 0329 04/19/24 0711 04/19/24 1103 04/19/24 1502  BP: 108/67 116/66 103/69 105/63   Pulse: 71 71 77 81  Resp: 18 15 14 16   Temp: (!) 97.4 F (36.3 C) 97.6 F (36.4 C) 97.9 F (36.6 C) 98.2 F (36.8 C)  TempSrc: Oral Oral Oral Oral  SpO2: 99% 100% 100% 99%  Weight:      Height:        Intake/Output Summary (Last 24 hours) at 04/19/2024 1754 Last data filed at 04/19/2024 1700 Gross per 24 hour  Intake 776 ml  Output 800 ml  Net -24 ml   Filed Weights   04/07/24 0949 04/07/24 2020 04/15/24 1140  Weight: 42.3 kg 39.2 kg 39.6 kg    Examination:  He remains lethargic with his eyes closed.  However he will follow simple commands.  He took his medications but did not eat much.  Regular rate and rhythm no murmurs rubs gallops Lungs were clear throughout with poor effort Abdomen soft Extremities without edema  Data Reviewed: I have personally reviewed following labs and imaging studies  CBC: Recent Labs  Lab 04/17/24 0245  WBC 9.1  NEUTROABS 5.3  HGB 13.5  HCT 40.3  MCV 90.4  PLT 120*   Basic Metabolic Panel: Recent Labs  Lab 04/13/24 0642 04/16/24 1359 04/17/24 0245  NA 131* 130* 132*  K 4.9 5.5* 4.6  CL 101 98 96*  CO2 20* 26 28  GLUCOSE 182* 169* 148*  BUN 39* 27* 29*  CREATININE 1.97* 1.59* 1.55*  CALCIUM  8.2* 8.4* 9.1   GFR: Estimated Creatinine Clearance: 19.9 mL/min (A) (by C-G formula based on SCr of 1.55 mg/dL (H)). Liver Function Tests: Recent Labs  Lab 04/17/24 0245  AST 52*  ALT 46*  ALKPHOS 67  BILITOT 0.8  PROT 8.7*  ALBUMIN 4.1   No results for input(s): LIPASE, AMYLASE in the last 168 hours. No results for input(s): AMMONIA in the last 168 hours. Coagulation Profile: No results for input(s): INR, PROTIME in the last 168 hours. Cardiac Enzymes: No results for input(s): CKTOTAL, CKMB, CKMBINDEX, TROPONINI in the last 168 hours. ProBNP, BNP (last 5 results) No results for input(s): PROBNP, BNP in the last 8760 hours. HbA1C: No results for input(s): HGBA1C in the last 72  hours. CBG: Recent Labs  Lab 04/18/24 1753 04/18/24 2119 04/19/24 0608 04/19/24 1104 04/19/24 1607  GLUCAP 191* 165* 181* 302* 243*   Lipid Profile: No results for input(s): CHOL, HDL, LDLCALC, TRIG, CHOLHDL, LDLDIRECT in the last 72 hours. Thyroid Function Tests: No results for input(s): TSH, T4TOTAL, FREET4, T3FREE, THYROIDAB in the last 72 hours. Anemia Panel: No results for input(s): VITAMINB12, FOLATE, FERRITIN, TIBC, IRON , RETICCTPCT in the last 72 hours. Sepsis Labs: No results for input(s): PROCALCITON, LATICACIDVEN in the last 168 hours.  No results found for this or any previous visit (from the past 240 hours).    Radiology Studies: No results found.   Scheduled Meds:  amLODipine   2.5 mg Oral Daily   atorvastatin   10 mg Oral Daily   Chlorhexidine  Gluconate Cloth  6 each Topical Daily   dorzolamide -timolol   1 drop Both Eyes BID   feeding supplement  237 mL Oral BID BM   insulin  aspart  0-15 Units Subcutaneous TID WC   insulin  aspart  0-5 Units Subcutaneous QHS   latanoprost   1 drop Both Eyes QHS   levETIRAcetam   500 mg Oral BID   modafinil   100 mg Oral Daily   multivitamin with minerals  1 tablet Oral Daily   mupirocin  ointment   Nasal BID   mouth rinse  15 mL Mouth Rinse 4 times per day   tamsulosin   0.4 mg Oral QPC supper   Continuous Infusions:     LOS: 12 days   Time spent: 40 minutes  Lonni KANDICE Moose, MD  [facility]  04/19/2024, 5:54 PM   "

## 2024-04-20 DIAGNOSIS — I615 Nontraumatic intracerebral hemorrhage, intraventricular: Secondary | ICD-10-CM | POA: Diagnosis not present

## 2024-04-20 LAB — BASIC METABOLIC PANEL WITH GFR
Anion gap: 7 (ref 5–15)
BUN: 30 mg/dL — ABNORMAL HIGH (ref 8–23)
CO2: 26 mmol/L (ref 22–32)
Calcium: 8.3 mg/dL — ABNORMAL LOW (ref 8.9–10.3)
Chloride: 96 mmol/L — ABNORMAL LOW (ref 98–111)
Creatinine, Ser: 1.68 mg/dL — ABNORMAL HIGH (ref 0.61–1.24)
GFR, Estimated: 40 mL/min — ABNORMAL LOW
Glucose, Bld: 154 mg/dL — ABNORMAL HIGH (ref 70–99)
Potassium: 4.8 mmol/L (ref 3.5–5.1)
Sodium: 129 mmol/L — ABNORMAL LOW (ref 135–145)

## 2024-04-20 LAB — GLUCOSE, CAPILLARY
Glucose-Capillary: 170 mg/dL — ABNORMAL HIGH (ref 70–99)
Glucose-Capillary: 117 mg/dL — ABNORMAL HIGH (ref 70–99)
Glucose-Capillary: 233 mg/dL — ABNORMAL HIGH (ref 70–99)
Glucose-Capillary: 133 mg/dL — ABNORMAL HIGH (ref 70–99)

## 2024-04-20 LAB — CBC WITH DIFFERENTIAL/PLATELET
Abs Immature Granulocytes: 0.03 K/uL (ref 0.00–0.07)
Basophils Absolute: 0 K/uL (ref 0.0–0.1)
Basophils Relative: 0 %
Eosinophils Absolute: 0 K/uL (ref 0.0–0.5)
Eosinophils Relative: 0 %
HCT: 29.3 % — ABNORMAL LOW (ref 39.0–52.0)
Hemoglobin: 9.8 g/dL — ABNORMAL LOW (ref 13.0–17.0)
Immature Granulocytes: 0 %
Lymphocytes Relative: 20 %
Lymphs Abs: 1.4 K/uL (ref 0.7–4.0)
MCH: 30.4 pg (ref 26.0–34.0)
MCHC: 33.4 g/dL (ref 30.0–36.0)
MCV: 91 fL (ref 80.0–100.0)
Monocytes Absolute: 2.4 K/uL — ABNORMAL HIGH (ref 0.1–1.0)
Monocytes Relative: 34 %
Neutro Abs: 3.3 K/uL (ref 1.7–7.7)
Neutrophils Relative %: 46 %
Platelets: 118 K/uL — ABNORMAL LOW (ref 150–400)
RBC: 3.22 MIL/uL — ABNORMAL LOW (ref 4.22–5.81)
RDW: 14.9 % (ref 11.5–15.5)
WBC: 7.2 K/uL (ref 4.0–10.5)
nRBC: 0 % (ref 0.0–0.2)

## 2024-04-20 LAB — VITAMIN D 25 HYDROXY (VIT D DEFICIENCY, FRACTURES): Vit D, 25-Hydroxy: 40.6 ng/mL (ref 30–100)

## 2024-04-20 LAB — PHOSPHORUS: Phosphorus: 3 mg/dL (ref 2.5–4.6)

## 2024-04-20 LAB — MAGNESIUM: Magnesium: 1.8 mg/dL (ref 1.7–2.4)

## 2024-04-20 NOTE — Evaluation (Signed)
 Clinical/Bedside Swallow Evaluation Patient Details  Name: Jacob Barnes MRN: [id number] Date of Birth: 12-27-1940  Today's Date: 04/20/2024 Time: SLP Start Time (ACUTE ONLY): 1640 SLP Stop Time (ACUTE ONLY): 1652 SLP Time Calculation (min) (ACUTE ONLY): 12 min  Past Medical History:  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   Anemia    Arthritis    hands   Carpal tunnel syndrome 08/05/2009   Qualifier: Diagnosis of  By: [name], [name]     Cataract 2017   Chronic kidney disease    Diabetes mellitus without complication (HCC)    Phreesia 04/03/2020   ERECTILE DYSFUNCTION, ORGANIC 01/20/2009   Qualifier: Diagnosis of  By: Antonetta MD, Margaret     GERD (gastroesophageal reflux disease)    Glaucoma    both eyes   History of kidney stones    Hyperlipemia    Hypertension    Iron  deficiency anemia 01/10/2014   Osteoporosis September 2021   Other pancytopenia (HCC) 01/10/2014   New in 01/2014, undergoing pathology review    Rash    front abdomen no drainage   Past Surgical History:  Past Surgical History:  Procedure Laterality Date   AMPUTATION Right 09/01/2016   Procedure: AMPUTATION RIGHT MIDDLE FINGER TIP;  Surgeon: [name], MD;  Location: [facility];  Service: Orthopedics;  Laterality: Right;   AMPUTATION Right 10/10/2016   Procedure: REPEAT IRRIGATION AND DEBRIDEMENT RIGHT MIDDLE FINGER WITH AMPUTATION THROUGH PROXIMAL PHALANX;  Surgeon: [name], MD;  Location: [facility];  Service: Orthopedics;  Laterality: Right;   APPENDECTOMY     BIOPSY  01/13/2022   Procedure: BIOPSY;  Surgeon: [name], MD;  Location: [facility];  Service: Gastroenterology;;   COLONOSCOPY  09/06/2011   Procedure: COLONOSCOPY;  Surgeon: [name], MD;  Location: [facility];  Service: Endoscopy;  Laterality: N/A;  830   COLONOSCOPY WITH PROPOFOL  N/A 01/13/2022   Procedure: COLONOSCOPY WITH  PROPOFOL ;  Surgeon: [name], MD;  Location: [facility];  Service: Gastroenterology;  Laterality: N/A;  200 ASA 3   CYSTOSCOPY/RETROGRADE/URETEROSCOPY/STONE EXTRACTION WITH BASKET     ESOPHAGOGASTRODUODENOSCOPY N/A 01/15/2014   Procedure: ESOPHAGOGASTRODUODENOSCOPY (EGD);  Surgeon: [name], MD;  [facility]: [facility];  Service: Endoscopy;  Laterality: N/A;  200   ESOPHAGOGASTRODUODENOSCOPY N/A 04/22/2014   Procedure: ESOPHAGOGASTRODUODENOSCOPY (EGD);  Surgeon: [name], MD;  [facility];  Service: Endoscopy;  Laterality: N/A;  240   EXTRACORPOREAL SHOCK WAVE LITHOTRIPSY Left 01/02/2023   Procedure: EXTRACORPOREAL SHOCK WAVE LITHOTRIPSY (ESWL);  Surgeon: [name], MD;  Location: [facility];  Service: Urology;  Laterality: Left;   EYE SURGERY Bilateral yrs ago   ioc for cataract   IR ANGIO EXTERNAL CAROTID SEL EXT CAROTID UNI L MOD SED  03/22/2022   IR ANGIO INTRA EXTRACRAN SEL INTERNAL CAROTID UNI L MOD SED  03/26/2022   IR ANGIOGRAM FOLLOW UP STUDY  03/26/2022   IR NEURO EACH ADD'L AFTER BASIC UNI LEFT (MS)  03/26/2022   IR TRANSCATH/EMBOLIZ  03/26/2022   MALONEY DILATION N/A 01/15/2014   Procedure: Jacob Barnes DILATION;  Surgeon: [name], MD;  [facility];  Service: Endoscopy;  Laterality: N/A;   MALONEY DILATION  04/22/2014   Procedure: Jacob Barnes DILATION;  Surgeon: [name], MD;  [facility];  Service: Endoscopy;;   POLYPECTOMY  01/13/2022   Procedure: POLYPECTOMY;  Surgeon:  Eartha Flavors, Toribio, MD;  [facility]: [facility];  Service: Gastroenterology;;   RADIOLOGY WITH ANESTHESIA Left 03/22/2022   Procedure: LEFT MMA EMBOLIZATION;  Surgeon: Lanis Pupa, MD;  Location: [facility] OR;  Service: Radiology;  Laterality: Left;   surgery for left eye bleedf  2017   HPI:  Jacob Barnes is a 84 y.o. male who presented to ED 12/29 with confusion and generalized weakness. CT head demonstrated IVH centered in the  L choroid plexus with layering hemorrhage in BL occipital lobes. Pt has been pocketing POs; no difficulty with liquids per RN. PMH: dementia, HTN, HLD, GERD, DM2, osteoporosis, anemia, thrombocytopenia, prior L SDH, retinopathy.  BSE completed on 1/5 with recommendations for Dys 1 and thin liquids.  SLP re-consulted on 1/10 secondary to pt being more alert.    Assessment / Plan / Recommendation  Clinical Impression  Pt was seen for a clinical swallow evaluation and presents with oral dysphagia likely secondary to missing dentition and cognition.  Pt was seen with trials of thin liquid, puree, and soft solids.  He exhibited prolonged oral transit of puree and soft solids with prolonged mastication of soft solids as well.  No overt s/sx of aspiration were observed with PO intake.  Spoke with pt regarding preference for Dysaphia 1 vs Dysphagia 2 solids and he stated that he didn't have a strong preference and was content with his current diet.    PLAN: Continuation of Dysphagia 1 (puree) solids and thin liquids with full supervision and assistance for feeding.  SLP will briefly f/u to monitor diet tolerance and determine if diet upgrade is appropriate.  SLP Visit Diagnosis: Dysphagia, oral phase (R13.11)    Aspiration Risk  No limitations    Diet Recommendation           Other Recommendations Oral Care Recommendations: Oral care BID     Swallow Evaluation Recommendations Recommendations: PO diet PO Diet Recommendation: Dysphagia 1 (Pureed);Thin liquids (Level 0) Liquid Administration via: Cup;Straw Medication Administration: Crushed with puree Supervision: Full assist for feeding Oral care recommendations: Oral care BID (2x/day);Staff/trained caregiver to provide oral care   Assistance Recommended at Discharge    Functional Status Assessment Patient has had a recent decline in their functional status and/or demonstrates limited ability to make significant improvements in function in a  reasonable and predictable amount of time  Frequency and Duration min 1 x/week  1 week       Prognosis Prognosis for improved oropharyngeal function: Fair Barriers to Reach Goals: Cognitive deficits      Swallow Study   General Date of Onset: 04/20/24 HPI: WYNDELL CARDIFF is a 84 y.o. male who who presented to ED 12/29 with confusion and generalized weakness. CT head demonstrated IVH centered in the L choroid plexus with layering hemorrhage in BL occipital lobes. Pt has been pocketing POs; no difficulty with liquids per RN. PMH: dementia, HTN, HLD, GERD, DM2, osteoporosis, anemia, thrombocytopenia, prior L SDH, retinopathy.  BSE completed on 1/5 with recommendations for Dys 1 and thin liquids.  SLP re-consulted on 1/10 secondary to pt being more alert. Type of Study: Bedside Swallow Evaluation Previous Swallow Assessment: see HPI Diet Prior to this Study: Dysphagia 1 (pureed);Thin liquids (Level 0) Temperature Spikes Noted: No Respiratory Status: Room air History of Recent Intubation: No Behavior/Cognition: Lethargic/Drowsy;Pleasant mood;Cooperative Oral Cavity Assessment: Within Functional Limits Oral Care Completed by SLP: No Oral Cavity - Dentition: Missing dentition Vision: Impaired for self-feeding Self-Feeding Abilities: Needs assist Patient Positioning: Upright in bed  Baseline Vocal Quality: Normal Volitional Cough: Cognitively unable to elicit Volitional Swallow: Able to elicit    Oral/Motor/Sensory Function Overall Oral Motor/Sensory Function: Within functional limits   Ice Chips Ice chips: Not tested   Thin Liquid Thin Liquid: Within functional limits Presentation: Straw    Nectar Thick Nectar Thick Liquid: Not tested   Honey Thick Honey Thick Liquid: Not tested   Puree Puree: Impaired Presentation: Spoon Oral Phase Functional Implications: Prolonged oral transit   Solid     Solid: Impaired Presentation: Spoon Oral Phase Impairments: Impaired mastication Oral  Phase Functional Implications: Prolonged oral transit     Earnie Cable, M.S., CCC-SLP Acute Rehabilitation Services Office: [phone]  Earnie SQUIBB Clairissa Valvano 04/20/2024,4:57 PM

## 2024-04-20 NOTE — Plan of Care (Signed)
  Problem: Health Behavior/Discharge Planning: Goal: Ability to manage health-related needs will improve Outcome: Progressing   Problem: Clinical Measurements: Goal: Will remain free from infection Outcome: Progressing   Problem: Clinical Measurements: Goal: Respiratory complications will improve Outcome: Progressing   Problem: Activity: Goal: Risk for activity intolerance will decrease Outcome: Progressing   Problem: Nutrition: Goal: Adequate nutrition will be maintained Outcome: Progressing   Problem: Coping: Goal: Level of anxiety will decrease Outcome: Progressing   Problem: Elimination: Goal: Will not experience complications related to bowel motility Outcome: Progressing   Problem: Safety: Goal: Ability to remain free from injury will improve Outcome: Progressing   Problem: Skin Integrity: Goal: Risk for impaired skin integrity will decrease Outcome: Progressing   Problem: Intracerebral Hemorrhage Tissue Perfusion: Goal: Complications of Intracerebral Hemorrhage will be minimized Outcome: Progressing

## 2024-04-20 NOTE — Progress Notes (Signed)
 " PROGRESS NOTE    Jacob Barnes  FMW:[id number] DOB: 07-09-1940 DOA: 04/07/2024 PCP: Jacob Rollene BRAVO, MD  Brief Narrative:    84 y.o. M with dementia, lives at home, HTN, DM, malnutrition, CKD IV baseline 1.9-2.1 who presented with small intracranial hemorrhage.  This remains stable.  Hospital course is notable for poor intake, dehydration, altered mentation likely delirium.   Assessment & Plan:   Principal Problem:   Intraventricular hemorrhage (HCC) Active Problems:   Pressure injury of skin   Protein-calorie malnutrition, severe   Nontraumatic small subarachnoid hemorrhage Small intraventricular hemorrhage Small subdural hematoma Patient had had fall several days before Christmas.  Came to ER on Christmas for flank pain, found to have kidney stone.  Over next few days, family noticed he seemed more off, weaker, brought him to the ER. There, CT head 12/29 revealed small subdural hematoma, subarachnoid hemorrhage which remained stable on follow-up CT same day. - Head CT was repeated 1/4 due to increasing somnolence.  Shows decreasing intraventricular hemorrhage and unchanged mild hydrocephalus. - Neurology saw the patient earlier in the course and has signed off - 04/15/2024-More left usually.  No obvious medications.  No sedatives.  Repeat head CT to make sure he is not developing an increasing obstructive  hydrocephalus. 04/17/24-patient is slightly more alert  but really for the most part he somnolent all day.  Per nursing he did eat and take his medications.  I discussed with neurology we will try modafinil  to see if it makes a difference in his alertness during the day.  I am concerned about sending him to rehab and him not being alert enough to participate in to eat. 04/18/24: Patient seems marginally more alert.  He will open his eyes he will certainly answer more questions.  Nurse assistant reports he is eating. 04/19/24: Patient seen earlier this morning on rounds.  Son Jacob Barnes was in  the room.  Patient is definitely more awake and alert more interactive.  He did does still tend to nod off fairly frequently but he was much more awake then yesterday.  Son stated he ate 100% of his breakfast.  No choking.  Not sure which one is helping but we will continue the Remeron and the Provigil .  Given that he is eating better and is more awake I think he could transfer to rehab on Monday.  Discussed with his daughter Jacob Barnes by telephone.   EEG unremarkable, echocardiogram showed severe LVH, but no embolic source LDL 11, hemoglobin A1c 8.3% - PT/OT - Avoid aspirin  - Continue Lipitor, Keppra    Acute encephalopathy/delirium: Exacerbated by brain bleed/dementia.,  dehydration.  Hypoactive/somnolent with periods of alertness -Will follow delirium precautions. - Hold Aricept . - Avoid deliriogenic medications. -   Suspected aspiration pneumonia Fever 1/2.  Not able to articulate any symptoms.  Denies cough, chest pain, dysuria, suprapubic pain.  Given his poor prognosis, rather than get chest x-ray, I will treat aspiration empirically. - Cefadroxil  twice daily start 04/11/24 for 5 days     Severe protein calorie malnutrition - Continue feeding supplements - Recommend palliative care to follow at SNF - trial of remeron to stimulate appetite.    Chronic kidney disease stage IV Creatinine stable relative to baseline, suspect GFR significantly lower than predicted baseline creatinine given his severe muscle wasting.   Severe LVH Small pericardial effusion This is an incidental finding in the hospital on echocardiogram.  Cardiology were consulted, they recommended outpatient follow-up - Follow-up with cardiologist outpatient, pending goals of care  Cerebrovascular disease Hypertension Hyperlipidemia Blood pressure high normal - Continue amlodipine  - Continue Lipitor   History of seizures - Continue Keppra    Diabetes with hyperglycemia and chronic kidney disease Glucose  mostly controlled in the last 24 hours - Continue sliding scale corrections - Hold home degludec/glargine, and Januvia    Anemia Thrombocytopenia Elevated serum free light chains Follows with Jacob [facility] at the [facility] center.  He is not a candidate for chemotherapy, and so decision to biopsy his anemia, thrombocytopenia, and elevated free light chains has been deferred.  Platelets 61K on admission, transfused platelets, increased to 90K here.   Certainly may just be anemia of chronic kidney disease   Nephrolithiasis Kidney stone found week prior to admission, asymptomatic at present.  Microscopic hematuria on UA is from that, infection ruled out   Dysphagia: PLAN: Continue dysphagia 1/pureed diet with thin liquids.  Full supervision, assist with feeding due to inattention.  Rec SLP f/u at SNF to address potential for diet to be advanced.  He appears to be protecting airway, but dementia and new neuro deficits are impacting initiative to eat and to eat regular solid foods.  Trial of Remeron   DVT prophylaxis: Place and maintain sequential compression device Start: 04/08/24 0931  SCDs   Code Status: Limited: Do not attempt resuscitation (DNR) -DNR-LIMITED -Do Not Intubate/DNI  Family Communication: Discussed with his daughter, Jacob Barnes, [phone], who relays that she is the primary contact and caregiver.  Discussed that he is very somnolent.  No change in CT.  He is on Keppra  so we will send a level.  Darted on Remeron as he is really not eating much.  Concerned if we discharged him he will dehydrate over the next several days and be back in the hospital.  Jacob Barnes clearly states  her father would not want any sort of feeding tubes. Disposition Plan: SNF Reason for continuing need for hospitalization: lethargy, trial of modafinil  and Remeron  Objective: Vitals:   04/20/24 0310 04/20/24 0733 04/20/24 1127 04/20/24 1519  BP: 98/64 94/61 112/70 98/76  Pulse: 70 78 62 68   Resp: 14 20 14    Temp: 98.2 F (36.8 C) 97.9 F (36.6 C) 98 F (36.7 C)   TempSrc: Oral Oral Oral   SpO2: 99% 100% 100% 98%  Weight:      Height:        Intake/Output Summary (Last 24 hours) at 04/20/2024 1545 Last data filed at 04/20/2024 0551 Gross per 24 hour  Intake 480 ml  Output 850 ml  Net -370 ml   Filed Weights   04/07/24 0949 04/07/24 2020 04/15/24 1140  Weight: 42.3 kg 39.2 kg 39.6 kg    Examination:  Patient was awake and alert today, remained pleasantly confused.  AO x 1. Regular rate and rhythm no murmurs rubs gallops Lungs were clear throughout with poor effort Abdomen soft Extremities without edema  Data Reviewed: I have personally reviewed following labs and imaging studies  CBC: Recent Labs  Lab 04/17/24 0245 04/20/24 0212  WBC 9.1 7.2  NEUTROABS 5.3 3.3  HGB 13.5 9.8*  HCT 40.3 29.3*  MCV 90.4 91.0  PLT 120* 118*   Basic Metabolic Panel: Recent Labs  Lab 04/16/24 1359 04/17/24 0245 04/20/24 0212 04/20/24 1254  NA 130* 132* 129*  --   K 5.5* 4.6 4.8  --   CL 98 96* 96*  --   CO2 26 28 26   --   GLUCOSE 169* 148* 154*  --  BUN 27* 29* 30*  --   CREATININE 1.59* 1.55* 1.68*  --   CALCIUM  8.4* 9.1 8.3*  --   MG  --   --   --  1.8  PHOS  --   --   --  3.0   GFR: Estimated Creatinine Clearance: 18.3 mL/min (A) (by C-G formula based on SCr of 1.68 mg/dL (H)). Liver Function Tests: Recent Labs  Lab 04/17/24 0245  AST 52*  ALT 46*  ALKPHOS 67  BILITOT 0.8  PROT 8.7*  ALBUMIN 4.1   No results for input(s): LIPASE, AMYLASE in the last 168 hours. No results for input(s): AMMONIA in the last 168 hours. Coagulation Profile: No results for input(s): INR, PROTIME in the last 168 hours. Cardiac Enzymes: No results for input(s): CKTOTAL, CKMB, CKMBINDEX, TROPONINI in the last 168 hours. ProBNP, BNP (last 5 results) No results for input(s): PROBNP, BNP in the last 8760 hours. HbA1C: No results for input(s):  HGBA1C in the last 72 hours. CBG: Recent Labs  Lab 04/19/24 1104 04/19/24 1607 04/19/24 2117 04/20/24 0608 04/20/24 1155  GLUCAP 302* 243* 142* 170* 117*   Lipid Profile: No results for input(s): CHOL, HDL, LDLCALC, TRIG, CHOLHDL, LDLDIRECT in the last 72 hours. Thyroid Function Tests: No results for input(s): TSH, T4TOTAL, FREET4, T3FREE, THYROIDAB in the last 72 hours. Anemia Panel: No results for input(s): VITAMINB12, FOLATE, FERRITIN, TIBC, IRON , RETICCTPCT in the last 72 hours. Sepsis Labs: No results for input(s): PROCALCITON, LATICACIDVEN in the last 168 hours.  No results found for this or any previous visit (from the past 240 hours).    Radiology Studies: No results found.   Scheduled Meds:  amLODipine   2.5 mg Oral Daily   atorvastatin   10 mg Oral Daily   Chlorhexidine  Gluconate Cloth  6 each Topical Daily   dorzolamide -timolol   1 drop Both Eyes BID   feeding supplement  237 mL Oral BID BM   insulin  aspart  0-15 Units Subcutaneous TID WC   insulin  aspart  0-5 Units Subcutaneous QHS   latanoprost   1 drop Both Eyes QHS   levETIRAcetam   500 mg Oral BID   modafinil   100 mg Oral Daily   multivitamin with minerals  1 tablet Oral Daily   mupirocin  ointment   Nasal BID   mouth rinse  15 mL Mouth Rinse 4 times per day   tamsulosin   0.4 mg Oral QPC supper   Continuous Infusions:     LOS: 13 days   Time spent: 40 minutes  Elvan Sor, MD  [facility]  04/20/2024, 3:45 PM   "

## 2024-04-21 DIAGNOSIS — I615 Nontraumatic intracerebral hemorrhage, intraventricular: Secondary | ICD-10-CM | POA: Diagnosis not present

## 2024-04-21 LAB — HEPATIC FUNCTION PANEL
ALT: 21 U/L (ref 0–44)
AST: 29 U/L (ref 15–41)
Albumin: 3.5 g/dL (ref 3.5–5.0)
Alkaline Phosphatase: 55 U/L (ref 38–126)
Bilirubin, Direct: 0.2 mg/dL (ref 0.0–0.2)
Indirect Bilirubin: 0.3 mg/dL (ref 0.3–0.9)
Total Bilirubin: 0.5 mg/dL (ref 0.0–1.2)
Total Protein: 7.3 g/dL (ref 6.5–8.1)

## 2024-04-21 LAB — BASIC METABOLIC PANEL WITH GFR
Anion gap: 6 (ref 5–15)
BUN: 29 mg/dL — ABNORMAL HIGH (ref 8–23)
CO2: 30 mmol/L (ref 22–32)
Calcium: 8.6 mg/dL — ABNORMAL LOW (ref 8.9–10.3)
Chloride: 96 mmol/L — ABNORMAL LOW (ref 98–111)
Creatinine, Ser: 1.68 mg/dL — ABNORMAL HIGH (ref 0.61–1.24)
GFR, Estimated: 40 mL/min — ABNORMAL LOW
Glucose, Bld: 141 mg/dL — ABNORMAL HIGH (ref 70–99)
Potassium: 5.1 mmol/L (ref 3.5–5.1)
Sodium: 131 mmol/L — ABNORMAL LOW (ref 135–145)

## 2024-04-21 LAB — MAGNESIUM: Magnesium: 1.8 mg/dL (ref 1.7–2.4)

## 2024-04-21 LAB — GLUCOSE, CAPILLARY
Glucose-Capillary: 158 mg/dL — ABNORMAL HIGH (ref 70–99)
Glucose-Capillary: 162 mg/dL — ABNORMAL HIGH (ref 70–99)

## 2024-04-21 LAB — CBC
HCT: 29.6 % — ABNORMAL LOW (ref 39.0–52.0)
Hemoglobin: 9.8 g/dL — ABNORMAL LOW (ref 13.0–17.0)
MCH: 30.3 pg (ref 26.0–34.0)
MCHC: 33.1 g/dL (ref 30.0–36.0)
MCV: 91.6 fL (ref 80.0–100.0)
Platelets: 117 K/uL — ABNORMAL LOW (ref 150–400)
RBC: 3.23 MIL/uL — ABNORMAL LOW (ref 4.22–5.81)
RDW: 14.8 % (ref 11.5–15.5)
WBC: 5.1 K/uL (ref 4.0–10.5)
nRBC: 0 % (ref 0.0–0.2)

## 2024-04-21 LAB — PHOSPHORUS: Phosphorus: 3.4 mg/dL (ref 2.5–4.6)

## 2024-04-21 MED ORDER — MODAFINIL 100 MG PO TABS: 100.0000 mg | ORAL_TABLET | Freq: Every day | ORAL | 0 refills | Status: AC | PRN

## 2024-04-21 NOTE — Progress Notes (Signed)
" °   04/21/24 1111  Spiritual Encounters  Type of Visit Initial  Care provided to: Patient  Referral source Chaplain assessment  Reason for visit Routine spiritual support  OnCall Visit No  Spiritual Framework  Presenting Themes Impactful experiences and emotions  Community/Connection Family  Patient Stress Factors Health changes;Major life changes  Family Stress Factors None identified  Interventions  Spiritual Care Interventions Made Compassionate presence;Established relationship of care and support;Prayer  Intervention Outcomes  Outcomes Awareness of support;Awareness of health   Chaplain met with the Pt and offered a word of prayer, providing spiritual and emotional support during the visit. "

## 2024-04-21 NOTE — Plan of Care (Signed)
  Problem: Education: Goal: Knowledge of General Education information will improve Description: Including pain rating scale, medication(s)/side effects and non-pharmacologic comfort measures Outcome: Progressing   Problem: Health Behavior/Discharge Planning: Goal: Ability to manage health-related needs will improve Outcome: Progressing   Problem: Clinical Measurements: Goal: Ability to maintain clinical measurements within normal limits will improve Outcome: Progressing Goal: Will remain free from infection Outcome: Progressing Goal: Diagnostic test results will improve Outcome: Progressing Goal: Respiratory complications will improve Outcome: Progressing Goal: Cardiovascular complication will be avoided Outcome: Progressing   Problem: Activity: Goal: Risk for activity intolerance will decrease Outcome: Progressing   Problem: Nutrition: Goal: Adequate nutrition will be maintained Outcome: Progressing   Problem: Coping: Goal: Level of anxiety will decrease Outcome: Progressing   Problem: Elimination: Goal: Will not experience complications related to bowel motility Outcome: Progressing Goal: Will not experience complications related to urinary retention Outcome: Progressing   Problem: Pain Managment: Goal: General experience of comfort will improve and/or be controlled Outcome: Progressing   Problem: Safety: Goal: Ability to remain free from injury will improve Outcome: Progressing   Problem: Skin Integrity: Goal: Risk for impaired skin integrity will decrease Outcome: Progressing   Problem: Education: Goal: Knowledge of disease or condition will improve Outcome: Progressing Goal: Knowledge of secondary prevention will improve (MUST DOCUMENT ALL) Outcome: Progressing Goal: Knowledge of patient specific risk factors will improve (DELETE if not current risk factor) Outcome: Progressing   Problem: Intracerebral Hemorrhage Tissue Perfusion: Goal: Complications  of Intracerebral Hemorrhage will be minimized Outcome: Progressing   Problem: Coping: Goal: Will verbalize positive feelings about self Outcome: Progressing Goal: Will identify appropriate support needs Outcome: Progressing   Problem: Health Behavior/Discharge Planning: Goal: Ability to manage health-related needs will improve Outcome: Progressing Goal: Goals will be collaboratively established with patient/family Outcome: Progressing   Problem: Self-Care: Goal: Ability to participate in self-care as condition permits will improve Outcome: Progressing Goal: Verbalization of feelings and concerns over difficulty with self-care will improve Outcome: Progressing Goal: Ability to communicate needs accurately will improve Outcome: Progressing   Problem: Nutrition: Goal: Risk of aspiration will decrease Outcome: Progressing Goal: Dietary intake will improve Outcome: Progressing   Problem: Education: Goal: Ability to describe self-care measures that may prevent or decrease complications (Diabetes Survival Skills Education) will improve Outcome: Progressing Goal: Individualized Educational Video(s) Outcome: Progressing   Problem: Coping: Goal: Ability to adjust to condition or change in health will improve Outcome: Progressing   Problem: Fluid Volume: Goal: Ability to maintain a balanced intake and output will improve Outcome: Progressing   Problem: Health Behavior/Discharge Planning: Goal: Ability to identify and utilize available resources and services will improve Outcome: Progressing Goal: Ability to manage health-related needs will improve Outcome: Progressing   Problem: Metabolic: Goal: Ability to maintain appropriate glucose levels will improve Outcome: Progressing   Problem: Nutritional: Goal: Maintenance of adequate nutrition will improve Outcome: Progressing Goal: Progress toward achieving an optimal weight will improve Outcome: Progressing   Problem: Skin  Integrity: Goal: Risk for impaired skin integrity will decrease Outcome: Progressing   Problem: Tissue Perfusion: Goal: Adequacy of tissue perfusion will improve Outcome: Progressing

## 2024-04-21 NOTE — Progress Notes (Signed)
 Report called to Powell, LPN at [facility]

## 2024-04-21 NOTE — TOC Transition Note (Signed)
 Transition of Care [facility]) - Discharge Note   Patient Details  Name: Jacob Barnes MRN: [id number] Date of Birth: 11/06/1940  Transition of [facility] CM/SW Contact:  Luann SHAUNNA Cumming, LCSW Phone Number: 04/21/2024, 12:39 PM   Clinical Narrative:     Per MD patient ready for DC to [facility]. RN, patient, patient's family, and facility notified of DC. Discharge Summary and FL2 sent to facility. RN to call report prior to discharge [phone]). DC packet on chart. Ambulance transport requested for patient.   CSW will sign off for now as social work intervention is no longer needed. Please consult us  again if new needs arise.   Final next level of care: Skilled Nursing Facility Barriers to Discharge: No Barriers Identified    Discharge Placement              Patient [facility] bed at:  [facility]) Patient to be transferred to facility by: [facility] Name of family member notified: [facility]  Daughter, Emergency Contact  [phone] (Mobile) Patient and family notified of of transfer: 04/21/24  Discharge Plan and Services Additional resources added to the After Visit Summary for                                       Social Drivers of Health (SDOH) Interventions SDOH Screenings   Food Insecurity: Food Insecurity Present (02/05/2024)  Housing: Low Risk (02/05/2024)  Transportation Needs: No Transportation Needs (02/05/2024)  Utilities: Not At Risk (02/05/2024)  Alcohol Screen: Low Risk (02/05/2024)  Depression (PHQ2-9): Low Risk (02/22/2024)  Financial Resource Strain: Patient Declined (02/05/2024)  Physical Activity: Inactive (02/05/2024)  Social Connections: Moderately Integrated (02/05/2024)  Stress: No Stress Concern Present (02/05/2024)  Tobacco Use: Low Risk (04/07/2024)  Health Literacy: Adequate Health Literacy (02/05/2024)     Readmission Risk Interventions     No data to display

## 2024-04-21 NOTE — Plan of Care (Signed)
 " Problem: Education: Goal: Knowledge of General Education information will improve Description: Including pain rating scale, medication(s)/side effects and non-pharmacologic comfort measures Outcome: Adequate for Discharge   Problem: Health Behavior/Discharge Planning: Goal: Ability to manage health-related needs will improve Outcome: Adequate for Discharge   Problem: Clinical Measurements: Goal: Ability to maintain clinical measurements within normal limits will improve Outcome: Adequate for Discharge Goal: Will remain free from infection Outcome: Adequate for Discharge Goal: Diagnostic test results will improve Outcome: Adequate for Discharge Goal: Respiratory complications will improve Outcome: Adequate for Discharge Goal: Cardiovascular complication will be avoided Outcome: Adequate for Discharge   Problem: Activity: Goal: Risk for activity intolerance will decrease Outcome: Adequate for Discharge   Problem: Nutrition: Goal: Adequate nutrition will be maintained Outcome: Adequate for Discharge   Problem: Coping: Goal: Level of anxiety will decrease Outcome: Adequate for Discharge   Problem: Elimination: Goal: Will not experience complications related to bowel motility Outcome: Adequate for Discharge Goal: Will not experience complications related to urinary retention Outcome: Adequate for Discharge   Problem: Pain Managment: Goal: General experience of comfort will improve and/or be controlled Outcome: Adequate for Discharge   Problem: Safety: Goal: Ability to remain free from injury will improve Outcome: Adequate for Discharge   Problem: Skin Integrity: Goal: Risk for impaired skin integrity will decrease Outcome: Adequate for Discharge   Problem: Education: Goal: Knowledge of disease or condition will improve Outcome: Adequate for Discharge Goal: Knowledge of secondary prevention will improve (MUST DOCUMENT ALL) Outcome: Adequate for Discharge Goal:  Knowledge of patient specific risk factors will improve (DELETE if not current risk factor) Outcome: Adequate for Discharge   Problem: Intracerebral Hemorrhage Tissue Perfusion: Goal: Complications of Intracerebral Hemorrhage will be minimized Outcome: Adequate for Discharge   Problem: Coping: Goal: Will verbalize positive feelings about self Outcome: Adequate for Discharge Goal: Will identify appropriate support needs Outcome: Adequate for Discharge   Problem: Health Behavior/Discharge Planning: Goal: Ability to manage health-related needs will improve Outcome: Adequate for Discharge Goal: Goals will be collaboratively established with patient/family Outcome: Adequate for Discharge   Problem: Self-Care: Goal: Ability to participate in self-care as condition permits will improve Outcome: Adequate for Discharge Goal: Verbalization of feelings and concerns over difficulty with self-care will improve Outcome: Adequate for Discharge Goal: Ability to communicate needs accurately will improve Outcome: Adequate for Discharge   Problem: Nutrition: Goal: Risk of aspiration will decrease Outcome: Adequate for Discharge Goal: Dietary intake will improve Outcome: Adequate for Discharge   Problem: Education: Goal: Ability to describe self-care measures that may prevent or decrease complications (Diabetes Survival Skills Education) will improve Outcome: Adequate for Discharge Goal: Individualized Educational Video(s) Outcome: Adequate for Discharge   Problem: Coping: Goal: Ability to adjust to condition or change in health will improve Outcome: Adequate for Discharge   Problem: Fluid Volume: Goal: Ability to maintain a balanced intake and output will improve Outcome: Adequate for Discharge   Problem: Health Behavior/Discharge Planning: Goal: Ability to identify and utilize available resources and services will improve Outcome: Adequate for Discharge Goal: Ability to manage  health-related needs will improve Outcome: Adequate for Discharge   Problem: Metabolic: Goal: Ability to maintain appropriate glucose levels will improve Outcome: Adequate for Discharge   Problem: Nutritional: Goal: Maintenance of adequate nutrition will improve Outcome: Adequate for Discharge Goal: Progress toward achieving an optimal weight will improve Outcome: Adequate for Discharge   Problem: Skin Integrity: Goal: Risk for impaired skin integrity will decrease Outcome: Adequate for Discharge   Problem: Tissue Perfusion: Goal:  Adequacy of tissue perfusion will improve Outcome: Adequate for Discharge   "

## 2024-04-21 NOTE — Progress Notes (Signed)
 AVS placed in DC packet.  IV removed.  Report called to receiving facility.

## 2024-04-21 NOTE — Discharge Summary (Signed)
 [facility] Discharge Summary   Patient: Jacob SHEHADEH FMW:[id number]  PCP: [name], MD  Date of admission: 04/07/2024   Date of discharge:  04/21/2024     Discharge Diagnoses:  Principal Problem:   Intraventricular hemorrhage (HCC) Active Problems:   Pressure injury of skin   Protein-calorie malnutrition, severe   Admitted From: SNF Disposition:  SNF   Recommendations for Outpatient Follow-up:  Follow with PCP, patient needs to be seen by an MD in 1 to 2 days. Follow up LABS/TEST:  Repeat BMP in 1 week, encourage for oral hydration.  Monitor urine output.   Contact information for after-discharge care     Destination     [facility] .   Service: Skilled Nursing Contact information: [address] [place]  [address] [phone]                    Diet recommendation: Dysphagia type 1 Thin Liquid  Activity: The patient is advised to gradually reintroduce usual activities, as tolerated  Discharge Condition: stable  Code Status: DNR -Limited  History of present illness: As per the H and P dictated on admission.  Hospital Course:   84 y.o. M with dementia, lives at home, HTN, DM, malnutrition, CKD IV baseline 1.9-2.1 who presented with small intracranial hemorrhage.  This remains stable.  Hospital course is notable for poor intake, dehydration, altered mentation likely delirium.    Assessment & Plan:   # Nontraumatic small subarachnoid hemorrhage # Small intraventricular hemorrhage # Small subdural hematoma Patient had had fall several days before Christmas.  Came to ER on Christmas for flank pain, found to have kidney stone.  Over next few days, family noticed he seemed more off, weaker, brought him to the ER. There, CT head 12/29 revealed small subdural hematoma, subarachnoid hemorrhage which remained stable on follow-up CT same day. - Head CT was repeated 1/4 due to increasing  somnolence.  Shows decreasing intraventricular hemorrhage and unchanged mild hydrocephalus. - Neurology saw the patient earlier in the course and has signed off - 04/15/2024-More left usually.  No obvious medications.  No sedatives.  Repeat head CT to make sure he is not developing an increasing obstructive  hydrocephalus. 04/17/24-patient is slightly more alert  but really for the most part he somnolent all day.  Per nursing he did eat and take his medications.  I discussed with neurology we will try modafinil  to see if it makes a difference in his alertness during the day.  I am concerned about sending him to rehab and him not being alert enough to participate in to eat. 04/18/24: Patient seems marginally more alert.  He will open his eyes he will certainly answer more questions.  Nurse assistant reports he is eating. 04/19/24: Patient seen earlier this morning on rounds.  Son [name] was in the room.  Patient is definitely more awake and alert more interactive.  He did does still tend to nod off fairly frequently but he was much more awake then yesterday.  Son stated he ate 100% of his breakfast.  No choking.  Not sure which one is helping but we will continue the Remeron and the Provigil .  Given that he is eating better and is more awake I think he could transfer to rehab on Monday.  Discussed with his daughter [name] by telephone.   EEG unremarkable, echocardiogram showed severe LVH, but no embolic source LDL 11, hemoglobin A1c 8.3% PT/OT recommend SNF placement. -  Avoid aspirin  - Continue Lipitor, Keppra    # Acute encephalopathy/delirium: Exacerbated by brain bleed/dementia.,  dehydration.  Hypoactive/somnolent with periods of alertness - Continue delirium precautions.  Resumed Aricept  on discharge. - Avoid deliriogenic medications.   # Suspected aspiration pneumonia Fever 1/2.  Not able to articulate any symptoms.  Denies cough, chest pain, dysuria, suprapubic pain.  Given his poor prognosis,  rather than get chest x-ray, I will treat aspiration empirically. - s/p Cefadroxil  twice daily start 04/11/24 for 5 days.  Completed course   # Severe protein calorie malnutrition - Continue feeding supplements - Recommend palliative care to follow at SNF - Continue remeron as stimulate appetite, may consider to discontinue once appetite improves   # Chronic kidney disease stage IV Creatinine stable relative to baseline, suspect GFR significantly lower than predicted baseline creatinine given his severe muscle wasting.   # Severe LVH # Small pericardial effusion This is an incidental finding in the hospital on echocardiogram.  Cardiology were consulted, they recommended outpatient follow-up - Follow-up with cardiologist outpatient, pending goals of care    # Cerebrovascular disease # Hypertension # Hyperlipidemia Blood pressure high normal - Continue amlodipine  and Lipitor   # History of seizures: Continue Keppra  # Diabetes with hyperglycemia and chronic kidney disease Glucose mostly controlled in the last 24 hours - s/p sliding scale corrections - Hold home degludec/glargine, and Januvia  Resumed insulin  degludec as needed home dose, monitor CBG.   # Anemia # Thrombocytopenia # Elevated serum free light chains Follows with Dr. Davonna at the [facility] cancer center.  He is not a candidate for chemotherapy, and so decision to biopsy his anemia, thrombocytopenia, and elevated free light chains has been deferred.  Platelets 61K on admission, transfused platelets, increased to 117 here.   Certainly may just be anemia of chronic kidney disease   # Nephrolithiasis Kidney stone found week prior to admission, asymptomatic at present.  Microscopic hematuria on UA is from that, infection ruled out   Dysphagia: PLAN: Continue dysphagia 1/pureed diet with thin liquids.  Full supervision, assist with feeding due to inattention.  Rec SLP f/u at SNF to address potential for diet to be advanced.   He appears to be protecting airway, but dementia and new neuro deficits are impacting initiative to eat and to eat regular solid foods.  Continue Remeron as appetite stimulant and Provigil  as needed for somnolence. Advance diet as per improvement.   Body mass index is 14.99 kg/m.  Nutrition Problem: Severe Malnutrition Etiology: chronic illness Nutrition Interventions: Interventions: Refer to RD note for recommendations, Ensure Enlive (each supplement provides 350kcal and 20 grams of protein)   - Patient was instructed, not to drive, operate heavy machinery, perform activities at heights, swimming or participation in water  activities or provide baby sitting services while on Pain, Sleep and Anxiety Medications; until his outpatient Physician has advised to do so again.  - Also recommended to not to take more than prescribed Pain, Sleep and Anxiety Medications.  Patient was seen by physical therapy, who recommended Therapy, SNF placement, which was arranged. On the day of the discharge the patient's vitals were stable, and no other acute medical condition were reported by patient. the patient was felt safe to be discharge at SNF with Therapy.  Consultants: Neurology and cardiology Procedures: None  Discharge Exam: General: Appear in no distress, Oral Mucosa Clear, moist. Cardiovascular: S1 and S2 Present, no Murmur, Respiratory: normal respiratory effort, Bilateral Air entry present and no Crackles, no wheezes Abdomen: Bowel Sound present,  Soft and no tenderness. Extremities: no Pedal edema, no calf tenderness Neurology: Generalized weakness, no focal deficits. affect appropriate.  Filed Weights   04/07/24 0949 04/07/24 2020 04/15/24 1140  Weight: 42.3 kg 39.2 kg 39.6 kg   Vitals:   04/21/24 0516 04/21/24 0820  BP: 122/72 92/61  Pulse: 61 71  Resp: 18 18  Temp: 98.2 F (36.8 C) 97.8 F (36.6 C)  SpO2: 100% 100%    DISCHARGE MEDICATION: Allergies as of 04/21/2024        Reactions   Alpha-gal         Medication List     STOP taking these medications    copper  tablet   FreeStyle Libre 2 Sensor Misc   Lantus  SoloStar 100 UNIT/ML Solostar Pen Generic drug: insulin  glargine   Potassium 99 MG Tabs   sitaGLIPtin  100 MG tablet Commonly known as: Januvia        TAKE these medications    acetaminophen  650 MG CR tablet Commonly known as: TYLENOL  Take 650 mg by mouth every 8 (eight) hours as needed for pain.   amLODipine  2.5 MG tablet Commonly known as: NORVASC  Take 2.5 mg by mouth daily.   atorvastatin  10 MG tablet Commonly known as: LIPITOR GIVE 1 TABLET BY MOUTH AT BEDTIME EVERY MONDAY/WEDNESDAY/FRIDAY FOR HIGH CHOLESTEROL   blood glucose meter kit and supplies Dispense based on patient and insurance preference. Use up to two times daily as directed. (FOR ICD-10 E10.9, E11.9).   CALCIUM -VITAMIN D  PO Take by mouth. What changed: when to take this   donepezil  10 MG tablet Commonly known as: ARICEPT  TAKE 1 TABLET BY MOUTH EVERYDAY AT BEDTIME   dorzolamide -timolol  2-0.5 % ophthalmic solution Commonly known as: COSOPT  Place 1 drop into both eyes 2 (two) times daily.   Insulin  Degludec 100 UNIT/ML Soln Inject 6-7 Units into the skin daily as needed (For blood sugar levels between 280-300).   latanoprost  0.005 % ophthalmic solution Commonly known as: XALATAN  Place 1 drop into both eyes at bedtime.   levETIRAcetam  500 MG tablet Commonly known as: KEPPRA  TAKE 1 TABLET BY MOUTH TWICE A DAY   mirtazapine 15 MG tablet Commonly known as: REMERON Take 15 mg by mouth.   modafinil  100 MG tablet Commonly known as: PROVIGIL  Take 1 tablet (100 mg total) by mouth daily as needed (For somnolence).   Pen Needles 31G X 6 MM Misc 1 each by Does not apply route at bedtime.   tamsulosin  0.4 MG Caps capsule Commonly known as: FLOMAX  TAKE 1 CAPSULE BY MOUTH EVERY DAY AFTER SUPPER What changed: See the new instructions.   Vitamin D -3 25  MCG (1000 UT) Caps Take 1 capsule by mouth daily.       Allergies[1] Discharge Instructions     Call MD for:   Complete by: As directed    Any weakness or numbness, any new neurological changes.   Call MD for:  difficulty breathing, headache or visual disturbances   Complete by: As directed    Call MD for:  extreme fatigue   Complete by: As directed    Call MD for:  persistant dizziness or light-headedness   Complete by: As directed    Call MD for:  persistant nausea and vomiting   Complete by: As directed    Call MD for:  severe uncontrolled pain   Complete by: As directed    Call MD for:  temperature >100.4   Complete by: As directed    Discharge instructions   Complete by:  As directed    Follow with PCP, patient needs to be seen by an MD in 1 to 2 days. Repeat BMP in 1 week, encourage for oral hydration.  Monitor urine output.   Increase activity slowly   Complete by: As directed    No wound care   Complete by: As directed        The results of significant diagnostics from this hospitalization (including imaging, microbiology, ancillary and laboratory) are listed below for reference.    Significant Diagnostic Studies: CT HEAD WO CONTRAST ( ) Result Date: 04/15/2024 EXAM: CT HEAD WITHOUT CONTRAST 04/15/2024 02:44:23 PM TECHNIQUE: CT of the head was performed without the administration of intravenous contrast. Automated exposure control, iterative reconstruction, and/or weight based adjustment of the mA/kV was utilized to reduce the radiation dose to as low as reasonably achievable. COMPARISON: Head CT 04/13/2024 and MRI 04/08/2024. CLINICAL HISTORY: Hydrocephalus; ICH with intraventicular blood, more lethargic, progressive hydrocephalus? FINDINGS: BRAIN AND VENTRICLES: There is small volume residual hemorrhage in the lateral ventricles, decreased from prior. Mild ventriculomegaly is unchanged from the prior CT. No definite acute large territory infarct is identified,  however motion, streak artifact, and image noise reduce the sensitivity for detection of early infarct. No new intracranial hemorrhage, mass, midline shift, or extra-axial fluid collection is identified. Cerebral atrophy and chronic small vessel ischemic white matter disease are again noted. Calcified atherosclerosis at the skull base. Prior left middle meningeal artery embolization. ORBITS: Bilateral cataract extraction. SINUSES: No acute abnormality. SOFT TISSUES AND SKULL: No acute soft tissue abnormality. No skull fracture. IMPRESSION: 1. Decreasing small volume residual intraventricular hemorrhage. Unchanged moderate hydrocephalus. 2. No definite new intracranial abnormality. Electronically signed by: Dasie Hamburg MD 04/15/2024 03:19 PM EST RP Workstation: [id number]   CT HEAD WO CONTRAST ( ) Result Date: 04/14/2024 EXAM: CT HEAD WITHOUT 04/13/2024 08:12:15 PM TECHNIQUE: CT of the head was performed without the administration of intravenous contrast. Automated exposure control, iterative reconstruction, and/or weight based adjustment of the mA/kV was utilized to reduce the radiation dose to as low as reasonably achievable. COMPARISON: CT head 04/07/2024 CLINICAL HISTORY: Altered mental status, nontraumatic (Ped 0-17y); f/u brain bleeding FINDINGS: BRAIN AND VENTRICLES: Decreased intraventricular hemorrhage with some redistribution of hemorrhage into the occipital horns. Patchy white matter hypodensities, compatible with chronic microvascular ischemic change. No mass effect or midline shift. No extra-axial fluid collection. No evidence of acute infarct. Unchanged mild hydrocephalus. ORBITS: No acute abnormality. SINUSES AND MASTOIDS: No acute abnormality. SOFT TISSUES AND SKULL: No acute skull fracture. No acute soft tissue abnormality. IMPRESSION: 1. Decreased intraventricular hemorrhage. 2. Unchanged mild hydrocephalus. Electronically signed by: Gilmore Molt 04/14/2024 01:27 AM EST RP Workstation:  [id number]   ECHOCARDIOGRAM COMPLETE Result Date: 04/08/2024    ECHOCARDIOGRAM REPORT   Patient Name:   Jacob Barnes Date of Exam: 04/08/2024 Medical Rec #:  [id number]   Height:       64.0 in Accession #:    [id number]  Weight:       86.4 lb Date of Birth:  13-Jan-1941   BSA:          1.370 m Patient Age:    83 years    BP:           144/90 mmHg Patient Gender: M           HR:           80 bpm. Exam Location:  Inpatient Procedure: 2D Echo, Cardiac Doppler and Color Doppler (Both Spectral and Color  Flow Doppler were utilized during procedure). Indications:    Stroke  History:        Patient has no prior history of Echocardiogram examinations.                 Stroke, Signs/Symptoms:Edema; Risk Factors:Hypertension,                 Diabetes and Dyslipidemia.  Sonographer:    Juliene Rucks Referring Phys: [id number] [facility] KHALIQDINA IMPRESSIONS  1. 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 Left ventricular ejection fraction, by estimation, is 50 to 55%. The left ventricle has low normal function. The left ventricle has no regional wall motion abnormalities. There is severe concentric left ventricular hypertrophy. Left ventricular diastolic parameters are consistent with Grade II diastolic dysfunction (pseudonormalization).  2. Right ventricular systolic function is normal. The right ventricular size is normal. Severely increased right ventricular wall thickness.  3. A small pericardial effusion is present.  4. The mitral valve is normal in structure. No evidence of mitral valve regurgitation. No evidence of mitral stenosis.  5. The aortic valve is normal in structure. There is moderate thickening of the aortic valve. Aortic valve regurgitation is mild. Aortic valve sclerosis is present, with no evidence of aortic valve stenosis.  6. The inferior vena cava is normal in size with greater than 50% respiratory  variability, suggesting right atrial pressure of 3 mmHg. 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 Left ventricular ejection fraction, by estimation, is 50 to 55%. The left ventricle has low normal function. The left ventricle has no regional wall motion abnormalities. The left ventricular internal cavity size was normal in size. There is severe concentric left ventricular hypertrophy. Left ventricular diastolic parameters are consistent with Grade II diastolic dysfunction (pseudonormalization). Right Ventricle: The right ventricular size is normal. Severely increased right ventricular wall thickness. Right ventricular systolic function is normal. Left Atrium: Left atrial size was normal in size. Right Atrium: Right atrial size was normal in size. Pericardium: A small pericardial effusion is present. Mitral Valve: The mitral valve is normal in structure. No evidence of mitral valve regurgitation. No evidence of mitral valve stenosis. Tricuspid Valve: The tricuspid valve is normal in structure. Tricuspid valve regurgitation is not demonstrated. No evidence of tricuspid stenosis. Aortic Valve: The aortic valve is normal in structure. There is moderate thickening of the aortic valve. Aortic valve regurgitation is mild. Aortic valve sclerosis is present, with no evidence of aortic valve stenosis. Pulmonic Valve: The pulmonic valve was normal in structure. Pulmonic valve regurgitation is not visualized. No evidence of pulmonic stenosis. Aorta: The aortic root is normal in size and structure. Venous: The inferior vena cava is normal in size with greater than 50% respiratory variability, suggesting right atrial pressure of 3 mmHg. IAS/Shunts: The interatrial septum was not well visualized.  LEFT VENTRICLE PLAX 2D LVIDd:         2.90 cm   Diastology LVIDs:         2.30 cm   LV e' medial:    3.81 cm/s  LV PW:         1.60 cm   LV E/e' medial:  16.5 LV IVS:        1.90 cm   LV e' lateral:   5.22 cm/s LVOT diam:     1.80 cm   LV E/e' lateral: 12.0 LV SV:  28 LV SV Index:   20 LVOT Area:     2.54 cm  RIGHT VENTRICLE RV S prime:     9.57 cm/s TAPSE (M-mode): 1.1 cm LEFT ATRIUM             Index        RIGHT ATRIUM          Index LA diam:        2.00 cm 1.46 cm/m   RA Area:     9.90 cm LA Vol (A2C):   22.6 ml 16.50 ml/m  RA Volume:   21.00 ml 15.33 ml/m LA Vol (A4C):   11.4 ml 8.32 ml/m LA Biplane Vol: 16.9 ml 12.34 ml/m  AORTIC VALVE LVOT Vmax:   61.60 cm/s LVOT Vmean:  39.400 cm/s LVOT VTI:    0.110 m  AORTA Ao Root diam: 3.30 cm MITRAL VALVE MV Area (PHT): 3.91 cm    SHUNTS MV Decel Time: 194 msec    Systemic VTI:  0.11 m MV E velocity: 62.90 cm/s  Systemic Diam: 1.80 cm MV A velocity: 88.00 cm/s MV E/A ratio:  0.71 Morene Brownie Electronically signed by Morene Brownie Signature Date/Time: 04/08/2024/11:57:51 AM    Final    EEG adult Result Date: 04/08/2024 Shelton Arlin KIDD, MD     04/08/2024  9:34 AM Patient Name: Jacob Barnes MRN: [id number] Epilepsy Attending: Arlin KIDD Shelton Referring Physician/Provider: Vanessa Robert, MD Date: 04/08/2024 Duration: 23.20 mins Patient history: 84yo M with acute L choroid plexus IVH in the setting of thrombocytopenia. EEG to evaluate for seizure Level of alertness: Awake, asleep AEDs during EEG study: LEV Technical aspects: This EEG study was done with scalp electrodes positioned according to the 10-20 International system of electrode placement. Electrical activity was reviewed with band pass filter of 1-70Hz , sensitivity of 7 uV/mm, display speed of 38mm/sec with a 60Hz  notched filter applied as appropriate. EEG data were recorded continuously and digitally stored.  Video monitoring was available and reviewed as appropriate. Description: The posterior dominant rhythm consists of 8-9Hz  activity of moderate voltage (25-35 uV) seen predominantly in  posterior head regions, symmetric and reactive to eye opening and eye closing. Sleep was characterized by vertex waves, sleep spindles (12 to 14 Hz), maximal frontocentral region.  There is intermittent generalized 3 to 6 Hz theta-delta slowing. Hyperventilation and photic stimulation were not performed.   ABNORMALITY - Intermittent slow, generalized IMPRESSION: This study is suggestive of mild diffuse encephalopathy. No seizures or epileptiform discharges were seen throughout the recording. [name]   MR BRAIN W WO CONTRAST Result Date: 04/08/2024 EXAM: MRI BRAIN WITH AND WITHOUT CONTRAST 04/08/2024 04:47:00 AM TECHNIQUE: Multiplanar multisequence MRI of the head/brain was performed with and without the administration of intravenous contrast. CONTRAST: 4 mL of Gadavist . COMPARISON: MR Head 04/07/2024. CLINICAL HISTORY: Neuro deficit, acute, stroke suspected. FINDINGS: BRAIN AND VENTRICLES: No acute infarct. There has been decrease of intraventricular hemorrhage within the posterior horn of the right lateral ventricle. There has been mild interval worsening of diffuse subarachnoid hemorrhage, likely from redistribution. The volume of intraventricular hemorrhage within the posterior horn of the left lateral ventricle has not changed significantly. No mass effect or midline shift. No hydrocephalus. The sella is unremarkable. Normal flow voids. No mass or abnormal enhancement. There is age-related atrophy and moderately advanced cerebral white matter disease. This study is degraded by patient motion. ORBITS: No acute abnormality. SINUSES: No acute abnormality. BONES AND SOFT TISSUES: Normal bone marrow signal and enhancement. No  acute soft tissue abnormality. IMPRESSION: 1. Mild interval worsening of diffuse subarachnoid hemorrhage, likely from redistribution. 2. Decreased intraventricular hemorrhage within the posterior horn of the right lateral ventricle. 3. Stable intraventricular hemorrhage within the  posterior horn of the left lateral ventricle. 4. No definite evidence of underlying mass. 5. Age-related atrophy and moderately advanced cerebral white matter disease. 6. Motion degraded examination. Electronically signed by: Evalene Coho MD 04/08/2024 06:35 AM EST RP Workstation: [id number]   CT VENOGRAM HEAD Result Date: 04/07/2024 EXAM: CTA Head with Intravenous Contrast CLINICAL HISTORY: Dural venous sinus thrombosis suspected. TECHNIQUE: Axial CTA images of the head with intravenous contrast. MIP reconstructed images were created and reviewed. Dose reduction technique was used including one or more of the following: automated exposure control, adjustment of mA and kV according to patient size, and/or iterative reconstruction. CONTRAST: With; 75 mL (iohexol  (OMNIPAQUE ) 350 MG/ML injection 75 mL IOHEXOL  350 MG/ML SOLN). COMPARISON: 04/07/2024 FINDINGS: INTERNAL CAROTID ARTERIES: The intracranial ICAs are patent with no significant stenosis. No occlusion. No aneurysm. ANTERIOR CEREBRAL ARTERIES: No significant stenosis. No occlusion. No aneurysm. MIDDLE CEREBRAL ARTERIES: No significant stenosis. No occlusion. No aneurysm. POSTERIOR CEREBRAL ARTERIES: No significant stenosis. No occlusion. No aneurysm. BASILAR ARTERY: No significant stenosis. No occlusion. No aneurysm. VERTEBRAL ARTERIES: No significant stenosis. No occlusion. No aneurysm. SOFT TISSUES: No acute finding. No masses or lymphadenopathy. BONES: No acute osseous abnormality. BRAIN: Unchanged appearance of intraventricular hematoma within the left lateral ventricle atrium. IMPRESSION: 1. No evidence of dural venous sinus thrombosis. 2. Unchanged appearance of intraventricular hematoma within the left lateral ventricle atrium. Electronically signed by: Franky Stanford MD 04/07/2024 10:57 PM EST RP Workstation: [id number]   CT HEAD POST STROKE FOLLOWUP/TIMED/STAT READ Result Date: 04/07/2024 EXAM: CT HEAD WITHOUT 04/07/2024 10:39:48 PM TECHNIQUE:  CT of the head was performed without the administration of intravenous contrast. Automated exposure control, iterative reconstruction, and/or weight based adjustment of the mA/kV was utilized to reduce the radiation dose to as low as reasonably achievable. COMPARISON: 04/07/2024 CLINICAL HISTORY: Neuro deficit, acute, stroke suspected FINDINGS: BRAIN AND VENTRICLES: Unchanged hemorrhage within the left lateral ventricle. Unchanged mild communicating hydrocephalus. Resolution of old left sided subdural hematoma. No new site of hemorrhage. No evidence of acute infarct. ORBITS: No acute abnormality. SINUSES AND MASTOIDS: No acute abnormality. SOFT TISSUES AND SKULL: No acute skull fracture. No acute soft tissue abnormality. IMPRESSION: 1. Unchanged hemorrhage within the left lateral ventricle. 2. Unchanged mild communicating hydrocephalus. Electronically signed by: Franky Stanford MD 04/07/2024 10:55 PM EST RP Workstation: [id number]   CT ANGIO HEAD NECK W WO CM Result Date: 04/07/2024 EXAM: CTA HEAD AND NECK WITH AND WITHOUT 04/07/2024 06:15:51 PM TECHNIQUE: CTA of the head and neck was performed with and without the administration of intravenous contrast. 60 mL (iohexol  (OMNIPAQUE ) 350 MG/ML injection 60 mL IOHEXOL  350 MG/ML SOLN) was administered. Multiplanar 2D and/or 3D reformatted images are provided for review. Automated exposure control, iterative reconstruction, and/or weight based adjustment of the mA/kV was utilized to reduce the radiation dose to as low as reasonably achievable. Stenosis of the internal carotid arteries measured using NASCET criteria. COMPARISON: CT head and CT cervical spine dated 04/07/2024. CTA head and neck dated 03/19/2022. CLINICAL HISTORY: Stroke, hemorrhagic. FINDINGS: CTA NECK: AORTIC ARCH AND ARCH VESSELS: Atherosclerosis at the left subclavian artery origin without significant stenosis. Atherosclerosis of the partially visualized aortic arch. No dissection or arterial injury. No  significant stenosis of the brachiocephalic arteries. CERVICAL CAROTID ARTERIES: Minimal atherosclerosis along the proximal right internal carotid  artery without stenosis. Mild tortuosity of the distal right cervical ICA. Atherosclerosis at the left carotid bifurcation without hemodynamically significant stenosis. No dissection or arterial injury. CERVICAL VERTEBRAL ARTERIES: The right vertebral artery is dominant. Extradural origin of the right PICA. Diminished post PICA segment of the nondominant left vertebral artery. No dissection, arterial injury, or significant stenosis. LUNGS AND MEDIASTINUM: Unremarkable. SOFT TISSUES: Edentulous maxilla and mandible. No acute abnormality. BONES: Degenerative changes in the visualized spine. No acute abnormality. CTA HEAD: ANTERIOR CIRCULATION: Mild atherosclerosis of the carotid siphons. There is mild stenosis of the right cavernous ICA. Tortuosity of the cavernous ICAs bilaterally, greater on the right. No significant stenosis of the anterior cerebral arteries. No significant stenosis of the middle cerebral arteries. No aneurysm. POSTERIOR CIRCULATION: There is distal tapering of the basilar artery. Small P1 segments noted bilaterally. The posterior cerebral arteries are primarily supplied via the posterior communicating arteries. No significant stenosis of the posterior cerebral arteries. No significant stenosis of the basilar artery. No significant stenosis of the vertebral arteries. No aneurysm. OTHER: No dural venous sinus thrombosis on this non-dedicated study. IMPRESSION: 1. No large vessel occlusion or aneurysm in the head or neck. 2. Mild atherosclerosis as above. No high-grade stenosis. Electronically signed by: Donnice Mania MD 04/07/2024 08:03 PM EST RP Workstation: [id number]   MR BRAIN WO CONTRAST Result Date: 04/07/2024 EXAM: MRI BRAIN WITHOUT CONTRAST 04/07/2024 01:58:05 PM TECHNIQUE: Multiplanar multisequence MRI of the head/brain was performed without  the administration of intravenous contrast. COMPARISON: CT head 07/05/2022 in correlation with subsequently performed CT head 04/07/2024 2:05 pm. CLINICAL HISTORY: Neuro deficit, acute, stroke suspected. FINDINGS: BRAIN AND VENTRICLES: Increased DWI signal with suggestion of corresponding decreased ADC signal in the left periventricular white matter (series 6, image 32). Additional focus of suggested restricted diffusion in the left corpus callosum (series 5, image 28) adjacent to the left lateral ventricle. Mild parenchymal volume loss. Mild to moderate subcortical and periventricular T2 and FLAIR signal hyperintensity likely reflecting sequelae of chronic microvascular ischemia. Intraventricular hemorrhage involving the left choroid plexus. Layering hemorrhage within the right greater than left bilateral occipital horns. Increased ventricular caliber since prior CT. Decreased size of a 2 mm left parietal convexity extra-axial fluid collection. Small foci of FLAIR hyperintensity in the right temporal extra-axial region may represent small subdural hemorrhage. Susceptibility within the bilateral sylvian fissures and adjacent cerebral sulci in keeping with subarachnoid hemorrhage. Scattered foci of FLAIR hyperintensity and susceptibility within the cerebellar fissures also likely representing foci of subarachnoid hemorrhage. FLAIR hyperintensity involving the left transverse dural venous sinus which may relate to slow flow though thrombosis is not excluded. No midline shift. The sella is unremarkable. Normal flow voids. ORBITS: No acute abnormality. SINUSES AND MASTOIDS: No acute abnormality. BONES AND SOFT TISSUES: Normal marrow signal. No acute soft tissue abnormality. IMPRESSION: 1. Intraventricular hemorrhage involving the left choroid plexus with layering hemorrhage in the right greater than left bilateral occipital horns. This could be due to an intraventricular tumor/ metastasis, vascular malformation, or  hypertension. MRI brain with IV contrast would be useful to further assess. 2. Increased ventricular caliber may signify a component of hydrocephalus. 3. Left transverse dural venous sinus signal abnormality may reflect slow flow, though dural venous sinus thrombosis is not excluded. MRV would be useful to further assess. 4. Questionable foci of restricted diffusion in the left periventricular white matter and left corpus callosum could represent ischemic infarction or cytotoxic/transependymal edema given adjacent choroid plexus hemorrhage. 5. Scattered subarachnoid hemorrhage may represent redistribution of  intraventricular hemorrhage. 6. Possible trace right temporal convexity foci of extra-axial hemorrhage. 7. Decreased size of 2 mm left parietal convexity chronic subdural hematoma. Electronically signed by: Prentice Spade MD 04/07/2024 03:51 PM EST RP Workstation: [id number]   CT Head Wo Contrast Result Date: 04/07/2024 EXAM: CT HEAD WITHOUT CONTRAST 04/07/2024 02:21:09 PM TECHNIQUE: CT of the head was performed without the administration of intravenous contrast. Automated exposure control, iterative reconstruction, and/or weight based adjustment of the mA/kV was utilized to reduce the radiation dose to as low as reasonably achievable. COMPARISON: CT head 10/05/2022. MRI brain 04/07/2024 01:41:00 PM. CLINICAL HISTORY: Head trauma, minor (Age >= 65y). FINDINGS: BRAIN AND VENTRICLES: Left middle meningeal artery embolization. Overall similar moderate scattered white matter hypodensities which are nonspecific but most commonly represent chronic microvascular ischemic changes. Significantly decreased size of left cerebral convexity hypoattenuating extra-axial fluid collection now measuring 1 to 2 mm, overlying the parietal lobe. Intraventricular hemorrhage centered in the region of the left choroid plexus measuring up to 2.7 x 2.2 cm in the axial plane with smaller adjacent foci of intraventricular hemorrhage,  and layering hemorrhage in the right greater than left bilateral occipital horns. The temporal horns appear slightly enlarged since prior CT. Trace hyperattenuating foci in the right temporal extra-axial region which may also represent small subdural hemorrhage (series 2, image 38). Trace hyperattenuation within the cerebral sulci along the left sylvian fissure representing scattered subarachnoid hemorrhage. No evidence of acute territorial infarct. No significant mass effect or midline shift. ORBITS: Bilateral lens replacement. SINUSES: No acute abnormality. SOFT TISSUES AND SKULL: No acute soft tissue abnormality. No skull fracture. IMPRESSION: 1. Intraventricular hemorrhage centered in the left choroid plexus with layering hemorrhage in the right greater than left bilateral occipital horns. Increased temporal horn caliber since prior CT may signify hydrocephalus. 2. The above finding was communicated to Dr. Bernardino Fireman by phone at 2:33 PM. 3. Trace right temporal extra-axial hemorrhage compatible with a small subdural hemorrhage. 4. Trace subarachnoid hemorrhage along the left sylvian fissures which may represent redistribution. 5. Decreased size of a 1-2 mm chronic left parietal convexity subdural hematoma. 6. No calvarial fracture. Electronically signed by: Prentice Spade MD 04/07/2024 03:14 PM EST RP Workstation: [id number]   CT Cervical Spine Wo Contrast Result Date: 04/07/2024 EXAM: CT CERVICAL SPINE WITHOUT CONTRAST 04/07/2024 02:21:09 PM TECHNIQUE: CT of the cervical spine was performed without the administration of intravenous contrast. Multiplanar reformatted images are provided for review. Automated exposure control, iterative reconstruction, and/or weight based adjustment of the mA/kV was utilized to reduce the radiation dose to as low as reasonably achievable. COMPARISON: None available. CLINICAL HISTORY: Neck trauma (Age >= 65y) FINDINGS: BONES AND ALIGNMENT: Cervical spine straightening. No  traumatic subluxation. No acute fracture. DEGENERATIVE CHANGES: Moderately advanced disc degeneration from C5 to T1. Mild upper cervical facet arthrosis. Right facet ankylosis at C2-C3. At least mild spinal stenosis at C5-C6, C6-C7, and C7-T1 with up to moderate neural foraminal stenosis. SOFT TISSUES: No prevertebral soft tissue swelling. Chest reported separately. IMPRESSION: 1. No acute cervical spine fracture or traumatic malalignment. Electronically signed by: Dasie Hamburg MD 04/07/2024 03:04 PM EST RP Workstation: [id number]   CT CHEST ABDOMEN PELVIS WO CONTRAST Result Date: 04/03/2024 CLINICAL DATA:  Blunt trauma. Patient reports left flank pain. Recent fall. EXAM: CT CHEST, ABDOMEN AND PELVIS WITHOUT CONTRAST TECHNIQUE: Multidetector CT imaging of the chest, abdomen and pelvis was performed following the standard protocol without IV contrast. RADIATION DOSE REDUCTION: This exam was performed according to the departmental dose-optimization program  which includes automated exposure control, adjustment of the mA and/or kV according to patient size and/or use of iterative reconstruction technique. COMPARISON:  Rib radiographs earlier today, abdominopelvic CT 02/14/2023 FINDINGS: CT CHEST FINDINGS Cardiovascular: The heart is normal in size. There are coronary artery calcifications. Aortic atherosclerosis. No perivascular stranding to suggest injury. Lack of contrast limits detailed assessment. Mediastinum/Nodes: No mediastinal hemorrhage. No mediastinal adenopathy. No pneumomediastinum. Unremarkable appearance of the esophagus. Lungs/Pleura: No pneumothorax. Tree-in-bud opacities in the left upper and left lower lobes. No pleural effusion. Mild emphysema. Musculoskeletal: The bones are subjectively under mineralized. No acute fracture of the ribs, sternum, included clavicles or shoulder girdles. Mild bilateral shoulder osteoarthritis. No acute fracture of the thoracic spine. There is no confluent body wall  contusion. Paucity of body fat. CT ABDOMEN PELVIS FINDINGS Noncontrast CT imaging in the setting of acute trauma significantly limits the detection of subtle though potentially significant traumatic findings. Hepatobiliary: No perihepatic hematoma. Homogeneous liver attenuation. Decompressed gallbladder. Pancreas: Not well assessed, but no evidence of injury. Spleen: No perisplenic hematoma.  Homogeneous attenuation. Adrenals/Urinary Tract: No adrenal hemorrhage. No evidence of renal injury or inflammation. 2 cm stone in the left renal pelvis. Additional punctate left intrarenal calculi. There is prominence of the left renal collecting system. No perinephric inflammation. Multiple right intrarenal calculi, largest in the lower pole measures 7 mm. No evidence of bladder injury or stone. Stomach/Bowel: Bowel assessment is limited in the absence of contrast and paucity of intra-abdominal fat. Allowing for this, there is no evidence of bowel injury. Small to moderate colonic stool burden. Vascular/Lymphatic: Aortic and branch atherosclerosis. No retroperitoneal stranding to suggest vascular injury. Limited assessment for adenopathy on the current exam. Reproductive: Prominent prostate. Other: No free air or free fluid. Musculoskeletal: The bones are under mineralized. Chronic severe L1 compression fracture, stable in appearance. Remote fracture of the right iliac bone. Undulation of the L5 superior endplate is chronic. Multilevel degenerative change in the spine. IMPRESSION: 1. No evidence of acute traumatic injury to the chest, abdomen, or pelvis allowing for limitations of noncontrast exam. 2. Tree-in-bud opacities in the left upper and left lower lobes, typical of infectious bronchiolitis. 3. Bilateral nephrolithiasis. There is a 2 cm stone in the left renal pelvis with prominence of the left renal collecting system. 4. Chronic severe L1 compression fracture. Remote right iliac bone fracture. Aortic Atherosclerosis  (ICD10-I70.0) and Emphysema (ICD10-J43.9). Electronically Signed   By: Andrea Gasman M.D.   On: 04/03/2024 22:34   DG Ribs Unilateral W/Chest Left Result Date: 04/03/2024 CLINICAL DATA:  Left lower rib pain. EXAM: LEFT RIBS AND CHEST - 3+ VIEW COMPARISON:  None Available. FINDINGS: No acute fracture or other bone lesions are seen involving the ribs. Remote healed fractures of lower ninth and tenth ribs. There is no evidence of pneumothorax or pleural effusion. Both lungs are clear. Heart size and mediastinal contours are within normal limits. IMPRESSION: No acute rib fracture or focal rib abnormality. Remote healed fractures of lower ninth and tenth ribs. Electronically Signed   By: Andrea Gasman M.D.   On: 04/03/2024 21:13    Microbiology: No results found for this or any previous visit (from the past 240 hours).   Labs: CBC: Recent Labs  Lab 04/17/24 0245 04/20/24 0212 04/21/24 0129  WBC 9.1 7.2 5.1  NEUTROABS 5.3 3.3  --   HGB 13.5 9.8* 9.8*  HCT 40.3 29.3* 29.6*  MCV 90.4 91.0 91.6  PLT 120* 118* 117*   Basic Metabolic Panel: Recent Labs  Lab 04/16/24 1359 04/17/24 0245 04/20/24 0212 04/20/24 1254 04/21/24 0129  NA 130* 132* 129*  --  131*  K 5.5* 4.6 4.8  --  5.1  CL 98 96* 96*  --  96*  CO2 26 28 26   --  30  GLUCOSE 169* 148* 154*  --  141*  BUN 27* 29* 30*  --  29*  CREATININE 1.59* 1.55* 1.68*  --  1.68*  CALCIUM  8.4* 9.1 8.3*  --  8.6*  MG  --   --   --  1.8 1.8  PHOS  --   --   --  3.0 3.4   Liver Function Tests: Recent Labs  Lab 04/17/24 0245 04/21/24 0129  AST 52* 29  ALT 46* 21  ALKPHOS 67 55  BILITOT 0.8 0.5  PROT 8.7* 7.3  ALBUMIN 4.1 3.5   No results for input(s): LIPASE, AMYLASE in the last 168 hours. No results for input(s): AMMONIA in the last 168 hours. Cardiac Enzymes: No results for input(s): CKTOTAL, CKMB, CKMBINDEX, TROPONINI in the last 168 hours. BNP (last 3 results) No results for input(s): BNP in the last  8760 hours. CBG: Recent Labs  Lab 04/20/24 1155 04/20/24 1612 04/20/24 2057 04/21/24 0651 04/21/24 1123  GLUCAP 117* 233* 133* 158* 162*    Time spent: 35 minutes  Signed:  Elvan Sor  [facility] 04/21/2024 11:41 AM       [1]  Allergies Allergen Reactions   Alpha-Gal

## 2024-04-25 ENCOUNTER — Inpatient Hospital Stay: Admitting: Oncology

## 2024-04-25 ENCOUNTER — Inpatient Hospital Stay

## 2024-04-28 ENCOUNTER — Telehealth: Payer: Self-pay | Admitting: Family Medicine

## 2024-04-28 NOTE — Telephone Encounter (Signed)
 Patient son Jacob Barnes came by office and asked for Dr Antonetta or her nurse reach out to him at [phone] regarding his father

## 2024-04-30 NOTE — Telephone Encounter (Signed)
 Lvm to cb.

## 2024-05-04 ENCOUNTER — Other Ambulatory Visit: Payer: Self-pay | Admitting: Family Medicine

## 2024-05-05 NOTE — Progress Notes (Unsigned)
" °  Cardiology Office Note:   Date:  05/05/2024  ID:  Jacob Barnes, DOB Oct 21, 1940, MRN [id number] PCP: [name], MD  [facility] Providers Cardiologist:  Lynwood Schilling, MD {  History of Present Illness:   Jacob Barnes is a 84 y.o. male with a hx of hypertension, hyperlipidemia, GERD, type 2 diabetes, anemia, osteoporosis, thrombocytopenia, prior left subdural hemorrhage, retinopathy,  who was seen 04/08/2024 in the hospital for the evaluation of abnormal echocardiogram.  Echo demonstrated severe LVH, diastolic dysfunction, pericardial effusion and the possibility of amyloid was raised.    I suspected clinically that this was likely.  However, he had severe acute and chronic conditions and we opted for conservative therapy and no further testing.  He was not having any symptoms consistent with volume overload.   He had a small pericardial effusion.   ***   ROS: ***  Studies Reviewed:    EKG:       ***  Risk Assessment/Calculations:   {Does this patient have ATRIAL FIBRILLATION?:[phone]} No BP recorded.  {Refresh Note OR Click here to enter BP  :1}***        Physical Exam:   VS:  There were no vitals taken for this visit.   Wt Readings from Last 3 Encounters:  04/15/24 87 lb 4.8 oz (39.6 kg)  04/03/24 93 lb 4.1 oz (42.3 kg)  02/22/24 93 lb 4.1 oz (42.3 kg)     GEN: Well nourished, well developed in no acute distress NECK: No JVD; No carotid bruits CARDIAC: ***RR, *** murmurs, rubs, gallops RESPIRATORY:  Clear to auscultation without rales, wheezing or rhonchi  ABDOMEN: Soft, non-tender, non-distended EXTREMITIES:  No edema; No deformity   ASSESSMENT AND PLAN:   ***    Pericardial effusion:  ***  LVH:  ***  HTN:  ***   Follow up ***  Signed, Lynwood Schilling, MD   "

## 2024-05-06 ENCOUNTER — Ambulatory Visit: Admitting: Cardiology

## 2024-05-08 ENCOUNTER — Telehealth: Payer: Self-pay

## 2024-05-08 NOTE — Telephone Encounter (Signed)
 Copied from CRM [id number]. Topic: General - Other >> May 08, 2024  8:29 AM Mia F wrote: Reason for CRM: Pt daughter [name] called stating she is returning a call from the office. Per CAL, none of the nurses tried calling. If pt is still needed, please call back

## 2024-05-14 ENCOUNTER — Ambulatory Visit: Admitting: Nurse Practitioner

## 2024-05-14 DIAGNOSIS — Z7984 Long term (current) use of oral hypoglycemic drugs: Secondary | ICD-10-CM

## 2024-05-14 DIAGNOSIS — I10 Essential (primary) hypertension: Secondary | ICD-10-CM

## 2024-05-14 DIAGNOSIS — Z794 Long term (current) use of insulin: Secondary | ICD-10-CM

## 2024-05-14 DIAGNOSIS — E1159 Type 2 diabetes mellitus with other circulatory complications: Secondary | ICD-10-CM

## 2024-05-14 DIAGNOSIS — E782 Mixed hyperlipidemia: Secondary | ICD-10-CM

## 2024-05-22 ENCOUNTER — Ambulatory Visit (INDEPENDENT_AMBULATORY_CARE_PROVIDER_SITE_OTHER): Admitting: Gastroenterology

## 2024-05-30 ENCOUNTER — Ambulatory Visit: Admitting: Family Medicine

## 2024-06-10 ENCOUNTER — Inpatient Hospital Stay: Admitting: Oncology

## 2024-06-10 ENCOUNTER — Inpatient Hospital Stay: Attending: Hematology

## 2024-06-10 ENCOUNTER — Inpatient Hospital Stay

## 2025-02-10 ENCOUNTER — Ambulatory Visit
